# Patient Record
Sex: Male | Born: 1935 | Race: White | Hispanic: No | Marital: Married | Smoking: Never smoker
Health system: Southern US, Community
[De-identification: ages and names within clinical notes are randomized; demographics above are authoritative.]

## PROBLEM LIST (undated history)

## (undated) DIAGNOSIS — N390 Urinary tract infection, site not specified: Secondary | ICD-10-CM

## (undated) DIAGNOSIS — I2510 Atherosclerotic heart disease of native coronary artery without angina pectoris: Secondary | ICD-10-CM

## (undated) DIAGNOSIS — I6523 Occlusion and stenosis of bilateral carotid arteries: Secondary | ICD-10-CM

## (undated) DIAGNOSIS — C8590 Non-Hodgkin lymphoma, unspecified, unspecified site: Secondary | ICD-10-CM

## (undated) DIAGNOSIS — S72002A Fracture of unspecified part of neck of left femur, initial encounter for closed fracture: Secondary | ICD-10-CM

## (undated) DIAGNOSIS — C801 Malignant (primary) neoplasm, unspecified: Secondary | ICD-10-CM

## (undated) DIAGNOSIS — C8313 Mantle cell lymphoma, intra-abdominal lymph nodes: Secondary | ICD-10-CM

## (undated) DIAGNOSIS — N200 Calculus of kidney: Secondary | ICD-10-CM

## (undated) DIAGNOSIS — B9620 Unspecified Escherichia coli [E. coli] as the cause of diseases classified elsewhere: Secondary | ICD-10-CM

## (undated) DIAGNOSIS — R059 Cough, unspecified: Secondary | ICD-10-CM

## (undated) DIAGNOSIS — E669 Obesity, unspecified: Secondary | ICD-10-CM

## (undated) DIAGNOSIS — D62 Acute posthemorrhagic anemia: Secondary | ICD-10-CM

## (undated) DIAGNOSIS — K4020 Bilateral inguinal hernia, without obstruction or gangrene, not specified as recurrent: Secondary | ICD-10-CM

## (undated) DIAGNOSIS — G4730 Sleep apnea, unspecified: Secondary | ICD-10-CM

## (undated) DIAGNOSIS — I071 Rheumatic tricuspid insufficiency: Secondary | ICD-10-CM

## (undated) DIAGNOSIS — E785 Hyperlipidemia, unspecified: Secondary | ICD-10-CM

## (undated) DIAGNOSIS — N19 Unspecified kidney failure: Secondary | ICD-10-CM

## (undated) DIAGNOSIS — C37 Malignant neoplasm of thymus: Secondary | ICD-10-CM

## (undated) DIAGNOSIS — M1990 Unspecified osteoarthritis, unspecified site: Secondary | ICD-10-CM

## (undated) DIAGNOSIS — R55 Syncope and collapse: Secondary | ICD-10-CM

## (undated) DIAGNOSIS — M129 Arthropathy, unspecified: Secondary | ICD-10-CM

## (undated) DIAGNOSIS — Z87898 Personal history of other specified conditions: Secondary | ICD-10-CM

## (undated) DIAGNOSIS — R609 Edema, unspecified: Secondary | ICD-10-CM

## (undated) DIAGNOSIS — I10 Essential (primary) hypertension: Secondary | ICD-10-CM

## (undated) DIAGNOSIS — R05 Cough: Secondary | ICD-10-CM

## (undated) DIAGNOSIS — Z9861 Coronary angioplasty status: Secondary | ICD-10-CM

## (undated) DIAGNOSIS — I5033 Acute on chronic diastolic (congestive) heart failure: Secondary | ICD-10-CM

## (undated) DIAGNOSIS — R578 Other shock: Secondary | ICD-10-CM

## (undated) DIAGNOSIS — I739 Peripheral vascular disease, unspecified: Secondary | ICD-10-CM

## (undated) HISTORY — PX: CARDIAC CATHETERIZATION: SHX172

## (undated) HISTORY — DX: Hyperlipidemia, unspecified: E78.5

## (undated) HISTORY — DX: Rheumatic tricuspid insufficiency: I07.1

## (undated) HISTORY — DX: Sleep apnea, unspecified: G47.30

## (undated) HISTORY — DX: Mantle cell lymphoma, intra-abdominal lymph nodes: C83.13

## (undated) HISTORY — DX: Unspecified kidney failure: N19

## (undated) HISTORY — DX: Calculus of kidney: N20.0

## (undated) HISTORY — DX: Peripheral vascular disease, unspecified: I73.9

## (undated) HISTORY — PX: CORONARY ARTERY BYPASS GRAFT: SHX141

## (undated) HISTORY — DX: Cough, unspecified: R05.9

## (undated) HISTORY — DX: Bilateral inguinal hernia, without obstruction or gangrene, not specified as recurrent: K40.20

## (undated) HISTORY — DX: Arthropathy, unspecified: M12.9

## (undated) HISTORY — DX: Coronary angioplasty status: Z98.61

## (undated) HISTORY — DX: Occlusion and stenosis of bilateral carotid arteries: I65.23

## (undated) HISTORY — DX: Unspecified osteoarthritis, unspecified site: M19.90

## (undated) HISTORY — DX: Malignant neoplasm of thymus: C37

## (undated) HISTORY — DX: Obesity, unspecified: E66.9

## (undated) HISTORY — DX: Essential (primary) hypertension: I10

## (undated) HISTORY — DX: Cough: R05

## (undated) HISTORY — DX: Personal history of other specified conditions: Z87.898

## (undated) HISTORY — DX: Edema, unspecified: R60.9

## (undated) HISTORY — DX: Atherosclerotic heart disease of native coronary artery without angina pectoris: I25.10

---

## 1998-08-04 ENCOUNTER — Ambulatory Visit (HOSPITAL_COMMUNITY): Admission: RE | Admit: 1998-08-04 | Discharge: 1998-08-04 | Payer: Self-pay | Admitting: Gastroenterology

## 2000-06-08 ENCOUNTER — Emergency Department (HOSPITAL_COMMUNITY): Admission: EM | Admit: 2000-06-08 | Discharge: 2000-06-08 | Payer: Self-pay | Admitting: Emergency Medicine

## 2000-06-08 ENCOUNTER — Encounter: Payer: Self-pay | Admitting: Emergency Medicine

## 2001-10-17 ENCOUNTER — Emergency Department (HOSPITAL_COMMUNITY): Admission: EM | Admit: 2001-10-17 | Discharge: 2001-10-18 | Payer: Self-pay | Admitting: Emergency Medicine

## 2002-04-07 ENCOUNTER — Inpatient Hospital Stay (HOSPITAL_COMMUNITY): Admission: EM | Admit: 2002-04-07 | Discharge: 2002-04-19 | Payer: Self-pay | Admitting: Emergency Medicine

## 2002-04-07 ENCOUNTER — Encounter (INDEPENDENT_AMBULATORY_CARE_PROVIDER_SITE_OTHER): Payer: Self-pay

## 2002-04-07 ENCOUNTER — Encounter: Payer: Self-pay | Admitting: Emergency Medicine

## 2002-04-11 ENCOUNTER — Encounter: Payer: Self-pay | Admitting: Cardiothoracic Surgery

## 2002-04-13 ENCOUNTER — Encounter: Payer: Self-pay | Admitting: Cardiothoracic Surgery

## 2002-04-14 ENCOUNTER — Encounter: Payer: Self-pay | Admitting: Cardiothoracic Surgery

## 2002-04-15 ENCOUNTER — Encounter: Payer: Self-pay | Admitting: Cardiothoracic Surgery

## 2002-04-16 ENCOUNTER — Encounter: Payer: Self-pay | Admitting: Cardiothoracic Surgery

## 2002-04-20 ENCOUNTER — Ambulatory Visit: Admission: RE | Admit: 2002-04-20 | Discharge: 2002-07-19 | Payer: Self-pay | Admitting: Radiation Oncology

## 2002-05-07 ENCOUNTER — Encounter: Admission: RE | Admit: 2002-05-07 | Discharge: 2002-05-07 | Payer: Self-pay | Admitting: Cardiothoracic Surgery

## 2002-05-07 ENCOUNTER — Encounter: Payer: Self-pay | Admitting: Cardiothoracic Surgery

## 2002-07-20 ENCOUNTER — Ambulatory Visit: Admission: RE | Admit: 2002-07-20 | Discharge: 2002-07-27 | Payer: Self-pay | Admitting: Radiation Oncology

## 2002-09-01 ENCOUNTER — Ambulatory Visit: Admission: RE | Admit: 2002-09-01 | Discharge: 2002-09-01 | Payer: Self-pay | Admitting: Radiation Oncology

## 2002-11-19 ENCOUNTER — Ambulatory Visit: Admission: RE | Admit: 2002-11-19 | Discharge: 2002-11-19 | Payer: Self-pay | Admitting: Radiation Oncology

## 2002-11-29 ENCOUNTER — Encounter: Payer: Self-pay | Admitting: Radiation Oncology

## 2002-11-29 ENCOUNTER — Ambulatory Visit (HOSPITAL_COMMUNITY): Admission: RE | Admit: 2002-11-29 | Discharge: 2002-11-29 | Payer: Self-pay | Admitting: Radiation Oncology

## 2003-04-08 ENCOUNTER — Encounter: Payer: Self-pay | Admitting: Cardiothoracic Surgery

## 2003-04-08 ENCOUNTER — Encounter: Admission: RE | Admit: 2003-04-08 | Discharge: 2003-04-08 | Payer: Self-pay | Admitting: Cardiothoracic Surgery

## 2003-09-02 ENCOUNTER — Inpatient Hospital Stay (HOSPITAL_COMMUNITY): Admission: EM | Admit: 2003-09-02 | Discharge: 2003-09-07 | Payer: Self-pay | Admitting: Emergency Medicine

## 2003-09-07 ENCOUNTER — Encounter: Payer: Self-pay | Admitting: Cardiothoracic Surgery

## 2004-08-22 ENCOUNTER — Ambulatory Visit: Payer: Self-pay | Admitting: Cardiology

## 2004-09-13 ENCOUNTER — Ambulatory Visit: Payer: Self-pay | Admitting: Family Medicine

## 2004-09-27 ENCOUNTER — Ambulatory Visit: Payer: Self-pay | Admitting: Family Medicine

## 2005-02-04 ENCOUNTER — Ambulatory Visit: Payer: Self-pay | Admitting: Family Medicine

## 2005-02-27 ENCOUNTER — Ambulatory Visit: Payer: Self-pay | Admitting: Cardiology

## 2005-03-06 ENCOUNTER — Ambulatory Visit: Payer: Self-pay | Admitting: Cardiology

## 2005-05-03 ENCOUNTER — Ambulatory Visit: Payer: Self-pay | Admitting: Cardiology

## 2005-05-08 ENCOUNTER — Ambulatory Visit: Payer: Self-pay | Admitting: *Deleted

## 2005-06-28 ENCOUNTER — Ambulatory Visit: Payer: Self-pay | Admitting: Cardiology

## 2005-08-27 ENCOUNTER — Ambulatory Visit: Payer: Self-pay | Admitting: Cardiology

## 2005-09-27 ENCOUNTER — Ambulatory Visit: Payer: Self-pay

## 2005-10-03 ENCOUNTER — Ambulatory Visit: Payer: Self-pay | Admitting: *Deleted

## 2005-11-22 ENCOUNTER — Ambulatory Visit: Payer: Self-pay

## 2005-11-28 ENCOUNTER — Ambulatory Visit: Payer: Self-pay | Admitting: Cardiology

## 2006-04-24 ENCOUNTER — Ambulatory Visit: Payer: Self-pay

## 2006-05-01 ENCOUNTER — Ambulatory Visit: Payer: Self-pay | Admitting: Cardiology

## 2006-08-14 ENCOUNTER — Ambulatory Visit: Payer: Self-pay | Admitting: Cardiology

## 2006-10-23 ENCOUNTER — Ambulatory Visit: Payer: Self-pay

## 2006-10-23 LAB — CONVERTED CEMR LAB
ALT: 106 units/L — ABNORMAL HIGH (ref 0–40)
AST: 119 units/L — ABNORMAL HIGH (ref 0–37)
Albumin: 3.6 g/dL (ref 3.5–5.2)
Alkaline Phosphatase: 125 units/L — ABNORMAL HIGH (ref 39–117)
Bilirubin, Direct: 0.1 mg/dL (ref 0.0–0.3)
Cholesterol: 135 mg/dL (ref 0–200)
HDL: 30.9 mg/dL — ABNORMAL LOW (ref >39.0)
LDL Cholesterol: 87 mg/dL (ref 0–99)
LDL Cholesterol: 87 mg/dL (ref 0–99)
Total Bilirubin: 0.8 mg/dL (ref 0.3–1.2)
Total Bilirubin: 0.8 mg/dL (ref 0.3–1.2)
Total CHOL/HDL Ratio: 4.4
Total Protein: 7 g/dL (ref 6.0–8.3)
Triglycerides: 87 mg/dL (ref 0–149)
Triglycerides: 87 mg/dL (ref 0–149)

## 2006-10-30 ENCOUNTER — Ambulatory Visit: Payer: Self-pay | Admitting: Cardiology

## 2006-10-30 LAB — CONVERTED CEMR LAB
AST: 160 units/L — ABNORMAL HIGH (ref 0–37)
Total Bilirubin: 1.1 mg/dL (ref 0.3–1.2)
Total Protein: 6.9 g/dL (ref 6.0–8.3)

## 2006-11-05 ENCOUNTER — Ambulatory Visit: Payer: Self-pay | Admitting: Cardiology

## 2006-11-05 LAB — CONVERTED CEMR LAB
ALT: 384 units/L — ABNORMAL HIGH (ref 0–40)
AST: 301 units/L — ABNORMAL HIGH (ref 0–37)
Bilirubin, Direct: 0.2 mg/dL (ref 0.0–0.3)
Total Bilirubin: 0.8 mg/dL (ref 0.3–1.2)
Total Protein: 6.6 g/dL (ref 6.0–8.3)

## 2006-11-06 ENCOUNTER — Ambulatory Visit: Payer: Self-pay | Admitting: Gastroenterology

## 2006-11-06 LAB — CONVERTED CEMR LAB
ANA Titer 1: 1:80 {titer} — ABNORMAL HIGH
Albumin: 3.7 g/dL (ref 3.5–5.2)
Creatinine, Ser: 1.3 mg/dL (ref 0.4–1.5)
HCV Ab: NEGATIVE
Hep B S Ab: POSITIVE — AB
Hepatitis B Surface Ag: NEGATIVE

## 2006-11-10 ENCOUNTER — Ambulatory Visit: Payer: Self-pay | Admitting: Internal Medicine

## 2006-11-18 ENCOUNTER — Ambulatory Visit (HOSPITAL_COMMUNITY): Admission: RE | Admit: 2006-11-18 | Discharge: 2006-11-18 | Payer: Self-pay | Admitting: Gastroenterology

## 2006-11-18 ENCOUNTER — Encounter (INDEPENDENT_AMBULATORY_CARE_PROVIDER_SITE_OTHER): Payer: Self-pay | Admitting: *Deleted

## 2006-11-20 ENCOUNTER — Ambulatory Visit: Payer: Self-pay | Admitting: Gastroenterology

## 2006-11-21 ENCOUNTER — Ambulatory Visit (HOSPITAL_COMMUNITY): Admission: RE | Admit: 2006-11-21 | Discharge: 2006-11-21 | Payer: Self-pay | Admitting: Gastroenterology

## 2006-11-24 ENCOUNTER — Encounter: Payer: Self-pay | Admitting: Gastroenterology

## 2006-11-24 ENCOUNTER — Ambulatory Visit (HOSPITAL_COMMUNITY): Admission: RE | Admit: 2006-11-24 | Discharge: 2006-11-24 | Payer: Self-pay | Admitting: Gastroenterology

## 2006-11-24 ENCOUNTER — Encounter (INDEPENDENT_AMBULATORY_CARE_PROVIDER_SITE_OTHER): Payer: Self-pay | Admitting: Specialist

## 2006-11-27 ENCOUNTER — Ambulatory Visit: Payer: Self-pay | Admitting: Oncology

## 2006-12-01 LAB — COMPREHENSIVE METABOLIC PANEL
ALT: 25 U/L (ref 0–53)
Alkaline Phosphatase: 140 U/L — ABNORMAL HIGH (ref 39–117)
CO2: 28 mEq/L (ref 19–32)
Creatinine, Ser: 1.23 mg/dL (ref 0.40–1.50)
Sodium: 142 mEq/L (ref 135–145)
Total Bilirubin: 0.7 mg/dL (ref 0.3–1.2)
Total Protein: 6.8 g/dL (ref 6.0–8.3)

## 2006-12-01 LAB — CBC WITH DIFFERENTIAL/PLATELET
BASO%: 0.7 % (ref 0.0–2.0)
EOS%: 3.4 % (ref 0.0–7.0)
Eosinophils Absolute: 0.3 10*3/uL (ref 0.0–0.5)
LYMPH%: 22.1 % (ref 14.0–48.0)
MCH: 31.6 pg (ref 28.0–33.4)
MCHC: 35 g/dL (ref 32.0–35.9)
MCV: 90.2 fL (ref 81.6–98.0)
MONO%: 7.3 % (ref 0.0–13.0)
Platelets: 191 10*3/uL (ref 145–400)
RBC: 4.21 10*6/uL (ref 4.20–5.71)
RDW: 13.4 % (ref 11.2–14.6)

## 2006-12-01 LAB — LACTATE DEHYDROGENASE: LDH: 152 U/L (ref 94–250)

## 2006-12-01 LAB — CHCC SMEAR

## 2006-12-02 ENCOUNTER — Ambulatory Visit: Payer: Self-pay | Admitting: Gastroenterology

## 2006-12-02 LAB — CONVERTED CEMR LAB
ALT: 27 units/L (ref 0–40)
Albumin: 3 g/dL — ABNORMAL LOW (ref 3.5–5.2)
Alkaline Phosphatase: 122 units/L — ABNORMAL HIGH (ref 39–117)
Total Bilirubin: 0.7 mg/dL (ref 0.3–1.2)
Total Protein: 6.3 g/dL (ref 6.0–8.3)

## 2006-12-03 ENCOUNTER — Other Ambulatory Visit: Admission: RE | Admit: 2006-12-03 | Discharge: 2006-12-03 | Payer: Self-pay | Admitting: Oncology

## 2006-12-03 ENCOUNTER — Encounter (HOSPITAL_COMMUNITY): Payer: Self-pay | Admitting: Oncology

## 2006-12-05 ENCOUNTER — Ambulatory Visit (HOSPITAL_COMMUNITY): Admission: RE | Admit: 2006-12-05 | Discharge: 2006-12-05 | Payer: Self-pay | Admitting: Oncology

## 2006-12-09 ENCOUNTER — Ambulatory Visit: Payer: Self-pay | Admitting: Cardiovascular Disease

## 2006-12-09 ENCOUNTER — Encounter: Payer: Self-pay | Admitting: Cardiovascular Disease

## 2006-12-09 ENCOUNTER — Ambulatory Visit: Admission: RE | Admit: 2006-12-09 | Discharge: 2006-12-09 | Payer: Self-pay | Admitting: Oncology

## 2006-12-10 ENCOUNTER — Ambulatory Visit (HOSPITAL_COMMUNITY): Admission: RE | Admit: 2006-12-10 | Discharge: 2006-12-10 | Payer: Self-pay | Admitting: Oncology

## 2006-12-10 ENCOUNTER — Encounter (HOSPITAL_COMMUNITY): Payer: Self-pay | Admitting: Oncology

## 2006-12-10 ENCOUNTER — Encounter (INDEPENDENT_AMBULATORY_CARE_PROVIDER_SITE_OTHER): Payer: Self-pay | Admitting: Specialist

## 2006-12-15 LAB — CBC WITH DIFFERENTIAL/PLATELET
BASO%: 0.5 % (ref 0.0–2.0)
EOS%: 3.6 % (ref 0.0–7.0)
HCT: 37.8 % — ABNORMAL LOW (ref 38.7–49.9)
MCH: 31.6 pg (ref 28.0–33.4)
MCHC: 35.1 g/dL (ref 32.0–35.9)
MONO#: 0.6 10*3/uL (ref 0.1–0.9)
RBC: 4.21 10*6/uL (ref 4.20–5.71)
RDW: 13.9 % (ref 11.2–14.6)
WBC: 7.3 10*3/uL (ref 4.0–10.0)
lymph#: 1.7 10*3/uL (ref 0.9–3.3)

## 2006-12-15 LAB — COMPREHENSIVE METABOLIC PANEL
ALT: 15 U/L (ref 0–53)
AST: 18 U/L (ref 0–37)
CO2: 27 mEq/L (ref 19–32)
Calcium: 9.2 mg/dL (ref 8.4–10.5)
Chloride: 106 mEq/L (ref 96–112)
Potassium: 5 mEq/L (ref 3.5–5.3)
Sodium: 140 mEq/L (ref 135–145)
Total Protein: 7.1 g/dL (ref 6.0–8.3)

## 2006-12-15 LAB — LACTATE DEHYDROGENASE: LDH: 171 U/L (ref 94–250)

## 2006-12-22 LAB — CBC WITH DIFFERENTIAL/PLATELET
BASO%: 0.6 % (ref 0.0–2.0)
Basophils Absolute: 0.2 10*3/uL — ABNORMAL HIGH (ref 0.0–0.1)
EOS%: 0 % (ref 0.0–7.0)
HGB: 13.1 g/dL (ref 13.0–17.1)
MCH: 31.3 pg (ref 28.0–33.4)
MCHC: 34.7 g/dL (ref 32.0–35.9)
MCV: 90.2 fL (ref 81.6–98.0)
MONO%: 2.5 % (ref 0.0–13.0)
NEUT%: 94.9 % — ABNORMAL HIGH (ref 40.0–75.0)
RDW: 13.8 % (ref 11.2–14.6)
lymph#: 0.6 10*3/uL — ABNORMAL LOW (ref 0.9–3.3)

## 2006-12-29 ENCOUNTER — Ambulatory Visit: Payer: Self-pay | Admitting: Gastroenterology

## 2006-12-29 LAB — CBC WITH DIFFERENTIAL/PLATELET
BASO%: 0 % (ref 0.0–2.0)
EOS%: 1.2 % (ref 0.0–7.0)
HGB: 12.2 g/dL — ABNORMAL LOW (ref 13.0–17.1)
MCH: 31.5 pg (ref 28.0–33.4)
MCV: 91.2 fL (ref 81.6–98.0)
MONO%: 1.1 % (ref 0.0–13.0)
RBC: 3.88 10*6/uL — ABNORMAL LOW (ref 4.20–5.71)
RDW: 14.2 % (ref 11.2–14.6)
lymph#: 1.3 10*3/uL (ref 0.9–3.3)

## 2006-12-29 LAB — COMPREHENSIVE METABOLIC PANEL
ALT: 18 U/L (ref 0–53)
AST: 22 U/L (ref 0–37)
Albumin: 3.6 g/dL (ref 3.5–5.2)
Alkaline Phosphatase: 99 U/L (ref 39–117)
Calcium: 8.5 mg/dL (ref 8.4–10.5)
Chloride: 104 mEq/L (ref 96–112)
Potassium: 4.6 mEq/L (ref 3.5–5.3)
Sodium: 140 mEq/L (ref 135–145)
Total Protein: 6.2 g/dL (ref 6.0–8.3)

## 2006-12-29 LAB — LACTATE DEHYDROGENASE: LDH: 286 U/L — ABNORMAL HIGH (ref 94–250)

## 2006-12-29 LAB — URIC ACID: Uric Acid, Serum: 7.7 mg/dL — ABNORMAL HIGH (ref 2.4–7.0)

## 2007-01-01 ENCOUNTER — Ambulatory Visit: Payer: Self-pay | Admitting: Gastroenterology

## 2007-01-07 ENCOUNTER — Ambulatory Visit: Payer: Self-pay | Admitting: Oncology

## 2007-01-09 LAB — CBC WITH DIFFERENTIAL/PLATELET
BASO%: 0.5 % (ref 0.0–2.0)
EOS%: 2.1 % (ref 0.0–7.0)
MCH: 31.3 pg (ref 28.0–33.4)
MCHC: 34.7 g/dL (ref 32.0–35.9)
RDW: 14.1 % (ref 11.2–14.6)
lymph#: 1.2 10*3/uL (ref 0.9–3.3)

## 2007-01-09 LAB — COMPREHENSIVE METABOLIC PANEL
ALT: 15 U/L (ref 0–53)
AST: 19 U/L (ref 0–37)
Albumin: 3.9 g/dL (ref 3.5–5.2)
Alkaline Phosphatase: 81 U/L (ref 39–117)
Calcium: 8.9 mg/dL (ref 8.4–10.5)
Chloride: 106 mEq/L (ref 96–112)
Creatinine, Ser: 1.11 mg/dL (ref 0.40–1.50)
Potassium: 4.2 mEq/L (ref 3.5–5.3)

## 2007-01-16 LAB — CBC WITH DIFFERENTIAL/PLATELET
Basophils Absolute: 0 10*3/uL (ref 0.0–0.1)
Eosinophils Absolute: 0.2 10*3/uL (ref 0.0–0.5)
HGB: 11 g/dL — ABNORMAL LOW (ref 13.0–17.1)
MONO#: 0.4 10*3/uL (ref 0.1–0.9)
NEUT#: 0.4 10*3/uL — CL (ref 1.5–6.5)
RBC: 3.46 10*6/uL — ABNORMAL LOW (ref 4.20–5.71)
RDW: 14.3 % (ref 11.2–14.6)
WBC: 1.8 10*3/uL — ABNORMAL LOW (ref 4.0–10.0)

## 2007-01-17 ENCOUNTER — Ambulatory Visit: Payer: Self-pay | Admitting: Vascular Surgery

## 2007-01-17 ENCOUNTER — Encounter (INDEPENDENT_AMBULATORY_CARE_PROVIDER_SITE_OTHER): Payer: Self-pay | Admitting: Emergency Medicine

## 2007-01-17 ENCOUNTER — Emergency Department (HOSPITAL_COMMUNITY): Admission: EM | Admit: 2007-01-17 | Discharge: 2007-01-17 | Payer: Self-pay | Admitting: Emergency Medicine

## 2007-01-23 LAB — CBC WITH DIFFERENTIAL/PLATELET
Basophils Absolute: 0 10*3/uL (ref 0.0–0.1)
Eosinophils Absolute: 0.2 10*3/uL (ref 0.0–0.5)
HCT: 33.4 % — ABNORMAL LOW (ref 38.7–49.9)
HGB: 11.5 g/dL — ABNORMAL LOW (ref 13.0–17.1)
LYMPH%: 8.5 % — ABNORMAL LOW (ref 14.0–48.0)
MONO#: 0.4 10*3/uL (ref 0.1–0.9)
NEUT#: 12.3 10*3/uL — ABNORMAL HIGH (ref 1.5–6.5)
NEUT%: 87.1 % — ABNORMAL HIGH (ref 40.0–75.0)
Platelets: 206 10*3/uL (ref 145–400)
WBC: 14.2 10*3/uL — ABNORMAL HIGH (ref 4.0–10.0)

## 2007-01-30 LAB — CBC WITH DIFFERENTIAL/PLATELET
BASO%: 1.7 % (ref 0.0–2.0)
HCT: 34.6 % — ABNORMAL LOW (ref 38.7–49.9)
LYMPH%: 16.5 % (ref 14.0–48.0)
MCH: 31.8 pg (ref 28.0–33.4)
MCHC: 35 g/dL (ref 32.0–35.9)
MCV: 90.6 fL (ref 81.6–98.0)
MONO#: 0.7 10*3/uL (ref 0.1–0.9)
NEUT%: 70.4 % (ref 40.0–75.0)
Platelets: 284 10*3/uL (ref 145–400)
WBC: 7.4 10*3/uL (ref 4.0–10.0)

## 2007-01-30 LAB — COMPREHENSIVE METABOLIC PANEL
ALT: 10 U/L (ref 0–53)
CO2: 23 mEq/L (ref 19–32)
Creatinine, Ser: 1.08 mg/dL (ref 0.40–1.50)
Total Bilirubin: 0.4 mg/dL (ref 0.3–1.2)

## 2007-01-30 LAB — URIC ACID: Uric Acid, Serum: 4.9 mg/dL (ref 2.4–7.0)

## 2007-01-30 LAB — LACTATE DEHYDROGENASE: LDH: 145 U/L (ref 94–250)

## 2007-02-06 LAB — CBC WITH DIFFERENTIAL/PLATELET
Basophils Absolute: 0 10*3/uL (ref 0.0–0.1)
EOS%: 5.8 % (ref 0.0–7.0)
Eosinophils Absolute: 0.2 10*3/uL (ref 0.0–0.5)
HCT: 30.5 % — ABNORMAL LOW (ref 38.7–49.9)
HGB: 10.7 g/dL — ABNORMAL LOW (ref 13.0–17.1)
MCH: 32.3 pg (ref 28.0–33.4)
NEUT#: 1.7 10*3/uL (ref 1.5–6.5)
NEUT%: 53.7 % (ref 40.0–75.0)
RDW: 15.6 % — ABNORMAL HIGH (ref 11.2–14.6)
lymph#: 0.8 10*3/uL — ABNORMAL LOW (ref 0.9–3.3)

## 2007-02-13 LAB — CBC WITH DIFFERENTIAL/PLATELET
Basophils Absolute: 0 10*3/uL (ref 0.0–0.1)
EOS%: 3 % (ref 0.0–7.0)
Eosinophils Absolute: 0.3 10*3/uL (ref 0.0–0.5)
HCT: 32.5 % — ABNORMAL LOW (ref 38.7–49.9)
HGB: 11.2 g/dL — ABNORMAL LOW (ref 13.0–17.1)
LYMPH%: 12.3 % — ABNORMAL LOW (ref 14.0–48.0)
MCH: 32.1 pg (ref 28.0–33.4)
MCV: 92.6 fL (ref 81.6–98.0)
MONO%: 6.5 % (ref 0.0–13.0)
NEUT#: 7.3 10*3/uL — ABNORMAL HIGH (ref 1.5–6.5)
NEUT%: 78 % — ABNORMAL HIGH (ref 40.0–75.0)
Platelets: 268 10*3/uL (ref 145–400)

## 2007-02-17 ENCOUNTER — Ambulatory Visit: Payer: Self-pay | Admitting: Oncology

## 2007-02-20 LAB — COMPREHENSIVE METABOLIC PANEL
AST: 14 U/L (ref 0–37)
Alkaline Phosphatase: 80 U/L (ref 39–117)
BUN: 24 mg/dL — ABNORMAL HIGH (ref 6–23)
Glucose, Bld: 118 mg/dL — ABNORMAL HIGH (ref 70–99)
Sodium: 141 mEq/L (ref 135–145)
Total Bilirubin: 0.4 mg/dL (ref 0.3–1.2)
Total Protein: 6 g/dL (ref 6.0–8.3)

## 2007-02-20 LAB — CBC WITH DIFFERENTIAL/PLATELET
EOS%: 4.6 % (ref 0.0–7.0)
Eosinophils Absolute: 0.3 10*3/uL (ref 0.0–0.5)
LYMPH%: 21.4 % (ref 14.0–48.0)
MCH: 32.1 pg (ref 28.0–33.4)
MCV: 90.8 fL (ref 81.6–98.0)
MONO%: 9 % (ref 0.0–13.0)
NEUT#: 4.2 10*3/uL (ref 1.5–6.5)
Platelets: 246 10*3/uL (ref 145–400)
RBC: 3.74 10*6/uL — ABNORMAL LOW (ref 4.20–5.71)
RDW: 14.3 % (ref 11.2–14.6)

## 2007-02-26 LAB — CBC WITH DIFFERENTIAL/PLATELET
Eosinophils Absolute: 0.1 10*3/uL (ref 0.0–0.5)
LYMPH%: 14.7 % (ref 14.0–48.0)
MONO#: 0.4 10*3/uL (ref 0.1–0.9)
NEUT#: 4.4 10*3/uL (ref 1.5–6.5)
Platelets: 150 10*3/uL (ref 145–400)
RBC: 3.05 10*6/uL — ABNORMAL LOW (ref 4.20–5.71)
WBC: 5.8 10*3/uL (ref 4.0–10.0)

## 2007-03-06 LAB — CBC WITH DIFFERENTIAL/PLATELET
Basophils Absolute: 0.1 10*3/uL (ref 0.0–0.1)
Eosinophils Absolute: 0.3 10*3/uL (ref 0.0–0.5)
HCT: 34.4 % — ABNORMAL LOW (ref 38.7–49.9)
LYMPH%: 12.1 % — ABNORMAL LOW (ref 14.0–48.0)
MONO#: 0.5 10*3/uL (ref 0.1–0.9)
NEUT#: 10 10*3/uL — ABNORMAL HIGH (ref 1.5–6.5)
NEUT%: 81.1 % — ABNORMAL HIGH (ref 40.0–75.0)
Platelets: 267 10*3/uL (ref 145–400)
WBC: 12.4 10*3/uL — ABNORMAL HIGH (ref 4.0–10.0)

## 2007-03-09 ENCOUNTER — Ambulatory Visit (HOSPITAL_COMMUNITY): Admission: RE | Admit: 2007-03-09 | Discharge: 2007-03-09 | Payer: Self-pay | Admitting: Oncology

## 2007-03-11 LAB — CBC WITH DIFFERENTIAL/PLATELET
BASO%: 1.1 % (ref 0.0–2.0)
EOS%: 3.3 % (ref 0.0–7.0)
HCT: 34 % — ABNORMAL LOW (ref 38.7–49.9)
LYMPH%: 17.4 % (ref 14.0–48.0)
MCH: 32 pg (ref 28.0–33.4)
MCHC: 34.3 g/dL (ref 32.0–35.9)
NEUT%: 71.3 % (ref 40.0–75.0)
Platelets: 288 10*3/uL (ref 145–400)

## 2007-03-11 LAB — LACTATE DEHYDROGENASE: LDH: 159 U/L (ref 94–250)

## 2007-03-11 LAB — COMPREHENSIVE METABOLIC PANEL
ALT: 15 U/L (ref 0–53)
AST: 15 U/L (ref 0–37)
BUN: 22 mg/dL (ref 6–23)
CO2: 23 mEq/L (ref 19–32)
Creatinine, Ser: 1.12 mg/dL (ref 0.40–1.50)
Total Bilirubin: 0.7 mg/dL (ref 0.3–1.2)

## 2007-03-11 LAB — URIC ACID: Uric Acid, Serum: 6.2 mg/dL (ref 2.4–7.0)

## 2007-03-20 LAB — CBC WITH DIFFERENTIAL/PLATELET
EOS%: 12.7 % — ABNORMAL HIGH (ref 0.0–7.0)
MCH: 32.8 pg (ref 28.0–33.4)
MCV: 94.4 fL (ref 81.6–98.0)
MONO%: 19.9 % — ABNORMAL HIGH (ref 0.0–13.0)
NEUT#: 0.4 10*3/uL — CL (ref 1.5–6.5)
RBC: 3.22 10*6/uL — ABNORMAL LOW (ref 4.20–5.71)
RDW: 15.9 % — ABNORMAL HIGH (ref 11.2–14.6)

## 2007-03-27 LAB — CBC WITH DIFFERENTIAL/PLATELET
Eosinophils Absolute: 0.2 10*3/uL (ref 0.0–0.5)
MONO#: 0.5 10*3/uL (ref 0.1–0.9)
MONO%: 5.6 % (ref 0.0–13.0)
NEUT#: 6.8 10*3/uL — ABNORMAL HIGH (ref 1.5–6.5)
RBC: 3.45 10*6/uL — ABNORMAL LOW (ref 4.20–5.71)
RDW: 16 % — ABNORMAL HIGH (ref 11.2–14.6)
WBC: 8.6 10*3/uL (ref 4.0–10.0)
lymph#: 1.1 10*3/uL (ref 0.9–3.3)

## 2007-03-30 ENCOUNTER — Ambulatory Visit: Payer: Self-pay | Admitting: Oncology

## 2007-04-01 LAB — CBC WITH DIFFERENTIAL/PLATELET
Eosinophils Absolute: 0.1 10*3/uL (ref 0.0–0.5)
HCT: 32.2 % — ABNORMAL LOW (ref 38.7–49.9)
LYMPH%: 15.8 % (ref 14.0–48.0)
MONO#: 0.4 10*3/uL (ref 0.1–0.9)
NEUT#: 5 10*3/uL (ref 1.5–6.5)
Platelets: 308 10*3/uL (ref 145–400)
RBC: 3.43 10*6/uL — ABNORMAL LOW (ref 4.20–5.71)
WBC: 6.7 10*3/uL (ref 4.0–10.0)

## 2007-04-01 LAB — COMPREHENSIVE METABOLIC PANEL
Albumin: 3.5 g/dL (ref 3.5–5.2)
CO2: 25 mEq/L (ref 19–32)
Glucose, Bld: 163 mg/dL — ABNORMAL HIGH (ref 70–99)
Sodium: 141 mEq/L (ref 135–145)
Total Bilirubin: 0.7 mg/dL (ref 0.3–1.2)
Total Protein: 5.9 g/dL — ABNORMAL LOW (ref 6.0–8.3)

## 2007-04-01 LAB — LACTATE DEHYDROGENASE: LDH: 161 U/L (ref 94–250)

## 2007-04-10 LAB — CBC WITH DIFFERENTIAL/PLATELET
Basophils Absolute: 0 10*3/uL (ref 0.0–0.1)
Eosinophils Absolute: 0.1 10*3/uL (ref 0.0–0.5)
HGB: 10.8 g/dL — ABNORMAL LOW (ref 13.0–17.1)
MONO#: 0.8 10*3/uL (ref 0.1–0.9)
MONO%: 16.2 % — ABNORMAL HIGH (ref 0.0–13.0)
NEUT#: 3.1 10*3/uL (ref 1.5–6.5)
RBC: 3.29 10*6/uL — ABNORMAL LOW (ref 4.20–5.71)
RDW: 15.6 % — ABNORMAL HIGH (ref 11.2–14.6)
WBC: 5 10*3/uL (ref 4.0–10.0)
lymph#: 1 10*3/uL (ref 0.9–3.3)

## 2007-04-17 LAB — CBC WITH DIFFERENTIAL/PLATELET
Basophils Absolute: 0 10*3/uL (ref 0.0–0.1)
Eosinophils Absolute: 0.2 10*3/uL (ref 0.0–0.5)
HCT: 36.3 % — ABNORMAL LOW (ref 38.7–49.9)
HGB: 12.6 g/dL — ABNORMAL LOW (ref 13.0–17.1)
LYMPH%: 9.9 % — ABNORMAL LOW (ref 14.0–48.0)
MONO#: 0.7 10*3/uL (ref 0.1–0.9)
NEUT#: 10 10*3/uL — ABNORMAL HIGH (ref 1.5–6.5)
Platelets: 255 10*3/uL (ref 145–400)
RBC: 3.86 10*6/uL — ABNORMAL LOW (ref 4.20–5.71)
WBC: 12.2 10*3/uL — ABNORMAL HIGH (ref 4.0–10.0)

## 2007-04-24 LAB — COMPREHENSIVE METABOLIC PANEL
Albumin: 4 g/dL (ref 3.5–5.2)
BUN: 16 mg/dL (ref 6–23)
CO2: 26 mEq/L (ref 19–32)
Glucose, Bld: 97 mg/dL (ref 70–99)
Sodium: 140 mEq/L (ref 135–145)
Total Bilirubin: 0.4 mg/dL (ref 0.3–1.2)
Total Protein: 6.4 g/dL (ref 6.0–8.3)

## 2007-04-24 LAB — CBC WITH DIFFERENTIAL/PLATELET
BASO%: 1.7 % (ref 0.0–2.0)
HCT: 36.5 % — ABNORMAL LOW (ref 38.7–49.9)
MCHC: 34.1 g/dL (ref 32.0–35.9)
MONO#: 0.7 10*3/uL (ref 0.1–0.9)
NEUT#: 4.3 10*3/uL (ref 1.5–6.5)
RBC: 3.92 10*6/uL — ABNORMAL LOW (ref 4.20–5.71)
WBC: 6.9 10*3/uL (ref 4.0–10.0)
lymph#: 1.6 10*3/uL (ref 0.9–3.3)

## 2007-04-24 LAB — LACTATE DEHYDROGENASE: LDH: 178 U/L (ref 94–250)

## 2007-05-01 LAB — CBC WITH DIFFERENTIAL/PLATELET
BASO%: 0.2 % (ref 0.0–2.0)
HCT: 30.8 % — ABNORMAL LOW (ref 38.7–49.9)
LYMPH%: 18.1 % (ref 14.0–48.0)
MCHC: 35.1 g/dL (ref 32.0–35.9)
MCV: 94.1 fL (ref 81.6–98.0)
MONO#: 0.9 10*3/uL (ref 0.1–0.9)
MONO%: 19 % — ABNORMAL HIGH (ref 0.0–13.0)
NEUT%: 54.3 % (ref 40.0–75.0)
Platelets: 137 10*3/uL — ABNORMAL LOW (ref 145–400)
RBC: 3.28 10*6/uL — ABNORMAL LOW (ref 4.20–5.71)
WBC: 4.9 10*3/uL (ref 4.0–10.0)

## 2007-05-08 ENCOUNTER — Ambulatory Visit: Payer: Self-pay | Admitting: Gastroenterology

## 2007-05-08 LAB — CBC WITH DIFFERENTIAL/PLATELET
BASO%: 0.1 % (ref 0.0–2.0)
EOS%: 1.5 % (ref 0.0–7.0)
HCT: 34.7 % — ABNORMAL LOW (ref 38.7–49.9)
LYMPH%: 12.2 % — ABNORMAL LOW (ref 14.0–48.0)
MCH: 31.9 pg (ref 28.0–33.4)
MCHC: 34.1 g/dL (ref 32.0–35.9)
MONO#: 0.4 10*3/uL (ref 0.1–0.9)
NEUT%: 80.6 % — ABNORMAL HIGH (ref 40.0–75.0)
Platelets: 254 10*3/uL (ref 145–400)
RBC: 3.71 10*6/uL — ABNORMAL LOW (ref 4.20–5.71)
WBC: 7.4 10*3/uL (ref 4.0–10.0)

## 2007-05-15 ENCOUNTER — Ambulatory Visit: Payer: Self-pay | Admitting: Oncology

## 2007-05-15 LAB — CBC WITH DIFFERENTIAL/PLATELET
BASO%: 1.6 % (ref 0.0–2.0)
EOS%: 2.3 % (ref 0.0–7.0)
MCH: 31.9 pg (ref 28.0–33.4)
MCHC: 34.6 g/dL (ref 32.0–35.9)
MONO#: 0.5 10*3/uL (ref 0.1–0.9)
RBC: 4.05 10*6/uL — ABNORMAL LOW (ref 4.20–5.71)
RDW: 13.3 % (ref 11.2–14.6)
WBC: 5.5 10*3/uL (ref 4.0–10.0)
lymph#: 1.4 10*3/uL (ref 0.9–3.3)

## 2007-05-15 LAB — COMPREHENSIVE METABOLIC PANEL
ALT: 14 U/L (ref 0–53)
AST: 18 U/L (ref 0–37)
Albumin: 3.7 g/dL (ref 3.5–5.2)
CO2: 24 mEq/L (ref 19–32)
Calcium: 8.8 mg/dL (ref 8.4–10.5)
Chloride: 109 mEq/L (ref 96–112)
Creatinine, Ser: 0.99 mg/dL (ref 0.40–1.50)
Potassium: 4.3 mEq/L (ref 3.5–5.3)
Sodium: 143 mEq/L (ref 135–145)
Total Protein: 6.4 g/dL (ref 6.0–8.3)

## 2007-05-22 LAB — CBC WITH DIFFERENTIAL/PLATELET
BASO%: 0.2 % (ref 0.0–2.0)
EOS%: 11.6 % — ABNORMAL HIGH (ref 0.0–7.0)
MCH: 32.2 pg (ref 28.0–33.4)
MCHC: 34.8 g/dL (ref 32.0–35.9)
MCV: 92.5 fL (ref 81.6–98.0)
MONO%: 30.8 % — ABNORMAL HIGH (ref 0.0–13.0)
RBC: 3.53 10*6/uL — ABNORMAL LOW (ref 4.20–5.71)
RDW: 15.3 % — ABNORMAL HIGH (ref 11.2–14.6)
lymph#: 0.7 10*3/uL — ABNORMAL LOW (ref 0.9–3.3)

## 2007-05-29 LAB — CBC WITH DIFFERENTIAL/PLATELET
Eosinophils Absolute: 0.1 10*3/uL (ref 0.0–0.5)
HGB: 12.1 g/dL — ABNORMAL LOW (ref 13.0–17.1)
MCV: 92.7 fL (ref 81.6–98.0)
MONO#: 0.6 10*3/uL (ref 0.1–0.9)
MONO%: 7.2 % (ref 0.0–13.0)
NEUT#: 6 10*3/uL (ref 1.5–6.5)
RBC: 3.79 10*6/uL — ABNORMAL LOW (ref 4.20–5.71)
RDW: 15.4 % — ABNORMAL HIGH (ref 11.2–14.6)
WBC: 7.8 10*3/uL (ref 4.0–10.0)
lymph#: 1.1 10*3/uL (ref 0.9–3.3)

## 2007-06-05 LAB — CBC WITH DIFFERENTIAL/PLATELET
BASO%: 0.5 % (ref 0.0–2.0)
MCHC: 35 g/dL (ref 32.0–35.9)
MONO#: 0.5 10*3/uL (ref 0.1–0.9)
RBC: 3.85 10*6/uL — ABNORMAL LOW (ref 4.20–5.71)
RDW: 15 % — ABNORMAL HIGH (ref 11.2–14.6)
WBC: 4.8 10*3/uL (ref 4.0–10.0)
lymph#: 1.1 10*3/uL (ref 0.9–3.3)

## 2007-06-08 ENCOUNTER — Ambulatory Visit (HOSPITAL_COMMUNITY): Admission: RE | Admit: 2007-06-08 | Discharge: 2007-06-08 | Payer: Self-pay | Admitting: Oncology

## 2007-06-11 LAB — COMPREHENSIVE METABOLIC PANEL
Albumin: 3.9 g/dL (ref 3.5–5.2)
CO2: 26 mEq/L (ref 19–32)
Glucose, Bld: 85 mg/dL (ref 70–99)
Potassium: 3.8 mEq/L (ref 3.5–5.3)
Sodium: 143 mEq/L (ref 135–145)
Total Protein: 6.2 g/dL (ref 6.0–8.3)

## 2007-06-11 LAB — LACTATE DEHYDROGENASE: LDH: 200 U/L (ref 94–250)

## 2007-06-11 LAB — CBC WITH DIFFERENTIAL/PLATELET
Basophils Absolute: 0.1 10*3/uL (ref 0.0–0.1)
Eosinophils Absolute: 0.1 10*3/uL (ref 0.0–0.5)
HGB: 12.9 g/dL — ABNORMAL LOW (ref 13.0–17.1)
LYMPH%: 32.6 % (ref 14.0–48.0)
MONO#: 0.6 10*3/uL (ref 0.1–0.9)
NEUT#: 2.4 10*3/uL (ref 1.5–6.5)
Platelets: 229 10*3/uL (ref 145–400)
RBC: 4.07 10*6/uL — ABNORMAL LOW (ref 4.20–5.71)
WBC: 4.7 10*3/uL (ref 4.0–10.0)

## 2007-06-16 ENCOUNTER — Ambulatory Visit (HOSPITAL_COMMUNITY): Admission: RE | Admit: 2007-06-16 | Discharge: 2007-06-16 | Payer: Self-pay | Admitting: Gastroenterology

## 2007-06-16 ENCOUNTER — Encounter: Payer: Self-pay | Admitting: Gastroenterology

## 2007-06-24 ENCOUNTER — Encounter (HOSPITAL_COMMUNITY): Payer: Self-pay | Admitting: Oncology

## 2007-06-24 ENCOUNTER — Other Ambulatory Visit: Admission: RE | Admit: 2007-06-24 | Discharge: 2007-06-24 | Payer: Self-pay | Admitting: Oncology

## 2007-06-29 ENCOUNTER — Ambulatory Visit: Payer: Self-pay | Admitting: Gastroenterology

## 2007-07-09 ENCOUNTER — Ambulatory Visit: Payer: Self-pay | Admitting: Oncology

## 2007-07-13 LAB — COMPREHENSIVE METABOLIC PANEL
ALT: 10 U/L (ref 0–53)
AST: 14 U/L (ref 0–37)
Albumin: 3.8 g/dL (ref 3.5–5.2)
Alkaline Phosphatase: 81 U/L (ref 39–117)
Chloride: 106 mEq/L (ref 96–112)
Potassium: 4.7 mEq/L (ref 3.5–5.3)
Sodium: 145 mEq/L (ref 135–145)
Total Protein: 6.4 g/dL (ref 6.0–8.3)

## 2007-07-13 LAB — CBC WITH DIFFERENTIAL/PLATELET
BASO%: 0.4 % (ref 0.0–2.0)
EOS%: 4.1 % (ref 0.0–7.0)
MCH: 30.8 pg (ref 28.0–33.4)
MCV: 90.9 fL (ref 81.6–98.0)
MONO%: 5.6 % (ref 0.0–13.0)
RBC: 4.33 10*6/uL (ref 4.20–5.71)
RDW: 13.3 % (ref 11.2–14.6)
lymph#: 1.3 10*3/uL (ref 0.9–3.3)

## 2007-08-06 ENCOUNTER — Ambulatory Visit: Payer: Self-pay | Admitting: Cardiology

## 2007-08-06 LAB — CONVERTED CEMR LAB
BUN: 14 mg/dL (ref 6–23)
CO2: 30 meq/L (ref 19–32)
Chloride: 107 meq/L (ref 96–112)
Creatinine, Ser: 1 mg/dL (ref 0.4–1.5)
GFR calc non Af Amer: 78 mL/min
Glucose, Bld: 86 mg/dL (ref 70–99)
Potassium: 4.1 meq/L (ref 3.5–5.1)
Sodium: 143 meq/L (ref 135–145)

## 2007-08-13 LAB — CBC WITH DIFFERENTIAL/PLATELET
EOS%: 5.3 % (ref 0.0–7.0)
Eosinophils Absolute: 0.3 10*3/uL (ref 0.0–0.5)
LYMPH%: 32.7 % (ref 14.0–48.0)
MCH: 30.2 pg (ref 28.0–33.4)
MCV: 89.5 fL (ref 81.6–98.0)
MONO%: 11.9 % (ref 0.0–13.0)
NEUT#: 2.9 10*3/uL (ref 1.5–6.5)
Platelets: 243 10*3/uL (ref 145–400)
RBC: 4.66 10*6/uL (ref 4.20–5.71)

## 2007-08-13 LAB — COMPREHENSIVE METABOLIC PANEL
Alkaline Phosphatase: 80 U/L (ref 39–117)
BUN: 20 mg/dL (ref 6–23)
Glucose, Bld: 166 mg/dL — ABNORMAL HIGH (ref 70–99)
Sodium: 140 mEq/L (ref 135–145)
Total Bilirubin: 0.4 mg/dL (ref 0.3–1.2)
Total Protein: 5.9 g/dL — ABNORMAL LOW (ref 6.0–8.3)

## 2007-08-13 LAB — LACTATE DEHYDROGENASE: LDH: 155 U/L (ref 94–250)

## 2007-08-17 ENCOUNTER — Ambulatory Visit: Payer: Self-pay | Admitting: Cardiology

## 2007-08-17 LAB — CONVERTED CEMR LAB
BUN: 17 mg/dL (ref 6–23)
CO2: 30 meq/L (ref 19–32)
Calcium: 8.9 mg/dL (ref 8.4–10.5)
Sodium: 139 meq/L (ref 135–145)

## 2007-09-08 ENCOUNTER — Ambulatory Visit: Payer: Self-pay | Admitting: Oncology

## 2007-09-10 LAB — COMPREHENSIVE METABOLIC PANEL
ALT: 11 U/L (ref 0–53)
CO2: 25 mEq/L (ref 19–32)
Calcium: 8.8 mg/dL (ref 8.4–10.5)
Chloride: 105 mEq/L (ref 96–112)
Sodium: 140 mEq/L (ref 135–145)
Total Bilirubin: 0.1 mg/dL — ABNORMAL LOW (ref 0.3–1.2)
Total Protein: 5.8 g/dL — ABNORMAL LOW (ref 6.0–8.3)

## 2007-09-10 LAB — CBC WITH DIFFERENTIAL/PLATELET
BASO%: 2.4 % — ABNORMAL HIGH (ref 0.0–2.0)
EOS%: 6.5 % (ref 0.0–7.0)
HCT: 42.6 % (ref 38.7–49.9)
MCH: 30.2 pg (ref 28.0–33.4)
MCHC: 34.4 g/dL (ref 32.0–35.9)
MCV: 87.8 fL (ref 81.6–98.0)
MONO%: 17.2 % — ABNORMAL HIGH (ref 0.0–13.0)
NEUT%: 35.5 % — ABNORMAL LOW (ref 40.0–75.0)
lymph#: 1.5 10*3/uL (ref 0.9–3.3)

## 2007-09-10 LAB — LACTATE DEHYDROGENASE: LDH: 199 U/L (ref 94–250)

## 2007-09-16 ENCOUNTER — Ambulatory Visit: Payer: Self-pay | Admitting: Cardiology

## 2007-09-18 ENCOUNTER — Ambulatory Visit: Payer: Self-pay

## 2007-10-08 LAB — CBC WITH DIFFERENTIAL/PLATELET
Eosinophils Absolute: 0.4 10*3/uL (ref 0.0–0.5)
HCT: 39.4 % (ref 38.7–49.9)
LYMPH%: 33.3 % (ref 14.0–48.0)
MONO#: 0.6 10*3/uL (ref 0.1–0.9)
NEUT#: 2.2 10*3/uL (ref 1.5–6.5)
NEUT%: 45.4 % (ref 40.0–75.0)
Platelets: 239 10*3/uL (ref 145–400)
WBC: 4.9 10*3/uL (ref 4.0–10.0)

## 2007-10-08 LAB — COMPREHENSIVE METABOLIC PANEL
Alkaline Phosphatase: 67 U/L (ref 39–117)
BUN: 16 mg/dL (ref 6–23)
Glucose, Bld: 112 mg/dL — ABNORMAL HIGH (ref 70–99)
Sodium: 143 mEq/L (ref 135–145)
Total Bilirubin: 0.5 mg/dL (ref 0.3–1.2)

## 2007-11-03 ENCOUNTER — Ambulatory Visit: Payer: Self-pay | Admitting: Oncology

## 2007-11-05 LAB — CBC WITH DIFFERENTIAL/PLATELET
Basophils Absolute: 0.1 10*3/uL (ref 0.0–0.1)
HCT: 42.7 % (ref 38.7–49.9)
HGB: 14.5 g/dL (ref 13.0–17.1)
LYMPH%: 26 % (ref 14.0–48.0)
MCH: 30.1 pg (ref 28.0–33.4)
MCHC: 33.9 g/dL (ref 32.0–35.9)
MONO#: 0.8 10*3/uL (ref 0.1–0.9)
MONO%: 9.5 % (ref 0.0–13.0)
NEUT%: 59.1 % (ref 40.0–75.0)
Platelets: 228 10*3/uL (ref 145–400)

## 2007-11-05 LAB — COMPREHENSIVE METABOLIC PANEL
AST: 14 U/L (ref 0–37)
Albumin: 4 g/dL (ref 3.5–5.2)
Alkaline Phosphatase: 66 U/L (ref 39–117)
Chloride: 107 mEq/L (ref 96–112)
Glucose, Bld: 105 mg/dL — ABNORMAL HIGH (ref 70–99)
Potassium: 4.1 mEq/L (ref 3.5–5.3)
Sodium: 143 mEq/L (ref 135–145)
Total Protein: 6.3 g/dL (ref 6.0–8.3)

## 2007-11-12 DIAGNOSIS — I10 Essential (primary) hypertension: Secondary | ICD-10-CM

## 2007-11-12 DIAGNOSIS — C8318 Mantle cell lymphoma, lymph nodes of multiple sites: Secondary | ICD-10-CM

## 2007-12-03 LAB — LACTATE DEHYDROGENASE: LDH: 203 U/L (ref 94–250)

## 2007-12-03 LAB — CBC WITH DIFFERENTIAL/PLATELET
Basophils Absolute: 0.1 10*3/uL (ref 0.0–0.1)
Eosinophils Absolute: 0.4 10*3/uL (ref 0.0–0.5)
HCT: 44.7 % (ref 38.7–49.9)
HGB: 15 g/dL (ref 13.0–17.1)
LYMPH%: 27.3 % (ref 14.0–48.0)
MCV: 92.4 fL (ref 81.6–98.0)
MONO%: 8.8 % (ref 0.0–13.0)
NEUT#: 4.5 10*3/uL (ref 1.5–6.5)
NEUT%: 57.2 % (ref 40.0–75.0)
Platelets: 201 10*3/uL (ref 145–400)

## 2007-12-03 LAB — COMPREHENSIVE METABOLIC PANEL
Alkaline Phosphatase: 69 U/L (ref 39–117)
BUN: 18 mg/dL (ref 6–23)
Glucose, Bld: 91 mg/dL (ref 70–99)
Total Bilirubin: 0.5 mg/dL (ref 0.3–1.2)

## 2007-12-28 ENCOUNTER — Ambulatory Visit (HOSPITAL_COMMUNITY): Admission: RE | Admit: 2007-12-28 | Discharge: 2007-12-28 | Payer: Self-pay | Admitting: Oncology

## 2007-12-29 ENCOUNTER — Ambulatory Visit: Payer: Self-pay | Admitting: Oncology

## 2007-12-31 ENCOUNTER — Encounter: Payer: Self-pay | Admitting: Gastroenterology

## 2007-12-31 LAB — CBC WITH DIFFERENTIAL/PLATELET
Basophils Absolute: 0.1 10*3/uL (ref 0.0–0.1)
EOS%: 7.8 % — ABNORMAL HIGH (ref 0.0–7.0)
HGB: 14.1 g/dL (ref 13.0–17.1)
MCH: 30.6 pg (ref 28.0–33.4)
NEUT#: 1 10*3/uL — ABNORMAL LOW (ref 1.5–6.5)
RBC: 4.6 10*6/uL (ref 4.20–5.71)
RDW: 13.5 % (ref 11.2–14.6)
lymph#: 1.6 10*3/uL (ref 0.9–3.3)

## 2007-12-31 LAB — COMPREHENSIVE METABOLIC PANEL
AST: 17 U/L (ref 0–37)
Albumin: 3.8 g/dL (ref 3.5–5.2)
Alkaline Phosphatase: 53 U/L (ref 39–117)
BUN: 21 mg/dL (ref 6–23)
Creatinine, Ser: 1.05 mg/dL (ref 0.40–1.50)
Potassium: 4.5 mEq/L (ref 3.5–5.3)

## 2008-02-16 ENCOUNTER — Ambulatory Visit: Payer: Self-pay | Admitting: Oncology

## 2008-02-18 ENCOUNTER — Encounter: Payer: Self-pay | Admitting: Gastroenterology

## 2008-02-18 LAB — COMPREHENSIVE METABOLIC PANEL
ALT: 13 U/L (ref 0–53)
Albumin: 4.1 g/dL (ref 3.5–5.2)
CO2: 25 mEq/L (ref 19–32)
Calcium: 9 mg/dL (ref 8.4–10.5)
Chloride: 106 mEq/L (ref 96–112)
Potassium: 4.1 mEq/L (ref 3.5–5.3)
Sodium: 143 mEq/L (ref 135–145)
Total Protein: 6.4 g/dL (ref 6.0–8.3)

## 2008-02-18 LAB — CBC WITH DIFFERENTIAL/PLATELET
BASO%: 1 % (ref 0.0–2.0)
EOS%: 5.8 % (ref 0.0–7.0)
MCH: 31.3 pg (ref 28.0–33.4)
MCHC: 33.5 g/dL (ref 32.0–35.9)
NEUT%: 58.4 % (ref 40.0–75.0)
RDW: 12.9 % (ref 11.2–14.6)
lymph#: 2.1 10*3/uL (ref 0.9–3.3)

## 2008-02-18 LAB — LACTATE DEHYDROGENASE: LDH: 191 U/L (ref 94–250)

## 2008-03-02 ENCOUNTER — Ambulatory Visit: Payer: Self-pay | Admitting: Cardiology

## 2008-03-08 ENCOUNTER — Ambulatory Visit: Payer: Self-pay

## 2008-04-06 ENCOUNTER — Ambulatory Visit: Payer: Self-pay | Admitting: Oncology

## 2008-04-07 ENCOUNTER — Encounter: Payer: Self-pay | Admitting: Gastroenterology

## 2008-05-20 ENCOUNTER — Encounter: Payer: Self-pay | Admitting: Gastroenterology

## 2008-05-20 LAB — COMPREHENSIVE METABOLIC PANEL
ALT: 13 U/L (ref 0–53)
CO2: 23 mEq/L (ref 19–32)
Creatinine, Ser: 1.09 mg/dL (ref 0.40–1.50)
Glucose, Bld: 117 mg/dL — ABNORMAL HIGH (ref 70–99)
Total Bilirubin: 0.5 mg/dL (ref 0.3–1.2)

## 2008-05-20 LAB — LACTATE DEHYDROGENASE: LDH: 207 U/L (ref 94–250)

## 2008-05-20 LAB — CBC WITH DIFFERENTIAL/PLATELET
Eosinophils Absolute: 0.4 10*3/uL (ref 0.0–0.5)
LYMPH%: 30.3 % (ref 14.0–48.0)
MONO#: 0.7 10*3/uL (ref 0.1–0.9)
NEUT#: 4.2 10*3/uL (ref 1.5–6.5)
Platelets: 188 10*3/uL (ref 145–400)
RBC: 4.55 10*6/uL (ref 4.20–5.71)
RDW: 12.5 % (ref 11.2–14.6)
WBC: 7.6 10*3/uL (ref 4.0–10.0)
lymph#: 2.3 10*3/uL (ref 0.9–3.3)

## 2008-06-27 ENCOUNTER — Ambulatory Visit (HOSPITAL_COMMUNITY): Admission: RE | Admit: 2008-06-27 | Discharge: 2008-06-27 | Payer: Self-pay | Admitting: Oncology

## 2008-06-28 ENCOUNTER — Ambulatory Visit: Payer: Self-pay | Admitting: Oncology

## 2008-06-30 ENCOUNTER — Encounter: Payer: Self-pay | Admitting: Gastroenterology

## 2008-06-30 LAB — CBC WITH DIFFERENTIAL/PLATELET
Basophils Absolute: 0.1 10*3/uL (ref 0.0–0.1)
Eosinophils Absolute: 0.4 10*3/uL (ref 0.0–0.5)
HCT: 44.2 % (ref 38.7–49.9)
HGB: 14.9 g/dL (ref 13.0–17.1)
LYMPH%: 31.3 % (ref 14.0–48.0)
MCV: 91.7 fL (ref 81.6–98.0)
MONO%: 10 % (ref 0.0–13.0)
NEUT#: 3.9 10*3/uL (ref 1.5–6.5)
Platelets: 197 10*3/uL (ref 145–400)
RDW: 12.3 % (ref 11.2–14.6)

## 2008-06-30 LAB — COMPREHENSIVE METABOLIC PANEL
Albumin: 3.8 g/dL (ref 3.5–5.2)
Alkaline Phosphatase: 67 U/L (ref 39–117)
BUN: 19 mg/dL (ref 6–23)
Glucose, Bld: 147 mg/dL — ABNORMAL HIGH (ref 70–99)
Potassium: 4.3 mEq/L (ref 3.5–5.3)

## 2008-08-17 ENCOUNTER — Ambulatory Visit: Payer: Self-pay | Admitting: Oncology

## 2008-08-23 ENCOUNTER — Encounter: Payer: Self-pay | Admitting: Gastroenterology

## 2008-08-23 LAB — LACTATE DEHYDROGENASE: LDH: 143 U/L (ref 94–250)

## 2008-08-23 LAB — CBC WITH DIFFERENTIAL/PLATELET
EOS%: 6.4 % (ref 0.0–7.0)
Eosinophils Absolute: 0.5 10*3/uL (ref 0.0–0.5)
HGB: 14.6 g/dL (ref 13.0–17.1)
MCV: 90.9 fL (ref 81.6–98.0)
MONO%: 9.7 % (ref 0.0–13.0)
NEUT#: 4.5 10*3/uL (ref 1.5–6.5)
RBC: 4.62 10*6/uL (ref 4.20–5.71)
RDW: 12.3 % (ref 11.2–14.6)
lymph#: 1.9 10*3/uL (ref 0.9–3.3)

## 2008-08-23 LAB — COMPREHENSIVE METABOLIC PANEL
Alkaline Phosphatase: 62 U/L (ref 39–117)
BUN: 16 mg/dL (ref 6–23)
Creatinine, Ser: 1.09 mg/dL (ref 0.40–1.50)
Glucose, Bld: 115 mg/dL — ABNORMAL HIGH (ref 70–99)
Total Bilirubin: 0.5 mg/dL (ref 0.3–1.2)

## 2008-09-14 ENCOUNTER — Encounter: Payer: Self-pay | Admitting: Gastroenterology

## 2008-09-21 ENCOUNTER — Ambulatory Visit: Payer: Self-pay

## 2008-10-14 ENCOUNTER — Ambulatory Visit: Payer: Self-pay | Admitting: Oncology

## 2008-10-18 ENCOUNTER — Encounter: Payer: Self-pay | Admitting: Gastroenterology

## 2008-10-18 LAB — LACTATE DEHYDROGENASE: LDH: 128 U/L (ref 94–250)

## 2008-10-18 LAB — CBC WITH DIFFERENTIAL/PLATELET
Basophils Absolute: 0 10*3/uL (ref 0.0–0.1)
EOS%: 5 % (ref 0.0–7.0)
Eosinophils Absolute: 0.4 10*3/uL (ref 0.0–0.5)
HCT: 44.8 % (ref 38.4–49.9)
HGB: 15.1 g/dL (ref 13.0–17.1)
MCH: 31.6 pg (ref 27.2–33.4)
MONO#: 0.7 10*3/uL (ref 0.1–0.9)
NEUT#: 4.6 10*3/uL (ref 1.5–6.5)
NEUT%: 60.2 % (ref 39.0–75.0)
RDW: 13.3 % (ref 11.0–14.6)
WBC: 7.6 10*3/uL (ref 4.0–10.3)
lymph#: 1.9 10*3/uL (ref 0.9–3.3)

## 2008-10-18 LAB — COMPREHENSIVE METABOLIC PANEL
AST: 12 U/L (ref 0–37)
Albumin: 3.8 g/dL (ref 3.5–5.2)
BUN: 19 mg/dL (ref 6–23)
CO2: 27 mEq/L (ref 19–32)
Calcium: 8.6 mg/dL (ref 8.4–10.5)
Chloride: 107 mEq/L (ref 96–112)
Creatinine, Ser: 1.08 mg/dL (ref 0.40–1.50)
Glucose, Bld: 104 mg/dL — ABNORMAL HIGH (ref 70–99)
Potassium: 4.3 mEq/L (ref 3.5–5.3)

## 2008-12-09 ENCOUNTER — Ambulatory Visit: Payer: Self-pay | Admitting: Oncology

## 2008-12-13 ENCOUNTER — Encounter: Payer: Self-pay | Admitting: Gastroenterology

## 2008-12-13 LAB — CBC WITH DIFFERENTIAL/PLATELET
BASO%: 0.5 % (ref 0.0–2.0)
EOS%: 4.8 % (ref 0.0–7.0)
HCT: 42.2 % (ref 38.4–49.9)
MCH: 31.9 pg (ref 27.2–33.4)
MCHC: 34.4 g/dL (ref 32.0–36.0)
MONO#: 0.8 10*3/uL (ref 0.1–0.9)
NEUT%: 67.7 % (ref 39.0–75.0)
RDW: 13.2 % (ref 11.0–14.6)
WBC: 11.2 10*3/uL — ABNORMAL HIGH (ref 4.0–10.3)
lymph#: 2.2 10*3/uL (ref 0.9–3.3)

## 2008-12-13 LAB — LACTATE DEHYDROGENASE: LDH: 172 U/L (ref 94–250)

## 2008-12-13 LAB — COMPREHENSIVE METABOLIC PANEL
ALT: 16 U/L (ref 0–53)
Albumin: 3.8 g/dL (ref 3.5–5.2)
CO2: 25 mEq/L (ref 19–32)
Calcium: 8.9 mg/dL (ref 8.4–10.5)
Chloride: 104 mEq/L (ref 96–112)
Glucose, Bld: 115 mg/dL — ABNORMAL HIGH (ref 70–99)
Potassium: 4.4 mEq/L (ref 3.5–5.3)
Sodium: 140 mEq/L (ref 135–145)
Total Bilirubin: 0.4 mg/dL (ref 0.3–1.2)
Total Protein: 6.2 g/dL (ref 6.0–8.3)

## 2009-03-06 ENCOUNTER — Ambulatory Visit: Payer: Self-pay | Admitting: Oncology

## 2009-03-08 ENCOUNTER — Ambulatory Visit (HOSPITAL_COMMUNITY): Admission: RE | Admit: 2009-03-08 | Discharge: 2009-03-08 | Payer: Self-pay | Admitting: Oncology

## 2009-03-08 LAB — CBC WITH DIFFERENTIAL/PLATELET
Eosinophils Absolute: 0.4 10*3/uL (ref 0.0–0.5)
HCT: 41.2 % (ref 38.4–49.9)
LYMPH%: 25.5 % (ref 14.0–49.0)
MCHC: 33.7 g/dL (ref 32.0–36.0)
MCV: 95 fL (ref 79.3–98.0)
MONO#: 0.7 10*3/uL (ref 0.1–0.9)
MONO%: 10 % (ref 0.0–14.0)
NEUT#: 3.8 10*3/uL (ref 1.5–6.5)
NEUT%: 58.2 % (ref 39.0–75.0)
Platelets: 184 10*3/uL (ref 140–400)
RBC: 4.33 10*6/uL (ref 4.20–5.82)
WBC: 6.6 10*3/uL (ref 4.0–10.3)

## 2009-03-08 LAB — COMPREHENSIVE METABOLIC PANEL
Alkaline Phosphatase: 58 U/L (ref 39–117)
CO2: 28 mEq/L (ref 19–32)
Creatinine, Ser: 1.14 mg/dL (ref 0.40–1.50)
Glucose, Bld: 99 mg/dL (ref 70–99)
Total Bilirubin: 0.9 mg/dL (ref 0.3–1.2)

## 2009-03-08 LAB — LACTATE DEHYDROGENASE: LDH: 141 U/L (ref 94–250)

## 2009-03-14 ENCOUNTER — Encounter: Payer: Self-pay | Admitting: Gastroenterology

## 2009-04-20 ENCOUNTER — Ambulatory Visit (HOSPITAL_COMMUNITY): Admission: RE | Admit: 2009-04-20 | Discharge: 2009-04-20 | Payer: Self-pay | Admitting: Ophthalmology

## 2009-05-05 ENCOUNTER — Encounter (INDEPENDENT_AMBULATORY_CARE_PROVIDER_SITE_OTHER): Payer: Self-pay | Admitting: *Deleted

## 2009-06-09 ENCOUNTER — Ambulatory Visit: Payer: Self-pay | Admitting: Oncology

## 2009-06-13 ENCOUNTER — Encounter: Payer: Self-pay | Admitting: Gastroenterology

## 2009-06-13 LAB — CBC WITH DIFFERENTIAL/PLATELET
BASO%: 0.6 % (ref 0.0–2.0)
EOS%: 8 % — ABNORMAL HIGH (ref 0.0–7.0)
HCT: 43.3 % (ref 38.4–49.9)
HGB: 14.6 g/dL (ref 13.0–17.1)
MCHC: 33.7 g/dL (ref 32.0–36.0)
MONO#: 0.6 10*3/uL (ref 0.1–0.9)
NEUT%: 53.6 % (ref 39.0–75.0)
RDW: 12.9 % (ref 11.0–14.6)
WBC: 7.9 10*3/uL (ref 4.0–10.3)
lymph#: 2.4 10*3/uL (ref 0.9–3.3)

## 2009-06-13 LAB — COMPREHENSIVE METABOLIC PANEL
ALT: 13 U/L (ref 0–53)
AST: 16 U/L (ref 0–37)
Albumin: 3.8 g/dL (ref 3.5–5.2)
Alkaline Phosphatase: 66 U/L (ref 39–117)
BUN: 27 mg/dL — ABNORMAL HIGH (ref 6–23)
CO2: 24 mEq/L (ref 19–32)
Calcium: 8.4 mg/dL (ref 8.4–10.5)
Chloride: 106 mEq/L (ref 96–112)
Creatinine, Ser: 1.18 mg/dL (ref 0.40–1.50)
Glucose, Bld: 135 mg/dL — ABNORMAL HIGH (ref 70–99)
Potassium: 4.2 mEq/L (ref 3.5–5.3)
Sodium: 140 mEq/L (ref 135–145)
Total Bilirubin: 0.4 mg/dL (ref 0.3–1.2)
Total Protein: 6.3 g/dL (ref 6.0–8.3)

## 2009-06-13 LAB — LACTATE DEHYDROGENASE: LDH: 180 U/L (ref 94–250)

## 2009-07-05 ENCOUNTER — Ambulatory Visit: Payer: Self-pay | Admitting: Cardiology

## 2009-08-22 ENCOUNTER — Encounter: Payer: Self-pay | Admitting: Cardiology

## 2009-08-26 ENCOUNTER — Inpatient Hospital Stay (HOSPITAL_COMMUNITY): Admission: EM | Admit: 2009-08-26 | Discharge: 2009-08-30 | Payer: Self-pay | Admitting: Emergency Medicine

## 2009-08-26 ENCOUNTER — Ambulatory Visit: Payer: Self-pay | Admitting: Internal Medicine

## 2009-10-06 ENCOUNTER — Encounter: Payer: Self-pay | Admitting: Cardiology

## 2009-11-01 ENCOUNTER — Ambulatory Visit: Payer: Self-pay | Admitting: Oncology

## 2009-11-03 ENCOUNTER — Encounter: Payer: Self-pay | Admitting: Gastroenterology

## 2009-11-03 LAB — COMPREHENSIVE METABOLIC PANEL
ALT: 17 U/L (ref 0–53)
AST: 23 U/L (ref 0–37)
Albumin: 4.2 g/dL (ref 3.5–5.2)
Alkaline Phosphatase: 68 U/L (ref 39–117)
Calcium: 9.3 mg/dL (ref 8.4–10.5)
Chloride: 106 mEq/L (ref 96–112)
Potassium: 4.1 mEq/L (ref 3.5–5.3)
Sodium: 140 mEq/L (ref 135–145)

## 2009-11-03 LAB — CBC WITH DIFFERENTIAL/PLATELET
BASO%: 0.6 % (ref 0.0–2.0)
Basophils Absolute: 0.1 10*3/uL (ref 0.0–0.1)
EOS%: 8.8 % — ABNORMAL HIGH (ref 0.0–7.0)
HGB: 14.4 g/dL (ref 13.0–17.1)
MCH: 31.7 pg (ref 27.2–33.4)
MCHC: 33.3 g/dL (ref 32.0–36.0)
RBC: 4.54 10*6/uL (ref 4.20–5.82)
RDW: 12.9 % (ref 11.0–14.6)
lymph#: 2.4 10*3/uL (ref 0.9–3.3)

## 2009-11-14 ENCOUNTER — Ambulatory Visit: Payer: Self-pay | Admitting: Cardiology

## 2010-01-18 ENCOUNTER — Ambulatory Visit: Payer: Self-pay | Admitting: Oncology

## 2010-01-19 LAB — COMPREHENSIVE METABOLIC PANEL
AST: 22 U/L (ref 0–37)
Albumin: 3.9 g/dL (ref 3.5–5.2)
BUN: 22 mg/dL (ref 6–23)
Calcium: 8.9 mg/dL (ref 8.4–10.5)
Chloride: 106 mEq/L (ref 96–112)
Potassium: 4.3 mEq/L (ref 3.5–5.3)
Total Protein: 6.8 g/dL (ref 6.0–8.3)

## 2010-01-19 LAB — CBC WITH DIFFERENTIAL/PLATELET
BASO%: 0.3 % (ref 0.0–2.0)
Basophils Absolute: 0 10*3/uL (ref 0.0–0.1)
HCT: 42.5 % (ref 38.4–49.9)
HGB: 14.4 g/dL (ref 13.0–17.1)
MCHC: 33.9 g/dL (ref 32.0–36.0)
MONO#: 0.7 10*3/uL (ref 0.1–0.9)
NEUT%: 58.3 % (ref 39.0–75.0)
WBC: 8.8 10*3/uL (ref 4.0–10.3)
lymph#: 2 10*3/uL (ref 0.9–3.3)

## 2010-01-24 ENCOUNTER — Ambulatory Visit (HOSPITAL_COMMUNITY): Admission: RE | Admit: 2010-01-24 | Discharge: 2010-01-24 | Payer: Self-pay | Admitting: Oncology

## 2010-02-01 ENCOUNTER — Encounter: Payer: Self-pay | Admitting: Gastroenterology

## 2010-02-19 ENCOUNTER — Ambulatory Visit: Payer: Self-pay | Admitting: Oncology

## 2010-02-19 ENCOUNTER — Encounter: Payer: Self-pay | Admitting: Gastroenterology

## 2010-02-19 LAB — COMPREHENSIVE METABOLIC PANEL
ALT: 13 U/L (ref 0–53)
BUN: 25 mg/dL — ABNORMAL HIGH (ref 6–23)
CO2: 22 mEq/L (ref 19–32)
Calcium: 9.2 mg/dL (ref 8.4–10.5)
Chloride: 104 mEq/L (ref 96–112)
Creatinine, Ser: 1.07 mg/dL (ref 0.40–1.50)

## 2010-02-19 LAB — CBC WITH DIFFERENTIAL/PLATELET
Eosinophils Absolute: 0.7 10*3/uL — ABNORMAL HIGH (ref 0.0–0.5)
HCT: 42.9 % (ref 38.4–49.9)
HGB: 14.5 g/dL (ref 13.0–17.1)
LYMPH%: 27.6 % (ref 14.0–49.0)
MONO#: 0.7 10*3/uL (ref 0.1–0.9)
NEUT#: 4.7 10*3/uL (ref 1.5–6.5)
NEUT%: 54.8 % (ref 39.0–75.0)
Platelets: 186 10*3/uL (ref 140–400)
WBC: 8.6 10*3/uL (ref 4.0–10.3)
nRBC: 0 % (ref 0–0)

## 2010-02-19 LAB — LACTATE DEHYDROGENASE: LDH: 173 U/L (ref 94–250)

## 2010-02-27 LAB — CBC WITH DIFFERENTIAL/PLATELET
EOS%: 3.2 % (ref 0.0–7.0)
Eosinophils Absolute: 0.6 10*3/uL — ABNORMAL HIGH (ref 0.0–0.5)
MCH: 31.5 pg (ref 27.2–33.4)
MCV: 94.7 fL (ref 79.3–98.0)
MONO%: 8.3 % (ref 0.0–14.0)
NEUT#: 15.5 10*3/uL — ABNORMAL HIGH (ref 1.5–6.5)
RBC: 4.3 10*6/uL (ref 4.20–5.82)
RDW: 14.6 % (ref 11.0–14.6)

## 2010-03-06 LAB — CBC WITH DIFFERENTIAL/PLATELET
Eosinophils Absolute: 0.4 10*3/uL (ref 0.0–0.5)
MONO#: 0.7 10*3/uL (ref 0.1–0.9)
NEUT#: 6.4 10*3/uL (ref 1.5–6.5)
RBC: 4.15 10*6/uL — ABNORMAL LOW (ref 4.20–5.82)
RDW: 14.6 % (ref 11.0–14.6)
WBC: 8.4 10*3/uL (ref 4.0–10.3)
lymph#: 0.9 10*3/uL (ref 0.9–3.3)

## 2010-03-13 LAB — CBC WITH DIFFERENTIAL/PLATELET
BASO%: 1.1 % (ref 0.0–2.0)
EOS%: 8.9 % — ABNORMAL HIGH (ref 0.0–7.0)
HCT: 37.5 % — ABNORMAL LOW (ref 38.4–49.9)
MCH: 32.2 pg (ref 27.2–33.4)
MCHC: 33.8 g/dL (ref 32.0–36.0)
NEUT%: 56 % (ref 39.0–75.0)
RBC: 3.94 10*6/uL — ABNORMAL LOW (ref 4.20–5.82)
RDW: 14.5 % (ref 11.0–14.6)
WBC: 6.2 10*3/uL (ref 4.0–10.3)
lymph#: 1.3 10*3/uL (ref 0.9–3.3)

## 2010-03-19 ENCOUNTER — Encounter: Payer: Self-pay | Admitting: Gastroenterology

## 2010-03-19 LAB — CBC WITH DIFFERENTIAL/PLATELET
Eosinophils Absolute: 0.6 10*3/uL — ABNORMAL HIGH (ref 0.0–0.5)
MONO#: 0.7 10*3/uL (ref 0.1–0.9)
NEUT#: 3.7 10*3/uL (ref 1.5–6.5)
RBC: 4.39 10*6/uL (ref 4.20–5.82)
RDW: 14.1 % (ref 11.0–14.6)
WBC: 6.7 10*3/uL (ref 4.0–10.3)

## 2010-03-20 LAB — COMPREHENSIVE METABOLIC PANEL
ALT: 15 U/L (ref 0–53)
AST: 17 U/L (ref 0–37)
Albumin: 3.6 g/dL (ref 3.5–5.2)
Alkaline Phosphatase: 61 U/L (ref 39–117)
BUN: 17 mg/dL (ref 6–23)
Creatinine, Ser: 1.01 mg/dL (ref 0.40–1.50)
Potassium: 3.9 mEq/L (ref 3.5–5.3)

## 2010-03-20 LAB — LACTATE DEHYDROGENASE: LDH: 123 U/L (ref 94–250)

## 2010-03-20 LAB — HEPATITIS B CORE ANTIBODY, IGM: Hep B C IgM: NEGATIVE

## 2010-03-20 LAB — HEPATITIS B CORE ANTIBODY, TOTAL: Hep B Core Total Ab: NEGATIVE

## 2010-03-21 ENCOUNTER — Ambulatory Visit: Payer: Self-pay | Admitting: Cardiovascular Disease

## 2010-03-21 ENCOUNTER — Ambulatory Visit: Payer: Self-pay | Admitting: Oncology

## 2010-03-21 ENCOUNTER — Inpatient Hospital Stay (HOSPITAL_COMMUNITY): Admission: AD | Admit: 2010-03-21 | Discharge: 2010-03-23 | Payer: Self-pay | Admitting: Internal Medicine

## 2010-03-21 ENCOUNTER — Emergency Department (HOSPITAL_COMMUNITY): Admission: EM | Admit: 2010-03-21 | Discharge: 2010-03-21 | Payer: Self-pay | Admitting: Emergency Medicine

## 2010-03-23 ENCOUNTER — Telehealth (INDEPENDENT_AMBULATORY_CARE_PROVIDER_SITE_OTHER): Payer: Self-pay | Admitting: Physician Assistant

## 2010-03-26 LAB — CBC WITH DIFFERENTIAL/PLATELET
BASO%: 0 % (ref 0.0–2.0)
HCT: 37.3 % — ABNORMAL LOW (ref 38.4–49.9)
LYMPH%: 1.2 % — ABNORMAL LOW (ref 14.0–49.0)
MCHC: 34.3 g/dL (ref 32.0–36.0)
MCV: 96.7 fL (ref 79.3–98.0)
MONO#: 0.9 10*3/uL (ref 0.1–0.9)
MONO%: 1.5 % (ref 0.0–14.0)
NEUT%: 95.4 % — ABNORMAL HIGH (ref 39.0–75.0)
Platelets: 225 10*3/uL (ref 140–400)
RBC: 3.86 10*6/uL — ABNORMAL LOW (ref 4.20–5.82)
WBC: 60.3 10*3/uL (ref 4.0–10.3)

## 2010-04-02 LAB — CBC WITH DIFFERENTIAL/PLATELET
BASO%: 0 % (ref 0.0–2.0)
EOS%: 5.4 % (ref 0.0–7.0)
HCT: 38.8 % (ref 38.4–49.9)
LYMPH%: 13.1 % — ABNORMAL LOW (ref 14.0–49.0)
MCH: 32.1 pg (ref 27.2–33.4)
MCHC: 33.2 g/dL (ref 32.0–36.0)
NEUT%: 77.1 % — ABNORMAL HIGH (ref 39.0–75.0)
Platelets: 176 10*3/uL (ref 140–400)
RBC: 4.01 10*6/uL — ABNORMAL LOW (ref 4.20–5.82)
lymph#: 1.9 10*3/uL (ref 0.9–3.3)

## 2010-04-09 LAB — CBC WITH DIFFERENTIAL/PLATELET
BASO%: 0.4 % (ref 0.0–2.0)
Basophils Absolute: 0 10*3/uL (ref 0.0–0.1)
EOS%: 7 % (ref 0.0–7.0)
Eosinophils Absolute: 0.6 10*3/uL — ABNORMAL HIGH (ref 0.0–0.5)
HCT: 38.8 % (ref 38.4–49.9)
HGB: 13.3 g/dL (ref 13.0–17.1)
LYMPH%: 41.6 % (ref 14.0–49.0)
MCH: 32.7 pg (ref 27.2–33.4)
MCHC: 34.3 g/dL (ref 32.0–36.0)
MCV: 95.5 fL (ref 79.3–98.0)
MONO#: 0.6 10*3/uL (ref 0.1–0.9)
MONO%: 6.8 % (ref 0.0–14.0)
NEUT#: 4.1 10*3/uL (ref 1.5–6.5)
NEUT%: 44.2 % (ref 39.0–75.0)
Platelets: 220 10*3/uL (ref 140–400)
RBC: 4.06 10*6/uL — ABNORMAL LOW (ref 4.20–5.82)
RDW: 14.8 % — ABNORMAL HIGH (ref 11.0–14.6)
WBC: 9.2 10*3/uL (ref 4.0–10.3)
lymph#: 3.8 10*3/uL — ABNORMAL HIGH (ref 0.9–3.3)

## 2010-04-13 ENCOUNTER — Encounter: Payer: Self-pay | Admitting: Gastroenterology

## 2010-04-13 LAB — COMPREHENSIVE METABOLIC PANEL
Albumin: 3.5 g/dL (ref 3.5–5.2)
Alkaline Phosphatase: 81 U/L (ref 39–117)
Glucose, Bld: 96 mg/dL (ref 70–99)
Potassium: 4.3 mEq/L (ref 3.5–5.3)
Sodium: 140 mEq/L (ref 135–145)
Total Protein: 6.2 g/dL (ref 6.0–8.3)

## 2010-04-13 LAB — CBC WITH DIFFERENTIAL/PLATELET
Basophils Absolute: 0.1 10*3/uL (ref 0.0–0.1)
EOS%: 7 % (ref 0.0–7.0)
Eosinophils Absolute: 0.7 10*3/uL — ABNORMAL HIGH (ref 0.0–0.5)
HGB: 12.6 g/dL — ABNORMAL LOW (ref 13.0–17.1)
MCH: 32.9 pg (ref 27.2–33.4)
MONO#: 0.9 10*3/uL (ref 0.1–0.9)
NEUT#: 4 10*3/uL (ref 1.5–6.5)
RDW: 14.2 % (ref 11.0–14.6)
WBC: 10 10*3/uL (ref 4.0–10.3)
lymph#: 4.2 10*3/uL — ABNORMAL HIGH (ref 0.9–3.3)

## 2010-05-01 ENCOUNTER — Ambulatory Visit: Payer: Self-pay | Admitting: Oncology

## 2010-05-02 ENCOUNTER — Encounter: Payer: Self-pay | Admitting: Gastroenterology

## 2010-05-02 LAB — COMPREHENSIVE METABOLIC PANEL
Albumin: 3.9 g/dL (ref 3.5–5.2)
Alkaline Phosphatase: 65 U/L (ref 39–117)
BUN: 19 mg/dL (ref 6–23)
CO2: 31 mEq/L (ref 19–32)
Glucose, Bld: 86 mg/dL (ref 70–99)
Potassium: 3.6 mEq/L (ref 3.5–5.3)
Total Protein: 6 g/dL (ref 6.0–8.3)

## 2010-05-02 LAB — CBC WITH DIFFERENTIAL/PLATELET
Eosinophils Absolute: 1 10*3/uL — ABNORMAL HIGH (ref 0.0–0.5)
HGB: 13.9 g/dL (ref 13.0–17.1)
MCV: 95.4 fL (ref 79.3–98.0)
MONO#: 0.8 10*3/uL (ref 0.1–0.9)
MONO%: 9.5 % (ref 0.0–14.0)
NEUT#: 3.3 10*3/uL (ref 1.5–6.5)
RBC: 4.35 10*6/uL (ref 4.20–5.82)
RDW: 13.2 % (ref 11.0–14.6)
WBC: 7.9 10*3/uL (ref 4.0–10.3)
lymph#: 2.7 10*3/uL (ref 0.9–3.3)

## 2010-05-02 LAB — LACTATE DEHYDROGENASE: LDH: 159 U/L (ref 94–250)

## 2010-05-04 ENCOUNTER — Encounter: Payer: Self-pay | Admitting: Cardiology

## 2010-05-10 ENCOUNTER — Ambulatory Visit: Payer: Self-pay | Admitting: Cardiology

## 2010-05-15 LAB — CBC WITH DIFFERENTIAL/PLATELET
Basophils Absolute: 0 10*3/uL (ref 0.0–0.1)
Eosinophils Absolute: 0.5 10*3/uL (ref 0.0–0.5)
HGB: 13.3 g/dL (ref 13.0–17.1)
MCV: 94.8 fL (ref 79.3–98.0)
MONO#: 0.7 10*3/uL (ref 0.1–0.9)
MONO%: 7.6 % (ref 0.0–14.0)
NEUT#: 7 10*3/uL — ABNORMAL HIGH (ref 1.5–6.5)
RBC: 4.15 10*6/uL — ABNORMAL LOW (ref 4.20–5.82)
RDW: 13.8 % (ref 11.0–14.6)
WBC: 8.7 10*3/uL (ref 4.0–10.3)
lymph#: 0.5 10*3/uL — ABNORMAL LOW (ref 0.9–3.3)

## 2010-05-22 ENCOUNTER — Ambulatory Visit: Payer: Self-pay | Admitting: Oncology

## 2010-05-22 LAB — CBC WITH DIFFERENTIAL/PLATELET
Basophils Absolute: 0.1 10*3/uL (ref 0.0–0.1)
Eosinophils Absolute: 0.4 10*3/uL (ref 0.0–0.5)
HCT: 39 % (ref 38.4–49.9)
HGB: 12.8 g/dL — ABNORMAL LOW (ref 13.0–17.1)
LYMPH%: 34 % (ref 14.0–49.0)
MCV: 94.7 fL (ref 79.3–98.0)
MONO#: 0.6 10*3/uL (ref 0.1–0.9)
MONO%: 15.9 % — ABNORMAL HIGH (ref 0.0–14.0)
NEUT#: 1.6 10*3/uL (ref 1.5–6.5)
NEUT%: 39.7 % (ref 39.0–75.0)
Platelets: 254 10*3/uL (ref 140–400)
RBC: 4.12 10*6/uL — ABNORMAL LOW (ref 4.20–5.82)
WBC: 4 10*3/uL (ref 4.0–10.3)

## 2010-05-31 ENCOUNTER — Encounter: Payer: Self-pay | Admitting: Gastroenterology

## 2010-05-31 LAB — CBC WITH DIFFERENTIAL/PLATELET
BASO%: 2.2 % — ABNORMAL HIGH (ref 0.0–2.0)
HCT: 43.3 % (ref 38.4–49.9)
HGB: 14.7 g/dL (ref 13.0–17.1)
LYMPH%: 26.7 % (ref 14.0–49.0)
MCH: 31.8 pg (ref 27.2–33.4)
MCV: 93.7 fL (ref 79.3–98.0)
MONO#: 0.8 10*3/uL (ref 0.1–0.9)
NEUT#: 2.2 10*3/uL (ref 1.5–6.5)
NEUT%: 44 % (ref 39.0–75.0)
Platelets: 218 10*3/uL (ref 140–400)
RBC: 4.62 10*6/uL (ref 4.20–5.82)
RDW: 13 % (ref 11.0–14.6)
lymph#: 1.3 10*3/uL (ref 0.9–3.3)

## 2010-05-31 LAB — COMPREHENSIVE METABOLIC PANEL
Albumin: 3.9 g/dL (ref 3.5–5.2)
Alkaline Phosphatase: 72 U/L (ref 39–117)
BUN: 16 mg/dL (ref 6–23)
CO2: 26 mEq/L (ref 19–32)
Calcium: 8.9 mg/dL (ref 8.4–10.5)
Chloride: 104 mEq/L (ref 96–112)
Glucose, Bld: 93 mg/dL (ref 70–99)
Potassium: 4.1 mEq/L (ref 3.5–5.3)
Sodium: 140 mEq/L (ref 135–145)
Total Protein: 6 g/dL (ref 6.0–8.3)

## 2010-05-31 LAB — LACTATE DEHYDROGENASE: LDH: 170 U/L (ref 94–250)

## 2010-06-08 LAB — CBC WITH DIFFERENTIAL/PLATELET
BASO%: 0.3 % (ref 0.0–2.0)
Eosinophils Absolute: 0.5 10*3/uL (ref 0.0–0.5)
MCHC: 34.2 g/dL (ref 32.0–36.0)
MONO#: 0.9 10*3/uL (ref 0.1–0.9)
NEUT#: 24.2 10*3/uL — ABNORMAL HIGH (ref 1.5–6.5)
RBC: 4.27 10*6/uL (ref 4.20–5.82)
RDW: 13.5 % (ref 11.0–14.6)
WBC: 26.1 10*3/uL — ABNORMAL HIGH (ref 4.0–10.3)
lymph#: 0.3 10*3/uL — ABNORMAL LOW (ref 0.9–3.3)

## 2010-06-15 LAB — CBC WITH DIFFERENTIAL/PLATELET
Basophils Absolute: 0 10*3/uL (ref 0.0–0.1)
Eosinophils Absolute: 0.6 10*3/uL — ABNORMAL HIGH (ref 0.0–0.5)
HCT: 37.5 % — ABNORMAL LOW (ref 38.4–49.9)
HGB: 12.9 g/dL — ABNORMAL LOW (ref 13.0–17.1)
MCH: 32.5 pg (ref 27.2–33.4)
MCV: 94.8 fL (ref 79.3–98.0)
NEUT#: 6.8 10*3/uL — ABNORMAL HIGH (ref 1.5–6.5)
NEUT%: 77.4 % — ABNORMAL HIGH (ref 39.0–75.0)
RDW: 13.5 % (ref 11.0–14.6)
lymph#: 0.6 10*3/uL — ABNORMAL LOW (ref 0.9–3.3)

## 2010-06-26 ENCOUNTER — Ambulatory Visit: Payer: Self-pay | Admitting: Oncology

## 2010-06-28 ENCOUNTER — Encounter: Payer: Self-pay | Admitting: Gastroenterology

## 2010-06-28 LAB — CBC WITH DIFFERENTIAL/PLATELET
BASO%: 0.7 % (ref 0.0–2.0)
Eosinophils Absolute: 0.5 10*3/uL (ref 0.0–0.5)
LYMPH%: 29.9 % (ref 14.0–49.0)
MCHC: 33.2 g/dL (ref 32.0–36.0)
MCV: 94.2 fL (ref 79.3–98.0)
MONO%: 12.6 % (ref 0.0–14.0)
NEUT#: 3.5 10*3/uL (ref 1.5–6.5)
Platelets: 179 10*3/uL (ref 140–400)
RBC: 4.29 10*6/uL (ref 4.20–5.82)
RDW: 13.9 % (ref 11.0–14.6)
WBC: 6.9 10*3/uL (ref 4.0–10.3)

## 2010-06-28 LAB — COMPREHENSIVE METABOLIC PANEL
ALT: 15 U/L (ref 0–53)
AST: 21 U/L (ref 0–37)
Albumin: 3.7 g/dL (ref 3.5–5.2)
Alkaline Phosphatase: 66 U/L (ref 39–117)
Glucose, Bld: 135 mg/dL — ABNORMAL HIGH (ref 70–99)
Potassium: 4.3 mEq/L (ref 3.5–5.3)
Sodium: 139 mEq/L (ref 135–145)
Total Bilirubin: 0.7 mg/dL (ref 0.3–1.2)
Total Protein: 5.5 g/dL — ABNORMAL LOW (ref 6.0–8.3)

## 2010-07-12 LAB — CBC WITH DIFFERENTIAL/PLATELET
BASO%: 0.5 % (ref 0.0–2.0)
EOS%: 4.2 % (ref 0.0–7.0)
HCT: 38.2 % — ABNORMAL LOW (ref 38.4–49.9)
LYMPH%: 21.1 % (ref 14.0–49.0)
MCH: 32.1 pg (ref 27.2–33.4)
MCHC: 34 g/dL (ref 32.0–36.0)
NEUT%: 67 % (ref 39.0–75.0)
RBC: 4.05 10*6/uL — ABNORMAL LOW (ref 4.20–5.82)
WBC: 9.1 10*3/uL (ref 4.0–10.3)
lymph#: 1.9 10*3/uL (ref 0.9–3.3)

## 2010-07-26 ENCOUNTER — Ambulatory Visit: Payer: Self-pay | Admitting: Oncology

## 2010-07-26 ENCOUNTER — Encounter: Payer: Self-pay | Admitting: Gastroenterology

## 2010-08-09 LAB — CBC WITH DIFFERENTIAL/PLATELET
Basophils Absolute: 0 10*3/uL (ref 0.0–0.1)
EOS%: 5.8 % (ref 0.0–7.0)
LYMPH%: 19.5 % (ref 14.0–49.0)
MCH: 32.4 pg (ref 27.2–33.4)
MCV: 94.2 fL (ref 79.3–98.0)
MONO%: 14.4 % — ABNORMAL HIGH (ref 0.0–14.0)
Platelets: 171 10*3/uL (ref 140–400)
RBC: 3.72 10*6/uL — ABNORMAL LOW (ref 4.20–5.82)
RDW: 15.7 % — ABNORMAL HIGH (ref 11.0–14.6)

## 2010-08-22 ENCOUNTER — Ambulatory Visit (HOSPITAL_COMMUNITY)
Admission: RE | Admit: 2010-08-22 | Discharge: 2010-08-22 | Payer: Self-pay | Source: Home / Self Care | Attending: Oncology | Admitting: Oncology

## 2010-08-29 ENCOUNTER — Ambulatory Visit: Payer: Self-pay | Admitting: Oncology

## 2010-08-30 ENCOUNTER — Encounter: Payer: Self-pay | Admitting: Gastroenterology

## 2010-08-30 LAB — CBC WITH DIFFERENTIAL/PLATELET
BASO%: 0.6 % (ref 0.0–2.0)
Basophils Absolute: 0 10*3/uL (ref 0.0–0.1)
EOS%: 6.1 % (ref 0.0–7.0)
Eosinophils Absolute: 0.3 10*3/uL (ref 0.0–0.5)
HCT: 38.3 % — ABNORMAL LOW (ref 38.4–49.9)
HGB: 12.9 g/dL — ABNORMAL LOW (ref 13.0–17.1)
LYMPH%: 16.7 % (ref 14.0–49.0)
MCH: 31.4 pg (ref 27.2–33.4)
MCHC: 33.7 g/dL (ref 32.0–36.0)
MCV: 93.2 fL (ref 79.3–98.0)
MONO#: 0.8 10*3/uL (ref 0.1–0.9)
MONO%: 14.4 % — ABNORMAL HIGH (ref 0.0–14.0)
NEUT#: 3.3 10*3/uL (ref 1.5–6.5)
NEUT%: 62.2 % (ref 39.0–75.0)
Platelets: 169 10*3/uL (ref 140–400)
RBC: 4.11 10*6/uL — ABNORMAL LOW (ref 4.20–5.82)
RDW: 14.1 % (ref 11.0–14.6)
WBC: 5.3 10*3/uL (ref 4.0–10.3)
lymph#: 0.9 10*3/uL (ref 0.9–3.3)
nRBC: 0 % (ref 0–0)

## 2010-08-30 LAB — COMPREHENSIVE METABOLIC PANEL
ALT: 15 U/L (ref 0–53)
AST: 18 U/L (ref 0–37)
Albumin: 4.1 g/dL (ref 3.5–5.2)
Alkaline Phosphatase: 68 U/L (ref 39–117)
BUN: 13 mg/dL (ref 6–23)
CO2: 29 mEq/L (ref 19–32)
Calcium: 9.2 mg/dL (ref 8.4–10.5)
Chloride: 105 mEq/L (ref 96–112)
Creatinine, Ser: 1.09 mg/dL (ref 0.40–1.50)
Glucose, Bld: 70 mg/dL (ref 70–99)
Potassium: 4 mEq/L (ref 3.5–5.3)
Sodium: 143 mEq/L (ref 135–145)
Total Bilirubin: 0.6 mg/dL (ref 0.3–1.2)
Total Protein: 6.4 g/dL (ref 6.0–8.3)

## 2010-08-30 LAB — SEDIMENTATION RATE: Sed Rate: 6 mm/hr (ref 0–16)

## 2010-08-30 LAB — LACTATE DEHYDROGENASE: LDH: 154 U/L (ref 94–250)

## 2010-09-24 ENCOUNTER — Encounter: Payer: Self-pay | Admitting: Gastroenterology

## 2010-09-24 LAB — COMPREHENSIVE METABOLIC PANEL
Albumin: 3.9 g/dL (ref 3.5–5.2)
BUN: 18 mg/dL (ref 6–23)
CO2: 30 mEq/L (ref 19–32)
Calcium: 8.8 mg/dL (ref 8.4–10.5)
Chloride: 106 mEq/L (ref 96–112)
Glucose, Bld: 98 mg/dL (ref 70–99)
Potassium: 3.7 mEq/L (ref 3.5–5.3)
Sodium: 143 mEq/L (ref 135–145)
Total Protein: 5.9 g/dL — ABNORMAL LOW (ref 6.0–8.3)

## 2010-09-24 LAB — CBC WITH DIFFERENTIAL/PLATELET
BASO%: 0.6 % (ref 0.0–2.0)
Eosinophils Absolute: 0.5 10*3/uL (ref 0.0–0.5)
HCT: 40.7 % (ref 38.4–49.9)
LYMPH%: 26.7 % (ref 14.0–49.0)
MCHC: 33.7 g/dL (ref 32.0–36.0)
MONO#: 0.7 10*3/uL (ref 0.1–0.9)
NEUT#: 2.2 10*3/uL (ref 1.5–6.5)
NEUT%: 47.9 % (ref 39.0–75.0)
Platelets: 181 10*3/uL (ref 140–400)
WBC: 4.7 10*3/uL (ref 4.0–10.3)
lymph#: 1.3 10*3/uL (ref 0.9–3.3)
nRBC: 0 % (ref 0–0)

## 2010-09-25 NOTE — Miscellaneous (Signed)
  Clinical Lists Changes  Observations: Added new observation of CARDCATHFIND: 1. Severe 3-vessel coronary artery disease as outlined.   2. Patent left mainstem.   3. Patent left internal mammary artery to left anterior descending.   4. Known occlusion of the saphenous vein graft to diagonal and       saphenous vein graft sequence to obtuse marginal branches.   5. Severe diffuse diagonal and obtuse marginal branch stenosis.   6. Preserved left ventricular function.      RECOMMENDATIONS:  Mr. Ancrum films were reviewed carefully.  They were   compared to his previous studies.  He has diffuse calcific stenosis of   his branch vessels involving the diagonals and the obtuse marginals.  He   has really been angina free over the last several months until just the   last few days and is again chest pain-free.  I think the risk benefit   ratio of PCI is unfavorable based on complex anatomy and his disease is   stable from previous studies.  We would recommend ongoing medical   therapy at this time.        (03/22/2010 14:05)      Cardiac Cath  Procedure date:  03/22/2010  Findings:      1. Severe 3-vessel coronary artery disease as outlined.   2. Patent left mainstem.   3. Patent left internal mammary artery to left anterior descending.   4. Known occlusion of the saphenous vein graft to diagonal and       saphenous vein graft sequence to obtuse marginal branches.   5. Severe diffuse diagonal and obtuse marginal branch stenosis.   6. Preserved left ventricular function.      RECOMMENDATIONS:  Mr. Polanco films were reviewed carefully.  They were   compared to his previous studies.  He has diffuse calcific stenosis of   his branch vessels involving the diagonals and the obtuse marginals.  He   has really been angina free over the last several months until just the   last few days and is again chest pain-free.  I think the risk benefit   ratio of PCI is unfavorable based on complex  anatomy and his disease is   stable from previous studies.  We would recommend ongoing medical   therapy at this time.

## 2010-09-25 NOTE — Letter (Signed)
Summary: [facility]  [facility]   Imported By: Sherian Rein 07/06/2010 10:37:00  _____________________________________________________________________  External Attachment:    Type:   Image     Comment:   External Document

## 2010-09-25 NOTE — Letter (Signed)
Summary: [facility]  [facility]   Imported By: Sherian Rein 07/02/2010 08:41:48  _____________________________________________________________________  External Attachment:    Type:   Image     Comment:   External Document

## 2010-09-25 NOTE — Letter (Signed)
[facility]   Imported By: Sherian Rein 03/06/2010 10:58:50  _____________________________________________________________________  External Attachment:    Type:   Image     Comment:   External Document

## 2010-09-25 NOTE — Assessment & Plan Note (Signed)
Summary: rov/per pt call/jss  Medications Added LISINOPRIL 40 MG TABS (LISINOPRIL) Take one tablet by mouth daily MULTIVITAMINS   TABS (MULTIPLE VITAMIN) 1 by mouth daily ISOSORBIDE MONONITRATE CR 30 MG XR24H-TAB (ISOSORBIDE MONONITRATE) 1 by mouth daily NITROSTAT 0.4 MG SUBL (NITROGLYCERIN) as needed  for chest pain      Allergies Added:   Visit Type:  Follow-up Primary Provider:  Ardeen Garland, MD  CC:  CAD.  History of Present Illness: The patient returns for followup after a hospitalization earlier this year for a non-Q-wave myocardial infarction. Anatomy as described below. An attempt was made at percutaneous revascularization but this could not be done successfully and so he has been managed medically. Since that time he has had no further chest discomfort, neck or arm discomfort. He has had no palpitations, presyncope or syncope. He denies PND or orthopnea.  Current Medications (verified): 1)  Lisinopril 20 Mg Tabs (Lisinopril) .... Take One Tablet Once Daily 2)  Plavix 75 Mg Tabs (Clopidogrel Bisulfate) .... Take One Tablet By Mouth Daily 3)  Citalopram Hydrobromide 20 Mg Tabs (Citalopram Hydrobromide) .Marland Kitchen.. 1 By Mouth Daily 4)  Aspirin 325 Mg  Tabs (Aspirin) .Marland Kitchen.. 1 By Mouth Daily 5)  Atenolol 50 Mg Tabs (Atenolol) .Marland Kitchen.. 1 By Mouth Daily 6)  Pravastatin Sodium 80 Mg Tabs (Pravastatin Sodium) .... One By Mouth At Bedtime 7)  Multivitamins   Tabs (Multiple Vitamin) .Marland Kitchen.. 1 By Mouth Daily 8)  Isosorbide Mononitrate Cr 30 Mg Xr24h-Tab (Isosorbide Mononitrate) .Marland Kitchen.. 1 By Mouth Daily 9)  Nitrostat 0.4 Mg Subl (Nitroglycerin) .... As Needed  For Chest Pain  Allergies (verified): 1)  ! Lipitor (Atorvastatin)  Past History:  Past Medical History: INGUINAL HERNIAS, BILATERAL (ICD-550.92) MANTLE CELL LYMPHOMA INTRA-ABDOMINAL LYMPH NODES (ICD-200.43) CANCER, THYMUS (ICD-164.0) PERIPHERAL VASCULAR DISEASE (ICD-443.9) BENIGN PROSTATIC HYPERTROPHY, HX OF (ICD-V13.8) CAD (ICD-414.0(status  post CABG in 2003   with a LIMA to the LAD, SVG to diagonal, SVG sequential to OM-1 and PDA.   Catheterization in 2005 demonstrated that the sequential vein graft had   occluded to the PDA but was open to the OM.  The vein graft to diagonal   was occluded.  The LIMA was patent.  He had 75-80% left main stenosis   and had this stented for protection of the circumflex.  This was a   Cypher stent placed by Dr. Samule Ohm.) Cath 08/28/09-  1. Severe three-vessel coronary artery disease. 2. Left main stent is widely patent. 3. The left internal mammary artery is widely patent. 4. The saphenous vein graft to the diagonal is chronically occluded. 5. Interval occlusion of the proximal portion of the saphenous vein     sequential graft to the obtuse marginal with chronic total     occlusion of distal portion with the saphenous vein graft leading     to the left-sided posterior descending artery. 6. Progression of the proximal left anterior descending artery     disease, possibly jeopardizing flow into the diagonal system. 7. Normal left ventricular function.  SLEEP APNEA (ICD-780.57) Carotid Stenosis NEPHROLITHIASIS, RECURRENT (ICD-592.0) ARTHRITIS (ICD-716.90) HYPERLIPIDEMIA (ICD-272.4) HYPERTENSION (ICD-401.9)  Past Surgical History: Reviewed history from 11/12/2007 and no changes required. CABG (2003) Malignant Thymoma Resecion (2003) PTCA (2005) Left Colectomy;benign sessile polyp (1995)  Vital Signs:  Patient profile:   77 year old male Height:      69 inches Weight:      208 pounds BMI:     30.83 Pulse rate:   64 / minute Resp:  16 per minute BP sitting:   162 / 86  (right arm)  Vitals Entered By: Marrion Coy, CNA (November 14, 2009 2:44 PM)  Physical Exam  General:  Well developed, well nourished, in no acute distress. Head:  normocephalic and atraumatic Eyes:  PERRLA/EOM intact; conjunctiva and lids normal. Mouth:  Teeth, gums and palate normal. Oral mucosa normal. Neck:   Neck supple, no JVD. No masses, thyromegaly or abnormal cervical nodes. Chest Wall:  well-healed sternotomy scar Lungs:  Clear bilaterally to auscultation and percussion. Abdomen:  Bowel sounds positive; abdomen soft and non-tender without masses, organomegaly, or hernias noted. No hepatosplenomegaly, obese Msk:  Back normal, normal gait. Muscle strength and tone normal. Extremities:  bilateral ankle edema moderate Neurologic:  Alert and oriented x 3. Skin:  Intact without lesions or rashes. Cervical Nodes:  no significant adenopathy Axillary Nodes:  no significant adenopathy Inguinal Nodes:  no significant adenopathy Psych:  Normal affect.   Detailed Cardiovascular Exam  Neck    Carotids: Carotids full and equal bilaterally without bruits.      Neck Veins: Normal, no JVD.    Heart    Inspection: no deformities or lifts noted.      Palpation: normal PMI with no thrills palpable.      Auscultation: regular rate and rhythm, S1, S2 without murmurs, rubs, gallops, or clicks.    Vascular    Abdominal Aorta: no palpable masses, pulsations, or audible bruits.      Femoral Pulses: normal femoral pulses bilaterally.      Pedal Pulses: normal pedal pulses bilaterally.      Radial Pulses: normal radial pulses bilaterally.      Peripheral Circulation: no clubbing, cyanosis, or edema noted with normal capillary refill.     EKG  Procedure date:  11/14/2009  Findings:      sinus rhythm, rate 64, old inferior infarct, no acute ST-T wave changes.  Impression & Recommendations:  Problem # 1:  CAD (ICD-414.00) He has anatomy as described and will continue to be managed medically for this. No further testing is suggested.  Problem # 2:  HYPERTENSION (ICD-401.9) His blood pressure is not well controlled and today I increased his lisinopril to 40 mg daily. He will get a basic metabolic profile in 2 weeks.  Problem # 3:  HYPERLIPIDEMIA (ICD-272.4) I review his lipids. He will remain on the  meds as listed.  Patient Instructions: 1)  Your physician recommends that you schedule a follow-up appointment in: 4 months in [place] 2)  Your physician recommends that you return for lab work in: [facility] PCP office  in 2 weeks Prescriptions: LISINOPRIL 40 MG TABS (LISINOPRIL) Take one tablet by mouth daily  #30 x 6   Entered by:   Ollen Gross, RN, BSN   Authorized by:   [name], MD, [facility]   Signed by:   Ollen Gross, RN, BSN on 11/14/2009   Method used:   Electronically to        [facility]  [address] (retail)       [address]       [place], [place]  [address]       Ph: [phone]       Fax: [phone]   RxID:   [id number]

## 2010-09-25 NOTE — Letter (Signed)
[facility]   Imported By: Lennie Odor 03/05/2010 14:30:04  _____________________________________________________________________  External Attachment:    Type:   Image     Comment:   External Document

## 2010-09-25 NOTE — Letter (Signed)
Summary: [facility]  [facility]   Imported By: Lester Pasatiempo 05/23/2010 11:06:28  _____________________________________________________________________  External Attachment:    Type:   Image     Comment:   External Document

## 2010-09-25 NOTE — Assessment & Plan Note (Signed)
Summary: eph/jml  Medications Added AMLODIPINE BESYLATE 2.5 MG TABS (AMLODIPINE BESYLATE) 1 by mouth daily ISOSORBIDE MONONITRATE CR 60 MG XR24H-TAB (ISOSORBIDE MONONITRATE) 1 by mouth daily      Allergies Added:   Visit Type:  Follow-up Primary Provider:  Ardeen Garland, MD  CC:  CAD.  History of Present Illness: The patient presents for followup after recent non-Q wave myocardial infarction. He presented with chest discomfort and did have an enzyme elevation. However, cardiac catheterization demonstrated no change in his coronary anatomy from a previous catheterization. He was therefore continued on medical management. (Anatomy as described below.) Since then he has had no new symptoms. He denies any new chest pressure, neck or arm discomfort. His had no new palpitations, presyncope or syncope. He does do some yard work and did some trimming recently without bringing on symptoms. He's had no weight gain or edema. He does continue therapy for lymphoma. He and his wife are both concerned that this could have been responsible for his heart attack and I assured them that it was not.  Current Medications (verified): 1)  Amlodipine Besylate 2.5 Mg Tabs (Amlodipine Besylate) .Marland Kitchen.. 1 By Mouth Daily 2)  Plavix 75 Mg Tabs (Clopidogrel Bisulfate) .... Take One Tablet By Mouth Daily 3)  Citalopram Hydrobromide 20 Mg Tabs (Citalopram Hydrobromide) .Marland Kitchen.. 1 By Mouth Daily 4)  Aspirin 325 Mg  Tabs (Aspirin) .Marland Kitchen.. 1 By Mouth Daily 5)  Atenolol 50 Mg Tabs (Atenolol) .Marland Kitchen.. 1 By Mouth Daily 6)  Pravastatin Sodium 80 Mg Tabs (Pravastatin Sodium) .... One By Mouth At Bedtime 7)  Multivitamins   Tabs (Multiple Vitamin) .Marland Kitchen.. 1 By Mouth Daily 8)  Isosorbide Mononitrate Cr 60 Mg Xr24h-Tab (Isosorbide Mononitrate) .Marland Kitchen.. 1 By Mouth Daily 9)  Nitrostat 0.4 Mg Subl (Nitroglycerin) .... As Needed  For Chest Pain  Allergies (verified): 1)  ! Lipitor (Atorvastatin)  Past History:  Past Medical History: INGUINAL HERNIAS,  BILATERAL (ICD-550.92) MANTLE CELL LYMPHOMA INTRA-ABDOMINAL LYMPH NODES (ICD-200.43) CANCER, THYMUS (ICD-164.0) PERIPHERAL VASCULAR DISEASE (ICD-443.9) BENIGN PROSTATIC HYPERTROPHY, HX OF (ICD-V13.8) CAD (ICD-414.0(status post CABG in 2003)    (Cath 7/11 Patent LIMA to the LAD. Previously known occlusion of a saphenous vein graft to diagonal and    sequential to obtuse marginals. Severe diffuse native obtuse marginal and diagonal branch vessel disease.    Preserved ejection fraction.) SLEEP APNEA (ICD-780.57) Carotid Stenosis NEPHROLITHIASIS, RECU ARTHRITIS (ICD-716.90) HYPERLIPIDEMIA (ICD-272.4) HYPERTENSION (ICD-401.9)  Review of Systems       As stated in the HPI and negative for all other systems.   Vital Signs:  Patient profile:   77 year old male Height:      69 inches Weight:      206 pounds BMI:     30.53 Pulse rate:   63 / minute Resp:     18 per minute BP sitting:   141 / 77  (right arm)  Vitals Entered By: Marrion Coy, CNA (May 10, 2010 3:05 PM)  Physical Exam  General:  Well developed, well nourished, in no acute distress. Head:  normocephalic and atraumatic Neck:  Neck supple, no JVD. No masses, thyromegaly or abnormal cervical nodes. Chest Wall:  well-healed sternotomy scar Lungs:  Clear bilaterally to auscultation and percussion. Abdomen:  Bowel sounds positive; abdomen soft and non-tender without masses, organomegaly, or hernias noted. No hepatosplenomegaly, obese Msk:  Back normal, normal gait. Muscle strength and tone normal. Extremities:  bilateral lower extremity edema moderate Neurologic:  Alert and oriented x 3. Skin:  Intact without  lesions or rashes. Cervical Nodes:  no significant adenopathy Axillary Nodes:  no significant adenopathy Inguinal Nodes:  no significant adenopathy Psych:  Normal affect.   Detailed Cardiovascular Exam  Neck    Carotids: Carotids full and equal bilaterally without bruits.      Neck Veins: Normal, no JVD.      Heart    Inspection: no deformities or lifts noted.      Palpation: normal PMI with no thrills palpable.      Auscultation: regular rate and rhythm, S1, S2 without murmurs, rubs, gallops, or clicks.    Vascular    Abdominal Aorta: no palpable masses, pulsations, or audible bruits.      Femoral Pulses: normal femoral pulses bilaterally.      Pedal Pulses: normal pedal pulses bilaterally.      Radial Pulses: normal radial pulses bilaterally.      Peripheral Circulation: no clubbing, cyanosis,with normal capillary refill.     EKG  Procedure date:  05/10/2010  Findings:      Sinus rhythm, rate 63, axis within normal limits, QTC prolonged, nonspecific T-wave flattening  Impression & Recommendations:  Problem # 1:  CAD (ICD-414.00) He has severe CAD as described. However, he's had no chest pain since his catheterization. His Imdur was increased at that appointment and I clarified this. At this point no change in therapy or further evaluation as indicated. Orders: EKG w/ Interpretation (93000)  Problem # 2:  HYPERTENSION (ICD-401.9) His blood pressure is controlled. He will continue the meds as listed.  Problem # 3:  HYPERLIPIDEMIA (ICD-272.4) His HDL was 38 and LDL 100 at the time of the hospitalization. I will continue the therapies as listed.  Patient Instructions: 1)  Your physician recommends that you schedule a follow-up appointment in: 6 months with Dr Antoine Poche 2)  Your physician recommends that you continue on your current medications as directed. Please refer to the Current Medication list given to you today.

## 2010-09-25 NOTE — Letter (Signed)
Summary: [facility]  [facility]   Imported By: Lennie Odor 05/11/2010 15:54:20  _____________________________________________________________________  External Attachment:    Type:   Image     Comment:   External Document

## 2010-09-25 NOTE — Miscellaneous (Signed)
  Clinical Lists Changes  Observations: Added new observation of CARDCATHFIND: 1. Severe three-vessel coronary artery disease. 2. Left main stent is widely patent. 3. The left internal mammary artery is widely patent. 4. The saphenous vein graft to the diagonal is chronically occluded. 5. Interval occlusion of the proximal portion of the saphenous vein     sequential graft to the obtuse marginal with chronic total     occlusion of distal portion with the saphenous vein graft leading     to the left-sided posterior descending artery. 6. Progression of the proximal left anterior descending artery     disease, possibly jeopardizing flow into the diagonal system. 7. Normal left ventricular function.   PLAN/DISCUSSION:  I have reviewed the case with Dr. Clifton Ramsie Ostrander.  The culprit lesion appears to be the interval loss of the proximal portion of the saphenous vein graft to the OM sequential graft, but Martin Morris also appears to have progression of his proximal LAD disease, which may effect flow into the diagonals.  At this point, we will plan percutaneous intervention on native OM graft and possibly the proximal LAD.   (08/28/2009 15:28)      Cardiac Cath  Procedure date:  08/28/2009  Findings:      HEMODYNAMIC FINDINGS:  Central aortic pressure 156/80.   PROCEDURE IN DETAIL:  Please see above, we were unable to successfully wire the second obtuse marginal.  One hour was spent on this procedure by the physician.   RECOMMENDATIONS:  I recommend continued medical management at this time. If the patient has angina, then we should consider bringing him back at a later time to once again try to open up the obtuse marginal branch. He could really have angina from any of his vessels.  If you please see the diagnostic report, the right coronary is a nondominant vessel but is severely diseased.  The proximal LAD is severely diseased going into several diagonal branches.    Cardiac  Cath  Procedure date:  08/28/2009  Findings:      1. Severe three-vessel coronary artery disease. 2. Left main stent is widely patent. 3. The left internal mammary artery is widely patent. 4. The saphenous vein graft to the diagonal is chronically occluded. 5. Interval occlusion of the proximal portion of the saphenous vein     sequential graft to the obtuse marginal with chronic total     occlusion of distal portion with the saphenous vein graft leading     to the left-sided posterior descending artery. 6. Progression of the proximal left anterior descending artery     disease, possibly jeopardizing flow into the diagonal system. 7. Normal left ventricular function.   PLAN/DISCUSSION:  I have reviewed the case with Dr. Clifton Noami Bove.  The culprit lesion appears to be the interval loss of the proximal portion of the saphenous vein graft to the OM sequential graft, but Martin Morris also appears to have progression of his proximal LAD disease, which may effect flow into the diagonals.  At this point, we will plan percutaneous intervention on native OM graft and possibly the proximal LAD.

## 2010-09-25 NOTE — Progress Notes (Signed)
   Phone Note Call from Patient   Call For: Dr [name] Details for Reason: Accidental Extra Rx Summary of Call: Pt wife called because pt d/c home today, had am meds already but took another set about 30" ago. What to do?->Advised her to check his blood pressure every hour or 2 for the next little while and call 911 if SBP <85 or HR <45. SBP currently 110 and HR nl. If he becomes dizzy and more weak will also have to come in to be watched. Rx included Atenolol 50, Norvasc 2.5, Imdur 60 and Plavix 75. She will make sure he is OK or bring him in. Initial call taken by: Park Breed PA-C,  March 23, 2010 7:14 PM

## 2010-09-25 NOTE — Letter (Signed)
[facility]   Imported By: Sherian Rein 04/09/2010 12:29:16  _____________________________________________________________________  External Attachment:    Type:   Image     Comment:   External Document

## 2010-09-27 NOTE — Letter (Signed)
Summary: [facility]  [facility]   Imported By: Sherian Rein 08/03/2010 07:30:03  _____________________________________________________________________  External Attachment:    Type:   Image     Comment:   External Document

## 2010-09-27 NOTE — Letter (Signed)
Summary: [facility]  [facility]   Imported By: Sherian Rein 09/21/2010 08:54:05  _____________________________________________________________________  External Attachment:    Type:   Image     Comment:   External Document

## 2010-09-28 NOTE — Letter (Signed)
[facility]   Imported By: Sherian Rein 11/17/2009 08:08:45  _____________________________________________________________________  External Attachment:    Type:   Image     Comment:   External Document

## 2010-10-17 ENCOUNTER — Encounter: Payer: Self-pay | Admitting: Cardiology

## 2010-10-17 ENCOUNTER — Ambulatory Visit (INDEPENDENT_AMBULATORY_CARE_PROVIDER_SITE_OTHER): Payer: MEDICARE | Admitting: Cardiology

## 2010-10-17 DIAGNOSIS — I2510 Atherosclerotic heart disease of native coronary artery without angina pectoris: Secondary | ICD-10-CM

## 2010-10-17 DIAGNOSIS — R609 Edema, unspecified: Secondary | ICD-10-CM

## 2010-10-17 NOTE — Letter (Signed)
Summary: [facility]  [facility]   Imported By: Sherian Rein 10/11/2010 11:49:53  _____________________________________________________________________  External Attachment:    Type:   Image     Comment:   External Document

## 2010-10-23 NOTE — Assessment & Plan Note (Signed)
Summary: pt having alot of edema in both legs. hurting. per kelly mizz...  Medications Added FUROSEMIDE 20 MG TABS (FUROSEMIDE) once a daily as directed POTASSIUM CHLORIDE CRYS CR 10 MEQ CR-TABS (POTASSIUM CHLORIDE CRYS CR) once a day as directed      Allergies Added:     Visit Type:  Follow-up Primary Provider:  Ardeen Garland, MD  CC:  Edema.  History of Present Illness: The patient presents for evaluation of lower extremity swelling. This has been noted by a home health nurse. He says that this has been chronic but is worse recently. It has always, since bypass surgery, been higher in the right than left leg. He denies any new PND or orthopnea. He denies any new chest pressure, neck or arm discomfort. He says his weights have been stable.He doesn't specifically restrict salt or fluids. He keeps his feet down much of the day. He doesn't exercise routinely.  Current Medications (verified): 1)  Amlodipine Besylate 2.5 Mg Tabs (Amlodipine Besylate) .Marland Kitchen.. 1 By Mouth Daily 2)  Plavix 75 Mg Tabs (Clopidogrel Bisulfate) .... Take One Tablet By Mouth Daily 3)  Citalopram Hydrobromide 20 Mg Tabs (Citalopram Hydrobromide) .Marland Kitchen.. 1 By Mouth Daily 4)  Aspirin 325 Mg  Tabs (Aspirin) .Marland Kitchen.. 1 By Mouth Daily 5)  Atenolol 50 Mg Tabs (Atenolol) .Marland Kitchen.. 1 By Mouth Daily 6)  Pravastatin Sodium 80 Mg Tabs (Pravastatin Sodium) .... One By Mouth At Bedtime 7)  Multivitamins   Tabs (Multiple Vitamin) .Marland Kitchen.. 1 By Mouth Daily 8)  Isosorbide Mononitrate Cr 60 Mg Xr24h-Tab (Isosorbide Mononitrate) .Marland Kitchen.. 1 By Mouth Daily 9)  Nitrostat 0.4 Mg Subl (Nitroglycerin) .... As Needed  For Chest Pain  Allergies (verified): 1)  ! Lipitor (Atorvastatin)  Past History:  Past Medical History: Reviewed history from 05/10/2010 and no changes required. INGUINAL HERNIAS, BILATERAL (ICD-550.92) MANTLE CELL LYMPHOMA INTRA-ABDOMINAL LYMPH NODES (ICD-200.43) CANCER, THYMUS (ICD-164.0) PERIPHERAL VASCULAR DISEASE (ICD-443.9) BENIGN  PROSTATIC HYPERTROPHY, HX OF (ICD-V13.8) CAD (ICD-414.0(status post CABG in 2003)    (Cath 7/11 Patent LIMA to the LAD. Previously known occlusion of a saphenous vein graft to diagonal and    sequential to obtuse marginals. Severe diffuse native obtuse marginal and diagonal branch vessel disease.    Preserved ejection fraction.) SLEEP APNEA (ICD-780.57) Carotid Stenosis NEPHROLITHIASIS, RECU ARTHRITIS (ICD-716.90) HYPERLIPIDEMIA (ICD-272.4) HYPERTENSION (ICD-401.9)  Past Surgical History: Reviewed history from 11/12/2007 and no changes required. CABG (2003) Malignant Thymoma Resecion (2003) PTCA (2005) Left Colectomy;benign sessile polyp (1995)  Review of Systems       As stated in the HPI and negative for all other systems.   Vital Signs:  Patient profile:   77 year old male Height:      69 inches Weight:      205 pounds BMI:     30.38 Pulse rate:   62 / minute Resp:     16 per minute BP sitting:   138 / 84  (right arm)  Vitals Entered By: Marrion Coy, CNA (October 17, 2010 8:32 AM)  Physical Exam  General:  Well developed, well nourished, in no acute distress. Head:  normocephalic and atraumatic Eyes:  PERRLA/EOM intact; conjunctiva and lids normal. Mouth:  Teeth, gums and palate normal. Oral mucosa normal. Neck:  Neck supple, no JVD. No masses, thyromegaly or abnormal cervical nodes. Chest Wall:  well-healed sternotomy scar Lungs:  Clear bilaterally to auscultation and percussion. Abdomen:  Bowel sounds positive; abdomen soft and non-tender without masses, organomegaly, or hernias noted. No hepatosplenomegaly, obese Msk:  Back normal, normal gait. Muscle strength and tone normal. Extremities:  bilateral lower extremity edema moderate Neurologic:  Alert and oriented x 3. Skin:  Intact without lesions or rashes. Axillary Nodes:  no significant adenopathy Inguinal Nodes:  no significant adenopathy Psych:  Normal affect.   Detailed Cardiovascular Exam  Neck     Carotids: Carotids full and equal bilaterally without bruits.      Neck Veins: Normal, no JVD.    Heart    Inspection: no deformities or lifts noted.      Palpation: normal PMI with no thrills palpable.      Auscultation: regular rate and rhythm, S1, S2 without murmurs, rubs, gallops, or clicks.    Vascular    Abdominal Aorta: no palpable masses, pulsations, or audible bruits.      Femoral Pulses: normal femoral pulses bilaterally.      Pedal Pulses: normal pedal pulses bilaterally.      Radial Pulses: normal radial pulses bilaterally.      Peripheral Circulation: no clubbing, cyanosis,with normal capillary refill.     EKG  Procedure date:  10/17/2010  Findings:      sinus rhythm, rate 64, old inferior infarct, QTC prolonged, no acute ST-T wave changes.  Impression & Recommendations:  Problem # 1:  EDEMA (ICD-782.3) I will give 10 Lasix 20 mg q. day x3 days with potassium 10 mEq daily. I will prescribe compression knee-high stockings salt and fluid restriction. We also discussed keeping his feet elevated. He will then let me know if this improves his situation.  Problem # 2:  OBESITY, UNSPECIFIED (ICD-278.00) We had a long discussion about weight loss and I gave him specific instructions  Problem # 3:  CAD (ICD-414.00) He is having no new symptoms. No change in cardiovascular testing is suggested. Orders: EKG w/ Interpretation (93000)  Problem # 4:  HYPERTENSION (ICD-401.9) His blood pressure is controlled and he will continue the meds as listed.  Patient Instructions: 1)  Your physician recommends that you schedule a follow-up appointment in:  3 months in [place] office with Dr Antoine Poche 2)  Your physician has recommended you make the following change in your medication: Start Furosemide 20 mg once a day for 3 days only, take potassium chloride 10 mEq once a day for 3 days only 3)  Your physician has requested that you limit the intake of sodium (salt) in your diet to two  grams daily. Please see [facility] handout. 4)  Your physician recommends that you weigh, daily, at the same time every day, and in the same amount of clothing.  Please record your daily weights on the handout provided and bring it to your next appointment. 5)  Keep feet elevated above the level of your heart. 6)  Wear compression stockings daily. Prescriptions: POTASSIUM CHLORIDE CRYS CR 10 MEQ CR-TABS (POTASSIUM CHLORIDE CRYS CR) once a day as directed  #30 x 0   Entered by:   Charolotte Capuchin, RN   Authorized by:   [name], MD, [facility]   Signed by:   Charolotte Capuchin, RN on 10/17/2010   Method used:   Electronically to        [facility]  [address] (retail)       [address]       [place], [place]  [address]       Ph: [phone]       Fax: [phone]   RxID:   [id number] FUROSEMIDE 20  MG TABS (FUROSEMIDE) once a daily as directed  #30 x 0   Entered by:   Charolotte Capuchin, RN   Authorized by:   [name], MD, [facility]   Signed by:   Charolotte Capuchin, RN on 10/17/2010   Method used:   Electronically to        [facility]  [address] (retail)       [address]       [place], [place]  [address]       Ph: [phone]       Fax: [phone]   RxID:   [id number]

## 2010-11-05 ENCOUNTER — Encounter (HOSPITAL_BASED_OUTPATIENT_CLINIC_OR_DEPARTMENT_OTHER): Payer: MEDICARE | Admitting: Oncology

## 2010-11-05 ENCOUNTER — Other Ambulatory Visit (HOSPITAL_COMMUNITY): Payer: Self-pay | Admitting: Oncology

## 2010-11-05 DIAGNOSIS — C8319 Mantle cell lymphoma, extranodal and solid organ sites: Secondary | ICD-10-CM

## 2010-11-05 DIAGNOSIS — C8583 Other specified types of non-Hodgkin lymphoma, intra-abdominal lymph nodes: Secondary | ICD-10-CM

## 2010-11-05 LAB — CBC WITH DIFFERENTIAL/PLATELET
BASO%: 0.3 % (ref 0.0–2.0)
EOS%: 6 % (ref 0.0–7.0)
MCH: 32.1 pg (ref 27.2–33.4)
MCHC: 33.7 g/dL (ref 32.0–36.0)
MONO#: 0.4 10*3/uL (ref 0.1–0.9)
RDW: 13.5 % (ref 11.0–14.6)
WBC: 4.5 10*3/uL (ref 4.0–10.3)
lymph#: 0.9 10*3/uL (ref 0.9–3.3)

## 2010-11-05 LAB — GLUCOSE, CAPILLARY: Glucose-Capillary: 100 mg/dL — ABNORMAL HIGH (ref 70–99)

## 2010-11-05 LAB — COMPREHENSIVE METABOLIC PANEL
ALT: 14 U/L (ref 0–53)
AST: 22 U/L (ref 0–37)
Albumin: 4.2 g/dL (ref 3.5–5.2)
Alkaline Phosphatase: 64 U/L (ref 39–117)
Potassium: 4.1 mEq/L (ref 3.5–5.3)
Sodium: 141 mEq/L (ref 135–145)
Total Protein: 6.4 g/dL (ref 6.0–8.3)

## 2010-11-08 ENCOUNTER — Other Ambulatory Visit (HOSPITAL_COMMUNITY): Payer: Self-pay | Admitting: Oncology

## 2010-11-08 DIAGNOSIS — C8590 Non-Hodgkin lymphoma, unspecified, unspecified site: Secondary | ICD-10-CM

## 2010-11-10 LAB — COMPREHENSIVE METABOLIC PANEL
ALT: 18 U/L (ref 0–53)
ALT: 19 U/L (ref 0–53)
AST: 27 U/L (ref 0–37)
AST: 54 U/L — ABNORMAL HIGH (ref 0–37)
Albumin: 3.4 g/dL — ABNORMAL LOW (ref 3.5–5.2)
Albumin: 3 g/dL — ABNORMAL LOW (ref 3.5–5.2)
Alkaline Phosphatase: 60 U/L (ref 39–117)
Alkaline Phosphatase: 58 U/L (ref 39–117)
CO2: 29 mEq/L (ref 19–32)
Calcium: 8.3 mg/dL — ABNORMAL LOW (ref 8.4–10.5)
Calcium: 8.3 mg/dL — ABNORMAL LOW (ref 8.4–10.5)
Chloride: 105 mEq/L (ref 96–112)
Creatinine, Ser: 1.16 mg/dL (ref 0.4–1.5)
GFR calc Af Amer: >60 mL/min (ref >60)
GFR calc Af Amer: >60 mL/min (ref >60)
GFR calc non Af Amer: >60 mL/min (ref >60)
GFR calc non Af Amer: >60 mL/min (ref >60)
Glucose, Bld: 118 mg/dL — ABNORMAL HIGH (ref 70–99)
Potassium: 3.8 mEq/L (ref 3.5–5.1)
Potassium: 3.6 mEq/L (ref 3.5–5.1)
Sodium: 140 mEq/L (ref 135–145)
Sodium: 140 mEq/L (ref 135–145)
Total Bilirubin: 0.7 mg/dL (ref 0.3–1.2)
Total Protein: 5.5 g/dL — ABNORMAL LOW (ref 6.0–8.3)
Total Protein: 5.6 g/dL — ABNORMAL LOW (ref 6.0–8.3)

## 2010-11-10 LAB — BASIC METABOLIC PANEL
BUN: 18 mg/dL (ref 6–23)
BUN: 14 mg/dL (ref 6–23)
BUN: 18 mg/dL (ref 6–23)
CO2: 27 mEq/L (ref 19–32)
Calcium: 8.6 mg/dL (ref 8.4–10.5)
Calcium: 8.7 mg/dL (ref 8.4–10.5)
Chloride: 101 mEq/L (ref 96–112)
Chloride: 108 mEq/L (ref 96–112)
Creatinine, Ser: 1.07 mg/dL (ref 0.4–1.5)
Creatinine, Ser: 1.18 mg/dL (ref 0.4–1.5)
GFR calc Af Amer: >60 mL/min (ref >60)
GFR calc Af Amer: >60 mL/min (ref >60)
GFR calc Af Amer: >60 mL/min (ref >60)
GFR calc non Af Amer: >60 mL/min (ref >60)
GFR calc non Af Amer: >60 mL/min (ref >60)
Glucose, Bld: 118 mg/dL — ABNORMAL HIGH (ref 70–99)
Potassium: 4.2 mEq/L (ref 3.5–5.1)
Potassium: 4.1 mEq/L (ref 3.5–5.1)
Sodium: 139 mEq/L (ref 135–145)

## 2010-11-10 LAB — CARDIAC PANEL(CRET KIN+CKTOT+MB+TROPI)
CK, MB: 5.5 ng/mL — ABNORMAL HIGH (ref 0.3–4.0)
Relative Index: INVALID (ref 0.0–2.5)
Relative Index: 11 — ABNORMAL HIGH (ref 0.0–2.5)
Total CK: 43 U/L (ref 7–232)
Total CK: 55 U/L (ref 7–232)
Troponin I: 0.8 ng/mL (ref 0.00–0.06)
Troponin I: 12.11 ng/mL (ref 0.00–0.06)
Troponin I: 6.48 ng/mL (ref 0.00–0.06)

## 2010-11-10 LAB — CBC
HCT: 38.5 % — ABNORMAL LOW (ref 39.0–52.0)
HCT: 40 % (ref 39.0–52.0)
Hemoglobin: 12.9 g/dL — ABNORMAL LOW (ref 13.0–17.0)
MCH: 33 pg (ref 26.0–34.0)
MCH: 33 pg (ref 26.0–34.0)
MCHC: 34 g/dL (ref 30.0–36.0)
MCHC: 34.4 g/dL (ref 30.0–36.0)
MCHC: 33.8 g/dL (ref 30.0–36.0)
MCHC: 34.5 g/dL (ref 30.0–36.0)
MCHC: 34.9 g/dL (ref 30.0–36.0)
MCV: 97.2 fL (ref 78.0–100.0)
Platelets: 206 10*3/uL (ref 150–400)
Platelets: 194 10*3/uL (ref 150–400)
RBC: 3.96 MIL/uL — ABNORMAL LOW (ref 4.22–5.81)
RBC: 3.89 MIL/uL — ABNORMAL LOW (ref 4.22–5.81)
RBC: 4.08 MIL/uL — ABNORMAL LOW (ref 4.22–5.81)
RBC: 4.15 MIL/uL — ABNORMAL LOW (ref 4.22–5.81)
RDW: 14 % (ref 11.5–15.5)
RDW: 12.9 % (ref 11.5–15.5)
RDW: 12.9 % (ref 11.5–15.5)
WBC: 16.3 10*3/uL — ABNORMAL HIGH (ref 4.0–10.5)
WBC: 10.4 10*3/uL (ref 4.0–10.5)

## 2010-11-10 LAB — HEPARIN LEVEL (UNFRACTIONATED)
Heparin Unfractionated: 0.43 IU/mL (ref 0.30–0.70)
Heparin Unfractionated: 0.48 IU/mL (ref 0.30–0.70)
Heparin Unfractionated: 0.56 IU/mL (ref 0.30–0.70)
Heparin Unfractionated: 0.67 IU/mL (ref 0.30–0.70)

## 2010-11-10 LAB — DIFFERENTIAL
Basophils Absolute: 0 10*3/uL (ref 0.0–0.1)
Basophils Relative: 0 % (ref 0–1)
Basophils Relative: 0 % (ref 0–1)
Eosinophils Absolute: 0.1 10*3/uL (ref 0.0–0.7)
Eosinophils Absolute: 0.6 10*3/uL (ref 0.0–0.7)
Eosinophils Relative: 0 % (ref 0–5)
Lymphocytes Relative: 2 % — ABNORMAL LOW (ref 12–46)
Lymphs Abs: 0.5 10*3/uL — ABNORMAL LOW (ref 0.7–4.0)
Monocytes Absolute: 0.9 10*3/uL (ref 0.1–1.0)
Monocytes Relative: 5 % (ref 3–12)
Monocytes Relative: 7 % (ref 3–12)
Neutro Abs: 7 10*3/uL (ref 1.7–7.7)
Neutrophils Relative %: 91 % — ABNORMAL HIGH (ref 43–77)
Neutrophils Relative %: 72 % (ref 43–77)

## 2010-11-10 LAB — CK TOTAL AND CKMB (NOT AT ARMC)
Relative Index: INVALID (ref 0.0–2.5)
Relative Index: 12.6 — ABNORMAL HIGH (ref 0.0–2.5)
Total CK: 81 U/L (ref 7–232)
Total CK: 381 U/L — ABNORMAL HIGH (ref 7–232)

## 2010-11-10 LAB — PROTIME-INR
INR: 1.03 (ref 0.00–1.49)
Prothrombin Time: 14.2 seconds (ref 11.6–15.2)
Prothrombin Time: 13.4 seconds (ref 11.6–15.2)

## 2010-11-10 LAB — POCT CARDIAC MARKERS
CKMB, poc: 2.1 ng/mL (ref 1.0–8.0)
Myoglobin, poc: 234 ng/mL (ref 12–200)
Myoglobin, poc: 280 ng/mL (ref 12–200)

## 2010-11-10 LAB — TSH
TSH: 6.91 u[IU]/mL — ABNORMAL HIGH (ref 0.350–4.500)
TSH: 2.694 u[IU]/mL (ref 0.350–4.500)

## 2010-11-10 LAB — HEMOGLOBIN A1C
Hgb A1c MFr Bld: 6.2 % — ABNORMAL HIGH (ref 4.6–6.1)
Mean Plasma Glucose: 137 mg/dL — ABNORMAL HIGH (ref <117)
Mean Plasma Glucose: 131 mg/dL

## 2010-11-10 LAB — TROPONIN I: Troponin I: 6.51 ng/mL (ref 0.00–0.06)

## 2010-11-10 LAB — LIPID PANEL
HDL: 39 mg/dL — ABNORMAL LOW (ref >39)
HDL: 29 mg/dL — ABNORMAL LOW (ref >39)
Total CHOL/HDL Ratio: 4.8 RATIO
VLDL: 17 mg/dL (ref 0–40)

## 2010-11-10 LAB — BRAIN NATRIURETIC PEPTIDE: Pro B Natriuretic peptide (BNP): 232 pg/mL — ABNORMAL HIGH (ref 0.0–100.0)

## 2010-11-10 LAB — APTT: aPTT: 24 seconds (ref 24–37)

## 2010-12-01 LAB — BASIC METABOLIC PANEL
GFR calc non Af Amer: 57 mL/min — ABNORMAL LOW (ref >60)
Potassium: 4.8 mEq/L (ref 3.5–5.1)
Sodium: 140 mEq/L (ref 135–145)

## 2010-12-01 LAB — HEMOGLOBIN AND HEMATOCRIT, BLOOD
HCT: 40.5 % (ref 39.0–52.0)
Hemoglobin: 14 g/dL (ref 13.0–17.0)

## 2011-01-01 ENCOUNTER — Encounter (HOSPITAL_COMMUNITY): Payer: Self-pay

## 2011-01-01 ENCOUNTER — Ambulatory Visit (HOSPITAL_COMMUNITY)
Admission: RE | Admit: 2011-01-01 | Discharge: 2011-01-01 | Disposition: A | Discharge status: Home / Self Care | Payer: MEDICARE | Source: Ambulatory Visit | Attending: Oncology | Admitting: Oncology

## 2011-01-01 ENCOUNTER — Other Ambulatory Visit (HOSPITAL_COMMUNITY): Payer: Self-pay | Admitting: Oncology

## 2011-01-01 DIAGNOSIS — C8589 Other specified types of non-Hodgkin lymphoma, extranodal and solid organ sites: Secondary | ICD-10-CM | POA: Insufficient documentation

## 2011-01-01 DIAGNOSIS — C8590 Non-Hodgkin lymphoma, unspecified, unspecified site: Secondary | ICD-10-CM

## 2011-01-01 HISTORY — DX: Malignant (primary) neoplasm, unspecified: C80.1

## 2011-01-01 HISTORY — DX: Essential (primary) hypertension: I10

## 2011-01-01 HISTORY — DX: Non-Hodgkin lymphoma, unspecified, unspecified site: C85.90

## 2011-01-01 LAB — CBC WITH DIFFERENTIAL/PLATELET
Eosinophils Absolute: 0.4 10*3/uL (ref 0.0–0.5)
MONO#: 0.6 10*3/uL (ref 0.1–0.9)
MONO%: 13 % (ref 0.0–14.0)
NEUT#: 2.3 10*3/uL (ref 1.5–6.5)
RBC: 4.39 10*6/uL (ref 4.20–5.82)
RDW: 13.4 % (ref 11.0–14.6)
WBC: 4.4 10*3/uL (ref 4.0–10.3)

## 2011-01-01 LAB — CMP (CANCER CENTER ONLY)
Albumin: 3.6 g/dL (ref 3.3–5.5)
Alkaline Phosphatase: 64 U/L (ref 26–84)
CO2: 28 mEq/L (ref 18–33)
Glucose, Bld: 88 mg/dL (ref 73–118)
Potassium: 4.6 mEq/L (ref 3.3–4.7)
Sodium: 145 mEq/L (ref 128–145)
Total Protein: 6.8 g/dL (ref 6.4–8.1)

## 2011-01-01 MED ORDER — IOHEXOL 300 MG/ML  SOLN
100.0000 mL | Freq: Once | INTRAMUSCULAR | Status: AC | PRN
  Administered 2011-01-01: 100 mL via INTRAVENOUS

## 2011-01-04 ENCOUNTER — Encounter (HOSPITAL_BASED_OUTPATIENT_CLINIC_OR_DEPARTMENT_OTHER): Payer: MEDICARE | Admitting: Oncology

## 2011-01-04 DIAGNOSIS — C8319 Mantle cell lymphoma, extranodal and solid organ sites: Secondary | ICD-10-CM

## 2011-01-07 ENCOUNTER — Other Ambulatory Visit: Payer: Self-pay | Admitting: Cardiovascular Disease

## 2011-01-08 ENCOUNTER — Encounter: Payer: Self-pay | Admitting: Cardiology

## 2011-01-08 NOTE — Letter (Signed)
September 16, 2007    Samul Dada, M.D.  [address]  [place], [place] [address]   RE:  [name], [name]  MRN:  [id number]  /  DOB:  13-Oct-1932   Dear Dr. Arline Asp:   This letter concerns Mr. Martin Morris [facility] medical record  number [id number], date of birth 03/11/1933).  You manage this  patient for lymphoma.  I have been seeing him for coronary disease.  He  used to be on Crestor.  He seemed to have tolerated this in the past but  there may have been some muscle aches.  He says that you took him off of  this.  He seems to think it was because of some interaction with the  medications for his lymphoma or previous therapy.  I have asked him to  discuss this with you when he sees you again.  If you do not know of any  contraindication, would you please restart the Crestor at 5 mg or give  my office a call.  Once this is done, he should have a lipid profile and  liver enzymes checked in about 8 weeks.   If you have any questions or would like to discuss this patient, please  do not hesitate to give me a call.  My beeper number is [phone].  My  cell phone number is [phone].  Thanks for your help with this.    Sincerely,      [name], MD, [facility]  Electronically Signed    JH/MedQ  DD: 09/16/2007  DT: 09/16/2007  Job #: [id number]

## 2011-01-08 NOTE — Assessment & Plan Note (Signed)
[facility]                         GASTROENTEROLOGY OFFICE NOTE   DAYSEN, GUNDRUM                    MRN:          [id number]  DATE:01/01/2007                            DOB:          10/12/1932    PROBLEM:  Lymphoma.  Martin Morris has returned for a scheduled gastrointestinal followup.  He  has a mantle cell lymphoma for which he is under Dr. Gerarda Fraction.  Murinson's care.  He has biliary tract obstruction for which he has an  indwelling plastic stent.  Mr. Koepp actually has no gastrointestinal  complaints including abdominal pain, jaundice, pleuritis or fevers.  Liver tests on Dec 29, 2006 were entirely normal.   EXAMINATION:  Pulse 84, blood pressure 132/80.   IMPRESSION:  _______Common bile duct___ obstruction secondary to  lymphoma.   RECOMMENDATIONS:  Follow LFTs on a monthly basis.  Since he is seeing  Dr. Arline Asp for his chemotherapy, I have requested that Dr. Arline Asp  draw his LFTs every month.  Should his LFTs become abnormal, which may  be a indication of early stent exclusion, I will change the stent.  In  the absence of this I will see Mr. Heaphy in approximately 3 months.     Barbette Hair. Arlyce Dice, MD,FACG  Electronically Signed    RDK/MedQ  DD: 01/01/2007  DT: 01/01/2007  Job #: [id number]   cc:   Samul Dada, M.D.

## 2011-01-08 NOTE — Assessment & Plan Note (Signed)
[facility]                            CARDIOLOGY OFFICE NOTE   BONIFACE, GOFFE                    MRN:          [id number]  DATE:03/02/2008                            DOB:          [birth year]    PRIMARY CARE PHYSICIAN:  Samuel Jester.   REASON FOR PRESENTATION:  The patient with coronary disease.   HISTORY OF PRESENT ILLNESS:  The patient is now 77 years old.  He  presents for routine followup.  Since I last saw him he has been  describing some chest discomfort.  This has actually been going on for a  while but he states it is fairly reproducible with exertion.  He tries  to walk around the block.  He does not do this very often because he is  fatigued.  He thinks related to his chemotherapy.  However, he routinely  gets some substernal chest pressure.  It goes away when he stops what he  is doing.  He does not have any radiation to his neck or to his arms.  There is no associated nausea, vomiting, or diaphoresis.  He does not  have any severe shortness of breath, described no PND or orthopnea.  His  fatigued related, he thinks, to his chemotherapy for lymphoma.  He has  no resting chest discomfort.   PAST MEDICAL HISTORY:  Coronary artery disease (status post CABG in 2003  with a LIMA to the LAD, SVG to diagonal, SVG sequential to OM-1 and PDA.  Catheterization in 2005 demonstrated that the sequential vein graft had  occluded to the PDA but was open to the OM.  The vein graft to diagonal  was occluded.  The LIMA was patent.  He had 75-80% left main stenosis  and had this stented for protection of the circumflex.  This was a  Cypher stent placed by Dr. Samule Ohm.),  bilateral carotid plaque (last  Doppler January 2009, 40-59% right, 0-39% left), hypertension, benign  prostatic hypertrophy, dyslipidemia, malignant thymoma resected and  treated with radiation, mantle cell lymphoma with ongoing chemotherapy.   ALLERGIES:  None.    MEDICATIONS:  1. Aspirin 325 mg daily.  2. Plavix 75 mg daily.  3. Lisinopril 20 mg daily.  4. Hydrochlorothiazide 25 mg daily.  5. Atenolol 50 mg b.i.d.  6. Allopurinol 300 mg daily.   REVIEW OF SYSTEMS:  As stated in the HPI, and otherwise, negative for  other systems.   PHYSICAL EXAMINATION:  GENERAL:  The patient is in no distress.  VITAL SIGNS:  Blood pressure 168/98, heart rate 63 and regular, weight  208 pounds.  HEENT:  Eyes are unremarkable, pupils equal, round, and reactive, fundi  not visualized, oral mucosa unremarkable.  NECK:  No jugular distention at 45 degrees, carotid upstroke brisk and  symmetrical.  No bruits, no thyromegaly.  LYMPHATICS:  No cervical, axillary, or inguinal adenopathy.  LUNGS:  Clear to auscultation bilaterally.  BACK:  No costovertebral angle tenderness.  CHEST:  Well-healed sternotomy scar.  HEART:  PMI not displaced or sustained, S1 and S2 within normal limits,  no S3, no S4, no clicks, rubs,  no murmurs.  ABDOMEN:  Obese, positive bowel sounds normal in frequency and pitch, no  bruits, no rebound, no guarding, no midline pulsatile mass, no  organomegaly.  SKIN:  No rashes, no nodules.  EXTREMITIES:  2+ upper pulses, 2+ femorals, popliteals, dorsalis pedis,  and post tibialis, moderate bilateral lower extremity edema to above the  ankles.  NEUROLOGIC:  Oriented to person, place, an time, cranial nerves II  through XII are grossly intact, motor grossly intact.   EKG sinus rhythm, rate 63, axis within normal limits, intervals within  normal limits, probable old inferior infarct, no acute ST-T wave  changes.   ASSESSMENT AND PLAN:  1. Coronary disease.  The patient has coronary disease as described.      He has a left main stent.  He has some chest discomfort with      exertion.  He has not been screened with a stress perfusion study      since his stent in 2005.  Therefore, he needs this.  He probably      would not be able to ambulate,  so will have an adenosine      Cardiolite.  Further evaluation based on these results.  He will      continue with aggressive risk reduction.  2. Carotid stenosis.  We will follow it up again in January.  3. Dyslipidemia, per Dr. Charm Barges with goal LDL less than 100, HDL      greater than 40.  4. Hypertension.  Blood pressure is elevated today.  He has had his      lisinopril dose increased.  He may need to go to 40 mg.  He is to      keep a blood pressure diary and have titration of his lisinopril to      40 mg if his blood pressure is not at target.  He needs therapeutic      lifestyle changes to include weight loss.  5. Followup.  I would like to see him back in 6 months or sooner if      needed.     [name], MD, [facility]  Electronically Signed    JH/MedQ  DD: 03/02/2008  DT: 03/03/2008  Job #: [id number]   cc:   Samuel Jester

## 2011-01-08 NOTE — Assessment & Plan Note (Signed)
[name]                            CARDIOLOGY OFFICE NOTE   Martin Morris, Martin Morris                    MRN:          [id number]  DATE:08/06/2007                            DOB:          Jul 09, 1933    PRIMARY CARE PHYSICIAN:  Martin Morris, M.D.   REASON FOR PRESENTATION:  Evaluate patient with coronary disease.   HISTORY OF PRESENT ILLNESS:  The patient is 77 years old.  She was  previously seen by Martin Morris.  He has coronary disease status post CABG  as described below.  He is currently battling mantle cell lymphoma.  He  is being treated with Rituxan.  He has not had any recent cardiovascular  symptoms, though he has been relatively sedentary.  He does not describe  any chest discomfort, neck or arm discomfort.  He has no palpitations,  presyncope, or syncope.  He has no PND or orthopnea.   I do note that prior to his bypass in 2003, he had carotid Dopplers  demonstrating moderate bifurcation plaque bilaterally.  I do not see  any followup of this.   PAST MEDICAL HISTORY:  1. Coronary artery disease (status post CABG in 2003 with a LIMA to      the LAD, SVG to diagonal, SVG sequential to OM and PDA).  2. Bilateral carotid plaque.  3. Hypertension.  4. Benign prostatic hypertrophy.  5. Hyperlipidemia.  6. Malignant thymoma, resected and treated with radiation.  7. Mantle cell lymphoma.   ALLERGIES:  NONE.   MEDICATIONS:  1. Aspirin 325 mg daily.  2. Plavix 75 mg daily.  3. Atenolol 25 mg daily.  4. Lisinopril 20 mg daily.  5. Isosorbide 30 mg daily.   REVIEW OF SYSTEMS:  As stated in the HPI and otherwise negative for  other symptoms.   PHYSICAL EXAMINATION:  GENERAL:  The patient is in no acute distress.  VITAL SIGNS:  Blood pressure 184/99, heart rate 80 and regular.  Weight  203 pounds, body mass index of 30.  HEENT:  Eyelids unremarkable.  Pupils are equal, round, and reactive to  light.  Fundi not visualized.  Oral mucosa  unremarkable.  NECK:  No jugular venous distension at 45 degrees.  Carotid upstrokes  brisk and symmetric.  No bruits or thyromegaly.  LYMPHATICS:  No lymphadenopathy.  LUNGS:  Clear to auscultation bilaterally.  BACK:  No costovertebral angle tenderness.  CHEST:  Unremarkable.  HEART:  PMI not displaced nor sustained.  S1 and S2 within normal  limits.  No S3, no S4.  No clicks, rubs, or murmurs.  ABDOMEN:  Mildly obese.  Positive bowel sounds, normal in frequency and  pitch.  No bruits.  No rebound, guarding.  No midline pulsatile masses.  No organomegaly, no splenomegaly.  SKIN:  No rashes, no nodules.  EXTREMITIES:  2+ pulses.  No edema.  No cyanosis or clubbing.  NEUROLOGIC:  Oriented to person, place, and time.  Cranial nerves II-XII  grossly intact.  Motor grossly intact.   EKG:  Sinus rhythm, rate 80, old inferior infarct, axis within normal  limits, no acute ST-T wave  changes.   ASSESSMENT AND PLAN:  1. Coronary disease.  The patient has coronary disease with a coronary      artery bypass graft 5 years ago.  He did have a stress perfusion      study in 2004 with no evidence of ischemia.  No further      cardiovascular testing is suggested.  2. Peripheral vascular disease.  He has not had carotid Dopplers since      his bypass surgery, and his obstruction was described as moderate.      I will go ahead and order these.  3. Risk reduction.  He was taken off of statin by his oncologist.  I      am not clear why, but he thinks it was an interference with his      current therapy.  I will defer to Martin Morris and suggest that he      be back on Crestor if there is not a contraindication.  4. Hypertension.  Blood pressure is not controlled.  I am going to      take the liberty of increasing his atenolol to 25 mg b.i.d. and      adding hydrochlorothiazide 25 mg daily.  He is going to get a BMET      today.  He will get a BMET in 2 weeks and likely will need some      potassium.   He is to increase his potassium-containing foods.  He      will remain on the current dose of lisinopril.  5. Lymphoma, managed by Martin Morris.     Martin Rotunda, MD, [facility]  Electronically Signed    Martin Morris  DD: 08/06/2007  DT: 08/07/2007  Job #: [id number]   cc:   Martin Morris

## 2011-01-08 NOTE — Assessment & Plan Note (Signed)
[facility]                            CARDIOLOGY OFFICE NOTE   Martin Morris, Martin Morris                    MRN:          [id number]  DATE:09/16/2007                            DOB:          10/24/1932    PRIMARY CARE PHYSICIAN:  Samuel Jester, D.O.   REASON FOR PRESENTATION:  Evaluate patient with coronary disease.   HISTORY OF PRESENT ILLNESS:  The patient is 77 years old.  He presents  for followup of his coronary disease.  In particular, after his last  appointment in December, he called complaining of palpitations and  increased blood pressure.  In early January we had him increase his  atenolol to 50 mg b.i.d.  He says he has felt better since that time.  He says his wife keeps his blood pressure and it is actually well  controlled.  He is denying any palpitations, presyncope or syncope.  He  has been having no chest discomfort, neck or arm discomfort.  He has  been fatigued since getting therapy for lymphoma.  This has kept him  from being particularly active.  However, he has had no specific  limitations.   PAST MEDICAL HISTORY:  Coronary artery disease (status post CABG in 2003  with a LIMA to the LAD, SVG to diagonal, SVG  sequential to OM1 and  PDA), bilateral carotid plaque (last Doppler 2003), hypertension, benign  prostatic hypertrophy, dyslipidemia, malignant thymoma resected and  treated with radiation, mantel cell lymphoma.   ALLERGIES:  None.   MEDICAL DECISION MAKING:  1. Aspirin 325 mg daily.  2. Plavix 75 mg daily.  3. Lisinopril 20 mg daily.  4. Isosorbide 30 mg daily.  5. Hydrochlorothiazide 25 mg daily.  6. Atenolol 50 mg b.i.d.   REVIEW OF SYSTEMS:  As stated in the HPI and otherwise negative for  other systems.   PHYSICAL EXAMINATION:  The patient is in no distress.  Blood pressure:  160/96.  Heart rate:  68, regular.  Weight:  205 pounds.  Body mass  index:  30.  HEENT: Eyelids unremarkable.  Pupils equal,  rebound and reactive to  light.  Fundi not visualized.  Oral mucosa unremarkable.  NECK:  No jugular venous distention at 45 degrees.  Carotid upstroke  brisk and symmetric.  No bruits.  No thyromegaly.  LYMPHATICS:  No adenopathy.  LUNGS:  Clear to auscultation bilaterally.  BACK:  No costovertebral  angle tenderness.  CHEST:  Well healed sternotomy scar.  HEART:  PMI not displaced or sustained.  S1, S2 within normal limits.  No S3, S4.  No clicks, rubs or murmurs.  ABDOMEN:  Obese.  Positive bowel sounds normal in frequency and pitch.  No bruits, rebound, guarding.  No midline pulsatile mass.  No  hepatomegaly, no splenomegaly.  AKIN:  No rashes.  No nodules.  EXTREMITIES:  2+ pulses.  Right greater than left lower extremity edema.  No cyanosis or clubbing.  Well healed saphenous vein graft harvest  sites.  NEURO:  Oriented to person, place and time.  Cranial nerves II-XII  grossly intact. Motor grossly intact.   ASSESSMENT/PLAN:  1. Hypertension.  Blood pressure is elevated here.  However, he keeps      a blood pressure diary at home and says that it well within the      140/90 guideline and closer to the 120/70 ideal.  He is not feeling      the palpitations with the increased dose of beta blocker.  At this      point he will continue with this regimen and his wife sill continue      to check his blood pressure.  He is advised to undergo therapeutic      lifestyle changes (TLC) to include weight loss.  2. Peripheral vascular disease.  He was to have carotid Dopplers.  I      do not see that these were done and we will schedule these.  3. Risk reduction.  I will send a letter today to Dr. Arline Asp to ask      if he can restart Crestor.  This was stopped and I am not clear the      reasons.  4. Lymphoma.  The patient will continue with Dr. Arline Asp.  5. Followup.  We will see the patient again in six months or sooner if      needed.     [name], MD, [facility]   Electronically Signed    JH/MedQ  DD: 09/16/2007  DT: 09/16/2007  Job #: [id number]   cc:   Samuel Jester

## 2011-01-08 NOTE — Assessment & Plan Note (Signed)
[facility]                         GASTROENTEROLOGY OFFICE NOTE   Martin Morris, Martin Morris                    MRN:          [id number]  DATE:05/08/2007                            DOB:          July 19, 1933    PROBLEM:  Bile duct obstruction.   Martin Morris has returned for a scheduled followup.  He is under Dr.  Mamie Levers care for a non-Hodgkin's lymphoma.  He has received several  courses of chemotherapy.  He has a distal bile duct stricture secondary  to retroperitoneal lymphadenopathy.  He has no GI complaints included  abdominal pain or pruritus.  He is without fever.  Blood pressure has  been checked by Dr. Arline Asp, and presumably his LFTs have remained  normal.   EXAM:  Pulse 64, blood pressure 132/84, weight 208.   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   RECOMMENDATIONS:  ERCP with stent removal.  With a plastic stent in  place, I expect the obstruction to begin.  At the same time, his last  PET scan from July 2008, demonstrated decrease in the size and the  metabolic activity of his retroperitoneal nodes.  Hopefully, he no  longer is strictured in the bile duct and will not require a stent  placement.  If he still has a stricture, then I will place a new biliary  stent.     Barbette Hair. Arlyce Dice, MD,FACG  Electronically Signed    RDK/MedQ  DD: 05/08/2007  DT: 05/09/2007  Job #: [id number]   cc:   Samul Dada, M.D.

## 2011-01-09 ENCOUNTER — Ambulatory Visit (INDEPENDENT_AMBULATORY_CARE_PROVIDER_SITE_OTHER): Payer: MEDICARE | Admitting: Cardiology

## 2011-01-09 ENCOUNTER — Encounter: Payer: Self-pay | Admitting: Cardiology

## 2011-01-09 DIAGNOSIS — E669 Obesity, unspecified: Secondary | ICD-10-CM

## 2011-01-09 DIAGNOSIS — E785 Hyperlipidemia, unspecified: Secondary | ICD-10-CM

## 2011-01-09 DIAGNOSIS — I2510 Atherosclerotic heart disease of native coronary artery without angina pectoris: Secondary | ICD-10-CM

## 2011-01-09 DIAGNOSIS — I739 Peripheral vascular disease, unspecified: Secondary | ICD-10-CM

## 2011-01-09 DIAGNOSIS — I10 Essential (primary) hypertension: Secondary | ICD-10-CM

## 2011-01-09 DIAGNOSIS — R609 Edema, unspecified: Secondary | ICD-10-CM

## 2011-01-09 MED ORDER — FUROSEMIDE 20 MG PO TABS: ORAL_TABLET | ORAL | Status: DC

## 2011-01-09 NOTE — Assessment & Plan Note (Signed)
He does have increased edema.  I will prescribe Lasix 20 mg daily x three days.  He will take an extra 20 meq of KCL for two days.  I will prescribe knee high compression stockings.

## 2011-01-09 NOTE — Patient Instructions (Addendum)
Please take Furosemide 20 mg a day for three days along with potassium chloride 20 mEq daily Wear knee high compression hose daily Continue all other medications Follow up with Dr. Antoine Poche in 6 months

## 2011-01-09 NOTE — Assessment & Plan Note (Signed)
The patient has had no new chest discomfort. We will continue with risk reduction.

## 2011-01-09 NOTE — Assessment & Plan Note (Signed)
His blood pressure is controlled. He will continue meds as listed. 

## 2011-01-09 NOTE — Progress Notes (Signed)
HPI Patient presents for followup of his known coronary disease. Since I last saw him he has had no new cardiovascular complaints. I saw him after his most recent hospitalization last fall. Currently he denies any chest discomfort, neck or arm discomfort. He has no significant shortness of breath, PND or orthopnea. He is elevations, presyncope or syncope. He does have some chronic lower extremity swelling which bothers him. He has been trying to watch his diet and has actually lost 11 pounds  Allergies  Allergen Reactions  . Atorvastatin     Current Outpatient Prescriptions  Medication Sig Dispense Refill  . amLODipine (NORVASC) 2.5 MG tablet Take 2.5 mg by mouth daily.        Marland Kitchen aspirin 325 MG tablet Take 325 mg by mouth daily.        Marland Kitchen atenolol (TENORMIN) 50 MG tablet Take 50 mg by mouth daily.        . citalopram (CELEXA) 20 MG tablet Take 20 mg by mouth daily.        . clopidogrel (PLAVIX) 75 MG tablet Take 75 mg by mouth daily.        . isosorbide mononitrate (IMDUR) 60 MG 24 hr tablet TAKE ONE TABLET BY MOUTH EVERY DAY  30 tablet  6  . Multiple Vitamin (MULTIVITAMIN) tablet Take 1 tablet by mouth daily.        . nitroGLYCERIN (NITROSTAT) 0.4 MG SL tablet Place 0.4 mg under the tongue every 5 (five) minutes as needed.        . potassium chloride SA (K-DUR,KLOR-CON) 20 MEQ tablet Take 20 mEq by mouth 2 (two) times daily.        . pravastatin (PRAVACHOL) 80 MG tablet Take 80 mg by mouth daily.        Marland Kitchen DISCONTD: furosemide (LASIX) 20 MG tablet Take 20 mg by mouth 2 (two) times daily.          Past Medical History  Diagnosis Date  . thymus ca dx'd 2003    xrt comp 2003  . NHL (non-Hodgkin's lymphoma) dx'd 2008    chemo comp 10/2010; rituxin comp 10/2010  . Hypertension   . CAD 11/12/2007  . HYPERLIPIDEMIA 11/12/2007  . HYPERTENSION 11/12/2007  . PERIPHERAL VASCULAR DISEASE 11/12/2007  . ARTHRITIS 11/12/2007  . BENIGN PROSTATIC HYPERTROPHY, HX OF 11/12/2007  . 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  Edema 10/17/2010  . INGUINAL HERNIAS, BILATERAL 11/10/2006  . MANTLE CELL LYMPHOMA INTRA-ABDOMINAL LYMPH NODES 11/12/2007  . Obesity, unspecified 07/05/2009  . SLEEP APNEA 11/12/2007  . History of PTCA 2005    Past Surgical History  Procedure Date  . Coronary artery bypass graft     ROS:  As stated in the HPI and negative for all other systems.  PHYSICAL EXAM BP 120/72  Pulse 62  Resp 16  Ht 5\' 9"  (1.753 m)  Wt 201 lb (91.173 kg)  BMI 29.68 kg/m2 GENERAL:  Well appearing HEENT:  Pupils equal round and reactive, fundi not visualized, oral mucosa unremarkable NECK:  No jugular venous distention, waveform within normal limits, carotid upstroke brisk and symmetric, no bruits, no thyromegaly LYMPHATICS:  No cervical, inguinal adenopathy LUNGS:  Clear to auscultation bilaterally BACK:  No CVA tenderness CHEST:  Well healed sternotomy scar. HEART:  PMI not displaced or sustained,S1 and S2 within normal limits, no S3, no S4, no clicks, no rubs, no murmurs ABD:  Flat, positive bowel sounds normal in frequency in pitch, no bruits, no rebound, no guarding, no midline  pulsatile mass, no hepatomegaly, no splenomegaly EXT:  2 plus pulses throughout, moderate right greater than left lower ext edema, no cyanosis no clubbing SKIN:  No rashes no nodules NEURO:  Cranial nerves II through XII grossly intact, motor grossly intact throughout PSYCH:  Cognitively intact, oriented to person place and time   ASSESSMENT AND PLAN

## 2011-01-09 NOTE — Assessment & Plan Note (Signed)
As I dictate this note I am getting him to fax me his carotid results from a couple of years ago. We will follow this up as needed after I see these results.

## 2011-01-09 NOTE — Assessment & Plan Note (Signed)
I applied his weight loss and encouraged more of the same.

## 2011-01-11 NOTE — H&P (Signed)
Martin Morris, Martin Morris                       ACCOUNT NO.:  [id number]   MEDICAL RECORD NO.:  [id number]                   PATIENT TYPE:  INP   LOCATION:  3710                                 FACILITY:  [facility]   PHYSICIAN:  Salvadore Farber, M.D.             DATE OF BIRTH:  03-17-1933   DATE OF ADMISSION:  09/02/2003  DATE OF DISCHARGE:                                HISTORY & PHYSICAL   CHIEF COMPLAINT:  Chest pain.   HISTORY OF PRESENT ILLNESS:  Martin Morris is a 77 year old gentleman status  post coronary artery bypass grafting by Dr. Donata Clay in August 2003.  At  the time of coronary artery bypass grafting, a malignant thymoma was  identified and resected.  He subsequently had adjuvant radiation therapy.  Dr. Donata Clay is currently evaluating a questionable lung mass noted on MRI  done in [place].   Martin Morris has suffered exertional dyspnea ever since his bypass surgery.  He can walk approximately 1/4 of a mile without stopping.  There has been no  orthopnea, PND, or edema.  He has had chest discomfort with vigorous  exertion, but none at rest previously.  He has complained of some central  chest discomfort which appeared to be CABG related neuropathy previously.  He now presents with 48 hours of left-sided chest discomfort which has been  continuous, but ranging from 3 to 8/10.  There was no radiation, no  associated symptoms except some mild nausea.  Pain has not been worse with  exertion.   PAST MEDICAL HISTORY:  1. Coronary artery disease, status post CABG as above.  2. Dyslipidemia.  3. BPH.  4. 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   ALLERGIES:  Intolerant to ZOCOR and LIPITOR causing weakness.   CURRENT MEDICATIONS:  1. Aspirin 325 mg daily.  2. Doxazosin 4 mg daily.  3. Altace 5 mg daily.  4. Aleve.  5. Multivitamin.   SOCIAL HISTORY:  The patient lives in [place] with his wife.  He is a  retired Veterinary surgeon.  He is accompanied today  by his wife and daughter.  No  tobacco or alcohol use.   FAMILY HISTORY:  Father died at 94 of myocardial infarction.  Mother died at  75 of coronary disease.   REVIEW OF SYSTEMS:  His right leg has been tingling over the past week.  Also has had arthralgias over the week, occasional bright red blood per  rectum, none recently.  Otherwise, negative in detail except as above.   PHYSICAL EXAMINATION:  GENERAL:  This is a generally well-appearing man in  no distress.  VITAL SIGNS:  Heart rate 63, blood pressure 143/92, temperature 97.8, oxygen  saturation 94% on room air.  NECK:  He has no jugular venous distention.  There is no thyromegaly.  LUNGS:  Clear to auscultation.  He has a non-displaced point of maximal  cardiac impulse.  HEART:  Regular  rate and rhythm without murmurs, rubs, or gallops.  ABDOMEN:  Soft, nontender, nondistended.  There is no hepatosplenomegaly.  Bowel sounds are normal.  EXTREMITIES:  Warm without cyanosis, clubbing, edema, or ulceration.  Carotid pulses are 2+ bilaterally without bruits.  Femoral pulses are 2+  bilaterally without bruits.  Left DP and PT 2+.  Right PT 2+, right DP  faint.   LABORATORY DATA:  Electrocardiogram demonstrates normal sinus rhythm at 69  beats per minute with PVCs.  There are nonspecific ST-T abnormalities which  are unchanged compared with prior.   Laboratory studies remarkable for a hematocrit of 43, platelets 218, INR 1,  point of care enzymes negative x2.   IMPRESSION/RECOMMENDATION:  1. Chest discomfort:  The patient has had atypical chest discomfort ever     since coronary artery bypass grafting.  However, this episode is somewhat     different.  Arguing strongly against myocardial ischemia as the etiology     of this chest discomfort is it is 48 hours duration with negative cardiac     enzymes and an unchanged electrocardiogram.  However, the patient has had     exertional dyspnea which I was concerned may be due to  recurrent     ischemia.  A recent Cardiolite demonstrated a mild to moderate defect in     much of the inferior wall which was primarily fixed, but with sub-peri-     infarct ischemia.  There was also a second small to moderate sized     reversible defect in the anterior lateral wall.  While I think his chest     pain is unlikely to be due to ischemia, it is certainly possible that his     dyspnea it.  We will therefore go ahead and proceed with catheterization     which I had previously recommended.  In the emergency room he has been     started on heparin and nitroglycerin.  We will continue these until     results of catheterization are available.  2. Dyslipidemia.  Intolerant to statins.  We will add Zetia.  3. History of thymoma.  The patient is very concerned that this is the     source of his current problems.  We will discuss further evaluation with     Dr. Donata Clay.                                                Salvadore Farber, M.D.    WED/MEDQ  D:  09/02/2003  T:  09/02/2003  Job:  [id number]   cc:   Colon Flattery, MD  [address]  [place]  [place] [address]  Fax: [phone]

## 2011-01-11 NOTE — Cardiovascular Report (Signed)
NAMEALEXZANDER, Martin Morris                       ACCOUNT NO.:  [id number]   MEDICAL RECORD NO.:  [id number]                   PATIENT TYPE:  INP   LOCATION:  3710                                 FACILITY:  [facility]   PHYSICIAN:  Salvadore Farber, M.D.             DATE OF BIRTH:  21-Sep-1932   DATE OF PROCEDURE:  09/06/2003  DATE OF DISCHARGE:                              CARDIAC CATHETERIZATION   PROCEDURE:  Drug-eluting stent placement to protected left main coronary.   INDICATIONS:  Mr. Martin Morris is a 77 year old gentleman status post coronary  artery bypass grafting in August 2003.  He has been troubled by dyspnea with  minimal exertion despite preserved systolic function.  It is felt that this  is an anginal equivalent.  He was admitted on January 7 with atypical chest  discomfort.  He underwent cardiac catheterization that day by Dr. Riley Kill  demonstrating substantial progression of ostial left main disease from 40%  to now 75-80% stenosis.  The sequential vein graft to obtuse marginal and  PDA is occluded in its segment extending to the PDA, but patent to the OM.  The vein graft to the diagonal is occluded.  The LIMA to LAD is patent.  Thus, the left main stenosis limits flow to the diagonals and PDA.  There is  also a severe stenosis in the PDA at the site of the vein graft touchdown.  This is a 2.0 mm vessel and the tenting due to the vein graft produces a  greater than 90 degree bend.  He is brought to the laboratory for planned  stenting of the left main and attempt at angioplasty of the small PDA.   PROCEDURAL TECHNIQUE:  Informed consent was obtained.  Under 1% lidocaine  local anesthesia a 6-French sheath was placed in the right femoral artery  using the modified Seldinger technique.  Anticoagulation was initiated with  bivalirudin to achieve and maintain an ACT of greater than 225 seconds.  The  patient had been preloaded with 300 mg of Plavix 24 hours prior to the  procedure.   A 6-French [id number] guide with side holes was advanced over a wire  and engaged in the ostium of the left main.  A BMW wire was advanced into  the distal circumflex.  The left main lesion was then pre dilated using a  3.5 x 15 mm Quantum which was positioned so as to protrude into the aorta.  The lesion was then stented using a 3.5 x 13 mm Cypher deployed at 16  atmospheres.  The stent was carefully positioned so as not to jail the LAD  ostium and to protrude approximately 2 mm into the aorta.  The stent was  then post dilated using a 4.0 x 15 mm Quantum positioned to abut the distal  edge of the stent and protrude proximally beyond the end of the stent into  the aorta.  It was post dilated with this  balloon at 18 atmospheres.   Attention was then turned to the lesion of the PDA.  A 2.0 x 15 mm Maverick  balloon was advanced over the wire.  Attempts were made with the BMW wire to  cross the lesion.  However, the wire would not make the severe turn beyond  the lesion.  Wire was then removed via the balloon and a PT2 wire advanced.  This wire, too, would not make the bend.  Therefore, attempts to perform  angioplasty on this lesion were abandoned rather than risk perforation of  the vessel.  Mr. Martin Morris was transferred to the post angioplasty unit in  stable condition having tolerated the procedure well.   COMPLICATIONS:  None.   IMPRESSION/RECOMMENDATIONS:  Successful drug-eluting stent placement in the  left main reducing stenosis from 75% to 0%.  TIMI 3 flow is continued.  Will  plan on Plavix for one year.  Aspirin will be continued indefinitely.  Sheath will be removed at 11 a.m.  Bivalirudin was discontinued at the end  of the case.                                               Salvadore Farber, M.D.    WED/MEDQ  D:  09/06/2003  T:  09/06/2003  Job:  [id number]   cc:   Colon Flattery, MD  [address]  [place]  [place] [address]  Fax: [phone]

## 2011-01-11 NOTE — Op Note (Signed)
NAME:  Martin Morris, Martin Morris                       ACCOUNT NO.:  [id number]   MEDICAL RECORD NO.:  [id number]                   PATIENT TYPE:  INP   LOCATION:  2305                                 FACILITY:  [facility]   PHYSICIAN:  Kerin Perna III, M.D.           DATE OF BIRTH:  Dec 02, 1932   DATE OF PROCEDURE:  04/13/2002  DATE OF DISCHARGE:                                 OPERATIVE REPORT   PREOPERATIVE DIAGNOSES:  Class 4 angina with severe three-vessel coronary  artery disease.   POSTOPERATIVE DIAGNOSES:  Class 4 angina with severe three-vessel coronary  artery disease.   OPERATION PERFORMED:  1. Resection of malignant thymoma.  2. Coronary artery bypass grafting times four (left internal mammary artery     to left anterior descending, saphenous vein graft to diagonal, sequential     saphenous vein graft to obtuse marginal and posterior descending).   SURGEON:  Kerin Perna, M.D.   ASSISTANT:  Debby Freiberg. Thomasena Edis, P.A.   ANESTHESIA:  General by Dr. Kipp Brood.   INDICATIONS FOR PROCEDURE:  The patient is a 77 year old male with recent  onset of unstable angina.  Cardiac catheterization by Dr. Samule Ohm  demonstrated severe three-vessel coronary artery disease.  He was referred  for surgical coronary revascularization.  Prior to surgery, I reviewed the  results of the patient's cardiac catheterization with the patient and his  family and discussed the indications and expected benefits of coronary  artery bypass surgery.  I reviewed with the patient and the family, the  major aspects of the proposed operation including the choice of conduits for  grafting, location of the surgical incisions, the use of general anesthesia  and cardiopulmonary bypass, and the expected postoperative hospital  recovery.  I discussed with the patient the alternatives to surgical therapy  for treatment of his coronary artery disease.  I discussed with the patient  the risks to him of coronary artery  bypass surgery including the risks of  MI, CVA, bleeding, infection, blood transfusion requirement, and death.  He  understood these implications and agreed to proceed with the operation as  planned under what I felt was an informed consent.   OPERATIVE FINDINGS:  After performing the sternotomy, the patient was found  to have baseball-sized hard mass in the anterior superior mediastinum in the  region of the thymus gland.  It was felt to be a malignant thymoma with  extension into the pleura, the pericardium, the innominate vein and without  a capsulated margin.  The mass was dissected from the mediastinum using  sharp dissection and electrocautery.  The point of invasion of the  innominate vein was resected and a hole in the vein was closed with  pledgeted 4-0 Prolene.  The mass extended to the pleural margin, to the  pericardium, the innominate vein and close to the superior vena cava.  The  margins of resection appeared to be complete.  The  patient was then  positioned for the remainder of the heart surgery.  The specimen frozen  section returned thymic tumor or carcinoma.  Final pathology is pending.   The patient then had harvested left internal mammary artery from its origin  at the subclavian vessels.  The saphenous vein was harvested from the right  thigh using the endoscopic vein technique.  Heparin was administered and the  sternal retractor was placed.  The pericardium was opened and after the ACT  was documented as being therapeutic, the patient was cannulated and placed  on bypass through pursestring sutures placed in the ascending aorta and  right atrium.  The coronaries were then identified and the saphenous vein  and mammary artery were prepared for the distal anastomosis.  A cardioplegia  cannula was placed.  The patient was cooled to 30 degrees.  As the aortic  crossclamp was applied, 650 cc of cold blood cardioplegia was delivered to  the aortic root with immediate  cardioplegic arrest and septal temperature  dropping less than 14 degrees.  Topical iced saline slush was used to  augment myocardial preservation and a pericardial insulator pad was used to  protect the left phrenic nerve.   The distal coronary anastomoses were then performed.  The first distal  anastomosis was to the diagonal.  This was a 1.5 mm vessel with proximal 80%  stenosis.  A reversed saphenous vein was sewn end-to-side with running 7-0  Prolene with good flow through the graft.  The second and third distal  anastomoses consisted of a sequential vein graft to the obtuse marginal  continuing to the posterior descending.  The obtuse marginal was 1.7 mm  vessel with a proximal 90% stenosis.  A side-to-side anastomosis with the  vein was performed using running 7-0 Prolene.  The third distal anastomosis  was a continuation of this sequential vein to the posterior descending.  This was a 1.5 mm vessel with proximal 90% stenosis.  The end-to-side  anastomosis was sewn with running 7-0 Prolene.  There was good flow through  the graft.  Cardioplegia was redosed.  The fourth distal anastomosis was to  the distal LAD.  Proximal to the point of the anastomosis, the LAD was  deeply intramyocardial.  The mammary artery pedicle was brought through an  opening created in the left lateral pericardium and was brought down on the  LAD and sewn end-to-side with a running 8-0 Prolene.  There was excellent  flow through the anastomosis with immediate rise in septal temperature after  release of the pedicle clamp on the mammary artery.  The mammary pedicle was  secured to the epicardium and the aortic crossclamp was removed.   The heart was cardioverted back to a regular rhythm.  A partial occluding  clamp was placed on the ascending aorta and two proximal vein anastomoses  were placed using a 4.4 mm punch and running 6-0 Prolene.  The partial clamp was removed and the vein grafts were perfused.   Each had excellent flow and  hemostasis was documented to the proximal and distal sites.  The patient was  rewarmed and reperfused.  Temporary pacing wires were applied.  Then the  ventilator was resumed and the patient was ventilated and separated from  bypass without inotropes without difficulty.  Protamine was administered and  there was no adverse reaction to protamine.  The cannulas were removed.  The  mediastinum was irrigated with warm antibiotic irrigation.  The leg incision  was irrigated and  closed in a standard fashion.  After adequate hemostasis  was obtained, the pericardium was loosely reapproximated.  Two mediastinal  and a left pleural chest tube were placed and brought out through separate  incisions.  The sternum was  reapproximated with interrupted steel wire.  The pectoralis fascia was  closed with interrupted #1 Vicryl.  The subcutaneous and skin were closed  with a running Vicryl.  Sterile dressings were applied.  Total bypass time  was 110 minutes with aortic crossclamp time of 50 minutes.                                                    Mikey Bussing, M.D.    PV/MEDQ  D:  04/13/2002  T:  04/15/2002  Job:  [id number]   cc:   [facility] office   Salvadore Farber, MD [facility]  [address] [place], [place] [address]  Fax: 1

## 2011-01-11 NOTE — Discharge Summary (Signed)
NAMEADOLPHUS, HANF                       ACCOUNT NO.:  [id number]   MEDICAL RECORD NO.:  [id number]                   PATIENT TYPE:  INP   LOCATION:  6525                                 FACILITY:  [facility]   PHYSICIAN:  Salvadore Farber, M.D.             DATE OF BIRTH:  08-30-1932   DATE OF ADMISSION:  09/02/2003  DATE OF DISCHARGE:  09/07/2003                                 DISCHARGE SUMMARY   PROCEDURE:  1. Cardiac catheterization.  2. Coronary arteriogram.  3. Left ventriculogram.  4. Percutaneous transluminal coronary angioplasty and stent to one vessel.   HOSPITAL COURSE:  Mr. Bledsoe is a 77 year old male with known coronary  artery disease. He was having episodic chest soreness up to an 8/10 without  radiation. He had some nausea with this but no other associated symptoms. He  had an exercise treadmill test that was positive for ischemia and had been  scheduled for catheterization, but the catheterization had not been  performed yet. He was admitted to rule out MI and for further evaluation.   Mr. Windmiller had a cardiac catheterization on September 02, 2003 that showed 70  to 80% stenosis at the left main as well as an occlusion of the SVG to  diagonal, and the SVG to OM1 and OM2 was occluded in the portion that went  from the OM1 from the OM2. LIMA to LAD was patent with a 30 to 50% stenosis  at the distal anastomosis. The films were evaluated by Dr. Riley Kill who felt  that there was a question of redo bypass surgery versus percutaneous  intervention, and CVTS was called.   CVTS evaluated the patient but because of his history of malignant thymoma  felt that redo bypass was not the best option, and percutaneous intervention  was recommended. Additionally, they felt that a CT scan of the chest and PET  scan would be indicated to further evaluate the results of the surgery. The  PET scan results are pending. CT of the chest showed no acute disease.  Postoperative changes  are noted in the anterior mediastinum but unchanged  since prior study.   In adjusting his medications, Mr. Deike had his Altace discontinued and  had Zetia added to his medication regimen for hyperlipidemia control.  Additionally, he was started on atenolol and tolerated these well. He is to  be on Plavix for a year.   Mr. Stogsdill is considered stable for discharge on September 07, 2003 with  outpatient followup arranged.   DISCHARGE CONDITION:  Improved.   DISCHARGE DIAGNOSES:  1. Unstable anginal pain, status post percutaneous transluminal coronary     angioplasty and Cypher stent to the protective left main this admission.  2. Status post aortocoronary bypass surgery in 2003 with LIMA to LAD, SVG to     diagonal, and SVG to OM1 and PDA (SVG to diagonal as well saphenous vein     graft from the  obtuse marginal to the PDA totalled this admission).  3. Preserved left ventricular function by catheterization this admission.  4. Intolerance to Zocor and Lipitor.  5. Dyslipidemia.  6. Benign prostatic hypertrophy.  7. Malignant thymoma, status post resection and radiation therapy.  8. Family history of coronary artery disease.   DISCHARGE INSTRUCTIONS:  Activity level was to include no driving for two  days and no strenuous activity for a week.  He is to call the office with  problems with the catheterization site. He is to follow up with Dr. Dewaine Conger  as needed. He has a followup appointment with Dr. Samule Ohm on February 14 at  12:30.   DISCHARGE MEDICATIONS:  1. Aspirin 325 mg q.d.  2. Plavix 75 mg q.d.  3. Atenolol 25 mg q.d.  4. Imdur 30 mg q.d.  5. Zetia 10 mg q.d.  6. Cardura 4 mg q.d.  7. He is not to take Altace for now.  8. Multivitamin as at home.      Theodore Demark, P.A. [facility]                  Salvadore Farber, M.D.    RB/MEDQ  D:  09/07/2003  T:  09/07/2003  Job:  [id number]   cc:   Colon Flattery, MD  [address]  [place]  [place] [address]  Fax: [phone]

## 2011-01-11 NOTE — Assessment & Plan Note (Signed)
[facility]                          CHRONIC HEART FAILURE NOTE   Martin Morris, Martin Morris                    MRN:          [id number]  DATE:10/30/2006                            DOB:          Sep 19, 1932    Martin Morris is a pleasant 77 year old gentleman seen in the office today  for further evaluation and medication titration associated with his  hyperlipidemia in the setting of documented coronary disease.  Mr.  Morris states that he has been compliant with his Crestor 5 mg therapy  every other day.  He has been tolerating it well.  He has had some leg  aches and pains in the legs and feet with some cramping, primarily in  the left leg, and continues to have limited exercise due to foot and leg  tingling.  His diet has not been significantly changed.   PAST MEDICAL HISTORY:  Pertinent for documented coronary disease, prior  inferior wall myocardial infarction, EF 47%, hypercholesterolemia,  history of thymoma monitored by Dr. Donata Clay and hypertension.   CURRENT MEDICATIONS:  1. Enteric-coated aspirin 325 mg daily.  2. Imdur 30 mg daily.  3. Plavix 75 mg daily.  4. Atenolol 25 mg daily.  5. Lisinopril 5 mg daily.  6. Crestor 5 mg every other day.  7. Flomax 0.4 mg daily.  8. Avodart 0.5 mg daily.  9. It is important to note that, after the patient had left, I      received a call back from his wife that he is taking topical      testosterone in the form of AndroGel at this time as well.  The      patient was asked if he was taking herbals or other over-the-      counter medications, which he denied.   DRUG ALLERGIES:  None are known.   REVIEW OF SYSTEMS:  As stated in the HPI.  Otherwise, negative.   PHYSICAL EXAM:  Weight is 211 pounds, down 7 pounds.  Blood pressure  154/86, on recheck 164/86, recheck 160/100.  Heart rate 68-78.   Labs on October 23, 2006 reveal total cholesterol 135, triglyceride 87,  HDL 30.9, LDL 87.  LFTs reveal  AST of 119, ALT 106, alkaline phosphatase  of 125.  The patient did start Avodart during this timeframe.   ASSESSMENT:  The patient has hyper-transaminase and is seen in the lipid  clinic today.  We will discontinue his Crestor for now, recheck the  patient's labs to verify that these are normal or are actually elevated.  On recheck the AST and ALT are indeed higher, and so this therapy will  remain discontinued.  I spoke with Mrs. Culbreath by telephone at 10:00 on  October 31, 2006 to  continue this hold.  The patient will return in 1 week to have LFTs  redrawn.  In the meantime I will verify if any other drug therapies may  need to be evaluated for discontinuation possibility.      Shelby Dubin, PharmD, BCPS, CPP  Electronically Signed      [name], MD, [facility]  Electronically Signed  MP/MedQ  DD: 10/31/2006  DT: 10/31/2006  Job #: [id number]   cc:   Salvadore Farber, MD  Colon Flattery, D.O.

## 2011-03-05 ENCOUNTER — Other Ambulatory Visit (HOSPITAL_COMMUNITY): Payer: Self-pay | Admitting: Oncology

## 2011-03-05 ENCOUNTER — Encounter (HOSPITAL_BASED_OUTPATIENT_CLINIC_OR_DEPARTMENT_OTHER): Payer: Medicare Other | Admitting: Oncology

## 2011-03-05 DIAGNOSIS — C8319 Mantle cell lymphoma, extranodal and solid organ sites: Secondary | ICD-10-CM

## 2011-03-05 LAB — CBC WITH DIFFERENTIAL/PLATELET
Eosinophils Absolute: 0.4 10*3/uL (ref 0.0–0.5)
MONO#: 0.1 10*3/uL (ref 0.1–0.9)
NEUT#: 3.9 10*3/uL (ref 1.5–6.5)
Platelets: 189 10*3/uL (ref 140–400)
RBC: 4.26 10*6/uL (ref 4.20–5.82)
RDW: 13.9 % (ref 11.0–14.6)
WBC: 5.4 10*3/uL (ref 4.0–10.3)
lymph#: 1 10*3/uL (ref 0.9–3.3)

## 2011-03-05 LAB — COMPREHENSIVE METABOLIC PANEL
ALT: 13 U/L (ref 0–53)
AST: 23 U/L (ref 0–37)
Alkaline Phosphatase: 78 U/L (ref 39–117)
Sodium: 140 mEq/L (ref 135–145)
Total Bilirubin: 0.7 mg/dL (ref 0.3–1.2)
Total Protein: 6.5 g/dL (ref 6.0–8.3)

## 2011-05-03 ENCOUNTER — Other Ambulatory Visit: Payer: Self-pay | Admitting: Cardiology

## 2011-05-06 ENCOUNTER — Encounter (HOSPITAL_BASED_OUTPATIENT_CLINIC_OR_DEPARTMENT_OTHER): Payer: Medicare Other | Admitting: Oncology

## 2011-05-06 ENCOUNTER — Other Ambulatory Visit (HOSPITAL_COMMUNITY): Payer: Self-pay | Admitting: Oncology

## 2011-05-06 DIAGNOSIS — C8319 Mantle cell lymphoma, extranodal and solid organ sites: Secondary | ICD-10-CM

## 2011-05-06 DIAGNOSIS — C8590 Non-Hodgkin lymphoma, unspecified, unspecified site: Secondary | ICD-10-CM

## 2011-05-06 LAB — CBC WITH DIFFERENTIAL/PLATELET
BASO%: 0.4 % (ref 0.0–2.0)
Basophils Absolute: 0 10*3/uL (ref 0.0–0.1)
EOS%: 6.6 % (ref 0.0–7.0)
HGB: 13.2 g/dL (ref 13.0–17.1)
MCH: 32.5 pg (ref 27.2–33.4)
MCHC: 34.2 g/dL (ref 32.0–36.0)
MONO%: 10 % (ref 0.0–14.0)
RBC: 4.07 10*6/uL — ABNORMAL LOW (ref 4.20–5.82)
RDW: 13.5 % (ref 11.0–14.6)
lymph#: 0.9 10*3/uL (ref 0.9–3.3)

## 2011-05-06 LAB — COMPREHENSIVE METABOLIC PANEL
AST: 15 U/L (ref 0–37)
Albumin: 3.9 g/dL (ref 3.5–5.2)
Alkaline Phosphatase: 69 U/L (ref 39–117)
BUN: 17 mg/dL (ref 6–23)
Glucose, Bld: 146 mg/dL — ABNORMAL HIGH (ref 70–99)
Potassium: 3.8 mEq/L (ref 3.5–5.3)
Sodium: 142 mEq/L (ref 135–145)
Total Bilirubin: 0.7 mg/dL (ref 0.3–1.2)

## 2011-05-14 ENCOUNTER — Telehealth: Payer: Self-pay | Admitting: Cardiology

## 2011-05-14 NOTE — Telephone Encounter (Signed)
Pt having swelling and redness in legs for 2 weeks

## 2011-05-14 NOTE — Telephone Encounter (Signed)
Spoke with patient who states he has had swelling in his right foot and ankle with soreness and redness for about 2 weeks.  He saw Dr Lysbeth Galas who gave him an anti-biotic and antiinflammatory medication.  Pt states it is a little better.  Dr Lysbeth Galas also sent him to see if he had a blood clot in that leg (which he reports he did not).  Pt has not been wearing compression stockings as ordered at last office visit because he says they are too hard to get used to.  He will call Dr Joyce Copa office back to discuss further treatment and follow up

## 2011-05-28 LAB — GLUCOSE, CAPILLARY: Glucose-Capillary: 107 — ABNORMAL HIGH

## 2011-06-05 ENCOUNTER — Encounter: Payer: Self-pay | Admitting: Medical Oncology

## 2011-06-05 LAB — DIFFERENTIAL
Eosinophils Absolute: 0.2
Eosinophils Relative: 5
Lymphs Abs: 1.2
Monocytes Relative: 14 — ABNORMAL HIGH

## 2011-06-05 LAB — CBC
HCT: 38.6 — ABNORMAL LOW
Hemoglobin: 13
MCV: 92.8
RBC: 4.15 — ABNORMAL LOW
WBC: 3.7 — ABNORMAL LOW

## 2011-06-28 ENCOUNTER — Other Ambulatory Visit (HOSPITAL_COMMUNITY): Payer: Self-pay | Admitting: Oncology

## 2011-06-28 ENCOUNTER — Encounter (HOSPITAL_BASED_OUTPATIENT_CLINIC_OR_DEPARTMENT_OTHER): Payer: Medicare Other | Admitting: Oncology

## 2011-06-28 DIAGNOSIS — C8319 Mantle cell lymphoma, extranodal and solid organ sites: Secondary | ICD-10-CM

## 2011-06-28 LAB — CBC WITH DIFFERENTIAL/PLATELET
BASO%: 0.4 % (ref 0.0–2.0)
Basophils Absolute: 0 10*3/uL (ref 0.0–0.1)
HCT: 40.7 % (ref 38.4–49.9)
HGB: 13.7 g/dL (ref 13.0–17.1)
LYMPH%: 25.4 % (ref 14.0–49.0)
MCH: 32.5 pg (ref 27.2–33.4)
MCHC: 33.7 g/dL (ref 32.0–36.0)
MONO#: 0.6 10*3/uL (ref 0.1–0.9)
NEUT%: 61.2 % (ref 39.0–75.0)
Platelets: 186 10*3/uL (ref 140–400)

## 2011-06-28 LAB — COMPREHENSIVE METABOLIC PANEL
AST: 18 U/L (ref 0–37)
BUN: 14 mg/dL (ref 6–23)
Calcium: 9.4 mg/dL (ref 8.4–10.5)
Chloride: 104 mEq/L (ref 96–112)
Creatinine, Ser: 1.1 mg/dL (ref 0.50–1.35)
Glucose, Bld: 103 mg/dL — ABNORMAL HIGH (ref 70–99)

## 2011-07-01 ENCOUNTER — Ambulatory Visit (HOSPITAL_COMMUNITY)
Admission: RE | Admit: 2011-07-01 | Discharge: 2011-07-01 | Disposition: A | Discharge status: Home / Self Care | Payer: Medicare Other | Source: Ambulatory Visit | Attending: Oncology | Admitting: Oncology

## 2011-07-01 DIAGNOSIS — C8589 Other specified types of non-Hodgkin lymphoma, extranodal and solid organ sites: Secondary | ICD-10-CM | POA: Insufficient documentation

## 2011-07-01 DIAGNOSIS — I517 Cardiomegaly: Secondary | ICD-10-CM | POA: Insufficient documentation

## 2011-07-01 DIAGNOSIS — K4020 Bilateral inguinal hernia, without obstruction or gangrene, not specified as recurrent: Secondary | ICD-10-CM | POA: Insufficient documentation

## 2011-07-01 DIAGNOSIS — Z951 Presence of aortocoronary bypass graft: Secondary | ICD-10-CM | POA: Insufficient documentation

## 2011-07-01 DIAGNOSIS — C8590 Non-Hodgkin lymphoma, unspecified, unspecified site: Secondary | ICD-10-CM

## 2011-07-01 DIAGNOSIS — R599 Enlarged lymph nodes, unspecified: Secondary | ICD-10-CM | POA: Insufficient documentation

## 2011-07-01 MED ORDER — IOHEXOL 300 MG/ML  SOLN
100.0000 mL | Freq: Once | INTRAMUSCULAR | Status: AC | PRN
  Administered 2011-07-01: 100 mL via INTRAVENOUS

## 2011-07-05 ENCOUNTER — Ambulatory Visit (HOSPITAL_BASED_OUTPATIENT_CLINIC_OR_DEPARTMENT_OTHER): Payer: Medicare Other | Admitting: Oncology

## 2011-07-05 ENCOUNTER — Telehealth: Payer: Self-pay | Admitting: Oncology

## 2011-07-05 VITALS — BP 123/69 | HR 72 | Temp 97.6°F | Ht 62.5 in | Wt 201.9 lb

## 2011-07-05 DIAGNOSIS — C8313 Mantle cell lymphoma, intra-abdominal lymph nodes: Secondary | ICD-10-CM

## 2011-07-05 NOTE — Progress Notes (Signed)
CC:   Martin Morris. Martin Dice, MD,FACG Martin Morris, M.D.  HISTORY:  I saw Martin Morris today for followup of his mantle cell lymphoma with diagnosis going back to April 2008 when the patient presented with a large abdominal mass.  Martin Morris had stage IV disease with a positive bone marrow at the time of diagnosis.  He was treated with 8 cycles of Rituxan, Cytoxan, vincristine and Decadron in combination with Neulasta from 12/19/2006 through 05/15/2007.  The patient then received maintenance Rituxan from July 16, 2007 through February 01, 2010.  While on Rituxan, he had recurrence of his disease by CT scan.  CT scan was carried out on 01/24/2010.  Martin Morris then received treatment with bendamustine and Rituxan also in combination with Neulasta for 6 cycles from 02/19/2010 through 07/26/2010.  He has done fairly well with these treatments and with his disease.  The patient was felt to be in complete remission through his last CT scan of the chest, abdomen and pelvis carried out on 01/01/2011.  He had a PET scan on 08/22/2010 that showed no evidence for hypermetabolic lymphoma.  Martin Morris is here with his wife, Martin Morris.  He had a CT scan of abdomen and pelvis carried out on 07/01/2011.  I am seeing the report for the 1st time.  Unfortunately, there is evidence for recurrence, specifically interval development of peritoneal disease with soft tissue nodularity along the omentum.  For example, within the mid abdomen there is an omental nodule measuring 1.5 x 1.9 cm on image 52.  Slightly more cranial, there was an omental nodule measuring 1.1 x 1.7 cm.  In addition, the patient has a new right external iliac lymph node, pathologically enlarged, measuring 1.2 x 1.8 cm on image 66.  Chest x- ray showed stable cardiomegaly.  Martin Morris does have a history of coronary artery disease and at 1 time a left ventricular ejection fraction of 47%.  Clinically Martin Morris denies any change in his  condition.  He was last seen by [place] on 05/06/2011.  He denies any pain, sense of ill health, fever, chills, night sweats, any loss of energy.  PHYSICAL EXAMINATION:  There is little change.  Weight is 202 pounds. Height is recorded at 62-1/2 inches although in the past the patient measured 66-1/2 inches.  Body surface area is 2.0 sq. m.  Blood pressure 123/69.  Other vital signs are normal.  There is no scleral icterus. Mouth and pharynx benign.  No peripheral adenopathy palpable.  Heart and lungs are normal.  Rhythm is regular.  I did not hear a systolic ejection murmur today.  The patient does not have a central catheter or port.  No axillary or inguinal adenopathy.  Abdomen:  Obese, nontender, with no organomegaly or masses palpable.  Extremities continue to be edematous as previously described, 2+ to 3+.  No axillary or inguinal adenopathy.  LABORATORY DATA:  From November 2nd, white count was 6.6, ANC 4.0, hemoglobin 13.7, hematocrit 40.7 and platelets were 186,000. Chemistries were normal except for a glucose of 103.  BUN 14, creatinine 1.1, albumin 4.1.  LDH was 152.  IMPRESSION AND PLAN:  Unfortunately, it looks like Martin Morris' disease has recurred.  I am going to have to review the CT scan.  Today's session was rather lengthy.  I reviewed the scans with the patient and his wife.  I gave them copies of the report.  I plan to review the CT scans with the radiologist.  We will be  needing to set up some chemotherapy for Martin Morris.  It has been a year since he received chemotherapy.  We may want to try bendamustine and Rituxan again.  He tolerated this quite well in the past, it.  We may need to keep him on some sort of maintenance program with treatment.  I may want to consult with Dr. Elta Guadeloupe from [facility].  Again, today's visit was rather lengthy, lasting approximately 40 minutes, most of which was spent in discussion.  We will plan to see Martin Morris again in 4 weeks,  somewhere around December 6, at which time we will check CBC, chemistries, including an LDH and a uric acid.    ______________________________ Samul Dada, M.D. DSM/MEDQ  D:  07/05/2011  T:  07/05/2011  Job:  [id number]    I did review the scans on 07/11/11.  The patient has small volume disease.  We may be able to delay therapy but will have to rescan the patient soon.  DSM

## 2011-07-05 NOTE — Progress Notes (Signed)
This office note has been dictated.   #[id number]

## 2011-07-05 NOTE — Telephone Encounter (Signed)
gv pt appt for 12/7

## 2011-07-30 ENCOUNTER — Other Ambulatory Visit: Payer: Self-pay | Admitting: Cardiovascular Disease

## 2011-07-30 ENCOUNTER — Other Ambulatory Visit: Payer: Self-pay | Admitting: Cardiology

## 2011-08-02 ENCOUNTER — Other Ambulatory Visit (HOSPITAL_COMMUNITY): Payer: Self-pay | Admitting: Oncology

## 2011-08-02 ENCOUNTER — Other Ambulatory Visit (HOSPITAL_BASED_OUTPATIENT_CLINIC_OR_DEPARTMENT_OTHER): Payer: Medicare Other | Admitting: Lab

## 2011-08-02 ENCOUNTER — Ambulatory Visit (HOSPITAL_BASED_OUTPATIENT_CLINIC_OR_DEPARTMENT_OTHER): Payer: Medicare Other | Admitting: Oncology

## 2011-08-02 VITALS — BP 135/73 | HR 64 | Temp 97.5°F | Ht 62.5 in | Wt 200.4 lb

## 2011-08-02 DIAGNOSIS — I2510 Atherosclerotic heart disease of native coronary artery without angina pectoris: Secondary | ICD-10-CM

## 2011-08-02 DIAGNOSIS — C8313 Mantle cell lymphoma, intra-abdominal lymph nodes: Secondary | ICD-10-CM

## 2011-08-02 DIAGNOSIS — I10 Essential (primary) hypertension: Secondary | ICD-10-CM

## 2011-08-02 DIAGNOSIS — I739 Peripheral vascular disease, unspecified: Secondary | ICD-10-CM

## 2011-08-02 DIAGNOSIS — C8583 Other specified types of non-Hodgkin lymphoma, intra-abdominal lymph nodes: Secondary | ICD-10-CM

## 2011-08-02 DIAGNOSIS — C8319 Mantle cell lymphoma, extranodal and solid organ sites: Secondary | ICD-10-CM

## 2011-08-02 LAB — CBC WITH DIFFERENTIAL/PLATELET
Basophils Absolute: 0 10*3/uL (ref 0.0–0.1)
Eosinophils Absolute: 0.3 10*3/uL (ref 0.0–0.5)
HGB: 13.7 g/dL (ref 13.0–17.1)
MCV: 97.5 fL (ref 79.3–98.0)
MONO#: 0.5 10*3/uL (ref 0.1–0.9)
NEUT#: 4.5 10*3/uL (ref 1.5–6.5)
RBC: 4.2 10*6/uL (ref 4.20–5.82)
RDW: 13.8 % (ref 11.0–14.6)
WBC: 7 10*3/uL (ref 4.0–10.3)
lymph#: 1.7 10*3/uL (ref 0.9–3.3)

## 2011-08-02 LAB — COMPREHENSIVE METABOLIC PANEL
Albumin: 4.1 g/dL (ref 3.5–5.2)
BUN: 25 mg/dL — ABNORMAL HIGH (ref 6–23)
CO2: 26 mEq/L (ref 19–32)
Calcium: 9.6 mg/dL (ref 8.4–10.5)
Chloride: 105 mEq/L (ref 96–112)
Glucose, Bld: 100 mg/dL — ABNORMAL HIGH (ref 70–99)
Potassium: 4 mEq/L (ref 3.5–5.3)
Sodium: 140 mEq/L (ref 135–145)
Total Protein: 7 g/dL (ref 6.0–8.3)

## 2011-08-02 LAB — URIC ACID: Uric Acid, Serum: 7.3 mg/dL (ref 4.0–7.8)

## 2011-08-02 NOTE — Progress Notes (Signed)
This office note has been dictated.  #[id number]

## 2011-08-02 NOTE — Progress Notes (Signed)
CC:   Martin Morris, M.D.  HISTORY:  Martin Morris was seen today for followup of his mantle cell lymphoma with diagnosis going back to April 2008 when the patient presented with a large abdominal mass and stage IV disease on the basis of a positive bone marrow.  It will be recalled that the patient had some evidence of biliary obstruction, had ERCP with a non metal stent placement on 11/18/2006.  The stent was removed on 06/16/2007.  Martin Morris is here today with his wife Martin Morris.  He was last seen by [place] on 07/05/2011.  There has been no change in his condition.  Unfortunately the CT scan of abdomen and pelvis carried out on 07/01/2011 shows early recurrence of his disease with a new enlarged lymph node along the external iliac lymph node chain and some soft tissue nodularity along the omentum.  The patient denies any changes in his condition.  He continues to have chronic low back pain.  No fever, chills, night sweats, any difficulty eating or abdominal discomfort.  PROBLEM LIST:  Reads as follows. 1. Mantle cell lymphoma diagnosed in March 2008 with positive bone     marrow initially on 12/03/2006 and then negative bone marrow after     treatment in October 2009.  As stated, the patient presented with     obstructive liver abnormalities requiring ERCP and non metal stent     placement by Dr. Melvia Heaps.  The stent was subsequently removed     in October 2008.  The patient was treated with 8 cycles of Rituxan,     Cytoxan, vincristine and Decadron in combination with Neulasta from     12/19/2006 through 05/15/2007.  The patient then received     maintenance Rituxan from July 16, 2007 through February 01, 2010.     While on Rituxan, he had a recurrence of his disease by CT scan     carried out on 01/24/2010.  Martin Morris then received treatment     with bendamustine and Rituxan in combination with Neulasta for 6     cycles from 02/19/2010 through 07/26/2010.  He  has been off     treatment since that time and has been doing well without any     symptoms until the CT scan of the abdomen and pelvis on 07/01/2011     showed signs of recurrence.  We have prior CT scans of chest,     abdomen, and pelvis from 01/01/2011.  Also a PET scan from     08/22/2010 that showed no evidence of disease. 2. Coronary artery disease. 3. Peripheral vascular disease. 4. Dyslipidemia. 5. Hypertension. 6. History of depression. 7. History of kidney stones. 8. History of sleep apnea. 9. History of colonic polyps. 10.History of cancer of the thymus gland status thymectomy on     04/13/2002, status post radiation treatments.  MEDICINES:  Reviewed and recorded.  These include Norvasc, aspirin, atenolol, Celexa, Lasix, Imdur, Nitrostat as needed, Plavix, K-Dur, Pravachol.  PHYSICAL EXAMINATION:  There is little change.  Weight is 200 pounds 6.4 ounces, height 5 feet 2-1/2 inches, body surface area 2.0 m squared. Blood pressure 35/73  in the right arm.  Other vital signs are normal. There is no scleral icterus.  Mouth and pharynx are benign.  There is no peripheral adenopathy palpable in the neck, supraclavicular, axillary or inguinal areas.  Heart/lungs:  Normal.  Rhythm was regular.  I did not hear a systolic ejection murmur today.  The patient does not have a central catheter or port.  Abdomen:  Obese, nontender with no organomegaly or masses palpable.  Extremities:  Continue to show edema 2+ in the lower legs and ankles.  LABORATORY DATA:  Today white count 7.0, ANC 4.5, hemoglobin 13.7, hematocrit 40.9 and platelets 168,000.  Chemistries today are pending including uric acid.  Chemistries from 06/28/2011 notable for BUN of 14, creatinine 1.10, otherwise normal.  Albumin was 4.1, LDH 152.  As stated, the patient's last CT scan of abdomen and pelvis was on 07/01/2011.  He also had a chest x-ray on 07/01/2011 that showed stable cardiomegaly with no active lung  disease, status post CABG.  The CT scan of abdomen and pelvis with IV and oral contrast on 07/01/2011 showed interval development of peritoneal disease and new right external iliac lymph node that is pathologically enlarged all very worrisome for recurrence of this patient's lymphoma.  There is some stable appearance of mild soft tissue stranding surrounding the celiac trunk and SMA.  IMPRESSION AND PLAN:  Unfortunately it appears that the patient's disease has recurred.  I have reviewed the CT scans with the radiologist.  My plan was to try to get the patient through the holiday season.  He is currently asymptomatic.  My plan is to repeat the chest x- ray, CT scan of abdomen and pelvis with oral and IV contrast somewhere around January 7th and plan to see Martin Morris again around January 11th at which time we will check CBC and chemistries along with LDH and uric acid.  If possible it will be nice to try to delay reinitiating treatment.  At the point where treatment be necessary again we can either consider retreating the patient with bendamustine and Rituxan or perhaps consider other options.  As stated, the patient has received Cytoxan, vincristine and Rituxan in the past.  I believe we did not treat him with Adriamycin because of concerns about cardiac function. This may need to be readdressed.  At some point, we may want to also check a PET scan.  We may also want to try to consider referring Martin Morris to one of our in neighboring institutions for a 2nd opinion regarding chemotherapy at this time.    ______________________________ Martin Morris, M.D. DSM/MEDQ  D:  08/02/2011  T:  08/02/2011  Job:  [id number]

## 2011-08-03 ENCOUNTER — Other Ambulatory Visit: Payer: Self-pay | Admitting: Cardiovascular Disease

## 2011-08-16 ENCOUNTER — Ambulatory Visit: Payer: Medicare Other | Admitting: Cardiology

## 2011-08-29 ENCOUNTER — Encounter: Payer: Self-pay | Admitting: *Deleted

## 2011-09-02 ENCOUNTER — Other Ambulatory Visit (HOSPITAL_COMMUNITY): Payer: Self-pay | Admitting: Oncology

## 2011-09-02 ENCOUNTER — Ambulatory Visit (HOSPITAL_COMMUNITY)
Admission: RE | Admit: 2011-09-02 | Discharge: 2011-09-02 | Disposition: A | Discharge status: Home / Self Care | Payer: Medicare Other | Source: Ambulatory Visit | Attending: Oncology | Admitting: Oncology

## 2011-09-02 ENCOUNTER — Other Ambulatory Visit (HOSPITAL_BASED_OUTPATIENT_CLINIC_OR_DEPARTMENT_OTHER): Payer: Medicare Other | Admitting: Lab

## 2011-09-02 DIAGNOSIS — Z951 Presence of aortocoronary bypass graft: Secondary | ICD-10-CM | POA: Insufficient documentation

## 2011-09-02 DIAGNOSIS — R599 Enlarged lymph nodes, unspecified: Secondary | ICD-10-CM | POA: Insufficient documentation

## 2011-09-02 DIAGNOSIS — C8313 Mantle cell lymphoma, intra-abdominal lymph nodes: Secondary | ICD-10-CM

## 2011-09-02 DIAGNOSIS — N281 Cyst of kidney, acquired: Secondary | ICD-10-CM | POA: Insufficient documentation

## 2011-09-02 DIAGNOSIS — C8589 Other specified types of non-Hodgkin lymphoma, extranodal and solid organ sites: Secondary | ICD-10-CM | POA: Insufficient documentation

## 2011-09-02 LAB — CBC WITH DIFFERENTIAL/PLATELET
BASO%: 0.1 % (ref 0.0–2.0)
Eosinophils Absolute: 0.4 10*3/uL (ref 0.0–0.5)
LYMPH%: 21.2 % (ref 14.0–49.0)
MCHC: 33.8 g/dL (ref 32.0–36.0)
MONO#: 0.7 10*3/uL (ref 0.1–0.9)
MONO%: 9 % (ref 0.0–14.0)
NEUT#: 4.6 10*3/uL (ref 1.5–6.5)
Platelets: 186 10*3/uL (ref 140–400)
RBC: 4.13 10*6/uL — ABNORMAL LOW (ref 4.20–5.82)
RDW: 13.5 % (ref 11.0–14.6)
WBC: 7.3 10*3/uL (ref 4.0–10.3)

## 2011-09-02 LAB — CMP (CANCER CENTER ONLY)
ALT(SGPT): 17 U/L (ref 10–47)
AST: 21 U/L (ref 11–38)
Albumin: 3.4 g/dL (ref 3.3–5.5)
CO2: 29 mEq/L (ref 18–33)
Calcium: 8.5 mg/dL (ref 8.0–10.3)
Chloride: 98 mEq/L (ref 98–108)
Creat: 0.8 mg/dl (ref 0.6–1.2)
Potassium: 4.1 mEq/L (ref 3.3–4.7)

## 2011-09-02 LAB — URIC ACID: Uric Acid, Serum: 7.1 mg/dL (ref 4.0–7.8)

## 2011-09-02 LAB — LACTATE DEHYDROGENASE: LDH: 152 U/L (ref 94–250)

## 2011-09-02 MED ORDER — IOHEXOL 300 MG/ML  SOLN
100.0000 mL | Freq: Once | INTRAMUSCULAR | Status: AC | PRN
  Administered 2011-09-02: 100 mL via INTRAVENOUS

## 2011-09-06 ENCOUNTER — Ambulatory Visit (HOSPITAL_BASED_OUTPATIENT_CLINIC_OR_DEPARTMENT_OTHER): Payer: Medicare Other | Admitting: Oncology

## 2011-09-06 ENCOUNTER — Encounter: Payer: Self-pay | Admitting: Oncology

## 2011-09-06 VITALS — BP 151/83 | HR 57 | Temp 98.5°F | Ht 62.5 in | Wt 202.2 lb

## 2011-09-06 DIAGNOSIS — R5383 Other fatigue: Secondary | ICD-10-CM

## 2011-09-06 DIAGNOSIS — C8313 Mantle cell lymphoma, intra-abdominal lymph nodes: Secondary | ICD-10-CM

## 2011-09-06 DIAGNOSIS — Z23 Encounter for immunization: Secondary | ICD-10-CM

## 2011-09-06 MED ORDER — PNEUMOCOCCAL VAC POLYVALENT 25 MCG/0.5ML IJ INJ
0.5000 mL | INJECTION | Freq: Once | INTRAMUSCULAR | Status: AC
  Administered 2011-09-06: 0.5 mL via INTRAMUSCULAR
  Filled 2011-09-06: qty 0.5

## 2011-09-06 NOTE — Progress Notes (Signed)
This office note has been dictated.  #[id number]

## 2011-09-07 NOTE — Progress Notes (Signed)
CC:   Martin Morris, M.D.  HISTORY:  I saw Martin Morris today for followup of his mantle cell lymphoma with diagnosis going back to April 2008.  The patient has been off of all chemotherapy since December 2011.  Unfortunately, the patient has recurred.  This was picked up on a CT scan of the abdomen pelvis on 07/01/2011.  The patient has remained asymptomatic, and we elected to simply observe him and to repeat the CT scans of the abdomen and pelvis, as well as a chest x-ray carried out on 09/02/2011.  Those most recent imaging studies were compared to the study of 07/01/2011.  I have reviewed the CT scans with a radiologist.  There is a left posterior chest wall lesion measuring 2.3 x 5.9 cm which has progressed.  There is a lymph node located in the region of the right anterior juxta-diaphragm which has progressed now measuring 4.9 x 2.1 cm on image 11.  Liver- spleen, duodenum, pancreas, gallbladder, and adrenal glands are unremarkable.  There is gastrohepatic lymphadenopathy that has progressed with a 1.9 x 1.2-cm lymph node that previously measured 1.0 x 0.6 cm.  The celiac axis lymphadenopathy measuring 2.2 x 1.5 cm as compared with 1.2 x 0.7 cm previously.  Omental disease has worsened now measuring 4.1 x 2.4 cm whereas previously it measured 1.1 x 1.7 cm. There is another nodule which now measures 2.2 x 3.0 cm and previously measured 1.1 x 1.5 cm.  There is right pelvic sidewall lymphadenopathy now measuring 1.7 x 2.4 cm, and a lymph node in the right inguinal region measuring 2.2 x 1.7 cm.  In short, there has been interval progression of the patient's disease compared with the CT scan of 07/01/2011.  The chest x-ray from 09/01/2010 shows stability without any significant findings.  The patient is here today with his wife, Martin Morris.  The patient complains of increasing fatigue and no energy.  Apparently, he lost his balance a couple weeks ago and fell down the cellar stairs,  and was very fortunate not to hurt himself seriously.  PROBLEM LIST:  1. Mantle cell lymphoma diagnosed in March 2008 with positive bone  marrow initially on 12/03/2006 and then negative bone marrow after  treatment in October 2009. As stated, the patient presented with  obstructive liver abnormalities requiring ERCP and non metal stent  placement by Dr. Melvia Heaps. The stent was subsequently removed  in October 2008. The patient was treated with 8 cycles of Rituxan,  Cytoxan, vincristine and Decadron in combination with Neulasta from  12/19/2006 through 05/15/2007. The patient then received  maintenance Rituxan from July 16, 2007 through February 01, 2010.  While on Rituxan, he had a recurrence of his disease by CT scan  carried out on 01/24/2010. Martin Morris then received treatment  with bendamustine and Rituxan in combination with Neulasta for 6  cycles from 02/19/2010 through 07/26/2010. We have a prior PET scan from  08/22/2010 and CT scans of chest, abdomen, and pelvis from 01/01/2011 that showed no evidence of disease. He has been off treatment since December 2011 and had been doing well without any symptoms until the CT scan of the abdomen and pelvis on 07/01/2011 showed signs of recurrence. Most recent CT scans of abdomen and pelvis with IVC on 09/02/11 show further progression.   2. Coronary artery disease, S/P 2 MIs, most recently 03/21/10.  Had heart cath on 03/22/10. LVEF is 40-50%. 3. Peripheral vascular disease.  4. Dyslipidemia.  5. Hypertension.  6.  History of depression.  7. History of kidney stones.  8. History of sleep apnea.  9. History of colonic polyps.  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 Kathlee Nations Trigt, status post radiation treatments.   MEDICATIONS: 1. Norvasc 2.5 mg daily. 2. Aspirin 325 mg daily. 3. Atenolol 50 mg daily. 4. Celexa 20 mg daily. 5. Lasix 20 mg as needed. 6. Imdur 60 mg daily. 7. Plavix 75 mg  daily. 8. K-Dur 20 mEq twice a day. 9. Pravachol 80 mg at bedtime. 10.Nitrostat 0.4 mg sublingually.  The patient has not needed to use     this.  PHYSICAL EXAM:  There is little change.  Vital signs: Weight today is 202 pounds, height 5 feet 2-1/2 inches, body surface area 2.01 m sq. Blood pressure 151/83.  Other vital signs are normal. O2 saturation was 94%. There is no scleral icterus. Mouth and pharynx are benign. There is no  peripheral adenopathy palpable in the neck, supraclavicular, axillary or  inguinal areas. Heart/lungs: Normal. Rhythm was regular. I did not  hear a systolic ejection murmur today. The patient does not have a  central catheter or port. Abdomen: Obese, nontender with no  organomegaly or masses palpable. Extremities: Continue to show edema  2+ in the lower legs and ankles.  The edema in the right leg is slightly greater than that on the left leg.     LABORATORY DATA:  From 09/02/2011: White count 7.3, ANC 4.6, hemoglobin 13.5, hematocrit 40.0, and platelets 186,000.  Chemistries were normal. BUN 17, creatinine 0.8, and albumin 3.4.  LDH 152.  Uric acid was 7.1.  IMPRESSION AND PLAN:  Clinically, Mr.  Morris shows little change.  He complains of having less energy.  So far, his lymphoma is progressing over the past 2 months.  He is going to need treatment soon, but we do have some time, hopefully, to arrive at the best treatment program for him.  Because of the patient's age and cardiac issues, also somewhat tenuous clinical status, I would like to obtain a 2nd opinion from Dr. Elta Guadeloupe at [facility].  It should be noted that the patient did tolerate the treatment with bendamustine, Rituxan, and Neulasta on day 1, 2, and 3 program quite well.  The situation has been discussed with the patient and his wife.  They are in agreement for a 2nd opinion.  We will try to arrange this in the next couple of weeks.  I have asked Martin Morris to return in 3 weeks at which  time we will check CBC, chemistries.  The patient did obtain a flu shot in early December.  We are going to give him Pneumovax today, since he does not think he has ever had a pneumonia shot.  The patient and his wife understand that if there is any clinical change that they are to call us without delay.    ______________________________ Samul Dada, M.D. DSM/MEDQ  D:  09/06/2011  T:  09/07/2011  Job:  [id number]

## 2011-09-09 ENCOUNTER — Telehealth: Payer: Self-pay | Admitting: Oncology

## 2011-09-09 ENCOUNTER — Encounter: Payer: Self-pay | Admitting: Cardiology

## 2011-09-09 NOTE — Telephone Encounter (Signed)
S/w wife today re appt for 1/28. Per wife pt as to have referral to [facility] b4 seeing DM again. S/w tiffany re referral to [facility] and that appt needed b4 1/28 f/u. Wife aware and the HIM will contact her re [facility] appt.

## 2011-09-10 ENCOUNTER — Telehealth: Payer: Self-pay | Admitting: Oncology

## 2011-09-10 NOTE — Telephone Encounter (Signed)
per dr dm move pt appt from 1/29 to 2/6 to follow [facility] appt,[place]  aom

## 2011-09-11 ENCOUNTER — Telehealth: Payer: Self-pay | Admitting: Oncology

## 2011-09-11 ENCOUNTER — Encounter: Payer: Self-pay | Admitting: Cardiology

## 2011-09-11 ENCOUNTER — Ambulatory Visit (INDEPENDENT_AMBULATORY_CARE_PROVIDER_SITE_OTHER): Payer: Medicare Other | Admitting: Cardiology

## 2011-09-11 DIAGNOSIS — I2510 Atherosclerotic heart disease of native coronary artery without angina pectoris: Secondary | ICD-10-CM

## 2011-09-11 DIAGNOSIS — E785 Hyperlipidemia, unspecified: Secondary | ICD-10-CM

## 2011-09-11 DIAGNOSIS — I10 Essential (primary) hypertension: Secondary | ICD-10-CM

## 2011-09-11 NOTE — Telephone Encounter (Signed)
Pt appt. With  Dr. Elta Guadeloupe @ [facility] is 09/24/11 @ 3:00. Faxed medical records. Slides and scans will be fedex'ed. Pt is aware.

## 2011-09-11 NOTE — Patient Instructions (Signed)
Follow up in 1 year with Dr Hochrein.  You will receive a letter in the mail 2 months before you are due.  Please call us when you receive this letter to schedule your follow up appointment.  The current medical regimen is effective;  continue present plan and medications.  

## 2011-09-11 NOTE — Assessment & Plan Note (Signed)
The blood pressure is at target. No change in medications is indicated. We will continue with therapeutic lifestyle changes (TLC).  

## 2011-09-11 NOTE — Assessment & Plan Note (Signed)
He does have diffuse native vessel disease as described. I reviewed his catheterization from 2011.  At that time he needed medical management. He has no new symptoms. No change in therapy is indicated. He will continue with aggressive risk reduction.

## 2011-09-11 NOTE — Assessment & Plan Note (Signed)
This is followed by [name], MD, MD. I would suggest a goal LDL less than 100 and HDL greater than 40.

## 2011-09-11 NOTE — Progress Notes (Signed)
The patient presents for followup of his known coronary disease. Since I last saw him he has had no new cardiovascular complaints. Unfortunately he apparently has recurrent lymphoma. He is going to be seen at  [facility] for management of this. He's not having any new cardiovascular complaints. He denies any chest pressure, neck or arm discomfort. He's not reporting any palpitations, presyncope or syncope. He's had no weight gain or edema. He says he is tired. He's been very inactive. He also describes difficulty ambulating in that he's having a hard time picking his feet up and shuffles when he walks. He's not describing any focal motor deficits. He's not had any speech or visual deficits. He has had a fall because of this abnormal gait.  Allergies  Allergen Reactions  . Atorvastatin     Current Outpatient Prescriptions  Medication Sig Dispense Refill  . amLODipine (NORVASC) 2.5 MG tablet Take 2.5 mg by mouth daily.        Marland Kitchen aspirin 325 MG tablet Take 325 mg by mouth daily.        Marland Kitchen atenolol (TENORMIN) 50 MG tablet TAKE ONE TABLET BY MOUTH EVERY DAY  30 tablet  6  . citalopram (CELEXA) 20 MG tablet Take 20 mg by mouth daily.        . furosemide (LASIX) 20 MG tablet Take one daily for 3 days and then only as needed  30 tablet  3  . isosorbide mononitrate (IMDUR) 60 MG 24 hr tablet TAKE ONE TABLET BY MOUTH EVERY DAY  30 tablet  6  . nitroGLYCERIN (NITROSTAT) 0.4 MG SL tablet Place 0.4 mg under the tongue every 5 (five) minutes as needed.        Marland Kitchen PLAVIX 75 MG tablet TAKE ONE TABLET BY MOUTH EVERY DAY  30 each  6  . potassium chloride SA (K-DUR,KLOR-CON) 20 MEQ tablet Take 20 mEq by mouth 2 (two) times daily.        . pravastatin (PRAVACHOL) 40 MG tablet TAKE TWO TABLETS (80 MG) BY MOUTH AT BEDTIME  60 tablet  6    Past Medical History  Diagnosis Date  . thymus ca dx'd 2003    Radiation comp 2003  . NHL (non-Hodgkin's lymphoma) dx'd 2008    Chemo comp 10/2010; rituxin comp 10/2010  .  Hypertension   . CAD 11/12/2007     (Cath 7/11 Patent LIMA to the LAD. Previously known occlusion of a saphenous vein graft to diagonal and    sequential to obtuse marginals. Severe diffuse native obtuse marginal and diagonal branch vessel disease.    Preserved ejection fraction.)  . HYPERLIPIDEMIA 11/12/2007  . HYPERTENSION 11/12/2007  . PERIPHERAL VASCULAR DISEASE 11/12/2007  . ARTHRITIS 11/12/2007  . BENIGN PROSTATIC HYPERTROPHY, HX OF 11/12/2007  . 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 Edema 10/17/2010  . INGUINAL HERNIAS, BILATERAL 11/10/2006  . MANTLE CELL LYMPHOMA INTRA-ABDOMINAL LYMPH NODES 11/12/2007  . Obesity, unspecified 07/05/2009  . SLEEP APNEA 11/12/2007  . History of PTCA 2005  . Osteoarthritis   . Carotid stenosis, bilateral   . Nephrolithiasis     Past Surgical History  Procedure Date  . Coronary artery bypass graft   . Cardiac catheterization 08/2009, 02/2010    ROS:  As stated in the HPI and negative for all other systems.  PHYSICAL EXAM BP 116/64  Pulse 65  Ht 5\' 8"  (1.727 m)  Wt 203 lb 12.8 oz (92.443 kg)  BMI 30.99 kg/m2 GENERAL:  Well appearing HEENT:  Pupils equal  round and reactive, fundi not visualized, oral mucosa unremarkable NECK:  No jugular venous distention, waveform within normal limits, carotid upstroke brisk and symmetric, no bruits, no thyromegaly LYMPHATICS:  No cervical, inguinal adenopathy LUNGS:  Clear to auscultation bilaterally BACK:  No CVA tenderness CHEST:  Well healed sternotomy scar. HEART:  PMI not displaced or sustained,S1 and S2 within normal limits, no S3, no S4, no clicks, no rubs, no murmurs ABD:  Flat, positive bowel sounds normal in frequency in pitch, no bruits, no rebound, no guarding, no midline pulsatile mass, no hepatomegaly, no splenomegaly EXT:  2 plus pulses throughout, moderate right greater than left lower ext edema, no cyanosis no clubbing SKIN:  No rashes no nodules NEURO:  Cranial nerves II through XII grossly intact, motor  grossly intact throughout PSYCH:  Cognitively intact, oriented to person place and time   ASSESSMENT AND PLAN

## 2011-09-12 ENCOUNTER — Ambulatory Visit: Payer: Medicare Other | Admitting: Oncology

## 2011-09-16 ENCOUNTER — Other Ambulatory Visit: Payer: Medicare Other

## 2011-09-19 ENCOUNTER — Telehealth: Payer: Self-pay

## 2011-09-19 NOTE — Telephone Encounter (Signed)
S/w pt: he received paperwork from [facility] and states it talks about research treatment. Pt states he does not want to be a [place] pig and does not want to go to [facility]. He is happy with the care from Dr Arline Asp so far. He stated a neighbor went to [facility] and never came back. "I am not going to [facility], period."  I explained that this would be a 2nd opinion and he would not get treatment on the first visit, that the [facility] MD would confer with Dr Arline Asp about treatment options. Pt still refused to go to [facility]. This message forwarded to Dr Arline Asp.

## 2011-09-23 ENCOUNTER — Ambulatory Visit: Payer: Medicare Other | Admitting: Oncology

## 2011-09-23 ENCOUNTER — Other Ambulatory Visit: Payer: Medicare Other | Admitting: Lab

## 2011-10-02 ENCOUNTER — Ambulatory Visit (HOSPITAL_BASED_OUTPATIENT_CLINIC_OR_DEPARTMENT_OTHER): Payer: Medicare Other | Admitting: Oncology

## 2011-10-02 ENCOUNTER — Telehealth: Payer: Self-pay | Admitting: Oncology

## 2011-10-02 ENCOUNTER — Other Ambulatory Visit (HOSPITAL_BASED_OUTPATIENT_CLINIC_OR_DEPARTMENT_OTHER): Payer: Medicare Other | Admitting: Lab

## 2011-10-02 VITALS — BP 147/77 | HR 54 | Temp 98.5°F | Ht 68.0 in | Wt 201.0 lb

## 2011-10-02 DIAGNOSIS — C8313 Mantle cell lymphoma, intra-abdominal lymph nodes: Secondary | ICD-10-CM

## 2011-10-02 LAB — COMPREHENSIVE METABOLIC PANEL
ALT: 11 U/L (ref 0–53)
AST: 19 U/L (ref 0–37)
Albumin: 3.9 g/dL (ref 3.5–5.2)
CO2: 26 mEq/L (ref 19–32)
Calcium: 9.1 mg/dL (ref 8.4–10.5)
Chloride: 103 mEq/L (ref 96–112)
Creatinine, Ser: 1.14 mg/dL (ref 0.50–1.35)
Potassium: 4 mEq/L (ref 3.5–5.3)
Sodium: 138 mEq/L (ref 135–145)
Total Protein: 7 g/dL (ref 6.0–8.3)

## 2011-10-02 LAB — CBC WITH DIFFERENTIAL/PLATELET
BASO%: 0.3 % (ref 0.0–2.0)
EOS%: 5.4 % (ref 0.0–7.0)
HCT: 39.4 % (ref 38.4–49.9)
MCH: 32.1 pg (ref 27.2–33.4)
MCHC: 33.3 g/dL (ref 32.0–36.0)
MONO#: 0.6 10*3/uL (ref 0.1–0.9)
RBC: 4.09 10*6/uL — ABNORMAL LOW (ref 4.20–5.82)
RDW: 13.2 % (ref 11.0–14.6)
WBC: 8.6 10*3/uL (ref 4.0–10.3)
lymph#: 1.8 10*3/uL (ref 0.9–3.3)

## 2011-10-02 LAB — LACTATE DEHYDROGENASE: LDH: 160 U/L (ref 94–250)

## 2011-10-02 NOTE — Progress Notes (Signed)
CC:   Martin Morris, M.D.  PROBLEM LIST:   1. Mantle cell lymphoma diagnosed in March 2008 with positive bone  marrow initially on 12/03/2006 and then negative bone marrow after  treatment in October 2009. As stated, the patient presented with  obstructive liver abnormalities requiring ERCP and non metal stent  placement by Dr. Melvia Heaps. The stent was subsequently removed  in October 2008. The patient was treated with 8 cycles of Rituxan,  Cytoxan, vincristine and Decadron in combination with Neulasta from  12/19/2006 through 05/15/2007. The patient then received  maintenance Rituxan from July 16, 2007 through February 01, 2010.  While on Rituxan, he had a recurrence of his disease by CT scan  carried out on 01/24/2010. Martin Morris then received treatment  with bendamustine and Rituxan in combination with Neulasta for 6  cycles from 02/19/2010 through 07/26/2010. We have a prior PET scan from  08/22/2010 and CT scans of chest, abdomen, and pelvis from 01/01/2011 that showed no evidence of disease. He has been off treatment since December 2011 and had been doing well without any symptoms until the CT scan of the abdomen and pelvis on 07/01/2011 showed signs of recurrence. Most recent CT scans of abdomen and pelvis with IVC on 09/02/11 show further progression.  2. Coronary artery disease, S/P 2 MIs, most recently 03/21/10. Had heart cath on 03/22/10. LVEF is 40-50%.  3. Peripheral vascular disease.  4. Dyslipidemia.  5. Hypertension.  6. History of depression.  7. History of kidney stones.  8. History of sleep apnea.  9. History of colonic polyps.  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 Kathlee Nations Trigt, status post radiation treatments.    MEDICATIONS:  1. Norvasc 2.5 mg daily.  2. Aspirin 325 mg daily.  3. Atenolol 50 mg daily.  4. Celexa 20 mg daily.  5. Lasix 20 mg as needed.  6. Imdur 60 mg daily.  7. Plavix 75 mg daily.  8. K-Dur  20 mEq twice a day.  9. Pravachol 80 mg at bedtime.  10.Nitrostat 0.4 mg sublingually. The patient has not needed to use  this.  Pneumovax was administered 09/06/2011. Flu shot was administered early December 2012.  HISTORY:  Martin Morris who is now 78 years old is here today with his wife [name].  He was last seen by [place] on 09/06/2011.  We are following him for relapsed mantle cell lymphoma.  We had set Martin Morris up with an appointment at [facility] for 2nd opinion.  Martin Morris canceled that appointment.  He tells [place] that he has not noticed any changes in his condition.  No pain, fever, chills, night sweats, any sense of ill health.  Any loss of appetite, GI, or respiratory problems.  He continues to be somewhat unsteady on his feet and has fallen once again without any serious injury.  When we had seen the patient on 09/06/2011 he had fallen at that time.  Reasons for these falls are not clear.  PHYSICAL EXAM:  There is no obvious change.  Weight is 201 pounds. Height 5 feet 8 inches.  Body surface area 2.09 sq m.  Blood pressure 147/77.  Other vital signs are normal.  There is no scleral icterus. Mouth and pharynx are benign.  No peripheral adenopathy palpable in the neck, supraclavicular, axillary, or inguinal areas.  Heart and lungs: Normal.  I did not appreciate a systolic ejection murmur today.  The patient does not have  a central catheter or port.  Abdomen:  Obese, nontender with no organomegaly present.  I did however notice some palpable firmness superficial in the midline just above the umbilicus. This area measures about 4 x 4 cm, could be the omental recurrence that I am feeling.  Legs remain quite edematous, as noted previously, more so on the right leg than the left, with at least 2+ edema.  Neurologic: Grossly normal.  LABORATORY DATA:  Today, white count 8.6, ANC 5.7, hemoglobin 13.1, hematocrit 39.4, platelets 199,000.  Chemistries today are pending. Chemistries from  09/02/2011 were entirely normal.  Uric acid was 7.1. The patient is not on allopurinol.  Albumin was 3.4, LDH 152.  IMAGING STUDIES: 1. CT scan of chest, abdomen and pelvis with IV contrast on 01/01/2011     were negative for evidence of recurrent lymphoma. 2. PET scan from 08/22/2010 showed no evidence for recurrent lymphoma. 3. CT scan of chest, abdomen and pelvis with IV contrast on 01/01/2011     showed no evidence for lymphoma. 4. CT scan of abdomen and pelvis on 07/01/2011 showed interval     development of peritoneal disease worrisome for recurrence of     lymphoma.  There was new right external iliac lymph node     pathologically enlarged and worrisome for recurrent lymphoma.     There was some stable appearance of mild soft tissue stranding     surrounding the celiac trunk and superior mesenteric artery. 5. Chest x-ray, 2 view, from 09/02/2011 showed no acute findings.  CT     scan of abdomen and pelvis with IV contrast on 09/02/2011 showed     interval progression of lymphadenopathy in the abdomen and pelvis.     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  The 1.1 x 1.7 cm omental nodule     which was measured on the prior study of 07/01/2011 now measures     4.1 x 2.4 cm.  A 2nd discrete soft tissue nodule in the omentum     which previously measured 1.1 x 1.5 cm now measures 2.2 x 3.0 cm.  IMPRESSION AND PLAN:  Martin Morris condition clinically seems to be stable.  Unfortunately his imaging studies show unequivocal progression of disease and unequivocal evidence for recurrence.  I had hoped to obtain an official 2nd opinion.  The patient, for reasons that are still somewhat unclear, was reluctant to go for 2nd opinion.  I will try to make some informal inquiries.  There are clearly several possibilities for treatment which include treating the patient once again with bendamustine and Rituxan.  He last received this back in December 2011. Other options could include the use of bortezomib  with Rituxan and Decadron.  Also fludarabine based program either with cyclophosphamide and mitoxantrone in combination with Rituxan.  Clearly there are other possibilities which could be utilized.  We will give this consideration. In the meantime, I have scheduled Martin Morris for return appointment on or about March 5th at which time we will check CBC, chemistries, and uric acid.  Hopefully we can make plans to get him started within the next couple of weeks.  Given his age and  coronary artery disease, I do not believe he is a good candidate for super aggressive chemotherapy.    ______________________________ Samul Dada, M.D. DSM/MEDQ  D:  10/02/2011  T:  10/02/2011  Job:  [id number]

## 2011-10-02 NOTE — Telephone Encounter (Signed)
Gv pt appt for march2013 °

## 2011-10-02 NOTE — Progress Notes (Signed)
This office note has been dictated.  #[id number]

## 2011-10-06 ENCOUNTER — Other Ambulatory Visit: Payer: Self-pay | Admitting: Oncology

## 2011-10-06 ENCOUNTER — Encounter: Payer: Self-pay | Admitting: Oncology

## 2011-10-06 DIAGNOSIS — C8313 Mantle cell lymphoma, intra-abdominal lymph nodes: Secondary | ICD-10-CM

## 2011-10-06 NOTE — Progress Notes (Signed)
Patient scheduled for labs and chemo for 2/19:  weekly Velcade sub cutaneous, Cytoxan, Decadron and every 3 week Rituxan.  Would like appointment with me or Michail Sermon for 2/19.  For CBC and chemo 2/26 and about 3/5 with appointment with me.  I believe patient has an appointment with me scheduled for 3/6.  Patient will need prescriptions for Zovirax 400 mg bid and Bactrim DS 1 on MWF, ie 3x/week.

## 2011-10-07 ENCOUNTER — Other Ambulatory Visit: Payer: Self-pay | Admitting: Medical Oncology

## 2011-10-07 ENCOUNTER — Telehealth: Payer: Self-pay | Admitting: Medical Oncology

## 2011-10-07 ENCOUNTER — Telehealth: Payer: Self-pay | Admitting: Oncology

## 2011-10-07 DIAGNOSIS — C8590 Non-Hodgkin lymphoma, unspecified, unspecified site: Secondary | ICD-10-CM

## 2011-10-07 MED ORDER — ACYCLOVIR 400 MG PO TABS: 400.0000 mg | ORAL_TABLET | Freq: Two times a day (BID) | ORAL | Status: DC

## 2011-10-07 MED ORDER — SULFAMETHOXAZOLE-TMP DS 800-160 MG PO TABS: 1.0000 | ORAL_TABLET | ORAL | Status: DC

## 2011-10-07 NOTE — Telephone Encounter (Signed)
I called pt and spoke with Wife to let them know that Dr. Arline Asp is planning on starting his chemo 10/15/11. I also let her know he is going to start on 2 new drugs, zovirax and Bactrim. I will mail her drug information on the velcade and cytoxan.

## 2011-10-07 NOTE — Telephone Encounter (Signed)
Called pt with appts for 2/19 2/26 and 3/6 which was already sch   aom

## 2011-10-08 ENCOUNTER — Telehealth: Payer: Self-pay | Admitting: Medical Oncology

## 2011-10-08 NOTE — Telephone Encounter (Signed)
Pam-wife called and asked when should pt start his Bactrim DS and Zovirax. Per Dr. Arline Asp he can start these Friday since he does not start his chemo until next week. She voiced understanding.

## 2011-10-15 ENCOUNTER — Other Ambulatory Visit (HOSPITAL_BASED_OUTPATIENT_CLINIC_OR_DEPARTMENT_OTHER): Payer: Medicare Other | Admitting: Lab

## 2011-10-15 ENCOUNTER — Encounter: Payer: Self-pay | Admitting: Oncology

## 2011-10-15 ENCOUNTER — Ambulatory Visit (HOSPITAL_BASED_OUTPATIENT_CLINIC_OR_DEPARTMENT_OTHER): Payer: Medicare Other

## 2011-10-15 VITALS — BP 130/74 | HR 55 | Temp 98.5°F

## 2011-10-15 DIAGNOSIS — C8313 Mantle cell lymphoma, intra-abdominal lymph nodes: Secondary | ICD-10-CM

## 2011-10-15 DIAGNOSIS — Z5111 Encounter for antineoplastic chemotherapy: Secondary | ICD-10-CM

## 2011-10-15 LAB — CBC WITH DIFFERENTIAL/PLATELET
Eosinophils Absolute: 0.4 10*3/uL (ref 0.0–0.5)
HCT: 38.8 % (ref 38.4–49.9)
HGB: 13.1 g/dL (ref 13.0–17.1)
LYMPH%: 22.7 % (ref 14.0–49.0)
MONO#: 0.7 10*3/uL (ref 0.1–0.9)
NEUT#: 5 10*3/uL (ref 1.5–6.5)
NEUT%: 62.4 % (ref 39.0–75.0)
Platelets: 166 10*3/uL (ref 140–400)
WBC: 8 10*3/uL (ref 4.0–10.3)

## 2011-10-15 LAB — COMPREHENSIVE METABOLIC PANEL
CO2: 25 mEq/L (ref 19–32)
Calcium: 9.4 mg/dL (ref 8.4–10.5)
Chloride: 105 mEq/L (ref 96–112)
Creatinine, Ser: 1.25 mg/dL (ref 0.50–1.35)
Glucose, Bld: 74 mg/dL (ref 70–99)
Total Bilirubin: 0.7 mg/dL (ref 0.3–1.2)
Total Protein: 6.6 g/dL (ref 6.0–8.3)

## 2011-10-15 LAB — URIC ACID: Uric Acid, Serum: 5.8 mg/dL (ref 4.0–7.8)

## 2011-10-15 LAB — LACTATE DEHYDROGENASE: LDH: 156 U/L (ref 94–250)

## 2011-10-15 MED ORDER — ACETAMINOPHEN 325 MG PO TABS
650.0000 mg | ORAL_TABLET | Freq: Once | ORAL | Status: AC
  Administered 2011-10-15: 650 mg via ORAL

## 2011-10-15 MED ORDER — BORTEZOMIB CHEMO SQ INJECTION 3.5 MG (2.5MG/ML)
1.3000 mg/m2 | Freq: Once | INTRAMUSCULAR | Status: AC
  Administered 2011-10-15: 2.75 mg via SUBCUTANEOUS
  Filled 2011-10-15: qty 2.75

## 2011-10-15 MED ORDER — DIPHENHYDRAMINE HCL 25 MG PO CAPS
25.0000 mg | ORAL_CAPSULE | Freq: Once | ORAL | Status: AC
  Administered 2011-10-15: 25 mg via ORAL

## 2011-10-15 MED ORDER — SODIUM CHLORIDE 0.9 % IV SOLN
375.0000 mg/m2 | Freq: Once | INTRAVENOUS | Status: AC
  Administered 2011-10-15: 800 mg via INTRAVENOUS
  Filled 2011-10-15: qty 80

## 2011-10-15 MED ORDER — SODIUM CHLORIDE 0.9 % IV SOLN
150.0000 mg/m2 | Freq: Once | INTRAVENOUS | Status: AC
  Administered 2011-10-15: 320 mg via INTRAVENOUS
  Filled 2011-10-15: qty 16

## 2011-10-15 MED ORDER — SODIUM CHLORIDE 0.9 % IV SOLN
Freq: Once | INTRAVENOUS | Status: AC
  Administered 2011-10-15: 11:00:00 via INTRAVENOUS

## 2011-10-15 MED ORDER — BORTEZOMIB CHEMO IV INJECTION 3.5 MG: 1.3000 mg/m2 | Freq: Once | INTRAMUSCULAR | Status: DC

## 2011-10-15 MED ORDER — DEXAMETHASONE SODIUM PHOSPHATE 4 MG/ML IJ SOLN
20.0000 mg | Freq: Once | INTRAMUSCULAR | Status: AC
  Administered 2011-10-15: 20 mg via INTRAVENOUS

## 2011-10-15 MED ORDER — ONDANSETRON 8 MG/50ML IVPB (CHCC)
8.0000 mg | Freq: Once | INTRAVENOUS | Status: AC
  Administered 2011-10-15: 8 mg via INTRAVENOUS

## 2011-10-15 NOTE — Progress Notes (Signed)
Patient was seen briefly while he was in chemo treatment area getting his first cycle of velcade (sub cut), cytoxan and Decadron.  He denied any changes in his condition and was without problems or complaints.  He is on a weekly treatment program at present.

## 2011-10-15 NOTE — Progress Notes (Signed)
Increase Rituximab dose, rate of 146/ volume 73

## 2011-10-15 NOTE — Patient Instructions (Signed)
[facility] Discharge Instructions for Patients Receiving Chemotherapy  Today you received the following chemotherapy agents Velcade, Rituximab, and Cytoxan.  To help prevent nausea and vomiting after your treatment, we encourage you to take your nausea medication as prescribed by your physician.  If you develop nausea and vomiting that is not controlled by your nausea medication, call the clinic. If it is after clinic hours your family physician or the after hours number for the clinic or go to the Emergency Department.   BELOW ARE SYMPTOMS THAT SHOULD BE REPORTED IMMEDIATELY:  *FEVER GREATER THAN 100.5 F  *CHILLS WITH OR WITHOUT FEVER  NAUSEA AND VOMITING THAT IS NOT CONTROLLED WITH YOUR NAUSEA MEDICATION  *UNUSUAL SHORTNESS OF BREATH  *UNUSUAL BRUISING OR BLEEDING  TENDERNESS IN MOUTH AND THROAT WITH OR WITHOUT PRESENCE OF ULCERS  *URINARY PROBLEMS  *BOWEL PROBLEMS  UNUSUAL RASH Items with * indicate a potential emergency and should be followed up as soon as possible.  One of the nurses will contact you 24 hours after your treatment. Please let the nurse know about any problems that you may have experienced. Feel free to call the clinic you have any questions or concerns.  The clinic phone number is [phone].   I have been informed and understand all the instructions given to me. I know to contact the clinic, my physician, or go to the Emergency Department if any problems should occur. I do not have any questions at this time, but understand that I may call the clinic during office hours   should I have any questions or need assistance in obtaining follow up care.    __________________________________________  _____________  __________ Signature of Patient or Authorized Representative            Date                   Time    __________________________________________ Nurse's Signature

## 2011-10-15 NOTE — Progress Notes (Addendum)
VSS Rituxan increased to 200mg /hr rate = 83 ml/hr ,vol =42 ml/hr

## 2011-10-15 NOTE — Progress Notes (Signed)
VSS rate Rituxan increased to 150 mg/hr at rate 63 ml/hr vol 31 ml/hr x 30 minutes

## 2011-10-15 NOTE — Progress Notes (Signed)
Tolerating treatment well. VSS , rate increased to 125 with volume of 63

## 2011-10-15 NOTE — Progress Notes (Signed)
New rate 167 for volume 83. VSS

## 2011-10-15 NOTE — Progress Notes (Signed)
VSS. Rate increase 42 for volume of 21.

## 2011-10-15 NOTE — Progress Notes (Signed)
Rituximab increase for rate of 104 and volume 52. VSS.

## 2011-10-17 ENCOUNTER — Telehealth: Payer: Self-pay | Admitting: *Deleted

## 2011-10-17 NOTE — Telephone Encounter (Signed)
Mr. Welles is doing well.  Denies any side effects or changes.  Asked tha he call if any symptoms occur.

## 2011-10-17 NOTE — Telephone Encounter (Signed)
Message copied by Augusto Garbe on Thu Oct 17, 2011 12:11 PM ------      Message from: Sabino Snipes      Created: Tue Oct 15, 2011  4:33 PM      Regarding: chemo f/u      Contact: [phone]       1st x rituxan, velcade, ctx

## 2011-10-19 ENCOUNTER — Other Ambulatory Visit: Payer: Self-pay | Admitting: Hematology & Oncology

## 2011-10-19 ENCOUNTER — Telehealth: Payer: Self-pay | Admitting: Hematology & Oncology

## 2011-10-19 DIAGNOSIS — L0390 Cellulitis, unspecified: Secondary | ICD-10-CM

## 2011-10-19 MED ORDER — CEPHALEXIN 500 MG PO CAPS: 500.0000 mg | ORAL_CAPSULE | Freq: Four times a day (QID) | ORAL | Status: AC

## 2011-10-19 NOTE — Telephone Encounter (Signed)
Pt developed an area of redness where he got Velcade injection last week.  I told his wife to put some ice on this area and I called in 'script for Keflex (500mg  qid x 5d).  He has an appt for next Tues.  I told her to call back if there are any further problems.  Martin E.

## 2011-10-22 ENCOUNTER — Other Ambulatory Visit: Payer: Medicare Other | Admitting: Lab

## 2011-10-22 ENCOUNTER — Encounter: Payer: Self-pay | Admitting: Oncology

## 2011-10-22 ENCOUNTER — Ambulatory Visit (HOSPITAL_BASED_OUTPATIENT_CLINIC_OR_DEPARTMENT_OTHER): Payer: Medicare Other

## 2011-10-22 VITALS — BP 133/81 | HR 81 | Temp 97.3°F

## 2011-10-22 DIAGNOSIS — C8313 Mantle cell lymphoma, intra-abdominal lymph nodes: Secondary | ICD-10-CM

## 2011-10-22 DIAGNOSIS — R1110 Vomiting, unspecified: Secondary | ICD-10-CM

## 2011-10-22 LAB — CBC WITH DIFFERENTIAL/PLATELET
Basophils Absolute: 0 10*3/uL (ref 0.0–0.1)
EOS%: 1.1 % (ref 0.0–7.0)
Eosinophils Absolute: 0.1 10*3/uL (ref 0.0–0.5)
HCT: 40.3 % (ref 38.4–49.9)
HGB: 13.8 g/dL (ref 13.0–17.1)
MCH: 31.9 pg (ref 27.2–33.4)
MCV: 93.3 fL (ref 79.3–98.0)
NEUT%: 83 % — ABNORMAL HIGH (ref 39.0–75.0)
lymph#: 1.2 10*3/uL (ref 0.9–3.3)

## 2011-10-22 LAB — BASIC METABOLIC PANEL
BUN: 23 mg/dL (ref 6–23)
CO2: 30 mEq/L (ref 19–32)
Chloride: 101 mEq/L (ref 96–112)
Glucose, Bld: 123 mg/dL — ABNORMAL HIGH (ref 70–99)
Potassium: 4.3 mEq/L (ref 3.5–5.3)

## 2011-10-22 LAB — MAGNESIUM: Magnesium: 1.9 mg/dL (ref 1.5–2.5)

## 2011-10-22 MED ORDER — SODIUM CHLORIDE 0.9 % IV SOLN
INTRAVENOUS | Status: DC
  Administered 2011-10-22: 10:00:00 via INTRAVENOUS

## 2011-10-22 NOTE — Progress Notes (Signed)
Orders verified with Dr Arline Asp, not Dr Truett Perna.

## 2011-10-22 NOTE — Progress Notes (Signed)
Ms. Martin Morris states that Martin Morris has been vomitng all evening and this morning. Orthastatic vital signs taken. Pt states feels very weak. Last intake without sickness was breakfast yesterday 10-21-11., 0930 Spoke with Dr Johnette Abraham, pt to have stat BMET and Mag drawn, IVF at 220. Until labs return. Orders readback and confirmed. 1150 Pt to hold this weeks treatment and continue with next week per Dr Truett Perna.

## 2011-10-22 NOTE — Progress Notes (Signed)
Chemotherapy scheduled for today was held because patient had been having nausea, vomiting and diarrhea.  He was given at least 500 ml of IVF and felt better.  We think he most likely has a viral illness.  He will return next week for his weekly treatment and an appointment with me.  His first treatment was on 2/19.

## 2011-10-24 ENCOUNTER — Other Ambulatory Visit: Payer: Self-pay | Admitting: Certified Registered Nurse Anesthetist

## 2011-10-30 ENCOUNTER — Ambulatory Visit (HOSPITAL_BASED_OUTPATIENT_CLINIC_OR_DEPARTMENT_OTHER): Payer: Medicare Other

## 2011-10-30 ENCOUNTER — Other Ambulatory Visit (HOSPITAL_BASED_OUTPATIENT_CLINIC_OR_DEPARTMENT_OTHER): Payer: Medicare Other | Admitting: Lab

## 2011-10-30 ENCOUNTER — Encounter: Payer: Self-pay | Admitting: Oncology

## 2011-10-30 ENCOUNTER — Ambulatory Visit: Payer: Medicare Other | Admitting: Oncology

## 2011-10-30 ENCOUNTER — Other Ambulatory Visit: Payer: Medicare Other | Admitting: Lab

## 2011-10-30 VITALS — BP 165/84 | HR 68 | Temp 97.7°F | Ht 68.0 in | Wt 197.0 lb

## 2011-10-30 DIAGNOSIS — Z5111 Encounter for antineoplastic chemotherapy: Secondary | ICD-10-CM

## 2011-10-30 DIAGNOSIS — Z5112 Encounter for antineoplastic immunotherapy: Secondary | ICD-10-CM

## 2011-10-30 DIAGNOSIS — C8313 Mantle cell lymphoma, intra-abdominal lymph nodes: Secondary | ICD-10-CM

## 2011-10-30 LAB — CBC WITH DIFFERENTIAL/PLATELET
BASO%: 0.5 % (ref 0.0–2.0)
Basophils Absolute: 0 10*3/uL (ref 0.0–0.1)
EOS%: 3.4 % (ref 0.0–7.0)
HCT: 37.8 % — ABNORMAL LOW (ref 38.4–49.9)
LYMPH%: 17 % (ref 14.0–49.0)
MCH: 31.8 pg (ref 27.2–33.4)
MCHC: 34.1 g/dL (ref 32.0–36.0)
MCV: 93.1 fL (ref 79.3–98.0)
MONO%: 8.9 % (ref 0.0–14.0)
NEUT%: 70.2 % (ref 39.0–75.0)
Platelets: 234 10*3/uL (ref 140–400)

## 2011-10-30 LAB — COMPREHENSIVE METABOLIC PANEL
AST: 21 U/L (ref 0–37)
Alkaline Phosphatase: 74 U/L (ref 39–117)
BUN: 16 mg/dL (ref 6–23)
Creatinine, Ser: 1.1 mg/dL (ref 0.50–1.35)
Glucose, Bld: 99 mg/dL (ref 70–99)

## 2011-10-30 LAB — URIC ACID: Uric Acid, Serum: 7.5 mg/dL (ref 4.0–7.8)

## 2011-10-30 MED ORDER — SODIUM CHLORIDE 0.9 % IV SOLN
Freq: Once | INTRAVENOUS | Status: AC
  Administered 2011-10-30: 11:00:00 via INTRAVENOUS

## 2011-10-30 MED ORDER — BORTEZOMIB CHEMO SQ INJECTION 3.5 MG (2.5MG/ML)
1.3000 mg/m2 | Freq: Once | INTRAMUSCULAR | Status: AC
  Administered 2011-10-30: 2.75 mg via SUBCUTANEOUS
  Filled 2011-10-30: qty 2.75

## 2011-10-30 MED ORDER — SODIUM CHLORIDE 0.9 % IV SOLN
150.0000 mg/m2 | Freq: Once | INTRAVENOUS | Status: AC
  Administered 2011-10-30: 320 mg via INTRAVENOUS
  Filled 2011-10-30: qty 16

## 2011-10-30 MED ORDER — ONDANSETRON 8 MG/50ML IVPB (CHCC)
8.0000 mg | Freq: Once | INTRAVENOUS | Status: AC
  Administered 2011-10-30: 8 mg via INTRAVENOUS

## 2011-10-30 MED ORDER — DEXAMETHASONE SODIUM PHOSPHATE 4 MG/ML IJ SOLN
20.0000 mg | Freq: Once | INTRAMUSCULAR | Status: AC
  Administered 2011-10-30: 20 mg via INTRAVENOUS

## 2011-10-30 NOTE — Progress Notes (Signed)
CC:   Martin Morris, M.D.  PROBLEM LIST:  1. Mantle cell lymphoma diagnosed in March 2008 with positive bone  marrow initially on 12/03/2006 and then negative bone marrow after  treatment in October 2009. As stated, the patient presented with  obstructive liver abnormalities requiring ERCP and non metal stent  placement by Dr. Melvia Heaps. The stent was subsequently removed  in October 2008. The patient was treated with 8 cycles of Rituxan,  Cytoxan, vincristine and Decadron in combination with Neulasta from  12/19/2006 through 05/15/2007. The patient then received  maintenance Rituxan from July 16, 2007 through February 01, 2010.  While on Rituxan, he had a recurrence of his disease by CT scan  carried out on 01/24/2010. Martin Morris then received treatment  with bendamustine and Rituxan in combination with Neulasta for 6  cycles from 02/19/2010 through 07/26/2010. We have a prior PET scan from  08/22/2010 and CT scans of chest, abdomen, and pelvis from 01/01/2011 that showed no evidence of disease. He has been off treatment since December 2011 and had been doing well without any symptoms until the CT scan of the abdomen and pelvis on 07/01/2011 showed signs of recurrence. Most recent CT scans of abdomen and pelvis with IVC on 09/02/11 show further progression. Rituxan, subcutaneous Velcade, intravenous Cytoxan and Decadron  were initiated on 10/15/2011.  2. Coronary artery disease, S/P 2 MIs, most recently 03/21/10. Had heart cath on 03/22/10. LVEF is 40-50%.  3. Peripheral vascular disease.  4. Dyslipidemia.  5. Hypertension.  6. History of depression.  7. History of kidney stones.  8. History of sleep apnea.  9. History of colonic polyps.  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 Kathlee Nations Trigt, status post radiation treatments.    MEDICATIONS:  1. Norvasc 2.5 mg daily.  2. Aspirin 325 mg daily.  3. Atenolol 50 mg daily.  4. Celexa 20 mg  daily.  5. Lasix 20 mg as needed.  6. Imdur 60 mg daily.  7. Plavix 75 mg daily.  8. K-Dur 20 mEq twice a day.  9. Pravachol 80 mg at bedtime.  10.Nitrostat 0.4 mg sublingually. The patient has not needed to use  this.   Pneumovax was administered 09/06/2011.  Flu shot was administered early December 2012.   HISTORY:  I saw Martin Morris today for followup and treatment of his relapsed mantle cell lymphoma.  Martin Morris is accompanied by his wife Martin Morris.  He was last seen by [place] on 10/02/2011, although I had seen him in the chemotherapy treatment area on 02/19 and again on 02/26. Chemotherapy with Rituxan 800 mg being given every 3 weeks in combination with weekly subcutaneous Velcade 2.7 mg, Cytoxan 320 mg and Decadron 20 mg was initiated on 02/19.  The patient tolerated his treatment well.  He came in for his next scheduled treatment on 02/26. Scheduled chemotherapy was held on that day because the patient was having nausea, vomiting and diarrhea.  He was given 500 mL of intravenous fluids and felt better.  His symptoms lasted about 2 days. His wife contracted a similar illness, thus supporting our impression that these symptoms were unrelated to his lymphoma and chemotherapy and related to a viral illness possibly, Norovirus.  Martin Morris is without any complaints today.  As stated, he had no symptoms or side effects related to his chemotherapy.  PHYSICAL EXAM:  Vital signs: There is little change.  Weight is 197 pounds, which is basically  stable.  Height 5 feet 8 inches, body surface area 2.07 m square.  Blood pressure today slightly elevated 165/84. Other vital signs are normal.  General:  Essentially stable from that done on 10/02/2011.  The only significant finding is some firmness in the midline just above the umbilicus.  This area measures today 5 x 5 cm which may be slightly bigger than I measured it on 10/02/2011.  There is some purpura over the abdominal wall.  The  patient continues to have 1 to 2+ edema of both ankles.  I did not appreciate any organomegaly or lymphadenopathy.  LABORATORY DATA:  Today white count 7.6, ANC 5.3, hemoglobin 12.9, hematocrit 37.8, and platelets 234,000.  Chemistries:  Including an LDH and uric acid are pending.  Chemistries from 10/22/2011 were basically stable.  BUN was 23 creatinine, 1.27, potassium 4.3, and magnesium 1.9. On 10/15/2011 chemistries were normal including LDH of 156, uric acid of 5.8.  IMAGING STUDIES:  1. CT scan of chest, abdomen and pelvis with IV contrast on 01/01/2011  were negative for evidence of recurrent lymphoma.  2. PET scan from 08/22/2010 showed no evidence for recurrent lymphoma.  3. CT scan of chest, abdomen and pelvis with IV contrast on 01/01/2011  showed no evidence for lymphoma.  4. CT scan of abdomen and pelvis on 07/01/2011 showed interval  development of peritoneal disease worrisome for recurrence of  lymphoma. There was new right external iliac lymph node  pathologically enlarged and worrisome for recurrent lymphoma.  There was some stable appearance of mild soft tissue stranding  surrounding the celiac trunk and superior mesenteric artery.  5. Chest x-ray, 2 view, from 09/02/2011 showed no acute findings. CT  scan of abdomen and pelvis with IV contrast on 09/02/2011 showed  interval progression of lymphadenopathy in the abdomen and pelvis.  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 The 1.1 x 1.7 cm omental nodule  which was measured on the prior study of 07/01/2011 now measures  4.1 x 2.4 cm. A 2nd discrete soft tissue nodule in the omentum  which previously measured 1.1 x 1.5 cm now measures 2.2 x 3.0 cm.   IMPRESSION AND PLAN:  There have been no significant changes in Martin Morris condition.  He is scheduled for 2nd cycle of chemotherapy.  He will not receive Rituxan today.  He is receiving Velcade 2.75 mg subcutaneously and IV Cytoxan 320 mg and Decadron 20 mg IV.  We  will continue to follow the omental mass.  We may want to consider increasing the weekly Cytoxan dose.  Martin Morris will be due for CBC and chemotherapy on March 13th.  He will be due for Rituxan on that day.  He will be due for CBC and chemotherapy without Rituxan on March 20th and we will plan to see Mr. Bilotti again 3 weeks from today which will be around March 27th at which time we will check CBC and chemistries.  He will also be due for chemotherapy on 03/27.  His next dose of Rituxan will be somewhere around April 3rd.    ______________________________ Samul Dada, M.D. DSM/MEDQ  D:  10/30/2011  T:  10/30/2011  Job:  [id number]

## 2011-10-30 NOTE — Progress Notes (Signed)
This office note has been dictated.  #[id number]

## 2011-10-31 ENCOUNTER — Ambulatory Visit: Payer: Medicare Other

## 2011-11-01 ENCOUNTER — Other Ambulatory Visit: Payer: Self-pay | Admitting: Oncology

## 2011-11-04 ENCOUNTER — Other Ambulatory Visit: Payer: Self-pay | Admitting: Cardiovascular Disease

## 2011-11-07 ENCOUNTER — Other Ambulatory Visit (HOSPITAL_BASED_OUTPATIENT_CLINIC_OR_DEPARTMENT_OTHER): Payer: Medicare Other | Admitting: Lab

## 2011-11-07 ENCOUNTER — Ambulatory Visit (HOSPITAL_BASED_OUTPATIENT_CLINIC_OR_DEPARTMENT_OTHER): Payer: Medicare Other

## 2011-11-07 VITALS — BP 118/67 | HR 62 | Temp 97.7°F

## 2011-11-07 DIAGNOSIS — Z5112 Encounter for antineoplastic immunotherapy: Secondary | ICD-10-CM

## 2011-11-07 DIAGNOSIS — C8313 Mantle cell lymphoma, intra-abdominal lymph nodes: Secondary | ICD-10-CM

## 2011-11-07 LAB — CBC WITH DIFFERENTIAL/PLATELET
EOS%: 4.5 % (ref 0.0–7.0)
Eosinophils Absolute: 0.4 10*3/uL (ref 0.0–0.5)
LYMPH%: 21.9 % (ref 14.0–49.0)
MCH: 31.6 pg (ref 27.2–33.4)
MCV: 94 fL (ref 79.3–98.0)
MONO%: 10.7 % (ref 0.0–14.0)
NEUT#: 5 10*3/uL (ref 1.5–6.5)
Platelets: 203 10*3/uL (ref 140–400)
RBC: 3.86 10*6/uL — ABNORMAL LOW (ref 4.20–5.82)
nRBC: 0 % (ref 0–0)

## 2011-11-07 MED ORDER — DEXAMETHASONE SODIUM PHOSPHATE 4 MG/ML IJ SOLN
20.0000 mg | Freq: Once | INTRAMUSCULAR | Status: AC
  Administered 2011-11-07: 20 mg via INTRAVENOUS

## 2011-11-07 MED ORDER — SODIUM CHLORIDE 0.9 % IV SOLN
Freq: Once | INTRAVENOUS | Status: AC
  Administered 2011-11-07: 10:00:00 via INTRAVENOUS

## 2011-11-07 MED ORDER — SODIUM CHLORIDE 0.9 % IV SOLN
150.0000 mg/m2 | Freq: Once | INTRAVENOUS | Status: AC
  Administered 2011-11-07: 320 mg via INTRAVENOUS
  Filled 2011-11-07: qty 16

## 2011-11-07 MED ORDER — BORTEZOMIB CHEMO SQ INJECTION 3.5 MG (2.5MG/ML)
1.3000 mg/m2 | Freq: Once | INTRAMUSCULAR | Status: AC
  Administered 2011-11-07: 2.75 mg via SUBCUTANEOUS
  Filled 2011-11-07: qty 2.75

## 2011-11-07 MED ORDER — SODIUM CHLORIDE 0.9 % IV SOLN
375.0000 mg/m2 | Freq: Once | INTRAVENOUS | Status: AC
  Administered 2011-11-07: 800 mg via INTRAVENOUS
  Filled 2011-11-07: qty 80

## 2011-11-07 MED ORDER — DIPHENHYDRAMINE HCL 25 MG PO CAPS
25.0000 mg | ORAL_CAPSULE | Freq: Once | ORAL | Status: AC
  Administered 2011-11-07: 25 mg via ORAL

## 2011-11-07 MED ORDER — ACETAMINOPHEN 325 MG PO TABS
650.0000 mg | ORAL_TABLET | Freq: Once | ORAL | Status: AC
  Administered 2011-11-07: 650 mg via ORAL

## 2011-11-07 MED ORDER — ONDANSETRON 8 MG/50ML IVPB (CHCC)
8.0000 mg | Freq: Once | INTRAVENOUS | Status: AC
  Administered 2011-11-07: 8 mg via INTRAVENOUS

## 2011-11-07 NOTE — Progress Notes (Signed)
Vss. Pt has no s/s of reaction.  Rituxan rate increased to 200 ml/hr x 1 hour (rapid infusion).

## 2011-11-13 ENCOUNTER — Ambulatory Visit (HOSPITAL_BASED_OUTPATIENT_CLINIC_OR_DEPARTMENT_OTHER): Payer: Medicare Other

## 2011-11-13 ENCOUNTER — Other Ambulatory Visit: Payer: Self-pay

## 2011-11-13 ENCOUNTER — Telehealth: Payer: Self-pay

## 2011-11-13 ENCOUNTER — Telehealth: Payer: Self-pay | Admitting: Cardiology

## 2011-11-13 ENCOUNTER — Other Ambulatory Visit: Payer: Self-pay | Admitting: Cardiology

## 2011-11-13 ENCOUNTER — Other Ambulatory Visit (HOSPITAL_BASED_OUTPATIENT_CLINIC_OR_DEPARTMENT_OTHER): Payer: Medicare Other | Admitting: Lab

## 2011-11-13 VITALS — BP 162/82 | HR 61 | Temp 96.9°F

## 2011-11-13 DIAGNOSIS — Z5112 Encounter for antineoplastic immunotherapy: Secondary | ICD-10-CM

## 2011-11-13 DIAGNOSIS — C8583 Other specified types of non-Hodgkin lymphoma, intra-abdominal lymph nodes: Secondary | ICD-10-CM

## 2011-11-13 DIAGNOSIS — C8313 Mantle cell lymphoma, intra-abdominal lymph nodes: Secondary | ICD-10-CM

## 2011-11-13 LAB — CBC WITH DIFFERENTIAL/PLATELET
Basophils Absolute: 0 10*3/uL (ref 0.0–0.1)
EOS%: 4.3 % (ref 0.0–7.0)
HCT: 35.6 % — ABNORMAL LOW (ref 38.4–49.9)
HGB: 12 g/dL — ABNORMAL LOW (ref 13.0–17.1)
MCH: 31.8 pg (ref 27.2–33.4)
MCV: 94.4 fL (ref 79.3–98.0)
MONO%: 9.7 % (ref 0.0–14.0)
NEUT%: 61.9 % (ref 39.0–75.0)

## 2011-11-13 MED ORDER — SODIUM CHLORIDE 0.9 % IV SOLN
150.0000 mg/m2 | Freq: Once | INTRAVENOUS | Status: AC
  Administered 2011-11-13: 320 mg via INTRAVENOUS
  Filled 2011-11-13: qty 16

## 2011-11-13 MED ORDER — DEXAMETHASONE SODIUM PHOSPHATE 4 MG/ML IJ SOLN
20.0000 mg | Freq: Once | INTRAMUSCULAR | Status: AC
  Administered 2011-11-13: 20 mg via INTRAVENOUS

## 2011-11-13 MED ORDER — BORTEZOMIB CHEMO SQ INJECTION 3.5 MG (2.5MG/ML)
1.3000 mg/m2 | Freq: Once | INTRAMUSCULAR | Status: AC
  Administered 2011-11-13: 2.75 mg via SUBCUTANEOUS
  Filled 2011-11-13: qty 2.75

## 2011-11-13 MED ORDER — ONDANSETRON 8 MG/50ML IVPB (CHCC)
8.0000 mg | Freq: Once | INTRAVENOUS | Status: AC
  Administered 2011-11-13: 8 mg via INTRAVENOUS

## 2011-11-13 MED ORDER — SODIUM CHLORIDE 0.9 % IV SOLN
Freq: Once | INTRAVENOUS | Status: AC
  Administered 2011-11-13: 10:00:00 via INTRAVENOUS

## 2011-11-13 NOTE — Telephone Encounter (Signed)
Will forward to Dr Antoine Poche for his approval.

## 2011-11-13 NOTE — Telephone Encounter (Signed)
New problem:  Per Inetta Fermo calling, Patient need to stop plavix on tomorrow for 5 days.   For [facility] cath placement.

## 2011-11-13 NOTE — Telephone Encounter (Signed)
IR called stating they were contacting PCP about pt's plavix. He will need to stop plavix for 5 days to get PAC insertion. If any problems arise IR will call us back. They are aiming to place port about 3/27

## 2011-11-14 NOTE — Telephone Encounter (Signed)
Tina aware.

## 2011-11-14 NOTE — Telephone Encounter (Signed)
OK to hold Plavix 5 days prior to procedure. 

## 2011-11-18 ENCOUNTER — Other Ambulatory Visit: Payer: Self-pay | Admitting: Physician Assistant

## 2011-11-19 ENCOUNTER — Other Ambulatory Visit: Payer: Self-pay | Admitting: Radiology

## 2011-11-20 ENCOUNTER — Ambulatory Visit (HOSPITAL_COMMUNITY)
Admission: RE | Admit: 2011-11-20 | Discharge: 2011-11-20 | Disposition: A | Discharge status: Home / Self Care | Payer: Medicare Other | Source: Ambulatory Visit | Attending: Oncology | Admitting: Oncology

## 2011-11-20 ENCOUNTER — Encounter (HOSPITAL_COMMUNITY): Payer: Self-pay

## 2011-11-20 ENCOUNTER — Ambulatory Visit: Payer: Medicare Other

## 2011-11-20 ENCOUNTER — Other Ambulatory Visit: Payer: Self-pay | Admitting: Oncology

## 2011-11-20 ENCOUNTER — Other Ambulatory Visit: Payer: Medicare Other

## 2011-11-20 DIAGNOSIS — C8583 Other specified types of non-Hodgkin lymphoma, intra-abdominal lymph nodes: Secondary | ICD-10-CM

## 2011-11-20 LAB — CBC
HCT: 39.4 % (ref 39.0–52.0)
MCHC: 32.7 g/dL (ref 30.0–36.0)
Platelets: 159 10*3/uL (ref 150–400)
RDW: 14.2 % (ref 11.5–15.5)
WBC: 5.2 10*3/uL (ref 4.0–10.5)

## 2011-11-20 LAB — APTT: aPTT: 21 seconds — ABNORMAL LOW (ref 24–37)

## 2011-11-20 MED ORDER — FENTANYL CITRATE 0.05 MG/ML IJ SOLN
INTRAMUSCULAR | Status: AC | PRN
  Administered 2011-11-20: 50 ug via INTRAVENOUS

## 2011-11-20 MED ORDER — SODIUM CHLORIDE 0.9 % IV SOLN
INTRAVENOUS | Status: DC
  Administered 2011-11-20: 13:00:00 via INTRAVENOUS

## 2011-11-20 MED ORDER — MIDAZOLAM HCL 5 MG/5ML IJ SOLN
INTRAMUSCULAR | Status: AC | PRN
  Administered 2011-11-20: 1 mg via INTRAVENOUS

## 2011-11-20 MED ORDER — FENTANYL CITRATE 0.05 MG/ML IJ SOLN
INTRAMUSCULAR | Status: AC
  Filled 2011-11-20: qty 2

## 2011-11-20 MED ORDER — LIDOCAINE HCL 1 % IJ SOLN
INTRAMUSCULAR | Status: AC
  Filled 2011-11-20: qty 20

## 2011-11-20 MED ORDER — HEPARIN SOD (PORK) LOCK FLUSH 100 UNIT/ML IV SOLN
INTRAVENOUS | Status: AC | PRN
  Administered 2011-11-20: 500 [IU]

## 2011-11-20 MED ORDER — FENTANYL CITRATE 0.05 MG/ML IJ SOLN
INTRAMUSCULAR | Status: AC
  Filled 2011-11-20: qty 4

## 2011-11-20 MED ORDER — CEFAZOLIN SODIUM-DEXTROSE 2-3 GM-% IV SOLR
INTRAVENOUS | Status: AC
  Filled 2011-11-20: qty 50

## 2011-11-20 MED ORDER — MIDAZOLAM HCL 2 MG/2ML IJ SOLN
INTRAMUSCULAR | Status: AC
  Filled 2011-11-20: qty 6

## 2011-11-20 MED ORDER — CEFAZOLIN SODIUM 1-5 GM-% IV SOLN
1.0000 g | Freq: Once | INTRAVENOUS | Status: DC
Start: 2011-11-20 — End: 2011-11-20

## 2011-11-20 MED ORDER — CEFAZOLIN SODIUM-DEXTROSE 2-3 GM-% IV SOLR
2.0000 g | Freq: Once | INTRAVENOUS | Status: AC
  Administered 2011-11-20: 2 g via INTRAVENOUS

## 2011-11-20 NOTE — Procedures (Signed)
Placement of right IJ port.  Tip in SVC.  Ready to use.

## 2011-11-20 NOTE — Discharge Instructions (Signed)
Implanted Port Instructions  An implanted port is a central line that has a round shape and is placed under the skin. It is used for long-term IV (intravenous) access for:   Medicine.   Fluids.   Liquid nutrition, such as TPN (total parenteral nutrition).   Blood samples.  Ports can be placed:   In the chest area just below the collarbone (this is the most common place.)   In the arms.   In the belly (abdomen) area.   In the legs.  PARTS OF THE PORT  A port has 2 main parts:   The reservoir. The reservoir is round, disc-shaped, and will be a small, raised area under your skin.   The reservoir is the part where a needle is inserted (accessed) to either give medicines or to draw blood.   The catheter. The catheter is a long, slender tube that extends from the reservoir. The catheter is placed into a large vein.   Medicine that is inserted into the reservoir goes into the catheter and then into the vein.  INSERTION OF THE PORT   The port is surgically placed in either an operating room or in a procedural area (interventional radiology).   Medicine may be given to help you relax during the procedure.   The skin where the port will be inserted is numbed (local anesthetic).   1 or 2 small cuts (incisions) will be made in the skin to insert the port.   The port can be used after it has been inserted.  INCISION SITE CARE   The incision site may have small adhesive strips on it. This helps keep the incision site closed. Sometimes, no adhesive strips are placed. Instead of adhesive strips, a special kind of surgical glue is used to keep the incision closed.   If adhesive strips were placed on the incision sites, do not take them off. They will fall off on their own.   The incision site may be sore for 1 to 2 days. Pain medicine can help.   Do not get the incision site wet. Bathe or shower as directed by your caregiver.   The incision site should heal in 5 to 7 days. A small scar may form after the  incision has healed.  ACCESSING THE PORT  Special steps must be taken to access the port:   Before the port is accessed, a numbing cream can be placed on the skin. This helps numb the skin over the port site.   A sterile technique is used to access the port.   The port is accessed with a needle. Only "non-coring" port needles should be used to access the port. Once the port is accessed, a blood return should be checked. This helps ensure the port is in the vein and is not clogged (clotted).   If your caregiver believes your port should remain accessed, a clear (transparent) bandage will be placed over the needle site. The bandage and needle will need to be changed every week or as directed by your caregiver.   Keep the bandage covering the needle clean and dry. Do not get it wet. Follow your caregiver's instructions on how to take a shower or bath when the port is accessed.   If your port does not need to stay accessed, no bandage is needed over the port.  FLUSHING THE PORT  Flushing the port keeps it from getting clogged. How often the port is flushed depends on:   If a   constant infusion is running. If a constant infusion is running, the port may not need to be flushed.   If intermittent medicines are given.   If the port is not being used.  For intermittent medicines:   The port will need to be flushed:   After medicines have been given.   After blood has been drawn.   As part of routine maintenance.   A port is normally flushed with:   Normal saline.   Heparin.   Follow your caregiver's advice on how often, how much, and the type of flush to use on your port.  IMPORTANT PORT INFORMATION   Tell your caregiver if you are allergic to heparin.   After your port is placed, you will get a manufacturer's information card. The card has information about your port. Keep this card with you at all times.   There are many types of ports available. Know what kind of port you have.   In case of an  emergency, it may be helpful to wear a medical alert bracelet. This can help alert health care workers that you have a port.   The port can stay in for as long as your caregiver believes it is necessary.   When it is time for the port to come out, surgery will be done to remove it. The surgery will be similar to how the port was put in.   If you are in the hospital or clinic:   Your port will be taken care of and flushed by a nurse.   If you are at home:   A home health care nurse may give medicines and take care of the port.   You or a family member can get special training and directions for giving medicine and taking care of the port at home.  SEEK IMMEDIATE MEDICAL CARE IF:    Your port does not flush or you are unable to get a blood return.   New drainage or pus is coming from the incision.   A bad smell is coming from the incision site.   You develop swelling or increased redness at the incision site.   You develop increased swelling or pain at the port site.   You develop swelling or pain in the surrounding skin near the port.   You have an oral temperature above 102 F (38.9 C), not controlled by medicine.  MAKE SURE YOU:    Understand these instructions.   Will watch your condition.   Will get help right away if you are not doing well or get worse.  Document Released: 08/12/2005 Document Revised: 08/01/2011 Document Reviewed: 11/03/2008  [employer] Patient Information 2012 [employer].          Moderate Sedation, Adult  Moderate sedation is given to help you relax or even sleep through a procedure. You may remain sleepy, be clumsy, or have poor balance for several hours following this procedure. Arrange for a responsible adult, family member, or friend to take you home. A responsible adult should stay with you for at least 24 hours or until the medicines have worn off.   Do not participate in any activities where you could become injured for the next 24 hours, or until you feel normal  again. Do not:   Drive.   Swim.   Ride a bicycle.   Operate heavy machinery.   Cook.   Use power tools.   Climb ladders.   Work at heights.   Do not   make important decisions or sign legal documents until you are improved.   Vomiting may occur if you eat too soon. When you can drink without vomiting, try water, juice, or soup. Try solid foods if you feel little or no nausea.   Only take over-the-counter or prescription medications for pain, discomfort, or fever as directed by your caregiver.If pain medications have been prescribed for you, ask your caregiver how soon it is safe to take them.   Make sure you and your family fully understands everything about the medication given to you. Make sure you understand what side effects may occur.   You should not drink alcohol, take sleeping pills, or medications that cause drowsiness for at least 24 hours.   If you smoke, do not smoke alone.   If you are feeling better, you may resume normal activities 24 hours after receiving sedation.   Keep all appointments as scheduled. Follow all instructions.   Ask questions if you do not understand.  SEEK MEDICAL CARE IF:    Your skin is pale or bluish in color.   You continue to feel sick to your stomach (nauseous) or throw up (vomit).   Your pain is getting worse and not helped by medication.   You have bleeding or swelling.   You are still sleepy or feeling clumsy after 24 hours.  SEEK IMMEDIATE MEDICAL CARE IF:    You develop a rash.   You have difficulty breathing.   You develop any type of allergic problem.   You have a fever.  Document Released: 05/07/2001 Document Revised: 08/01/2011 Document Reviewed: 09/28/2007  [employer] Patient Information 2012 [employer].

## 2011-11-20 NOTE — H&P (Signed)
Martin Morris is an 78 y.o. male.   Chief Complaint: Hx thymus cancer Non-Hodgkins lymphoma HPI: scheduled for Port a cath placement in IR  Past Medical History  Diagnosis Date  . thymus ca dx'd 2003    Radiation comp 2003  . NHL (non-Hodgkin's lymphoma) dx'd 2008    Chemo comp 10/2010; rituxin comp 10/2010  . Hypertension   . CAD 11/12/2007     (Cath 7/11 Patent LIMA to the LAD. Previously known occlusion of a saphenous vein graft to diagonal and    sequential to obtuse marginals. Severe diffuse native obtuse marginal and diagonal branch vessel disease.    Preserved ejection fraction.)  . HYPERLIPIDEMIA 11/12/2007  . HYPERTENSION 11/12/2007  . PERIPHERAL VASCULAR DISEASE 11/12/2007  . ARTHRITIS 11/12/2007  . BENIGN PROSTATIC HYPERTROPHY, HX OF 11/12/2007  . 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 Edema 10/17/2010  . INGUINAL HERNIAS, BILATERAL 11/10/2006  . MANTLE CELL LYMPHOMA INTRA-ABDOMINAL LYMPH NODES 11/12/2007  . Obesity, unspecified 07/05/2009  . SLEEP APNEA 11/12/2007  . History of PTCA 2005  . Osteoarthritis   . Carotid stenosis, bilateral   . Nephrolithiasis     Past Surgical History  Procedure Date  . Coronary artery bypass graft   . Cardiac catheterization 08/2009, 02/2010    Family History  Problem Relation Age of Onset  . Coronary artery disease Mother     died at 41  . Heart attack Sister     died at age 77  . Heart attack Brother    Social History:  reports that he has never smoked. He does not have any smokeless tobacco history on file. He reports that he does not drink alcohol or use illicit drugs.  Allergies:  Allergies  Allergen Reactions  . Atorvastatin     Medications Prior to Admission  Medication Sig Dispense Refill  . acyclovir (ZOVIRAX) 400 MG tablet Take 1 tablet (400 mg total) by mouth 2 (two) times daily.  60 tablet  6  . amLODipine (NORVASC) 2.5 MG tablet Take 2.5 mg by mouth daily.        Marland Kitchen aspirin 325 MG tablet Take 325 mg by mouth daily.        Marland Kitchen  atenolol (TENORMIN) 50 MG tablet TAKE ONE TABLET BY MOUTH EVERY DAY  30 tablet  6  . citalopram (CELEXA) 20 MG tablet Take 20 mg by mouth daily.        . furosemide (LASIX) 20 MG tablet Take one daily for 3 days and then only as needed  30 tablet  3  . isosorbide mononitrate (IMDUR) 60 MG 24 hr tablet TAKE ONE TABLET BY MOUTH EVERY DAY  30 tablet  6  . nitroGLYCERIN (NITROSTAT) 0.4 MG SL tablet Place 0.4 mg under the tongue every 5 (five) minutes as needed.        Marland Kitchen PLAVIX 75 MG tablet TAKE ONE TABLET BY MOUTH EVERY DAY  30 each  6  . potassium chloride SA (K-DUR,KLOR-CON) 20 MEQ tablet Take 20 mEq by mouth 2 (two) times daily.        . pravastatin (PRAVACHOL) 40 MG tablet TAKE TWO TABLETS (80 MG) BY MOUTH AT BEDTIME  60 tablet  6  . sulfamethoxazole-trimethoprim (BACTRIM DS) 800-160 MG per tablet Take 1 tablet by mouth as directed. Take 1 tab po Monday,Wednesday and Friday  24 tablet  2   Medications Prior to Admission  Medication Dose Route Frequency Provider Last Rate Last Dose  . 0.9 %  sodium chloride  infusion   Intravenous Continuous Abundio Miu, MD      . ceFAZolin (ANCEF) 2-3 GM-% IVPB SOLR           . fentaNYL (SUBLIMAZE) 0.05 MG/ML injection           . fentaNYL (SUBLIMAZE) 0.05 MG/ML injection           . lidocaine (XYLOCAINE) 1 % (with pres) injection           . midazolam (VERSED) 2 MG/2ML injection           . DISCONTD: ceFAZolin (ANCEF) IVPB 1 g/50 mL premix  1 g Intravenous Once Abundio Miu, MD        Results for orders placed during the hospital encounter of 11/20/11 (from the past 48 hour(s))  CBC     Status: Abnormal   Collection Time   11/20/11 12:13 PM      Component Value Range Comment   WBC 5.2  4.0 - 10.5 (K/uL)    RBC 4.06 (*) 4.22 - 5.81 (MIL/uL)    Hemoglobin 12.9 (*) 13.0 - 17.0 (g/dL)    HCT 57.8  46.9 - 62.9 (%)    MCV 97.0  78.0 - 100.0 (fL)    MCH 31.8  26.0 - 34.0 (pg)    MCHC 32.7  30.0 - 36.0 (g/dL)    RDW 52.8  41.3 - 24.4 (%)    Platelets  159  150 - 400 (K/uL)    No results found.  Review of Systems  Constitutional: Negative for fever.  Respiratory: Negative for cough.   Cardiovascular: Negative for chest pain.  Gastrointestinal: Negative for nausea and vomiting.  Neurological: Negative for headaches.    Blood pressure 142/80, pulse 60, temperature 98.6 F (37 C), temperature source Oral, resp. rate 14, SpO2 94.00%. Physical Exam  Constitutional: He is oriented to person, place, and time. He appears well-developed and well-nourished.  HENT:  Head: Normocephalic.  Eyes: EOM are normal.  Neck: Normal range of motion.  Cardiovascular: Normal rate, regular rhythm and normal heart sounds.   No murmur heard. Respiratory: Effort normal and breath sounds normal. He has no wheezes.  GI: Soft. Bowel sounds are normal. There is no tenderness.  Musculoskeletal: Normal range of motion.       Slightly unsteady  Neurological: He is alert and oriented to person, place, and time.     Assessment/Plan NHL; Hx thymus ca Scheduled now for [facility] placement Need IV access Pt aware of procedure benefits and risks and agreeable to proceed. Consent sign  [name] A 11/20/2011, 12:34 PM

## 2011-11-21 ENCOUNTER — Ambulatory Visit (HOSPITAL_BASED_OUTPATIENT_CLINIC_OR_DEPARTMENT_OTHER): Payer: Medicare Other

## 2011-11-21 ENCOUNTER — Other Ambulatory Visit: Payer: Self-pay

## 2011-11-21 ENCOUNTER — Ambulatory Visit (HOSPITAL_BASED_OUTPATIENT_CLINIC_OR_DEPARTMENT_OTHER): Payer: Medicare Other | Admitting: Oncology

## 2011-11-21 ENCOUNTER — Telehealth: Payer: Self-pay | Admitting: Oncology

## 2011-11-21 ENCOUNTER — Other Ambulatory Visit (HOSPITAL_BASED_OUTPATIENT_CLINIC_OR_DEPARTMENT_OTHER): Payer: Medicare Other | Admitting: Lab

## 2011-11-21 ENCOUNTER — Encounter: Payer: Self-pay | Admitting: Oncology

## 2011-11-21 VITALS — BP 155/82 | HR 63 | Temp 97.3°F | Ht 68.0 in | Wt 200.9 lb

## 2011-11-21 DIAGNOSIS — C37 Malignant neoplasm of thymus: Secondary | ICD-10-CM

## 2011-11-21 DIAGNOSIS — Z5111 Encounter for antineoplastic chemotherapy: Secondary | ICD-10-CM

## 2011-11-21 DIAGNOSIS — C8589 Other specified types of non-Hodgkin lymphoma, extranodal and solid organ sites: Secondary | ICD-10-CM

## 2011-11-21 DIAGNOSIS — Z5112 Encounter for antineoplastic immunotherapy: Secondary | ICD-10-CM

## 2011-11-21 DIAGNOSIS — C8590 Non-Hodgkin lymphoma, unspecified, unspecified site: Secondary | ICD-10-CM

## 2011-11-21 DIAGNOSIS — C8313 Mantle cell lymphoma, intra-abdominal lymph nodes: Secondary | ICD-10-CM

## 2011-11-21 LAB — CBC WITH DIFFERENTIAL/PLATELET
Basophils Absolute: 0 10*3/uL (ref 0.0–0.1)
Eosinophils Absolute: 0.3 10*3/uL (ref 0.0–0.5)
HCT: 35.7 % — ABNORMAL LOW (ref 38.4–49.9)
HGB: 11.9 g/dL — ABNORMAL LOW (ref 13.0–17.1)
LYMPH%: 24.1 % (ref 14.0–49.0)
MCV: 95.2 fL (ref 79.3–98.0)
MONO%: 12.7 % (ref 0.0–14.0)
NEUT#: 4.2 10*3/uL (ref 1.5–6.5)
NEUT%: 59.1 % (ref 39.0–75.0)
Platelets: 204 10*3/uL (ref 140–400)

## 2011-11-21 LAB — COMPREHENSIVE METABOLIC PANEL
CO2: 27 mEq/L (ref 19–32)
Creatinine, Ser: 1.01 mg/dL (ref 0.50–1.35)
Glucose, Bld: 87 mg/dL (ref 70–99)
Sodium: 139 mEq/L (ref 135–145)
Total Bilirubin: 0.6 mg/dL (ref 0.3–1.2)
Total Protein: 6.1 g/dL (ref 6.0–8.3)

## 2011-11-21 LAB — LACTATE DEHYDROGENASE: LDH: 162 U/L (ref 94–250)

## 2011-11-21 MED ORDER — BORTEZOMIB CHEMO SQ INJECTION 3.5 MG (2.5MG/ML)
1.3000 mg/m2 | Freq: Once | INTRAMUSCULAR | Status: AC
  Administered 2011-11-21: 2.75 mg via SUBCUTANEOUS
  Filled 2011-11-21: qty 2.75

## 2011-11-21 MED ORDER — SODIUM CHLORIDE 0.9 % IJ SOLN
10.0000 mL | INTRAMUSCULAR | Status: DC | PRN
  Administered 2011-11-21: 10 mL
  Filled 2011-11-21: qty 10

## 2011-11-21 MED ORDER — ONDANSETRON 8 MG/50ML IVPB (CHCC)
8.0000 mg | Freq: Once | INTRAVENOUS | Status: AC
  Administered 2011-11-21: 8 mg via INTRAVENOUS

## 2011-11-21 MED ORDER — HEPARIN SOD (PORK) LOCK FLUSH 100 UNIT/ML IV SOLN
500.0000 [IU] | Freq: Once | INTRAVENOUS | Status: AC | PRN
  Administered 2011-11-21: 500 [IU]
  Filled 2011-11-21: qty 5

## 2011-11-21 MED ORDER — SODIUM CHLORIDE 0.9 % IV SOLN
190.0000 mg/m2 | Freq: Once | INTRAVENOUS | Status: AC
  Administered 2011-11-21: 400 mg via INTRAVENOUS
  Filled 2011-11-21: qty 20

## 2011-11-21 MED ORDER — LIDOCAINE-PRILOCAINE 2.5-2.5 % EX CREA: TOPICAL_CREAM | CUTANEOUS | Status: DC

## 2011-11-21 MED ORDER — SODIUM CHLORIDE 0.9 % IV SOLN
Freq: Once | INTRAVENOUS | Status: AC
  Administered 2011-11-21: 12:00:00 via INTRAVENOUS

## 2011-11-21 MED ORDER — DEXAMETHASONE SODIUM PHOSPHATE 4 MG/ML IJ SOLN
20.0000 mg | Freq: Once | INTRAMUSCULAR | Status: AC
  Administered 2011-11-21: 20 mg via INTRAVENOUS

## 2011-11-21 NOTE — Telephone Encounter (Signed)
gv pt appt schedule for April/may. Per DM he will see pt 4/24.

## 2011-11-21 NOTE — Patient Instructions (Signed)
Patient discharge home with no complaints.

## 2011-11-21 NOTE — Progress Notes (Signed)
This office note has been dictated.  #[id number]

## 2011-11-21 NOTE — Progress Notes (Signed)
CC:   [name], M.D.  PROBLEM LIST:  1. Mantle cell lymphoma diagnosed in March 2008 with positive bone  marrow initially on 12/03/2006 and then negative bone marrow after  treatment in October 2009. As stated, the patient presented with  obstructive liver abnormalities requiring ERCP and non metal stent  placement by Dr. Melvia Heaps. The stent was subsequently removed  in October 2008. The patient was treated with 8 cycles of Rituxan,  Cytoxan, vincristine and Decadron in combination with Neulasta from  12/19/2006 through 05/15/2007. The patient then received  maintenance Rituxan from July 16, 2007 through February 01, 2010.  While on Rituxan, he had a recurrence of his disease by CT scan  carried out on 01/24/2010. Martin Morris then received treatment  with bendamustine and Rituxan in combination with Neulasta for 6  cycles from 02/19/2010 through 07/26/2010. We have a prior PET scan from  08/22/2010 and CT scans of chest, abdomen, and pelvis from 01/01/2011 that showed no evidence of disease. He has been off treatment since December 2011 and had been doing well without any symptoms until the CT scan of the abdomen and pelvis on 07/01/2011 showed signs of recurrence. Most recent CT scans of abdomen and pelvis with IVC on 09/02/11 show further progression.  Rituxan, subcutaneous Velcade, intravenous Cytoxan and Decadron  were initiated on 10/15/2011.  2. Coronary artery disease, S/P 2 MIs, most recently 03/21/10. Had heart cath on 03/22/10. LVEF is 40-50%.  3. Peripheral vascular disease.  4. Dyslipidemia.  5. Hypertension.  6. History of depression.  7. History of kidney stones.  8. History of sleep apnea.  9. History of colonic polyps.  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 Kathlee Nations Trigt, status post radiation treatments.  11.Right Port-A-Cath placed on 11/20/2011.   MEDICATIONS:  1. Norvasc 2.5 mg daily.  2. Aspirin 81 mg  daily. 3. Atenolol 50 mg daily.  4. Celexa 20 mg daily.  5. Lasix 20 mg as needed.  6. Imdur 60 mg daily.  7. Plavix 75 mg daily.  8. K-Dur 20 mEq twice a day.  9. Pravachol 80 mg at bedtime.  10.Nitrostat 0.4 mg sublingually. The patient has not needed to use  this.  11.Acyclovir 400 mg b.i.d. 12.Bactrim DS 800/160 mg 1 tablet on Monday, Wednesdays, and Fridays.  Pneumovax was administered 09/06/2011.  Flu shot was administered early December 2012.    HISTORY:  Martin Morris was seen today for followup and treatment of his relapsed mantle cell lymphoma.  Martin Morris is accompanied by his wife, [name], and was last seen by [place] on 10/30/2011, at which time he received his 2nd course of chemotherapy.  At that time, he received Velcade 2.75 mg subcu, Cytoxan 320 mg IV, and Decadron 20 mg IV.  Since then, Martin Morris has received chemotherapy on 03/14 with the same chemotherapy, in addition to the Rituxan 800 mg IV.  He received chemotherapy on March 20th with Velcade, Cytoxan, and Decadron.  He has tolerated those treatments well and is without any side effects whatsoever, including neuropathy.  He feels well with no symptoms related to his lymphoma.  Because of difficulties with venous access, the patient had a Port-A- Cath placed on the right side yesterday.  He is here today for additional chemotherapy, which he has been receiving on a weekly basis. The Rituxan is being given every 3 weeks and will be due again on April 4th.  PHYSICAL EXAMINATION:  General:  There is little change in his appearance.  Vital Signs:  Weight is currently 200.9 pounds, height 5 feet 8 inches, body surface area 2.09 sq m.  Blood pressure 155/82.  The patient does have a history of hypertension.  Other vital signs are normal.  HEENT:  There is no scleral icterus.  Mouth and pharynx are benign.  Lymph:  No peripheral adenopathy palpable.  Heart and Lungs: Normal.  I did not appreciate a systolic  ejection murmur.  Abdomen: Obese, nontender with no organomegaly palpable.  The previous tumor area that I had felt measuring about 5 x 5 cm located just above the umbilicus in the midline is now virtually gone.  There is a little sensation of some resistance and firmness, but I cannot appreciate the actual mass.  This is improved in my opinion, as well as the patient's opinion.  Extremities:  2+ to 3+ edema of both ankles and lower legs. Neurologic Exam:  Grossly normal.  LABORATORY DATA:  Today white count 7.1, ANC 4.2, hemoglobin 11.9, hematocrit 35.7, platelets 204,000.  Chemistries from today are pending. Chemistries from 10/30/2011 were normal, including an albumin of 3.7, uric acid 7.5, and LDH 163.  IMAGING STUDIES:     IMAGING STUDIES:  1. CT scan of chest, abdomen and pelvis with IV contrast on 01/01/2011  were negative for evidence of recurrent lymphoma.  2. PET scan from 08/22/2010 showed no evidence for recurrent lymphoma.  3. CT scan of chest, abdomen and pelvis with IV contrast on 01/01/2011  showed no evidence for lymphoma.  4. CT scan of abdomen and pelvis on 07/01/2011 showed interval  development of peritoneal disease worrisome for recurrence of  lymphoma. There was new right external iliac lymph node  pathologically enlarged and worrisome for recurrent lymphoma.  There was some stable appearance of mild soft tissue stranding  surrounding the celiac trunk and superior mesenteric artery.  5. Chest x-ray, 2 view, from 09/02/2011 showed no acute findings.  6. CT scan of abdomen and pelvis with IV contrast on 09/02/2011 showed  interval progression of lymphadenopathy in the abdomen and pelvis.  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 The 1.1 x 1.7 cm omental nodule  which was measured on the prior study of 07/01/2011 now measures  4.1 x 2.4 cm. A 2nd discrete soft tissue nodule in the omentum  which previously measured 1.1 x 1.5 cm now measures 2.2 x 3.0 cm.    IMPRESSION  AND PLAN:  Martin Morris is here for another course of chemotherapy, this being cycle #5.  These treatments it will be recalled were started on 02/19.  The patient has tolerated treatments well.  He now has a Port-A-Cath.  He is due for the following chemotherapy today: Velcade 2.75 mg subcu, Cytoxan with the dose increased to 400 mg IV from the previous dose of 320 mg IV, and Decadron 20 mg IV.  Martin Morris will return on a weekly basis and continue with his weekly chemotherapy program.  Rituxan 800 mg will be given on April 4th and 3 weeks later due for April 25th.  At some point, we may want to increase the interval between the Velcade and the Cytoxan to every 2 weeks and perhaps increase the Rituxan to every 4 weeks.  So far Martin Morris has not had any side effects or toxicities from his chemotherapy.  It appears on physical exam that the palpable mass located just above the umbilicus has decreased in size.  At some point, we  will need to repeat the imaging studies that were last carried out on 09/02/2011.  Martin Morris will return for an appointment, lab data, and chemotherapy on or about April 24th or 25th.  At that time, he will be due for Rituxan in combination with subcutaneous Velcade, IV Decadron, and IV Cytoxan.    ______________________________ Samul Dada, M.D. DSM/MEDQ  D:  11/21/2011  T:  11/21/2011  Job:  [id number]

## 2011-11-25 ENCOUNTER — Other Ambulatory Visit: Payer: Self-pay | Admitting: Oncology

## 2011-11-25 ENCOUNTER — Other Ambulatory Visit: Payer: Self-pay | Admitting: Certified Registered Nurse Anesthetist

## 2011-11-27 ENCOUNTER — Other Ambulatory Visit (HOSPITAL_BASED_OUTPATIENT_CLINIC_OR_DEPARTMENT_OTHER): Payer: Medicare Other | Admitting: Lab

## 2011-11-27 ENCOUNTER — Ambulatory Visit (HOSPITAL_BASED_OUTPATIENT_CLINIC_OR_DEPARTMENT_OTHER): Payer: Medicare Other

## 2011-11-27 VITALS — BP 154/79 | HR 61 | Temp 98.5°F

## 2011-11-27 DIAGNOSIS — Z5111 Encounter for antineoplastic chemotherapy: Secondary | ICD-10-CM

## 2011-11-27 DIAGNOSIS — C8313 Mantle cell lymphoma, intra-abdominal lymph nodes: Secondary | ICD-10-CM

## 2011-11-27 DIAGNOSIS — C8590 Non-Hodgkin lymphoma, unspecified, unspecified site: Secondary | ICD-10-CM

## 2011-11-27 LAB — CBC WITH DIFFERENTIAL/PLATELET
BASO%: 0.5 % (ref 0.0–2.0)
HCT: 36.6 % — ABNORMAL LOW (ref 38.4–49.9)
MCHC: 32.5 g/dL (ref 32.0–36.0)
MONO#: 0.8 10*3/uL (ref 0.1–0.9)
NEUT%: 56.8 % (ref 39.0–75.0)
RBC: 3.8 10*6/uL — ABNORMAL LOW (ref 4.20–5.82)
WBC: 6.2 10*3/uL (ref 4.0–10.3)
lymph#: 1.5 10*3/uL (ref 0.9–3.3)

## 2011-11-27 MED ORDER — DEXAMETHASONE SODIUM PHOSPHATE 4 MG/ML IJ SOLN
20.0000 mg | Freq: Once | INTRAMUSCULAR | Status: AC
  Administered 2011-11-27: 20 mg via INTRAVENOUS

## 2011-11-27 MED ORDER — SODIUM CHLORIDE 0.9 % IV SOLN
375.0000 mg/m2 | Freq: Once | INTRAVENOUS | Status: AC
  Administered 2011-11-27: 800 mg via INTRAVENOUS
  Filled 2011-11-27: qty 80

## 2011-11-27 MED ORDER — ACETAMINOPHEN 325 MG PO TABS
650.0000 mg | ORAL_TABLET | Freq: Once | ORAL | Status: AC
  Administered 2011-11-27: 650 mg via ORAL

## 2011-11-27 MED ORDER — SODIUM CHLORIDE 0.9 % IJ SOLN
10.0000 mL | INTRAMUSCULAR | Status: DC | PRN
  Administered 2011-11-27: 10 mL
  Filled 2011-11-27: qty 10

## 2011-11-27 MED ORDER — HEPARIN SOD (PORK) LOCK FLUSH 100 UNIT/ML IV SOLN
500.0000 [IU] | Freq: Once | INTRAVENOUS | Status: AC | PRN
  Administered 2011-11-27: 500 [IU]
  Filled 2011-11-27: qty 5

## 2011-11-27 MED ORDER — DIPHENHYDRAMINE HCL 25 MG PO CAPS
25.0000 mg | ORAL_CAPSULE | Freq: Once | ORAL | Status: AC
  Administered 2011-11-27: 25 mg via ORAL

## 2011-11-27 MED ORDER — SODIUM CHLORIDE 0.9 % IV SOLN
Freq: Once | INTRAVENOUS | Status: AC
  Administered 2011-11-27: 09:00:00 via INTRAVENOUS

## 2011-11-27 MED ORDER — ONDANSETRON 8 MG/50ML IVPB (CHCC)
8.0000 mg | Freq: Once | INTRAVENOUS | Status: AC
  Administered 2011-11-27: 8 mg via INTRAVENOUS

## 2011-11-27 MED ORDER — BORTEZOMIB CHEMO SQ INJECTION 3.5 MG (2.5MG/ML)
1.3000 mg/m2 | Freq: Once | INTRAMUSCULAR | Status: AC
  Administered 2011-11-27: 2.75 mg via SUBCUTANEOUS
  Filled 2011-11-27: qty 2.75

## 2011-11-27 MED ORDER — SODIUM CHLORIDE 0.9 % IV SOLN
190.0000 mg/m2 | Freq: Once | INTRAVENOUS | Status: AC
  Administered 2011-11-27: 400 mg via INTRAVENOUS
  Filled 2011-11-27: qty 20

## 2011-11-27 NOTE — Patient Instructions (Signed)
[facility] Discharge Instructions for Patients Receiving Chemotherapy  Today you received the following chemotherapy agents Rituxan/Velcade/Cytoxan  To help prevent nausea and vomiting after your treatment, we encourage you to take your nausea medication as prescribed  Begin taking it at as needed and take it as often as prescribed for the next   If you develop nausea and vomiting that is not controlled by your nausea medication, call the clinic. If it is after clinic hours your family physician or the after hours number for the clinic or go to the Emergency Department.   BELOW ARE SYMPTOMS THAT SHOULD BE REPORTED IMMEDIATELY:  *FEVER GREATER THAN 101.0 F  *CHILLS WITH OR WITHOUT FEVER  NAUSEA AND VOMITING THAT IS NOT CONTROLLED WITH YOUR NAUSEA MEDICATION  *UNUSUAL SHORTNESS OF BREATH  *UNUSUAL BRUISING OR BLEEDING  TENDERNESS IN MOUTH AND THROAT WITH OR WITHOUT PRESENCE OF ULCERS  *URINARY PROBLEMS  *BOWEL PROBLEMS  UNUSUAL RASH Items with * indicate a potential emergency and should be followed up as soon as possible.  One of the nurses will contact you 24 hours after your treatment. Please let the nurse know about any problems that you may have experienced. Feel free to call the clinic you have any questions or concerns. The clinic phone number is [phone].   I have been informed and understand all the instructions given to me. I know to contact the clinic, my physician, or go to the Emergency Department if any problems should occur. I do not have any questions at this time, but understand that I may call the clinic during office hours or the Patient Navigator at [phone] should I have any questions or need assistance in obtaining follow up care.    __________________________________________  _____________  __________ Signature of Patient or Authorized Representative            Date                    Time    __________________________________________ Nurse's Signature

## 2011-12-02 ENCOUNTER — Other Ambulatory Visit: Payer: Self-pay | Admitting: Certified Registered Nurse Anesthetist

## 2011-12-04 ENCOUNTER — Other Ambulatory Visit (HOSPITAL_BASED_OUTPATIENT_CLINIC_OR_DEPARTMENT_OTHER): Payer: Medicare Other | Admitting: Lab

## 2011-12-04 ENCOUNTER — Ambulatory Visit (HOSPITAL_BASED_OUTPATIENT_CLINIC_OR_DEPARTMENT_OTHER): Payer: Medicare Other

## 2011-12-04 DIAGNOSIS — C8319 Mantle cell lymphoma, extranodal and solid organ sites: Secondary | ICD-10-CM

## 2011-12-04 DIAGNOSIS — C8313 Mantle cell lymphoma, intra-abdominal lymph nodes: Secondary | ICD-10-CM

## 2011-12-04 DIAGNOSIS — Z5112 Encounter for antineoplastic immunotherapy: Secondary | ICD-10-CM

## 2011-12-04 DIAGNOSIS — C8590 Non-Hodgkin lymphoma, unspecified, unspecified site: Secondary | ICD-10-CM

## 2011-12-04 DIAGNOSIS — C8589 Other specified types of non-Hodgkin lymphoma, extranodal and solid organ sites: Secondary | ICD-10-CM

## 2011-12-04 LAB — CBC WITH DIFFERENTIAL/PLATELET
BASO%: 0.3 % (ref 0.0–2.0)
Eosinophils Absolute: 0.3 10*3/uL (ref 0.0–0.5)
HCT: 36.7 % — ABNORMAL LOW (ref 38.4–49.9)
HGB: 12.2 g/dL — ABNORMAL LOW (ref 13.0–17.1)
MCHC: 33.2 g/dL (ref 32.0–36.0)
MONO#: 0.8 10*3/uL (ref 0.1–0.9)
NEUT#: 3.7 10*3/uL (ref 1.5–6.5)
NEUT%: 59 % (ref 39.0–75.0)
WBC: 6.3 10*3/uL (ref 4.0–10.3)
lymph#: 1.5 10*3/uL (ref 0.9–3.3)

## 2011-12-04 MED ORDER — SODIUM CHLORIDE 0.9 % IJ SOLN
10.0000 mL | INTRAMUSCULAR | Status: DC | PRN
  Administered 2011-12-04: 10 mL
  Filled 2011-12-04: qty 10

## 2011-12-04 MED ORDER — DEXAMETHASONE SODIUM PHOSPHATE 4 MG/ML IJ SOLN
20.0000 mg | Freq: Once | INTRAMUSCULAR | Status: AC
  Administered 2011-12-04: 20 mg via INTRAVENOUS

## 2011-12-04 MED ORDER — BORTEZOMIB CHEMO SQ INJECTION 3.5 MG (2.5MG/ML)
1.3000 mg/m2 | Freq: Once | INTRAMUSCULAR | Status: AC
  Administered 2011-12-04: 2.75 mg via SUBCUTANEOUS
  Filled 2011-12-04: qty 2.75

## 2011-12-04 MED ORDER — ONDANSETRON 8 MG/50ML IVPB (CHCC)
8.0000 mg | Freq: Once | INTRAVENOUS | Status: AC
  Administered 2011-12-04: 8 mg via INTRAVENOUS

## 2011-12-04 MED ORDER — SODIUM CHLORIDE 0.9 % IV SOLN
190.0000 mg/m2 | Freq: Once | INTRAVENOUS | Status: AC
  Administered 2011-12-04: 400 mg via INTRAVENOUS
  Filled 2011-12-04: qty 20

## 2011-12-04 MED ORDER — HEPARIN SOD (PORK) LOCK FLUSH 100 UNIT/ML IV SOLN
500.0000 [IU] | Freq: Once | INTRAVENOUS | Status: AC | PRN
  Administered 2011-12-04: 500 [IU]
  Filled 2011-12-04: qty 5

## 2011-12-04 MED ORDER — SODIUM CHLORIDE 0.9 % IV SOLN
Freq: Once | INTRAVENOUS | Status: AC
  Administered 2011-12-04: 11:00:00 via INTRAVENOUS

## 2011-12-11 ENCOUNTER — Ambulatory Visit (HOSPITAL_BASED_OUTPATIENT_CLINIC_OR_DEPARTMENT_OTHER): Payer: Medicare Other

## 2011-12-11 ENCOUNTER — Other Ambulatory Visit (HOSPITAL_BASED_OUTPATIENT_CLINIC_OR_DEPARTMENT_OTHER): Payer: Medicare Other

## 2011-12-11 VITALS — BP 147/78 | HR 70 | Temp 97.6°F

## 2011-12-11 DIAGNOSIS — C8589 Other specified types of non-Hodgkin lymphoma, extranodal and solid organ sites: Secondary | ICD-10-CM

## 2011-12-11 DIAGNOSIS — Z5111 Encounter for antineoplastic chemotherapy: Secondary | ICD-10-CM

## 2011-12-11 DIAGNOSIS — C8313 Mantle cell lymphoma, intra-abdominal lymph nodes: Secondary | ICD-10-CM

## 2011-12-11 DIAGNOSIS — C8590 Non-Hodgkin lymphoma, unspecified, unspecified site: Secondary | ICD-10-CM

## 2011-12-11 LAB — CBC WITH DIFFERENTIAL/PLATELET
Eosinophils Absolute: 0.2 10*3/uL (ref 0.0–0.5)
MONO#: 0.7 10*3/uL (ref 0.1–0.9)
NEUT#: 3.3 10*3/uL (ref 1.5–6.5)
RBC: 3.7 10*6/uL — ABNORMAL LOW (ref 4.20–5.82)
RDW: 15 % — ABNORMAL HIGH (ref 11.0–14.6)
WBC: 6 10*3/uL (ref 4.0–10.3)
nRBC: 0 % (ref 0–0)

## 2011-12-11 MED ORDER — ONDANSETRON 8 MG/50ML IVPB (CHCC)
8.0000 mg | Freq: Once | INTRAVENOUS | Status: AC
  Administered 2011-12-11: 8 mg via INTRAVENOUS

## 2011-12-11 MED ORDER — SODIUM CHLORIDE 0.9 % IV SOLN
Freq: Once | INTRAVENOUS | Status: AC
  Administered 2011-12-11: 10:00:00 via INTRAVENOUS

## 2011-12-11 MED ORDER — DEXAMETHASONE SODIUM PHOSPHATE 4 MG/ML IJ SOLN
20.0000 mg | Freq: Once | INTRAMUSCULAR | Status: AC
Start: 2011-12-11 — End: 2011-12-11
  Administered 2011-12-11: 20 mg via INTRAVENOUS

## 2011-12-11 MED ORDER — HEPARIN SOD (PORK) LOCK FLUSH 100 UNIT/ML IV SOLN
500.0000 [IU] | Freq: Once | INTRAVENOUS | Status: AC | PRN
  Administered 2011-12-11: 500 [IU]
  Filled 2011-12-11: qty 5

## 2011-12-11 MED ORDER — SODIUM CHLORIDE 0.9 % IV SOLN
190.0000 mg/m2 | Freq: Once | INTRAVENOUS | Status: AC
  Administered 2011-12-11: 400 mg via INTRAVENOUS
  Filled 2011-12-11: qty 20

## 2011-12-11 MED ORDER — SODIUM CHLORIDE 0.9 % IJ SOLN
10.0000 mL | INTRAMUSCULAR | Status: DC | PRN
  Administered 2011-12-11: 10 mL
  Filled 2011-12-11: qty 10

## 2011-12-11 MED ORDER — BORTEZOMIB CHEMO SQ INJECTION 3.5 MG (2.5MG/ML)
1.3000 mg/m2 | Freq: Once | INTRAMUSCULAR | Status: AC
  Administered 2011-12-11: 2.75 mg via SUBCUTANEOUS
  Filled 2011-12-11: qty 2.75

## 2011-12-18 ENCOUNTER — Ambulatory Visit (HOSPITAL_BASED_OUTPATIENT_CLINIC_OR_DEPARTMENT_OTHER): Payer: Medicare Other

## 2011-12-18 ENCOUNTER — Other Ambulatory Visit (HOSPITAL_BASED_OUTPATIENT_CLINIC_OR_DEPARTMENT_OTHER): Payer: Medicare Other | Admitting: Lab

## 2011-12-18 ENCOUNTER — Ambulatory Visit: Payer: Medicare Other | Admitting: Oncology

## 2011-12-18 ENCOUNTER — Telehealth: Payer: Self-pay | Admitting: Oncology

## 2011-12-18 ENCOUNTER — Encounter: Payer: Self-pay | Admitting: Oncology

## 2011-12-18 ENCOUNTER — Other Ambulatory Visit: Payer: Medicare Other

## 2011-12-18 VITALS — BP 158/83 | HR 63 | Temp 97.0°F

## 2011-12-18 VITALS — BP 169/87 | HR 66 | Temp 96.8°F | Ht 68.0 in | Wt 206.4 lb

## 2011-12-18 DIAGNOSIS — C8313 Mantle cell lymphoma, intra-abdominal lymph nodes: Secondary | ICD-10-CM

## 2011-12-18 DIAGNOSIS — Z5111 Encounter for antineoplastic chemotherapy: Secondary | ICD-10-CM

## 2011-12-18 DIAGNOSIS — Z5112 Encounter for antineoplastic immunotherapy: Secondary | ICD-10-CM

## 2011-12-18 DIAGNOSIS — C8590 Non-Hodgkin lymphoma, unspecified, unspecified site: Secondary | ICD-10-CM

## 2011-12-18 LAB — CBC WITH DIFFERENTIAL/PLATELET
BASO%: 0.3 % (ref 0.0–2.0)
Basophils Absolute: 0 10*3/uL (ref 0.0–0.1)
EOS%: 3.5 % (ref 0.0–7.0)
HGB: 11.9 g/dL — ABNORMAL LOW (ref 13.0–17.1)
MCH: 32 pg (ref 27.2–33.4)
MCHC: 33.3 g/dL (ref 32.0–36.0)
MCV: 96 fL (ref 79.3–98.0)
MONO%: 14.1 % — ABNORMAL HIGH (ref 0.0–14.0)
NEUT%: 46.1 % (ref 39.0–75.0)
RDW: 15.1 % — ABNORMAL HIGH (ref 11.0–14.6)
lymph#: 2.6 10*3/uL (ref 0.9–3.3)

## 2011-12-18 LAB — COMPREHENSIVE METABOLIC PANEL
AST: 20 U/L (ref 0–37)
Alkaline Phosphatase: 64 U/L (ref 39–117)
BUN: 17 mg/dL (ref 6–23)
Glucose, Bld: 93 mg/dL (ref 70–99)
Sodium: 142 mEq/L (ref 135–145)
Total Bilirubin: 0.5 mg/dL (ref 0.3–1.2)

## 2011-12-18 MED ORDER — SODIUM CHLORIDE 0.9 % IV SOLN
Freq: Once | INTRAVENOUS | Status: AC
  Administered 2011-12-18: 11:00:00 via INTRAVENOUS

## 2011-12-18 MED ORDER — ACETAMINOPHEN 325 MG PO TABS
650.0000 mg | ORAL_TABLET | Freq: Once | ORAL | Status: AC
  Administered 2011-12-18: 650 mg via ORAL

## 2011-12-18 MED ORDER — SODIUM CHLORIDE 0.9 % IV SOLN
375.0000 mg/m2 | Freq: Once | INTRAVENOUS | Status: AC
  Administered 2011-12-18: 800 mg via INTRAVENOUS
  Filled 2011-12-18: qty 80

## 2011-12-18 MED ORDER — HEPARIN SOD (PORK) LOCK FLUSH 100 UNIT/ML IV SOLN
500.0000 [IU] | Freq: Once | INTRAVENOUS | Status: AC | PRN
  Administered 2011-12-18: 500 [IU]
  Filled 2011-12-18: qty 5

## 2011-12-18 MED ORDER — ONDANSETRON 8 MG/50ML IVPB (CHCC)
8.0000 mg | Freq: Once | INTRAVENOUS | Status: AC
  Administered 2011-12-18: 8 mg via INTRAVENOUS

## 2011-12-18 MED ORDER — DEXAMETHASONE SODIUM PHOSPHATE 4 MG/ML IJ SOLN
20.0000 mg | Freq: Once | INTRAMUSCULAR | Status: AC
  Administered 2011-12-18: 20 mg via INTRAVENOUS

## 2011-12-18 MED ORDER — SODIUM CHLORIDE 0.9 % IV SOLN
190.0000 mg/m2 | Freq: Once | INTRAVENOUS | Status: AC
  Administered 2011-12-18: 400 mg via INTRAVENOUS
  Filled 2011-12-18: qty 20

## 2011-12-18 MED ORDER — SODIUM CHLORIDE 0.9 % IJ SOLN
10.0000 mL | INTRAMUSCULAR | Status: DC | PRN
  Administered 2011-12-18: 10 mL
  Filled 2011-12-18: qty 10

## 2011-12-18 MED ORDER — BORTEZOMIB CHEMO SQ INJECTION 3.5 MG (2.5MG/ML)
1.3000 mg/m2 | Freq: Once | INTRAMUSCULAR | Status: AC
  Administered 2011-12-18: 2.75 mg via SUBCUTANEOUS
  Filled 2011-12-18: qty 2.75

## 2011-12-18 MED ORDER — LIDOCAINE-PRILOCAINE 2.5-2.5 % EX CREA
TOPICAL_CREAM | Freq: Once | CUTANEOUS | Status: AC
  Administered 2011-12-18: 10:00:00 via TOPICAL

## 2011-12-18 MED ORDER — DIPHENHYDRAMINE HCL 25 MG PO CAPS
25.0000 mg | ORAL_CAPSULE | Freq: Once | ORAL | Status: AC
  Administered 2011-12-18: 25 mg via ORAL

## 2011-12-18 NOTE — Patient Instructions (Signed)
Pt refused AVS. Aware to call if problems. Will stop by scheduling for next appts.

## 2011-12-18 NOTE — Progress Notes (Signed)
This office note has been dictated.  #[id number]

## 2011-12-18 NOTE — Telephone Encounter (Signed)
appts made and printed for pt  °

## 2011-12-18 NOTE — Progress Notes (Signed)
CC:   [name], M.D.  PROBLEM LIST:  1. Mantle cell lymphoma diagnosed in March 2008 with positive bone  marrow initially on 12/03/2006 and then negative bone marrow after  treatment in October 2009. As stated, the patient presented with  obstructive liver abnormalities requiring ERCP and non metal stent  placement by Dr. Melvia Heaps. The stent was subsequently removed  in October 2008. The patient was treated with 8 cycles of Rituxan,  Cytoxan, vincristine and Decadron in combination with Neulasta from  12/19/2006 through 05/15/2007. The patient then received  maintenance Rituxan from July 16, 2007 through February 01, 2010.  While on Rituxan, he had a recurrence of his disease by CT scan  carried out on 01/24/2010. Mr. Lurz then received treatment  with bendamustine and Rituxan in combination with Neulasta for 6  cycles from 02/19/2010 through 07/26/2010. We have a prior PET scan from  08/22/2010 and CT scans of chest, abdomen, and pelvis from 01/01/2011 that showed no evidence of disease. He has been off treatment since December 2011 and had been doing well without any symptoms until the CT scan of the abdomen and pelvis on 07/01/2011 showed signs of recurrence. Most recent CT scans of abdomen and pelvis with IVC on 09/02/11 show further progression.  Rituxan, subcutaneous Velcade, intravenous Cytoxan and Decadron  were initiated on 10/15/2011.  2. Coronary artery disease, S/P 2 MIs, most recently 03/21/10. Had heart cath on 03/22/10. LVEF is 40-50%.  3. Peripheral vascular disease.  4. Dyslipidemia.  5. Hypertension.  6. History of depression.  7. History of kidney stones.  8. History of sleep apnea.  9. History of colonic polyps.  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 Kathlee Nations Trigt, status post radiation treatments.  11.Right Port-A-Cath placed on 11/20/2011.    MEDICATIONS:  1. Norvasc 2.5 mg daily.  2. Aspirin 81 mg daily.   3. Atenolol 50 mg daily.  4. Celexa 20 mg daily.  5. Lasix 20 mg as needed.  6. Imdur 60 mg daily.  7. Plavix 75 mg daily.  8. K-Dur 20 mEq twice a day.  9. Pravachol 80 mg at bedtime.  10.Nitrostat 0.4 mg sublingually. The patient has not needed to use  this.  11.Acyclovir 400 mg b.i.d.  12.Bactrim DS 800/160 mg 1 tablet on Monday, Wednesdays, and Fridays.   Pneumovax was administered 09/06/2011.  Flu shot was administered early December 2012.     HISTORY:  I saw Martin Morris today for followup and treatment of his relapsed mantle cell lymphoma, with original diagnosis going back to March 2008.  Mr. Reifsteck is accompanied by his wife, [name].  He was last seen by [place] on 11/21/2011.  He received his chemotherapy on 03/28.  He has continued to receive weekly chemotherapy with Velcade 2.75 mg subcutaneously, Cytoxan 400 mg IV, and Decadron 20 mg IV.  Every 3 weeks he receives Rituxan 800 mg IV.  Rituxan was given on April 3rd and prior to that March 14th and prior to that on February 19th.  The patient has tolerated his treatments well without any problems.  He denies any side effects from his treatment, specifically any neuropathy or symptoms related to his lymphoma.  He feels generally well.  He is here today for another course of his weekly chemotherapy.  PHYSICAL EXAMINATION:  There is little change.  Vital Signs:  He is overweight.  Weight is 206.4 pounds, up from 200.9 pounds on 03/28. Height  5 feet 8 inches, body surface area 2.12 sq m.  Blood pressure today 169/87.  Other vital signs are normal.  HEENT:  There is no scleral icterus.  Mouth and pharynx are benign.  Lymph:  There is no peripheral adenopathy palpable.  Heart/Lungs:  Normal.  I did not appreciate a systolic ejection murmur.  The patient has a right-sided Port-A-Cath that was placed on 11/20/2011.  No axillary or inguinal adenopathy.  Abdomen:  Notable for the supraumbilical mass that seems a little bit more  prominent to me today than it did on 03/28 and perhaps less prominent than it had prior to treatment.  Rough measurement is about 3 x 3 cm.  Originally this measured 5 x 5 cm.  However, on 03/28 I could barely perceive any abnormality.  Thus, I am concerned that the mass may be regrowing.  Extremities:  Remain edematous, at least 2+ bilaterally.  No clubbing.  Neurologic Exam:  Normal.  LABORATORY DATA:  Today white count 7.1, ANC 3.3, hemoglobin 11.9, hematocrit 35.7, platelets 163,000.  Chemistries today are pending. Chemistries from on 11/21/2011 were normal.  BUN was 19, creatinine 1.10.  Albumin 3.6, LDH 162.  IMAGING STUDIES:  1. CT scan of chest, abdomen and pelvis with IV contrast on 01/01/2011  were negative for evidence of recurrent lymphoma.  2. PET scan from 08/22/2010 showed no evidence for recurrent lymphoma.  3. CT scan of chest, abdomen and pelvis with IV contrast on 01/01/2011  showed no evidence for lymphoma.  4. CT scan of abdomen and pelvis on 07/01/2011 showed interval  development of peritoneal disease worrisome for recurrence of  lymphoma. There was new right external iliac lymph node  pathologically enlarged and worrisome for recurrent lymphoma.  There was some stable appearance of mild soft tissue stranding  surrounding the celiac trunk and superior mesenteric artery.  5. Chest x-ray, 2 view, from 09/02/2011 showed no acute findings.  6. CT scan of abdomen and pelvis with IV contrast on 09/02/2011 showed  interval progression of lymphadenopathy in the abdomen and pelvis.  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 The 1.1 x 1.7 cm omental nodule  which was measured on the prior study of 07/01/2011 now measures  4.1 x 2.4 cm. A 2nd discrete soft tissue nodule in the omentum  which previously measured 1.1 x 1.5 cm now measures 2.2 x 3.0 cm.   IMPRESSION AND PLAN:  Mr. Niblack clinically seems to be doing well and has tolerated his treatments extremely well.  These  treatments were started on 02/19.  His most recent CT scans of the abdomen and pelvis carried out with IV contrast were on 09/02/2011.  We will continue with weekly CBC and chemotherapy without making any changes in the dose or regimen.  He is due for Rituxan 800 mg IV today and again on May 15th.  Next treatments will be May 1st, May 8th, and May 15th.  We will plan to see Mr. Reimers again on May 22nd, at which time he will have CBC, chemistries, and LDH and he is scheduled for additional chemotherapy, which we are continuing to give weekly.  We will go ahead and obtain a CT scan of the abdomen and pelvis with IV contrast on or about May 15th. If there is evidence of tumor regrowth, then we will need to consider alternative treatments.    ______________________________ Samul Dada, M.D. DSM/MEDQ  D:  12/18/2011  T:  12/18/2011  Job:  [id number]

## 2011-12-25 ENCOUNTER — Other Ambulatory Visit (HOSPITAL_BASED_OUTPATIENT_CLINIC_OR_DEPARTMENT_OTHER): Payer: Medicare Other | Admitting: Lab

## 2011-12-25 ENCOUNTER — Ambulatory Visit (HOSPITAL_BASED_OUTPATIENT_CLINIC_OR_DEPARTMENT_OTHER): Payer: Medicare Other

## 2011-12-25 VITALS — BP 163/84 | HR 63 | Temp 98.3°F

## 2011-12-25 DIAGNOSIS — C8313 Mantle cell lymphoma, intra-abdominal lymph nodes: Secondary | ICD-10-CM

## 2011-12-25 DIAGNOSIS — Z5111 Encounter for antineoplastic chemotherapy: Secondary | ICD-10-CM

## 2011-12-25 DIAGNOSIS — C8589 Other specified types of non-Hodgkin lymphoma, extranodal and solid organ sites: Secondary | ICD-10-CM

## 2011-12-25 DIAGNOSIS — C8590 Non-Hodgkin lymphoma, unspecified, unspecified site: Secondary | ICD-10-CM

## 2011-12-25 LAB — CBC WITH DIFFERENTIAL/PLATELET
BASO%: 0.4 % (ref 0.0–2.0)
Eosinophils Absolute: 0.2 10*3/uL (ref 0.0–0.5)
LYMPH%: 33.8 % (ref 14.0–49.0)
MONO#: 0.8 10*3/uL (ref 0.1–0.9)
NEUT#: 3.5 10*3/uL (ref 1.5–6.5)
Platelets: 166 10*3/uL (ref 140–400)
RBC: 3.86 10*6/uL — ABNORMAL LOW (ref 4.20–5.82)
RDW: 15.6 % — ABNORMAL HIGH (ref 11.0–14.6)
WBC: 6.8 10*3/uL (ref 4.0–10.3)
lymph#: 2.3 10*3/uL (ref 0.9–3.3)

## 2011-12-25 MED ORDER — BORTEZOMIB CHEMO SQ INJECTION 3.5 MG (2.5MG/ML)
1.3000 mg/m2 | Freq: Once | INTRAMUSCULAR | Status: AC
  Administered 2011-12-25: 2.75 mg via SUBCUTANEOUS
  Filled 2011-12-25: qty 2.75

## 2011-12-25 MED ORDER — SODIUM CHLORIDE 0.9 % IJ SOLN
10.0000 mL | INTRAMUSCULAR | Status: DC | PRN
  Administered 2011-12-25: 10 mL
  Filled 2011-12-25: qty 10

## 2011-12-25 MED ORDER — SODIUM CHLORIDE 0.9 % IV SOLN
190.0000 mg/m2 | Freq: Once | INTRAVENOUS | Status: AC
  Administered 2011-12-25: 400 mg via INTRAVENOUS
  Filled 2011-12-25: qty 20

## 2011-12-25 MED ORDER — ONDANSETRON 8 MG/50ML IVPB (CHCC)
8.0000 mg | Freq: Once | INTRAVENOUS | Status: AC
  Administered 2011-12-25: 8 mg via INTRAVENOUS

## 2011-12-25 MED ORDER — DEXAMETHASONE SODIUM PHOSPHATE 4 MG/ML IJ SOLN
20.0000 mg | Freq: Once | INTRAMUSCULAR | Status: AC
  Administered 2011-12-25: 20 mg via INTRAVENOUS

## 2011-12-25 MED ORDER — SODIUM CHLORIDE 0.9 % IV SOLN
Freq: Once | INTRAVENOUS | Status: AC
  Administered 2011-12-25: 10:00:00 via INTRAVENOUS

## 2011-12-25 MED ORDER — HEPARIN SOD (PORK) LOCK FLUSH 100 UNIT/ML IV SOLN
500.0000 [IU] | Freq: Once | INTRAVENOUS | Status: AC | PRN
  Administered 2011-12-25: 500 [IU]
  Filled 2011-12-25: qty 5

## 2011-12-31 ENCOUNTER — Telehealth: Payer: Self-pay | Admitting: Oncology

## 2011-12-31 NOTE — Telephone Encounter (Signed)
l/m that appt had changed to 8:30

## 2012-01-01 ENCOUNTER — Ambulatory Visit (HOSPITAL_BASED_OUTPATIENT_CLINIC_OR_DEPARTMENT_OTHER): Payer: Medicare Other

## 2012-01-01 ENCOUNTER — Other Ambulatory Visit (HOSPITAL_BASED_OUTPATIENT_CLINIC_OR_DEPARTMENT_OTHER): Payer: Medicare Other | Admitting: Lab

## 2012-01-01 DIAGNOSIS — C8313 Mantle cell lymphoma, intra-abdominal lymph nodes: Secondary | ICD-10-CM

## 2012-01-01 DIAGNOSIS — Z5111 Encounter for antineoplastic chemotherapy: Secondary | ICD-10-CM

## 2012-01-01 DIAGNOSIS — C8590 Non-Hodgkin lymphoma, unspecified, unspecified site: Secondary | ICD-10-CM

## 2012-01-01 LAB — CBC WITH DIFFERENTIAL/PLATELET
Eosinophils Absolute: 0.2 10*3/uL (ref 0.0–0.5)
HCT: 36.8 % — ABNORMAL LOW (ref 38.4–49.9)
LYMPH%: 32.3 % (ref 14.0–49.0)
MCV: 97.9 fL (ref 79.3–98.0)
MONO#: 1 10*3/uL — ABNORMAL HIGH (ref 0.1–0.9)
MONO%: 16.2 % — ABNORMAL HIGH (ref 0.0–14.0)
NEUT#: 3 10*3/uL (ref 1.5–6.5)
NEUT%: 47.6 % (ref 39.0–75.0)
Platelets: 150 10*3/uL (ref 140–400)
RBC: 3.76 10*6/uL — ABNORMAL LOW (ref 4.20–5.82)
WBC: 6.4 10*3/uL (ref 4.0–10.3)

## 2012-01-01 MED ORDER — SODIUM CHLORIDE 0.9 % IV SOLN
190.0000 mg/m2 | Freq: Once | INTRAVENOUS | Status: AC
  Administered 2012-01-01: 400 mg via INTRAVENOUS
  Filled 2012-01-01: qty 20

## 2012-01-01 MED ORDER — SODIUM CHLORIDE 0.9 % IJ SOLN
10.0000 mL | INTRAMUSCULAR | Status: DC | PRN
  Filled 2012-01-01: qty 10

## 2012-01-01 MED ORDER — SODIUM CHLORIDE 0.9 % IV SOLN
Freq: Once | INTRAVENOUS | Status: AC
  Administered 2012-01-01: 10:00:00 via INTRAVENOUS

## 2012-01-01 MED ORDER — DEXAMETHASONE SODIUM PHOSPHATE 4 MG/ML IJ SOLN
20.0000 mg | Freq: Once | INTRAMUSCULAR | Status: AC
  Administered 2012-01-01: 20 mg via INTRAVENOUS

## 2012-01-01 MED ORDER — BORTEZOMIB CHEMO SQ INJECTION 3.5 MG (2.5MG/ML)
1.3000 mg/m2 | Freq: Once | INTRAMUSCULAR | Status: AC
  Administered 2012-01-01: 2.75 mg via SUBCUTANEOUS
  Filled 2012-01-01: qty 2.75

## 2012-01-01 MED ORDER — ONDANSETRON 8 MG/50ML IVPB (CHCC)
8.0000 mg | Freq: Once | INTRAVENOUS | Status: AC
  Administered 2012-01-01: 8 mg via INTRAVENOUS

## 2012-01-01 MED ORDER — HEPARIN SOD (PORK) LOCK FLUSH 100 UNIT/ML IV SOLN
500.0000 [IU] | Freq: Once | INTRAVENOUS | Status: DC | PRN
  Filled 2012-01-01: qty 5

## 2012-01-01 NOTE — Patient Instructions (Signed)
[facility] Discharge Instructions for Patients Receiving Chemotherapy  Today you received the following chemotherapy agents vekcad/ cytoxan  To help prevent nausea and vomiting after your treatment, we encourage you to take your nausea medication and take it as often as prescribed for the next {CHL ONC MEDICATION s.   If you develop nausea and vomiting that is not controlled by your nausea medication, call the clinic. If it is after clinic hours your family physician or the after hours number for the clinic or go to the Emergency Department.   BELOW ARE SYMPTOMS THAT SHOULD BE REPORTED IMMEDIATELY:  *FEVER GREATER THAN 100.5 F  *CHILLS WITH OR WITHOUT FEVER  NAUSEA AND VOMITING THAT IS NOT CONTROLLED WITH YOUR NAUSEA MEDICATION  *UNUSUAL SHORTNESS OF BREATH  *UNUSUAL BRUISING OR BLEEDING  TENDERNESS IN MOUTH AND THROAT WITH OR WITHOUT PRESENCE OF ULCERS  *URINARY PROBLEMS  *BOWEL PROBLEMS  UNUSUAL RASH Items with * indicate a potential emergency and should be followed up as soon as possible.  One of the nurses will contact you 24 hours after your treatment. Please let the nurse know about any problems that you may have experienced. Feel free to call the clinic you have any questions or concerns. The clinic phone number is [phone].   I have been informed and understand all the instructions given to me. I know to contact the clinic, my physician, or go to the Emergency Department if any problems should occur. I do not have any questions at this time, but understand that I may call the clinic during office hours   should I have any questions or need assistance in obtaining follow up care.    __________________________________________  _____________  __________ Signature of Patient or Authorized Representative            Date                   Time    __________________________________________ Nurse's Signature

## 2012-01-08 ENCOUNTER — Ambulatory Visit (HOSPITAL_COMMUNITY)
Admission: RE | Admit: 2012-01-08 | Discharge: 2012-01-08 | Disposition: A | Discharge status: Home / Self Care | Payer: Medicare Other | Source: Ambulatory Visit | Attending: Oncology | Admitting: Oncology

## 2012-01-08 ENCOUNTER — Other Ambulatory Visit (HOSPITAL_BASED_OUTPATIENT_CLINIC_OR_DEPARTMENT_OTHER): Payer: Medicare Other | Admitting: Lab

## 2012-01-08 ENCOUNTER — Ambulatory Visit (HOSPITAL_BASED_OUTPATIENT_CLINIC_OR_DEPARTMENT_OTHER): Payer: Medicare Other

## 2012-01-08 VITALS — BP 161/81 | HR 67 | Temp 97.8°F

## 2012-01-08 DIAGNOSIS — C8313 Mantle cell lymphoma, intra-abdominal lymph nodes: Secondary | ICD-10-CM

## 2012-01-08 DIAGNOSIS — K4090 Unilateral inguinal hernia, without obstruction or gangrene, not specified as recurrent: Secondary | ICD-10-CM | POA: Insufficient documentation

## 2012-01-08 DIAGNOSIS — C8589 Other specified types of non-Hodgkin lymphoma, extranodal and solid organ sites: Secondary | ICD-10-CM

## 2012-01-08 DIAGNOSIS — C8590 Non-Hodgkin lymphoma, unspecified, unspecified site: Secondary | ICD-10-CM

## 2012-01-08 DIAGNOSIS — Z5112 Encounter for antineoplastic immunotherapy: Secondary | ICD-10-CM

## 2012-01-08 LAB — CBC WITH DIFFERENTIAL/PLATELET
BASO%: 0.4 % (ref 0.0–2.0)
EOS%: 5 % (ref 0.0–7.0)
LYMPH%: 34 % (ref 14.0–49.0)
MCH: 32.3 pg (ref 27.2–33.4)
MCHC: 33.6 g/dL (ref 32.0–36.0)
MONO#: 1 10*3/uL — ABNORMAL HIGH (ref 0.1–0.9)
MONO%: 18.9 % — ABNORMAL HIGH (ref 0.0–14.0)
Platelets: 159 10*3/uL (ref 140–400)
RBC: 4.02 10*6/uL — ABNORMAL LOW (ref 4.20–5.82)
WBC: 5.4 10*3/uL (ref 4.0–10.3)
nRBC: 0 % (ref 0–0)

## 2012-01-08 MED ORDER — ACETAMINOPHEN 325 MG PO TABS
650.0000 mg | ORAL_TABLET | Freq: Once | ORAL | Status: AC
  Administered 2012-01-08: 650 mg via ORAL

## 2012-01-08 MED ORDER — DEXAMETHASONE SODIUM PHOSPHATE 4 MG/ML IJ SOLN: 20.0000 mg | Freq: Once | INTRAMUSCULAR | Status: DC

## 2012-01-08 MED ORDER — SODIUM CHLORIDE 0.9 % IJ SOLN
10.0000 mL | INTRAMUSCULAR | Status: DC | PRN
  Administered 2012-01-08: 10 mL
  Filled 2012-01-08: qty 10

## 2012-01-08 MED ORDER — IOHEXOL 300 MG/ML  SOLN
100.0000 mL | Freq: Once | INTRAMUSCULAR | Status: AC | PRN
  Administered 2012-01-08: 100 mL via INTRAVENOUS

## 2012-01-08 MED ORDER — ONDANSETRON 8 MG/50ML IVPB (CHCC): 8.0000 mg | Freq: Once | INTRAVENOUS | Status: DC

## 2012-01-08 MED ORDER — HEPARIN SOD (PORK) LOCK FLUSH 100 UNIT/ML IV SOLN
500.0000 [IU] | Freq: Once | INTRAVENOUS | Status: AC | PRN
  Administered 2012-01-08: 500 [IU]
  Filled 2012-01-08: qty 5

## 2012-01-08 MED ORDER — SODIUM CHLORIDE 0.9 % IV SOLN
190.0000 mg/m2 | Freq: Once | INTRAVENOUS | Status: AC
  Administered 2012-01-08: 400 mg via INTRAVENOUS
  Filled 2012-01-08: qty 20

## 2012-01-08 MED ORDER — DIPHENHYDRAMINE HCL 25 MG PO CAPS
25.0000 mg | ORAL_CAPSULE | Freq: Once | ORAL | Status: AC
  Administered 2012-01-08: 25 mg via ORAL

## 2012-01-08 MED ORDER — SODIUM CHLORIDE 0.9 % IV SOLN
Freq: Once | INTRAVENOUS | Status: AC
  Administered 2012-01-08: 10:00:00 via INTRAVENOUS

## 2012-01-08 MED ORDER — SODIUM CHLORIDE 0.9 % IV SOLN
375.0000 mg/m2 | Freq: Once | INTRAVENOUS | Status: AC
  Administered 2012-01-08: 800 mg via INTRAVENOUS
  Filled 2012-01-08: qty 80

## 2012-01-08 MED ORDER — BORTEZOMIB CHEMO SQ INJECTION 3.5 MG (2.5MG/ML)
1.3000 mg/m2 | Freq: Once | INTRAMUSCULAR | Status: AC
  Administered 2012-01-08: 2.75 mg via SUBCUTANEOUS
  Filled 2012-01-08: qty 2.75

## 2012-01-08 NOTE — Patient Instructions (Signed)
[facility] Discharge Instructions for Patients Receiving Chemotherapy  Today you received the following chemotherapy agents rituxan, cytoxan and velcade  To help prevent nausea and vomiting after your treatment, we encourage you to take your nausea medication. Begin taking it tonight and take it as often as prescribed for the next 24-72  hours.   If you develop nausea and vomiting that is not controlled by your nausea medication, call the clinic. If it is after clinic hours your family physician or the after hours number for the clinic or go to the Emergency Department.   BELOW ARE SYMPTOMS THAT SHOULD BE REPORTED IMMEDIATELY:  *FEVER GREATER THAN 100.5 F  *CHILLS WITH OR WITHOUT FEVER  NAUSEA AND VOMITING THAT IS NOT CONTROLLED WITH YOUR NAUSEA MEDICATION  *UNUSUAL SHORTNESS OF BREATH  *UNUSUAL BRUISING OR BLEEDING  TENDERNESS IN MOUTH AND THROAT WITH OR WITHOUT PRESENCE OF ULCERS  *URINARY PROBLEMS  *BOWEL PROBLEMS  UNUSUAL RASH Items with * indicate a potential emergency and should be followed up as soon as possible.  . Feel free to call the clinic you have any questions or concerns. The clinic phone number is [phone].   I have been informed and understand all the instructions given to me. I know to contact the clinic, my physician, or go to the Emergency Department if any problems should occur. I do not have any questions at this time, but understand that I may call the clinic during office hours   should I have any questions or need assistance in obtaining follow up care.    __________________________________________  _____________  __________ Signature of Patient or Authorized Representative            Date                   Time    __________________________________________ Nurse's Signature

## 2012-01-08 NOTE — Progress Notes (Signed)
Quick Note:  Please notify patient and call/fax these results to patient's doctors. ______ 

## 2012-01-13 ENCOUNTER — Other Ambulatory Visit: Payer: Self-pay | Admitting: Oncology

## 2012-01-13 DIAGNOSIS — C8589 Other specified types of non-Hodgkin lymphoma, extranodal and solid organ sites: Secondary | ICD-10-CM

## 2012-01-13 DIAGNOSIS — C8313 Mantle cell lymphoma, intra-abdominal lymph nodes: Secondary | ICD-10-CM

## 2012-01-15 ENCOUNTER — Telehealth: Payer: Self-pay | Admitting: Oncology

## 2012-01-15 ENCOUNTER — Other Ambulatory Visit (HOSPITAL_BASED_OUTPATIENT_CLINIC_OR_DEPARTMENT_OTHER): Payer: Medicare Other | Admitting: Lab

## 2012-01-15 ENCOUNTER — Encounter: Payer: Self-pay | Admitting: Oncology

## 2012-01-15 ENCOUNTER — Ambulatory Visit (HOSPITAL_BASED_OUTPATIENT_CLINIC_OR_DEPARTMENT_OTHER): Payer: Medicare Other | Admitting: Oncology

## 2012-01-15 ENCOUNTER — Ambulatory Visit: Payer: Medicare Other

## 2012-01-15 VITALS — BP 160/83 | HR 61 | Temp 97.8°F | Ht 68.0 in | Wt 204.4 lb

## 2012-01-15 DIAGNOSIS — C8313 Mantle cell lymphoma, intra-abdominal lymph nodes: Secondary | ICD-10-CM

## 2012-01-15 DIAGNOSIS — C8589 Other specified types of non-Hodgkin lymphoma, extranodal and solid organ sites: Secondary | ICD-10-CM

## 2012-01-15 DIAGNOSIS — C8590 Non-Hodgkin lymphoma, unspecified, unspecified site: Secondary | ICD-10-CM

## 2012-01-15 DIAGNOSIS — D702 Other drug-induced agranulocytosis: Secondary | ICD-10-CM

## 2012-01-15 LAB — CBC WITH DIFFERENTIAL/PLATELET
BASO%: 0.5 % (ref 0.0–2.0)
EOS%: 4.8 % (ref 0.0–7.0)
HCT: 35.8 % — ABNORMAL LOW (ref 38.4–49.9)
LYMPH%: 41.5 % (ref 14.0–49.0)
MCH: 33 pg (ref 27.2–33.4)
MCHC: 34.4 g/dL (ref 32.0–36.0)
MONO#: 1 10*3/uL — ABNORMAL HIGH (ref 0.1–0.9)
MONO%: 23.9 % — ABNORMAL HIGH (ref 0.0–14.0)
NEUT%: 29.3 % — ABNORMAL LOW (ref 39.0–75.0)
Platelets: 151 10*3/uL (ref 140–400)
RBC: 3.73 10*6/uL — ABNORMAL LOW (ref 4.20–5.82)
WBC: 4.4 10*3/uL (ref 4.0–10.3)
nRBC: 0 % (ref 0–0)

## 2012-01-15 NOTE — Telephone Encounter (Signed)
Gave pt appt for may, June and July 2013 lab chemo and MD

## 2012-01-15 NOTE — Progress Notes (Signed)
This office note has been dictated.  #[id number]

## 2012-01-15 NOTE — Progress Notes (Signed)
CC:   Martin Morris, M.D.  PROBLEM LIST:  1. Mantle cell lymphoma diagnosed in March 2008 with positive bone  marrow initially on 12/03/2006 and then negative bone marrow after  treatment in October 2009. As stated, the patient presented with  obstructive liver abnormalities requiring ERCP and non metal stent  placement by Dr. Melvia Heaps. The stent was subsequently removed  in October 2008. The patient was treated with 8 cycles of Rituxan,  Cytoxan, vincristine and Decadron in combination with Neulasta from  12/19/2006 through 05/15/2007. The patient then received  maintenance Rituxan from July 16, 2007 through February 01, 2010.  While on Rituxan, he had a recurrence of his disease by CT scan  carried out on 01/24/2010. Martin Morris then received treatment  with bendamustine and Rituxan in combination with Neulasta for 6  cycles from 02/19/2010 through 07/26/2010. We have a prior PET scan from  08/22/2010 and CT scans of chest, abdomen, and pelvis from 01/01/2011 that showed no evidence of disease. He has been off treatment since December 2011 and had been doing well without any symptoms until the CT scan of the abdomen and pelvis on 07/01/2011 showed signs of recurrence. CT scans of abdomen and pelvis with IVC on 09/02/11 show further progression.  Rituxan, subcutaneous Velcade, intravenous Cytoxan and Decadron  were initiated on 10/15/2011.  CT scans of the abdomen and pelvis carried out on 01/08/2012 showed a near complete response to therapy.  2. Coronary artery disease, S/P 2 MIs, most recently 03/21/10. Had heart cath on 03/22/10. LVEF is 40-50%.  3. Peripheral vascular disease.  4. Dyslipidemia.  5. Hypertension.  6. History of depression.  7. History of kidney stones.  8. History of sleep apnea.  9. History of colonic polyps.  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 Kathlee Nations Trigt, status post radiation treatments.  11.Right  Port-A-Cath placed on 11/20/2011.   MEDICATIONS:  1. Norvasc 2.5 mg daily.  2. Aspirin 81 mg daily.  3. Atenolol 50 mg daily.  4. Celexa 20 mg daily.  5. Lasix 20 mg as needed.  6. Imdur 60 mg daily.  7. Plavix 75 mg daily.  8. K-Dur 20 mEq twice a day.  9. Pravachol 80 mg at bedtime.  10.Nitrostat 0.4 mg sublingually. The patient has not needed to use  this.  11.Acyclovir 400 mg b.i.d.  12.Bactrim DS 800/160 mg 1 tablet on Monday, Wednesdays, and Fridays.   Pneumovax was administered 09/06/2011.  Flu shot was administered early December 2012.    HISTORY:  Martin Morris was seen today for followup of his relapsed mantle cell lymphoma with original diagnosis going back to March 2008. Martin Morris was accompanied by his wife Martin Morris.  He was last seen by [place] on 12/18/2011.  He has continued with weekly chemotherapy consisting of subcutaneous Velcade, IV Cytoxan and Decadron.  Every 3 weeks he receives Rituxan 800 mg IV.  Rituxan was most recently administered on 05/15 and before that on April 24th.  Martin Morris tolerates his treatment well without any side effects whatsoever.  He denies any nausea, vomiting, or neuropathy.  He has not had any hypersensitivity reaction to the Rituxan.  His only new issue today is some discomfort in his left CVA area ongoing for the past week. This is not associated with any trauma.  The patient denies any prior problems.  This is described as an aching mild discomfort.  Of note is the fact that he  does have a nonobstructing left renal calculus seen on CT scan.  PHYSICAL EXAM:  There is little change.  Weight is 204.4 pounds, height 5 feet 8 inches, body surface area 2.11 sq/m.  Blood pressure 160/83. Other vital signs are normal.  There is no scleral icterus.  Mouth and pharynx:  Benign.  There is no peripheral adenopathy palpable.  Lungs: Clear.  Cardiac:  Regular rhythm.  I did not appreciate a systolic ejection murmur.  There is a right-sided  Port-A-Cath that had been placed on 11/20/2011.  No axillary or inguinal adenopathy.  Abdomen: Somewhat obese, nontender with no organomegaly or masses palpable.  I could not appreciate the supraumbilical mass that I felt before. Extremities:  Remain with considerable swelling as previously described 2-3+ bilaterally.  No clubbing.  Neurologic:  Grossly normal.  LABORATORY DATA:  Today, white count 4.4, ANC 1.3, hemoglobin 12.3, hematocrit 35.8, platelets 151,000.  On 01/08/2012 white count was 5.4 and ANC 2.3.  Chemistries from 12/18/2011 were entirely normal. Chemistries and LDH that were scheduled to be drawn today will be drawn next week on 05/29 because the patient will not be receiving chemotherapy today in view of his low ANC.  IMAGING STUDIES:  1. CT scan of chest, abdomen and pelvis with IV contrast on 01/01/2011  were negative for evidence of recurrent lymphoma.  2. PET scan from 08/22/2010 showed no evidence for recurrent lymphoma.  3. CT scan of chest, abdomen and pelvis with IV contrast on 01/01/2011  showed no evidence for lymphoma.  4. CT scan of abdomen and pelvis on 07/01/2011 showed interval  development of peritoneal disease worrisome for recurrence of  lymphoma. There was new right external iliac lymph node  pathologically enlarged and worrisome for recurrent lymphoma.  There was some stable appearance of mild soft tissue stranding  surrounding the celiac trunk and superior mesenteric artery.  5. Chest x-ray, 2 view, from 09/02/2011 showed no acute findings.  6. CT scan of abdomen and pelvis with IV contrast on 09/02/2011 showed  interval progression of lymphadenopathy in the abdomen and pelvis.  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 The 1.1 x 1.7 cm omental nodule  which was measured on the prior study of 07/01/2011 now measures  4.1 x 2.4 cm. A 2nd discrete soft tissue nodule in the omentum  which previously measured 1.1 x 1.5 cm now measures 2.2 x 3.0 cm.  7.  CT scan  of abdomen and pelvis with IV contrast on 01/08/2012 showed no residual measurable mass along the anterior aspect of the right diaphragm at the site of the previously demonstrated 4.9 x 2.1 cm nodule.  There is no residual measurable gastrohepatic or celiac lymphadenopathy.  There is some soft tissue stranding around the proximal abdominal aorta, celiac trunk and superior mesenteric artery as noted on images 27-34.  Pelvic lymphadenopathy is nearly completely resolved.  A right external iliac node measuring 6 mm on image 66 previously measured 9 mm.  Another node which previously measured 17 mm short axis now measures 8 mm on image 70.  There is no residual inguinal adenopathy.  The spleen remains normal in size, demonstrates no focal abnormalities.  There are no suspect osseous findings.  There is no residual mass at the left posterior costophrenic angle.  The final impression was near complete response to therapy in the lymphadenopathy and omental disease with some minimal residual measurable disease as previously noted.  There was an enlarged left inguinal hernia containing a knuckle of sigmoid colon with no  evidence for incarceration or bowel obstruction.  There was a nonobstructing left renal calculus.  IMPRESSION AND PLAN:  Martin Morris clinically continues to do well.  His only new issue is some mild left CVA discomfort of uncertain etiology. His CT scan from 01/08/2012 was reassuring.  The pain could be coming from the left renal calculus.  Because of the ANC of 1.3 today, we will skip chemotherapy.  The patient will return weekly beginning next week May 29 for CBC and ongoing chemotherapy as we have been doing, specifically subcutaneous Velcade 2.75 mg, IV Cytoxan 400 mg and IV Decadron 20 mg.  Martin Morris will be due for Rituxan 800 mg on June 5th.  He will have CBCs every week.  He will return on June 12th for chemotherapy.  We will plan to see him again on June 19th at which  time we will check CBC, chemistries, and LDH.  He will be due for more Rituxan on June 26th along with his chemotherapy.  We will not draw chemistries and LDH today.  Instead those will be drawn next week on May 29th.  It was explained to the patient and his wife Pam that we will continue with the current chemotherapy as long as it seems to be holding his disease in check.  If his disease becomes refractory to the current treatment program, then we will have to consider other options for treatment.    ______________________________ Samul Dada, M.D. DSM/MEDQ  D:  01/15/2012  T:  01/15/2012  Job:  [id number]

## 2012-01-22 ENCOUNTER — Other Ambulatory Visit (HOSPITAL_BASED_OUTPATIENT_CLINIC_OR_DEPARTMENT_OTHER): Payer: Medicare Other | Admitting: Lab

## 2012-01-22 ENCOUNTER — Encounter: Payer: Self-pay | Admitting: Oncology

## 2012-01-22 ENCOUNTER — Ambulatory Visit: Payer: Medicare Other

## 2012-01-22 DIAGNOSIS — C8313 Mantle cell lymphoma, intra-abdominal lymph nodes: Secondary | ICD-10-CM

## 2012-01-22 LAB — CBC WITH DIFFERENTIAL/PLATELET
BASO%: 0.7 % (ref 0.0–2.0)
Basophils Absolute: 0 10*3/uL (ref 0.0–0.1)
EOS%: 4.4 % (ref 0.0–7.0)
HCT: 35.6 % — ABNORMAL LOW (ref 38.4–49.9)
HGB: 11.8 g/dL — ABNORMAL LOW (ref 13.0–17.1)
LYMPH%: 46.5 % (ref 14.0–49.0)
MCH: 32.2 pg (ref 27.2–33.4)
MCHC: 33.1 g/dL (ref 32.0–36.0)
MONO#: 1.3 10*3/uL — ABNORMAL HIGH (ref 0.1–0.9)
NEUT%: 18.5 % — ABNORMAL LOW (ref 39.0–75.0)
Platelets: 250 10*3/uL (ref 140–400)

## 2012-01-22 NOTE — Progress Notes (Signed)
The patient has been receiving weekly chemotherapy with Velcade Cytoxan and Decadron. Today his ANC was 0.8. Chemotherapy was held a week ago and will be held today. The patient will return in one week with another CBC and chemotherapy if blood counts are acceptable.

## 2012-01-29 ENCOUNTER — Ambulatory Visit (HOSPITAL_BASED_OUTPATIENT_CLINIC_OR_DEPARTMENT_OTHER): Payer: Medicare Other

## 2012-01-29 ENCOUNTER — Other Ambulatory Visit: Payer: Self-pay | Admitting: Oncology

## 2012-01-29 ENCOUNTER — Other Ambulatory Visit (HOSPITAL_BASED_OUTPATIENT_CLINIC_OR_DEPARTMENT_OTHER): Payer: Medicare Other | Admitting: Lab

## 2012-01-29 VITALS — BP 176/87 | HR 60 | Temp 97.8°F

## 2012-01-29 DIAGNOSIS — C8313 Mantle cell lymphoma, intra-abdominal lymph nodes: Secondary | ICD-10-CM

## 2012-01-29 DIAGNOSIS — Z5112 Encounter for antineoplastic immunotherapy: Secondary | ICD-10-CM

## 2012-01-29 DIAGNOSIS — Z5111 Encounter for antineoplastic chemotherapy: Secondary | ICD-10-CM

## 2012-01-29 LAB — CBC WITH DIFFERENTIAL/PLATELET
Basophils Absolute: 0 10*3/uL (ref 0.0–0.1)
EOS%: 3.8 % (ref 0.0–7.0)
Eosinophils Absolute: 0.3 10*3/uL (ref 0.0–0.5)
HGB: 13.4 g/dL (ref 13.0–17.1)
LYMPH%: 33.1 % (ref 14.0–49.0)
MCH: 32.6 pg (ref 27.2–33.4)
MCV: 96.1 fL (ref 79.3–98.0)
MONO%: 14.1 % — ABNORMAL HIGH (ref 0.0–14.0)
Platelets: 181 10*3/uL (ref 140–400)
RDW: 14.3 % (ref 11.0–14.6)

## 2012-01-29 MED ORDER — SODIUM CHLORIDE 0.9 % IJ SOLN
10.0000 mL | INTRAMUSCULAR | Status: DC | PRN
  Administered 2012-01-29: 10 mL
  Filled 2012-01-29: qty 10

## 2012-01-29 MED ORDER — DEXAMETHASONE SODIUM PHOSPHATE 4 MG/ML IJ SOLN
20.0000 mg | Freq: Once | INTRAMUSCULAR | Status: AC
  Administered 2012-01-29: 20 mg via INTRAVENOUS

## 2012-01-29 MED ORDER — SODIUM CHLORIDE 0.9 % IV SOLN
Freq: Once | INTRAVENOUS | Status: AC
  Administered 2012-01-29: 10:00:00 via INTRAVENOUS

## 2012-01-29 MED ORDER — ACETAMINOPHEN 325 MG PO TABS
650.0000 mg | ORAL_TABLET | Freq: Once | ORAL | Status: AC
  Administered 2012-01-29: 650 mg via ORAL

## 2012-01-29 MED ORDER — ONDANSETRON 8 MG/50ML IVPB (CHCC)
8.0000 mg | Freq: Once | INTRAVENOUS | Status: AC
  Administered 2012-01-29: 8 mg via INTRAVENOUS

## 2012-01-29 MED ORDER — DIPHENHYDRAMINE HCL 25 MG PO CAPS
25.0000 mg | ORAL_CAPSULE | Freq: Once | ORAL | Status: AC
  Administered 2012-01-29: 25 mg via ORAL

## 2012-01-29 MED ORDER — BORTEZOMIB CHEMO SQ INJECTION 3.5 MG (2.5MG/ML)
1.3000 mg/m2 | Freq: Once | INTRAMUSCULAR | Status: AC
  Administered 2012-01-29: 2.75 mg via SUBCUTANEOUS
  Filled 2012-01-29: qty 2.75

## 2012-01-29 MED ORDER — SODIUM CHLORIDE 0.9 % IV SOLN
375.0000 mg/m2 | Freq: Once | INTRAVENOUS | Status: AC
  Administered 2012-01-29: 800 mg via INTRAVENOUS
  Filled 2012-01-29: qty 80

## 2012-01-29 MED ORDER — HEPARIN SOD (PORK) LOCK FLUSH 100 UNIT/ML IV SOLN
500.0000 [IU] | Freq: Once | INTRAVENOUS | Status: AC | PRN
  Administered 2012-01-29: 500 [IU]
  Filled 2012-01-29: qty 5

## 2012-01-29 MED ORDER — SODIUM CHLORIDE 0.9 % IV SOLN
190.0000 mg/m2 | Freq: Once | INTRAVENOUS | Status: AC
  Administered 2012-01-29: 400 mg via INTRAVENOUS
  Filled 2012-01-29: qty 20

## 2012-01-29 NOTE — Patient Instructions (Signed)
 [facility] Discharge Instructions for Patients Receiving Chemotherapy  Today you received the following chemotherapy agents RITUXAN, Velcade SQ, & CYTOXAN To help prevent nausea and vomiting after your treatment, we encourage you to take your nausea medication as directed by your provider.  If you develop nausea and vomiting that is not controlled by your nausea medication, call the clinic. If it is after clinic hours your family physician or the after hours number for the clinic or go to the Emergency Department.   BELOW ARE SYMPTOMS THAT SHOULD BE REPORTED IMMEDIATELY:  *FEVER GREATER THAN 100.5 F  *CHILLS WITH OR WITHOUT FEVER  NAUSEA AND VOMITING THAT IS NOT CONTROLLED WITH YOUR NAUSEA MEDICATION  *UNUSUAL SHORTNESS OF BREATH  *UNUSUAL BRUISING OR BLEEDING  TENDERNESS IN MOUTH AND THROAT WITH OR WITHOUT PRESENCE OF ULCERS  *URINARY PROBLEMS  *BOWEL PROBLEMS  UNUSUAL RASH Items with * indicate a potential emergency and should be followed up as soon as possible.  One of the nurses will contact you 24 hours after your treatment. Please let the nurse know about any problems that you may have experienced. Feel free to call the clinic you have any questions or concerns. The clinic phone number is [phone].   I have been informed and understand all the instructions given to me. I know to contact the clinic, my physician, or go to the Emergency Department if any problems should occur. I do not have any questions at this time, but understand that I may call the clinic during office hours   should I have any questions or need assistance in obtaining follow up care.    __________________________________________  _____________  __________ Signature of Patient or Authorized Representative            Date                   Time    __________________________________________ Nurse's Signature

## 2012-02-04 ENCOUNTER — Other Ambulatory Visit: Payer: Self-pay | Admitting: Certified Registered Nurse Anesthetist

## 2012-02-05 ENCOUNTER — Ambulatory Visit (HOSPITAL_BASED_OUTPATIENT_CLINIC_OR_DEPARTMENT_OTHER): Payer: Medicare Other

## 2012-02-05 ENCOUNTER — Other Ambulatory Visit (HOSPITAL_BASED_OUTPATIENT_CLINIC_OR_DEPARTMENT_OTHER): Payer: Medicare Other

## 2012-02-05 VITALS — BP 139/75 | HR 63 | Temp 98.0°F

## 2012-02-05 DIAGNOSIS — Z5112 Encounter for antineoplastic immunotherapy: Secondary | ICD-10-CM

## 2012-02-05 DIAGNOSIS — C8313 Mantle cell lymphoma, intra-abdominal lymph nodes: Secondary | ICD-10-CM

## 2012-02-05 LAB — CBC WITH DIFFERENTIAL/PLATELET
BASO%: 0.3 % (ref 0.0–2.0)
Basophils Absolute: 0 10*3/uL (ref 0.0–0.1)
Eosinophils Absolute: 0.3 10*3/uL (ref 0.0–0.5)
HCT: 38.3 % — ABNORMAL LOW (ref 38.4–49.9)
HGB: 12.9 g/dL — ABNORMAL LOW (ref 13.0–17.1)
LYMPH%: 22.1 % (ref 14.0–49.0)
MONO#: 0.8 10*3/uL (ref 0.1–0.9)
NEUT%: 63.9 % (ref 39.0–75.0)
Platelets: 124 10*3/uL — ABNORMAL LOW (ref 140–400)
WBC: 7.9 10*3/uL (ref 4.0–10.3)
lymph#: 1.8 10*3/uL (ref 0.9–3.3)

## 2012-02-05 MED ORDER — SODIUM CHLORIDE 0.9 % IV SOLN
Freq: Once | INTRAVENOUS | Status: AC
  Administered 2012-02-05: 11:00:00 via INTRAVENOUS

## 2012-02-05 MED ORDER — BORTEZOMIB CHEMO SQ INJECTION 3.5 MG (2.5MG/ML)
1.3000 mg/m2 | Freq: Once | INTRAMUSCULAR | Status: AC
Start: 2012-02-05 — End: 2012-02-05
  Administered 2012-02-05: 2.75 mg via SUBCUTANEOUS
  Filled 2012-02-05: qty 2.75

## 2012-02-05 MED ORDER — HEPARIN SOD (PORK) LOCK FLUSH 100 UNIT/ML IV SOLN
500.0000 [IU] | Freq: Once | INTRAVENOUS | Status: AC | PRN
  Administered 2012-02-05: 500 [IU]
  Filled 2012-02-05: qty 5

## 2012-02-05 MED ORDER — ONDANSETRON 8 MG/50ML IVPB (CHCC)
8.0000 mg | Freq: Once | INTRAVENOUS | Status: AC
  Administered 2012-02-05: 8 mg via INTRAVENOUS

## 2012-02-05 MED ORDER — DEXAMETHASONE SODIUM PHOSPHATE 4 MG/ML IJ SOLN
20.0000 mg | Freq: Once | INTRAMUSCULAR | Status: AC
  Administered 2012-02-05: 20 mg via INTRAVENOUS

## 2012-02-05 MED ORDER — SODIUM CHLORIDE 0.9 % IJ SOLN
10.0000 mL | INTRAMUSCULAR | Status: DC | PRN
  Administered 2012-02-05: 10 mL
  Filled 2012-02-05: qty 10

## 2012-02-05 MED ORDER — SODIUM CHLORIDE 0.9 % IV SOLN
190.0000 mg/m2 | Freq: Once | INTRAVENOUS | Status: AC
  Administered 2012-02-05: 400 mg via INTRAVENOUS
  Filled 2012-02-05: qty 20

## 2012-02-05 NOTE — Patient Instructions (Signed)
[facility] Discharge Instructions for Patients Receiving Chemotherapy  Today you received the following chemotherapy agents Cytoxan and Velcade.  To help prevent nausea and vomiting after your treatment, we encourage you to take your nausea medication.   If you develop nausea and vomiting that is not controlled by your nausea medication, call the clinic. If it is after clinic hours your family physician or the after hours number for the clinic or go to the Emergency Department.   BELOW ARE SYMPTOMS THAT SHOULD BE REPORTED IMMEDIATELY:  *FEVER GREATER THAN 100.5 F  *CHILLS WITH OR WITHOUT FEVER  NAUSEA AND VOMITING THAT IS NOT CONTROLLED WITH YOUR NAUSEA MEDICATION  *UNUSUAL SHORTNESS OF BREATH  *UNUSUAL BRUISING OR BLEEDING  TENDERNESS IN MOUTH AND THROAT WITH OR WITHOUT PRESENCE OF ULCERS  *URINARY PROBLEMS  *BOWEL PROBLEMS  UNUSUAL RASH Items with * indicate a potential emergency and should be followed up as soon as possible.  One of the nurses will contact you 24 hours after your treatment. Please let the nurse know about any problems that you may have experienced. Feel free to call the clinic you have any questions or concerns. The clinic phone number is [phone].   I have been informed and understand all the instructions given to me. I know to contact the clinic, my physician, or go to the Emergency Department if any problems should occur. I do not have any questions at this time, but understand that I may call the clinic during office hours   should I have any questions or need assistance in obtaining follow up care.    __________________________________________  _____________  __________ Signature of Patient or Authorized Representative            Date                   Time    __________________________________________ Nurse's Signature    

## 2012-02-12 ENCOUNTER — Ambulatory Visit (HOSPITAL_BASED_OUTPATIENT_CLINIC_OR_DEPARTMENT_OTHER): Payer: Medicare Other | Admitting: Oncology

## 2012-02-12 ENCOUNTER — Other Ambulatory Visit (HOSPITAL_BASED_OUTPATIENT_CLINIC_OR_DEPARTMENT_OTHER): Payer: Medicare Other | Admitting: Lab

## 2012-02-12 ENCOUNTER — Telehealth: Payer: Self-pay | Admitting: Oncology

## 2012-02-12 ENCOUNTER — Ambulatory Visit: Payer: Medicare Other | Admitting: Lab

## 2012-02-12 ENCOUNTER — Encounter: Payer: Self-pay | Admitting: Oncology

## 2012-02-12 ENCOUNTER — Ambulatory Visit (HOSPITAL_BASED_OUTPATIENT_CLINIC_OR_DEPARTMENT_OTHER): Payer: Medicare Other

## 2012-02-12 VITALS — BP 137/71 | HR 65 | Temp 97.7°F | Ht 68.0 in | Wt 207.2 lb

## 2012-02-12 DIAGNOSIS — T148XXA Other injury of unspecified body region, initial encounter: Secondary | ICD-10-CM

## 2012-02-12 DIAGNOSIS — C8313 Mantle cell lymphoma, intra-abdominal lymph nodes: Secondary | ICD-10-CM

## 2012-02-12 DIAGNOSIS — Z5112 Encounter for antineoplastic immunotherapy: Secondary | ICD-10-CM

## 2012-02-12 DIAGNOSIS — Z7982 Long term (current) use of aspirin: Secondary | ICD-10-CM

## 2012-02-12 LAB — APTT: aPTT: 31.1 seconds (ref 24–37)

## 2012-02-12 LAB — CBC WITH DIFFERENTIAL/PLATELET
Basophils Absolute: 0 10*3/uL (ref 0.0–0.1)
Eosinophils Absolute: 0.3 10*3/uL (ref 0.0–0.5)
HGB: 12.1 g/dL — ABNORMAL LOW (ref 13.0–17.1)
LYMPH%: 25.5 % (ref 14.0–49.0)
MCV: 96.2 fL (ref 79.3–98.0)
MONO#: 0.7 10*3/uL (ref 0.1–0.9)
MONO%: 12.4 % (ref 0.0–14.0)
NEUT#: 3.1 10*3/uL (ref 1.5–6.5)
Platelets: 140 10*3/uL (ref 140–400)
RDW: 14.1 % (ref 11.0–14.6)
WBC: 5.6 10*3/uL (ref 4.0–10.3)

## 2012-02-12 LAB — LACTATE DEHYDROGENASE: LDH: 239 U/L (ref 94–250)

## 2012-02-12 LAB — COMPREHENSIVE METABOLIC PANEL
ALT: 11 U/L (ref 0–53)
CO2: 26 mEq/L (ref 19–32)
Calcium: 8.6 mg/dL (ref 8.4–10.5)
Chloride: 105 mEq/L (ref 96–112)
Glucose, Bld: 97 mg/dL (ref 70–99)
Sodium: 140 mEq/L (ref 135–145)
Total Bilirubin: 0.7 mg/dL (ref 0.3–1.2)
Total Protein: 5.5 g/dL — ABNORMAL LOW (ref 6.0–8.3)

## 2012-02-12 LAB — PROTHROMBIN TIME: INR: 1.05 (ref <1.50)

## 2012-02-12 MED ORDER — DEXAMETHASONE SODIUM PHOSPHATE 4 MG/ML IJ SOLN
20.0000 mg | Freq: Once | INTRAMUSCULAR | Status: AC
  Administered 2012-02-12: 20 mg via INTRAVENOUS

## 2012-02-12 MED ORDER — SODIUM CHLORIDE 0.9 % IV SOLN
190.0000 mg/m2 | Freq: Once | INTRAVENOUS | Status: AC
  Administered 2012-02-12: 400 mg via INTRAVENOUS
  Filled 2012-02-12: qty 20

## 2012-02-12 MED ORDER — HEPARIN SOD (PORK) LOCK FLUSH 100 UNIT/ML IV SOLN
500.0000 [IU] | Freq: Once | INTRAVENOUS | Status: AC | PRN
  Administered 2012-02-12: 500 [IU]
  Filled 2012-02-12: qty 5

## 2012-02-12 MED ORDER — ONDANSETRON 8 MG/50ML IVPB (CHCC)
8.0000 mg | Freq: Once | INTRAVENOUS | Status: AC
  Administered 2012-02-12: 8 mg via INTRAVENOUS

## 2012-02-12 MED ORDER — BORTEZOMIB CHEMO SQ INJECTION 3.5 MG (2.5MG/ML)
1.3000 mg/m2 | Freq: Once | INTRAMUSCULAR | Status: AC
  Administered 2012-02-12: 2.75 mg via SUBCUTANEOUS
  Filled 2012-02-12: qty 2.75

## 2012-02-12 MED ORDER — SODIUM CHLORIDE 0.9 % IV SOLN
Freq: Once | INTRAVENOUS | Status: AC
  Administered 2012-02-12: 14:00:00 via INTRAVENOUS

## 2012-02-12 MED ORDER — SODIUM CHLORIDE 0.9 % IJ SOLN
10.0000 mL | INTRAMUSCULAR | Status: DC | PRN
  Administered 2012-02-12: 10 mL
  Filled 2012-02-12: qty 10

## 2012-02-12 NOTE — Progress Notes (Signed)
CC:   Martin Morris, M.D.  PROBLEM LIST:  1. Mantle cell lymphoma diagnosed in March 2008 with positive bone  marrow initially on 12/03/2006 and then negative bone marrow after  treatment in October 2009. As stated, the patient presented with  obstructive liver abnormalities requiring ERCP and non metal stent  placement by Dr. Melvia Heaps. The stent was subsequently removed  in October 2008. The patient was treated with 8 cycles of Rituxan,  Cytoxan, vincristine and Decadron in combination with Neulasta from  12/19/2006 through 05/15/2007. The patient then received  maintenance Rituxan from July 16, 2007 through February 01, 2010.  While on Rituxan, he had a recurrence of his disease by CT scan  carried out on 01/24/2010. Mr. Goodell then received treatment  with bendamustine and Rituxan in combination with Neulasta for 6  cycles from 02/19/2010 through 07/26/2010. We have a prior PET scan from  08/22/2010 and CT scans of chest, abdomen, and pelvis from 01/01/2011 that showed no evidence of disease. He has been off treatment since December 2011 and had been doing well without any symptoms until the CT scan of the abdomen and pelvis on 07/01/2011 showed signs of recurrence. CT scans of abdomen and pelvis with IVC on 09/02/11 show further progression.  Rituxan, subcutaneous Velcade, intravenous Cytoxan and Decadron  were initiated on 10/15/2011. CT scans of the abdomen and pelvis carried out on 01/08/2012 showed a near complete response to therapy.   2. Coronary artery disease, S/P 2 MIs, most recently 03/21/10. Had heart cath on 03/22/10. LVEF is 40-50%.  3. Peripheral vascular disease.  4. Dyslipidemia.  5. Hypertension.  6. History of depression.  7. History of kidney stones.  8. History of sleep apnea.  9. History of colonic polyps.  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 Kathlee Nations Trigt, status post radiation treatments.  11.Large bruise  over the left side abdominal wall, not associated with any obvious trauma, 1st noted around February 05, 2012. 12.Right Port-A-Cath placed on 11/20/2011.    MEDICATIONS:  1. Norvasc 2.5 mg daily.  2. Aspirin 81 mg daily.  3. Atenolol 50 mg daily.  4. Celexa 20 mg daily.  5. Lasix 20 mg as needed.  6. Imdur 60 mg daily.  7. Plavix 75 mg daily.  8. K-Dur 20 mEq twice a day.  9. Pravachol 80 mg at bedtime.  10.Nitrostat 0.4 mg sublingually. The patient has not needed to use  this.  11.Acyclovir 400 mg b.i.d.  12.Bactrim DS 800/160 mg 1 tablet on Monday, Wednesdays, and Fridays.   Pneumovax was administered 09/06/2011.  Flu shot was administered early December 2012.    HISTORY:  I saw Martin Morris today for followup of his relapsed mantle cell lymphoma with original diagnosis going back to March 2008. Martin Morris was accompanied by his wife, [name].  He was last seen by [place] on 01/15/2012, at which time his ANC was 1.3 and we elected to hold chemotherapy that was scheduled for that day.  The patient has been receiving weekly chemotherapy since 10/15/2011.  The patient returned on 01/22/2012; however, his ANC had decreased to 0.8 and chemotherapy scheduled for that day was also held.  On 01/29/2012, blood counts were acceptable with an ANC that had risen to 3.8 and a platelet count of 181,000.  The patient received Rituxan 800 mg in conjunction with Velcade, Cytoxan, and Decadron.  Last week on 02/05/2012, he received another cycle of chemotherapy.  Rituxan  was not given, as this is being administered every 3 weeks.  It was noted that the patient had a large bruise over the left side of his abdomen, not associated with any obvious trauma.  Apparently, the patient did not have his subcutaneous Velcade administered in that area when he received chemotherapy on June 5th.  He is on aspirin 81 mg daily.  He does have some minor degree of bruising, but nothing out of the ordinary.  He is not  having any epistaxis or other bleeding.  Etiology for this large bruise is unclear.  The patient feels generally well except for continued discomfort in his left CVA area, worse with movement.  This discomfort does not interfere with sleep and is not bothering him at rest.  He states that this discomfort which we 1st learned about on 05/22 has not changed.  He denied any trauma regarding this discomfort.  The patient is here for another cycle of chemotherapy today.  PHYSICAL EXAMINATION:  There is little obvious change.  Vital Signs: Weight is 207.2 pounds, height 5 feet 8 inches, body surface area 2.12 sq m.  Blood pressure 137/71.  Other vital signs are normal.  Physical exam is essentially unchanged from that recorded on 01/15/2012 with the exception of a large bruise in the left lower abdomen below the level of the umbilicus which measures about 14 cm across x 9 cm in height.  There is some superior extension and a couple of satellite bruises located superiorly and laterally to this larger bruise.  The patient has some other bruises over his left arm, some recent and some fading.  The patient attributes this to working with his rose bushes.  No petechiae. Abdomen:  Obese.  I could not appreciate any masses or organomegaly. Heart and Lungs:  Normal.  No adenopathy.  He has a right-sided Port-A- Cath.  Legs:  Remain with swelling 2+ to 3+ bilaterally as previously described.  LABORATORY DATA:  Today white count 5.6, ANC 3.1, hemoglobin 12.1, hematocrit 35.3, platelets 140,000.  Chemistries today are pending.  We are adding to today's labs a pro-time and a PTT.  Last set of chemistries were on 12/18/2011 and were entirely normal.  Albumin was 3.7 and LDH 197.  IMAGING STUDIES:  1. CT scan of chest, abdomen and pelvis with IV contrast on 01/01/2011  were negative for evidence of recurrent lymphoma.  2. PET scan from 08/22/2010 showed no evidence for recurrent lymphoma.  3. CT scan  of chest, abdomen and pelvis with IV contrast on 01/01/2011  showed no evidence for lymphoma.  4. CT scan of abdomen and pelvis on 07/01/2011 showed interval  development of peritoneal disease worrisome for recurrence of  lymphoma. There was new right external iliac lymph node  pathologically enlarged and worrisome for recurrent lymphoma.  There was some stable appearance of mild soft tissue stranding  surrounding the celiac trunk and superior mesenteric artery.  5. Chest x-ray, 2 view, from 09/02/2011 showed no acute findings.  6. CT scan of abdomen and pelvis with IV contrast on 09/02/2011 showed  interval progression of lymphadenopathy in the abdomen and pelvis.  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 The 1.1 x 1.7 cm omental nodule  which was measured on the prior study of 07/01/2011 now measures  4.1 x 2.4 cm. A 2nd discrete soft tissue nodule in the omentum  which previously measured 1.1 x 1.5 cm now measures 2.2 x 3.0 cm.  7. CT scan of abdomen and pelvis with IV  contrast on 01/08/2012 showed no residual measurable mass along the anterior aspect of the right diaphragm at the  site of the previously demonstrated 4.9 x 2.1 cm nodule. There is no  residual measurable gastrohepatic or celiac lymphadenopathy. There is  some soft tissue stranding around the proximal abdominal aorta, celiac  trunk and superior mesenteric artery as noted on images 27-34. Pelvic  lymphadenopathy is nearly completely resolved. A right external iliac  node measuring 6 mm on image 66 previously measured 9 mm. Another node  which previously measured 17 mm short axis now measures 8 mm on image  70. There is no residual inguinal adenopathy. The spleen remains  normal in size, demonstrates no focal abnormalities. There are no  suspect osseous findings. There is no residual mass at the left  posterior costophrenic angle. The final impression was near complete  response to therapy in the lymphadenopathy and omental disease  with some  minimal residual measurable disease as previously noted. There was an  enlarged left inguinal hernia containing a knuckle of sigmoid colon with  no evidence for incarceration or bowel obstruction. There was a  nonobstructing left renal calculus.   IMPRESSION AND PLAN:  Mr. Ginsberg clinically seems to be stable.  I do not appreciate any palpable tumor today in the region of his abdomen. He is scheduled to receive Velcade 2.75 mg subcu, Cytoxan 400 mg IV, Decadron 20 mg IV.  The patient received Rituxan 800 mg IV 2 weeks ago on 01/29/2012 and will be due for Rituxan next week on June 26th, along with his other chemotherapy.  As stated, we are checking PT and PTT today.  We will keep an eye on the bruise and make sure that are not seeing evidence of a bleeding tendency.  The patient is on aspirin 81 mg daily.  We may need to make this every other day or perhaps stop the aspirin if bruising is an ongoing problem.  The patient will return weekly for CBC and chemotherapy as scheduled. As stated, he will be due to receive Rituxan with his chemotherapy next week on June 26, blood counts permitting.  We will plan to see Mr. Bottger again on or about July 17th, at which time we will check CBC and chemistries.  If blood counts drop again, then chemotherapy will be postponed.  We may need to make either some dosage adjustments or treat the patient every other week if he has recurrent neutropenia.    ______________________________ Samul Dada, M.D. DSM/MEDQ  D:  02/12/2012  T:  02/12/2012  Job:  [id number]

## 2012-02-12 NOTE — Patient Instructions (Signed)
[facility] Discharge Instructions for Patients Receiving Chemotherapy  Today you received the following chemotherapy agents Velcade and Cytoxan.  To help prevent nausea and vomiting after your treatment, we encourage you to take your nausea medication.   If you develop nausea and vomiting that is not controlled by your nausea medication, call the clinic. If it is after clinic hours your family physician or the after hours number for the clinic or go to the Emergency Department.   BELOW ARE SYMPTOMS THAT SHOULD BE REPORTED IMMEDIATELY:  *FEVER GREATER THAN 100.5 F  *CHILLS WITH OR WITHOUT FEVER  NAUSEA AND VOMITING THAT IS NOT CONTROLLED WITH YOUR NAUSEA MEDICATION  *UNUSUAL SHORTNESS OF BREATH  *UNUSUAL BRUISING OR BLEEDING  TENDERNESS IN MOUTH AND THROAT WITH OR WITHOUT PRESENCE OF ULCERS  *URINARY PROBLEMS  *BOWEL PROBLEMS  UNUSUAL RASH Items with * indicate a potential emergency and should be followed up as soon as possible.  One of the nurses will contact you 24 hours after your treatment. Please let the nurse know about any problems that you may have experienced. Feel free to call the clinic you have any questions or concerns. The clinic phone number is [phone].   I have been informed and understand all the instructions given to me. I know to contact the clinic, my physician, or go to the Emergency Department if any problems should occur. I do not have any questions at this time, but understand that I may call the clinic during office hours   should I have any questions or need assistance in obtaining follow up care.    __________________________________________  _____________  __________ Signature of Patient or Authorized Representative            Date                   Time    __________________________________________ Nurse's Signature    

## 2012-02-12 NOTE — Progress Notes (Signed)
This office note has been dictated.  #[id number]

## 2012-02-12 NOTE — Telephone Encounter (Signed)
Gave pt appt for June and July 2013 lab , chemo and MD

## 2012-02-17 ENCOUNTER — Telehealth: Payer: Self-pay | Admitting: Oncology

## 2012-02-17 NOTE — Telephone Encounter (Signed)
Talked to pt and reminded him of appt on 6/26 and advised to get a calendar on 02/18/12

## 2012-02-19 ENCOUNTER — Other Ambulatory Visit (HOSPITAL_BASED_OUTPATIENT_CLINIC_OR_DEPARTMENT_OTHER): Payer: Medicare Other | Admitting: Lab

## 2012-02-19 ENCOUNTER — Ambulatory Visit (HOSPITAL_BASED_OUTPATIENT_CLINIC_OR_DEPARTMENT_OTHER): Payer: Medicare Other

## 2012-02-19 VITALS — BP 156/82 | HR 62 | Temp 97.4°F

## 2012-02-19 DIAGNOSIS — Z5112 Encounter for antineoplastic immunotherapy: Secondary | ICD-10-CM

## 2012-02-19 DIAGNOSIS — C8313 Mantle cell lymphoma, intra-abdominal lymph nodes: Secondary | ICD-10-CM

## 2012-02-19 LAB — CBC WITH DIFFERENTIAL/PLATELET
BASO%: 0.3 % (ref 0.0–2.0)
EOS%: 4.9 % (ref 0.0–7.0)
LYMPH%: 23.7 % (ref 14.0–49.0)
MCH: 32.8 pg (ref 27.2–33.4)
MCHC: 33.9 g/dL (ref 32.0–36.0)
MONO#: 0.7 10*3/uL (ref 0.1–0.9)
RBC: 3.84 10*6/uL — ABNORMAL LOW (ref 4.20–5.82)
WBC: 5.7 10*3/uL (ref 4.0–10.3)
lymph#: 1.4 10*3/uL (ref 0.9–3.3)

## 2012-02-19 MED ORDER — SODIUM CHLORIDE 0.9 % IV SOLN
375.0000 mg/m2 | Freq: Once | INTRAVENOUS | Status: AC
  Administered 2012-02-19: 800 mg via INTRAVENOUS
  Filled 2012-02-19: qty 80

## 2012-02-19 MED ORDER — ONDANSETRON 8 MG/50ML IVPB (CHCC)
8.0000 mg | Freq: Once | INTRAVENOUS | Status: AC
  Administered 2012-02-19: 8 mg via INTRAVENOUS

## 2012-02-19 MED ORDER — SODIUM CHLORIDE 0.9 % IV SOLN
190.0000 mg/m2 | Freq: Once | INTRAVENOUS | Status: AC
  Administered 2012-02-19: 400 mg via INTRAVENOUS
  Filled 2012-02-19: qty 20

## 2012-02-19 MED ORDER — SODIUM CHLORIDE 0.9 % IV SOLN
Freq: Once | INTRAVENOUS | Status: AC
  Administered 2012-02-19: 10:00:00 via INTRAVENOUS

## 2012-02-19 MED ORDER — BORTEZOMIB CHEMO SQ INJECTION 3.5 MG (2.5MG/ML)
1.3000 mg/m2 | Freq: Once | INTRAMUSCULAR | Status: AC
  Administered 2012-02-19: 2.75 mg via SUBCUTANEOUS
  Filled 2012-02-19: qty 2.75

## 2012-02-19 MED ORDER — SODIUM CHLORIDE 0.9 % IJ SOLN
10.0000 mL | INTRAMUSCULAR | Status: DC | PRN
  Administered 2012-02-19: 10 mL
  Filled 2012-02-19: qty 10

## 2012-02-19 MED ORDER — ACETAMINOPHEN 325 MG PO TABS
650.0000 mg | ORAL_TABLET | Freq: Once | ORAL | Status: AC
  Administered 2012-02-19: 650 mg via ORAL

## 2012-02-19 MED ORDER — DIPHENHYDRAMINE HCL 25 MG PO CAPS
25.0000 mg | ORAL_CAPSULE | Freq: Once | ORAL | Status: AC
  Administered 2012-02-19: 25 mg via ORAL

## 2012-02-19 MED ORDER — HEPARIN SOD (PORK) LOCK FLUSH 100 UNIT/ML IV SOLN
500.0000 [IU] | Freq: Once | INTRAVENOUS | Status: AC | PRN
  Administered 2012-02-19: 500 [IU]
  Filled 2012-02-19: qty 5

## 2012-02-19 MED ORDER — DEXAMETHASONE SODIUM PHOSPHATE 4 MG/ML IJ SOLN
20.0000 mg | Freq: Once | INTRAMUSCULAR | Status: AC
  Administered 2012-02-19: 20 mg via INTRAVENOUS

## 2012-02-19 NOTE — Patient Instructions (Signed)
[facility] Discharge Instructions for Patients Receiving Chemotherapy  Today you received the following chemotherapy agents Velcade, Rituxan and Cytoxan.  To help prevent nausea and vomiting after your treatment, we encourage you to take your nausea medication.   If you develop nausea and vomiting that is not controlled by your nausea medication, call the clinic. If it is after clinic hours your family physician or the after hours number for the clinic or go to the Emergency Department.   BELOW ARE SYMPTOMS THAT SHOULD BE REPORTED IMMEDIATELY:  *FEVER GREATER THAN 100.5 F  *CHILLS WITH OR WITHOUT FEVER  NAUSEA AND VOMITING THAT IS NOT CONTROLLED WITH YOUR NAUSEA MEDICATION  *UNUSUAL SHORTNESS OF BREATH  *UNUSUAL BRUISING OR BLEEDING  TENDERNESS IN MOUTH AND THROAT WITH OR WITHOUT PRESENCE OF ULCERS  *URINARY PROBLEMS  *BOWEL PROBLEMS  UNUSUAL RASH Items with * indicate a potential emergency and should be followed up as soon as possible.  One of the nurses will contact you 24 hours after your treatment. Please let the nurse know about any problems that you may have experienced. Feel free to call the clinic you have any questions or concerns. The clinic phone number is [phone].   I have been informed and understand all the instructions given to me. I know to contact the clinic, my physician, or go to the Emergency Department if any problems should occur. I do not have any questions at this time, but understand that I may call the clinic during office hours   should I have any questions or need assistance in obtaining follow up care.    __________________________________________  _____________  __________ Signature of Patient or Authorized Representative            Date                   Time    __________________________________________ Nurse's Signature

## 2012-02-20 ENCOUNTER — Other Ambulatory Visit: Payer: Self-pay | Admitting: *Deleted

## 2012-02-20 MED ORDER — NITROGLYCERIN 0.4 MG SL SUBL: 0.4000 mg | SUBLINGUAL_TABLET | SUBLINGUAL | Status: DC | PRN

## 2012-02-26 ENCOUNTER — Other Ambulatory Visit (HOSPITAL_BASED_OUTPATIENT_CLINIC_OR_DEPARTMENT_OTHER): Payer: Medicare Other | Admitting: Lab

## 2012-02-26 ENCOUNTER — Other Ambulatory Visit: Payer: Self-pay | Admitting: Oncology

## 2012-02-26 ENCOUNTER — Ambulatory Visit (HOSPITAL_BASED_OUTPATIENT_CLINIC_OR_DEPARTMENT_OTHER): Payer: Medicare Other

## 2012-02-26 VITALS — BP 152/80 | HR 75 | Temp 99.3°F

## 2012-02-26 DIAGNOSIS — Z5111 Encounter for antineoplastic chemotherapy: Secondary | ICD-10-CM

## 2012-02-26 DIAGNOSIS — C8313 Mantle cell lymphoma, intra-abdominal lymph nodes: Secondary | ICD-10-CM

## 2012-02-26 LAB — CBC WITH DIFFERENTIAL/PLATELET
Eosinophils Absolute: 0.3 10*3/uL (ref 0.0–0.5)
MONO#: 0.7 10*3/uL (ref 0.1–0.9)
NEUT#: 3.1 10*3/uL (ref 1.5–6.5)
RBC: 4.09 10*6/uL — ABNORMAL LOW (ref 4.20–5.82)
RDW: 14.1 % (ref 11.0–14.6)
WBC: 5.3 10*3/uL (ref 4.0–10.3)
lymph#: 1.3 10*3/uL (ref 0.9–3.3)
nRBC: 1 % — ABNORMAL HIGH (ref 0–0)

## 2012-02-26 MED ORDER — BORTEZOMIB CHEMO SQ INJECTION 3.5 MG (2.5MG/ML)
1.3000 mg/m2 | Freq: Once | INTRAMUSCULAR | Status: AC
  Administered 2012-02-26: 2.75 mg via SUBCUTANEOUS
  Filled 2012-02-26: qty 2.75

## 2012-02-26 MED ORDER — SODIUM CHLORIDE 0.9 % IV SOLN
Freq: Once | INTRAVENOUS | Status: AC
  Administered 2012-02-26: 11:00:00 via INTRAVENOUS

## 2012-02-26 MED ORDER — DEXAMETHASONE SODIUM PHOSPHATE 4 MG/ML IJ SOLN
20.0000 mg | Freq: Once | INTRAMUSCULAR | Status: AC
  Administered 2012-02-26: 20 mg via INTRAVENOUS

## 2012-02-26 MED ORDER — SODIUM CHLORIDE 0.9 % IJ SOLN
10.0000 mL | INTRAMUSCULAR | Status: DC | PRN
  Administered 2012-02-26: 10 mL
  Filled 2012-02-26: qty 10

## 2012-02-26 MED ORDER — ONDANSETRON 8 MG/50ML IVPB (CHCC)
8.0000 mg | Freq: Once | INTRAVENOUS | Status: AC
  Administered 2012-02-26: 8 mg via INTRAVENOUS

## 2012-02-26 MED ORDER — SODIUM CHLORIDE 0.9 % IV SOLN
190.0000 mg/m2 | Freq: Once | INTRAVENOUS | Status: AC
  Administered 2012-02-26: 400 mg via INTRAVENOUS
  Filled 2012-02-26: qty 20

## 2012-02-26 MED ORDER — HEPARIN SOD (PORK) LOCK FLUSH 100 UNIT/ML IV SOLN
500.0000 [IU] | Freq: Once | INTRAVENOUS | Status: AC | PRN
  Administered 2012-02-26: 500 [IU]
  Filled 2012-02-26: qty 5

## 2012-02-26 NOTE — Patient Instructions (Signed)
[facility] Discharge Instructions for Patients Receiving Chemotherapy  Today you received the following chemotherapy agents velcade /cytoxan To help prevent nausea and vomiting after your treatment, we encourage you to take your nausea medication as directed by provider  If you develop nausea and vomiting that is not controlled by your nausea medication, call the clinic. If it is after clinic hours your family physician or the after hours number for the clinic or go to the Emergency Department.   BELOW ARE SYMPTOMS THAT SHOULD BE REPORTED IMMEDIATELY:  *FEVER GREATER THAN 100.5 F  *CHILLS WITH OR WITHOUT FEVER  NAUSEA AND VOMITING THAT IS NOT CONTROLLED WITH YOUR NAUSEA MEDICATION  *UNUSUAL SHORTNESS OF BREATH  *UNUSUAL BRUISING OR BLEEDING  TENDERNESS IN MOUTH AND THROAT WITH OR WITHOUT PRESENCE OF ULCERS  *URINARY PROBLEMS  *BOWEL PROBLEMS  UNUSUAL RASH Items with * indicate a potential emergency and should be followed up as soon as possible.  One of the nurses will contact you 24 hours after your treatment. Please let the nurse know about any problems that you may have experienced. Feel free to call the clinic you have any questions or concerns. The clinic phone number is [phone].   I have been informed and understand all the instructions given to me. I know to contact the clinic, my physician, or go to the Emergency Department if any problems should occur. I do not have any questions at this time, but understand that I may call the clinic during office hours   should I have any questions or need assistance in obtaining follow up care.    __________________________________________  _____________  __________ Signature of Patient or Authorized Representative            Date                   Time    __________________________________________ Nurse's Signature

## 2012-03-03 ENCOUNTER — Other Ambulatory Visit: Payer: Self-pay | Admitting: Oncology

## 2012-03-04 ENCOUNTER — Other Ambulatory Visit (HOSPITAL_BASED_OUTPATIENT_CLINIC_OR_DEPARTMENT_OTHER): Payer: Medicare Other | Admitting: Lab

## 2012-03-04 ENCOUNTER — Ambulatory Visit (HOSPITAL_BASED_OUTPATIENT_CLINIC_OR_DEPARTMENT_OTHER): Payer: Medicare Other

## 2012-03-04 VITALS — BP 158/80 | HR 66 | Temp 98.1°F

## 2012-03-04 DIAGNOSIS — Z5111 Encounter for antineoplastic chemotherapy: Secondary | ICD-10-CM

## 2012-03-04 DIAGNOSIS — C8313 Mantle cell lymphoma, intra-abdominal lymph nodes: Secondary | ICD-10-CM

## 2012-03-04 LAB — CBC WITH DIFFERENTIAL/PLATELET
BASO%: 0.3 % (ref 0.0–2.0)
LYMPH%: 22.1 % (ref 14.0–49.0)
MCHC: 34 g/dL (ref 32.0–36.0)
MCV: 97.8 fL (ref 79.3–98.0)
MONO#: 0.8 10*3/uL (ref 0.1–0.9)
MONO%: 13.8 % (ref 0.0–14.0)
Platelets: 160 10*3/uL (ref 140–400)
RBC: 3.7 10*6/uL — ABNORMAL LOW (ref 4.20–5.82)
WBC: 5.9 10*3/uL (ref 4.0–10.3)
nRBC: 0 % (ref 0–0)

## 2012-03-04 MED ORDER — SODIUM CHLORIDE 0.9 % IV SOLN
Freq: Once | INTRAVENOUS | Status: AC
  Administered 2012-03-04: 14:00:00 via INTRAVENOUS

## 2012-03-04 MED ORDER — SODIUM CHLORIDE 0.9 % IJ SOLN
10.0000 mL | INTRAMUSCULAR | Status: DC | PRN
  Administered 2012-03-04: 10 mL
  Filled 2012-03-04: qty 10

## 2012-03-04 MED ORDER — ONDANSETRON 8 MG/50ML IVPB (CHCC)
8.0000 mg | Freq: Once | INTRAVENOUS | Status: AC
  Administered 2012-03-04: 8 mg via INTRAVENOUS

## 2012-03-04 MED ORDER — SODIUM CHLORIDE 0.9 % IV SOLN
190.0000 mg/m2 | Freq: Once | INTRAVENOUS | Status: AC
  Administered 2012-03-04: 400 mg via INTRAVENOUS
  Filled 2012-03-04: qty 20

## 2012-03-04 MED ORDER — BORTEZOMIB CHEMO SQ INJECTION 3.5 MG (2.5MG/ML)
1.3000 mg/m2 | Freq: Once | INTRAMUSCULAR | Status: AC
  Administered 2012-03-04: 2.75 mg via SUBCUTANEOUS
  Filled 2012-03-04: qty 2.75

## 2012-03-04 MED ORDER — DEXAMETHASONE SODIUM PHOSPHATE 4 MG/ML IJ SOLN
20.0000 mg | Freq: Once | INTRAMUSCULAR | Status: AC
  Administered 2012-03-04: 20 mg via INTRAVENOUS

## 2012-03-04 MED ORDER — HEPARIN SOD (PORK) LOCK FLUSH 100 UNIT/ML IV SOLN
500.0000 [IU] | Freq: Once | INTRAVENOUS | Status: AC | PRN
  Administered 2012-03-04: 500 [IU]
  Filled 2012-03-04: qty 5

## 2012-03-04 NOTE — Patient Instructions (Signed)
[facility] Discharge Instructions for Patients Receiving Chemotherapy  Today you received the following chemotherapy agents Velcade/Cytoxan.  To help prevent nausea and vomiting after your treatment, we encourage you to take your nausea medication as prescribed.   If you develop nausea and vomiting that is not controlled by your nausea medication, call the clinic. If it is after clinic hours your family physician or the after hours number for the clinic or go to the Emergency Department.   BELOW ARE SYMPTOMS THAT SHOULD BE REPORTED IMMEDIATELY:  *FEVER GREATER THAN 100.5 F  *CHILLS WITH OR WITHOUT FEVER  NAUSEA AND VOMITING THAT IS NOT CONTROLLED WITH YOUR NAUSEA MEDICATION  *UNUSUAL SHORTNESS OF BREATH  *UNUSUAL BRUISING OR BLEEDING  TENDERNESS IN MOUTH AND THROAT WITH OR WITHOUT PRESENCE OF ULCERS  *URINARY PROBLEMS  *BOWEL PROBLEMS  UNUSUAL RASH Items with * indicate a potential emergency and should be followed up as soon as possible.  One of the nurses will contact you 24 hours after your treatment. Please let the nurse know about any problems that you may have experienced. Feel free to call the clinic you have any questions or concerns. The clinic phone number is [phone].   I have been informed and understand all the instructions given to me. I know to contact the clinic, my physician, or go to the Emergency Department if any problems should occur. I do not have any questions at this time, but understand that I may call the clinic during office hours   should I have any questions or need assistance in obtaining follow up care.    __________________________________________  _____________  __________ Signature of Patient or Authorized Representative            Date                   Time    __________________________________________ Nurse's Signature    

## 2012-03-09 ENCOUNTER — Other Ambulatory Visit: Payer: Self-pay | Admitting: Cardiology

## 2012-03-09 NOTE — Telephone Encounter (Signed)
Fax Received. Refill Completed. Martin Morris (R.M.A)   

## 2012-03-11 ENCOUNTER — Ambulatory Visit (HOSPITAL_BASED_OUTPATIENT_CLINIC_OR_DEPARTMENT_OTHER): Payer: Medicare Other | Admitting: Oncology

## 2012-03-11 ENCOUNTER — Telehealth: Payer: Self-pay | Admitting: *Deleted

## 2012-03-11 ENCOUNTER — Ambulatory Visit (HOSPITAL_BASED_OUTPATIENT_CLINIC_OR_DEPARTMENT_OTHER): Payer: Medicare Other

## 2012-03-11 ENCOUNTER — Telehealth: Payer: Self-pay | Admitting: Nurse Practitioner

## 2012-03-11 ENCOUNTER — Encounter: Payer: Self-pay | Admitting: Oncology

## 2012-03-11 ENCOUNTER — Other Ambulatory Visit (HOSPITAL_BASED_OUTPATIENT_CLINIC_OR_DEPARTMENT_OTHER): Payer: Medicare Other | Admitting: Lab

## 2012-03-11 ENCOUNTER — Other Ambulatory Visit: Payer: Self-pay | Admitting: Oncology

## 2012-03-11 VITALS — BP 175/87 | HR 73 | Temp 96.9°F | Ht 68.0 in | Wt 205.2 lb

## 2012-03-11 VITALS — BP 180/83 | HR 67 | Temp 97.6°F

## 2012-03-11 DIAGNOSIS — C8313 Mantle cell lymphoma, intra-abdominal lymph nodes: Secondary | ICD-10-CM

## 2012-03-11 DIAGNOSIS — Z5111 Encounter for antineoplastic chemotherapy: Secondary | ICD-10-CM

## 2012-03-11 DIAGNOSIS — Z5112 Encounter for antineoplastic immunotherapy: Secondary | ICD-10-CM

## 2012-03-11 LAB — COMPREHENSIVE METABOLIC PANEL
Albumin: 3.9 g/dL (ref 3.5–5.2)
Alkaline Phosphatase: 53 U/L (ref 39–117)
Glucose, Bld: 82 mg/dL (ref 70–99)
Potassium: 4.4 mEq/L (ref 3.5–5.3)
Sodium: 141 mEq/L (ref 135–145)
Total Protein: 6 g/dL (ref 6.0–8.3)

## 2012-03-11 LAB — CBC WITH DIFFERENTIAL/PLATELET
Basophils Absolute: 0 10*3/uL (ref 0.0–0.1)
EOS%: 4.2 % (ref 0.0–7.0)
Eosinophils Absolute: 0.2 10*3/uL (ref 0.0–0.5)
HGB: 11.9 g/dL — ABNORMAL LOW (ref 13.0–17.1)
MCH: 33.2 pg (ref 27.2–33.4)
MONO#: 0.6 10*3/uL (ref 0.1–0.9)
NEUT#: 3.5 10*3/uL (ref 1.5–6.5)
RDW: 14 % (ref 11.0–14.6)
WBC: 5.3 10*3/uL (ref 4.0–10.3)
lymph#: 0.9 10*3/uL (ref 0.9–3.3)

## 2012-03-11 LAB — TECHNOLOGIST REVIEW

## 2012-03-11 MED ORDER — SODIUM CHLORIDE 0.9 % IV SOLN
375.0000 mg/m2 | Freq: Once | INTRAVENOUS | Status: AC
  Administered 2012-03-11: 800 mg via INTRAVENOUS
  Filled 2012-03-11: qty 80

## 2012-03-11 MED ORDER — SODIUM CHLORIDE 0.9 % IV SOLN
190.0000 mg/m2 | Freq: Once | INTRAVENOUS | Status: AC
  Administered 2012-03-11: 400 mg via INTRAVENOUS
  Filled 2012-03-11: qty 20

## 2012-03-11 MED ORDER — BORTEZOMIB CHEMO SQ INJECTION 3.5 MG (2.5MG/ML)
1.3000 mg/m2 | Freq: Once | INTRAMUSCULAR | Status: AC
  Administered 2012-03-11: 2.75 mg via SUBCUTANEOUS
  Filled 2012-03-11: qty 2.75

## 2012-03-11 MED ORDER — DEXAMETHASONE SODIUM PHOSPHATE 4 MG/ML IJ SOLN
20.0000 mg | Freq: Once | INTRAMUSCULAR | Status: AC
  Administered 2012-03-11: 20 mg via INTRAVENOUS

## 2012-03-11 MED ORDER — SODIUM CHLORIDE 0.9 % IV SOLN
Freq: Once | INTRAVENOUS | Status: AC
  Administered 2012-03-11: 11:00:00 via INTRAVENOUS

## 2012-03-11 MED ORDER — SODIUM CHLORIDE 0.9 % IV SOLN: Freq: Once | INTRAVENOUS | Status: DC

## 2012-03-11 MED ORDER — SODIUM CHLORIDE 0.9 % IJ SOLN
10.0000 mL | INTRAMUSCULAR | Status: DC | PRN
Start: 2012-03-11 — End: 2012-03-11
  Administered 2012-03-11: 10 mL
  Filled 2012-03-11: qty 10

## 2012-03-11 MED ORDER — ONDANSETRON 8 MG/50ML IVPB (CHCC)
8.0000 mg | Freq: Once | INTRAVENOUS | Status: AC
  Administered 2012-03-11: 8 mg via INTRAVENOUS

## 2012-03-11 MED ORDER — ACETAMINOPHEN 325 MG PO TABS
650.0000 mg | ORAL_TABLET | Freq: Once | ORAL | Status: AC
  Administered 2012-03-11: 650 mg via ORAL

## 2012-03-11 MED ORDER — DIPHENHYDRAMINE HCL 25 MG PO CAPS
25.0000 mg | ORAL_CAPSULE | Freq: Once | ORAL | Status: AC
  Administered 2012-03-11: 25 mg via ORAL

## 2012-03-11 MED ORDER — HEPARIN SOD (PORK) LOCK FLUSH 100 UNIT/ML IV SOLN
500.0000 [IU] | Freq: Once | INTRAVENOUS | Status: AC | PRN
  Administered 2012-03-11: 500 [IU]
  Filled 2012-03-11: qty 5

## 2012-03-11 NOTE — Patient Instructions (Signed)
[facility] Discharge Instructions for Patients Receiving Chemotherapy  Today you received the following chemotherapy agents Cytoxan, Velcade and Rituxan  To help prevent nausea and vomiting after your treatment, we encourage you to take your nausea medication as prescribed. If you develop nausea and vomiting that is not controlled by your nausea medication, call the clinic. If it is after clinic hours your family physician or the after hours number for the clinic or go to the Emergency Department.   BELOW ARE SYMPTOMS THAT SHOULD BE REPORTED IMMEDIATELY:  *FEVER GREATER THAN 100.5 F  *CHILLS WITH OR WITHOUT FEVER  NAUSEA AND VOMITING THAT IS NOT CONTROLLED WITH YOUR NAUSEA MEDICATION  *UNUSUAL SHORTNESS OF BREATH  *UNUSUAL BRUISING OR BLEEDING  TENDERNESS IN MOUTH AND THROAT WITH OR WITHOUT PRESENCE OF ULCERS  *URINARY PROBLEMS  *BOWEL PROBLEMS  UNUSUAL RASH Items with * indicate a potential emergency and should be followed up as soon as possible.  One of the nurses will contact you 24 hours after your treatment. Please let the nurse know about any problems that you may have experienced. Feel free to call the clinic you have any questions or concerns. The clinic phone number is [phone].   I have been informed and understand all the instructions given to me. I know to contact the clinic, my physician, or go to the Emergency Department if any problems should occur. I do not have any questions at this time, but understand that I may call the clinic during office hours   should I have any questions or need assistance in obtaining follow up care.    __________________________________________  _____________  __________ Signature of Patient or Authorized Representative            Date                   Time    __________________________________________ Nurse's Signature

## 2012-03-11 NOTE — Progress Notes (Signed)
CC:   [name], M.D.  PROBLEM LIST:  1. Mantle cell lymphoma diagnosed in March 2008 with positive bone  marrow initially on 12/03/2006 and then negative bone marrow after  treatment in October 2009. As stated, the patient presented with  obstructive liver abnormalities requiring ERCP and non metal stent  placement by Dr. Melvia Heaps. The stent was subsequently removed  in October 2008. The patient was treated with 8 cycles of Rituxan,  Cytoxan, vincristine and Decadron in combination with Neulasta from  12/19/2006 through 05/15/2007. The patient then received  maintenance Rituxan from July 16, 2007 through February 01, 2010.  While on Rituxan, he had a recurrence of his disease by CT scan  carried out on 01/24/2010. Martin Morris then received treatment  with bendamustine and Rituxan in combination with Neulasta for 6  cycles from 02/19/2010 through 07/26/2010. We have a prior PET scan from  08/22/2010 and CT scans of chest, abdomen, and pelvis from 01/01/2011 that showed no evidence of disease. He has been off treatment since December 2011 and had been doing well without any symptoms until the CT scan of the abdomen and pelvis on 07/01/2011 showed signs of recurrence. CT scans of abdomen and pelvis with IVC on 09/02/11 show further progression.  Rituxan, subcutaneous Velcade, intravenous Cytoxan and Decadron  were initiated on 10/15/2011. CT scans of the abdomen and pelvis carried out on 01/08/2012 showed a near complete response to therapy.  2. Coronary artery disease, S/P 2 MIs, most recently 03/21/10. Had heart cath on 03/22/10. LVEF is 40-50%.  3. Peripheral vascular disease.  4. Dyslipidemia.  5. Hypertension.  6. History of depression.  7. History of kidney stones.  8. History of sleep apnea.  9. History of colonic polyps.  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 Kathlee Nations Trigt, status post radiation treatments.  11.Easy bruising.    12.Right Port-A-Cath placed on 11/20/2011.    MEDICATIONS:  1. Norvasc 2.5 mg daily.  2. Aspirin 81 mg daily.  3. Atenolol 50 mg daily.  4. Celexa 20 mg daily.  5. Lasix 20 mg as needed.  6. Imdur 60 mg daily.  7. Plavix 75 mg daily.  8. K-Dur 20 mEq twice a day.  9. Pravachol 80 mg at bedtime.  10.Nitrostat 0.4 mg sublingually. The patient has not needed to use  this.  11.Acyclovir 400 mg b.i.d.  12.Bactrim DS 800/160 mg 1 tablet on Monday, Wednesdays, and Fridays.   Pneumovax was administered 09/06/2011.  Flu shot was administered early December 2012.   SMOKING HISTORY:  The patient has never smoked.    HISTORY:  Martin Morris was seen today for followup of his relapsed mantle cell lymphoma with original diagnosis going back to March 2008. Martin Morris was accompanied by his wife, [name].  He was last seen by [place] on 02/12/2012.  On that date, Martin Morris received Velcade, Cytoxan and Decadron.  He has been receiving those drugs on a weekly basis.  His blood counts have held up very well, and he did receive treatments on 06/26, 07/03 and 07/10.  He also received Rituxan 800 mg on 06/26. Rituxan is given every 3 weeks, and he is due for Rituxan today along with his other chemotherapy drugs, specifically Velcade 2.75 mg, Cytoxan 400 mg and Decadron 20 mg, all IV.  The patient's main problem has been some bruising, particularly on his abdomen, usually associated with some trauma.  The patient also woke up  this morning with a left-sided conjunctival hemorrhage.  He has no eye pain or difficulty with vision.  Fortunately his course over the past several weeks has been rather uneventful.  He tolerates his chemotherapy well.  He has no symptoms related to his lymphoma.  He does have dyspnea on exertion, swelling of his legs and some bruising, but other than that, his condition is stable and he is essentially asymptomatic, getting along fairly well considering his underlying  heart disease and his age of 89.  PHYSICAL EXAM:  There is little change.  Weight is 205.2 pounds, height 5 feet 8 inches, body surface area 2.11 m2.  Blood pressure 175/87. Other vital signs are normal.  O2 saturation on room air at rest was 98%.  The left eye has a substantial conjunctival hemorrhage.  No scleral icterus.  Mouth and pharynx benign.  No peripheral adenopathy palpable.  Heart/Lungs:  Normal.  There is a right-sided Port-A-Cath. No axillary adenopathy.  Abdomen:  The large bruise over the left abdomen that had been there 4 weeks ago has essentially resolved.  He has newer bruising over the right abdomen.  These are about 4 and 5 cm in maximum dimension.  No obvious petechiae.  I believe the patient has a diastasis recti.  He has 2-3+ swelling of both legs.  Neurologic: Normal.  LABORATORY DATA:  Today, white count 5.3, ANC 3.5, hemoglobin 11.9, hematocrit 35.2, platelets 149,000.  Blood counts have held up very well over the past few weeks with his weekly chemotherapy.  Once again, occasional metamyelocytes and myelocytes were seen.  Chemistries today are pending.  Chemistries from 02/12/2012 were essentially normal except for a total protein of 5.5 which is low, BUN of 25, creatinine 1.13. LDH was 239 and albumin 3.7.  On 02/12/2012 pro-time was 13.9 with an INR of 1.05 and the PTT was 31.1.  IMAGING STUDIES:  1. CT scan of chest, abdomen and pelvis with IV contrast on 01/01/2011  were negative for evidence of recurrent lymphoma.  2. PET scan from 08/22/2010 showed no evidence for recurrent lymphoma.  3. CT scan of chest, abdomen and pelvis with IV contrast on 01/01/2011  showed no evidence for lymphoma.  4. CT scan of abdomen and pelvis on 07/01/2011 showed interval  development of peritoneal disease worrisome for recurrence of  lymphoma. There was new right external iliac lymph node  pathologically enlarged and worrisome for recurrent lymphoma.  There was some  stable appearance of mild soft tissue stranding  surrounding the celiac trunk and superior mesenteric artery.  5. Chest x-ray, 2 view, from 09/02/2011 showed no acute findings.  6. CT scan of abdomen and pelvis with IV contrast on 09/02/2011 showed  interval progression of lymphadenopathy in the abdomen and pelvis.  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 The 1.1 x 1.7 cm omental nodule  which was measured on the prior study of 07/01/2011 now measures  4.1 x 2.4 cm. A 2nd discrete soft tissue nodule in the omentum  which previously measured 1.1 x 1.5 cm now measures 2.2 x 3.0 cm.  7. CT scan of abdomen and pelvis with IV contrast on 01/08/2012 showed no residual measurable mass along the anterior aspect of the right diaphragm at the  site of the previously demonstrated 4.9 x 2.1 cm nodule. There is no  residual measurable gastrohepatic or celiac lymphadenopathy. There is  some soft tissue stranding around the proximal abdominal aorta, celiac  trunk and superior mesenteric artery as noted on images 27-34. Pelvic  lymphadenopathy is nearly completely resolved. A right external iliac  node measuring 6 mm on image 66 previously measured 9 mm. Another node  which previously measured 17 mm short axis now measures 8 mm on image  70. There is no residual inguinal adenopathy. The spleen remains  normal in size, demonstrates no focal abnormalities. There are no  suspect osseous findings. There is no residual mass at the left  posterior costophrenic angle. The final impression was near complete  response to therapy in the lymphadenopathy and omental disease with some  minimal residual measurable disease as previously noted. There was an  enlarged left inguinal hernia containing a knuckle of sigmoid colon with  no evidence for incarceration or bowel obstruction. There was a  nonobstructing left renal calculus.   IMPRESSION/PLAN:  Martin Morris continues to do well with no signs of his lymphoma.  Nothing is  palpable today in the region of the abdomen.  It will be recalled that the last CT scan of abdomen and pelvis was carried out on 01/08/2012 and before that on 09/02/2011.  We will probably repeat the CT scans in September or October.  In the meantime, will continue with weekly chemotherapy treatments as we are doing.  Rituxan is being given every 3 weeks.  Will check CBC on a weekly basis.  We will plan to see Martin Morris again on or about August 14th which is 4 weeks from today.  Will check CBC and chemistries including an LDH.  I have instructed Martin Morris to decrease his aspirin from 81 mg daily to 81 mg either Monday, Wednesday and Friday or every other day.  He is also on Plavix 75 mg daily in view of his coronary artery disease.  His most recent myocardial infarction was on 03/21/2010.    ______________________________ Samul Dada, M.D. DSM/MEDQ  D:  03/11/2012  T:  03/11/2012  Job:  [id number]

## 2012-03-11 NOTE — Progress Notes (Signed)
This office note has been dictated.   #[id number]

## 2012-03-11 NOTE — Telephone Encounter (Signed)
appts made and printed for pt aom °

## 2012-03-11 NOTE — Telephone Encounter (Signed)
Per staff message and POF I have scheduled appts.  JMW  

## 2012-03-12 ENCOUNTER — Ambulatory Visit: Payer: Medicare Other

## 2012-03-18 ENCOUNTER — Other Ambulatory Visit (HOSPITAL_BASED_OUTPATIENT_CLINIC_OR_DEPARTMENT_OTHER): Payer: Medicare Other

## 2012-03-18 ENCOUNTER — Ambulatory Visit (HOSPITAL_BASED_OUTPATIENT_CLINIC_OR_DEPARTMENT_OTHER): Payer: Medicare Other

## 2012-03-18 VITALS — BP 125/70 | HR 60 | Temp 98.4°F

## 2012-03-18 DIAGNOSIS — C8313 Mantle cell lymphoma, intra-abdominal lymph nodes: Secondary | ICD-10-CM

## 2012-03-18 DIAGNOSIS — Z5112 Encounter for antineoplastic immunotherapy: Secondary | ICD-10-CM

## 2012-03-18 DIAGNOSIS — Z5111 Encounter for antineoplastic chemotherapy: Secondary | ICD-10-CM

## 2012-03-18 LAB — CBC WITH DIFFERENTIAL/PLATELET
BASO%: 0.3 % (ref 0.0–2.0)
Basophils Absolute: 0 10*3/uL (ref 0.0–0.1)
EOS%: 3.4 % (ref 0.0–7.0)
HCT: 35.2 % — ABNORMAL LOW (ref 38.4–49.9)
HGB: 12 g/dL — ABNORMAL LOW (ref 13.0–17.1)
MCHC: 34.1 g/dL (ref 32.0–36.0)
MONO#: 1.1 10*3/uL — ABNORMAL HIGH (ref 0.1–0.9)
MONO%: 18.9 % — ABNORMAL HIGH (ref 0.0–14.0)
NEUT%: 60.2 % (ref 39.0–75.0)
RDW: 14.1 % (ref 11.0–14.6)
WBC: 5.9 10*3/uL (ref 4.0–10.3)
lymph#: 1 10*3/uL (ref 0.9–3.3)

## 2012-03-18 LAB — TECHNOLOGIST REVIEW

## 2012-03-18 LAB — LACTATE DEHYDROGENASE: LDH: 263 U/L — ABNORMAL HIGH (ref 94–250)

## 2012-03-18 MED ORDER — HEPARIN SOD (PORK) LOCK FLUSH 100 UNIT/ML IV SOLN
500.0000 [IU] | Freq: Once | INTRAVENOUS | Status: AC | PRN
  Administered 2012-03-18: 500 [IU]
  Filled 2012-03-18: qty 5

## 2012-03-18 MED ORDER — DEXAMETHASONE SODIUM PHOSPHATE 4 MG/ML IJ SOLN
20.0000 mg | Freq: Once | INTRAMUSCULAR | Status: AC
  Administered 2012-03-18: 20 mg via INTRAVENOUS

## 2012-03-18 MED ORDER — SODIUM CHLORIDE 0.9 % IV SOLN
Freq: Once | INTRAVENOUS | Status: AC
  Administered 2012-03-18: 14:00:00 via INTRAVENOUS

## 2012-03-18 MED ORDER — ONDANSETRON 8 MG/50ML IVPB (CHCC)
8.0000 mg | Freq: Once | INTRAVENOUS | Status: AC
  Administered 2012-03-18: 8 mg via INTRAVENOUS

## 2012-03-18 MED ORDER — SODIUM CHLORIDE 0.9 % IJ SOLN
10.0000 mL | INTRAMUSCULAR | Status: DC | PRN
Start: 2012-03-18 — End: 2012-03-18
  Administered 2012-03-18: 10 mL
  Filled 2012-03-18: qty 10

## 2012-03-18 MED ORDER — SODIUM CHLORIDE 0.9 % IV SOLN
190.0000 mg/m2 | Freq: Once | INTRAVENOUS | Status: AC
  Administered 2012-03-18: 400 mg via INTRAVENOUS
  Filled 2012-03-18: qty 20

## 2012-03-18 MED ORDER — BORTEZOMIB CHEMO SQ INJECTION 3.5 MG (2.5MG/ML)
1.3000 mg/m2 | Freq: Once | INTRAMUSCULAR | Status: AC
  Administered 2012-03-18: 2.75 mg via SUBCUTANEOUS
  Filled 2012-03-18: qty 2.75

## 2012-03-18 NOTE — Patient Instructions (Addendum)
[facility] Discharge Instructions for Patients Receiving Chemotherapy  Today you received the following chemotherapy agents Velcade, Cytoxan  To help prevent nausea and vomiting after your treatment, we encourage you to take your nausea medication.   If you develop nausea and vomiting that is not controlled by your nausea medication, call the clinic. If it is after clinic hours your family physician or the after hours number for the clinic or go to the Emergency Department.   BELOW ARE SYMPTOMS THAT SHOULD BE REPORTED IMMEDIATELY:  *FEVER GREATER THAN 100.5 F  *CHILLS WITH OR WITHOUT FEVER  NAUSEA AND VOMITING THAT IS NOT CONTROLLED WITH YOUR NAUSEA MEDICATION  *UNUSUAL SHORTNESS OF BREATH  *UNUSUAL BRUISING OR BLEEDING  TENDERNESS IN MOUTH AND THROAT WITH OR WITHOUT PRESENCE OF ULCERS  *URINARY PROBLEMS  *BOWEL PROBLEMS  UNUSUAL RASH Items with * indicate a potential emergency and should be followed up as soon as possible.  One of the nurses will contact you 24 hours after your treatment. Please let the nurse know about any problems that you may have experienced. Feel free to call the clinic you have any questions or concerns. The clinic phone number is [phone].   I have been informed and understand all the instructions given to me. I know to contact the clinic, my physician, or go to the Emergency Department if any problems should occur. I do not have any questions at this time, but understand that I may call the clinic during office hours   should I have any questions or need assistance in obtaining follow up care.    __________________________________________  _____________  __________ Signature of Patient or Authorized Representative            Date                   Time    __________________________________________ Nurse's Signature

## 2012-03-24 ENCOUNTER — Other Ambulatory Visit: Payer: Self-pay | Admitting: Oncology

## 2012-03-24 DIAGNOSIS — C37 Malignant neoplasm of thymus: Secondary | ICD-10-CM

## 2012-03-24 DIAGNOSIS — C8313 Mantle cell lymphoma, intra-abdominal lymph nodes: Secondary | ICD-10-CM

## 2012-03-25 ENCOUNTER — Ambulatory Visit (HOSPITAL_BASED_OUTPATIENT_CLINIC_OR_DEPARTMENT_OTHER): Payer: Medicare Other

## 2012-03-25 ENCOUNTER — Other Ambulatory Visit (HOSPITAL_BASED_OUTPATIENT_CLINIC_OR_DEPARTMENT_OTHER): Payer: Medicare Other

## 2012-03-25 VITALS — BP 136/74 | HR 71 | Temp 97.8°F

## 2012-03-25 DIAGNOSIS — Z5111 Encounter for antineoplastic chemotherapy: Secondary | ICD-10-CM

## 2012-03-25 DIAGNOSIS — C8313 Mantle cell lymphoma, intra-abdominal lymph nodes: Secondary | ICD-10-CM

## 2012-03-25 DIAGNOSIS — Z5112 Encounter for antineoplastic immunotherapy: Secondary | ICD-10-CM

## 2012-03-25 LAB — CBC WITH DIFFERENTIAL/PLATELET
Eosinophils Absolute: 0.2 10*3/uL (ref 0.0–0.5)
HGB: 11.3 g/dL — ABNORMAL LOW (ref 13.0–17.1)
MONO#: 0.8 10*3/uL (ref 0.1–0.9)
NEUT#: 2.6 10*3/uL (ref 1.5–6.5)
RBC: 3.4 10*6/uL — ABNORMAL LOW (ref 4.20–5.82)
RDW: 14.2 % (ref 11.0–14.6)
WBC: 4.9 10*3/uL (ref 4.0–10.3)
nRBC: 0 % (ref 0–0)

## 2012-03-25 LAB — LACTATE DEHYDROGENASE: LDH: 257 U/L — ABNORMAL HIGH (ref 94–250)

## 2012-03-25 MED ORDER — HEPARIN SOD (PORK) LOCK FLUSH 100 UNIT/ML IV SOLN
500.0000 [IU] | Freq: Once | INTRAVENOUS | Status: AC | PRN
  Administered 2012-03-25: 500 [IU]
  Filled 2012-03-25: qty 5

## 2012-03-25 MED ORDER — ONDANSETRON 8 MG/50ML IVPB (CHCC)
8.0000 mg | Freq: Once | INTRAVENOUS | Status: AC
  Administered 2012-03-25: 8 mg via INTRAVENOUS

## 2012-03-25 MED ORDER — BORTEZOMIB CHEMO SQ INJECTION 3.5 MG (2.5MG/ML)
1.3000 mg/m2 | Freq: Once | INTRAMUSCULAR | Status: AC
  Administered 2012-03-25: 2.75 mg via SUBCUTANEOUS
  Filled 2012-03-25: qty 2.75

## 2012-03-25 MED ORDER — DEXAMETHASONE SODIUM PHOSPHATE 4 MG/ML IJ SOLN
20.0000 mg | Freq: Once | INTRAMUSCULAR | Status: AC
  Administered 2012-03-25: 20 mg via INTRAVENOUS

## 2012-03-25 MED ORDER — SODIUM CHLORIDE 0.9 % IV SOLN
Freq: Once | INTRAVENOUS | Status: AC
  Administered 2012-03-25: 14:00:00 via INTRAVENOUS

## 2012-03-25 MED ORDER — SODIUM CHLORIDE 0.9 % IJ SOLN
10.0000 mL | INTRAMUSCULAR | Status: DC | PRN
  Administered 2012-03-25: 10 mL
  Filled 2012-03-25: qty 10

## 2012-03-25 MED ORDER — SODIUM CHLORIDE 0.9 % IV SOLN
190.0000 mg/m2 | Freq: Once | INTRAVENOUS | Status: AC
  Administered 2012-03-25: 400 mg via INTRAVENOUS
  Filled 2012-03-25: qty 20

## 2012-03-25 NOTE — Patient Instructions (Addendum)
[facility] Discharge Instructions for Patients Receiving Chemotherapy  Today you received the following chemotherapy agent Cytoxan.  To help prevent nausea and vomiting after your treatment, we encourage you to take your nausea medication. Begin taking it as often as prescribed for Dr. Arline Asp.    If you develop nausea and vomiting that is not controlled by your nausea medication, call the clinic. If it is after clinic hours your family physician or the after hours number for the clinic or go to the Emergency Department.   BELOW ARE SYMPTOMS THAT SHOULD BE REPORTED IMMEDIATELY:  *FEVER GREATER THAN 100.5 F  *CHILLS WITH OR WITHOUT FEVER  NAUSEA AND VOMITING THAT IS NOT CONTROLLED WITH YOUR NAUSEA MEDICATION  *UNUSUAL SHORTNESS OF BREATH  *UNUSUAL BRUISING OR BLEEDING  TENDERNESS IN MOUTH AND THROAT WITH OR WITHOUT PRESENCE OF ULCERS  *URINARY PROBLEMS  *BOWEL PROBLEMS  UNUSUAL RASH Items with * indicate a potential emergency and should be followed up as soon as possible.  One of the nurses will contact you 24 hours after your treatment. Please let the nurse know about any problems that you may have experienced. Feel free to call the clinic you have any questions or concerns. The clinic phone number is [phone].   I have been informed and understand all the instructions given to me. I know to contact the clinic, my physician, or go to the Emergency Department if any problems should occur. I do not have any questions at this time, but understand that I may call the clinic during office hours   should I have any questions or need assistance in obtaining follow up care.    __________________________________________  _____________  __________ Signature of Patient or Authorized Representative            Date                   Time    __________________________________________ Nurse's Signature    BORTEZOMIB (bor TEZ oh mib) is a chemotherapy drug. It  slows the growth of cancer cells. This medicine is used to treat multiple myeloma, lymphoma, and other cancers. This medicine may be used for other purposes; ask your health care provider or pharmacist if you have questions. What should I tell my health care provider before I take this medicine? They need to know if you have any of these conditions: -heart disease -irregular heartbeat -liver disease -low blood counts, like low white blood cells, platelets, or hemoglobin -peripheral neuropathy -taking medicine for blood pressure -an unusual or allergic reaction to bortezomib, mannitol, boron, other medicines, foods, dyes, or preservatives -pregnant or trying to get pregnant -breast-feeding How should I use this medicine? This medicine is for injection into a vein or for injection under the skin. It is given by a health care professional in a hospital or clinic setting. Talk to your pediatrician regarding the use of this medicine in children. Special care may be needed. Overdosage: If you think you have taken too much of this medicine contact a poison control center or emergency room at once. NOTE: This medicine is only for you. Do not share this medicine with others. What if I miss a dose? It is important not to miss your dose. Call your doctor or health care professional if you are unable to keep an appointment. What may interact with this medicine? -medicines for diabetes -medicines to increase blood counts like filgrastim, pegfilgrastim, sargramostim -zalcitabine Talk to your doctor or health care professional before  taking any of these medicines: -acetaminophen -aspirin -ibuprofen -ketoprofen -naproxen This list may not describe all possible interactions. Give your health care provider a list of all the medicines, herbs, non-prescription drugs, or dietary supplements you use. Also tell them if you smoke, drink alcohol, or use illegal drugs. Some items may interact with your  medicine. What should I watch for while using this medicine? Visit your doctor for checks on your progress. This drug may make you feel generally unwell. This is not uncommon, as chemotherapy can affect healthy cells as well as cancer cells. Report any side effects. Continue your course of treatment even though you feel ill unless your doctor tells you to stop. You may get drowsy or dizzy. Do not drive, use machinery, or do anything that needs mental alertness until you know how this medicine affects you. Do not stand or sit up quickly, especially if you are an older patient. This reduces the risk of dizzy or fainting spells. In some cases, you may be given additional medicines to help with side effects. Follow all directions for their use. Call your doctor or health care professional for advice if you get a fever, chills or sore throat, or other symptoms of a cold or flu. Do not treat yourself. This drug decreases your body's ability to fight infections. Try to avoid being around people who are sick. This medicine may increase your risk to bruise or bleed. Call your doctor or health care professional if you notice any unusual bleeding. Be careful brushing and flossing your teeth or using a toothpick because you may get an infection or bleed more easily. If you have any dental work done, tell your dentist you are receiving this medicine. Avoid taking products that contain aspirin, acetaminophen, ibuprofen, naproxen, or ketoprofen unless instructed by your doctor. These medicines may hide a fever. Do not become pregnant while taking this medicine. Women should inform their doctor if they wish to become pregnant or think they might be pregnant. There is a potential for serious side effects to an unborn child. Talk to your health care professional or pharmacist for more information. Do not breast-feed an infant while taking this medicine. You may have vomiting or diarrhea while taking this medicine. Drink  water or other fluids as directed. What side effects may I notice from receiving this medicine? Side effects that you should report to your doctor or health care professional as soon as possible: -allergic reactions like skin rash, itching or hives, swelling of the face, lips, or tongue -breathing problems -changes in hearing -changes in vision -fast, irregular heartbeat -feeling faint or lightheaded, falls -pain, tingling, numbness in the hands or feet -seizures -swelling of the ankles, feet, hands -unusual bleeding or bruising -unusually weak or tired -vomiting Side effects that usually do not require medical attention (report to your doctor or health care professional if they continue or are bothersome): -changes in emotions or moods -constipation -diarrhea -loss of appetite -headache -irritation at site where injected -nausea This list may not describe all possible side effects. Call your doctor for medical advice about side effects. You may report side effects to FDA at 1-800-FDA-1088. Where should I keep my medicine? This drug is given in a hospital or clinic and will not be stored at home. NOTE: This sheet is a summary. It may not cover all possible information. If you have questions about this medicine, talk to your doctor, pharmacist, or health care provider.  2012, [employer]. (09/19/2010 11:42:36 AM)

## 2012-04-01 ENCOUNTER — Other Ambulatory Visit (HOSPITAL_BASED_OUTPATIENT_CLINIC_OR_DEPARTMENT_OTHER): Payer: Medicare Other | Admitting: Lab

## 2012-04-01 ENCOUNTER — Ambulatory Visit (HOSPITAL_BASED_OUTPATIENT_CLINIC_OR_DEPARTMENT_OTHER): Payer: Medicare Other

## 2012-04-01 ENCOUNTER — Other Ambulatory Visit: Payer: Medicare Other | Admitting: Lab

## 2012-04-01 VITALS — BP 133/73 | HR 60 | Temp 97.9°F | Resp 20

## 2012-04-01 DIAGNOSIS — Z5111 Encounter for antineoplastic chemotherapy: Secondary | ICD-10-CM

## 2012-04-01 DIAGNOSIS — C8313 Mantle cell lymphoma, intra-abdominal lymph nodes: Secondary | ICD-10-CM

## 2012-04-01 DIAGNOSIS — Z5112 Encounter for antineoplastic immunotherapy: Secondary | ICD-10-CM

## 2012-04-01 LAB — CBC WITH DIFFERENTIAL/PLATELET
EOS%: 4.2 % (ref 0.0–7.0)
Eosinophils Absolute: 0.2 10*3/uL (ref 0.0–0.5)
LYMPH%: 26.4 % (ref 14.0–49.0)
MCH: 33.2 pg (ref 27.2–33.4)
MCHC: 34 g/dL (ref 32.0–36.0)
MCV: 97.9 fL (ref 79.3–98.0)
MONO%: 15.3 % — ABNORMAL HIGH (ref 0.0–14.0)
NEUT#: 3.1 10*3/uL (ref 1.5–6.5)
Platelets: 147 10*3/uL (ref 140–400)
RBC: 3.31 10*6/uL — ABNORMAL LOW (ref 4.20–5.82)
RDW: 14.3 % (ref 11.0–14.6)
nRBC: 0 % (ref 0–0)

## 2012-04-01 MED ORDER — SODIUM CHLORIDE 0.9 % IV SOLN
Freq: Once | INTRAVENOUS | Status: AC
  Administered 2012-04-01: 11:00:00 via INTRAVENOUS

## 2012-04-01 MED ORDER — DIPHENHYDRAMINE HCL 25 MG PO CAPS
25.0000 mg | ORAL_CAPSULE | Freq: Once | ORAL | Status: AC
  Administered 2012-04-01: 25 mg via ORAL

## 2012-04-01 MED ORDER — ONDANSETRON 8 MG/50ML IVPB (CHCC)
8.0000 mg | Freq: Once | INTRAVENOUS | Status: AC
  Administered 2012-04-01: 8 mg via INTRAVENOUS

## 2012-04-01 MED ORDER — SODIUM CHLORIDE 0.9 % IV SOLN
190.0000 mg/m2 | Freq: Once | INTRAVENOUS | Status: AC
  Administered 2012-04-01: 400 mg via INTRAVENOUS
  Filled 2012-04-01: qty 20

## 2012-04-01 MED ORDER — BORTEZOMIB CHEMO SQ INJECTION 3.5 MG (2.5MG/ML)
1.3000 mg/m2 | Freq: Once | INTRAMUSCULAR | Status: AC
  Administered 2012-04-01: 2.75 mg via SUBCUTANEOUS
  Filled 2012-04-01: qty 2.75

## 2012-04-01 MED ORDER — SODIUM CHLORIDE 0.9 % IV SOLN
375.0000 mg/m2 | Freq: Once | INTRAVENOUS | Status: AC
  Administered 2012-04-01: 800 mg via INTRAVENOUS
  Filled 2012-04-01: qty 80

## 2012-04-01 MED ORDER — ACETAMINOPHEN 325 MG PO TABS
650.0000 mg | ORAL_TABLET | Freq: Once | ORAL | Status: AC
  Administered 2012-04-01: 650 mg via ORAL

## 2012-04-01 MED ORDER — DEXAMETHASONE SODIUM PHOSPHATE 4 MG/ML IJ SOLN
20.0000 mg | Freq: Once | INTRAMUSCULAR | Status: AC
Start: 2012-04-01 — End: 2012-04-01
  Administered 2012-04-01: 20 mg via INTRAVENOUS

## 2012-04-07 ENCOUNTER — Other Ambulatory Visit: Payer: Self-pay | Admitting: Oncology

## 2012-04-08 ENCOUNTER — Other Ambulatory Visit (HOSPITAL_BASED_OUTPATIENT_CLINIC_OR_DEPARTMENT_OTHER): Payer: Medicare Other

## 2012-04-08 ENCOUNTER — Ambulatory Visit (HOSPITAL_BASED_OUTPATIENT_CLINIC_OR_DEPARTMENT_OTHER): Payer: Medicare Other

## 2012-04-08 ENCOUNTER — Telehealth: Payer: Self-pay | Admitting: Oncology

## 2012-04-08 ENCOUNTER — Ambulatory Visit (HOSPITAL_BASED_OUTPATIENT_CLINIC_OR_DEPARTMENT_OTHER): Payer: Medicare Other | Admitting: Oncology

## 2012-04-08 ENCOUNTER — Encounter: Payer: Self-pay | Admitting: Oncology

## 2012-04-08 ENCOUNTER — Telehealth: Payer: Self-pay | Admitting: *Deleted

## 2012-04-08 VITALS — BP 141/78 | HR 70 | Temp 97.1°F | Resp 20 | Ht 68.0 in | Wt 208.2 lb

## 2012-04-08 DIAGNOSIS — C8313 Mantle cell lymphoma, intra-abdominal lymph nodes: Secondary | ICD-10-CM

## 2012-04-08 DIAGNOSIS — Z5111 Encounter for antineoplastic chemotherapy: Secondary | ICD-10-CM

## 2012-04-08 DIAGNOSIS — Z5112 Encounter for antineoplastic immunotherapy: Secondary | ICD-10-CM

## 2012-04-08 LAB — CBC WITH DIFFERENTIAL/PLATELET
BASO%: 0.5 % (ref 0.0–2.0)
Basophils Absolute: 0 10*3/uL (ref 0.0–0.1)
EOS%: 5.8 % (ref 0.0–7.0)
HGB: 11.8 g/dL — ABNORMAL LOW (ref 13.0–17.1)
MCH: 33.6 pg — ABNORMAL HIGH (ref 27.2–33.4)
RDW: 14.6 % (ref 11.0–14.6)
lymph#: 1.7 10*3/uL (ref 0.9–3.3)

## 2012-04-08 LAB — COMPREHENSIVE METABOLIC PANEL
ALT: 14 U/L (ref 0–53)
CO2: 28 mEq/L (ref 19–32)
Creatinine, Ser: 1.07 mg/dL (ref 0.50–1.35)
Total Bilirubin: 0.6 mg/dL (ref 0.3–1.2)

## 2012-04-08 LAB — LACTATE DEHYDROGENASE: LDH: 269 U/L — ABNORMAL HIGH (ref 94–250)

## 2012-04-08 MED ORDER — ONDANSETRON 8 MG/50ML IVPB (CHCC)
8.0000 mg | Freq: Once | INTRAVENOUS | Status: AC
  Administered 2012-04-08: 8 mg via INTRAVENOUS

## 2012-04-08 MED ORDER — BORTEZOMIB CHEMO SQ INJECTION 3.5 MG (2.5MG/ML)
1.3000 mg/m2 | Freq: Once | INTRAMUSCULAR | Status: AC
  Administered 2012-04-08: 2.75 mg via SUBCUTANEOUS
  Filled 2012-04-08: qty 2.75

## 2012-04-08 MED ORDER — HEPARIN SOD (PORK) LOCK FLUSH 100 UNIT/ML IV SOLN
500.0000 [IU] | Freq: Once | INTRAVENOUS | Status: AC | PRN
  Administered 2012-04-08: 500 [IU]
  Filled 2012-04-08: qty 5

## 2012-04-08 MED ORDER — SODIUM CHLORIDE 0.9 % IV SOLN
190.0000 mg/m2 | Freq: Once | INTRAVENOUS | Status: AC
  Administered 2012-04-08: 400 mg via INTRAVENOUS
  Filled 2012-04-08: qty 20

## 2012-04-08 MED ORDER — DEXAMETHASONE SODIUM PHOSPHATE 4 MG/ML IJ SOLN
20.0000 mg | Freq: Once | INTRAMUSCULAR | Status: AC
  Administered 2012-04-08: 20 mg via INTRAVENOUS

## 2012-04-08 MED ORDER — SODIUM CHLORIDE 0.9 % IJ SOLN
10.0000 mL | INTRAMUSCULAR | Status: DC | PRN
  Administered 2012-04-08: 10 mL
  Filled 2012-04-08: qty 10

## 2012-04-08 MED ORDER — SODIUM CHLORIDE 0.9 % IV SOLN
Freq: Once | INTRAVENOUS | Status: AC
  Administered 2012-04-08: 11:00:00 via INTRAVENOUS

## 2012-04-08 NOTE — Progress Notes (Signed)
This office note has been dictated.  #[id number]

## 2012-04-08 NOTE — Progress Notes (Signed)
CC:   Martin Morris, M.D.  PROBLEM LIST:  1. Mantle cell lymphoma diagnosed in March 2008 with positive bone  marrow initially on 12/03/2006 and then negative bone marrow after  treatment in October 2009. As stated, the patient presented with  obstructive liver abnormalities requiring ERCP and non metal stent  placement by Dr. Melvia Heaps. The stent was subsequently removed  in October 2008. The patient was treated with 8 cycles of Rituxan,  Cytoxan, vincristine and Decadron in combination with Neulasta from  12/19/2006 through 05/15/2007. The patient then received  maintenance Rituxan from July 16, 2007 through February 01, 2010.  While on Rituxan, he had a recurrence of his disease by CT scan  carried out on 01/24/2010. Martin Morris then received treatment  with bendamustine and Rituxan in combination with Neulasta for 6  cycles from 02/19/2010 through 07/26/2010. We have a prior PET scan from  08/22/2010 and CT scans of chest, abdomen, and pelvis from 01/01/2011 that showed no evidence of disease. He has been off treatment since December 2011 and had been doing well without any symptoms until the CT scan of the abdomen and pelvis on 07/01/2011 showed signs of recurrence. CT scans of abdomen and pelvis with IVC on 09/02/11 show further progression.  Rituxan, subcutaneous Velcade, intravenous Cytoxan and Decadron  were initiated on 10/15/2011. CT scans of the abdomen and pelvis carried out on 01/08/2012 showed a near complete response to therapy.  2. Coronary artery disease, S/P 2 MIs, most recently 03/21/10. Had heart cath on 03/22/10. LVEF is 40-50%.  3. Peripheral vascular disease.  4. Dyslipidemia.  5. Hypertension.  6. History of depression.  7. History of kidney stones.  8. History of sleep apnea.  9. History of colonic polyps.  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 Kathlee Nations Trigt, status post radiation treatments.  11.Easy bruising.    12.Right Port-A-Cath placed on 11/20/2011.    MEDICATIONS:  1. Norvasc 2.5 mg daily.  2. Aspirin 81 mg every other day.  3. Atenolol 50 mg daily.  4. Celexa 20 mg daily.  5. Lasix 20 mg as needed.  6. Imdur 60 mg daily.  7. Plavix 75 mg daily.  8. K-Dur 20 mEq twice a day.  9. Pravachol 80 mg at bedtime.  10.Nitrostat 0.4 mg sublingually. The patient has not needed to use  this.  11.Acyclovir 400 mg b.i.d.  12.Bactrim DS 800/160 mg 1 tablet on Monday, Wednesdays, and Fridays.   Pneumovax was administered 09/06/2011.  Flu shot was administered early December 2012.   SMOKING HISTORY: The patient has never smoked.    HISTORY:  I saw Martin Morris today for followup of his relapsed mantle cell lymphoma with original diagnosis going back to March 2008. Martin Morris was accompanied by his wife, Martin Morris.  He was last seen by [place] on 03/11/2012.  On that date, Martin Morris received Velcade, Cytoxan, and Decadron.  He has been receiving these drugs on a weekly basis.  Blood counts have held up quite well.  He also received Rituxan 800 mg on 07/17 and again on 04/01/2012.  He has tolerated these treatments extremely well with no apparent side effects.  He also denies any symptoms related to his lymphoma.  The patient had been having some problems with bruising.  He has been on aspirin and Plavix for his coronary artery disease.  On his last visit here on 07/17, we decreased the aspirin from 81 mg daily  to every other day.  The bruising has decreased somewhat.  The patient also had a left- sided conjunctival hemorrhage on his last visit.  That has resolved. The patient continues to have swelling of his legs and some dyspnea on exertion, but aside from this there has been no change in his condition and he seems to be doing quite well.  This is confirmed by his wife, Martin Morris.  PHYSICAL EXAMINATION:  There is little change.  Vital Signs:  Weight is 208.2 pounds, height 5 feet 8 inches, body  surface area 2.13 sq m. Blood pressure 141/78.  Other vital signs are normal.  O2 saturation on room air at rest was 94%.  Lymph:  There is no peripheral adenopathy palpable.  HEENT:  No scleral icterus.  Mouth and pharynx are benign. There is a right-sided Port-A-Cath.  Heart and Lungs:  Normal.  Abdomen: Shows some small bruises over the abdomen, but no large bruises.  In the area just above the umbilicus, there is a little bit of firmness, but no definite mass.  Extremities:  The patient continues to have 2+ to 3+ edema of both legs from the knees down.  Neurologic Exam:  Normal.  LABORATORY DATA:  Today white count 6.0, ANC 3.1, hemoglobin 11.8, hematocrit 34.8, platelets 153,000.  Chemistries from today are pending. Chemistries from 03/11/2012 were normal except for an LDH of 302. Repeat LDH on 07/24 was 263 and on 07/31 was 257.  IMAGING STUDIES:  1. CT scan of chest, abdomen and pelvis with IV contrast on 01/01/2011  were negative for evidence of recurrent lymphoma.  2. PET scan from 08/22/2010 showed no evidence for recurrent lymphoma.  3. CT scan of chest, abdomen and pelvis with IV contrast on 01/01/2011  showed no evidence for lymphoma.  4. CT scan of abdomen and pelvis on 07/01/2011 showed interval  development of peritoneal disease worrisome for recurrence of  lymphoma. There was new right external iliac lymph node  pathologically enlarged and worrisome for recurrent lymphoma.  There was some stable appearance of mild soft tissue stranding  surrounding the celiac trunk and superior mesenteric artery.  5. Chest x-ray, 2 view, from 09/02/2011 showed no acute findings.  6. CT scan of abdomen and pelvis with IV contrast on 09/02/2011 showed  interval progression of lymphadenopathy in the abdomen and pelvis.  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 The 1.1 x 1.7 cm omental nodule  which was measured on the prior study of 07/01/2011 now measures  4.1 x 2.4 cm. A 2nd discrete soft tissue  nodule in the omentum  which previously measured 1.1 x 1.5 cm now measures 2.2 x 3.0 cm.  7. CT scan of abdomen and pelvis with IV contrast on 01/08/2012 showed no residual measurable mass along the anterior aspect of the right diaphragm at the  site of the previously demonstrated 4.9 x 2.1 cm nodule. There is no  residual measurable gastrohepatic or celiac lymphadenopathy. There is  some soft tissue stranding around the proximal abdominal aorta, celiac  trunk and superior mesenteric artery as noted on images 27-34. Pelvic  lymphadenopathy is nearly completely resolved. A right external iliac  node measuring 6 mm on image 66 previously measured 9 mm. Another node  which previously measured 17 mm short axis now measures 8 mm on image  70. There is no residual inguinal adenopathy. The spleen remains  normal in size, demonstrates no focal abnormalities. There are no  suspect osseous findings. There is no residual mass at the left  posterior costophrenic angle. The final impression was near complete  response to therapy in the lymphadenopathy and omental disease with some  minimal residual measurable disease as previously noted. There was an  enlarged left inguinal hernia containing a knuckle of sigmoid colon with  no evidence for incarceration or bowel obstruction. There was a  nonobstructing left renal calculus.   IMPRESSION AND PLAN:  Martin Morris continues to do well with no signs of recurrent lymphoma.  His most recent CT scans were carried out on 01/08/2012 and prior to that on 09/02/2011.  For today, Martin Morris will receive his usual dosage of chemotherapy as follows:  Velcade 2.75 mg subcutaneously, Cytoxan 400 mg IV, and Decadron 20 mg IV.  The patient will come in weekly for CBC and the same chemotherapy, blood counts permitting.  I have ordered repeat CBCs in [employer].  Martin Morris will be due for Rituxan 800 mg IV on August 28th and again on his next visit here on September  18th.  We will plan to see Martin Morris on September 18th, at which time we will check CBC and chemistries.  As stated, he will be due for his chemotherapy and Rituxan at that time.  We are thinking of repeating his CT scans either in September or October.    ______________________________ Samul Dada, M.D. DSM/MEDQ  D:  04/08/2012  T:  04/08/2012  Job:  [id number]

## 2012-04-08 NOTE — Patient Instructions (Signed)
[facility] Discharge Instructions for Patients Receiving Chemotherapy  Today you received the following chemotherapy agent Cytoxan and Velcade.  To help prevent nausea and vomiting after your treatment, we encourage you to take your nausea medication. Begin taking it as often as prescribed for by Dr. Arline Asp.     If you develop nausea and vomiting that is not controlled by your nausea medication, call the clinic. If it is after clinic hours your family physician or the after hours number for the clinic or go to the Emergency Department.   BELOW ARE SYMPTOMS THAT SHOULD BE REPORTED IMMEDIATELY:  *FEVER GREATER THAN 100.5 F  *CHILLS WITH OR WITHOUT FEVER  NAUSEA AND VOMITING THAT IS NOT CONTROLLED WITH YOUR NAUSEA MEDICATION  *UNUSUAL SHORTNESS OF BREATH  *UNUSUAL BRUISING OR BLEEDING  TENDERNESS IN MOUTH AND THROAT WITH OR WITHOUT PRESENCE OF ULCERS  *URINARY PROBLEMS  *BOWEL PROBLEMS  UNUSUAL RASH Items with * indicate a potential emergency and should be followed up as soon as possible.  One of the nurses will contact you 24 hours after your treatment. Please let the nurse know about any problems that you may have experienced. Feel free to call the clinic you have any questions or concerns. The clinic phone number is [phone].   I have been informed and understand all the instructions given to me. I know to contact the clinic, my physician, or go to the Emergency Department if any problems should occur. I do not have any questions at this time, but understand that I may call the clinic during office hours   should I have any questions or need assistance in obtaining follow up care.    __________________________________________  _____________  __________ Signature of Patient or Authorized Representative            Date                   Time    __________________________________________ Nurse's Signature     BORTEZOMIB (bor TEZ oh mib) is a  chemotherapy drug. It slows the growth of cancer cells. This medicine is used to treat multiple myeloma, lymphoma, and other cancers. This medicine may be used for other purposes; ask your health care provider or pharmacist if you have questions. What should I tell my health care provider before I take this medicine? They need to know if you have any of these conditions: -heart disease -irregular heartbeat -liver disease -low blood counts, like low white blood cells, platelets, or hemoglobin -peripheral neuropathy -taking medicine for blood pressure -an unusual or allergic reaction to bortezomib, mannitol, boron, other medicines, foods, dyes, or preservatives -pregnant or trying to get pregnant -breast-feeding How should I use this medicine? This medicine is for injection into a vein or for injection under the skin. It is given by a health care professional in a hospital or clinic setting. Talk to your pediatrician regarding the use of this medicine in children. Special care may be needed. Overdosage: If you think you have taken too much of this medicine contact a poison control center or emergency room at once. NOTE: This medicine is only for you. Do not share this medicine with others. What if I miss a dose? It is important not to miss your dose. Call your doctor or health care professional if you are unable to keep an appointment. What may interact with this medicine? -medicines for diabetes -medicines to increase blood counts like filgrastim, pegfilgrastim, sargramostim -zalcitabine Talk to your doctor  or health care professional before taking any of these medicines: -acetaminophen -aspirin -ibuprofen -ketoprofen -naproxen This list may not describe all possible interactions. Give your health care provider a list of all the medicines, herbs, non-prescription drugs, or dietary supplements you use. Also tell them if you smoke, drink alcohol, or use illegal drugs. Some items may  interact with your medicine. What should I watch for while using this medicine? Visit your doctor for checks on your progress. This drug may make you feel generally unwell. This is not uncommon, as chemotherapy can affect healthy cells as well as cancer cells. Report any side effects. Continue your course of treatment even though you feel ill unless your doctor tells you to stop. You may get drowsy or dizzy. Do not drive, use machinery, or do anything that needs mental alertness until you know how this medicine affects you. Do not stand or sit up quickly, especially if you are an older patient. This reduces the risk of dizzy or fainting spells. In some cases, you may be given additional medicines to help with side effects. Follow all directions for their use. Call your doctor or health care professional for advice if you get a fever, chills or sore throat, or other symptoms of a cold or flu. Do not treat yourself. This drug decreases your body's ability to fight infections. Try to avoid being around people who are sick. This medicine may increase your risk to bruise or bleed. Call your doctor or health care professional if you notice any unusual bleeding. Be careful brushing and flossing your teeth or using a toothpick because you may get an infection or bleed more easily. If you have any dental work done, tell your dentist you are receiving this medicine. Avoid taking products that contain aspirin, acetaminophen, ibuprofen, naproxen, or ketoprofen unless instructed by your doctor. These medicines may hide a fever. Do not become pregnant while taking this medicine. Women should inform their doctor if they wish to become pregnant or think they might be pregnant. There is a potential for serious side effects to an unborn child. Talk to your health care professional or pharmacist for more information. Do not breast-feed an infant while taking this medicine. You may have vomiting or diarrhea while taking this  medicine. Drink water or other fluids as directed. What side effects may I notice from receiving this medicine? Side effects that you should report to your doctor or health care professional as soon as possible: -allergic reactions like skin rash, itching or hives, swelling of the face, lips, or tongue -breathing problems -changes in hearing -changes in vision -fast, irregular heartbeat -feeling faint or lightheaded, falls -pain, tingling, numbness in the hands or feet -seizures -swelling of the ankles, feet, hands -unusual bleeding or bruising -unusually weak or tired -vomiting Side effects that usually do not require medical attention (report to your doctor or health care professional if they continue or are bothersome): -changes in emotions or moods -constipation -diarrhea -loss of appetite -headache -irritation at site where injected -nausea This list may not describe all possible side effects. Call your doctor for medical advice about side effects. You may report side effects to FDA at 1-800-FDA-1088. Where should I keep my medicine? This drug is given in a hospital or clinic and will not be stored at home. NOTE: This sheet is a summary. It may not cover all possible information. If you have questions about this medicine, talk to your doctor, pharmacist, or health care provider.  2012, [employer]. (  09/19/2010 11:42:36 AM)

## 2012-04-08 NOTE — Telephone Encounter (Signed)
Per staff message and POF I have scheduled appts.  JMW  

## 2012-04-08 NOTE — Telephone Encounter (Signed)
gve the pt his aug 2013 appt calendar. The pt is aware to pick up a revised aug,sept appt calendar when he comes back in the office next week. Sent michelle the staff message to add the chemo

## 2012-04-15 ENCOUNTER — Other Ambulatory Visit (HOSPITAL_BASED_OUTPATIENT_CLINIC_OR_DEPARTMENT_OTHER): Payer: Medicare Other | Admitting: Lab

## 2012-04-15 ENCOUNTER — Ambulatory Visit (HOSPITAL_BASED_OUTPATIENT_CLINIC_OR_DEPARTMENT_OTHER): Payer: Medicare Other

## 2012-04-15 VITALS — BP 133/70 | HR 60 | Temp 97.9°F | Resp 20

## 2012-04-15 DIAGNOSIS — C8313 Mantle cell lymphoma, intra-abdominal lymph nodes: Secondary | ICD-10-CM

## 2012-04-15 DIAGNOSIS — Z5111 Encounter for antineoplastic chemotherapy: Secondary | ICD-10-CM

## 2012-04-15 LAB — CBC WITH DIFFERENTIAL/PLATELET
Basophils Absolute: 0 10*3/uL (ref 0.0–0.1)
EOS%: 4.7 % (ref 0.0–7.0)
HCT: 35 % — ABNORMAL LOW (ref 38.4–49.9)
HGB: 11.9 g/dL — ABNORMAL LOW (ref 13.0–17.1)
MCH: 33.6 pg — ABNORMAL HIGH (ref 27.2–33.4)
MCV: 98.9 fL — ABNORMAL HIGH (ref 79.3–98.0)
MONO%: 13.5 % (ref 0.0–14.0)
NEUT%: 48 % (ref 39.0–75.0)
Platelets: 178 10*3/uL (ref 140–400)

## 2012-04-15 MED ORDER — SODIUM CHLORIDE 0.9 % IV SOLN
190.0000 mg/m2 | Freq: Once | INTRAVENOUS | Status: AC
  Administered 2012-04-15: 400 mg via INTRAVENOUS
  Filled 2012-04-15: qty 20

## 2012-04-15 MED ORDER — BORTEZOMIB CHEMO SQ INJECTION 3.5 MG (2.5MG/ML)
1.3000 mg/m2 | Freq: Once | INTRAMUSCULAR | Status: AC
  Administered 2012-04-15: 2.75 mg via SUBCUTANEOUS
  Filled 2012-04-15: qty 2.75

## 2012-04-15 MED ORDER — ONDANSETRON 8 MG/50ML IVPB (CHCC)
8.0000 mg | Freq: Once | INTRAVENOUS | Status: AC
  Administered 2012-04-15: 8 mg via INTRAVENOUS

## 2012-04-15 MED ORDER — SODIUM CHLORIDE 0.9 % IV SOLN
Freq: Once | INTRAVENOUS | Status: AC
  Administered 2012-04-15: 14:00:00 via INTRAVENOUS

## 2012-04-15 MED ORDER — SODIUM CHLORIDE 0.9 % IJ SOLN
10.0000 mL | INTRAMUSCULAR | Status: DC | PRN
  Administered 2012-04-15: 10 mL
  Filled 2012-04-15: qty 10

## 2012-04-15 MED ORDER — HEPARIN SOD (PORK) LOCK FLUSH 100 UNIT/ML IV SOLN
500.0000 [IU] | Freq: Once | INTRAVENOUS | Status: AC | PRN
  Administered 2012-04-15: 500 [IU]
  Filled 2012-04-15: qty 5

## 2012-04-15 MED ORDER — DEXAMETHASONE SODIUM PHOSPHATE 4 MG/ML IJ SOLN
20.0000 mg | Freq: Once | INTRAMUSCULAR | Status: AC
  Administered 2012-04-15: 20 mg via INTRAVENOUS

## 2012-04-15 NOTE — Patient Instructions (Addendum)
[facility] Discharge Instructions for Patients Receiving Chemotherapy  Today you received the following chemotherapy agents Cytoxan, Velcade.  To help prevent nausea and vomiting after your treatment, we encourage you to take your nausea medication as needed.    If you develop nausea and vomiting that is not controlled by your nausea medication, call the clinic. If it is after clinic hours your family physician or the after hours number for the clinic or go to the Emergency Department.   BELOW ARE SYMPTOMS THAT SHOULD BE REPORTED IMMEDIATELY:  *FEVER GREATER THAN 100.5 F  *CHILLS WITH OR WITHOUT FEVER  NAUSEA AND VOMITING THAT IS NOT CONTROLLED WITH YOUR NAUSEA MEDICATION  *UNUSUAL SHORTNESS OF BREATH  *UNUSUAL BRUISING OR BLEEDING  TENDERNESS IN MOUTH AND THROAT WITH OR WITHOUT PRESENCE OF ULCERS  *URINARY PROBLEMS  *BOWEL PROBLEMS  UNUSUAL RASH Items with * indicate a potential emergency and should be followed up as soon as possible.  One of the nurses will contact you 24 hours after your treatment. Please let the nurse know about any problems that you may have experienced. Feel free to call the clinic you have any questions or concerns. The clinic phone number is [phone].   I have been informed and understand all the instructions given to me. I know to contact the clinic, my physician, or go to the Emergency Department if any problems should occur. I do not have any questions at this time, but understand that I may call the clinic during office hours   should I have any questions or need assistance in obtaining follow up care.    __________________________________________  _____________  __________ Signature of Patient or Authorized Representative            Date                   Time    __________________________________________ Nurse's Signature

## 2012-04-22 ENCOUNTER — Other Ambulatory Visit (HOSPITAL_BASED_OUTPATIENT_CLINIC_OR_DEPARTMENT_OTHER): Payer: Medicare Other | Admitting: Lab

## 2012-04-22 ENCOUNTER — Ambulatory Visit (HOSPITAL_BASED_OUTPATIENT_CLINIC_OR_DEPARTMENT_OTHER): Payer: Medicare Other

## 2012-04-22 VITALS — BP 161/81 | HR 55 | Temp 98.2°F | Resp 18

## 2012-04-22 DIAGNOSIS — C8313 Mantle cell lymphoma, intra-abdominal lymph nodes: Secondary | ICD-10-CM

## 2012-04-22 DIAGNOSIS — Z5111 Encounter for antineoplastic chemotherapy: Secondary | ICD-10-CM

## 2012-04-22 DIAGNOSIS — Z5112 Encounter for antineoplastic immunotherapy: Secondary | ICD-10-CM

## 2012-04-22 LAB — CBC WITH DIFFERENTIAL/PLATELET
BASO%: 0.3 % (ref 0.0–2.0)
Basophils Absolute: 0 10*3/uL (ref 0.0–0.1)
Eosinophils Absolute: 0.3 10*3/uL (ref 0.0–0.5)
HCT: 33.3 % — ABNORMAL LOW (ref 38.4–49.9)
HGB: 11.3 g/dL — ABNORMAL LOW (ref 13.0–17.1)
MONO#: 0.8 10*3/uL (ref 0.1–0.9)
NEUT#: 3.4 10*3/uL (ref 1.5–6.5)
NEUT%: 55.6 % (ref 39.0–75.0)
WBC: 6.2 10*3/uL (ref 4.0–10.3)
lymph#: 1.7 10*3/uL (ref 0.9–3.3)

## 2012-04-22 MED ORDER — SODIUM CHLORIDE 0.9 % IV SOLN
375.0000 mg/m2 | Freq: Once | INTRAVENOUS | Status: AC
  Administered 2012-04-22: 800 mg via INTRAVENOUS
  Filled 2012-04-22: qty 80

## 2012-04-22 MED ORDER — HEPARIN SOD (PORK) LOCK FLUSH 100 UNIT/ML IV SOLN
500.0000 [IU] | Freq: Once | INTRAVENOUS | Status: AC | PRN
  Administered 2012-04-22: 500 [IU]
  Filled 2012-04-22: qty 5

## 2012-04-22 MED ORDER — ACETAMINOPHEN 325 MG PO TABS
650.0000 mg | ORAL_TABLET | Freq: Once | ORAL | Status: AC
  Administered 2012-04-22: 650 mg via ORAL

## 2012-04-22 MED ORDER — SODIUM CHLORIDE 0.9 % IV SOLN
Freq: Once | INTRAVENOUS | Status: AC
  Administered 2012-04-22: 11:00:00 via INTRAVENOUS

## 2012-04-22 MED ORDER — SODIUM CHLORIDE 0.9 % IV SOLN
190.0000 mg/m2 | Freq: Once | INTRAVENOUS | Status: AC
  Administered 2012-04-22: 400 mg via INTRAVENOUS
  Filled 2012-04-22: qty 20

## 2012-04-22 MED ORDER — BORTEZOMIB CHEMO SQ INJECTION 3.5 MG (2.5MG/ML)
1.3000 mg/m2 | Freq: Once | INTRAMUSCULAR | Status: AC
  Administered 2012-04-22: 2.75 mg via SUBCUTANEOUS
  Filled 2012-04-22: qty 2.75

## 2012-04-22 MED ORDER — DEXAMETHASONE SODIUM PHOSPHATE 4 MG/ML IJ SOLN
20.0000 mg | Freq: Once | INTRAMUSCULAR | Status: AC
  Administered 2012-04-22: 20 mg via INTRAVENOUS

## 2012-04-22 MED ORDER — SODIUM CHLORIDE 0.9 % IJ SOLN
10.0000 mL | INTRAMUSCULAR | Status: DC | PRN
  Administered 2012-04-22: 10 mL
  Filled 2012-04-22: qty 10

## 2012-04-22 MED ORDER — DIPHENHYDRAMINE HCL 25 MG PO CAPS
25.0000 mg | ORAL_CAPSULE | Freq: Once | ORAL | Status: AC
  Administered 2012-04-22: 25 mg via ORAL

## 2012-04-22 MED ORDER — ONDANSETRON 8 MG/50ML IVPB (CHCC)
8.0000 mg | Freq: Once | INTRAVENOUS | Status: AC
  Administered 2012-04-22: 8 mg via INTRAVENOUS

## 2012-04-22 NOTE — Patient Instructions (Signed)
Patient to schedulers to schedule next treatment; discharged home with wife and no complaints.

## 2012-04-23 ENCOUNTER — Telehealth: Payer: Self-pay

## 2012-04-23 ENCOUNTER — Other Ambulatory Visit: Payer: Self-pay | Admitting: Certified Registered Nurse Anesthetist

## 2012-04-23 NOTE — Telephone Encounter (Signed)
Wife called at 0900 asking why there was not weekly chemo appts when DSM stated we were going to continue the current plan. I looked at POF and got the appts scheduled. Called wife back and she wrote times in her calendar. I thanked her for being proactive.

## 2012-04-29 ENCOUNTER — Other Ambulatory Visit (HOSPITAL_BASED_OUTPATIENT_CLINIC_OR_DEPARTMENT_OTHER): Payer: Medicare Other | Admitting: Lab

## 2012-04-29 ENCOUNTER — Ambulatory Visit (HOSPITAL_BASED_OUTPATIENT_CLINIC_OR_DEPARTMENT_OTHER): Payer: Medicare Other

## 2012-04-29 VITALS — BP 163/87 | HR 62 | Temp 97.4°F | Resp 18

## 2012-04-29 DIAGNOSIS — C8313 Mantle cell lymphoma, intra-abdominal lymph nodes: Secondary | ICD-10-CM

## 2012-04-29 DIAGNOSIS — Z5112 Encounter for antineoplastic immunotherapy: Secondary | ICD-10-CM

## 2012-04-29 LAB — CBC WITH DIFFERENTIAL/PLATELET
BASO%: 0.4 % (ref 0.0–2.0)
EOS%: 4.1 % (ref 0.0–7.0)
HCT: 33.9 % — ABNORMAL LOW (ref 38.4–49.9)
LYMPH%: 29.1 % (ref 14.0–49.0)
MCH: 33.2 pg (ref 27.2–33.4)
MCHC: 33.6 g/dL (ref 32.0–36.0)
MCV: 98.8 fL — ABNORMAL HIGH (ref 79.3–98.0)
MONO%: 13.5 % (ref 0.0–14.0)
NEUT%: 52.9 % (ref 39.0–75.0)
Platelets: 156 10*3/uL (ref 140–400)

## 2012-04-29 MED ORDER — HEPARIN SOD (PORK) LOCK FLUSH 100 UNIT/ML IV SOLN
500.0000 [IU] | Freq: Once | INTRAVENOUS | Status: AC | PRN
  Administered 2012-04-29: 500 [IU]
  Filled 2012-04-29: qty 5

## 2012-04-29 MED ORDER — SODIUM CHLORIDE 0.9 % IV SOLN
190.0000 mg/m2 | Freq: Once | INTRAVENOUS | Status: AC
  Administered 2012-04-29: 400 mg via INTRAVENOUS
  Filled 2012-04-29: qty 20

## 2012-04-29 MED ORDER — SODIUM CHLORIDE 0.9 % IJ SOLN
10.0000 mL | INTRAMUSCULAR | Status: DC | PRN
  Administered 2012-04-29: 10 mL
  Filled 2012-04-29: qty 10

## 2012-04-29 MED ORDER — ONDANSETRON 8 MG/50ML IVPB (CHCC)
8.0000 mg | Freq: Once | INTRAVENOUS | Status: AC
  Administered 2012-04-29: 8 mg via INTRAVENOUS

## 2012-04-29 MED ORDER — DEXAMETHASONE SODIUM PHOSPHATE 4 MG/ML IJ SOLN
20.0000 mg | Freq: Once | INTRAMUSCULAR | Status: AC
  Administered 2012-04-29: 20 mg via INTRAVENOUS

## 2012-04-29 MED ORDER — SODIUM CHLORIDE 0.9 % IV SOLN
Freq: Once | INTRAVENOUS | Status: AC
  Administered 2012-04-29: 10:00:00 via INTRAVENOUS

## 2012-04-29 MED ORDER — BORTEZOMIB CHEMO SQ INJECTION 3.5 MG (2.5MG/ML)
1.3000 mg/m2 | Freq: Once | INTRAMUSCULAR | Status: AC
  Administered 2012-04-29: 2.75 mg via SUBCUTANEOUS
  Filled 2012-04-29: qty 2.75

## 2012-04-29 NOTE — Patient Instructions (Signed)
[facility] Discharge Instructions for Patients Receiving Chemotherapy  Today you received the following chemotherapy agents :  Velcade and Cytoxan.   To help prevent nausea and vomiting after your treatment, we encourage you to take your nausea medication as directed.    If you develop nausea and vomiting that is not controlled by your nausea medication, call the clinic. If it is after clinic hours your family physician or the after hours number for the clinic or go to the Emergency Department.   BELOW ARE SYMPTOMS THAT SHOULD BE REPORTED IMMEDIATELY:  *FEVER GREATER THAN 100.5 F  *CHILLS WITH OR WITHOUT FEVER  NAUSEA AND VOMITING THAT IS NOT CONTROLLED WITH YOUR NAUSEA MEDICATION  *UNUSUAL SHORTNESS OF BREATH  *UNUSUAL BRUISING OR BLEEDING  TENDERNESS IN MOUTH AND THROAT WITH OR WITHOUT PRESENCE OF ULCERS  *URINARY PROBLEMS  *BOWEL PROBLEMS  UNUSUAL RASH Items with * indicate a potential emergency and should be followed up as soon as possible.   Feel free to call the clinic you have any questions or concerns. The clinic phone number is [phone].   I have been informed and understand all the instructions given to me. I know to contact the clinic, my physician, or go to the Emergency Department if any problems should occur. I do not have any questions at this time, but understand that I may call the clinic during office hours   should I have any questions or need assistance in obtaining follow up care.    __________________________________________  _____________  __________ Signature of Patient or Authorized Representative            Date                   Time    __________________________________________ Nurse's Signature

## 2012-05-05 ENCOUNTER — Other Ambulatory Visit: Payer: Self-pay | Admitting: Cardiovascular Disease

## 2012-05-06 ENCOUNTER — Other Ambulatory Visit (HOSPITAL_BASED_OUTPATIENT_CLINIC_OR_DEPARTMENT_OTHER): Payer: Medicare Other | Admitting: Lab

## 2012-05-06 ENCOUNTER — Ambulatory Visit (HOSPITAL_BASED_OUTPATIENT_CLINIC_OR_DEPARTMENT_OTHER): Payer: Medicare Other

## 2012-05-06 VITALS — BP 147/93 | HR 64 | Temp 97.7°F | Resp 18

## 2012-05-06 DIAGNOSIS — Z5111 Encounter for antineoplastic chemotherapy: Secondary | ICD-10-CM

## 2012-05-06 DIAGNOSIS — C8313 Mantle cell lymphoma, intra-abdominal lymph nodes: Secondary | ICD-10-CM

## 2012-05-06 LAB — CBC WITH DIFFERENTIAL/PLATELET
Basophils Absolute: 0 10*3/uL (ref 0.0–0.1)
EOS%: 5.5 % (ref 0.0–7.0)
HGB: 10.9 g/dL — ABNORMAL LOW (ref 13.0–17.1)
MCH: 33.1 pg (ref 27.2–33.4)
MCV: 101.2 fL — ABNORMAL HIGH (ref 79.3–98.0)
MONO%: 15.7 % — ABNORMAL HIGH (ref 0.0–14.0)
RBC: 3.29 10*6/uL — ABNORMAL LOW (ref 4.20–5.82)
RDW: 14.9 % — ABNORMAL HIGH (ref 11.0–14.6)

## 2012-05-06 MED ORDER — BORTEZOMIB CHEMO SQ INJECTION 3.5 MG (2.5MG/ML)
1.3000 mg/m2 | Freq: Once | INTRAMUSCULAR | Status: AC
Start: 2012-05-06 — End: 2012-05-06
  Administered 2012-05-06: 2.75 mg via SUBCUTANEOUS
  Filled 2012-05-06: qty 2.75

## 2012-05-06 MED ORDER — DEXAMETHASONE SODIUM PHOSPHATE 4 MG/ML IJ SOLN
20.0000 mg | Freq: Once | INTRAMUSCULAR | Status: AC
  Administered 2012-05-06: 20 mg via INTRAVENOUS

## 2012-05-06 MED ORDER — SODIUM CHLORIDE 0.9 % IV SOLN
Freq: Once | INTRAVENOUS | Status: AC
  Administered 2012-05-06: 10:00:00 via INTRAVENOUS

## 2012-05-06 MED ORDER — ONDANSETRON 8 MG/50ML IVPB (CHCC)
8.0000 mg | Freq: Once | INTRAVENOUS | Status: AC
  Administered 2012-05-06: 8 mg via INTRAVENOUS

## 2012-05-06 MED ORDER — SODIUM CHLORIDE 0.9 % IV SOLN
190.0000 mg/m2 | Freq: Once | INTRAVENOUS | Status: AC
  Administered 2012-05-06: 400 mg via INTRAVENOUS
  Filled 2012-05-06: qty 20

## 2012-05-06 NOTE — Patient Instructions (Addendum)
[facility] Discharge Instructions for Patients Receiving Chemotherapy  Today you received the following chemotherapy agents Cytoxan/Velcade To help prevent nausea and vomiting after your treatment, we encourage you to take your nausea medication as prescribed.   If you develop nausea and vomiting that is not controlled by your nausea medication, call the clinic. If it is after clinic hours your family physician or the after hours number for the clinic or go to the Emergency Department.   BELOW ARE SYMPTOMS THAT SHOULD BE REPORTED IMMEDIATELY:  *FEVER GREATER THAN 100.5 F  *CHILLS WITH OR WITHOUT FEVER  NAUSEA AND VOMITING THAT IS NOT CONTROLLED WITH YOUR NAUSEA MEDICATION  *UNUSUAL SHORTNESS OF BREATH  *UNUSUAL BRUISING OR BLEEDING  TENDERNESS IN MOUTH AND THROAT WITH OR WITHOUT PRESENCE OF ULCERS  *URINARY PROBLEMS  *BOWEL PROBLEMS  UNUSUAL RASH Items with * indicate a potential emergency and should be followed up as soon as possible.  One of the nurses will contact you 24 hours after your treatment. Please let the nurse know about any problems that you may have experienced. Feel free to call the clinic you have any questions or concerns. The clinic phone number is [phone].   I have been informed and understand all the instructions given to me. I know to contact the clinic, my physician, or go to the Emergency Department if any problems should occur. I do not have any questions at this time, but understand that I may call the clinic during office hours   should I have any questions or need assistance in obtaining follow up care.    __________________________________________  _____________  __________ Signature of Patient or Authorized Representative            Date                   Time    __________________________________________ Nurse's Signature    

## 2012-05-13 ENCOUNTER — Telehealth: Payer: Self-pay | Admitting: Oncology

## 2012-05-13 ENCOUNTER — Ambulatory Visit (HOSPITAL_BASED_OUTPATIENT_CLINIC_OR_DEPARTMENT_OTHER): Payer: Medicare Other

## 2012-05-13 ENCOUNTER — Encounter: Payer: Self-pay | Admitting: Oncology

## 2012-05-13 ENCOUNTER — Ambulatory Visit (HOSPITAL_BASED_OUTPATIENT_CLINIC_OR_DEPARTMENT_OTHER): Payer: Medicare Other | Admitting: Oncology

## 2012-05-13 ENCOUNTER — Other Ambulatory Visit (HOSPITAL_BASED_OUTPATIENT_CLINIC_OR_DEPARTMENT_OTHER): Payer: Medicare Other | Admitting: Lab

## 2012-05-13 ENCOUNTER — Other Ambulatory Visit: Payer: Self-pay

## 2012-05-13 VITALS — BP 153/82 | HR 63 | Temp 97.5°F | Resp 20 | Ht 68.0 in | Wt 209.7 lb

## 2012-05-13 VITALS — BP 166/86 | HR 61 | Temp 98.3°F | Resp 20

## 2012-05-13 DIAGNOSIS — Z5111 Encounter for antineoplastic chemotherapy: Secondary | ICD-10-CM

## 2012-05-13 DIAGNOSIS — C8589 Other specified types of non-Hodgkin lymphoma, extranodal and solid organ sites: Secondary | ICD-10-CM

## 2012-05-13 DIAGNOSIS — C8313 Mantle cell lymphoma, intra-abdominal lymph nodes: Secondary | ICD-10-CM

## 2012-05-13 DIAGNOSIS — Z5112 Encounter for antineoplastic immunotherapy: Secondary | ICD-10-CM

## 2012-05-13 DIAGNOSIS — C37 Malignant neoplasm of thymus: Secondary | ICD-10-CM

## 2012-05-13 DIAGNOSIS — Z23 Encounter for immunization: Secondary | ICD-10-CM

## 2012-05-13 LAB — COMPREHENSIVE METABOLIC PANEL (CC13)
ALT: 13 U/L (ref 0–55)
AST: 19 U/L (ref 5–34)
Albumin: 3.4 g/dL — ABNORMAL LOW (ref 3.5–5.0)
CO2: 25 mEq/L (ref 22–29)
Calcium: 8.7 mg/dL (ref 8.4–10.4)
Chloride: 107 mEq/L (ref 98–107)
Creatinine: 1 mg/dL (ref 0.7–1.3)
Potassium: 4.1 mEq/L (ref 3.5–5.1)
Total Protein: 5.6 g/dL — ABNORMAL LOW (ref 6.4–8.3)

## 2012-05-13 LAB — CBC WITH DIFFERENTIAL/PLATELET
Eosinophils Absolute: 0.3 10*3/uL (ref 0.0–0.5)
HCT: 34.9 % — ABNORMAL LOW (ref 38.4–49.9)
LYMPH%: 22.1 % (ref 14.0–49.0)
MONO#: 0.8 10*3/uL (ref 0.1–0.9)
NEUT#: 3 10*3/uL (ref 1.5–6.5)
Platelets: 145 10*3/uL (ref 140–400)
RBC: 3.45 10*6/uL — ABNORMAL LOW (ref 4.20–5.82)
WBC: 5.3 10*3/uL (ref 4.0–10.3)
lymph#: 1.2 10*3/uL (ref 0.9–3.3)
nRBC: 0 % (ref 0–0)

## 2012-05-13 LAB — LACTATE DEHYDROGENASE (CC13): LDH: 315 U/L — ABNORMAL HIGH (ref 125–220)

## 2012-05-13 MED ORDER — SODIUM CHLORIDE 0.9 % IV SOLN
190.0000 mg/m2 | Freq: Once | INTRAVENOUS | Status: AC
  Administered 2012-05-13: 400 mg via INTRAVENOUS
  Filled 2012-05-13: qty 20

## 2012-05-13 MED ORDER — HEPARIN SOD (PORK) LOCK FLUSH 100 UNIT/ML IV SOLN
500.0000 [IU] | Freq: Once | INTRAVENOUS | Status: AC | PRN
  Administered 2012-05-13: 500 [IU]
  Filled 2012-05-13: qty 5

## 2012-05-13 MED ORDER — DIPHENHYDRAMINE HCL 25 MG PO CAPS
25.0000 mg | ORAL_CAPSULE | Freq: Once | ORAL | Status: AC
  Administered 2012-05-13: 25 mg via ORAL

## 2012-05-13 MED ORDER — ONDANSETRON 8 MG/50ML IVPB (CHCC)
8.0000 mg | Freq: Once | INTRAVENOUS | Status: AC
  Administered 2012-05-13: 8 mg via INTRAVENOUS

## 2012-05-13 MED ORDER — SODIUM CHLORIDE 0.9 % IV SOLN
375.0000 mg/m2 | Freq: Once | INTRAVENOUS | Status: AC
  Administered 2012-05-13: 800 mg via INTRAVENOUS
  Filled 2012-05-13: qty 80

## 2012-05-13 MED ORDER — INFLUENZA VIRUS VACC SPLIT PF IM SUSP
0.5000 mL | Freq: Once | INTRAMUSCULAR | Status: AC
  Administered 2012-05-13: 0.5 mL via INTRAMUSCULAR
  Filled 2012-05-13: qty 0.5

## 2012-05-13 MED ORDER — ACETAMINOPHEN 325 MG PO TABS
650.0000 mg | ORAL_TABLET | Freq: Once | ORAL | Status: AC
  Administered 2012-05-13: 650 mg via ORAL

## 2012-05-13 MED ORDER — BORTEZOMIB CHEMO SQ INJECTION 3.5 MG (2.5MG/ML)
1.3000 mg/m2 | Freq: Once | INTRAMUSCULAR | Status: AC
  Administered 2012-05-13: 2.75 mg via SUBCUTANEOUS
  Filled 2012-05-13: qty 2.75

## 2012-05-13 MED ORDER — SODIUM CHLORIDE 0.9 % IV SOLN
Freq: Once | INTRAVENOUS | Status: AC
  Administered 2012-05-13: 13:00:00 via INTRAVENOUS

## 2012-05-13 MED ORDER — DEXAMETHASONE SODIUM PHOSPHATE 4 MG/ML IJ SOLN
20.0000 mg | Freq: Once | INTRAMUSCULAR | Status: AC
  Administered 2012-05-13: 20 mg via INTRAVENOUS

## 2012-05-13 MED ORDER — SODIUM CHLORIDE 0.9 % IJ SOLN
10.0000 mL | INTRAMUSCULAR | Status: DC | PRN
  Administered 2012-05-13: 10 mL
  Filled 2012-05-13: qty 10

## 2012-05-13 NOTE — Patient Instructions (Addendum)
[facility] Discharge Instructions for Patients Receiving Chemotherapy  Today you received the following chemotherapy agents Rituxan, Cytoxan and Velcade.  To help prevent nausea and vomiting after your treatment, we encourage you to take your nausea medication as prescribed.   If you develop nausea and vomiting that is not controlled by your nausea medication, call the clinic. If it is after clinic hours your family physician or the after hours number for the clinic or go to the Emergency Department.   BELOW ARE SYMPTOMS THAT SHOULD BE REPORTED IMMEDIATELY:  *FEVER GREATER THAN 100.5 F  *CHILLS WITH OR WITHOUT FEVER  NAUSEA AND VOMITING THAT IS NOT CONTROLLED WITH YOUR NAUSEA MEDICATION  *UNUSUAL SHORTNESS OF BREATH  *UNUSUAL BRUISING OR BLEEDING  TENDERNESS IN MOUTH AND THROAT WITH OR WITHOUT PRESENCE OF ULCERS  *URINARY PROBLEMS  *BOWEL PROBLEMS  UNUSUAL RASH Items with * indicate a potential emergency and should be followed up as soon as possible.  One of the nurses will contact you 24 hours after your treatment. Please let the nurse know about any problems that you may have experienced. Feel free to call the clinic you have any questions or concerns. The clinic phone number is [phone].   I have been informed and understand all the instructions given to me. I know to contact the clinic, my physician, or go to the Emergency Department if any problems should occur. I do not have any questions at this time, but understand that I may call the clinic during office hours   should I have any questions or need assistance in obtaining follow up care.    __________________________________________  _____________  __________ Signature of Patient or Authorized Representative            Date                   Time    __________________________________________ Nurse's Signature

## 2012-05-13 NOTE — Progress Notes (Signed)
This office note has been dictated.  #[id number]

## 2012-05-13 NOTE — Progress Notes (Signed)
CC:   Martin Morris, M.D.  PROBLEM LIST:  1. Mantle cell lymphoma diagnosed in March 2008 with positive bone  marrow initially on 12/03/2006 and then negative bone marrow after  treatment in October 2009. As stated, the patient presented with  obstructive liver abnormalities requiring ERCP and non metal stent  placement by Dr. Melvia Morris. The stent was subsequently removed  in October 2008. The patient was treated with 8 cycles of Rituxan,  Cytoxan, vincristine and Decadron in combination with Neulasta from  12/19/2006 through 05/15/2007. The patient then received  maintenance Rituxan from July 16, 2007 through February 01, 2010.  While on Rituxan, he had a recurrence of his disease by CT scan  carried out on 01/24/2010. Martin Morris then received treatment  with bendamustine and Rituxan in combination with Neulasta for 6  cycles from 02/19/2010 through 07/26/2010. We have a prior PET scan from  08/22/2010 and CT scans of chest, abdomen, and pelvis from 01/01/2011 that showed no evidence of disease. He has been off treatment since December 2011 and had been doing well without any symptoms until the CT scan of the abdomen and pelvis on 07/01/2011 showed signs of recurrence. CT scans of abdomen and pelvis with IVC on 09/02/11 show further progression.  Rituxan, subcutaneous Velcade, intravenous Cytoxan and Decadron  were initiated on 10/15/2011. CT scans of the abdomen and pelvis carried out on 01/08/2012 showed a near complete response to therapy.  2. Coronary artery disease, S/P 2 MIs, most recently 03/21/10. Had heart cath on 03/22/10. LVEF is 40-50%.  3. Peripheral vascular disease.  4. Dyslipidemia.  5. Hypertension.  6. History of depression.  7. History of kidney stones.  8. History of sleep apnea.  9. History of colonic polyps.  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 Kathlee Nations Morris, status post radiation treatments.  11.Easy bruising.    12.Right Port-A-Cath placed on 11/20/2011.    MEDICATIONS:  1. Norvasc 2.5 mg daily.  2. Aspirin 81 mg every other day.  3. Atenolol 50 mg daily.  4. Celexa 20 mg daily.  5. Lasix 20 mg as needed. The patient has not taken Lasix now for several months. 6. Imdur 60 mg daily.  7. Plavix 75 mg daily.  8. K-Dur 20 mEq twice a day.  9. Pravachol 80 mg at bedtime.  10.Nitrostat 0.4 mg sublingually. The patient has not needed to use  this.  11.Acyclovir 400 mg b.i.d.  12.Bactrim DS 800/160 mg 1 tablet on Monday, Wednesdays, and Fridays.   Pneumovax was administered 09/06/2011.  Flu shot was administered early December 2012. The patient will receive a flu shot on 05/13/2012.   SMOKING HISTORY: The patient has never smoked.     HISTORY:  Martin Morris was seen today for followup of his relapsed mantle cell lymphoma with original diagnosis going back to March 2008. Martin Morris was accompanied by his wife, Martin Morris.  He was last seen by [place] on 04/08/2012.  He has continued to receive weekly chemotherapy, which has done an excellent job of controlling his disease.  He receives Rituxan every 3 weeks.  This was last given on August 27. He is due for Rituxan today.  The patient has tolerated his treatments extremely well with no problems.  Blood counts have held up well.  No fevers, chills or sweats. Breathing status is stable.  He does have some dyspnea on exertion, some level of fatigue but nothing has changed.  He  continues to have marked swelling of his legs and there was 1 area on his leg that broke down, caused an ulcer with some weeping and some crusting.  I have just learned that the patient has Lasix but does not take it and has not taken it for the last several months.  I urged him to start taking this 20-40 mg every other day and to take this as early in the day as possible so that he is not up all night voiding.  As it is, even when he is not taking the Lasix, he is up at night  frequently urinating.  PHYSICAL EXAMINATION:  There is little change.  Weight is 209.7 pounds, height 5 feet 8 inches, body surface area 2.14 sq m.  Blood pressure 153/82.  Other vital signs are normal.  There is no scleral icterus. Mouth and pharynx are benign.  There is no peripheral adenopathy palpable.  Heart and lungs are normal.  There is a right-sided Port-A- Cath.  Abdomen is basically benign.  There is an area of minimal firmness just above umbilicus associated with a scar.  Extremities:  He has 3+ edema of the legs with pitting.  There is 1 small area about 1-2 cm on his right leg where there is some crusting.  The crust is over an ulcer.  Neurologic:  Exam is normal.  LABORATORY DATA:  Today, white count 5.3, ANC 3.0, hemoglobin 11.6, hematocrit 34.9, platelets 145,000.  Chemistries today are pending. Chemistries from 04/08/2012 were notable for an LDH of 269; on 03/25/2012, LDH was 257;  on 07/27 was 263, on 07/17 was 302.  On 08/14, BUN was 19, creatinine 1.07, albumin 3.7.  IMAGING STUDIES:  1. CT scan of chest, abdomen and pelvis with IV contrast on 01/01/2011  were negative for evidence of recurrent lymphoma.  2. PET scan from 08/22/2010 showed no evidence for recurrent lymphoma.  3. CT scan of chest, abdomen and pelvis with IV contrast on 01/01/2011  showed no evidence for lymphoma.  4. CT scan of abdomen and pelvis on 07/01/2011 showed interval  development of peritoneal disease worrisome for recurrence of  lymphoma. There was new right external iliac lymph node  pathologically enlarged and worrisome for recurrent lymphoma.  There was some stable appearance of mild soft tissue stranding  surrounding the celiac trunk and superior mesenteric artery.  5. Chest x-ray, 2 view, from 09/02/2011 showed no acute findings.  6. CT scan of abdomen and pelvis with IV contrast on 09/02/2011 showed  interval progression of lymphadenopathy in the abdomen and pelvis.  Omental  disease has worsened. The 1.1 x 1.7 cm omental nodule  which was measured on the prior study of 07/01/2011 now measures  4.1 x 2.4 cm. A 2nd discrete soft tissue nodule in the omentum  which previously measured 1.1 x 1.5 cm now measures 2.2 x 3.0 cm.  7. CT scan of abdomen and pelvis with IV contrast on 01/08/2012 showed no residual measurable mass along the anterior aspect of the right diaphragm at the  site of the previously demonstrated 4.9 x 2.1 cm nodule. There is no  residual measurable gastrohepatic or celiac lymphadenopathy. There is  some soft tissue stranding around the proximal abdominal aorta, celiac  trunk and superior mesenteric artery as noted on images 27-34. Pelvic  lymphadenopathy is nearly completely resolved. A right external iliac  node measuring 6 mm on image 66 previously measured 9 mm. Another node  which previously measured 17 mm short axis  now measures 8 mm on image  70. There is no residual inguinal adenopathy. The spleen remains  normal in size, demonstrates no focal abnormalities. There are no  suspect osseous findings. There is no residual mass at the left  posterior costophrenic angle. The final impression was near complete  response to therapy in the lymphadenopathy and omental disease with some  minimal residual measurable disease as previously noted. There was an  enlarged left inguinal hernia containing a knuckle of sigmoid colon with  no evidence for incarceration or bowel obstruction. There was a  nonobstructing left renal calculus.   IMPRESSION AND PLAN:  Martin Morris continues to do extremely well.  We have reviewed Lasix and I have encouraged him to take this, at least start off every other day.  We may need to increase the dose.  I have encouraged him to take this as early in the day as possible.  This may reduce his nocturia.  The patient will receive a flu shot today.  He is scheduled for chemotherapy as before:  Velcade 2.75 mg subcu, Cytoxan  400 mg IV, Decadron 20 mg IV and Rituxan 800 mg IV.  I should mention the patient is not having any neuropathy from the Velcade.  He will continue to come in on a weekly basis for CBC and chemotherapy.  The Rituxan is being given every 3 weeks and will be repeated on October 9th and again when we see the patient on October 30th.  I have also ordered a CT scan of abdomen and pelvis with IV contrast for October 25th, just before his next appointment.  That scan will be compared with the prior scan of 01/08/2012 that had shown a response to treatment when compared with the prior CT scan of abdomen and pelvis carried out on September 02, 2011.    ______________________________ Samul Dada, M.D. DSM/MEDQ  D:  05/13/2012  T:  05/13/2012  Job:  [id number]

## 2012-05-13 NOTE — Telephone Encounter (Signed)
gv wife appt schedule for September thru November including ct for 10/25.

## 2012-05-15 ENCOUNTER — Other Ambulatory Visit: Payer: Self-pay | Admitting: Oncology

## 2012-05-15 DIAGNOSIS — C37 Malignant neoplasm of thymus: Secondary | ICD-10-CM

## 2012-05-15 DIAGNOSIS — C8313 Mantle cell lymphoma, intra-abdominal lymph nodes: Secondary | ICD-10-CM

## 2012-05-20 ENCOUNTER — Other Ambulatory Visit: Payer: Self-pay | Admitting: Oncology

## 2012-05-20 ENCOUNTER — Ambulatory Visit (HOSPITAL_BASED_OUTPATIENT_CLINIC_OR_DEPARTMENT_OTHER): Payer: Medicare Other

## 2012-05-20 ENCOUNTER — Other Ambulatory Visit (HOSPITAL_BASED_OUTPATIENT_CLINIC_OR_DEPARTMENT_OTHER): Payer: Medicare Other | Admitting: Lab

## 2012-05-20 VITALS — BP 149/81 | HR 65 | Temp 98.0°F | Resp 18

## 2012-05-20 DIAGNOSIS — C8313 Mantle cell lymphoma, intra-abdominal lymph nodes: Secondary | ICD-10-CM

## 2012-05-20 DIAGNOSIS — Z5111 Encounter for antineoplastic chemotherapy: Secondary | ICD-10-CM

## 2012-05-20 DIAGNOSIS — Z5112 Encounter for antineoplastic immunotherapy: Secondary | ICD-10-CM

## 2012-05-20 LAB — CBC WITH DIFFERENTIAL/PLATELET
BASO%: 0.2 % (ref 0.0–2.0)
Basophils Absolute: 0 10*3/uL (ref 0.0–0.1)
Eosinophils Absolute: 0.3 10*3/uL (ref 0.0–0.5)
HCT: 34.4 % — ABNORMAL LOW (ref 38.4–49.9)
HGB: 11.7 g/dL — ABNORMAL LOW (ref 13.0–17.1)
LYMPH%: 22.6 % (ref 14.0–49.0)
MCHC: 34 g/dL (ref 32.0–36.0)
MONO#: 1 10*3/uL — ABNORMAL HIGH (ref 0.1–0.9)
NEUT%: 56.2 % (ref 39.0–75.0)
Platelets: 154 10*3/uL (ref 140–400)
WBC: 6.5 10*3/uL (ref 4.0–10.3)

## 2012-05-20 MED ORDER — ONDANSETRON 8 MG/50ML IVPB (CHCC)
8.0000 mg | Freq: Once | INTRAVENOUS | Status: AC
  Administered 2012-05-20: 8 mg via INTRAVENOUS

## 2012-05-20 MED ORDER — HEPARIN SOD (PORK) LOCK FLUSH 100 UNIT/ML IV SOLN
500.0000 [IU] | Freq: Once | INTRAVENOUS | Status: AC | PRN
  Administered 2012-05-20: 500 [IU]
  Filled 2012-05-20: qty 5

## 2012-05-20 MED ORDER — SODIUM CHLORIDE 0.9 % IV SOLN
190.0000 mg/m2 | Freq: Once | INTRAVENOUS | Status: AC
  Administered 2012-05-20: 400 mg via INTRAVENOUS
  Filled 2012-05-20: qty 20

## 2012-05-20 MED ORDER — SODIUM CHLORIDE 0.9 % IJ SOLN
10.0000 mL | INTRAMUSCULAR | Status: DC | PRN
  Administered 2012-05-20: 10 mL
  Filled 2012-05-20: qty 10

## 2012-05-20 MED ORDER — DEXAMETHASONE SODIUM PHOSPHATE 4 MG/ML IJ SOLN
20.0000 mg | Freq: Once | INTRAMUSCULAR | Status: AC
  Administered 2012-05-20: 20 mg via INTRAVENOUS

## 2012-05-20 MED ORDER — BORTEZOMIB CHEMO SQ INJECTION 3.5 MG (2.5MG/ML)
1.3000 mg/m2 | Freq: Once | INTRAMUSCULAR | Status: AC
  Administered 2012-05-20: 2.75 mg via SUBCUTANEOUS
  Filled 2012-05-20: qty 1.1

## 2012-05-20 MED ORDER — SODIUM CHLORIDE 0.9 % IV SOLN
Freq: Once | INTRAVENOUS | Status: AC
  Administered 2012-05-20: 15:00:00 via INTRAVENOUS

## 2012-05-20 NOTE — Patient Instructions (Signed)
[facility] Discharge Instructions for Patients Receiving Chemotherapy  Today you received the following chemotherapy agents Velcade and Cytoxan  To help prevent nausea and vomiting after your treatment, we encourage you to take your nausea medication as prescribed  If you develop nausea and vomiting that is not controlled by your nausea medication, call the clinic. If it is after clinic hours your family physician or the after hours number for the clinic or go to the Emergency Department.   BELOW ARE SYMPTOMS THAT SHOULD BE REPORTED IMMEDIATELY:  *FEVER GREATER THAN 100.5 F  *CHILLS WITH OR WITHOUT FEVER  NAUSEA AND VOMITING THAT IS NOT CONTROLLED WITH YOUR NAUSEA MEDICATION  *UNUSUAL SHORTNESS OF BREATH  *UNUSUAL BRUISING OR BLEEDING  TENDERNESS IN MOUTH AND THROAT WITH OR WITHOUT PRESENCE OF ULCERS  *URINARY PROBLEMS  *BOWEL PROBLEMS  UNUSUAL RASH Items with * indicate a potential emergency and should be followed up as soon as possible.  One of the nurses will contact you 24 hours after your treatment. Please let the nurse know about any problems that you may have experienced. Feel free to call the clinic you have any questions or concerns. The clinic phone number is [phone].   I have been informed and understand all the instructions given to me. I know to contact the clinic, my physician, or go to the Emergency Department if any problems should occur. I do not have any questions at this time, but understand that I may call the clinic during office hours   should I have any questions or need assistance in obtaining follow up care.    __________________________________________  _____________  __________ Signature of Patient or Authorized Representative            Date                   Time    __________________________________________ Nurse's Signature    

## 2012-05-27 ENCOUNTER — Ambulatory Visit (HOSPITAL_BASED_OUTPATIENT_CLINIC_OR_DEPARTMENT_OTHER): Payer: Medicare Other

## 2012-05-27 ENCOUNTER — Other Ambulatory Visit (HOSPITAL_BASED_OUTPATIENT_CLINIC_OR_DEPARTMENT_OTHER): Payer: Medicare Other | Admitting: Lab

## 2012-05-27 VITALS — BP 148/78 | HR 68 | Temp 97.5°F | Resp 18

## 2012-05-27 DIAGNOSIS — C8313 Mantle cell lymphoma, intra-abdominal lymph nodes: Secondary | ICD-10-CM

## 2012-05-27 DIAGNOSIS — Z5112 Encounter for antineoplastic immunotherapy: Secondary | ICD-10-CM

## 2012-05-27 LAB — CBC WITH DIFFERENTIAL/PLATELET
BASO%: 0.4 % (ref 0.0–2.0)
EOS%: 4.5 % (ref 0.0–7.0)
LYMPH%: 21.1 % (ref 14.0–49.0)
MCHC: 33.3 g/dL (ref 32.0–36.0)
MONO#: 0.8 10*3/uL (ref 0.1–0.9)
RBC: 3.59 10*6/uL — ABNORMAL LOW (ref 4.20–5.82)
WBC: 5.1 10*3/uL (ref 4.0–10.3)
lymph#: 1.1 10*3/uL (ref 0.9–3.3)
nRBC: 0 % (ref 0–0)

## 2012-05-27 MED ORDER — DEXAMETHASONE SODIUM PHOSPHATE 4 MG/ML IJ SOLN
20.0000 mg | Freq: Once | INTRAMUSCULAR | Status: AC
  Administered 2012-05-27: 20 mg via INTRAVENOUS

## 2012-05-27 MED ORDER — SODIUM CHLORIDE 0.9 % IV SOLN
190.0000 mg/m2 | Freq: Once | INTRAVENOUS | Status: AC
  Administered 2012-05-27: 400 mg via INTRAVENOUS
  Filled 2012-05-27: qty 20

## 2012-05-27 MED ORDER — BORTEZOMIB CHEMO SQ INJECTION 3.5 MG (2.5MG/ML)
1.3000 mg/m2 | Freq: Once | INTRAMUSCULAR | Status: AC
  Administered 2012-05-27: 2.75 mg via SUBCUTANEOUS
  Filled 2012-05-27: qty 2.75

## 2012-05-27 MED ORDER — ONDANSETRON 8 MG/50ML IVPB (CHCC)
8.0000 mg | Freq: Once | INTRAVENOUS | Status: AC
  Administered 2012-05-27: 8 mg via INTRAVENOUS

## 2012-05-27 NOTE — Patient Instructions (Addendum)
[facility] Discharge Instructions for Patients Receiving Chemotherapy  Today you received the following chemotherapy agents: Velcade, Cytoxan.  To help prevent nausea and vomiting after your treatment, we encourage you to take your nausea medication.  If you develop nausea and vomiting that is not controlled by your nausea medication, call the clinic.   BELOW ARE SYMPTOMS THAT SHOULD BE REPORTED IMMEDIATELY:  *FEVER GREATER THAN 100.5 F  *CHILLS WITH OR WITHOUT FEVER  NAUSEA AND VOMITING THAT IS NOT CONTROLLED WITH YOUR NAUSEA MEDICATION  *UNUSUAL SHORTNESS OF BREATH  *UNUSUAL BRUISING OR BLEEDING  TENDERNESS IN MOUTH AND THROAT WITH OR WITHOUT PRESENCE OF ULCERS  *URINARY PROBLEMS  *BOWEL PROBLEMS  UNUSUAL RASH Items with * indicate a potential emergency and should be followed up as soon as possible.  Feel free to call the clinic you have any questions or concerns. The clinic phone number is [phone].    

## 2012-06-03 ENCOUNTER — Ambulatory Visit (HOSPITAL_BASED_OUTPATIENT_CLINIC_OR_DEPARTMENT_OTHER): Payer: Medicare Other

## 2012-06-03 ENCOUNTER — Other Ambulatory Visit (HOSPITAL_BASED_OUTPATIENT_CLINIC_OR_DEPARTMENT_OTHER): Payer: Medicare Other | Admitting: Lab

## 2012-06-03 VITALS — BP 150/78 | HR 67 | Temp 97.8°F | Resp 20

## 2012-06-03 DIAGNOSIS — Z5112 Encounter for antineoplastic immunotherapy: Secondary | ICD-10-CM

## 2012-06-03 DIAGNOSIS — C8313 Mantle cell lymphoma, intra-abdominal lymph nodes: Secondary | ICD-10-CM

## 2012-06-03 DIAGNOSIS — Z5111 Encounter for antineoplastic chemotherapy: Secondary | ICD-10-CM

## 2012-06-03 LAB — CBC WITH DIFFERENTIAL/PLATELET
Basophils Absolute: 0 10*3/uL (ref 0.0–0.1)
EOS%: 6.1 % (ref 0.0–7.0)
HGB: 12.4 g/dL — ABNORMAL LOW (ref 13.0–17.1)
MCH: 35.4 pg — ABNORMAL HIGH (ref 27.2–33.4)
NEUT#: 3.2 10*3/uL (ref 1.5–6.5)
RBC: 3.49 10*6/uL — ABNORMAL LOW (ref 4.20–5.82)
RDW: 15.5 % — ABNORMAL HIGH (ref 11.0–14.6)
lymph#: 0.8 10*3/uL — ABNORMAL LOW (ref 0.9–3.3)

## 2012-06-03 MED ORDER — BORTEZOMIB CHEMO SQ INJECTION 3.5 MG (2.5MG/ML)
1.3000 mg/m2 | Freq: Once | INTRAMUSCULAR | Status: AC
  Administered 2012-06-03: 2.75 mg via SUBCUTANEOUS
  Filled 2012-06-03: qty 2.75

## 2012-06-03 MED ORDER — ONDANSETRON 8 MG/50ML IVPB (CHCC)
8.0000 mg | Freq: Once | INTRAVENOUS | Status: AC
  Administered 2012-06-03: 8 mg via INTRAVENOUS

## 2012-06-03 MED ORDER — DEXAMETHASONE SODIUM PHOSPHATE 4 MG/ML IJ SOLN
20.0000 mg | Freq: Once | INTRAMUSCULAR | Status: AC
  Administered 2012-06-03: 20 mg via INTRAVENOUS

## 2012-06-03 MED ORDER — DIPHENHYDRAMINE HCL 25 MG PO CAPS
25.0000 mg | ORAL_CAPSULE | Freq: Once | ORAL | Status: AC
  Administered 2012-06-03: 25 mg via ORAL

## 2012-06-03 MED ORDER — ACETAMINOPHEN 325 MG PO TABS
650.0000 mg | ORAL_TABLET | Freq: Once | ORAL | Status: AC
  Administered 2012-06-03: 650 mg via ORAL

## 2012-06-03 MED ORDER — HEPARIN SOD (PORK) LOCK FLUSH 100 UNIT/ML IV SOLN
500.0000 [IU] | Freq: Once | INTRAVENOUS | Status: AC | PRN
  Administered 2012-06-03: 500 [IU]
  Filled 2012-06-03: qty 5

## 2012-06-03 MED ORDER — SODIUM CHLORIDE 0.9 % IV SOLN
Freq: Once | INTRAVENOUS | Status: AC
  Administered 2012-06-03: 11:00:00 via INTRAVENOUS

## 2012-06-03 MED ORDER — SODIUM CHLORIDE 0.9 % IJ SOLN
10.0000 mL | INTRAMUSCULAR | Status: DC | PRN
  Administered 2012-06-03: 10 mL
  Filled 2012-06-03: qty 10

## 2012-06-03 MED ORDER — CYCLOPHOSPHAMIDE CHEMO INJECTION 1 GM
190.0000 mg/m2 | Freq: Once | INTRAMUSCULAR | Status: AC
  Administered 2012-06-03: 400 mg via INTRAVENOUS
  Filled 2012-06-03: qty 20

## 2012-06-03 MED ORDER — SODIUM CHLORIDE 0.9 % IV SOLN
375.0000 mg/m2 | Freq: Once | INTRAVENOUS | Status: AC
  Administered 2012-06-03: 800 mg via INTRAVENOUS
  Filled 2012-06-03: qty 80

## 2012-06-08 ENCOUNTER — Other Ambulatory Visit: Payer: Self-pay | Admitting: Cardiology

## 2012-06-09 NOTE — Telephone Encounter (Signed)
..   Requested Prescriptions   Pending Prescriptions Disp Refills  . pravastatin (PRAVACHOL) 40 MG tablet [Pharmacy Med Name: PRAVASTATIN 40MG     TAB] 60 tablet 5    Sig: TAKE TWO TABLETS BY MOUTH AT BEDTIME

## 2012-06-10 ENCOUNTER — Ambulatory Visit (HOSPITAL_BASED_OUTPATIENT_CLINIC_OR_DEPARTMENT_OTHER): Payer: Medicare Other

## 2012-06-10 ENCOUNTER — Other Ambulatory Visit (HOSPITAL_BASED_OUTPATIENT_CLINIC_OR_DEPARTMENT_OTHER): Payer: Medicare Other | Admitting: Lab

## 2012-06-10 VITALS — BP 138/71 | HR 57 | Temp 98.1°F | Resp 20

## 2012-06-10 DIAGNOSIS — Z5111 Encounter for antineoplastic chemotherapy: Secondary | ICD-10-CM

## 2012-06-10 DIAGNOSIS — Z5112 Encounter for antineoplastic immunotherapy: Secondary | ICD-10-CM

## 2012-06-10 DIAGNOSIS — C8313 Mantle cell lymphoma, intra-abdominal lymph nodes: Secondary | ICD-10-CM

## 2012-06-10 LAB — CBC WITH DIFFERENTIAL/PLATELET
BASO%: 0.5 % (ref 0.0–2.0)
EOS%: 6 % (ref 0.0–7.0)
HCT: 35.8 % — ABNORMAL LOW (ref 38.4–49.9)
MCH: 34.1 pg — ABNORMAL HIGH (ref 27.2–33.4)
MCHC: 33 g/dL (ref 32.0–36.0)
NEUT%: 52.5 % (ref 39.0–75.0)
RBC: 3.46 10*6/uL — ABNORMAL LOW (ref 4.20–5.82)
lymph#: 1.5 10*3/uL (ref 0.9–3.3)

## 2012-06-10 LAB — TECHNOLOGIST REVIEW

## 2012-06-10 MED ORDER — BORTEZOMIB CHEMO SQ INJECTION 3.5 MG (2.5MG/ML)
1.3000 mg/m2 | Freq: Once | INTRAMUSCULAR | Status: AC
  Administered 2012-06-10: 2.75 mg via SUBCUTANEOUS
  Filled 2012-06-10: qty 2.75

## 2012-06-10 MED ORDER — DEXAMETHASONE SODIUM PHOSPHATE 4 MG/ML IJ SOLN
20.0000 mg | Freq: Once | INTRAMUSCULAR | Status: AC
  Administered 2012-06-10: 20 mg via INTRAVENOUS

## 2012-06-10 MED ORDER — SODIUM CHLORIDE 0.9 % IV SOLN
Freq: Once | INTRAVENOUS | Status: AC
  Administered 2012-06-10: 15:00:00 via INTRAVENOUS

## 2012-06-10 MED ORDER — SODIUM CHLORIDE 0.9 % IJ SOLN
10.0000 mL | INTRAMUSCULAR | Status: DC | PRN
  Administered 2012-06-10: 10 mL
  Filled 2012-06-10: qty 10

## 2012-06-10 MED ORDER — HEPARIN SOD (PORK) LOCK FLUSH 100 UNIT/ML IV SOLN
500.0000 [IU] | Freq: Once | INTRAVENOUS | Status: AC | PRN
  Administered 2012-06-10: 500 [IU]
  Filled 2012-06-10: qty 5

## 2012-06-10 MED ORDER — SODIUM CHLORIDE 0.9 % IV SOLN
190.0000 mg/m2 | Freq: Once | INTRAVENOUS | Status: AC
  Administered 2012-06-10: 400 mg via INTRAVENOUS
  Filled 2012-06-10: qty 20

## 2012-06-10 MED ORDER — ONDANSETRON 8 MG/50ML IVPB (CHCC)
8.0000 mg | Freq: Once | INTRAVENOUS | Status: AC
  Administered 2012-06-10: 8 mg via INTRAVENOUS

## 2012-06-10 NOTE — Patient Instructions (Addendum)
[facility] Discharge Instructions for Patients Receiving Chemotherapy  Today you received the following chemotherapy agents velcade/cytoxan  To help prevent nausea and vomiting after your treatment, we encourage you to take your nausea medication and take it as often as [id number]} hours.   If you develop nausea and vomiting that is not controlled by your nausea medication, call the clinic. If it is after clinic hours your family physician or the after hours number for the clinic or go to the Emergency Department.   BELOW ARE SYMPTOMS THAT SHOULD BE REPORTED IMMEDIATELY:  *FEVER GREATER THAN 100.5 F  *CHILLS WITH OR WITHOUT FEVER  NAUSEA AND VOMITING THAT IS NOT CONTROLLED WITH YOUR NAUSEA MEDICATION  *UNUSUAL SHORTNESS OF BREATH  *UNUSUAL BRUISING OR BLEEDING  TENDERNESS IN MOUTH AND THROAT WITH OR WITHOUT PRESENCE OF ULCERS  *URINARY PROBLEMS  *BOWEL PROBLEMS  UNUSUAL RASH Items with * indicate a potential emergency and should be followed up as soon as possible.  One of the nurses will contact you 24 hours after your treatment. Please let the nurse know about any problems that you may have experienced. Feel free to call the clinic you have any questions or concerns. The clinic phone number is [phone].   I have been informed and understand all the instructions given to me. I know to contact the clinic, my physician, or go to the Emergency Department if any problems should occur. I do not have any questions at this time, but understand that I may call the clinic during office hours   should I have any questions or need assistance in obtaining follow up care.    __________________________________________  _____________  __________ Signature of Patient or Authorized Representative            Date                   Time    __________________________________________ Nurse's Signature

## 2012-06-17 ENCOUNTER — Ambulatory Visit (HOSPITAL_BASED_OUTPATIENT_CLINIC_OR_DEPARTMENT_OTHER): Payer: Medicare Other

## 2012-06-17 ENCOUNTER — Other Ambulatory Visit (HOSPITAL_BASED_OUTPATIENT_CLINIC_OR_DEPARTMENT_OTHER): Payer: Medicare Other | Admitting: Lab

## 2012-06-17 VITALS — BP 134/63 | HR 65 | Temp 97.5°F

## 2012-06-17 DIAGNOSIS — Z5111 Encounter for antineoplastic chemotherapy: Secondary | ICD-10-CM

## 2012-06-17 DIAGNOSIS — Z5112 Encounter for antineoplastic immunotherapy: Secondary | ICD-10-CM

## 2012-06-17 DIAGNOSIS — C8313 Mantle cell lymphoma, intra-abdominal lymph nodes: Secondary | ICD-10-CM

## 2012-06-17 LAB — CBC WITH DIFFERENTIAL/PLATELET
BASO%: 0.4 % (ref 0.0–2.0)
Eosinophils Absolute: 0.4 10*3/uL (ref 0.0–0.5)
HCT: 33.8 % — ABNORMAL LOW (ref 38.4–49.9)
HGB: 11.2 g/dL — ABNORMAL LOW (ref 13.0–17.1)
MCHC: 33.1 g/dL (ref 32.0–36.0)
MONO#: 0.8 10*3/uL (ref 0.1–0.9)
NEUT#: 3.1 10*3/uL (ref 1.5–6.5)
NEUT%: 56.6 % (ref 39.0–75.0)
WBC: 5.5 10*3/uL (ref 4.0–10.3)
lymph#: 1.2 10*3/uL (ref 0.9–3.3)

## 2012-06-17 LAB — TECHNOLOGIST REVIEW

## 2012-06-17 MED ORDER — SODIUM CHLORIDE 0.9 % IV SOLN
190.0000 mg/m2 | Freq: Once | INTRAVENOUS | Status: AC
  Administered 2012-06-17: 400 mg via INTRAVENOUS
  Filled 2012-06-17: qty 20

## 2012-06-17 MED ORDER — SODIUM CHLORIDE 0.9 % IV SOLN
Freq: Once | INTRAVENOUS | Status: AC
  Administered 2012-06-17: 16:00:00 via INTRAVENOUS

## 2012-06-17 MED ORDER — HEPARIN SOD (PORK) LOCK FLUSH 100 UNIT/ML IV SOLN
500.0000 [IU] | Freq: Once | INTRAVENOUS | Status: AC | PRN
  Administered 2012-06-17: 500 [IU]
  Filled 2012-06-17: qty 5

## 2012-06-17 MED ORDER — DEXAMETHASONE SODIUM PHOSPHATE 4 MG/ML IJ SOLN
20.0000 mg | Freq: Once | INTRAMUSCULAR | Status: AC
  Administered 2012-06-17: 20 mg via INTRAVENOUS

## 2012-06-17 MED ORDER — SODIUM CHLORIDE 0.9 % IJ SOLN
10.0000 mL | INTRAMUSCULAR | Status: DC | PRN
  Administered 2012-06-17: 10 mL
  Filled 2012-06-17: qty 10

## 2012-06-17 MED ORDER — ONDANSETRON 8 MG/50ML IVPB (CHCC)
8.0000 mg | Freq: Once | INTRAVENOUS | Status: AC
  Administered 2012-06-17: 8 mg via INTRAVENOUS

## 2012-06-17 MED ORDER — BORTEZOMIB CHEMO SQ INJECTION 3.5 MG (2.5MG/ML)
1.3000 mg/m2 | Freq: Once | INTRAMUSCULAR | Status: AC
  Administered 2012-06-17: 2.75 mg via SUBCUTANEOUS
  Filled 2012-06-17: qty 2.75

## 2012-06-17 NOTE — Patient Instructions (Addendum)
[facility] Discharge Instructions for Patients Receiving Chemotherapy  Today you received the following chemotherapy agents cytoxan and velcade  To help prevent nausea and vomiting after your treatment, we encourage you to take your nausea medication. Begin taking it at  tonight and take it as often as prescribed.   If you develop nausea and vomiting that is not controlled by your nausea medication, call the clinic. If it is after clinic hours your family physician or the after hours number for the clinic or go to the Emergency Department.   BELOW ARE SYMPTOMS THAT SHOULD BE REPORTED IMMEDIATELY:  *FEVER GREATER THAN 100.5 F  *CHILLS WITH OR WITHOUT FEVER  NAUSEA AND VOMITING THAT IS NOT CONTROLLED WITH YOUR NAUSEA MEDICATION  *UNUSUAL SHORTNESS OF BREATH  *UNUSUAL BRUISING OR BLEEDING  TENDERNESS IN MOUTH AND THROAT WITH OR WITHOUT PRESENCE OF ULCERS  *URINARY PROBLEMS  *BOWEL PROBLEMS  UNUSUAL RASH Items with * indicate a potential emergency and should be followed up as soon as possible.  Feel free to call the clinic you have any questions or concerns. The clinic phone number is [phone].   I have been informed and understand all the instructions given to me. I know to contact the clinic, my physician, or go to the Emergency Department if any problems should occur. I do not have any questions at this time, but understand that I may call the clinic during office hours   should I have any questions or need assistance in obtaining follow up care.    __________________________________________  _____________  __________ Signature of Patient or Authorized Representative            Date                   Time    __________________________________________ Nurse's Signature

## 2012-06-19 ENCOUNTER — Ambulatory Visit (HOSPITAL_COMMUNITY)
Admission: RE | Admit: 2012-06-19 | Discharge: 2012-06-19 | Disposition: A | Discharge status: Home / Self Care | Payer: PRIVATE HEALTH INSURANCE | Source: Ambulatory Visit | Attending: Oncology | Admitting: Oncology

## 2012-06-19 DIAGNOSIS — C8589 Other specified types of non-Hodgkin lymphoma, extranodal and solid organ sites: Secondary | ICD-10-CM | POA: Insufficient documentation

## 2012-06-19 DIAGNOSIS — C8313 Mantle cell lymphoma, intra-abdominal lymph nodes: Secondary | ICD-10-CM

## 2012-06-19 DIAGNOSIS — Z09 Encounter for follow-up examination after completed treatment for conditions other than malignant neoplasm: Secondary | ICD-10-CM | POA: Insufficient documentation

## 2012-06-19 MED ORDER — IOHEXOL 300 MG/ML  SOLN
100.0000 mL | Freq: Once | INTRAMUSCULAR | Status: AC | PRN
  Administered 2012-06-19: 100 mL via INTRAVENOUS

## 2012-06-24 ENCOUNTER — Ambulatory Visit (HOSPITAL_BASED_OUTPATIENT_CLINIC_OR_DEPARTMENT_OTHER): Payer: Medicare Other

## 2012-06-24 ENCOUNTER — Telehealth: Payer: Self-pay | Admitting: Oncology

## 2012-06-24 ENCOUNTER — Telehealth: Payer: Self-pay | Admitting: *Deleted

## 2012-06-24 ENCOUNTER — Encounter: Payer: Self-pay | Admitting: Oncology

## 2012-06-24 ENCOUNTER — Other Ambulatory Visit (HOSPITAL_BASED_OUTPATIENT_CLINIC_OR_DEPARTMENT_OTHER): Payer: Medicare Other | Admitting: Lab

## 2012-06-24 ENCOUNTER — Ambulatory Visit (HOSPITAL_BASED_OUTPATIENT_CLINIC_OR_DEPARTMENT_OTHER): Payer: Medicare Other | Admitting: Oncology

## 2012-06-24 ENCOUNTER — Other Ambulatory Visit: Payer: Self-pay | Admitting: Medical Oncology

## 2012-06-24 VITALS — BP 149/75 | HR 56 | Temp 97.9°F | Resp 18

## 2012-06-24 VITALS — BP 145/75 | HR 71 | Temp 97.9°F | Resp 20 | Ht 68.0 in | Wt 201.3 lb

## 2012-06-24 DIAGNOSIS — C8313 Mantle cell lymphoma, intra-abdominal lymph nodes: Secondary | ICD-10-CM

## 2012-06-24 DIAGNOSIS — Z5112 Encounter for antineoplastic immunotherapy: Secondary | ICD-10-CM

## 2012-06-24 DIAGNOSIS — Z5111 Encounter for antineoplastic chemotherapy: Secondary | ICD-10-CM

## 2012-06-24 LAB — CBC WITH DIFFERENTIAL/PLATELET
BASO%: 0.4 % (ref 0.0–2.0)
EOS%: 4.9 % (ref 0.0–7.0)
Eosinophils Absolute: 0.2 10*3/uL (ref 0.0–0.5)
MCHC: 33.7 g/dL (ref 32.0–36.0)
MCV: 101.7 fL — ABNORMAL HIGH (ref 79.3–98.0)
MONO%: 15.5 % — ABNORMAL HIGH (ref 0.0–14.0)
NEUT#: 2.6 10*3/uL (ref 1.5–6.5)
RBC: 3.53 10*6/uL — ABNORMAL LOW (ref 4.20–5.82)
RDW: 13.8 % (ref 11.0–14.6)
nRBC: 0 % (ref 0–0)

## 2012-06-24 LAB — COMPREHENSIVE METABOLIC PANEL (CC13)
ALT: 12 U/L (ref 0–55)
AST: 19 U/L (ref 5–34)
Alkaline Phosphatase: 59 U/L (ref 40–150)
CO2: 29 mEq/L (ref 22–29)
Sodium: 140 mEq/L (ref 136–145)
Total Bilirubin: 1.02 mg/dL (ref 0.20–1.20)
Total Protein: 6 g/dL — ABNORMAL LOW (ref 6.4–8.3)

## 2012-06-24 MED ORDER — ACETAMINOPHEN 325 MG PO TABS
650.0000 mg | ORAL_TABLET | Freq: Once | ORAL | Status: AC
  Administered 2012-06-24: 650 mg via ORAL

## 2012-06-24 MED ORDER — DIPHENHYDRAMINE HCL 25 MG PO CAPS
25.0000 mg | ORAL_CAPSULE | Freq: Once | ORAL | Status: AC
  Administered 2012-06-24: 25 mg via ORAL

## 2012-06-24 MED ORDER — SODIUM CHLORIDE 0.9 % IV SOLN
375.0000 mg/m2 | Freq: Once | INTRAVENOUS | Status: AC
  Administered 2012-06-24: 800 mg via INTRAVENOUS
  Filled 2012-06-24: qty 80

## 2012-06-24 MED ORDER — HEPARIN SOD (PORK) LOCK FLUSH 100 UNIT/ML IV SOLN
500.0000 [IU] | Freq: Once | INTRAVENOUS | Status: AC | PRN
  Administered 2012-06-24: 500 [IU]
  Filled 2012-06-24: qty 5

## 2012-06-24 MED ORDER — SODIUM CHLORIDE 0.9 % IV SOLN
Freq: Once | INTRAVENOUS | Status: AC
  Administered 2012-06-24: 11:00:00 via INTRAVENOUS

## 2012-06-24 MED ORDER — BORTEZOMIB CHEMO SQ INJECTION 3.5 MG (2.5MG/ML)
1.3000 mg/m2 | Freq: Once | INTRAMUSCULAR | Status: AC
Start: 2012-06-24 — End: 2012-06-24
  Administered 2012-06-24: 2.75 mg via SUBCUTANEOUS
  Filled 2012-06-24: qty 2.75

## 2012-06-24 MED ORDER — DEXAMETHASONE SODIUM PHOSPHATE 4 MG/ML IJ SOLN: 20.0000 mg | Freq: Once | INTRAMUSCULAR | Status: DC

## 2012-06-24 MED ORDER — SODIUM CHLORIDE 0.9 % IJ SOLN
10.0000 mL | INTRAMUSCULAR | Status: DC | PRN
  Administered 2012-06-24: 10 mL
  Filled 2012-06-24: qty 10

## 2012-06-24 MED ORDER — SODIUM CHLORIDE 0.9 % IV SOLN: Freq: Once | INTRAVENOUS | Status: AC

## 2012-06-24 MED ORDER — SODIUM CHLORIDE 0.9 % IV SOLN
190.0000 mg/m2 | Freq: Once | INTRAVENOUS | Status: AC
  Administered 2012-06-24: 400 mg via INTRAVENOUS
  Filled 2012-06-24: qty 20

## 2012-06-24 MED ORDER — ONDANSETRON 8 MG/50ML IVPB (CHCC)
8.0000 mg | Freq: Once | INTRAVENOUS | Status: AC
  Administered 2012-06-24: 8 mg via INTRAVENOUS

## 2012-06-24 NOTE — Telephone Encounter (Signed)
Per staff message and POF I have scheduled appts.  JMW  

## 2012-06-24 NOTE — Telephone Encounter (Signed)
Talked to patient they have calendar appt until December 2014

## 2012-06-24 NOTE — Patient Instructions (Addendum)
[facility] Discharge Instructions for Patients Receiving Chemotherapy  Today you received the following chemotherapy agents Rituxan/Cytoxan/Velcade To help prevent nausea and vomiting after your treatment, we encourage you to take your nausea medication as prescribed.  If you develop nausea and vomiting that is not controlled by your nausea medication, call the clinic. If it is after clinic hours your family physician or the after hours number for the clinic or go to the Emergency Department.   BELOW ARE SYMPTOMS THAT SHOULD BE REPORTED IMMEDIATELY:  *FEVER GREATER THAN 100.5 F  *CHILLS WITH OR WITHOUT FEVER  NAUSEA AND VOMITING THAT IS NOT CONTROLLED WITH YOUR NAUSEA MEDICATION  *UNUSUAL SHORTNESS OF BREATH  *UNUSUAL BRUISING OR BLEEDING  TENDERNESS IN MOUTH AND THROAT WITH OR WITHOUT PRESENCE OF ULCERS  *URINARY PROBLEMS  *BOWEL PROBLEMS  UNUSUAL RASH Items with * indicate a potential emergency and should be followed up as soon as possible.  One of the nurses will contact you 24 hours after your treatment. Please let the nurse know about any problems that you may have experienced. Feel free to call the clinic you have any questions or concerns. The clinic phone number is [phone].   I have been informed and understand all the instructions given to me. I know to contact the clinic, my physician, or go to the Emergency Department if any problems should occur. I do not have any questions at this time, but understand that I may call the clinic during office hours   should I have any questions or need assistance in obtaining follow up care.    __________________________________________  _____________  __________ Signature of Patient or Authorized Representative            Date                   Time    __________________________________________ Nurse's Signature    

## 2012-06-24 NOTE — Patient Instructions (Signed)
Next treatments need to be scheduled for November 8, 18,  25 and December 2, 9 and 16.  You will always need blood work before each treatment.  Next appointment should be 08/10/12.

## 2012-06-24 NOTE — Progress Notes (Signed)
This office note has been dictated.  #[id number]

## 2012-06-24 NOTE — Telephone Encounter (Signed)
Scheduled patient for December for lab and MD, emailed Marcelino Duster regarding chemo that is being moved due to holiday, pt will get calendar from [place] @ chemo room

## 2012-06-25 ENCOUNTER — Encounter: Payer: Self-pay | Admitting: *Deleted

## 2012-06-25 NOTE — Progress Notes (Signed)
CSW received referral from Felicita Gage to complete advance directives. CSW contacted patient and plan to meet with patient and spouse in infusion room on 07/13/12 to complete advance directives.  Kathrin Penner, MSW, LCSW Clinical Social Worker [facility] [phone]

## 2012-06-25 NOTE — Progress Notes (Signed)
CC:   [name], M.D.   PROBLEM LIST:  1. Mantle cell lymphoma diagnosed in March 2008 with positive bone  marrow initially on 12/03/2006 and then negative bone marrow after  treatment in October 2009. As stated, the patient presented with  obstructive liver abnormalities requiring ERCP and non metal stent  placement by Dr. Melvia Heaps. The stent was subsequently removed  in October 2008. The patient was treated with 8 cycles of Rituxan,  Cytoxan, vincristine and Decadron in combination with Neulasta from  12/19/2006 through 05/15/2007. The patient then received  maintenance Rituxan from July 16, 2007 through February 01, 2010.  While on Rituxan, he had a recurrence of his disease by CT scan  carried out on 01/24/2010. Mr. Oswald then received treatment  with bendamustine and Rituxan in combination with Neulasta for 6  cycles from 02/19/2010 through 07/26/2010. We have a prior PET scan from  08/22/2010 and CT scans of chest, abdomen, and pelvis from 01/01/2011 that showed no evidence of disease. He has been off treatment since December 2011 and had been doing well without any symptoms until the CT scan of the abdomen and pelvis on 07/01/2011 showed signs of recurrence. CT scans of abdomen and pelvis with IVC on 09/02/11 show further progression.  Rituxan, subcutaneous Velcade, intravenous Cytoxan and Decadron  were initiated on 10/15/2011. CT scans of the abdomen and pelvis carried out on 01/08/2012 showed a near complete response to therapy. CT scan of the abdomen and pelvis with IV contrast carried out on 06/19/2012 showed stable plaque-like soft tissue density in the upper abdominal retroperitoneum compared with the CT scan of 01/08/2012.    2. Coronary artery disease, S/P 2 MIs, most recently 03/21/10. Had heart cath on 03/22/10. LVEF is 40-50%.  3. Peripheral vascular disease.  4. Dyslipidemia.  5. Hypertension.  6. History of depression.  7. History of kidney stones.  8.  History of sleep apnea.  9. History of colonic polyps.  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 Kathlee Nations Trigt, status post radiation treatments.  11.Easy bruising.  12 Diverticulosis seen on CT scans done 06/19/2012. 13.Right Port-A-Cath placed on 11/20/2011.    MEDICATIONS:  1. Norvasc 2.5 mg daily.  2. Aspirin 81 mg every other day.  3. Atenolol 50 mg daily.  4. Celexa 20 mg daily.  5. Lasix 40 mg every other day.  6. Imdur 60 mg daily.  7. Plavix 75 mg daily.  8. K-Dur 20 mEq twice a day.  9. Pravachol 80 mg at bedtime.  10.Nitrostat 0.4 mg sublingually. The patient has not needed to use  this.  11.Acyclovir 400 mg b.i.d.  12.Bactrim DS 800/160 mg 1 tablet on Monday, Wednesdays, and Fridays.   Pneumovax was administered 09/06/2011.  Flu shot was administered in early December 2012 and on 05/13/2012.   SMOKING HISTORY: The patient has never smoked cigarettes.    HISTORY: I saw Martin Morris today for followup of his relapsed mantle cell lymphoma, with original diagnosis going back to March 2008.  Mr. Elia is accompanied by his wife, Martin Morris.  He was last seen by [place] on 05/13/2012.  He has continued to receive weekly chemotherapy utilizing Velcade, Cytoxan, and Decadron.  Every 3 weeks, he receives Rituxan 800 mg IV. Rituxan was most recently administered 3 weeks ago on October 9th.  The patient is due for Rituxan today.  He is without any complaints.  Specifically, he denies any neuropathy, any side effects  from his chemotherapy.  His condition is stable, without any changes.  We had made some adjustments with regard to his Lasix such that the patient is taking 40 mg every other day.  He feels that his edema has improved slightly.  He last took Lasix yesterday and will be due to take it tomorrow.  The patient is without any pain.  PHYSICAL EXAMINATION:  There is little change.  Weight is 101.3 pounds, height 5 feet 8  inches, body surface area 2.09 sq m.  Blood pressure 145/75.  Other vital signs are normal.  There is no scleral icterus. Mouth and pharynx are benign.  There is no peripheral adenopathy palpable.  Heart and lungs are normal.  There is a right-sided Port-A- Cath.  Abdomen is unchanged.  There may be an area of minimal induration just above the umbilicus associated with a scar.  Extremities continue to show 2 to 3+ edema of the lower legs.  Neurologic exam is normal.  LABORATORY DATA:  Today, white count 4.5, ANC 2.6, hemoglobin 12.1, hematocrit 35.9, platelets 141,000.  Differential is normal. Chemistries today are pending.  Chemistries from 05/13/2012 notable for an LDH of 315, as compared with 269 on 04/08/2012.  Albumin was 3.4.  On peripheral smear, there was a rare metamyelocyte and variant lymphocytes.  IMAGING STUDIES:  1. CT scan of chest, abdomen and pelvis with IV contrast on 01/01/2011  were negative for evidence of recurrent lymphoma.  2. PET scan from 08/22/2010 showed no evidence for recurrent lymphoma.  3. CT scan of chest, abdomen and pelvis with IV contrast on 01/01/2011  showed no evidence for lymphoma.  4. CT scan of abdomen and pelvis on 07/01/2011 showed interval  development of peritoneal disease worrisome for recurrence of  lymphoma. There was new right external iliac lymph node  pathologically enlarged and worrisome for recurrent lymphoma.  There was some stable appearance of mild soft tissue stranding  surrounding the celiac trunk and superior mesenteric artery.  5. Chest x-ray, 2 view, from 09/02/2011 showed no acute findings.   6. CT scan of abdomen and pelvis with IV contrast on 09/02/2011 showed  interval progression of lymphadenopathy in the abdomen and pelvis.  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 The 1.1 x 1.7 cm omental nodule  which was measured on the prior study of 07/01/2011 now measures  4.1 x 2.4 cm. A 2nd discrete soft tissue nodule in the omentum    which previously measured 1.1 x 1.5 cm now measures 2.2 x 3.0 cm.   7. CT scan of abdomen and pelvis with IV contrast on 01/08/2012 showed no residual measurable mass along the anterior aspect of the right diaphragm at the  site of the previously demonstrated 4.9 x 2.1 cm nodule. There is no  residual measurable gastrohepatic or celiac lymphadenopathy. There is  some soft tissue stranding around the proximal abdominal aorta, celiac  trunk and superior mesenteric artery as noted on images 27-34. Pelvic  lymphadenopathy is nearly completely resolved. A right external iliac  node measuring 6 mm on image 66 previously measured 9 mm. Another node  which previously measured 17 mm short axis now measures 8 mm on image  70. There is no residual inguinal adenopathy. The spleen remains  normal in size, demonstrates no focal abnormalities. There are no  suspect osseous findings. There is no residual mass at the left  posterior costophrenic angle. The final impression was near complete  response to therapy in the lymphadenopathy and omental disease with some  minimal residual measurable disease as previously noted. There was an  enlarged left inguinal hernia containing a knuckle of sigmoid colon with  no evidence for incarceration or bowel obstruction. There was a  nonobstructing left renal calculus.  8. CT scan of abdomen and pelvis with IV contrast was compared with the scan of 01/08/2012 and showed stable plaque-like soft tissue density in the upper abdominal retroperitoneum, which had the appearance of treated lymphoma.  No new or progressive disease within the abdomen or pelvis was noted.  There is a stable incidental finding of a small left inguinal hernia and diverticulosis.  IMPRESSION AND PLAN:  Mr. Sauls continues to do well with the current treatment program.  He is not having any side effects.  The CT scan fortunately shows no significant changes when compared with the prior CT scan  of 01/08/2012.  My inclination is to continue with the current program.  Today, Mr. Brinegar will receive Velcade 2.75 mg, Cytoxan 400 mg, and Decadron 20 mg IV, as well as Rituxan 800 mg IV.  The Rituxan will be will be continued every 3 weeks.  Chemotherapy with the other 3 agents will be continued weekly.  We are going to try to get Mr. Bolar onto a Monday schedule to avoid the Christmas and New Year's holiday which fall on Wednesday. Therefore, we are scheduling Mr. Haub's next treatments for Friday, November 8th; Monday, November 18th; and then every Monday thereafter, specifically November 25th, December 2nondistended, and December 9th. Mr. Kretschmer will be due for Rituxan on November 18th and December 9th. We will plan to see him again on December 16th at which time we will check CBC, chemistries, including an LDH.  In looking through the patient's chart, his last chest x-ray was on September 02, 2011.  We should therefore plan to repeat the chest x-ray probably in December.    ______________________________ Samul Dada, M.D. DSM/MEDQ  D:  06/24/2012  T:  06/25/2012  Job:  [id number]

## 2012-07-01 ENCOUNTER — Other Ambulatory Visit: Payer: Medicare Other | Admitting: Lab

## 2012-07-01 ENCOUNTER — Ambulatory Visit: Payer: Medicare Other

## 2012-07-02 ENCOUNTER — Telehealth: Payer: Self-pay | Admitting: Oncology

## 2012-07-02 NOTE — Telephone Encounter (Signed)
lmonvm for pt confirming appt for 11/8 and asking that pt get new schedule when he comes in.

## 2012-07-03 ENCOUNTER — Ambulatory Visit (HOSPITAL_BASED_OUTPATIENT_CLINIC_OR_DEPARTMENT_OTHER): Payer: Medicare Other

## 2012-07-03 ENCOUNTER — Other Ambulatory Visit (HOSPITAL_BASED_OUTPATIENT_CLINIC_OR_DEPARTMENT_OTHER): Payer: Medicare Other | Admitting: Lab

## 2012-07-03 VITALS — BP 151/80 | HR 62 | Temp 97.1°F

## 2012-07-03 DIAGNOSIS — C8313 Mantle cell lymphoma, intra-abdominal lymph nodes: Secondary | ICD-10-CM

## 2012-07-03 DIAGNOSIS — Z5112 Encounter for antineoplastic immunotherapy: Secondary | ICD-10-CM

## 2012-07-03 LAB — CBC WITH DIFFERENTIAL/PLATELET
Basophils Absolute: 0 10*3/uL (ref 0.0–0.1)
EOS%: 7.9 % — ABNORMAL HIGH (ref 0.0–7.0)
Eosinophils Absolute: 0.3 10*3/uL (ref 0.0–0.5)
HCT: 32.9 % — ABNORMAL LOW (ref 38.4–49.9)
HGB: 11 g/dL — ABNORMAL LOW (ref 13.0–17.1)
MCH: 34.4 pg — ABNORMAL HIGH (ref 27.2–33.4)
MCV: 102.8 fL — ABNORMAL HIGH (ref 79.3–98.0)
MONO%: 17.1 % — ABNORMAL HIGH (ref 0.0–14.0)
NEUT#: 2.1 10*3/uL (ref 1.5–6.5)
NEUT%: 51.5 % (ref 39.0–75.0)
Platelets: 204 10*3/uL (ref 140–400)

## 2012-07-03 MED ORDER — ONDANSETRON 8 MG/50ML IVPB (CHCC)
8.0000 mg | Freq: Once | INTRAVENOUS | Status: AC
  Administered 2012-07-03: 8 mg via INTRAVENOUS

## 2012-07-03 MED ORDER — SODIUM CHLORIDE 0.9 % IV SOLN
Freq: Once | INTRAVENOUS | Status: AC
  Administered 2012-07-03: 10:00:00 via INTRAVENOUS

## 2012-07-03 MED ORDER — SODIUM CHLORIDE 0.9 % IJ SOLN
10.0000 mL | INTRAMUSCULAR | Status: DC | PRN
  Administered 2012-07-03: 10 mL
  Filled 2012-07-03: qty 10

## 2012-07-03 MED ORDER — SODIUM CHLORIDE 0.9 % IV SOLN
190.0000 mg/m2 | Freq: Once | INTRAVENOUS | Status: AC
  Administered 2012-07-03: 400 mg via INTRAVENOUS
  Filled 2012-07-03: qty 20

## 2012-07-03 MED ORDER — DEXAMETHASONE SODIUM PHOSPHATE 4 MG/ML IJ SOLN
20.0000 mg | Freq: Once | INTRAMUSCULAR | Status: AC
  Administered 2012-07-03: 20 mg via INTRAVENOUS

## 2012-07-03 MED ORDER — HEPARIN SOD (PORK) LOCK FLUSH 100 UNIT/ML IV SOLN
500.0000 [IU] | Freq: Once | INTRAVENOUS | Status: AC | PRN
  Administered 2012-07-03: 500 [IU]
  Filled 2012-07-03: qty 5

## 2012-07-03 MED ORDER — BORTEZOMIB CHEMO SQ INJECTION 3.5 MG (2.5MG/ML)
1.3000 mg/m2 | Freq: Once | INTRAMUSCULAR | Status: AC
  Administered 2012-07-03: 2.75 mg via SUBCUTANEOUS
  Filled 2012-07-03: qty 2.75

## 2012-07-03 NOTE — Patient Instructions (Signed)
[facility] Discharge Instructions for Patients Receiving Chemotherapy  Today you received the following chemotherapy agents velcade and cytoxan  To help prevent nausea and vomiting after your treatment, we encourage you to take your nausea medication zofran. Begin taking it at  tonight and take it as often as prescribed..   If you develop nausea and vomiting that is not controlled by your nausea medication, call the clinic. If it is after clinic hours your family physician or the after hours number for the clinic or go to the Emergency Department.   BELOW ARE SYMPTOMS THAT SHOULD BE REPORTED IMMEDIATELY:  *FEVER GREATER THAN 100.5 F  *CHILLS WITH OR WITHOUT FEVER  NAUSEA AND VOMITING THAT IS NOT CONTROLLED WITH YOUR NAUSEA MEDICATION  *UNUSUAL SHORTNESS OF BREATH  *UNUSUAL BRUISING OR BLEEDING  TENDERNESS IN MOUTH AND THROAT WITH OR WITHOUT PRESENCE OF ULCERS  *URINARY PROBLEMS  *BOWEL PROBLEMS  UNUSUAL RASH Items with * indicate a potential emergency and should be followed up as soon as possible.  . Feel free to call the clinic you have any questions or concerns. The clinic phone number is [phone].   I have been informed and understand all the instructions given to me. I know to contact the clinic, my physician, or go to the Emergency Department if any problems should occur. I do not have any questions at this time, but understand that I may call the clinic during office hours   should I have any questions or need assistance in obtaining follow up care.    __________________________________________  _____________  __________ Signature of Patient or Authorized Representative            Date                   Time    __________________________________________ Nurse's Signature

## 2012-07-08 ENCOUNTER — Other Ambulatory Visit: Payer: Medicare Other | Admitting: Lab

## 2012-07-08 ENCOUNTER — Ambulatory Visit: Payer: Medicare Other

## 2012-07-13 ENCOUNTER — Ambulatory Visit (HOSPITAL_BASED_OUTPATIENT_CLINIC_OR_DEPARTMENT_OTHER): Payer: Medicare Other

## 2012-07-13 ENCOUNTER — Other Ambulatory Visit (HOSPITAL_BASED_OUTPATIENT_CLINIC_OR_DEPARTMENT_OTHER): Payer: Medicare Other | Admitting: Lab

## 2012-07-13 VITALS — BP 158/75 | HR 63 | Temp 97.4°F

## 2012-07-13 DIAGNOSIS — Z5112 Encounter for antineoplastic immunotherapy: Secondary | ICD-10-CM

## 2012-07-13 DIAGNOSIS — C8313 Mantle cell lymphoma, intra-abdominal lymph nodes: Secondary | ICD-10-CM

## 2012-07-13 DIAGNOSIS — Z5111 Encounter for antineoplastic chemotherapy: Secondary | ICD-10-CM

## 2012-07-13 LAB — CBC WITH DIFFERENTIAL/PLATELET
BASO%: 0.4 % (ref 0.0–2.0)
Basophils Absolute: 0 10*3/uL (ref 0.0–0.1)
HCT: 33.1 % — ABNORMAL LOW (ref 38.4–49.9)
HGB: 10.8 g/dL — ABNORMAL LOW (ref 13.0–17.1)
LYMPH%: 18.6 % (ref 14.0–49.0)
MCH: 33.8 pg — ABNORMAL HIGH (ref 27.2–33.4)
MCHC: 32.6 g/dL (ref 32.0–36.0)
MONO#: 0.8 10*3/uL (ref 0.1–0.9)
NEUT%: 60.9 % (ref 39.0–75.0)
Platelets: 163 10*3/uL (ref 140–400)
WBC: 5.3 10*3/uL (ref 4.0–10.3)

## 2012-07-13 MED ORDER — SODIUM CHLORIDE 0.9 % IV SOLN
190.0000 mg/m2 | Freq: Once | INTRAVENOUS | Status: AC
  Administered 2012-07-13: 400 mg via INTRAVENOUS
  Filled 2012-07-13: qty 20

## 2012-07-13 MED ORDER — DIPHENHYDRAMINE HCL 25 MG PO CAPS
25.0000 mg | ORAL_CAPSULE | Freq: Once | ORAL | Status: AC
  Administered 2012-07-13: 25 mg via ORAL

## 2012-07-13 MED ORDER — DEXAMETHASONE SODIUM PHOSPHATE 4 MG/ML IJ SOLN
20.0000 mg | Freq: Once | INTRAMUSCULAR | Status: AC
  Administered 2012-07-13: 20 mg via INTRAVENOUS

## 2012-07-13 MED ORDER — ONDANSETRON 8 MG/50ML IVPB (CHCC)
8.0000 mg | Freq: Once | INTRAVENOUS | Status: AC
  Administered 2012-07-13: 8 mg via INTRAVENOUS

## 2012-07-13 MED ORDER — SODIUM CHLORIDE 0.9 % IV SOLN
Freq: Once | INTRAVENOUS | Status: AC
  Administered 2012-07-13: 09:00:00 via INTRAVENOUS

## 2012-07-13 MED ORDER — HEPARIN SOD (PORK) LOCK FLUSH 100 UNIT/ML IV SOLN
500.0000 [IU] | Freq: Once | INTRAVENOUS | Status: AC | PRN
  Administered 2012-07-13: 500 [IU]
  Filled 2012-07-13: qty 5

## 2012-07-13 MED ORDER — BORTEZOMIB CHEMO SQ INJECTION 3.5 MG (2.5MG/ML)
1.3000 mg/m2 | Freq: Once | INTRAMUSCULAR | Status: AC
  Administered 2012-07-13: 2.75 mg via SUBCUTANEOUS
  Filled 2012-07-13: qty 2.75

## 2012-07-13 MED ORDER — ACETAMINOPHEN 325 MG PO TABS
650.0000 mg | ORAL_TABLET | Freq: Once | ORAL | Status: AC
  Administered 2012-07-13: 650 mg via ORAL

## 2012-07-13 MED ORDER — SODIUM CHLORIDE 0.9 % IJ SOLN
10.0000 mL | INTRAMUSCULAR | Status: DC | PRN
  Administered 2012-07-13: 10 mL
  Filled 2012-07-13: qty 10

## 2012-07-13 MED ORDER — SODIUM CHLORIDE 0.9 % IV SOLN
375.0000 mg/m2 | Freq: Once | INTRAVENOUS | Status: AC
  Administered 2012-07-13: 800 mg via INTRAVENOUS
  Filled 2012-07-13: qty 80

## 2012-07-13 MED ORDER — SODIUM CHLORIDE 0.9 % IV SOLN: Freq: Once | INTRAVENOUS | Status: DC

## 2012-07-13 NOTE — Patient Instructions (Signed)
[facility] Discharge Instructions for Patients Receiving Chemotherapy  Today you received the following chemotherapy agents: rituxan, cytoxan, velcade  To help prevent nausea and vomiting after your treatment, we encourage you to take your nausea medication. Take it as often as prescribed.    If you develop nausea and vomiting that is not controlled by your nausea medication, call the clinic. If it is after clinic hours your family physician or the after hours number for the clinic or go to the Emergency Department.   BELOW ARE SYMPTOMS THAT SHOULD BE REPORTED IMMEDIATELY:  *FEVER GREATER THAN 100.5 F  *CHILLS WITH OR WITHOUT FEVER  NAUSEA AND VOMITING THAT IS NOT CONTROLLED WITH YOUR NAUSEA MEDICATION  *UNUSUAL SHORTNESS OF BREATH  *UNUSUAL BRUISING OR BLEEDING  TENDERNESS IN MOUTH AND THROAT WITH OR WITHOUT PRESENCE OF ULCERS  *URINARY PROBLEMS  *BOWEL PROBLEMS  UNUSUAL RASH Items with * indicate a potential emergency and should be followed up as soon as possible.  Feel free to call the clinic you have any questions or concerns. The clinic phone number is [phone].   I have been informed and understand all the instructions given to me. I know to contact the clinic, my physician, or go to the Emergency Department if any problems should occur. I do not have any questions at this time, but understand that I may call the clinic during office hours   should I have any questions or need assistance in obtaining follow up care.    __________________________________________  _____________  __________ Signature of Patient or Authorized Representative            Date                   Time    __________________________________________ Nurse's Signature

## 2012-07-15 ENCOUNTER — Ambulatory Visit: Payer: Medicare Other

## 2012-07-15 ENCOUNTER — Other Ambulatory Visit: Payer: Medicare Other | Admitting: Lab

## 2012-07-20 ENCOUNTER — Ambulatory Visit (HOSPITAL_BASED_OUTPATIENT_CLINIC_OR_DEPARTMENT_OTHER): Payer: Medicare Other

## 2012-07-20 ENCOUNTER — Other Ambulatory Visit (HOSPITAL_BASED_OUTPATIENT_CLINIC_OR_DEPARTMENT_OTHER): Payer: Medicare Other | Admitting: Lab

## 2012-07-20 VITALS — BP 168/80 | HR 64 | Temp 97.2°F

## 2012-07-20 DIAGNOSIS — C8313 Mantle cell lymphoma, intra-abdominal lymph nodes: Secondary | ICD-10-CM

## 2012-07-20 DIAGNOSIS — Z5112 Encounter for antineoplastic immunotherapy: Secondary | ICD-10-CM

## 2012-07-20 LAB — CBC WITH DIFFERENTIAL/PLATELET
BASO%: 0.2 % (ref 0.0–2.0)
Eosinophils Absolute: 0.3 10*3/uL (ref 0.0–0.5)
MCHC: 33.4 g/dL (ref 32.0–36.0)
MONO#: 0.9 10*3/uL (ref 0.1–0.9)
NEUT#: 3 10*3/uL (ref 1.5–6.5)
Platelets: 147 10*3/uL (ref 140–400)
RBC: 3.53 10*6/uL — ABNORMAL LOW (ref 4.20–5.82)
WBC: 5.2 10*3/uL (ref 4.0–10.3)
lymph#: 1.1 10*3/uL (ref 0.9–3.3)
nRBC: 0 % (ref 0–0)

## 2012-07-20 MED ORDER — BORTEZOMIB CHEMO SQ INJECTION 3.5 MG (2.5MG/ML)
1.3000 mg/m2 | Freq: Once | INTRAMUSCULAR | Status: AC
  Administered 2012-07-20: 2.75 mg via SUBCUTANEOUS
  Filled 2012-07-20: qty 2.75

## 2012-07-20 MED ORDER — HEPARIN SOD (PORK) LOCK FLUSH 100 UNIT/ML IV SOLN
500.0000 [IU] | Freq: Once | INTRAVENOUS | Status: AC | PRN
  Administered 2012-07-20: 500 [IU]
  Filled 2012-07-20: qty 5

## 2012-07-20 MED ORDER — SODIUM CHLORIDE 0.9 % IV SOLN
190.0000 mg/m2 | Freq: Once | INTRAVENOUS | Status: AC
  Administered 2012-07-20: 400 mg via INTRAVENOUS
  Filled 2012-07-20: qty 20

## 2012-07-20 MED ORDER — SODIUM CHLORIDE 0.9 % IJ SOLN
10.0000 mL | INTRAMUSCULAR | Status: DC | PRN
  Administered 2012-07-20: 10 mL
  Filled 2012-07-20: qty 10

## 2012-07-20 MED ORDER — SODIUM CHLORIDE 0.9 % IV SOLN
Freq: Once | INTRAVENOUS | Status: AC
  Administered 2012-07-20: 09:00:00 via INTRAVENOUS

## 2012-07-20 MED ORDER — ONDANSETRON 8 MG/50ML IVPB (CHCC)
8.0000 mg | Freq: Once | INTRAVENOUS | Status: AC
  Administered 2012-07-20: 8 mg via INTRAVENOUS

## 2012-07-20 MED ORDER — DEXAMETHASONE SODIUM PHOSPHATE 4 MG/ML IJ SOLN
20.0000 mg | Freq: Once | INTRAMUSCULAR | Status: AC
  Administered 2012-07-20: 20 mg via INTRAVENOUS

## 2012-07-20 NOTE — Patient Instructions (Addendum)
[facility] Discharge Instructions for Patients Receiving Chemotherapy  Today you received the following chemotherapy agents Velcade and Cytoxan.  To help prevent nausea and vomiting after your treatment, we encourage you to take your nausea medication as ordered per MD.    If you develop nausea and vomiting that is not controlled by your nausea medication, call the clinic. If it is after clinic hours your family physician or the after hours number for the clinic or go to the Emergency Department.   BELOW ARE SYMPTOMS THAT SHOULD BE REPORTED IMMEDIATELY:  *FEVER GREATER THAN 100.5 F  *CHILLS WITH OR WITHOUT FEVER  NAUSEA AND VOMITING THAT IS NOT CONTROLLED WITH YOUR NAUSEA MEDICATION  *UNUSUAL SHORTNESS OF BREATH  *UNUSUAL BRUISING OR BLEEDING  TENDERNESS IN MOUTH AND THROAT WITH OR WITHOUT PRESENCE OF ULCERS  *URINARY PROBLEMS  *BOWEL PROBLEMS  UNUSUAL RASH Items with * indicate a potential emergency and should be followed up as soon as possible.   Please let the nurse know about any problems that you may have experienced. Feel free to call the clinic you have any questions or concerns. The clinic phone number is [phone].   I have been informed and understand all the instructions given to me. I know to contact the clinic, my physician, or go to the Emergency Department if any problems should occur. I do not have any questions at this time, but understand that I may call the clinic during office hours   should I have any questions or need assistance in obtaining follow up care.

## 2012-07-22 ENCOUNTER — Other Ambulatory Visit: Payer: Medicare Other | Admitting: Lab

## 2012-07-27 ENCOUNTER — Other Ambulatory Visit (HOSPITAL_BASED_OUTPATIENT_CLINIC_OR_DEPARTMENT_OTHER): Payer: Medicare Other

## 2012-07-27 ENCOUNTER — Ambulatory Visit (HOSPITAL_BASED_OUTPATIENT_CLINIC_OR_DEPARTMENT_OTHER): Payer: Medicare Other

## 2012-07-27 VITALS — BP 127/73 | HR 56 | Temp 97.3°F

## 2012-07-27 DIAGNOSIS — C8313 Mantle cell lymphoma, intra-abdominal lymph nodes: Secondary | ICD-10-CM

## 2012-07-27 DIAGNOSIS — Z5112 Encounter for antineoplastic immunotherapy: Secondary | ICD-10-CM

## 2012-07-27 LAB — CBC WITH DIFFERENTIAL/PLATELET
Basophils Absolute: 0 10*3/uL (ref 0.0–0.1)
Eosinophils Absolute: 0.2 10*3/uL (ref 0.0–0.5)
HGB: 11.7 g/dL — ABNORMAL LOW (ref 13.0–17.1)
MCV: 102.3 fL — ABNORMAL HIGH (ref 79.3–98.0)
MONO%: 21.2 % — ABNORMAL HIGH (ref 0.0–14.0)
NEUT#: 1.8 10*3/uL (ref 1.5–6.5)
RBC: 3.42 10*6/uL — ABNORMAL LOW (ref 4.20–5.82)
RDW: 13.5 % (ref 11.0–14.6)
WBC: 3.4 10*3/uL — ABNORMAL LOW (ref 4.0–10.3)
lymph#: 0.6 10*3/uL — ABNORMAL LOW (ref 0.9–3.3)
nRBC: 1 % — ABNORMAL HIGH (ref 0–0)

## 2012-07-27 LAB — TECHNOLOGIST REVIEW

## 2012-07-27 MED ORDER — SODIUM CHLORIDE 0.9 % IV SOLN
Freq: Once | INTRAVENOUS | Status: AC
  Administered 2012-07-27: 10:00:00 via INTRAVENOUS

## 2012-07-27 MED ORDER — BORTEZOMIB CHEMO SQ INJECTION 3.5 MG (2.5MG/ML)
1.3000 mg/m2 | Freq: Once | INTRAMUSCULAR | Status: AC
  Administered 2012-07-27: 2.75 mg via SUBCUTANEOUS
  Filled 2012-07-27: qty 2.75

## 2012-07-27 MED ORDER — ONDANSETRON 8 MG/50ML IVPB (CHCC)
8.0000 mg | Freq: Once | INTRAVENOUS | Status: AC
  Administered 2012-07-27: 8 mg via INTRAVENOUS

## 2012-07-27 MED ORDER — SODIUM CHLORIDE 0.9 % IJ SOLN
10.0000 mL | INTRAMUSCULAR | Status: DC | PRN
  Administered 2012-07-27: 10 mL
  Filled 2012-07-27: qty 10

## 2012-07-27 MED ORDER — HEPARIN SOD (PORK) LOCK FLUSH 100 UNIT/ML IV SOLN
500.0000 [IU] | Freq: Once | INTRAVENOUS | Status: AC | PRN
  Administered 2012-07-27: 500 [IU]
  Filled 2012-07-27: qty 5

## 2012-07-27 MED ORDER — SODIUM CHLORIDE 0.9 % IV SOLN
190.0000 mg/m2 | Freq: Once | INTRAVENOUS | Status: AC
  Administered 2012-07-27: 400 mg via INTRAVENOUS
  Filled 2012-07-27: qty 20

## 2012-07-27 MED ORDER — DEXAMETHASONE SODIUM PHOSPHATE 4 MG/ML IJ SOLN
20.0000 mg | Freq: Once | INTRAMUSCULAR | Status: AC
  Administered 2012-07-27: 20 mg via INTRAVENOUS

## 2012-07-27 NOTE — Patient Instructions (Addendum)
[facility] Discharge Instructions for Patients Receiving Chemotherapy  Today you received the following chemotherapy agents Velcade and Cytoxan  To help prevent nausea and vomiting after your treatment, we encourage you to take your nausea medication as prescribed.   If you develop nausea and vomiting that is not controlled by your nausea medication, call the clinic. If it is after clinic hours your family physician or the after hours number for the clinic or go to the Emergency Department.   BELOW ARE SYMPTOMS THAT SHOULD BE REPORTED IMMEDIATELY:  *FEVER GREATER THAN 100.5 F  *CHILLS WITH OR WITHOUT FEVER  NAUSEA AND VOMITING THAT IS NOT CONTROLLED WITH YOUR NAUSEA MEDICATION  *UNUSUAL SHORTNESS OF BREATH  *UNUSUAL BRUISING OR BLEEDING  TENDERNESS IN MOUTH AND THROAT WITH OR WITHOUT PRESENCE OF ULCERS  *URINARY PROBLEMS  *BOWEL PROBLEMS  UNUSUAL RASH Items with * indicate a potential emergency and should be followed up as soon as possible.   Feel free to call the clinic you have any questions or concerns. The clinic phone number is [phone].   I have been informed and understand all the instructions given to me. I know to contact the clinic, my physician, or go to the Emergency Department if any problems should occur. I do not have any questions at this time, but understand that I may call the clinic during office hours   should I have any questions or need assistance in obtaining follow up care.    __________________________________________  _____________  __________ Signature of Patient or Authorized Representative            Date                   Time    __________________________________________ Nurse's Signature

## 2012-07-31 ENCOUNTER — Other Ambulatory Visit: Payer: Self-pay | Admitting: Medical Oncology

## 2012-08-03 ENCOUNTER — Other Ambulatory Visit (HOSPITAL_BASED_OUTPATIENT_CLINIC_OR_DEPARTMENT_OTHER): Payer: Medicare Other | Admitting: Lab

## 2012-08-03 ENCOUNTER — Ambulatory Visit (HOSPITAL_BASED_OUTPATIENT_CLINIC_OR_DEPARTMENT_OTHER): Payer: Medicare Other

## 2012-08-03 VITALS — BP 128/69 | HR 59 | Temp 96.8°F | Resp 18

## 2012-08-03 DIAGNOSIS — Z5112 Encounter for antineoplastic immunotherapy: Secondary | ICD-10-CM

## 2012-08-03 DIAGNOSIS — Z5111 Encounter for antineoplastic chemotherapy: Secondary | ICD-10-CM

## 2012-08-03 DIAGNOSIS — C8313 Mantle cell lymphoma, intra-abdominal lymph nodes: Secondary | ICD-10-CM

## 2012-08-03 LAB — CBC WITH DIFFERENTIAL/PLATELET
BASO%: 0.2 % (ref 0.0–2.0)
EOS%: 9 % — ABNORMAL HIGH (ref 0.0–7.0)
HCT: 33.9 % — ABNORMAL LOW (ref 38.4–49.9)
HGB: 11 g/dL — ABNORMAL LOW (ref 13.0–17.1)
MCH: 33.8 pg — ABNORMAL HIGH (ref 27.2–33.4)
MCHC: 32.4 g/dL (ref 32.0–36.0)
MONO#: 0.9 10*3/uL (ref 0.1–0.9)
NEUT%: 45.5 % (ref 39.0–75.0)
RDW: 13.6 % (ref 11.0–14.6)
WBC: 4 10*3/uL (ref 4.0–10.3)
lymph#: 0.9 10*3/uL (ref 0.9–3.3)

## 2012-08-03 MED ORDER — BORTEZOMIB CHEMO SQ INJECTION 3.5 MG (2.5MG/ML)
1.3000 mg/m2 | Freq: Once | INTRAMUSCULAR | Status: AC
  Administered 2012-08-03: 2.75 mg via SUBCUTANEOUS
  Filled 2012-08-03: qty 2.75

## 2012-08-03 MED ORDER — SODIUM CHLORIDE 0.9 % IV SOLN
Freq: Once | INTRAVENOUS | Status: AC
  Administered 2012-08-03: 11:00:00 via INTRAVENOUS

## 2012-08-03 MED ORDER — DEXAMETHASONE SODIUM PHOSPHATE 4 MG/ML IJ SOLN
20.0000 mg | Freq: Once | INTRAMUSCULAR | Status: AC
Start: 2012-08-03 — End: 2012-08-03
  Administered 2012-08-03: 20 mg via INTRAVENOUS

## 2012-08-03 MED ORDER — SODIUM CHLORIDE 0.9 % IV SOLN
375.0000 mg/m2 | Freq: Once | INTRAVENOUS | Status: AC
  Administered 2012-08-03: 800 mg via INTRAVENOUS
  Filled 2012-08-03: qty 80

## 2012-08-03 MED ORDER — ONDANSETRON 8 MG/50ML IVPB (CHCC)
8.0000 mg | Freq: Once | INTRAVENOUS | Status: AC
  Administered 2012-08-03: 8 mg via INTRAVENOUS

## 2012-08-03 MED ORDER — ACETAMINOPHEN 325 MG PO TABS
650.0000 mg | ORAL_TABLET | Freq: Once | ORAL | Status: AC
  Administered 2012-08-03: 650 mg via ORAL

## 2012-08-03 MED ORDER — DIPHENHYDRAMINE HCL 25 MG PO CAPS
25.0000 mg | ORAL_CAPSULE | Freq: Once | ORAL | Status: AC
  Administered 2012-08-03: 25 mg via ORAL

## 2012-08-03 MED ORDER — SODIUM CHLORIDE 0.9 % IJ SOLN
10.0000 mL | INTRAMUSCULAR | Status: DC | PRN
  Administered 2012-08-03: 10 mL
  Filled 2012-08-03: qty 10

## 2012-08-03 MED ORDER — HEPARIN SOD (PORK) LOCK FLUSH 100 UNIT/ML IV SOLN
500.0000 [IU] | Freq: Once | INTRAVENOUS | Status: AC | PRN
  Administered 2012-08-03: 500 [IU]
  Filled 2012-08-03: qty 5

## 2012-08-03 MED ORDER — SODIUM CHLORIDE 0.9 % IV SOLN
190.0000 mg/m2 | Freq: Once | INTRAVENOUS | Status: AC
  Administered 2012-08-03: 400 mg via INTRAVENOUS
  Filled 2012-08-03: qty 20

## 2012-08-04 ENCOUNTER — Other Ambulatory Visit: Payer: Self-pay | Admitting: Certified Registered Nurse Anesthetist

## 2012-08-04 ENCOUNTER — Other Ambulatory Visit: Payer: Self-pay | Admitting: Cardiovascular Disease

## 2012-08-10 ENCOUNTER — Telehealth: Payer: Self-pay | Admitting: *Deleted

## 2012-08-10 ENCOUNTER — Ambulatory Visit (HOSPITAL_BASED_OUTPATIENT_CLINIC_OR_DEPARTMENT_OTHER): Payer: Medicare Other | Admitting: Family

## 2012-08-10 ENCOUNTER — Ambulatory Visit (HOSPITAL_BASED_OUTPATIENT_CLINIC_OR_DEPARTMENT_OTHER): Payer: Medicare Other

## 2012-08-10 ENCOUNTER — Telehealth: Payer: Self-pay | Admitting: Oncology

## 2012-08-10 ENCOUNTER — Encounter: Payer: Self-pay | Admitting: Family

## 2012-08-10 ENCOUNTER — Other Ambulatory Visit (HOSPITAL_BASED_OUTPATIENT_CLINIC_OR_DEPARTMENT_OTHER): Payer: Medicare Other | Admitting: Lab

## 2012-08-10 ENCOUNTER — Ambulatory Visit: Payer: Medicare Other | Admitting: Oncology

## 2012-08-10 VITALS — BP 172/76 | HR 61 | Temp 97.3°F | Resp 20 | Ht 68.0 in | Wt 206.8 lb

## 2012-08-10 DIAGNOSIS — Z5112 Encounter for antineoplastic immunotherapy: Secondary | ICD-10-CM

## 2012-08-10 DIAGNOSIS — I739 Peripheral vascular disease, unspecified: Secondary | ICD-10-CM

## 2012-08-10 DIAGNOSIS — G609 Hereditary and idiopathic neuropathy, unspecified: Secondary | ICD-10-CM

## 2012-08-10 DIAGNOSIS — C8313 Mantle cell lymphoma, intra-abdominal lymph nodes: Secondary | ICD-10-CM

## 2012-08-10 DIAGNOSIS — IMO0002 Reserved for concepts with insufficient information to code with codable children: Secondary | ICD-10-CM

## 2012-08-10 DIAGNOSIS — S80819A Abrasion, unspecified lower leg, initial encounter: Secondary | ICD-10-CM

## 2012-08-10 LAB — CBC WITH DIFFERENTIAL/PLATELET
Basophils Absolute: 0 10*3/uL (ref 0.0–0.1)
Eosinophils Absolute: 0.3 10*3/uL (ref 0.0–0.5)
HGB: 11.6 g/dL — ABNORMAL LOW (ref 13.0–17.1)
LYMPH%: 22.4 % (ref 14.0–49.0)
MCV: 102.6 fL — ABNORMAL HIGH (ref 79.3–98.0)
MONO#: 1 10*3/uL — ABNORMAL HIGH (ref 0.1–0.9)
MONO%: 23.3 % — ABNORMAL HIGH (ref 0.0–14.0)
NEUT#: 2 10*3/uL (ref 1.5–6.5)
Platelets: 146 10*3/uL (ref 140–400)
RBC: 3.42 10*6/uL — ABNORMAL LOW (ref 4.20–5.82)
RDW: 13.7 % (ref 11.0–14.6)
WBC: 4.3 10*3/uL (ref 4.0–10.3)
nRBC: 0 % (ref 0–0)

## 2012-08-10 LAB — TECHNOLOGIST REVIEW

## 2012-08-10 MED ORDER — SODIUM CHLORIDE 0.9 % IV SOLN
Freq: Once | INTRAVENOUS | Status: AC
  Administered 2012-08-10: 12:00:00 via INTRAVENOUS

## 2012-08-10 MED ORDER — CEPHALEXIN 500 MG PO CAPS: 500.0000 mg | ORAL_CAPSULE | Freq: Two times a day (BID) | ORAL | Status: DC

## 2012-08-10 MED ORDER — SODIUM CHLORIDE 0.9 % IJ SOLN
10.0000 mL | INTRAMUSCULAR | Status: DC | PRN
  Administered 2012-08-10: 10 mL
  Filled 2012-08-10: qty 10

## 2012-08-10 MED ORDER — ACETAMINOPHEN 325 MG PO TABS: 650.0000 mg | ORAL_TABLET | Freq: Once | ORAL | Status: DC

## 2012-08-10 MED ORDER — SODIUM CHLORIDE 0.9 % IV SOLN: 375.0000 mg/m2 | Freq: Once | INTRAVENOUS | Status: DC

## 2012-08-10 MED ORDER — DEXAMETHASONE SODIUM PHOSPHATE 4 MG/ML IJ SOLN
20.0000 mg | Freq: Once | INTRAMUSCULAR | Status: AC
  Administered 2012-08-10: 20 mg via INTRAVENOUS

## 2012-08-10 MED ORDER — HEPARIN SOD (PORK) LOCK FLUSH 100 UNIT/ML IV SOLN
500.0000 [IU] | Freq: Once | INTRAVENOUS | Status: AC | PRN
  Administered 2012-08-10: 500 [IU]
  Filled 2012-08-10: qty 5

## 2012-08-10 MED ORDER — ONDANSETRON 8 MG/50ML IVPB (CHCC)
8.0000 mg | Freq: Once | INTRAVENOUS | Status: AC
  Administered 2012-08-10: 8 mg via INTRAVENOUS

## 2012-08-10 MED ORDER — SODIUM CHLORIDE 0.9 % IV SOLN
190.0000 mg/m2 | Freq: Once | INTRAVENOUS | Status: AC
  Administered 2012-08-10: 400 mg via INTRAVENOUS
  Filled 2012-08-10: qty 20

## 2012-08-10 MED ORDER — DIPHENHYDRAMINE HCL 25 MG PO CAPS: 25.0000 mg | ORAL_CAPSULE | Freq: Once | ORAL | Status: DC

## 2012-08-10 MED ORDER — BORTEZOMIB CHEMO SQ INJECTION 3.5 MG (2.5MG/ML)
1.3000 mg/m2 | Freq: Once | INTRAMUSCULAR | Status: AC
  Administered 2012-08-10: 2.75 mg via SUBCUTANEOUS
  Filled 2012-08-10: qty 2.75

## 2012-08-10 MED ORDER — SODIUM CHLORIDE 0.9 % IV SOLN: Freq: Once | INTRAVENOUS | Status: DC

## 2012-08-10 NOTE — Patient Instructions (Addendum)
[facility] Discharge Instructions for Patients Receiving Chemotherapy  Today you received the following chemotherapy agents velcade/cytoxan  To help prevent nausea and vomiting after your treatment, we encourage you to take your nausea medication  and take it as often as prescribed   If you develop nausea and vomiting that is not controlled by your nausea medication, call the clinic. If it is after clinic hours your family physician or the after hours number for the clinic or go to the Emergency Department.   BELOW ARE SYMPTOMS THAT SHOULD BE REPORTED IMMEDIATELY:  *FEVER GREATER THAN 100.5 F  *CHILLS WITH OR WITHOUT FEVER  NAUSEA AND VOMITING THAT IS NOT CONTROLLED WITH YOUR NAUSEA MEDICATION  *UNUSUAL SHORTNESS OF BREATH  *UNUSUAL BRUISING OR BLEEDING  TENDERNESS IN MOUTH AND THROAT WITH OR WITHOUT PRESENCE OF ULCERS  *URINARY PROBLEMS  *BOWEL PROBLEMS  UNUSUAL RASH Items with * indicate a potential emergency and should be followed up as soon as possible.  One of the nurses will contact you 24 hours after your treatment. Please let the nurse know about any problems that you may have experienced. Feel free to call the clinic you have any questions or concerns. The clinic phone number is [phone].   I have been informed and understand all the instructions given to me. I know to contact the clinic, my physician, or go to the Emergency Department if any problems should occur. I do not have any questions at this time, but understand that I may call the clinic during office hours   should I have any questions or need assistance in obtaining follow up care.    __________________________________________  _____________  __________ Signature of Patient or Authorized Representative            Date                   Time    __________________________________________ Nurse's Signature

## 2012-08-10 NOTE — Progress Notes (Signed)
Patient ID: Martin Morris, male   DOB: 04-28-1933, 78 y.o.   MRN: [id number] CSN: [id number]  CC: Martin Morris, M.D.  Problem List: Martin Morris is a 78 y.o. Caucasian male with a problem list consisting of:  1. Mantle cell lymphoma diagnosed in March 2008 with positive bone marrow initially on 12/03/2006 and then negative bone marrow after treatment in October 2009. As stated, the patient presented with obstructive liver abnormalities requiring ERCP and non metal stent placement by Dr. Melvia Heaps. The stent was subsequently removed in October 2008. The patient was treated with 8 cycles of Rituxan, Cytoxan, vincristine and Decadron in combination with Neulasta from 12/19/2006 through 05/15/2007. The patient then received maintenance Rituxan from July 16, 2007 through February 01, 2010. While on Rituxan, he had a recurrence of his disease by CT scan carried out on 01/24/2010. [name] then received treatment with bendamustine and Rituxan in combination with Neulasta for 6 cycles from 02/19/2010 through 07/26/2010. We have a prior PET scan from 08/22/2010 and CT scans of chest, abdomen, and pelvis from 01/01/2011 that showed no evidence of disease. He has been off treatment since December 2011 and had been doing well without any symptoms until the CT scan of the abdomen and pelvis on 07/01/2011 showed signs of recurrence. CT scans of abdomen and pelvis with IVC on 09/02/11 show further progression. Rituxan, subcutaneous Velcade, intravenous Cytoxan and Decadron were initiated on 10/15/2011. CT scans of the abdomen and pelvis carried out on 01/08/2012 showed a near complete response to therapy. CT scan of the abdomen and pelvis with IV contrast carried out on 06/19/2012 showed stable plaque-like soft tissue density in the upper abdominal retroperitoneum compared with the CT scan of 01/08/2012.  2. Coronary artery disease, S/P 2 MIs, most recently 03/21/10. Had heart cath on 03/22/10. LVEF is 40-50%.   3. Peripheral vascular disease 4. Dyslipidemia 5. Hypertension 6. History of depression  7. History of kidney stones  8. History of sleep apnea 9. History of colonic polyps 10.History of cancer of the thymus gland status thymectomy on 04/13/2002 by Dr. Kathlee Nations Trigt, status post radiation treatments.  11.Easy bruising.  12 Diverticulosis seen on CT scans done 06/19/2012.  13.Right Port-A-Cath placed on 11/20/2011.   Pneumovax was administered 09/06/2011.  Influenza vaccination was administered on 05/13/2012.  Dr. Arline Asp and I saw Mr. Martin Morris today for follow up of his relapsed mantle cell lymphoma, with original diagnosis going back to March 2008.  [name] is accompanied by his wife, Martin Morris for today's office visit. He was last seen by [place] on 06/24/2012. He continues to receive weekly chemotherapy utilizing Velcade, Cytoxan, and Decadron.  He receives Rituxan 800 mg IV every three weeks.  Rituxan was most recently administered on 08/03/2012.   [name] is due for Rituxan again on 08/24/2012   Mr. Martin Morris has several medical issues today.  He complains of ongoing fatigue.  He then proceeds to take off his right shoe to display a right lower extremity that is edematous, erythematous, has an open wound on the shin area and has areas of pronounced bluish-purple discoloration on his metatarsals.  He states that he has had ambulation difficulty for the past two months because of neuropathy and pain in his toes (bilaterally). Mr. Martin Morris states that scraped his right leg on a wrought iron table about a week ago while putting up Christmas decorations and he has had a weepy, erythemic, partially scabbed shin area abrasion ever since.  The  patient does not believe he has ever received the tetanus vaccination.     With regard to the mantle cell lymphoma, the patient denies any side effects from his chemotherapy.  Specifically he denies N/V/D, hand/foot disease, fever, chills, rash, unusual  bleeding, difficulty/problems with urination or defecation, constipation or night sweats.  His wife reveals that he has been inconsistent with taking the Lasix.  The patient and his wife deny any other symptomatology.  Past Medical History: Past Medical History  Diagnosis Date  . thymus ca dx'd 2003    Radiation comp 2003  . NHL (non-Hodgkin's lymphoma) dx'd 2008    Chemo comp 10/2010; rituxin comp 10/2010  . Hypertension   . CAD 11/12/2007     (Cath 7/11 Patent LIMA to the LAD. Previously known occlusion of a saphenous vein graft to diagonal and    sequential to obtuse marginals. Severe diffuse native obtuse marginal and diagonal branch vessel disease.    Preserved ejection fraction.)  . HYPERLIPIDEMIA 11/12/2007  . HYPERTENSION 11/12/2007  . PERIPHERAL VASCULAR DISEASE 11/12/2007  . ARTHRITIS 11/12/2007  . BENIGN PROSTATIC HYPERTROPHY, HX OF 11/12/2007  . 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 Edema 10/17/2010  . INGUINAL HERNIAS, BILATERAL 11/10/2006  . MANTLE CELL LYMPHOMA INTRA-ABDOMINAL LYMPH NODES 11/12/2007  . Obesity, unspecified 07/05/2009  . SLEEP APNEA 11/12/2007  . History of PTCA 2005  . Osteoarthritis   . Carotid stenosis, bilateral   . Nephrolithiasis     Surgical History: Past Surgical History  Procedure Date  . Coronary artery bypass graft   . Cardiac catheterization 08/2009, 02/2010    Current Medications: Current Outpatient Prescriptions  Medication Sig Dispense Refill  . acyclovir (ZOVIRAX) 400 MG tablet Take 1 tablet (400 mg total) by mouth 2 (two) times daily.  60 tablet  6  . amLODipine (NORVASC) 2.5 MG tablet Take 2.5 mg by mouth daily.        Marland Kitchen aspirin 81 MG tablet Take 81 mg by mouth every other day.      Marland Kitchen atenolol (TENORMIN) 50 MG tablet TAKE ONE TABLET BY MOUTH EVERY DAY  30 tablet  6  . citalopram (CELEXA) 20 MG tablet TAKE ONE TABLET BY MOUTH EVERY DAY  30 tablet  3  . clopidogrel (PLAVIX) 75 MG tablet TAKE ONE TABLET BY MOUTH EVERY DAY  30 tablet  5  . furosemide  (LASIX) 20 MG tablet Take 20 mg by mouth daily as needed. For fluid.      . isosorbide mononitrate (IMDUR) 60 MG 24 hr tablet TAKE ONE TABLET BY MOUTH EVERY DAY  30 tablet  5  . lidocaine-prilocaine (EMLA) cream Apply to port 1 hour before treatment. Cover site with plastic wrap.  30 g  11  . naproxen sodium (ANAPROX) 220 MG tablet Take 440 mg by mouth 2 (two) times daily as needed. For pain.      . nitroGLYCERIN (NITROSTAT) 0.4 MG SL tablet Place 1 tablet (0.4 mg total) under the tongue every 5 (five) minutes as needed. For chest pain.  25 tablet  prn  . pravastatin (PRAVACHOL) 40 MG tablet TAKE TWO TABLETS BY MOUTH AT BEDTIME  60 tablet  5  . PRESCRIPTION MEDICATION Inject into the vein once a week. Velcade and Cytoxan.      . sulfamethoxazole-trimethoprim (BACTRIM DS) 800-160 MG per tablet TAKE ONE TABLET BY MOUTH EVERY DAY AS  DIRECTED.  TAKE  1  TABLET  ON  MONDAY,  WEDNESDAY  AND  FRIDAY  24 tablet  1  . cephALEXin (KEFLEX) 500 MG capsule Take 1 capsule (500 mg total) by mouth 2 (two) times daily.  14 capsule  0   No current facility-administered medications for this visit.   Facility-Administered Medications Ordered in Other Visits  Medication Dose Route Frequency Provider Last Rate Last Dose  . 0.9 %  sodium chloride infusion   Intravenous Once Samul Dada, MD      . sodium chloride 0.9 % injection 10 mL  10 mL Intracatheter PRN Samul Dada, MD   10 mL at 08/03/12 1419  . sodium chloride 0.9 % injection 10 mL  10 mL Intracatheter PRN Samul Dada, MD   10 mL at 08/10/12 1320    Allergies: Allergies  Allergen Reactions  . Atorvastatin     Family History: Family History  Problem Relation Age of Onset  . Coronary artery disease Mother     died at 6  . Heart attack Sister     died at age 91  . Heart attack Brother     Social History: History  Substance Use Topics  . Smoking status: Never Smoker   . Smokeless tobacco: Never Used  . Alcohol Use: No     Review of Systems: 10 Point review of systems was completed and is negative except as noted above.   Physical Exam:   Blood pressure 172/76, pulse 61, temperature 97.3 F (36.3 C), temperature source Oral, resp. rate 20, height 5\' 8"  (1.727 m), weight 206 lb 12.8 oz (93.804 kg).  General appearance: Alert, cooperative, well nourished, no apparent distress Head: Normocephalic, without obvious abnormality, atraumatic Eyes: Conjunctivae/corneas clear, PERRLA, EOMI Nose: Nares, septum and mucosa are normal, no drainage or sinus tenderness Neck: No adenopathy, supple, symmetrical, trachea midline, thyroid not enlarged, no tenderness Resp: Clear to auscultation bilaterally, diminished bibasilar breath sounds Cardio: Regular rate and rhythm, S1, S2 normal, no murmur, click, rub or gallop, right chest Port-A-Cath without signs of infection GI: Soft, non-tender, distended, well healed surgical scar, small area of induration and firmness by umbilicus, hypoactive bowel sounds, no organomegaly Skin: There is 1 small area about 1-2 cm on his right leg where there is some crusting,  RLE erythema with partially weepy/partially crusted wound on shin area. Extremities: Extremities atraumatic, RLE area of hyperpigmentation/bluish-purple on metatarsals, 3+ pitting edema RLE, +2 edema LLE Lymph nodes: Cervical, supraclavicular, and axillary nodes normal Neurologic: Grossly normal   Laboratory Data: Results for orders placed in visit on 08/10/12 (from the past 48 hour(s))  CBC WITH DIFFERENTIAL     Status: Abnormal   Collection Time   08/10/12  9:52 AM      Component Value Range Comment   WBC 4.3  4.0 - 10.3 10e3/uL    NEUT# 2.0  1.5 - 6.5 10e3/uL    HGB 11.6 (*) 13.0 - 17.1 g/dL    HCT 95.2 (*) 84.1 - 49.9 %    Platelets 146  140 - 400 10e3/uL    MCV 102.6 (*) 79.3 - 98.0 fL    MCH 33.9 (*) 27.2 - 33.4 pg    MCHC 33.0  32.0 - 36.0 g/dL    RBC 3.24 (*) 4.01 - 5.82 10e6/uL    RDW 13.7  11.0 -  14.6 %    lymph# 1.0  0.9 - 3.3 10e3/uL    MONO# 1.0 (*) 0.1 - 0.9 10e3/uL    Eosinophils Absolute 0.3  0.0 - 0.5 10e3/uL    Basophils Absolute 0.0  0.0 -  0.1 10e3/uL    NEUT% 47.7  39.0 - 75.0 %    LYMPH% 22.4  14.0 - 49.0 %    MONO% 23.3 (*) 0.0 - 14.0 %    EOS% 5.9  0.0 - 7.0 %    BASO% 0.7  0.0 - 2.0 %    nRBC 0  0 - 0 %   TECHNOLOGIST REVIEW     Status: Normal   Collection Time   08/10/12  9:52 AM      Component Value Range Comment   Technologist Review rare meta        Imaging Studies: 1. CT scan of chest, abdomen and pelvis with IV contrast on 01/01/2011 were negative for evidence of recurrent lymphoma.  2. PET scan from 08/22/2010 showed no evidence for recurrent lymphoma.  3. CT scan of chest, abdomen and pelvis with IV contrast on 01/01/2011 showed no evidence for lymphoma.  4. CT scan of abdomen and pelvis on 07/01/2011 showed interval development of peritoneal disease worrisome for recurrence of  lymphoma. There was new right external iliac lymph node pathologically enlarged and worrisome for recurrent lymphoma. There was some stable appearance of mild soft tissue stranding surrounding the celiac trunk and superior mesenteric artery.  5. Chest x-ray, 2 view, from 09/02/2011 showed no acute findings.  6. CT scan of abdomen and pelvis with IV contrast on 09/02/2011 showed interval progression of lymphadenopathy in the abdomen and pelvis.  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 The 1.1 x 1.7 cm omental nodule which was measured on the prior study of 07/01/2011 now measures 4.1 x 2.4 cm. A 2nd discrete soft tissue nodule in the omentum which previously measured 1.1 x 1.5 cm now measures 2.2 x 3.0 cm.  7. CT scan of abdomen and pelvis with IV contrast on 01/08/2012 showed no residual measurable mass along the anterior aspect of the right diaphragm at the site of the previously demonstrated 4.9 x 2.1 cm nodule. There is no residual measurable gastrohepatic or celiac lymphadenopathy. There is  some soft tissue stranding around the proximal abdominal aorta, celiac trunk and superior mesenteric artery as noted on images 27-34. Pelvic lymphadenopathy is nearly completely resolved. A right external iliac node measuring 6 mm on image 66 previously measured 9 mm. Another node which previously measured 17 mm short axis now measures 8 mm on image 70. There is no residual inguinal adenopathy. The spleen remains normal in size, demonstrates no focal abnormalities. There are no suspect osseous findings. There is no residual mass at the left posterior costophrenic angle. The final impression was near complete response to therapy in the lymphadenopathy and omental disease with some minimal residual measurable disease as previously noted. There was an enlarged left inguinal hernia containing a knuckle of sigmoid colon with no evidence for incarceration or bowel obstruction. There was a nonobstructing left renal calculus.  8. CT scan of abdomen and pelvis with IV contrast on 06/19/2012 was compared with the scan of 01/08/2012 and showed stable plaque-like soft tissue density in the upper abdominal retroperitoneum, which had the appearance of treated lymphoma. No new or progressive disease within the abdomen or pelvis was noted. There is a stable incidental finding of a small left inguinal hernia and diverticulosis.   Impression/Plan: 1. Mantle cell lymphoma -  Mr. Kohlenberg continues to do well with the current treatment program with minimal side effects.  He is complaining of neuropathy which may be multifactorial including peripheral vascular disease and lower extremity edema.  His last CT scan  on 06/19/2012 shows no significant changes when compared with the prior CT scan of 01/08/2012.  He will continue with the current chemotherapy program. Today, Mr. Delancey will receive Velcade 2.75 mg, Cytoxan 400 mg, and Decadron 20 mg IV.  He is scheduled to receive Rituxan 800 mg IV on 08/24/2012. The Rituxan will be will  be continued every 3 weeks. Chemotherapy with the other 3 agents will be continued weekly.   2. Peripheral vascular disease  - Mr. Lewers has received a referral to discuss the RLE hyperpigmentation with a vascular surgeon.  A referral was made to Dr. Josephina Gip of [facility] and I confirmed receipt of the referral with Juliette Alcide at [facility].  The patient was instructed to elevate his lower extremities as much as possible.   3. Right lower extremity skin infection - Mr. Nou was asked to keep the wound open to air, clean and dry.  He was given a prescription for Keflex 500 mg PO BID x 7 days.  Mr. Sassone was also asked to show the wound to his PCP if it does gets worse or does not resolve after the antibiotic course.     Mr. Roskelley will continue with weekly CBCs   We plan to see Mr. Marcello again on 09/16/2012 at which time we will check CBC, chemistries and a LDH.   Mr. Launer is also scheduled to receive a chest x-ray on 09/16/2012 prior to his office visit.   Mr. And Mrs. Sahni are encourage to contact us in the interim with any questions or concerns.     Larina Bras, NP-C 08/10/2012, 1:32 PM

## 2012-08-10 NOTE — Telephone Encounter (Signed)
appts made and printed for pt  °

## 2012-08-10 NOTE — Telephone Encounter (Signed)
Per staff phone call and POF I have scheduled appts. JMW  

## 2012-08-10 NOTE — Patient Instructions (Addendum)
Please contact us at [phone] if you have any questions or concerns.  Finish your entire course of antibiotic therapy Take all medications as prescribed Warm compresses to right leg and elevated both legs as much as possible.

## 2012-08-11 ENCOUNTER — Encounter (INDEPENDENT_AMBULATORY_CARE_PROVIDER_SITE_OTHER): Payer: Medicare Other | Admitting: *Deleted

## 2012-08-11 ENCOUNTER — Encounter: Payer: Self-pay | Admitting: Vascular Surgery

## 2012-08-11 ENCOUNTER — Other Ambulatory Visit: Payer: Self-pay | Admitting: *Deleted

## 2012-08-11 ENCOUNTER — Ambulatory Visit (INDEPENDENT_AMBULATORY_CARE_PROVIDER_SITE_OTHER): Payer: Medicare Other | Admitting: Vascular Surgery

## 2012-08-11 ENCOUNTER — Telehealth: Payer: Self-pay | Admitting: *Deleted

## 2012-08-11 VITALS — BP 126/69 | HR 69 | Resp 16 | Ht 68.0 in | Wt 206.0 lb

## 2012-08-11 DIAGNOSIS — L97909 Non-pressure chronic ulcer of unspecified part of unspecified lower leg with unspecified severity: Secondary | ICD-10-CM

## 2012-08-11 DIAGNOSIS — I739 Peripheral vascular disease, unspecified: Secondary | ICD-10-CM

## 2012-08-11 NOTE — Progress Notes (Signed)
Subjective:     Patient ID: Martin Morris, male   DOB: 1932-12-31, 78 y.o.   MRN: [id number]  HPI this 78 year old male is referred by Dr. Johney Frame in the cancer center for possible vascular occlusive disease. Patient traumatized his pretibial region on the right a few weeks ago while putting up Christmas decorations and has a pretibial abrasion which has been slowly healing. A week later he noticed some bluish discoloration on the dorsum of the foot and over toes 23 and 4. This is painless. He has a history of gangrene infection cellulitis or nonhealing ulcers. He does not have claudication in his lower extremities. He has no complaints in the left leg. He does take chemotherapy for lymphoma and has for 4-5 years. Also has history of a malignant tumor in his thymus gland which was removed by Dr. Donata Clay  Past Medical History  Diagnosis Date  . thymus ca dx'd 2003    Radiation comp 2003  . NHL (non-Hodgkin's lymphoma) dx'd 2008    Chemo comp 10/2010; rituxin comp 10/2010  . Hypertension   . CAD 11/12/2007     (Cath 7/11 Patent LIMA to the LAD. Previously known occlusion of a saphenous vein graft to diagonal and    sequential to obtuse marginals. Severe diffuse native obtuse marginal and diagonal branch vessel disease.    Preserved ejection fraction.)  . HYPERLIPIDEMIA 11/12/2007  . HYPERTENSION 11/12/2007  . PERIPHERAL VASCULAR DISEASE 11/12/2007  . ARTHRITIS 11/12/2007  . BENIGN PROSTATIC HYPERTROPHY, HX OF 11/12/2007  . 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 Edema 10/17/2010  . INGUINAL HERNIAS, BILATERAL 11/10/2006  . MANTLE CELL LYMPHOMA INTRA-ABDOMINAL LYMPH NODES 11/12/2007  . Obesity, unspecified 07/05/2009  . SLEEP APNEA 11/12/2007  . History of PTCA 2005  . Osteoarthritis   . Carotid stenosis, bilateral   . Nephrolithiasis     History  Substance Use Topics  . Smoking status: Never Smoker   . Smokeless tobacco: Never Used  . Alcohol Use: No    Family History  Problem Relation Age of  Onset  . Coronary artery disease Mother     died at 90  . Heart attack Sister     died at age 59  . Heart attack Brother     Allergies  Allergen Reactions  . Atorvastatin     Current outpatient prescriptions:acyclovir (ZOVIRAX) 400 MG tablet, Take 1 tablet (400 mg total) by mouth 2 (two) times daily., Disp: 60 tablet, Rfl: 6;  amLODipine (NORVASC) 2.5 MG tablet, Take 2.5 mg by mouth daily.  , Disp: , Rfl: ;  aspirin 81 MG tablet, Take 81 mg by mouth every other day., Disp: , Rfl: ;  atenolol (TENORMIN) 50 MG tablet, TAKE ONE TABLET BY MOUTH EVERY DAY, Disp: 30 tablet, Rfl: 6 cephALEXin (KEFLEX) 500 MG capsule, Take 1 capsule (500 mg total) by mouth 2 (two) times daily., Disp: 14 capsule, Rfl: 0;  citalopram (CELEXA) 20 MG tablet, TAKE ONE TABLET BY MOUTH EVERY DAY, Disp: 30 tablet, Rfl: 3;  clopidogrel (PLAVIX) 75 MG tablet, TAKE ONE TABLET BY MOUTH EVERY DAY, Disp: 30 tablet, Rfl: 5;  furosemide (LASIX) 20 MG tablet, Take 20 mg by mouth daily as needed. For fluid., Disp: , Rfl:  isosorbide mononitrate (IMDUR) 60 MG 24 hr tablet, TAKE ONE TABLET BY MOUTH EVERY DAY, Disp: 30 tablet, Rfl: 5;  lidocaine-prilocaine (EMLA) cream, Apply to port 1 hour before treatment. Cover site with plastic wrap., Disp: 30 g, Rfl: 11;  naproxen sodium (ANAPROX)  220 MG tablet, Take 440 mg by mouth 2 (two) times daily as needed. For pain., Disp: , Rfl:  nitroGLYCERIN (NITROSTAT) 0.4 MG SL tablet, Place 1 tablet (0.4 mg total) under the tongue every 5 (five) minutes as needed. For chest pain., Disp: 25 tablet, Rfl: prn;  pravastatin (PRAVACHOL) 40 MG tablet, TAKE TWO TABLETS BY MOUTH AT BEDTIME, Disp: 60 tablet, Rfl: 5;  PRESCRIPTION MEDICATION, Inject into the vein once a week. Velcade and Cytoxan., Disp: , Rfl:  sulfamethoxazole-trimethoprim (BACTRIM DS) 800-160 MG per tablet, TAKE ONE TABLET BY MOUTH EVERY DAY AS  DIRECTED.  TAKE  1  TABLET  ON  MONDAY,  WEDNESDAY  AND  FRIDAY, Disp: 24 tablet, Rfl: 1 No current  facility-administered medications for this visit. Facility-Administered Medications Ordered in Other Visits: sodium chloride 0.9 % injection 10 mL, 10 mL, Intracatheter, PRN, Samul Dada, MD, 10 mL at 08/03/12 1419  BP 126/69  Pulse 69  Resp 16  Ht 5\' 8"  (1.727 m)  Wt 206 lb (93.441 kg)  BMI 31.32 kg/m2  Body mass index is 31.32 kg/(m^2).          Review of Systems     Objective:   Physical ExamBP 126/69  Pulse 69  Resp 16  Ht 5\' 8"  (1.727 m)  Wt 206 lb (93.441 kg)  BMI 31.32 kg/m2 General well-developed well-nourished male no apparent distress Lungs no rhonchi or wheezing Right lower extremity with 3+ popliteal and posterior tibial pulse palpable right foot well-perfused. There is a 1.5 x 2 cm abrasion in the right pretibial area. There is no bone exposed. There is bluish discoloration on the dorsum of the right foot at near the base of the second third and fourth toes with some discoloration on the toes. There is no ulceration. There is no cellulitis or gangrene. There is no drainage.  Today I ordered lower extremity arterial Dopplers which are reviewed and interpreted. They're totally normal with normal triphasic waveforms and ABI is exceeding 1.0 bilaterally     Assessment:     Abrasion right pretibial area and bluish discoloration dorsum right foot-etiology unknown Normal vascular exam    Plan:     Patient does not need any further vascular workup-normal arterial exam

## 2012-08-11 NOTE — Telephone Encounter (Signed)
Per staff message I have moved appt from 1/20 to 1/22.  JMW

## 2012-08-13 ENCOUNTER — Telehealth: Payer: Self-pay | Admitting: Oncology

## 2012-08-13 NOTE — Telephone Encounter (Signed)
called and s/w pts wife and she is aware of the 09/16/12 appt and she will get a new sch on 12/23       anne

## 2012-08-17 ENCOUNTER — Other Ambulatory Visit (HOSPITAL_BASED_OUTPATIENT_CLINIC_OR_DEPARTMENT_OTHER): Payer: Medicare Other | Admitting: Lab

## 2012-08-17 ENCOUNTER — Telehealth: Payer: Self-pay | Admitting: Oncology

## 2012-08-17 ENCOUNTER — Ambulatory Visit (HOSPITAL_BASED_OUTPATIENT_CLINIC_OR_DEPARTMENT_OTHER): Payer: Medicare Other

## 2012-08-17 VITALS — BP 147/75 | HR 61 | Temp 97.9°F | Resp 18

## 2012-08-17 DIAGNOSIS — I739 Peripheral vascular disease, unspecified: Secondary | ICD-10-CM

## 2012-08-17 DIAGNOSIS — Z5112 Encounter for antineoplastic immunotherapy: Secondary | ICD-10-CM

## 2012-08-17 DIAGNOSIS — L089 Local infection of the skin and subcutaneous tissue, unspecified: Secondary | ICD-10-CM

## 2012-08-17 DIAGNOSIS — C8313 Mantle cell lymphoma, intra-abdominal lymph nodes: Secondary | ICD-10-CM

## 2012-08-17 LAB — CBC WITH DIFFERENTIAL/PLATELET
BASO%: 0.5 % (ref 0.0–2.0)
Basophils Absolute: 0 10*3/uL (ref 0.0–0.1)
EOS%: 6.7 % (ref 0.0–7.0)
HCT: 33.8 % — ABNORMAL LOW (ref 38.4–49.9)
HGB: 11.2 g/dL — ABNORMAL LOW (ref 13.0–17.1)
MCHC: 33.1 g/dL (ref 32.0–36.0)
MONO#: 0.6 10*3/uL (ref 0.1–0.9)
NEUT%: 48 % (ref 39.0–75.0)
RDW: 13.4 % (ref 11.0–14.6)
WBC: 3.7 10*3/uL — ABNORMAL LOW (ref 4.0–10.3)
lymph#: 1 10*3/uL (ref 0.9–3.3)

## 2012-08-17 MED ORDER — ONDANSETRON 8 MG/50ML IVPB (CHCC)
8.0000 mg | Freq: Once | INTRAVENOUS | Status: AC
  Administered 2012-08-17: 8 mg via INTRAVENOUS

## 2012-08-17 MED ORDER — SODIUM CHLORIDE 0.9 % IV SOLN
190.0000 mg/m2 | Freq: Once | INTRAVENOUS | Status: AC
  Administered 2012-08-17: 400 mg via INTRAVENOUS
  Filled 2012-08-17: qty 20

## 2012-08-17 MED ORDER — HEPARIN SOD (PORK) LOCK FLUSH 100 UNIT/ML IV SOLN
500.0000 [IU] | Freq: Once | INTRAVENOUS | Status: AC | PRN
  Administered 2012-08-17: 500 [IU]
  Filled 2012-08-17: qty 5

## 2012-08-17 MED ORDER — DEXAMETHASONE SODIUM PHOSPHATE 10 MG/ML IJ SOLN: 20.0000 mg | Freq: Once | INTRAMUSCULAR | Status: DC

## 2012-08-17 MED ORDER — BORTEZOMIB CHEMO SQ INJECTION 3.5 MG (2.5MG/ML)
1.3000 mg/m2 | Freq: Once | INTRAMUSCULAR | Status: AC
  Administered 2012-08-17: 2.75 mg via SUBCUTANEOUS
  Filled 2012-08-17: qty 2.75

## 2012-08-17 MED ORDER — SODIUM CHLORIDE 0.9 % IJ SOLN
10.0000 mL | INTRAMUSCULAR | Status: DC | PRN
  Administered 2012-08-17: 10 mL
  Filled 2012-08-17: qty 10

## 2012-08-17 MED ORDER — SODIUM CHLORIDE 0.9 % IV SOLN
Freq: Once | INTRAVENOUS | Status: AC
  Administered 2012-08-17: 20 mL via INTRAVENOUS

## 2012-08-17 NOTE — Telephone Encounter (Signed)
Pt came in and needed a print out of schedule...printed Dec and Jan 2014 schedule and gv to pt.

## 2012-08-24 ENCOUNTER — Ambulatory Visit (HOSPITAL_BASED_OUTPATIENT_CLINIC_OR_DEPARTMENT_OTHER): Payer: Medicare Other

## 2012-08-24 ENCOUNTER — Other Ambulatory Visit (HOSPITAL_BASED_OUTPATIENT_CLINIC_OR_DEPARTMENT_OTHER): Payer: Medicare Other | Admitting: Lab

## 2012-08-24 VITALS — BP 165/85 | HR 59 | Temp 97.4°F | Resp 18

## 2012-08-24 DIAGNOSIS — I739 Peripheral vascular disease, unspecified: Secondary | ICD-10-CM

## 2012-08-24 DIAGNOSIS — L089 Local infection of the skin and subcutaneous tissue, unspecified: Secondary | ICD-10-CM

## 2012-08-24 DIAGNOSIS — C8313 Mantle cell lymphoma, intra-abdominal lymph nodes: Secondary | ICD-10-CM

## 2012-08-24 DIAGNOSIS — Z5112 Encounter for antineoplastic immunotherapy: Secondary | ICD-10-CM

## 2012-08-24 DIAGNOSIS — Z5111 Encounter for antineoplastic chemotherapy: Secondary | ICD-10-CM

## 2012-08-24 LAB — CBC WITH DIFFERENTIAL/PLATELET
Basophils Absolute: 0 10*3/uL (ref 0.0–0.1)
Eosinophils Absolute: 0.3 10*3/uL (ref 0.0–0.5)
HCT: 35.2 % — ABNORMAL LOW (ref 38.4–49.9)
HGB: 11.6 g/dL — ABNORMAL LOW (ref 13.0–17.1)
MCH: 34.2 pg — ABNORMAL HIGH (ref 27.2–33.4)
NEUT#: 2 10*3/uL (ref 1.5–6.5)
NEUT%: 47.8 % (ref 39.0–75.0)
RDW: 13.5 % (ref 11.0–14.6)
lymph#: 1.1 10*3/uL (ref 0.9–3.3)

## 2012-08-24 MED ORDER — SODIUM CHLORIDE 0.9 % IV SOLN
190.0000 mg/m2 | Freq: Once | INTRAVENOUS | Status: AC
  Administered 2012-08-24: 400 mg via INTRAVENOUS
  Filled 2012-08-24: qty 20

## 2012-08-24 MED ORDER — ACETAMINOPHEN 325 MG PO TABS
650.0000 mg | ORAL_TABLET | Freq: Once | ORAL | Status: AC
  Administered 2012-08-24: 650 mg via ORAL

## 2012-08-24 MED ORDER — ONDANSETRON 8 MG/50ML IVPB (CHCC)
8.0000 mg | Freq: Once | INTRAVENOUS | Status: AC
  Administered 2012-08-24: 8 mg via INTRAVENOUS

## 2012-08-24 MED ORDER — SODIUM CHLORIDE 0.9 % IJ SOLN
10.0000 mL | INTRAMUSCULAR | Status: DC | PRN
  Administered 2012-08-24: 10 mL
  Filled 2012-08-24: qty 10

## 2012-08-24 MED ORDER — HEPARIN SOD (PORK) LOCK FLUSH 100 UNIT/ML IV SOLN
500.0000 [IU] | Freq: Once | INTRAVENOUS | Status: AC | PRN
  Administered 2012-08-24: 500 [IU]
  Filled 2012-08-24: qty 5

## 2012-08-24 MED ORDER — SODIUM CHLORIDE 0.9 % IV SOLN
Freq: Once | INTRAVENOUS | Status: AC
  Administered 2012-08-24: 14:00:00 via INTRAVENOUS

## 2012-08-24 MED ORDER — BORTEZOMIB CHEMO SQ INJECTION 3.5 MG (2.5MG/ML)
1.3000 mg/m2 | Freq: Once | INTRAMUSCULAR | Status: AC
  Administered 2012-08-24: 2.75 mg via SUBCUTANEOUS
  Filled 2012-08-24: qty 2.75

## 2012-08-24 MED ORDER — DIPHENHYDRAMINE HCL 25 MG PO CAPS
25.0000 mg | ORAL_CAPSULE | Freq: Once | ORAL | Status: AC
  Administered 2012-08-24: 25 mg via ORAL

## 2012-08-24 MED ORDER — DEXAMETHASONE SODIUM PHOSPHATE 10 MG/ML IJ SOLN
20.0000 mg | Freq: Once | INTRAMUSCULAR | Status: AC
  Administered 2012-08-24: 20 mg via INTRAVENOUS

## 2012-08-24 MED ORDER — SODIUM CHLORIDE 0.9 % IV SOLN
375.0000 mg/m2 | Freq: Once | INTRAVENOUS | Status: AC
  Administered 2012-08-24: 800 mg via INTRAVENOUS
  Filled 2012-08-24: qty 80

## 2012-08-24 NOTE — Patient Instructions (Signed)
[facility] Discharge Instructions for Patients Receiving Chemotherapy  Today you received the following chemotherapy agents Rituxan/Cytoxan/Velcade To help prevent nausea and vomiting after your treatment, we encourage you to take your nausea medication as prescribed.  If you develop nausea and vomiting that is not controlled by your nausea medication, call the clinic. If it is after clinic hours your family physician or the after hours number for the clinic or go to the Emergency Department.   BELOW ARE SYMPTOMS THAT SHOULD BE REPORTED IMMEDIATELY:  *FEVER GREATER THAN 100.5 F  *CHILLS WITH OR WITHOUT FEVER  NAUSEA AND VOMITING THAT IS NOT CONTROLLED WITH YOUR NAUSEA MEDICATION  *UNUSUAL SHORTNESS OF BREATH  *UNUSUAL BRUISING OR BLEEDING  TENDERNESS IN MOUTH AND THROAT WITH OR WITHOUT PRESENCE OF ULCERS  *URINARY PROBLEMS  *BOWEL PROBLEMS  UNUSUAL RASH Items with * indicate a potential emergency and should be followed up as soon as possible.  One of the nurses will contact you 24 hours after your treatment. Please let the nurse know about any problems that you may have experienced. Feel free to call the clinic you have any questions or concerns. The clinic phone number is [phone].   I have been informed and understand all the instructions given to me. I know to contact the clinic, my physician, or go to the Emergency Department if any problems should occur. I do not have any questions at this time, but understand that I may call the clinic during office hours   should I have any questions or need assistance in obtaining follow up care.    __________________________________________  _____________  __________ Signature of Patient or Authorized Representative            Date                   Time    __________________________________________ Nurse's Signature

## 2012-08-31 ENCOUNTER — Ambulatory Visit (HOSPITAL_BASED_OUTPATIENT_CLINIC_OR_DEPARTMENT_OTHER): Payer: Medicare Other

## 2012-08-31 ENCOUNTER — Other Ambulatory Visit (HOSPITAL_BASED_OUTPATIENT_CLINIC_OR_DEPARTMENT_OTHER): Payer: Medicare Other | Admitting: Lab

## 2012-08-31 VITALS — BP 154/77 | HR 55 | Temp 96.7°F

## 2012-08-31 DIAGNOSIS — Z5112 Encounter for antineoplastic immunotherapy: Secondary | ICD-10-CM

## 2012-08-31 DIAGNOSIS — C8313 Mantle cell lymphoma, intra-abdominal lymph nodes: Secondary | ICD-10-CM

## 2012-08-31 DIAGNOSIS — L089 Local infection of the skin and subcutaneous tissue, unspecified: Secondary | ICD-10-CM

## 2012-08-31 DIAGNOSIS — I739 Peripheral vascular disease, unspecified: Secondary | ICD-10-CM

## 2012-08-31 LAB — CBC WITH DIFFERENTIAL/PLATELET
BASO%: 0.5 % (ref 0.0–2.0)
Eosinophils Absolute: 0.2 10*3/uL (ref 0.0–0.5)
HCT: 34.6 % — ABNORMAL LOW (ref 38.4–49.9)
LYMPH%: 28.7 % (ref 14.0–49.0)
MCHC: 33.5 g/dL (ref 32.0–36.0)
MONO#: 0.8 10*3/uL (ref 0.1–0.9)
NEUT#: 1.8 10*3/uL (ref 1.5–6.5)
NEUT%: 44.1 % (ref 39.0–75.0)
Platelets: 143 10*3/uL (ref 140–400)
WBC: 4 10*3/uL (ref 4.0–10.3)
lymph#: 1.1 10*3/uL (ref 0.9–3.3)
nRBC: 0 % (ref 0–0)

## 2012-08-31 MED ORDER — DEXAMETHASONE SODIUM PHOSPHATE 10 MG/ML IJ SOLN
20.0000 mg | Freq: Once | INTRAMUSCULAR | Status: AC
  Administered 2012-08-31: 20 mg via INTRAVENOUS

## 2012-08-31 MED ORDER — SODIUM CHLORIDE 0.9 % IV SOLN
Freq: Once | INTRAVENOUS | Status: AC
  Administered 2012-08-31: 10:00:00 via INTRAVENOUS

## 2012-08-31 MED ORDER — BORTEZOMIB CHEMO SQ INJECTION 3.5 MG (2.5MG/ML)
1.3000 mg/m2 | Freq: Once | INTRAMUSCULAR | Status: AC
  Administered 2012-08-31: 2.75 mg via SUBCUTANEOUS
  Filled 2012-08-31: qty 2.75

## 2012-08-31 MED ORDER — SODIUM CHLORIDE 0.9 % IV SOLN
190.0000 mg/m2 | Freq: Once | INTRAVENOUS | Status: AC
  Administered 2012-08-31: 400 mg via INTRAVENOUS
  Filled 2012-08-31: qty 20

## 2012-08-31 MED ORDER — SODIUM CHLORIDE 0.9 % IJ SOLN
10.0000 mL | INTRAMUSCULAR | Status: DC | PRN
Start: 2012-08-31 — End: 2012-08-31
  Administered 2012-08-31: 10 mL
  Filled 2012-08-31: qty 10

## 2012-08-31 MED ORDER — ONDANSETRON 8 MG/50ML IVPB (CHCC)
8.0000 mg | Freq: Once | INTRAVENOUS | Status: AC
  Administered 2012-08-31: 8 mg via INTRAVENOUS

## 2012-08-31 MED ORDER — HEPARIN SOD (PORK) LOCK FLUSH 100 UNIT/ML IV SOLN
500.0000 [IU] | Freq: Once | INTRAVENOUS | Status: AC | PRN
  Administered 2012-08-31: 500 [IU]
  Filled 2012-08-31: qty 5

## 2012-08-31 NOTE — Patient Instructions (Signed)
[facility] Discharge Instructions for Patients Receiving Chemotherapy  Today you received the following chemotherapy agents: cytoxan  To help prevent nausea and vomiting after your treatment, we encourage you to take your nausea medication.  Take it as often as prescribed.     If you develop nausea and vomiting that is not controlled by your nausea medication, call the clinic. If it is after clinic hours your family physician or the after hours number for the clinic or go to the Emergency Department.   BELOW ARE SYMPTOMS THAT SHOULD BE REPORTED IMMEDIATELY:  *FEVER GREATER THAN 100.5 F  *CHILLS WITH OR WITHOUT FEVER  NAUSEA AND VOMITING THAT IS NOT CONTROLLED WITH YOUR NAUSEA MEDICATION  *UNUSUAL SHORTNESS OF BREATH  *UNUSUAL BRUISING OR BLEEDING  TENDERNESS IN MOUTH AND THROAT WITH OR WITHOUT PRESENCE OF ULCERS  *URINARY PROBLEMS  *BOWEL PROBLEMS  UNUSUAL RASH Items with * indicate a potential emergency and should be followed up as soon as possible.  Feel free to call the clinic you have any questions or concerns. The clinic phone number is [phone].   I have been informed and understand all the instructions given to me. I know to contact the clinic, my physician, or go to the Emergency Department if any problems should occur. I do not have any questions at this time, but understand that I may call the clinic during office hours   should I have any questions or need assistance in obtaining follow up care.    __________________________________________  _____________  __________ Signature of Patient or Authorized Representative            Date                   Time    __________________________________________ Nurse's Signature

## 2012-09-07 ENCOUNTER — Ambulatory Visit (HOSPITAL_COMMUNITY)
Admission: RE | Admit: 2012-09-07 | Discharge: 2012-09-07 | Disposition: A | Discharge status: Home / Self Care | Payer: Medicare Other | Source: Ambulatory Visit | Attending: Family | Admitting: Family

## 2012-09-07 ENCOUNTER — Other Ambulatory Visit (HOSPITAL_BASED_OUTPATIENT_CLINIC_OR_DEPARTMENT_OTHER): Payer: Medicare Other | Admitting: Lab

## 2012-09-07 ENCOUNTER — Ambulatory Visit (HOSPITAL_BASED_OUTPATIENT_CLINIC_OR_DEPARTMENT_OTHER): Payer: Medicare Other

## 2012-09-07 DIAGNOSIS — C8319 Mantle cell lymphoma, extranodal and solid organ sites: Secondary | ICD-10-CM | POA: Insufficient documentation

## 2012-09-07 DIAGNOSIS — J9819 Other pulmonary collapse: Secondary | ICD-10-CM | POA: Insufficient documentation

## 2012-09-07 DIAGNOSIS — C8313 Mantle cell lymphoma, intra-abdominal lymph nodes: Secondary | ICD-10-CM

## 2012-09-07 DIAGNOSIS — I739 Peripheral vascular disease, unspecified: Secondary | ICD-10-CM

## 2012-09-07 DIAGNOSIS — Z5111 Encounter for antineoplastic chemotherapy: Secondary | ICD-10-CM

## 2012-09-07 DIAGNOSIS — Z5112 Encounter for antineoplastic immunotherapy: Secondary | ICD-10-CM

## 2012-09-07 DIAGNOSIS — S80819A Abrasion, unspecified lower leg, initial encounter: Secondary | ICD-10-CM

## 2012-09-07 DIAGNOSIS — L089 Local infection of the skin and subcutaneous tissue, unspecified: Secondary | ICD-10-CM

## 2012-09-07 LAB — TECHNOLOGIST REVIEW: Technologist Review: 2

## 2012-09-07 LAB — CBC WITH DIFFERENTIAL/PLATELET
Basophils Absolute: 0 10*3/uL (ref 0.0–0.1)
EOS%: 6.4 % (ref 0.0–7.0)
HCT: 35.6 % — ABNORMAL LOW (ref 38.4–49.9)
HGB: 11.9 g/dL — ABNORMAL LOW (ref 13.0–17.1)
LYMPH%: 28.1 % (ref 14.0–49.0)
MCH: 34.7 pg — ABNORMAL HIGH (ref 27.2–33.4)
MCV: 103.8 fL — ABNORMAL HIGH (ref 79.3–98.0)
NEUT%: 47.1 % (ref 39.0–75.0)
Platelets: 135 10*3/uL — ABNORMAL LOW (ref 140–400)
lymph#: 1.1 10*3/uL (ref 0.9–3.3)

## 2012-09-07 MED ORDER — SODIUM CHLORIDE 0.9 % IV SOLN
190.0000 mg/m2 | Freq: Once | INTRAVENOUS | Status: AC
  Administered 2012-09-07: 400 mg via INTRAVENOUS
  Filled 2012-09-07: qty 20

## 2012-09-07 MED ORDER — DEXAMETHASONE SODIUM PHOSPHATE 4 MG/ML IJ SOLN
20.0000 mg | Freq: Once | INTRAMUSCULAR | Status: AC
  Administered 2012-09-07: 20 mg via INTRAVENOUS
  Filled 2012-09-07: qty 5

## 2012-09-07 MED ORDER — SODIUM CHLORIDE 0.9 % IJ SOLN
10.0000 mL | INTRAMUSCULAR | Status: DC | PRN
  Administered 2012-09-07: 10 mL
  Filled 2012-09-07: qty 10

## 2012-09-07 MED ORDER — SODIUM CHLORIDE 0.9 % IV SOLN
Freq: Once | INTRAVENOUS | Status: AC
  Administered 2012-09-07: 13:00:00 via INTRAVENOUS

## 2012-09-07 MED ORDER — BORTEZOMIB CHEMO SQ INJECTION 3.5 MG (2.5MG/ML)
1.3000 mg/m2 | Freq: Once | INTRAMUSCULAR | Status: AC
  Administered 2012-09-07: 2.75 mg via SUBCUTANEOUS
  Filled 2012-09-07: qty 2.75

## 2012-09-07 MED ORDER — ONDANSETRON 8 MG/50ML IVPB (CHCC)
8.0000 mg | Freq: Once | INTRAVENOUS | Status: AC
  Administered 2012-09-07: 8 mg via INTRAVENOUS

## 2012-09-07 MED ORDER — HEPARIN SOD (PORK) LOCK FLUSH 100 UNIT/ML IV SOLN
500.0000 [IU] | Freq: Once | INTRAVENOUS | Status: AC | PRN
  Administered 2012-09-07: 500 [IU]
  Filled 2012-09-07: qty 5

## 2012-09-07 NOTE — Progress Notes (Signed)
Pt discharged to home with no complaints.  

## 2012-09-12 ENCOUNTER — Other Ambulatory Visit: Payer: Self-pay | Admitting: Oncology

## 2012-09-14 ENCOUNTER — Ambulatory Visit: Payer: Medicare Other

## 2012-09-16 ENCOUNTER — Ambulatory Visit: Payer: Medicare Other | Admitting: Oncology

## 2012-09-16 ENCOUNTER — Other Ambulatory Visit: Payer: Medicare Other | Admitting: Lab

## 2012-09-16 ENCOUNTER — Ambulatory Visit: Payer: Medicare Other

## 2012-09-16 ENCOUNTER — Telehealth: Payer: Self-pay
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
## 2012-09-16 NOTE — Telephone Encounter (Signed)
S/w wife rescheduled to 1/24 at 0830 lab, 0900 DSM.

## 2012-09-18 ENCOUNTER — Encounter: Payer: Self-pay | Admitting: Oncology

## 2012-09-18 ENCOUNTER — Ambulatory Visit (HOSPITAL_BASED_OUTPATIENT_CLINIC_OR_DEPARTMENT_OTHER): Payer: Medicare Other

## 2012-09-18 ENCOUNTER — Telehealth: Payer: Self-pay | Admitting: *Deleted

## 2012-09-18 ENCOUNTER — Ambulatory Visit (HOSPITAL_BASED_OUTPATIENT_CLINIC_OR_DEPARTMENT_OTHER): Payer: Medicare Other | Admitting: Oncology

## 2012-09-18 ENCOUNTER — Other Ambulatory Visit (HOSPITAL_BASED_OUTPATIENT_CLINIC_OR_DEPARTMENT_OTHER): Payer: Medicare Other | Admitting: Lab

## 2012-09-18 VITALS — BP 173/86 | HR 60 | Temp 97.6°F | Resp 20 | Ht 68.0 in | Wt 208.1 lb

## 2012-09-18 VITALS — BP 132/67 | HR 59 | Temp 98.4°F | Resp 20

## 2012-09-18 DIAGNOSIS — I10 Essential (primary) hypertension: Secondary | ICD-10-CM

## 2012-09-18 DIAGNOSIS — C8313 Mantle cell lymphoma, intra-abdominal lymph nodes: Secondary | ICD-10-CM

## 2012-09-18 DIAGNOSIS — Z5112 Encounter for antineoplastic immunotherapy: Secondary | ICD-10-CM

## 2012-09-18 DIAGNOSIS — Z5111 Encounter for antineoplastic chemotherapy: Secondary | ICD-10-CM

## 2012-09-18 DIAGNOSIS — L089 Local infection of the skin and subcutaneous tissue, unspecified: Secondary | ICD-10-CM

## 2012-09-18 DIAGNOSIS — I739 Peripheral vascular disease, unspecified: Secondary | ICD-10-CM

## 2012-09-18 LAB — LACTATE DEHYDROGENASE (CC13): LDH: 303 U/L — ABNORMAL HIGH (ref 125–245)

## 2012-09-18 LAB — COMPREHENSIVE METABOLIC PANEL (CC13)
Albumin: 3.3 g/dL — ABNORMAL LOW (ref 3.5–5.0)
Alkaline Phosphatase: 67 U/L (ref 40–150)
BUN: 20.4 mg/dL (ref 7.0–26.0)
CO2: 26 mEq/L (ref 22–29)
Glucose: 153 mg/dl — ABNORMAL HIGH (ref 70–99)
Total Bilirubin: 0.71 mg/dL (ref 0.20–1.20)

## 2012-09-18 LAB — CBC WITH DIFFERENTIAL/PLATELET
Eosinophils Absolute: 0.3 10*3/uL (ref 0.0–0.5)
HCT: 36.1 % — ABNORMAL LOW (ref 38.4–49.9)
LYMPH%: 22.2 % (ref 14.0–49.0)
MONO#: 1 10*3/uL — ABNORMAL HIGH (ref 0.1–0.9)
NEUT#: 2.5 10*3/uL (ref 1.5–6.5)
Platelets: 217 10*3/uL (ref 140–400)
RBC: 3.47 10*6/uL — ABNORMAL LOW (ref 4.20–5.82)
WBC: 4.9 10*3/uL (ref 4.0–10.3)
lymph#: 1.1 10*3/uL (ref 0.9–3.3)
nRBC: 0 % (ref 0–0)

## 2012-09-18 MED ORDER — SODIUM CHLORIDE 0.9 % IV SOLN
Freq: Once | INTRAVENOUS | Status: AC
  Administered 2012-09-18: 12:00:00 via INTRAVENOUS

## 2012-09-18 MED ORDER — CYCLOPHOSPHAMIDE CHEMO INJECTION 1 GM
190.0000 mg/m2 | Freq: Once | INTRAMUSCULAR | Status: AC
  Administered 2012-09-18: 400 mg via INTRAVENOUS
  Filled 2012-09-18: qty 20

## 2012-09-18 MED ORDER — DEXAMETHASONE SODIUM PHOSPHATE 4 MG/ML IJ SOLN
20.0000 mg | Freq: Once | INTRAMUSCULAR | Status: AC
  Administered 2012-09-18: 20 mg via INTRAVENOUS

## 2012-09-18 MED ORDER — ONDANSETRON 8 MG/50ML IVPB (CHCC)
8.0000 mg | Freq: Once | INTRAVENOUS | Status: AC
  Administered 2012-09-18: 8 mg via INTRAVENOUS

## 2012-09-18 MED ORDER — DIPHENHYDRAMINE HCL 25 MG PO CAPS
25.0000 mg | ORAL_CAPSULE | Freq: Once | ORAL | Status: AC
  Administered 2012-09-18: 25 mg via ORAL

## 2012-09-18 MED ORDER — RITUXIMAB CHEMO INJECTION 10 MG/ML
375.0000 mg/m2 | Freq: Once | INTRAVENOUS | Status: AC
  Administered 2012-09-18: 800 mg via INTRAVENOUS
  Filled 2012-09-18: qty 80

## 2012-09-18 MED ORDER — ACETAMINOPHEN 325 MG PO TABS
650.0000 mg | ORAL_TABLET | Freq: Once | ORAL | Status: AC
  Administered 2012-09-18: 650 mg via ORAL

## 2012-09-18 MED ORDER — SODIUM CHLORIDE 0.9 % IJ SOLN
10.0000 mL | INTRAMUSCULAR | Status: DC | PRN
  Administered 2012-09-18: 10 mL
  Filled 2012-09-18: qty 10

## 2012-09-18 MED ORDER — BORTEZOMIB CHEMO SQ INJECTION 3.5 MG (2.5MG/ML)
1.3000 mg/m2 | Freq: Once | INTRAMUSCULAR | Status: AC
  Administered 2012-09-18: 2.75 mg via SUBCUTANEOUS
  Filled 2012-09-18: qty 2.75

## 2012-09-18 MED ORDER — HEPARIN SOD (PORK) LOCK FLUSH 100 UNIT/ML IV SOLN
500.0000 [IU] | Freq: Once | INTRAVENOUS | Status: AC | PRN
  Administered 2012-09-18: 500 [IU]
  Filled 2012-09-18: qty 5

## 2012-09-18 NOTE — Progress Notes (Signed)
This office note has been dictated.  #[id number]

## 2012-09-18 NOTE — Progress Notes (Signed)
1310--Rituxan Rapid Infusion started at 100mg /hr x 50ml

## 2012-09-18 NOTE — Progress Notes (Signed)
CC:   Martin Morris, M.D.  PROBLEM LIST:  1. Mantle cell lymphoma diagnosed in March 2008 with positive bone  marrow initially on 12/03/2006 and then negative bone marrow after  treatment in October 2009. As stated, the patient presented with  obstructive liver abnormalities requiring ERCP and non metal stent  placement by Dr. Melvia Heaps. The stent was subsequently removed  in October 2008. The patient was treated with 8 cycles of Rituxan,  Cytoxan, vincristine and Decadron in combination with Neulasta from  12/19/2006 through 05/15/2007. The patient then received  maintenance Rituxan from July 16, 2007 through February 01, 2010.  While on Rituxan, he had a recurrence of his disease by CT scan  carried out on 01/24/2010. Martin Morris then received treatment  with bendamustine and Rituxan in combination with Neulasta for 6  cycles from 02/19/2010 through 07/26/2010. We have a prior PET scan from  08/22/2010 and CT scans of chest, abdomen, and pelvis from 01/01/2011 that showed no evidence of disease. He has been off treatment since December 2011 and had been doing well without any symptoms until the CT scan of the abdomen and pelvis on 07/01/2011 showed signs of recurrence. CT scans of abdomen and pelvis with IVC on 09/02/11 show further progression.  Rituxan, subcutaneous Velcade, intravenous Cytoxan and Decadron  were initiated on 10/15/2011. CT scans of the abdomen and pelvis carried out on 01/08/2012 showed a near complete response to therapy. CT scan of the abdomen and pelvis with IV contrast carried out on 06/19/2012 showed stable plaque-like soft tissue density in the upper abdominal retroperitoneum compared with the CT scan of 01/08/2012.  2. Coronary artery disease, S/P 2 MIs, most recently 03/21/10. Heart cath on 03/22/10. LVEF is 40-50%.  3. Peripheral vascular disease.  4. Dyslipidemia.  5. Hypertension.  6. History of depression.  7. History of kidney stones.  8. History of  sleep apnea.  9. History of colonic polyps.  10. 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 Kathlee Nations Trigt, status post radiation treatments.  11. Easy bruising.  12  Diverticulosis seen on CT scans done 06/19/2012.  13. Right Port-A-Cath placed on 11/20/2011.    MEDICATIONS:  Medications were reviewed and recorded. Current Outpatient Prescriptions  Medication Sig Dispense Refill  . acyclovir (ZOVIRAX) 400 MG tablet Take 1 tablet (400 mg total) by mouth 2 (two) times daily.  60 tablet  6  . amLODipine (NORVASC) 2.5 MG tablet Take 2.5 mg by mouth daily.        Marland Kitchen aspirin 81 MG tablet Take 81 mg by mouth every other day.      Marland Kitchen atenolol (TENORMIN) 50 MG tablet TAKE ONE TABLET BY MOUTH EVERY DAY  30 tablet  6  . cephALEXin (KEFLEX) 500 MG capsule Take 1 capsule (500 mg total) by mouth 2 (two) times daily.  14 capsule  0  . citalopram (CELEXA) 20 MG tablet TAKE ONE TABLET BY MOUTH EVERY DAY  30 tablet  3  . clopidogrel (PLAVIX) 75 MG tablet TAKE ONE TABLET BY MOUTH EVERY DAY  30 tablet  5  . furosemide (LASIX) 20 MG tablet Take 20 mg by mouth daily as needed. For fluid.      . isosorbide mononitrate (IMDUR) 60 MG 24 hr tablet TAKE ONE TABLET BY MOUTH EVERY DAY  30 tablet  5  . lidocaine-prilocaine (EMLA) cream Apply to port 1 hour before treatment. Cover site with plastic wrap.  30 g  11  . naproxen sodium (ANAPROX) 220 MG tablet Take 440 mg by mouth 2 (two) times daily as needed. For pain.      . nitroGLYCERIN (NITROSTAT) 0.4 MG SL tablet Place 1 tablet (0.4 mg total) under the tongue every 5 (five) minutes as needed. For chest pain.  25 tablet  prn  . pravastatin (PRAVACHOL) 40 MG tablet TAKE TWO TABLETS BY MOUTH AT BEDTIME  60 tablet  5  . PRESCRIPTION MEDICATION Inject into the vein once a week. Velcade and Cytoxan.      . sulfamethoxazole-trimethoprim (BACTRIM DS) 800-160 MG per tablet TAKE ONE TABLET BY MOUTH EVERY DAY AS  DIRECTED.  TAKE  1  TABLET  ON   MONDAY,  WEDNESDAY  AND  FRIDAY  24 tablet  1   No current facility-administered medications for this visit.   Facility-Administered Medications Ordered in Other Visits  Medication Dose Route Frequency Provider Last Rate Last Dose  . sodium chloride 0.9 % injection 10 mL  10 mL Intracatheter PRN Samul Dada, MD   10 mL at 08/03/12 1419   IMMUNIZATIONS: Pneumovax was administered 09/06/2011.  Flu shot was administered in early December 2012 and on 05/13/2012.   SMOKING HISTORY: The patient has never smoked cigarettes.   HISTORY:  Martin Morris was seen today for followup of his relapsed mantle cell lymphoma, with original diagnosis going back to March 2008. Martin Morris was accompanied by his wife, [name].  He was last seen by [place] on 08/10/2012, and prior to that on 06/24/2012.  Martin Morris has continued to receive chemotherapy on a weekly basis. This consists of subcutaneous Velcade and IV Cytoxan and Decadron.  He also receives Rituxan every 3 weeks.  He has tolerated his treatments well.  Treatments were last administered on 09/07/2012, 08/31/2012, 08/24/2012, 08/17/2012, and on his last visit on 08/10/2012.  Martin Morris reports no significant changes in his condition.  He saw Dr. Jerilee Field regarding the discoloration of his toes and had arterial Doppler studies that apparently showed no arterial vascular disease. The patient is without complaints today.  His tolerance for chemotherapy has been excellent.   PHYSICAL EXAMINATION:  General:  He looks well.  Vital Signs:  Weight is 208.1 pounds, height 5 feet 8 inches, body surface area 2.13 meters squared.  Blood pressure today 173/86.  The patient was informed about his elevated blood pressure.  Other vital signs are normal.  HEENT: There is no scleral icterus.  Mouth and pharynx are benign.  There is no peripheral adenopathy palpable.  Chest:  The heart and lungs are normal. There is a right-sided Port-A-Cath.  Abdomen:   Basically obese, but benign.  I do not feel any induration in the area above the umbilicus, where we have felt some induration previously.  Extremities:  Continue to show 3+ edema of both lower legs.  Neurologic:  Exam is normal.  LABORATORY DATA:  Today white count is 4.9, ANC 2.5, hemoglobin 11.9, hematocrit 36.1, platelets 217,000.  There is an occasional metamyelocyte on smear.  In the past, myelocytes have been seen. Chemistries today are pending.  Chemistries from 06/24/2012 notable for an LDH of 321.  LDH has been minimally elevated in the past, specifically, 315 on 05/13/2012, 269 on 04/08/2012, 257 on 03/25/2012. The rest of the chemistries are normal.  IMAGING STUDIES:  1. CT scan of chest, abdomen and pelvis with IV contrast on 01/01/2011  were negative for evidence of recurrent lymphoma.  2. PET scan from  08/22/2010 showed no evidence for recurrent lymphoma.  3. CT scan of chest, abdomen and pelvis with IV contrast on 01/01/2011  showed no evidence for lymphoma.  4. CT scan of abdomen and pelvis on 07/01/2011 showed interval  development of peritoneal disease worrisome for recurrence of  lymphoma. There was new right external iliac lymph node  pathologically enlarged and worrisome for recurrent lymphoma.  There was some stable appearance of mild soft tissue stranding  surrounding the celiac trunk and superior mesenteric artery.  5. Chest x-ray, 2 view, from 09/02/2011 showed no acute findings.  6. CT scan of abdomen and pelvis with IV contrast on 09/02/2011 showed  interval progression of lymphadenopathy in the abdomen and pelvis.  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 The 1.1 x 1.7 cm omental nodule  which was measured on the prior study of 07/01/2011 now measures  4.1 x 2.4 cm. A 2nd discrete soft tissue nodule in the omentum  which previously measured 1.1 x 1.5 cm now measures 2.2 x 3.0 cm.  7. CT scan of abdomen and pelvis with IV contrast on 01/08/2012 showed no residual  measurable mass along the anterior aspect of the right diaphragm at the  site of the previously demonstrated 4.9 x 2.1 cm nodule. There is no  residual measurable gastrohepatic or celiac lymphadenopathy. There is  some soft tissue stranding around the proximal abdominal aorta, celiac  trunk and superior mesenteric artery as noted on images 27-34. Pelvic  lymphadenopathy is nearly completely resolved. A right external iliac  node measuring 6 mm on image 66 previously measured 9 mm. Another node  which previously measured 17 mm short axis now measures 8 mm on image  70. There is no residual inguinal adenopathy. The spleen remains  normal in size, demonstrates no focal abnormalities. There are no  suspect osseous findings. There is no residual mass at the left  posterior costophrenic Martin. The final impression was near complete  response to therapy in the lymphadenopathy and omental disease with some  minimal residual measurable disease as previously noted. There was an  enlarged left inguinal hernia containing a knuckle of sigmoid colon with  no evidence for incarceration or bowel obstruction. There was a  nonobstructing left renal calculus.  8. CT scan of abdomen and pelvis with IV contrast was compared with the  scan of 01/08/2012 and showed stable plaque-like soft tissue density in  the upper abdominal retroperitoneum, which had the appearance of treated  lymphoma. No new or progressive disease within the abdomen or pelvis  was noted. There is a stable incidental finding of a small left  inguinal hernia and diverticulosis. 9. Chest x-ray, 2-view, from 09/07/2012 showed low lung volumes with streaky basilar atelectasis.  IMPRESSION AND PLAN:  Martin Morris continues to do well on the current treatment program.  He is not having any significant side effects.  His last CT scan of the abdomen and pelvis was on 06/19/2012.  That showed no significant changes compared with the prior CT scan of  01/08/2012.  We will continue the same chemotherapy program as we have been doing, i.e., weekly treatment with subcutaneous Velcade 2.75 mg, IV Cytoxan 400 mg, IV Decadron 20 mg.  The patient is due for Rituxan today, 100 mg IV. Rituxan will be continued every 3 weeks and will be repeated again on 10/09/2012 and again on 10/30/2012.  We will plan to see Martin Morris again on 10/30/2012, at which time we will check CBC and chemistries. He will have a CBC  weekly prior to his chemotherapy.  I am anticipating obtaining another CT scan of the abdomen and pelvis toward the end of March or perhaps April.  If that CT scan is favorable, then we may consider trying to treat Martin Morris every 2 weeks, and with Rituxan every 4 weeks.    ______________________________ Samul Dada, M.D. DSM/MEDQ  D:  09/18/2012  T:  09/18/2012  Job:  [id number]

## 2012-09-18 NOTE — Telephone Encounter (Signed)
Per staff meeting and POF I have scheduled appts, but for 3/7. The MD appt is too late tin the afternoon for treatment. I have e-mail schedule to advise.  JMW

## 2012-09-18 NOTE — Patient Instructions (Addendum)
[facility] Discharge Instructions for Patients Receiving Chemotherapy  Today you received the following chemotherapy agents Rituxan, Cytoxan and Velcade injection.  To help prevent nausea and vomiting after your treatment, we encourage you to take your nausea medication as directed.   If you develop nausea and vomiting that is not controlled by your nausea medication, call the clinic. If it is after clinic hours your family physician or the after hours number for the clinic or go to the Emergency Department.   BELOW ARE SYMPTOMS THAT SHOULD BE REPORTED IMMEDIATELY:  *FEVER GREATER THAN 100.5 F  *CHILLS WITH OR WITHOUT FEVER  NAUSEA AND VOMITING THAT IS NOT CONTROLLED WITH YOUR NAUSEA MEDICATION  *UNUSUAL SHORTNESS OF BREATH  *UNUSUAL BRUISING OR BLEEDING  TENDERNESS IN MOUTH AND THROAT WITH OR WITHOUT PRESENCE OF ULCERS  *URINARY PROBLEMS  *BOWEL PROBLEMS  UNUSUAL RASH Items with * indicate a potential emergency and should be followed up as soon as possible.  Feel free to call the clinic you have any questions or concerns. The clinic phone number is [phone].

## 2012-09-21 ENCOUNTER — Ambulatory Visit (INDEPENDENT_AMBULATORY_CARE_PROVIDER_SITE_OTHER): Payer: Medicare Other | Admitting: Cardiology

## 2012-09-21 ENCOUNTER — Encounter: Payer: Self-pay | Admitting: Cardiology

## 2012-09-21 VITALS — BP 140/85 | HR 59 | Ht 69.0 in | Wt 206.2 lb

## 2012-09-21 DIAGNOSIS — I739 Peripheral vascular disease, unspecified: Secondary | ICD-10-CM

## 2012-09-21 DIAGNOSIS — I2510 Atherosclerotic heart disease of native coronary artery without angina pectoris: Secondary | ICD-10-CM

## 2012-09-21 DIAGNOSIS — E785 Hyperlipidemia, unspecified: Secondary | ICD-10-CM

## 2012-09-21 DIAGNOSIS — I10 Essential (primary) hypertension: Secondary | ICD-10-CM

## 2012-09-21 DIAGNOSIS — I25810 Atherosclerosis of coronary artery bypass graft(s) without angina pectoris: Secondary | ICD-10-CM

## 2012-09-21 NOTE — Patient Instructions (Addendum)
The current medical regimen is effective;  continue present plan and medications.  Follow up in 1 year with Dr Hochrein.  You will receive a letter in the mail 2 months before you are due.  Please call us when you receive this letter to schedule your follow up appointment.  

## 2012-09-21 NOTE — Progress Notes (Signed)
The patient presents for followup of his known coronary disease. Since I last saw him he has had no new cardiovascular complaints. Unfortunately he is battling Mantle cell lymphoma. He's having weekly chemotherapy with Velcade and Cytoxan. From a cardiovascular standpoint he's not had any acute symptoms. He doesn't describe any chest pressure, neck or arm discomfort. He doesn't describe any palpitations, presyncope or syncope. Unfortunately with weakness and fatigue his very inactive. He does have chronic lower extremity swelling. However, he doesn't wear his compression stockings or take his Lasix. He says he already has urinary frequency with a big prostate.  Allergies  Allergen Reactions  . Atorvastatin     Current Outpatient Prescriptions  Medication Sig Dispense Refill  . acyclovir (ZOVIRAX) 400 MG tablet Take 1 tablet (400 mg total) by mouth 2 (two) times daily.  60 tablet  6  . amLODipine (NORVASC) 2.5 MG tablet Take 2.5 mg by mouth daily.        Marland Kitchen aspirin 81 MG tablet Take 81 mg by mouth every other day.      Marland Kitchen atenolol (TENORMIN) 50 MG tablet TAKE ONE TABLET BY MOUTH EVERY DAY  30 tablet  6  . cephALEXin (KEFLEX) 500 MG capsule Take 1 capsule (500 mg total) by mouth 2 (two) times daily.  14 capsule  0  . citalopram (CELEXA) 20 MG tablet TAKE ONE TABLET BY MOUTH EVERY DAY  30 tablet  3  . clopidogrel (PLAVIX) 75 MG tablet TAKE ONE TABLET BY MOUTH EVERY DAY  30 tablet  5  . furosemide (LASIX) 20 MG tablet Take 20 mg by mouth daily as needed. For fluid.      . isosorbide mononitrate (IMDUR) 60 MG 24 hr tablet TAKE ONE TABLET BY MOUTH EVERY DAY  30 tablet  5  . lidocaine-prilocaine (EMLA) cream Apply to port 1 hour before treatment. Cover site with plastic wrap.  30 g  11  . naproxen sodium (ANAPROX) 220 MG tablet Take 440 mg by mouth 2 (two) times daily as needed. For pain.      . nitroGLYCERIN (NITROSTAT) 0.4 MG SL tablet Place 1 tablet (0.4 mg total) under the tongue every 5 (five)  minutes as needed. For chest pain.  25 tablet  prn  . pravastatin (PRAVACHOL) 40 MG tablet TAKE TWO TABLETS BY MOUTH AT BEDTIME  60 tablet  5  . PRESCRIPTION MEDICATION Inject into the vein once a week. Velcade and Cytoxan.      . sulfamethoxazole-trimethoprim (BACTRIM DS) 800-160 MG per tablet TAKE ONE TABLET BY MOUTH EVERY DAY AS  DIRECTED.  TAKE  1  TABLET  ON  MONDAY,  WEDNESDAY  AND  FRIDAY  24 tablet  1   No current facility-administered medications for this visit.   Facility-Administered Medications Ordered in Other Visits  Medication Dose Route Frequency Provider Last Rate Last Dose  . sodium chloride 0.9 % injection 10 mL  10 mL Intracatheter PRN Samul Dada, MD   10 mL at 08/03/12 1419    Past Medical History  Diagnosis Date  . thymus ca dx'd 2003    Radiation comp 2003  . NHL (non-Hodgkin's lymphoma) dx'd 2008    Chemo comp 10/2010; rituxin comp 10/2010  . Hypertension   . CAD 11/12/2007     (Cath 7/11 Patent LIMA to the LAD. Previously known occlusion of a saphenous vein graft to diagonal and    sequential to obtuse marginals. Severe diffuse native obtuse marginal and diagonal branch vessel disease.  Preserved ejection fraction.)  . HYPERLIPIDEMIA 11/12/2007  . HYPERTENSION 11/12/2007  . PERIPHERAL VASCULAR DISEASE 11/12/2007  . ARTHRITIS 11/12/2007  . BENIGN PROSTATIC HYPERTROPHY, HX OF 11/12/2007  . 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 Edema 10/17/2010  . INGUINAL HERNIAS, BILATERAL 11/10/2006  . MANTLE CELL LYMPHOMA INTRA-ABDOMINAL LYMPH NODES 11/12/2007  . Obesity, unspecified 07/05/2009  . SLEEP APNEA 11/12/2007  . History of PTCA 2005  . Osteoarthritis   . Carotid stenosis, bilateral   . Nephrolithiasis     Past Surgical History  Procedure Date  . Coronary artery bypass graft   . Cardiac catheterization 08/2009, 02/2010    ROS:  As stated in the HPI and negative for all other systems.  PHYSICAL EXAM BP 140/85  Pulse 59  Ht 5\' 9"  (1.753 m)  Wt 206 lb 3.2 oz (93.532  kg)  BMI 30.45 kg/m2 GENERAL:  Well appearing HEENT:  Pupils equal round and reactive, fundi not visualized, oral mucosa unremarkable NECK:  No jugular venous distention, waveform within normal limits, carotid upstroke brisk and symmetric, no bruits, no thyromegaly LYMPHATICS:  No cervical, inguinal adenopathy LUNGS:  Clear to auscultation bilaterally BACK:  No CVA tenderness CHEST:  Well healed sternotomy scar. HEART:  PMI not displaced or sustained,S1 and S2 within normal limits, no S3, no S4, no clicks, no rubs, no murmurs ABD:  Flat, positive bowel sounds normal in frequency in pitch, no bruits, no rebound, no guarding, no midline pulsatile mass, no hepatomegaly, no splenomegaly EXT:  2 plus pulses throughout, moderate right greater than left lower ext edema, no cyanosis no clubbing SKIN:  No rashes no nodules NEURO:  Cranial nerves II through XII grossly intact, motor grossly intact throughout PSYCH:  Cognitively intact, oriented to person place and time  EKG:  Normal sinus rhythm, rate 59, axis within normal limits, intervals within normal limits, no acute ST-T wave changes. 09/21/2012  ASSESSMENT AND PLAN  CAD -  He does have diffuse native vessel disease as described. I reviewed his catheterization from 2011. At that time he needed medical management. He did have her dose of aspirin because of bruising. I would like to try to keep him on the Plavix but if he has continued problems with excessive bruising or bleeding this medication could be stopped.  HYPERTENSION -  The blood pressure is at target. No change in medications is indicated. We will continue with therapeutic lifestyle changes (TLC).   HYPERLIPIDEMIA -  Although he is not on target statin according to current guidelines given his ongoing medical problems probably him on pravastatin.  EDEMA - He is encouraged to at least occasionally take his Lasix and wear his compression stockings to treat his chronic edema.

## 2012-09-22 ENCOUNTER — Telehealth: Payer: Self-pay | Admitting: Oncology

## 2012-09-22 ENCOUNTER — Telehealth: Payer: Self-pay | Admitting: *Deleted

## 2012-09-22 NOTE — Telephone Encounter (Signed)
Talked to patient and he will come and get new calendar for February and March 2014

## 2012-09-22 NOTE — Telephone Encounter (Signed)
MD visit for 3/7 moved to earlier time. I have scheduled treatment appt. JMW

## 2012-09-23 ENCOUNTER — Ambulatory Visit: Payer: Medicare Other

## 2012-09-24 ENCOUNTER — Other Ambulatory Visit: Payer: Self-pay | Admitting: Oncology

## 2012-09-25 ENCOUNTER — Other Ambulatory Visit (HOSPITAL_BASED_OUTPATIENT_CLINIC_OR_DEPARTMENT_OTHER): Payer: Medicare Other | Admitting: Lab

## 2012-09-25 ENCOUNTER — Ambulatory Visit (HOSPITAL_BASED_OUTPATIENT_CLINIC_OR_DEPARTMENT_OTHER): Payer: Medicare Other

## 2012-09-25 VITALS — BP 165/71 | HR 57 | Temp 98.1°F | Resp 18

## 2012-09-25 DIAGNOSIS — C8313 Mantle cell lymphoma, intra-abdominal lymph nodes: Secondary | ICD-10-CM

## 2012-09-25 DIAGNOSIS — L089 Local infection of the skin and subcutaneous tissue, unspecified: Secondary | ICD-10-CM

## 2012-09-25 DIAGNOSIS — Z5112 Encounter for antineoplastic immunotherapy: Secondary | ICD-10-CM

## 2012-09-25 DIAGNOSIS — I739 Peripheral vascular disease, unspecified: Secondary | ICD-10-CM

## 2012-09-25 LAB — CBC WITH DIFFERENTIAL/PLATELET
Basophils Absolute: 0 10*3/uL (ref 0.0–0.1)
Eosinophils Absolute: 0.3 10*3/uL (ref 0.0–0.5)
HGB: 11.1 g/dL — ABNORMAL LOW (ref 13.0–17.1)
LYMPH%: 22.7 % (ref 14.0–49.0)
MCV: 103.1 fL — ABNORMAL HIGH (ref 79.3–98.0)
MONO%: 20 % — ABNORMAL HIGH (ref 0.0–14.0)
NEUT#: 2.1 10*3/uL (ref 1.5–6.5)
Platelets: 139 10*3/uL — ABNORMAL LOW (ref 140–400)
RDW: 13.2 % (ref 11.0–14.6)

## 2012-09-25 LAB — TECHNOLOGIST REVIEW

## 2012-09-25 MED ORDER — SODIUM CHLORIDE 0.9 % IV SOLN
Freq: Once | INTRAVENOUS | Status: AC
  Administered 2012-09-25: 12:00:00 via INTRAVENOUS

## 2012-09-25 MED ORDER — ONDANSETRON 8 MG/50ML IVPB (CHCC)
8.0000 mg | Freq: Once | INTRAVENOUS | Status: AC
  Administered 2012-09-25: 8 mg via INTRAVENOUS

## 2012-09-25 MED ORDER — SODIUM CHLORIDE 0.9 % IJ SOLN
10.0000 mL | INTRAMUSCULAR | Status: DC | PRN
  Administered 2012-09-25: 10 mL
  Filled 2012-09-25: qty 10

## 2012-09-25 MED ORDER — BORTEZOMIB CHEMO SQ INJECTION 3.5 MG (2.5MG/ML)
1.3000 mg/m2 | Freq: Once | INTRAMUSCULAR | Status: AC
  Administered 2012-09-25: 2.75 mg via SUBCUTANEOUS
  Filled 2012-09-25: qty 2.75

## 2012-09-25 MED ORDER — HEPARIN SOD (PORK) LOCK FLUSH 100 UNIT/ML IV SOLN
500.0000 [IU] | Freq: Once | INTRAVENOUS | Status: AC | PRN
  Administered 2012-09-25: 500 [IU]
  Filled 2012-09-25: qty 5

## 2012-09-25 MED ORDER — DEXAMETHASONE SODIUM PHOSPHATE 4 MG/ML IJ SOLN
20.0000 mg | Freq: Once | INTRAMUSCULAR | Status: AC
  Administered 2012-09-25: 20 mg via INTRAVENOUS

## 2012-09-25 MED ORDER — SODIUM CHLORIDE 0.9 % IV SOLN
190.0000 mg/m2 | Freq: Once | INTRAVENOUS | Status: AC
  Administered 2012-09-25: 400 mg via INTRAVENOUS
  Filled 2012-09-25: qty 20

## 2012-09-25 NOTE — Patient Instructions (Signed)
[facility] Discharge Instructions for Patients Receiving Chemotherapy  Today you received the following chemotherapy agents Cytoxan/Velcade To help prevent nausea and vomiting after your treatment, we encourage you to take your nausea medication as prescribed.   If you develop nausea and vomiting that is not controlled by your nausea medication, call the clinic. If it is after clinic hours your family physician or the after hours number for the clinic or go to the Emergency Department.   BELOW ARE SYMPTOMS THAT SHOULD BE REPORTED IMMEDIATELY:  *FEVER GREATER THAN 100.5 F  *CHILLS WITH OR WITHOUT FEVER  NAUSEA AND VOMITING THAT IS NOT CONTROLLED WITH YOUR NAUSEA MEDICATION  *UNUSUAL SHORTNESS OF BREATH  *UNUSUAL BRUISING OR BLEEDING  TENDERNESS IN MOUTH AND THROAT WITH OR WITHOUT PRESENCE OF ULCERS  *URINARY PROBLEMS  *BOWEL PROBLEMS  UNUSUAL RASH Items with * indicate a potential emergency and should be followed up as soon as possible.  One of the nurses will contact you 24 hours after your treatment. Please let the nurse know about any problems that you may have experienced. Feel free to call the clinic you have any questions or concerns. The clinic phone number is [phone].   I have been informed and understand all the instructions given to me. I know to contact the clinic, my physician, or go to the Emergency Department if any problems should occur. I do not have any questions at this time, but understand that I may call the clinic during office hours   should I have any questions or need assistance in obtaining follow up care.    __________________________________________  _____________  __________ Signature of Patient or Authorized Representative            Date                   Time    __________________________________________ Nurse's Signature    

## 2012-10-02 ENCOUNTER — Other Ambulatory Visit (HOSPITAL_BASED_OUTPATIENT_CLINIC_OR_DEPARTMENT_OTHER): Payer: Medicare Other | Admitting: Lab

## 2012-10-02 ENCOUNTER — Ambulatory Visit (HOSPITAL_BASED_OUTPATIENT_CLINIC_OR_DEPARTMENT_OTHER): Payer: Medicare Other

## 2012-10-02 VITALS — BP 133/70 | HR 58 | Temp 97.8°F | Resp 20

## 2012-10-02 DIAGNOSIS — C8313 Mantle cell lymphoma, intra-abdominal lymph nodes: Secondary | ICD-10-CM

## 2012-10-02 DIAGNOSIS — Z5111 Encounter for antineoplastic chemotherapy: Secondary | ICD-10-CM

## 2012-10-02 DIAGNOSIS — I739 Peripheral vascular disease, unspecified: Secondary | ICD-10-CM

## 2012-10-02 DIAGNOSIS — Z5112 Encounter for antineoplastic immunotherapy: Secondary | ICD-10-CM

## 2012-10-02 DIAGNOSIS — S80819A Abrasion, unspecified lower leg, initial encounter: Secondary | ICD-10-CM

## 2012-10-02 LAB — CBC WITH DIFFERENTIAL/PLATELET
BASO%: 0.3 % (ref 0.0–2.0)
LYMPH%: 22.8 % (ref 14.0–49.0)
MCHC: 33.1 g/dL (ref 32.0–36.0)
MONO#: 0.8 10*3/uL (ref 0.1–0.9)
MONO%: 22 % — ABNORMAL HIGH (ref 0.0–14.0)
NEUT#: 1.8 10*3/uL (ref 1.5–6.5)
Platelets: 123 10*3/uL — ABNORMAL LOW (ref 140–400)
RBC: 3.21 10*6/uL — ABNORMAL LOW (ref 4.20–5.82)
RDW: 13.4 % (ref 11.0–14.6)
WBC: 3.7 10*3/uL — ABNORMAL LOW (ref 4.0–10.3)
nRBC: 0 % (ref 0–0)

## 2012-10-02 MED ORDER — HEPARIN SOD (PORK) LOCK FLUSH 100 UNIT/ML IV SOLN
500.0000 [IU] | Freq: Once | INTRAVENOUS | Status: AC | PRN
  Administered 2012-10-02: 500 [IU]
  Filled 2012-10-02: qty 5

## 2012-10-02 MED ORDER — SODIUM CHLORIDE 0.9 % IV SOLN
Freq: Once | INTRAVENOUS | Status: AC
  Administered 2012-10-02: 20 mL via INTRAVENOUS

## 2012-10-02 MED ORDER — SODIUM CHLORIDE 0.9 % IV SOLN
190.0000 mg/m2 | Freq: Once | INTRAVENOUS | Status: AC
  Administered 2012-10-02: 400 mg via INTRAVENOUS
  Filled 2012-10-02: qty 20

## 2012-10-02 MED ORDER — ONDANSETRON 8 MG/50ML IVPB (CHCC)
8.0000 mg | Freq: Once | INTRAVENOUS | Status: AC
  Administered 2012-10-02: 8 mg via INTRAVENOUS

## 2012-10-02 MED ORDER — BORTEZOMIB CHEMO SQ INJECTION 3.5 MG (2.5MG/ML)
1.3000 mg/m2 | Freq: Once | INTRAMUSCULAR | Status: AC
  Administered 2012-10-02: 2.75 mg via SUBCUTANEOUS
  Filled 2012-10-02: qty 2.75

## 2012-10-02 MED ORDER — SODIUM CHLORIDE 0.9 % IJ SOLN
10.0000 mL | INTRAMUSCULAR | Status: DC | PRN
  Administered 2012-10-02: 10 mL
  Filled 2012-10-02: qty 10

## 2012-10-02 MED ORDER — DEXAMETHASONE SODIUM PHOSPHATE 4 MG/ML IJ SOLN
20.0000 mg | Freq: Once | INTRAMUSCULAR | Status: AC
  Administered 2012-10-02: 20 mg via INTRAVENOUS

## 2012-10-02 NOTE — Patient Instructions (Signed)
[facility] Discharge Instructions for Patients Receiving Chemotherapy  Today you received the following chemotherapy agents cytoxan ,velcade To help prevent nausea and vomiting after your treatment, we encourage you to take your nausea medication   Take it as often as prescribed.   If you develop nausea and vomiting that is not controlled by your nausea medication, call the clinic. If it is after clinic hours your family physician or the after hours number for the clinic or go to the Emergency Department.   BELOW ARE SYMPTOMS THAT SHOULD BE REPORTED IMMEDIATELY:  *FEVER GREATER THAN 100.5 F  *CHILLS WITH OR WITHOUT FEVER  NAUSEA AND VOMITING THAT IS NOT CONTROLLED WITH YOUR NAUSEA MEDICATION  *UNUSUAL SHORTNESS OF BREATH  *UNUSUAL BRUISING OR BLEEDING  TENDERNESS IN MOUTH AND THROAT WITH OR WITHOUT PRESENCE OF ULCERS  *URINARY PROBLEMS  *BOWEL PROBLEMS  UNUSUAL RASH Items with * indicate a potential emergency and should be followed up as soon as possible.  If this is your first treatment one of the nurses will contact you 24 hours after your treatment. Please let the nurse know about any problems that you may have experienced. Feel free to call the clinic you have any questions or concerns. The clinic phone number is [phone].   I have been informed and understand all the instructions given to me. I know to contact the clinic, my physician, or go to the Emergency Department if any problems should occur. I do not have any questions at this time, but understand that I may call the clinic during office hours   should I have any questions or need assistance in obtaining follow up care.    __________________________________________  _____________  __________ Signature of Patient or Authorized Representative            Date                   Time    __________________________________________ Nurse's Signature

## 2012-10-04 ENCOUNTER — Other Ambulatory Visit: Payer: Self-pay | Admitting: Oncology

## 2012-10-08 ENCOUNTER — Other Ambulatory Visit: Payer: Self-pay | Admitting: Oncology

## 2012-10-09 ENCOUNTER — Telehealth: Payer: Self-pay

## 2012-10-09 ENCOUNTER — Other Ambulatory Visit: Payer: Medicare Other | Admitting: Lab

## 2012-10-09 ENCOUNTER — Ambulatory Visit: Payer: Medicare Other

## 2012-10-09 ENCOUNTER — Other Ambulatory Visit: Payer: Self-pay | Admitting: Oncology

## 2012-10-09 ENCOUNTER — Telehealth: Payer: Self-pay | Admitting: *Deleted

## 2012-10-09 NOTE — Telephone Encounter (Signed)
S/w with wife that we can cancel this weeks chemo and will keep next weeks appt. We will move the rituxan to next week as well. Dr Arline Asp said this is OK

## 2012-10-09 NOTE — Telephone Encounter (Signed)
Patient called and left a message that they need to cancel appts for today. Desk RN notified.  I have called them back and told them we will call next week to reschedule.  JMW

## 2012-10-16 ENCOUNTER — Ambulatory Visit (HOSPITAL_BASED_OUTPATIENT_CLINIC_OR_DEPARTMENT_OTHER): Payer: Medicare Other

## 2012-10-16 ENCOUNTER — Encounter: Payer: Self-pay | Admitting: Oncology

## 2012-10-16 ENCOUNTER — Other Ambulatory Visit (HOSPITAL_BASED_OUTPATIENT_CLINIC_OR_DEPARTMENT_OTHER): Payer: Medicare Other | Admitting: Lab

## 2012-10-16 ENCOUNTER — Other Ambulatory Visit: Payer: Self-pay | Admitting: Oncology

## 2012-10-16 VITALS — BP 172/85 | HR 61 | Temp 97.5°F | Resp 18

## 2012-10-16 DIAGNOSIS — L089 Local infection of the skin and subcutaneous tissue, unspecified: Secondary | ICD-10-CM

## 2012-10-16 DIAGNOSIS — Z5111 Encounter for antineoplastic chemotherapy: Secondary | ICD-10-CM

## 2012-10-16 DIAGNOSIS — C8313 Mantle cell lymphoma, intra-abdominal lymph nodes: Secondary | ICD-10-CM

## 2012-10-16 DIAGNOSIS — I739 Peripheral vascular disease, unspecified: Secondary | ICD-10-CM

## 2012-10-16 DIAGNOSIS — Z5112 Encounter for antineoplastic immunotherapy: Secondary | ICD-10-CM

## 2012-10-16 LAB — CBC WITH DIFFERENTIAL/PLATELET
Basophils Absolute: 0 10*3/uL (ref 0.0–0.1)
Eosinophils Absolute: 0.2 10*3/uL (ref 0.0–0.5)
HGB: 12 g/dL — ABNORMAL LOW (ref 13.0–17.1)
LYMPH%: 34.9 % (ref 14.0–49.0)
MCV: 102.3 fL — ABNORMAL HIGH (ref 79.3–98.0)
MONO%: 23.9 % — ABNORMAL HIGH (ref 0.0–14.0)
NEUT#: 1.1 10*3/uL — ABNORMAL LOW (ref 1.5–6.5)
Platelets: 207 10*3/uL (ref 140–400)

## 2012-10-16 LAB — TECHNOLOGIST REVIEW

## 2012-10-16 MED ORDER — SODIUM CHLORIDE 0.9 % IJ SOLN
10.0000 mL | INTRAMUSCULAR | Status: DC | PRN
  Administered 2012-10-16: 10 mL
  Filled 2012-10-16: qty 10

## 2012-10-16 MED ORDER — SODIUM CHLORIDE 0.9 % IV SOLN
Freq: Once | INTRAVENOUS | Status: AC
  Administered 2012-10-16: 11:00:00 via INTRAVENOUS

## 2012-10-16 MED ORDER — DEXAMETHASONE SODIUM PHOSPHATE 4 MG/ML IJ SOLN
20.0000 mg | Freq: Once | INTRAMUSCULAR | Status: AC
  Administered 2012-10-16: 20 mg via INTRAVENOUS

## 2012-10-16 MED ORDER — BORTEZOMIB CHEMO SQ INJECTION 3.5 MG (2.5MG/ML)
1.3000 mg/m2 | Freq: Once | INTRAMUSCULAR | Status: AC
  Administered 2012-10-16: 2.75 mg via SUBCUTANEOUS
  Filled 2012-10-16: qty 2.75

## 2012-10-16 MED ORDER — HEPARIN SOD (PORK) LOCK FLUSH 100 UNIT/ML IV SOLN
500.0000 [IU] | Freq: Once | INTRAVENOUS | Status: AC | PRN
  Administered 2012-10-16: 500 [IU]
  Filled 2012-10-16: qty 5

## 2012-10-16 MED ORDER — ONDANSETRON 8 MG/50ML IVPB (CHCC)
8.0000 mg | Freq: Once | INTRAVENOUS | Status: AC
  Administered 2012-10-16: 8 mg via INTRAVENOUS

## 2012-10-16 MED ORDER — RITUXIMAB CHEMO INJECTION 10 MG/ML
375.0000 mg/m2 | Freq: Once | INTRAVENOUS | Status: AC
  Administered 2012-10-16: 800 mg via INTRAVENOUS
  Filled 2012-10-16: qty 80

## 2012-10-16 MED ORDER — ACETAMINOPHEN 325 MG PO TABS
650.0000 mg | ORAL_TABLET | Freq: Once | ORAL | Status: AC
  Administered 2012-10-16: 650 mg via ORAL

## 2012-10-16 MED ORDER — DIPHENHYDRAMINE HCL 25 MG PO CAPS
25.0000 mg | ORAL_CAPSULE | Freq: Once | ORAL | Status: AC
  Administered 2012-10-16: 25 mg via ORAL

## 2012-10-16 MED ORDER — SODIUM CHLORIDE 0.9 % IV SOLN
190.0000 mg/m2 | Freq: Once | INTRAVENOUS | Status: AC
  Administered 2012-10-16: 400 mg via INTRAVENOUS
  Filled 2012-10-16: qty 20

## 2012-10-16 NOTE — Progress Notes (Signed)
This patient did not receive chemotherapy last week because of the bad weather. Today his ANC is 1.1 and total white count 3.4.  We will go ahead with schedule chemotherapy today. The patient will return tomorrow for Neulasta 6 mg subcutaneous. He comes in every week for treatment.  The patient has an appointment to see me on 10/30/2012.

## 2012-10-16 NOTE — Patient Instructions (Signed)
[facility] Discharge Instructions for Patients Receiving Chemotherapy  Today you received the following chemotherapy agents velcade/cytoxan/rituxan  To help prevent nausea and vomiting after your treatment, we encourage you to take your nausea medication as directed   If you develop nausea and vomiting that is not controlled by your nausea medication, call the clinic. If it is after clinic hours your family physician or the after hours number for the clinic or go to the Emergency Department.   BELOW ARE SYMPTOMS THAT SHOULD BE REPORTED IMMEDIATELY:  *FEVER GREATER THAN 100.5 F  *CHILLS WITH OR WITHOUT FEVER  NAUSEA AND VOMITING THAT IS NOT CONTROLLED WITH YOUR NAUSEA MEDICATION  *UNUSUAL SHORTNESS OF BREATH  *UNUSUAL BRUISING OR BLEEDING  TENDERNESS IN MOUTH AND THROAT WITH OR WITHOUT PRESENCE OF ULCERS  *URINARY PROBLEMS  *BOWEL PROBLEMS  UNUSUAL RASH Items with * indicate a potential emergency and should be followed up as soon as possible.  One of the nurses will contact you 24 hours after your treatment. Please let the nurse know about any problems that you may have experienced. Feel free to call the clinic you have any questions or concerns. The clinic phone number is [phone].   I have been informed and understand all the instructions given to me. I know to contact the clinic, my physician, or go to the Emergency Department if any problems should occur. I do not have any questions at this time, but understand that I may call the clinic during office hours   should I have any questions or need assistance in obtaining follow up care.    __________________________________________  _____________  __________ Signature of Patient or Authorized Representative            Date                   Time    __________________________________________ Nurse's Signature

## 2012-10-16 NOTE — Progress Notes (Signed)
Labs reviewed with MD. ANC 1.1. Pt will receive treatment today  Despite ANC. Pt to return Saturday 10/17/12 for Neulasta. Pt aware.

## 2012-10-17 ENCOUNTER — Ambulatory Visit (HOSPITAL_BASED_OUTPATIENT_CLINIC_OR_DEPARTMENT_OTHER): Payer: Medicare Other

## 2012-10-17 ENCOUNTER — Other Ambulatory Visit: Payer: Self-pay | Admitting: Oncology

## 2012-10-17 VITALS — BP 154/77 | HR 56 | Temp 98.0°F | Resp 20

## 2012-10-17 DIAGNOSIS — C8313 Mantle cell lymphoma, intra-abdominal lymph nodes: Secondary | ICD-10-CM

## 2012-10-17 DIAGNOSIS — C37 Malignant neoplasm of thymus: Secondary | ICD-10-CM

## 2012-10-17 MED ORDER — PEGFILGRASTIM INJECTION 6 MG/0.6ML
6.0000 mg | Freq: Once | SUBCUTANEOUS | Status: AC
  Administered 2012-10-17: 6 mg via SUBCUTANEOUS

## 2012-10-19 ENCOUNTER — Other Ambulatory Visit: Payer: Self-pay | Admitting: Certified Registered Nurse Anesthetist

## 2012-10-19 ENCOUNTER — Encounter: Payer: Self-pay | Admitting: Oncology

## 2012-10-19 ENCOUNTER — Other Ambulatory Visit: Payer: Self-pay | Admitting: Oncology

## 2012-10-20 ENCOUNTER — Telehealth: Payer: Self-pay | Admitting: Medical Oncology

## 2012-10-20 NOTE — Telephone Encounter (Signed)
I called pt and left a message that Dr. Arline Asp has cancelled his chemo on 11/19/12. He will need to keep his lab appointment. He will then have labs,MD and chemo 10/30/12.

## 2012-10-22 ENCOUNTER — Telehealth: Payer: Self-pay | Admitting: Cardiology

## 2012-10-22 NOTE — Telephone Encounter (Signed)
New Prob     Pt has been experiencing chest pain, had to take a nitro 2 nights ago. Would like to be seen today.

## 2012-10-22 NOTE — Telephone Encounter (Signed)
Spoke with wife who was very upset that her call has not returned as promptly as she would have liked.  Explained to her the message had not been marked as urgent and calls were being returned as received based on time.  Pt is NOT having active chest pain.  He had some chest pain last night for which he took 2 SL ntg with relief.  She reports he c/o SOB with walking steps and doesn't understand why he is having that.  I requested to speak with the pt to further evaluate his pain/SOB and needs and was told he was not available for me to speak with.  Instructed wife that I could schedule an appointment for him to be seen which she was not happy with.  She was not pleased that Dr Antoine Poche was not in the office.  Explained he is working in another office and will be back to this office on Monday and in [place] office where pt is normally seen on Wednesday.  She then stated "so what is he supposed to do, just sit here and have a heart attack?"    Explained again to wife that if pt is having active chest pain he should report to the ED for evaluation.  Also instructed her to have him use his SL ntg as directed if CP should re-occur.  I offered appts for both Monday in the [place] office and Wednesday in the [place] office (since he wants to see his doctor).  appt was scheduled for Wednesday in [place] (double booked as the schedule was already full).  Pt should call back if further problems prior to then or call 911 and be transferred to ED for evaluation if CP is not resolved with SL Ntg.

## 2012-10-23 ENCOUNTER — Other Ambulatory Visit (HOSPITAL_BASED_OUTPATIENT_CLINIC_OR_DEPARTMENT_OTHER): Payer: Medicare Other | Admitting: Lab

## 2012-10-23 ENCOUNTER — Ambulatory Visit: Payer: Medicare Other

## 2012-10-23 DIAGNOSIS — I739 Peripheral vascular disease, unspecified: Secondary | ICD-10-CM

## 2012-10-23 DIAGNOSIS — L089 Local infection of the skin and subcutaneous tissue, unspecified: Secondary | ICD-10-CM

## 2012-10-23 DIAGNOSIS — C8313 Mantle cell lymphoma, intra-abdominal lymph nodes: Secondary | ICD-10-CM

## 2012-10-23 LAB — CBC WITH DIFFERENTIAL/PLATELET
Basophils Absolute: 0.2 10*3/uL — ABNORMAL HIGH (ref 0.0–0.1)
Eosinophils Absolute: 0.3 10*3/uL (ref 0.0–0.5)
HCT: 36.1 % — ABNORMAL LOW (ref 38.4–49.9)
HGB: 12 g/dL — ABNORMAL LOW (ref 13.0–17.1)
LYMPH%: 6.9 % — ABNORMAL LOW (ref 14.0–49.0)
MCV: 102.8 fL — ABNORMAL HIGH (ref 79.3–98.0)
MONO%: 17.8 % — ABNORMAL HIGH (ref 0.0–14.0)
NEUT#: 17.4 10*3/uL — ABNORMAL HIGH (ref 1.5–6.5)
Platelets: 91 10*3/uL — ABNORMAL LOW (ref 140–400)
RDW: 14.2 % (ref 11.0–14.6)

## 2012-10-28 ENCOUNTER — Encounter: Payer: Self-pay | Admitting: Cardiology

## 2012-10-28 ENCOUNTER — Ambulatory Visit (INDEPENDENT_AMBULATORY_CARE_PROVIDER_SITE_OTHER): Payer: Medicare Other | Admitting: Cardiology

## 2012-10-28 ENCOUNTER — Other Ambulatory Visit: Payer: Self-pay | Admitting: Cardiology

## 2012-10-28 VITALS — BP 138/75 | HR 68 | Ht 67.0 in | Wt 209.0 lb

## 2012-10-28 DIAGNOSIS — I2510 Atherosclerotic heart disease of native coronary artery without angina pectoris: Secondary | ICD-10-CM

## 2012-10-28 MED ORDER — ISOSORBIDE MONONITRATE ER 120 MG PO TB24: ORAL_TABLET | ORAL | Status: DC

## 2012-10-28 NOTE — Progress Notes (Signed)
The patient presents for followup of his known coronary disease. He is being treated for mantle cell lymphoma. He's having weekly chemotherapy with Velcade and Cytoxan. His wife called the other day because he had chest discomfort. He required one sublingual nitroglycerin. She also says he's been more short of breath. However, he says he's only mildly short of breath with activities. He's not describing PND or orthopnea. He's had no further chest pressure, neck or arm discomfort. He's not required any more sublingual nitroglycerin.  ThisHe doesn't describe any palpitations, presyncope or syncope. Unfortunately with weakness and fatigue his very inactive. He does have chronic lower extremity swelling as mentioned in the previous note and this is unchanged.   Allergies  Allergen Reactions  . Atorvastatin     Current Outpatient Prescriptions  Medication Sig Dispense Refill  . acyclovir (ZOVIRAX) 400 MG tablet TAKE ONE TABLET BY MOUTH TWICE DAILY  60 tablet  1  . amLODipine (NORVASC) 2.5 MG tablet Take 2.5 mg by mouth daily.        Marland Kitchen aspirin 81 MG tablet Take 81 mg by mouth every other day.      Marland Kitchen atenolol (TENORMIN) 50 MG tablet TAKE ONE TABLET BY MOUTH EVERY DAY  30 tablet  6  . cephALEXin (KEFLEX) 500 MG capsule Take 1 capsule (500 mg total) by mouth 2 (two) times daily.  14 capsule  0  . citalopram (CELEXA) 20 MG tablet TAKE ONE TABLET BY MOUTH EVERY DAY  30 tablet  1  . clopidogrel (PLAVIX) 75 MG tablet TAKE ONE TABLET BY MOUTH EVERY DAY  30 tablet  5  . furosemide (LASIX) 20 MG tablet Take 20 mg by mouth daily as needed. For fluid.      . isosorbide mononitrate (IMDUR) 60 MG 24 hr tablet TAKE ONE TABLET BY MOUTH EVERY DAY  30 tablet  5  . lidocaine-prilocaine (EMLA) cream Apply to port 1 hour before treatment. Cover site with plastic wrap.  30 g  11  . naproxen sodium (ANAPROX) 220 MG tablet Take 440 mg by mouth 2 (two) times daily as needed. For pain.      . nitroGLYCERIN (NITROSTAT) 0.4  MG SL tablet Place 1 tablet (0.4 mg total) under the tongue every 5 (five) minutes as needed. For chest pain.  25 tablet  prn  . pravastatin (PRAVACHOL) 40 MG tablet TAKE TWO TABLETS BY MOUTH AT BEDTIME  60 tablet  5  . PRESCRIPTION MEDICATION Inject into the vein once a week. Velcade and Cytoxan.      . sulfamethoxazole-trimethoprim (BACTRIM DS) 800-160 MG per tablet TAKE ONE TABLET BY MOUTH EVERY DAY AS DIRECTED. TAKE ONE TABLET ON MONDAY, WEDNESDAY, AND FRIDAY.  24 tablet  0   No current facility-administered medications for this visit.   Facility-Administered Medications Ordered in Other Visits  Medication Dose Route Frequency Journii Nierman Last Rate Last Dose  . sodium chloride 0.9 % injection 10 mL  10 mL Intracatheter PRN Samul Dada, MD   10 mL at 08/03/12 1419    Past Medical History  Diagnosis Date  . thymus ca dx'd 2003    Radiation comp 2003  . NHL (non-Hodgkin's lymphoma) dx'd 2008    Chemo comp 10/2010; rituxin comp 10/2010  . Hypertension   . CAD 11/12/2007     (Cath 7/11 Patent LIMA to the LAD. Previously known occlusion of a saphenous vein graft to diagonal and    sequential to obtuse marginals. Severe diffuse native obtuse marginal and  diagonal branch vessel disease.    Preserved ejection fraction.)  . HYPERLIPIDEMIA 11/12/2007  . HYPERTENSION 11/12/2007  . PERIPHERAL VASCULAR DISEASE 11/12/2007  . ARTHRITIS 11/12/2007  . BENIGN PROSTATIC HYPERTROPHY, HX OF 11/12/2007  . 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 Edema 10/17/2010  . INGUINAL HERNIAS, BILATERAL 11/10/2006  . MANTLE CELL LYMPHOMA INTRA-ABDOMINAL LYMPH NODES 11/12/2007  . Obesity, unspecified 07/05/2009  . SLEEP APNEA 11/12/2007  . History of PTCA 2005  . Osteoarthritis   . Carotid stenosis, bilateral   . Nephrolithiasis     Past Surgical History  Procedure Laterality Date  . Coronary artery bypass graft    . Cardiac catheterization  08/2009, 02/2010    ROS:  As stated in the HPI and negative for all other  systems.  PHYSICAL EXAM BP 138/75  Pulse 68  Ht 5\' 7"  (1.702 m)  Wt 209 lb (94.802 kg)  BMI 32.73 kg/m2 GENERAL:  Well appearing HEENT:  Pupils equal round and reactive, fundi not visualized, oral mucosa unremarkable NECK:  No jugular venous distention, waveform within normal limits, carotid upstroke brisk and symmetric, no bruits, no thyromegaly LYMPHATICS:  No cervical, inguinal adenopathy LUNGS:  Clear to auscultation bilaterally BACK:  No CVA tenderness CHEST:  Well healed sternotomy scar. HEART:  PMI not displaced or sustained,S1 and S2 within normal limits, no S3, no S4, no clicks, no rubs, no murmurs ABD:  Flat, positive bowel sounds normal in frequency in pitch, no bruits, no rebound, no guarding, no midline pulsatile mass, no hepatomegaly, no splenomegaly EXT:  2 plus pulses throughout, moderate right greater than left lower ext edema, no cyanosis no clubbing SKIN:  No rashes no nodules NEURO:  Cranial nerves II through XII grossly intact, motor grossly intact throughout PSYCH:  Cognitively intact, oriented to person place and time  EKG:  Normal sinus rhythm, rate 59, axis within normal limits, intervals within normal limits, no acute ST-T wave changes. 10/28/2012  ASSESSMENT AND PLAN  CAD -  He does have diffuse native vessel disease as described. I reviewed his catheterization from 2011 again. He's had only one episode of chest discomfort. He does have diffuse vessel coronary disease and I would prefer to try to manage this medically. I will increase his Imdur to 120 mg daily. He will let me know if he has increasing symptoms in the future.  HYPERTENSION -  The blood pressure is at target. No change in medications is indicated. We will continue with therapeutic lifestyle changes (TLC).   HYPERLIPIDEMIA -  Although he is not on target statin according to current guidelines given his ongoing medical problems I will leave him on pravastatin.  EDEMA - He is encouraged to at  least occasionally take his Lasix and wear his compression stockings to treat his chronic edema.  Given his dyspnea I will check a BNP.

## 2012-10-28 NOTE — Patient Instructions (Addendum)
Please increase your Isosorbide to 120 mg a day Continue all other medications as listed.  Please have blood work at [facility].  Follow up with Dr Antoine Poche in 6 months unless you have continued problems.

## 2012-10-29 LAB — BRAIN NATRIURETIC PEPTIDE: Brain Natriuretic Peptide: 182.6 pg/mL — ABNORMAL HIGH (ref 0.0–100.0)

## 2012-10-30 ENCOUNTER — Other Ambulatory Visit: Payer: Medicare Other | Admitting: Lab

## 2012-10-30 ENCOUNTER — Telehealth: Payer: Self-pay | Admitting: Oncology

## 2012-10-30 ENCOUNTER — Other Ambulatory Visit: Payer: Self-pay | Admitting: Medical Oncology

## 2012-10-30 ENCOUNTER — Ambulatory Visit: Payer: Medicare Other | Admitting: Oncology

## 2012-10-30 ENCOUNTER — Ambulatory Visit: Payer: Medicare Other

## 2012-10-30 ENCOUNTER — Telehealth: Payer: Self-pay | Admitting: *Deleted

## 2012-10-30 NOTE — Telephone Encounter (Signed)
S/w the pt and he is aware of his r/s appts from 10/30/2012 to 11/04/2012@10 :30am.

## 2012-10-30 NOTE — Telephone Encounter (Signed)
Per staff phone call and POF I have schedueld appts.  JMW  

## 2012-10-30 NOTE — Telephone Encounter (Signed)
Patient's wife called and left message that they need to cancel appts for today. They want to reschedule, message give to desk RN.  JMW

## 2012-11-04 ENCOUNTER — Ambulatory Visit (HOSPITAL_BASED_OUTPATIENT_CLINIC_OR_DEPARTMENT_OTHER): Payer: Medicare Other | Admitting: Oncology

## 2012-11-04 ENCOUNTER — Other Ambulatory Visit (HOSPITAL_BASED_OUTPATIENT_CLINIC_OR_DEPARTMENT_OTHER): Payer: Medicare Other | Admitting: Lab

## 2012-11-04 ENCOUNTER — Encounter: Payer: Self-pay | Admitting: Oncology

## 2012-11-04 ENCOUNTER — Telehealth: Payer: Self-pay | Admitting: Oncology

## 2012-11-04 ENCOUNTER — Other Ambulatory Visit: Payer: Self-pay | Admitting: Medical Oncology

## 2012-11-04 ENCOUNTER — Ambulatory Visit (HOSPITAL_BASED_OUTPATIENT_CLINIC_OR_DEPARTMENT_OTHER): Payer: Medicare Other

## 2012-11-04 VITALS — BP 163/89 | HR 68 | Temp 97.6°F | Resp 18

## 2012-11-04 VITALS — BP 176/87 | HR 71 | Temp 97.9°F | Resp 20 | Ht 67.0 in | Wt 209.3 lb

## 2012-11-04 DIAGNOSIS — C8313 Mantle cell lymphoma, intra-abdominal lymph nodes: Secondary | ICD-10-CM

## 2012-11-04 DIAGNOSIS — Z5112 Encounter for antineoplastic immunotherapy: Secondary | ICD-10-CM

## 2012-11-04 DIAGNOSIS — Z5111 Encounter for antineoplastic chemotherapy: Secondary | ICD-10-CM

## 2012-11-04 LAB — CBC WITH DIFFERENTIAL/PLATELET
BASO%: 1 % (ref 0.0–2.0)
Eosinophils Absolute: 0.2 10*3/uL (ref 0.0–0.5)
LYMPH%: 19.8 % (ref 14.0–49.0)
MCHC: 33.4 g/dL (ref 32.0–36.0)
MCV: 101.6 fL — ABNORMAL HIGH (ref 79.3–98.0)
MONO#: 0.6 10*3/uL (ref 0.1–0.9)
MONO%: 9.9 % (ref 0.0–14.0)
NEUT#: 4.1 10*3/uL (ref 1.5–6.5)
RBC: 3.71 10*6/uL — ABNORMAL LOW (ref 4.20–5.82)
RDW: 13.9 % (ref 11.0–14.6)
WBC: 6.2 10*3/uL (ref 4.0–10.3)
nRBC: 0 % (ref 0–0)

## 2012-11-04 LAB — COMPREHENSIVE METABOLIC PANEL (CC13)
Albumin: 3.5 g/dL (ref 3.5–5.0)
BUN: 13 mg/dL (ref 7.0–26.0)
CO2: 27 mEq/L (ref 22–29)
Calcium: 9 mg/dL (ref 8.4–10.4)
Chloride: 107 mEq/L (ref 98–107)
Glucose: 99 mg/dl (ref 70–99)
Potassium: 3.9 mEq/L (ref 3.5–5.1)
Sodium: 141 mEq/L (ref 136–145)
Total Protein: 6 g/dL — ABNORMAL LOW (ref 6.4–8.3)

## 2012-11-04 MED ORDER — SODIUM CHLORIDE 0.9 % IJ SOLN
10.0000 mL | INTRAMUSCULAR | Status: DC | PRN
  Filled 2012-11-04: qty 10

## 2012-11-04 MED ORDER — DEXAMETHASONE SODIUM PHOSPHATE 4 MG/ML IJ SOLN
20.0000 mg | Freq: Once | INTRAMUSCULAR | Status: AC
  Administered 2012-11-04: 20 mg via INTRAVENOUS

## 2012-11-04 MED ORDER — CYCLOPHOSPHAMIDE CHEMO INJECTION 1 GM
190.0000 mg/m2 | Freq: Once | INTRAMUSCULAR | Status: AC
  Administered 2012-11-04: 400 mg via INTRAVENOUS
  Filled 2012-11-04: qty 20

## 2012-11-04 MED ORDER — ACETAMINOPHEN 325 MG PO TABS
650.0000 mg | ORAL_TABLET | Freq: Once | ORAL | Status: AC
  Administered 2012-11-04: 650 mg via ORAL

## 2012-11-04 MED ORDER — DIPHENHYDRAMINE HCL 25 MG PO CAPS
25.0000 mg | ORAL_CAPSULE | Freq: Once | ORAL | Status: AC
  Administered 2012-11-04: 25 mg via ORAL

## 2012-11-04 MED ORDER — ONDANSETRON 8 MG/50ML IVPB (CHCC)
8.0000 mg | Freq: Once | INTRAVENOUS | Status: AC
  Administered 2012-11-04: 8 mg via INTRAVENOUS

## 2012-11-04 MED ORDER — SODIUM CHLORIDE 0.9 % IV SOLN
375.0000 mg/m2 | Freq: Once | INTRAVENOUS | Status: AC
  Administered 2012-11-04: 800 mg via INTRAVENOUS
  Filled 2012-11-04: qty 80

## 2012-11-04 MED ORDER — SODIUM CHLORIDE 0.9 % IV SOLN
Freq: Once | INTRAVENOUS | Status: AC
  Administered 2012-11-04: 14:00:00 via INTRAVENOUS

## 2012-11-04 MED ORDER — BORTEZOMIB CHEMO SQ INJECTION 3.5 MG (2.5MG/ML)
1.3000 mg/m2 | Freq: Once | INTRAMUSCULAR | Status: AC
  Administered 2012-11-04: 2.75 mg via SUBCUTANEOUS
  Filled 2012-11-04: qty 2.75

## 2012-11-04 MED ORDER — HEPARIN SOD (PORK) LOCK FLUSH 100 UNIT/ML IV SOLN
500.0000 [IU] | Freq: Once | INTRAVENOUS | Status: DC | PRN
  Filled 2012-11-04: qty 5

## 2012-11-04 NOTE — Patient Instructions (Addendum)
[facility] Discharge Instructions for Patients Receiving Chemotherapy  Today you received the following chemotherapy agents Velcade/Cytoxan and Rituxan To help prevent nausea and vomiting after your treatment, we encourage you to take your nausea medication as per Dr. Arline Asp  If you develop nausea and vomiting that is not controlled by your nausea medication, call the clinic. If it is after clinic hours your family physician or the after hours number for the clinic or go to the Emergency Department.   BELOW ARE SYMPTOMS THAT SHOULD BE REPORTED IMMEDIATELY:  *FEVER GREATER THAN 100.5 F  *CHILLS WITH OR WITHOUT FEVER  NAUSEA AND VOMITING THAT IS NOT CONTROLLED WITH YOUR NAUSEA MEDICATION  *UNUSUAL SHORTNESS OF BREATH  *UNUSUAL BRUISING OR BLEEDING  TENDERNESS IN MOUTH AND THROAT WITH OR WITHOUT PRESENCE OF ULCERS  *URINARY PROBLEMS  *BOWEL PROBLEMS  UNUSUAL RASH Items with * indicate a potential emergency and should be followed up as soon as possible.  . Feel free to call the clinic you have any questions or concerns. The clinic phone number is [phone].   I have been informed and understand all the instructions given to me. I know to contact the clinic, my physician, or go to the Emergency Department if any problems should occur. I do not have any questions at this time, but understand that I may call the clinic during office hours   should I have any questions or need assistance in obtaining follow up care.    __________________________________________  _____________  __________ Signature of Patient or Authorized Representative            Date                   Time    __________________________________________ Nurse's Signature

## 2012-11-04 NOTE — Progress Notes (Signed)
Pt. Scheduled for 2 hrs infusion only today. Pt. Is scheduled for Chemo and rituxan. Both wife and pt. Aware that he will need to be here longer than 2 hrs today. HL

## 2012-11-04 NOTE — Telephone Encounter (Signed)
GV AND PRINTED PT APPT SCHEDULE FOR mARCH AND APRIL....PRINTED TX PAPER FOR MELISSA...PT AWARE...[facility] PT BARIUM

## 2012-11-04 NOTE — Progress Notes (Signed)
This office note has been dictated.  #[id number]

## 2012-11-04 NOTE — Progress Notes (Signed)
CC:   [name], M.D.  PROBLEM LIST:  1. Mantle cell lymphoma diagnosed in March 2008 with positive bone  marrow initially on 12/03/2006 and then negative bone marrow after  treatment in October 2009. As stated, the patient presented with  obstructive liver abnormalities requiring ERCP and non metal stent  placement by Dr. Melvia Heaps. The stent was subsequently removed  in October 2008. The patient was treated with 8 cycles of Rituxan,  Cytoxan, vincristine and Decadron in combination with Neulasta from  12/19/2006 through 05/15/2007. The patient then received  maintenance Rituxan from July 16, 2007 through February 01, 2010.  While on Rituxan, he had a recurrence of his disease by CT scan  carried out on 01/24/2010. Martin Morris then received treatment  with bendamustine and Rituxan in combination with Neulasta for 6  cycles from 02/19/2010 through 07/26/2010. We have a prior PET scan from  08/22/2010 and CT scans of chest, abdomen, and pelvis from 01/01/2011 that showed no evidence of disease. He has been off treatment since December 2011 and had been doing well without any symptoms until the CT scan of the abdomen and pelvis on 07/01/2011 showed signs of recurrence. CT scans of abdomen and pelvis with IVC on 09/02/11 show further progression.  Rituxan, subcutaneous Velcade, intravenous Cytoxan and Decadron  were initiated on 10/15/2011. CT scans of the abdomen and pelvis carried out on 01/08/2012 showed a near complete response to therapy. CT scan of the abdomen and pelvis with IV contrast carried out on 06/19/2012 showed stable plaque-like soft tissue density in the upper abdominal retroperitoneum compared with the CT scan of 01/08/2012.  2. Coronary artery disease, S/P 2 MIs, most recently 03/21/10. Heart cath on 03/22/10. LVEF is 40-50%.  3. Peripheral vascular disease.  4. Dyslipidemia.  5. Hypertension.  6. History of depression.  7. History of kidney stones.  8. History of  sleep apnea.  9. History of colonic polyps.  10. 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 Kathlee Nations Trigt, status post radiation treatments.  11. Easy bruising.  12 Diverticulosis seen on CT scans done 06/19/2012.  13. Right Port-A-Cath placed on 11/20/2011.    MEDICATIONS:  Reviewed and recorded. Current Outpatient Prescriptions  Medication Sig Dispense Refill  . acyclovir (ZOVIRAX) 400 MG tablet TAKE ONE TABLET BY MOUTH TWICE DAILY  60 tablet  1  . amLODipine (NORVASC) 2.5 MG tablet Take 2.5 mg by mouth daily.        Marland Kitchen aspirin 81 MG tablet Take 81 mg by mouth every other day.      Marland Kitchen atenolol (TENORMIN) 50 MG tablet TAKE ONE TABLET BY MOUTH EVERY DAY  30 tablet  6  . cephALEXin (KEFLEX) 500 MG capsule Take 1 capsule (500 mg total) by mouth 2 (two) times daily.  14 capsule  0  . citalopram (CELEXA) 20 MG tablet TAKE ONE TABLET BY MOUTH EVERY DAY  30 tablet  1  . clopidogrel (PLAVIX) 75 MG tablet TAKE ONE TABLET BY MOUTH EVERY DAY  30 tablet  5  . furosemide (LASIX) 20 MG tablet Take 20 mg by mouth daily as needed. For fluid.      . isosorbide mononitrate (IMDUR) 120 MG 24 hr tablet TAKE ONE TABLET BY MOUTH EVERY DAY  30 tablet  11  . lidocaine-prilocaine (EMLA) cream Apply to port 1 hour before treatment. Cover site with plastic wrap.  30 g  11  . naproxen sodium (ANAPROX) 220  MG tablet Take 440 mg by mouth 2 (two) times daily as needed. For pain.      . nitroGLYCERIN (NITROSTAT) 0.4 MG SL tablet Place 1 tablet (0.4 mg total) under the tongue every 5 (five) minutes as needed. For chest pain.  25 tablet  prn  . pravastatin (PRAVACHOL) 40 MG tablet TAKE TWO TABLETS BY MOUTH AT BEDTIME  60 tablet  5  . PRESCRIPTION MEDICATION Inject into the vein once a week. Velcade and Cytoxan.      . sulfamethoxazole-trimethoprim (BACTRIM DS) 800-160 MG per tablet TAKE ONE TABLET BY MOUTH EVERY DAY AS DIRECTED. TAKE ONE TABLET ON MONDAY, WEDNESDAY, AND FRIDAY.  24 tablet   0   No current facility-administered medications for this visit.   Facility-Administered Medications Ordered in Other Visits  Medication Dose Route Frequency Rufina Kimery Last Rate Last Dose  . sodium chloride 0.9 % injection 10 mL  10 mL Intracatheter PRN Samul Dada, MD   10 mL at 08/03/12 1419    TREATMENT PROGRAM:  The patient is currently on subcutaneous Velcade, intravenous Cytoxan, and intravenous Decadron on a weekly basis.  He receives Rituxan IV every 3 weeks.  Treatments were started on 10/15/2011.  IMMUNIZATIONS:  Pneumovax was administered 09/06/2011.  Flu shot was administered in early December 2012 and on 05/13/2012.    SMOKING HISTORY: The patient has never smoked cigarettes.   HISTORY:  I saw Martin Morris today for followup of his relapsed mantle cell lymphoma with original diagnosis going back to March 2008. Martin Morris was accompanied by his wife, [name].  He was last seen by [place] on 09/18/2012.  Martin Morris has continued with the treatment program as outlined above.  There have been some disruptions because of inclement weather.  Martin Morris received chemotherapy and Rituxan on 09/18/2012. He received chemotherapy on 09/25/2012 and also on 10/02/2012.  We had a snowstorm that prevented treatment from occurring on February 14th. When the patient came back a week later on February 21st his white count was 3.4 and ANC was 1.1.  Platelet count was 207,000, hemoglobin 12.0. We went ahead with scheduled chemotherapy which included Rituxan, but the patient came back the next day to receive Neulasta 6 mg subcu because of the ANC of 1.1.  On February 28th chemotherapy was held primarily because of the Neulasta that the patient had received 1 week prior to February 28th,  In addition, the platelet count was 91,000. The patient was scheduled for treatment on Friday, March 7th, but once again we had icy, snowy weather and he was not able to make that treatment since he  lives in [place].  He is here today for chemotherapy and Rituxan.  Fortunately, there have been no major changes in Martin Morris' condition. No symptoms to suggest recurrence of his lymphoma.  He has been having a little bit more anginal-type chest pain and using nitroglycerin.  He saw Dr. Antoine Poche within the past week or so and some adjustments in his medicines, specifically the Imdur, were made.  The patient is without complaints today.  As stated, he is here for treatment.  PHYSICAL EXAMINATION:  General:  There are no obvious changes.  Weight is 209 pounds 4.8 ounces, height 5 feet 7 inches, body surface area 2.12 sq m.  Vital Signs:  Blood pressure today 176/87.  Other vital signs are normal.  HEENT: There is no scleral icterus. Mouth and pharynx are benign. There is no peripheral adenopathy palpable. Chest: The heart and lungs are  normal. There is a right-sided Port-A-Cath. Abdomen: Basically obese, but  benign. I do not feel any induration in the area above the umbilicus,  where we have felt some induration previously. A diastasis recti is present. Extremities: Continue to show 3+ edema of both lower legs. Neurologic: Exam is normal.   LABORATORY DATA:  White count 6.2, ANC 4.1, hemoglobin 12.6, hematocrit 37.7, platelets 210,000.  Chemistries today are pending.  Chemistries from 09/18/2012 normal, except for a glucose of 153 and an albumin of 3.3.  LDH was 303, which is stable.  A BNP from 10/28/2012 was 182.6 with normal being 0.0-100.0.  IMAGING STUDIES:  1. CT scan of chest, abdomen and pelvis with IV contrast on 01/01/2011  were negative for evidence of recurrent lymphoma.  2. PET scan from 08/22/2010 showed no evidence for recurrent lymphoma.  3. CT scan of chest, abdomen and pelvis with IV contrast on 01/01/2011  showed no evidence for lymphoma.  4. CT scan of abdomen and pelvis on 07/01/2011 showed interval  development of peritoneal disease worrisome for recurrence of   lymphoma. There was new right external iliac lymph node  pathologically enlarged and worrisome for recurrent lymphoma.  There was some stable appearance of mild soft tissue stranding  surrounding the celiac trunk and superior mesenteric artery.  5. Chest x-ray, 2 view, from 09/02/2011 showed no acute findings.  6. CT scan of abdomen and pelvis with IV contrast on 09/02/2011 showed  interval progression of lymphadenopathy in the abdomen and pelvis.  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 The 1.1 x 1.7 cm omental nodule  which was measured on the prior study of 07/01/2011 now measures  4.1 x 2.4 cm. A 2nd discrete soft tissue nodule in the omentum  which previously measured 1.1 x 1.5 cm now measures 2.2 x 3.0 cm.  7. CT scan of abdomen and pelvis with IV contrast on 01/08/2012 showed no residual measurable mass along the anterior aspect of the right diaphragm at the  site of the previously demonstrated 4.9 x 2.1 cm nodule. There is no  residual measurable gastrohepatic or celiac lymphadenopathy. There is  some soft tissue stranding around the proximal abdominal aorta, celiac  trunk and superior mesenteric artery as noted on images 27-34. Pelvic  lymphadenopathy is nearly completely resolved. A right external iliac  node measuring 6 mm on image 66 previously measured 9 mm. Another node  which previously measured 17 mm short axis now measures 8 mm on image  70. There is no residual inguinal adenopathy. The spleen remains  normal in size, demonstrates no focal abnormalities. There are no  suspect osseous findings. There is no residual mass at the left  posterior costophrenic angle. The final impression was near complete  response to therapy in the lymphadenopathy and omental disease with some  minimal residual measurable disease as previously noted. There was an  enlarged left inguinal hernia containing a knuckle of sigmoid colon with  no evidence for incarceration or bowel obstruction. There was a   nonobstructing left renal calculus.  8. CT scan of abdomen and pelvis with IV contrast carried out on 06/19/2012  was compared with the scan of 01/08/2012 and showed stable plaque-like soft tissue density in the upper abdominal retroperitoneum, which had the appearance of treated lymphoma. No new or progressive disease within the abdomen or pelvis  was noted. There is a stable incidental finding of a small left  inguinal hernia and diverticulosis.  9. Chest x-ray, 2-view, from 09/07/2012 showed low lung volumes  with  streaky basilar atelectasis.   IMPRESSION AND PLAN:  Martin Morris continues to do well.  We have had some disruptions in his treatment program as noted above.  On one occasion, specifically February 21st, the patient had an ANC of 1.1. His prior chemotherapy had been on 10/02/2012.  We did give him some Neulasta 6 mg subcu on 10/17/2012.  That was, in fact, the patient's Most recent chemotherapy.  He is here today for his complete program, specifically subcutaneous Velcade 2.75 mg, Cytoxan 400 mg IV, Decadron 20 mg IV, and Rituxan 800 mg IV.  We are continuing to treat the patient on a weekly basis, specifically March 19th, March 26, and April 2nd.  He will receive Rituxan today and on April 2nd.  We will make adjustments depending on blood counts which will precede every treatment.  We are planning to repeat the CT scan of the abdomen and pelvis with IV contrast on or about April 7th and compare it to the prior CT scan of 06/19/2012.  We will be seeing the patient again on April 10th.  He is scheduled for chemotherapy at that time, as well as labs.  If his CT scan shows no evidence of disease, then I am thinking of spacing out the chemotherapy treatments to every 2 weeks and the Rituxan treatments to every 4 weeks.  In looking over the patient's chart, I noted that we have never addressed the issue of living will and code status.  We did give him some information about a  living will today and hopefully we will discuss this in the future.    ______________________________ Samul Dada, M.D. DSM/MEDQ  D:  11/04/2012  T:  11/04/2012  Job:  [id number]

## 2012-11-06 ENCOUNTER — Other Ambulatory Visit: Payer: Self-pay | Admitting: Certified Registered Nurse Anesthetist

## 2012-11-06 ENCOUNTER — Other Ambulatory Visit: Payer: Medicare Other | Admitting: Lab

## 2012-11-06 ENCOUNTER — Ambulatory Visit: Payer: Medicare Other

## 2012-11-11 ENCOUNTER — Other Ambulatory Visit: Payer: Medicare Other | Admitting: Lab

## 2012-11-13 ENCOUNTER — Other Ambulatory Visit: Payer: Medicare Other | Admitting: Lab

## 2012-11-13 ENCOUNTER — Ambulatory Visit (HOSPITAL_BASED_OUTPATIENT_CLINIC_OR_DEPARTMENT_OTHER): Payer: Medicare Other

## 2012-11-13 ENCOUNTER — Other Ambulatory Visit: Payer: Self-pay | Admitting: Oncology

## 2012-11-13 ENCOUNTER — Other Ambulatory Visit (HOSPITAL_BASED_OUTPATIENT_CLINIC_OR_DEPARTMENT_OTHER): Payer: Medicare Other | Admitting: Lab

## 2012-11-13 VITALS — BP 154/74 | HR 65 | Temp 97.6°F | Resp 17

## 2012-11-13 DIAGNOSIS — C8313 Mantle cell lymphoma, intra-abdominal lymph nodes: Secondary | ICD-10-CM

## 2012-11-13 DIAGNOSIS — Z5112 Encounter for antineoplastic immunotherapy: Secondary | ICD-10-CM

## 2012-11-13 DIAGNOSIS — Z5111 Encounter for antineoplastic chemotherapy: Secondary | ICD-10-CM

## 2012-11-13 LAB — CBC WITH DIFFERENTIAL/PLATELET
Basophils Absolute: 0.1 10*3/uL (ref 0.0–0.1)
Eosinophils Absolute: 0.4 10*3/uL (ref 0.0–0.5)
HCT: 35.5 % — ABNORMAL LOW (ref 38.4–49.9)
HGB: 11.8 g/dL — ABNORMAL LOW (ref 13.0–17.1)
LYMPH%: 16.4 % (ref 14.0–49.0)
MCHC: 33.2 g/dL (ref 32.0–36.0)
MONO#: 0.8 10*3/uL (ref 0.1–0.9)
NEUT#: 3.5 10*3/uL (ref 1.5–6.5)
NEUT%: 61.2 % (ref 39.0–75.0)
Platelets: 175 10*3/uL (ref 140–400)
WBC: 5.7 10*3/uL (ref 4.0–10.3)
lymph#: 0.9 10*3/uL (ref 0.9–3.3)

## 2012-11-13 MED ORDER — SODIUM CHLORIDE 0.9 % IJ SOLN
10.0000 mL | INTRAMUSCULAR | Status: DC | PRN
  Administered 2012-11-13: 10 mL
  Filled 2012-11-13: qty 10

## 2012-11-13 MED ORDER — SODIUM CHLORIDE 0.9 % IV SOLN
190.0000 mg/m2 | Freq: Once | INTRAVENOUS | Status: AC
  Administered 2012-11-13: 400 mg via INTRAVENOUS
  Filled 2012-11-13: qty 20

## 2012-11-13 MED ORDER — DEXAMETHASONE SODIUM PHOSPHATE 4 MG/ML IJ SOLN
20.0000 mg | Freq: Once | INTRAMUSCULAR | Status: AC
  Administered 2012-11-13: 20 mg via INTRAVENOUS

## 2012-11-13 MED ORDER — SODIUM CHLORIDE 0.9 % IV SOLN
Freq: Once | INTRAVENOUS | Status: AC
  Administered 2012-11-13: 11:00:00 via INTRAVENOUS

## 2012-11-13 MED ORDER — BORTEZOMIB CHEMO SQ INJECTION 3.5 MG (2.5MG/ML)
1.3000 mg/m2 | Freq: Once | INTRAMUSCULAR | Status: AC
  Administered 2012-11-13: 2.75 mg via SUBCUTANEOUS
  Filled 2012-11-13: qty 2.75

## 2012-11-13 MED ORDER — HEPARIN SOD (PORK) LOCK FLUSH 100 UNIT/ML IV SOLN
500.0000 [IU] | Freq: Once | INTRAVENOUS | Status: AC | PRN
  Administered 2012-11-13: 500 [IU]
  Filled 2012-11-13: qty 5

## 2012-11-13 MED ORDER — ONDANSETRON 8 MG/50ML IVPB (CHCC)
8.0000 mg | Freq: Once | INTRAVENOUS | Status: AC
Start: 2012-11-13 — End: 2012-11-13
  Administered 2012-11-13: 8 mg via INTRAVENOUS

## 2012-11-13 NOTE — Patient Instructions (Addendum)
[facility] Discharge Instructions for Patients Receiving Chemotherapy  Today you received the following chemotherapy agents: Velcade and Cytoxan.  To help prevent nausea and vomiting after your treatment, we encourage you to take your nausea medication.    If you develop nausea and vomiting that is not controlled by your nausea medication, call the clinic.    BELOW ARE SYMPTOMS THAT SHOULD BE REPORTED IMMEDIATELY:  *FEVER GREATER THAN 100.5 F  *CHILLS WITH OR WITHOUT FEVER  NAUSEA AND VOMITING THAT IS NOT CONTROLLED WITH YOUR NAUSEA MEDICATION  *UNUSUAL SHORTNESS OF BREATH  *UNUSUAL BRUISING OR BLEEDING  TENDERNESS IN MOUTH AND THROAT WITH OR WITHOUT PRESENCE OF ULCERS  *URINARY PROBLEMS  *BOWEL PROBLEMS  UNUSUAL RASH Items with * indicate a potential emergency and should be followed up as soon as possible.  Feel free to call the clinic you have any questions or concerns. The clinic phone number is [phone].

## 2012-11-16 ENCOUNTER — Other Ambulatory Visit: Payer: Medicare Other | Admitting: Lab

## 2012-11-16 ENCOUNTER — Ambulatory Visit: Payer: Medicare Other | Admitting: Oncology

## 2012-11-18 ENCOUNTER — Other Ambulatory Visit: Payer: Medicare Other | Admitting: Lab

## 2012-11-20 ENCOUNTER — Other Ambulatory Visit: Payer: Medicare Other | Admitting: Lab

## 2012-11-20 ENCOUNTER — Other Ambulatory Visit (HOSPITAL_BASED_OUTPATIENT_CLINIC_OR_DEPARTMENT_OTHER): Payer: Medicare Other | Admitting: Lab

## 2012-11-20 ENCOUNTER — Ambulatory Visit (HOSPITAL_BASED_OUTPATIENT_CLINIC_OR_DEPARTMENT_OTHER): Payer: Medicare Other

## 2012-11-20 VITALS — BP 139/73 | HR 60 | Temp 97.1°F | Resp 20

## 2012-11-20 DIAGNOSIS — C8313 Mantle cell lymphoma, intra-abdominal lymph nodes: Secondary | ICD-10-CM

## 2012-11-20 DIAGNOSIS — Z5112 Encounter for antineoplastic immunotherapy: Secondary | ICD-10-CM

## 2012-11-20 LAB — CBC WITH DIFFERENTIAL/PLATELET
BASO%: 0.5 % (ref 0.0–2.0)
Basophils Absolute: 0 10*3/uL (ref 0.0–0.1)
EOS%: 7.2 % — ABNORMAL HIGH (ref 0.0–7.0)
HCT: 35.7 % — ABNORMAL LOW (ref 38.4–49.9)
HGB: 12 g/dL — ABNORMAL LOW (ref 13.0–17.1)
MCH: 34 pg — ABNORMAL HIGH (ref 27.2–33.4)
MONO#: 0.7 10*3/uL (ref 0.1–0.9)
NEUT%: 47.3 % (ref 39.0–75.0)
RDW: 13.9 % (ref 11.0–14.6)
WBC: 4.2 10*3/uL (ref 4.0–10.3)
lymph#: 1.2 10*3/uL (ref 0.9–3.3)

## 2012-11-20 MED ORDER — HEPARIN SOD (PORK) LOCK FLUSH 100 UNIT/ML IV SOLN
500.0000 [IU] | Freq: Once | INTRAVENOUS | Status: AC | PRN
  Administered 2012-11-20: 500 [IU]
  Filled 2012-11-20: qty 5

## 2012-11-20 MED ORDER — BORTEZOMIB CHEMO SQ INJECTION 3.5 MG (2.5MG/ML)
1.3000 mg/m2 | Freq: Once | INTRAMUSCULAR | Status: AC
  Administered 2012-11-20: 2.75 mg via SUBCUTANEOUS
  Filled 2012-11-20: qty 2.75

## 2012-11-20 MED ORDER — SODIUM CHLORIDE 0.9 % IV SOLN
190.0000 mg/m2 | Freq: Once | INTRAVENOUS | Status: AC
  Administered 2012-11-20: 400 mg via INTRAVENOUS
  Filled 2012-11-20: qty 20

## 2012-11-20 MED ORDER — SODIUM CHLORIDE 0.9 % IJ SOLN
10.0000 mL | INTRAMUSCULAR | Status: DC | PRN
  Administered 2012-11-20: 10 mL
  Filled 2012-11-20: qty 10

## 2012-11-20 MED ORDER — SODIUM CHLORIDE 0.9 % IV SOLN
Freq: Once | INTRAVENOUS | Status: AC
  Administered 2012-11-20: 11:00:00 via INTRAVENOUS

## 2012-11-20 MED ORDER — ONDANSETRON 8 MG/50ML IVPB (CHCC)
8.0000 mg | Freq: Once | INTRAVENOUS | Status: AC
  Administered 2012-11-20: 8 mg via INTRAVENOUS

## 2012-11-20 NOTE — Patient Instructions (Addendum)
[facility] Discharge Instructions for Patients Receiving Chemotherapy  Today you received the following chemotherapy agents Velcade and Cytoxan.  To help prevent nausea and vomiting after your treatment, we encourage you to take your nausea medication as prescribed.    If you develop nausea and vomiting that is not controlled by your nausea medication, call the clinic. If it is after clinic hours your family physician or the after hours number for the clinic or go to the Emergency Department.   BELOW ARE SYMPTOMS THAT SHOULD BE REPORTED IMMEDIATELY:  *FEVER GREATER THAN 100.5 F  *CHILLS WITH OR WITHOUT FEVER  NAUSEA AND VOMITING THAT IS NOT CONTROLLED WITH YOUR NAUSEA MEDICATION  *UNUSUAL SHORTNESS OF BREATH  *UNUSUAL BRUISING OR BLEEDING  TENDERNESS IN MOUTH AND THROAT WITH OR WITHOUT PRESENCE OF ULCERS  *URINARY PROBLEMS  *BOWEL PROBLEMS  UNUSUAL RASH Items with * indicate a potential emergency and should be followed up as soon as possible.   Please let the nurse know about any problems that you may have experienced. Feel free to call the clinic you have any questions or concerns. The clinic phone number is [phone].   I have been informed and understand all the instructions given to me. I know to contact the clinic, my physician, or go to the Emergency Department if any problems should occur. I do not have any questions at this time, but understand that I may call the clinic during office hours   should I have any questions or need assistance in obtaining follow up care.    __________________________________________  _____________  __________ Signature of Patient or Authorized Representative            Date                   Time    __________________________________________ Nurse's Signature

## 2012-11-23 ENCOUNTER — Other Ambulatory Visit: Payer: Self-pay | Admitting: Cardiology

## 2012-11-25 ENCOUNTER — Other Ambulatory Visit: Payer: Medicare Other | Admitting: Lab

## 2012-11-27 ENCOUNTER — Other Ambulatory Visit (HOSPITAL_BASED_OUTPATIENT_CLINIC_OR_DEPARTMENT_OTHER): Payer: Medicare Other | Admitting: Lab

## 2012-11-27 ENCOUNTER — Ambulatory Visit (HOSPITAL_BASED_OUTPATIENT_CLINIC_OR_DEPARTMENT_OTHER): Payer: Medicare Other

## 2012-11-27 VITALS — BP 141/71 | HR 59 | Temp 98.6°F | Resp 20

## 2012-11-27 DIAGNOSIS — C8313 Mantle cell lymphoma, intra-abdominal lymph nodes: Secondary | ICD-10-CM

## 2012-11-27 DIAGNOSIS — Z5112 Encounter for antineoplastic immunotherapy: Secondary | ICD-10-CM

## 2012-11-27 DIAGNOSIS — Z5111 Encounter for antineoplastic chemotherapy: Secondary | ICD-10-CM

## 2012-11-27 LAB — CBC WITH DIFFERENTIAL/PLATELET
BASO%: 0.5 % (ref 0.0–2.0)
Basophils Absolute: 0 10*3/uL (ref 0.0–0.1)
HCT: 34.6 % — ABNORMAL LOW (ref 38.4–49.9)
HGB: 11.5 g/dL — ABNORMAL LOW (ref 13.0–17.1)
LYMPH%: 24.3 % (ref 14.0–49.0)
MCH: 33.7 pg — ABNORMAL HIGH (ref 27.2–33.4)
MCHC: 33.2 g/dL (ref 32.0–36.0)
MONO#: 0.6 10*3/uL (ref 0.1–0.9)
NEUT%: 53.3 % (ref 39.0–75.0)
Platelets: 142 10*3/uL (ref 140–400)

## 2012-11-27 MED ORDER — BORTEZOMIB CHEMO SQ INJECTION 3.5 MG (2.5MG/ML)
1.3000 mg/m2 | Freq: Once | INTRAMUSCULAR | Status: AC
  Administered 2012-11-27: 2.75 mg via SUBCUTANEOUS
  Filled 2012-11-27: qty 2.75

## 2012-11-27 MED ORDER — ONDANSETRON 8 MG/50ML IVPB (CHCC)
8.0000 mg | Freq: Once | INTRAVENOUS | Status: AC
  Administered 2012-11-27: 8 mg via INTRAVENOUS

## 2012-11-27 MED ORDER — HEPARIN SOD (PORK) LOCK FLUSH 100 UNIT/ML IV SOLN
500.0000 [IU] | Freq: Once | INTRAVENOUS | Status: AC | PRN
  Administered 2012-11-27: 500 [IU]
  Filled 2012-11-27: qty 5

## 2012-11-27 MED ORDER — ACETAMINOPHEN 325 MG PO TABS
650.0000 mg | ORAL_TABLET | Freq: Once | ORAL | Status: AC
  Administered 2012-11-27: 650 mg via ORAL

## 2012-11-27 MED ORDER — SODIUM CHLORIDE 0.9 % IV SOLN
190.0000 mg/m2 | Freq: Once | INTRAVENOUS | Status: AC
  Administered 2012-11-27: 400 mg via INTRAVENOUS
  Filled 2012-11-27: qty 20

## 2012-11-27 MED ORDER — DEXAMETHASONE SODIUM PHOSPHATE 4 MG/ML IJ SOLN
20.0000 mg | Freq: Once | INTRAMUSCULAR | Status: AC
  Administered 2012-11-27: 20 mg via INTRAVENOUS

## 2012-11-27 MED ORDER — SODIUM CHLORIDE 0.9 % IJ SOLN
10.0000 mL | INTRAMUSCULAR | Status: DC | PRN
  Administered 2012-11-27: 10 mL
  Filled 2012-11-27: qty 10

## 2012-11-27 MED ORDER — SODIUM CHLORIDE 0.9 % IV SOLN
Freq: Once | INTRAVENOUS | Status: AC
  Administered 2012-11-27: 11:00:00 via INTRAVENOUS

## 2012-11-27 MED ORDER — SODIUM CHLORIDE 0.9 % IV SOLN
375.0000 mg/m2 | Freq: Once | INTRAVENOUS | Status: AC
  Administered 2012-11-27: 800 mg via INTRAVENOUS
  Filled 2012-11-27: qty 80

## 2012-11-27 MED ORDER — DIPHENHYDRAMINE HCL 25 MG PO CAPS
25.0000 mg | ORAL_CAPSULE | Freq: Once | ORAL | Status: AC
  Administered 2012-11-27: 25 mg via ORAL

## 2012-11-27 NOTE — Patient Instructions (Addendum)
[facility] Discharge Instructions for Patients Receiving Chemotherapy  Today you received the following chemotherapy agents Velcade, Cytoxan and Rituxan  To help prevent nausea and vomiting after your treatment, we encourage you to take your nausea medication as ordered per MD.    If you develop nausea and vomiting that is not controlled by your nausea medication, call the clinic. If it is after clinic hours your family physician or the after hours number for the clinic or go to the Emergency Department.   BELOW ARE SYMPTOMS THAT SHOULD BE REPORTED IMMEDIATELY:  *FEVER GREATER THAN 100.5 F  *CHILLS WITH OR WITHOUT FEVER  NAUSEA AND VOMITING THAT IS NOT CONTROLLED WITH YOUR NAUSEA MEDICATION  *UNUSUAL SHORTNESS OF BREATH  *UNUSUAL BRUISING OR BLEEDING  TENDERNESS IN MOUTH AND THROAT WITH OR WITHOUT PRESENCE OF ULCERS  *URINARY PROBLEMS  *BOWEL PROBLEMS  UNUSUAL RASH Items with * indicate a potential emergency and should be followed up as soon as possible.  . Please let the nurse know about any problems that you may have experienced. Feel free to call the clinic you have any questions or concerns. The clinic phone number is [phone].   I have been informed and understand all the instructions given to me. I know to contact the clinic, my physician, or go to the Emergency Department if any problems should occur. I do not have any questions at this time, but understand that I may call the clinic during office hours   should I have any questions or need assistance in obtaining follow up care.    __________________________________________  _____________  __________ Signature of Patient or Authorized Representative            Date                   Time    __________________________________________ Nurse's Signature

## 2012-11-30 ENCOUNTER — Ambulatory Visit (HOSPITAL_COMMUNITY)
Admission: RE | Admit: 2012-11-30 | Discharge: 2012-11-30 | Disposition: A | Discharge status: Home / Self Care | Payer: Medicare Other | Source: Ambulatory Visit | Attending: Oncology | Admitting: Oncology

## 2012-11-30 DIAGNOSIS — K4090 Unilateral inguinal hernia, without obstruction or gangrene, not specified as recurrent: Secondary | ICD-10-CM | POA: Insufficient documentation

## 2012-11-30 DIAGNOSIS — Z79899 Other long term (current) drug therapy: Secondary | ICD-10-CM | POA: Insufficient documentation

## 2012-11-30 DIAGNOSIS — Z8529 Personal history of malignant neoplasm of other respiratory and intrathoracic organs: Secondary | ICD-10-CM | POA: Insufficient documentation

## 2012-11-30 DIAGNOSIS — C8319 Mantle cell lymphoma, extranodal and solid organ sites: Secondary | ICD-10-CM | POA: Insufficient documentation

## 2012-11-30 DIAGNOSIS — C8313 Mantle cell lymphoma, intra-abdominal lymph nodes: Secondary | ICD-10-CM

## 2012-11-30 MED ORDER — IOHEXOL 300 MG/ML  SOLN
100.0000 mL | Freq: Once | INTRAMUSCULAR | Status: AC | PRN
  Administered 2012-11-30: 100 mL via INTRAVENOUS

## 2012-11-30 NOTE — Progress Notes (Signed)
Quick Note:  Please notify patient and call/fax these results to patient's doctors. ______ 

## 2012-12-01 ENCOUNTER — Other Ambulatory Visit: Payer: Self-pay | Admitting: Oncology

## 2012-12-01 ENCOUNTER — Telehealth: Payer: Self-pay | Admitting: Medical Oncology

## 2012-12-01 NOTE — Telephone Encounter (Signed)
Pt's wife called and state that pt is having issues with constipation since his CT scan. He has drank prune juice and used benefiber without any results. We discussed laxatives and stool softeners. I asked him to get him a bottle of magnesium citrate and drink 1/2 bottle,wait an hour if no results to drink the 2nd 1/2. He can also [place] dulcolax suppos. If no results to call us back. She voiced understanding.

## 2012-12-03 ENCOUNTER — Telehealth: Payer: Self-pay | Admitting: Medical Oncology

## 2012-12-03 NOTE — Telephone Encounter (Signed)
I called pt with his CT results. I also asked if he got his constipation issue resolved. He states yes and he is doing fine.

## 2012-12-04 ENCOUNTER — Telehealth: Payer: Self-pay | Admitting: *Deleted

## 2012-12-04 ENCOUNTER — Ambulatory Visit (HOSPITAL_BASED_OUTPATIENT_CLINIC_OR_DEPARTMENT_OTHER): Payer: Medicare Other

## 2012-12-04 ENCOUNTER — Other Ambulatory Visit (HOSPITAL_BASED_OUTPATIENT_CLINIC_OR_DEPARTMENT_OTHER): Payer: Medicare Other | Admitting: Lab

## 2012-12-04 ENCOUNTER — Telehealth: Payer: Self-pay | Admitting: Oncology

## 2012-12-04 ENCOUNTER — Ambulatory Visit (HOSPITAL_BASED_OUTPATIENT_CLINIC_OR_DEPARTMENT_OTHER): Payer: Medicare Other | Admitting: Oncology

## 2012-12-04 ENCOUNTER — Encounter: Payer: Self-pay | Admitting: Oncology

## 2012-12-04 VITALS — BP 145/68 | HR 60 | Temp 97.3°F | Resp 18 | Ht 67.0 in | Wt 207.3 lb

## 2012-12-04 DIAGNOSIS — Z90411 Acquired partial absence of pancreas: Secondary | ICD-10-CM

## 2012-12-04 DIAGNOSIS — C8313 Mantle cell lymphoma, intra-abdominal lymph nodes: Secondary | ICD-10-CM

## 2012-12-04 LAB — COMPREHENSIVE METABOLIC PANEL (CC13)
ALT: 11 U/L (ref 0–55)
AST: 21 U/L (ref 5–34)
Albumin: 3.4 g/dL — ABNORMAL LOW (ref 3.5–5.0)
Alkaline Phosphatase: 63 U/L (ref 40–150)
BUN: 23.9 mg/dL (ref 7.0–26.0)
CO2: 26 mEq/L (ref 22–29)
Calcium: 8.8 mg/dL (ref 8.4–10.4)
Chloride: 108 mEq/L — ABNORMAL HIGH (ref 98–107)
Creatinine: 1.3 mg/dL (ref 0.7–1.3)
Glucose: 98 mg/dl (ref 70–99)
Potassium: 4.6 mEq/L (ref 3.5–5.1)
Sodium: 142 mEq/L (ref 136–145)
Total Bilirubin: 0.81 mg/dL (ref 0.20–1.20)
Total Protein: 5.8 g/dL — ABNORMAL LOW (ref 6.4–8.3)

## 2012-12-04 LAB — CBC WITH DIFFERENTIAL/PLATELET
BASO%: 0.5 % (ref 0.0–2.0)
EOS%: 9.5 % — ABNORMAL HIGH (ref 0.0–7.0)
HCT: 34.5 % — ABNORMAL LOW (ref 38.4–49.9)
LYMPH%: 22.4 % (ref 14.0–49.0)
MCH: 33.9 pg — ABNORMAL HIGH (ref 27.2–33.4)
MCHC: 33.3 g/dL (ref 32.0–36.0)
MCV: 101.8 fL — ABNORMAL HIGH (ref 79.3–98.0)
MONO#: 0.7 10*3/uL (ref 0.1–0.9)
MONO%: 18.7 % — ABNORMAL HIGH (ref 0.0–14.0)
NEUT%: 48.9 % (ref 39.0–75.0)
Platelets: 151 10*3/uL (ref 140–400)
RBC: 3.39 10*6/uL — ABNORMAL LOW (ref 4.20–5.82)
WBC: 3.8 10*3/uL — ABNORMAL LOW (ref 4.0–10.3)
nRBC: 0 % (ref 0–0)

## 2012-12-04 MED ORDER — SODIUM CHLORIDE 0.9 % IV SOLN
Freq: Once | INTRAVENOUS | Status: AC
  Administered 2012-12-04: 14:00:00 via INTRAVENOUS

## 2012-12-04 MED ORDER — SODIUM CHLORIDE 0.9 % IJ SOLN
10.0000 mL | INTRAMUSCULAR | Status: DC | PRN
  Administered 2012-12-04: 10 mL
  Filled 2012-12-04: qty 10

## 2012-12-04 MED ORDER — ONDANSETRON 8 MG/50ML IVPB (CHCC)
8.0000 mg | Freq: Once | INTRAVENOUS | Status: AC
  Administered 2012-12-04: 8 mg via INTRAVENOUS

## 2012-12-04 MED ORDER — HEPARIN SOD (PORK) LOCK FLUSH 100 UNIT/ML IV SOLN
500.0000 [IU] | Freq: Once | INTRAVENOUS | Status: AC | PRN
  Administered 2012-12-04: 500 [IU]
  Filled 2012-12-04: qty 5

## 2012-12-04 MED ORDER — BORTEZOMIB CHEMO SQ INJECTION 3.5 MG (2.5MG/ML)
1.3000 mg/m2 | Freq: Once | INTRAMUSCULAR | Status: AC
  Administered 2012-12-04: 2.75 mg via SUBCUTANEOUS
  Filled 2012-12-04: qty 2.75

## 2012-12-04 MED ORDER — DEXAMETHASONE SODIUM PHOSPHATE 4 MG/ML IJ SOLN
20.0000 mg | Freq: Once | INTRAMUSCULAR | Status: AC
  Administered 2012-12-04: 20 mg via INTRAVENOUS

## 2012-12-04 MED ORDER — SODIUM CHLORIDE 0.9 % IV SOLN
190.0000 mg/m2 | Freq: Once | INTRAVENOUS | Status: AC
  Administered 2012-12-04: 400 mg via INTRAVENOUS
  Filled 2012-12-04: qty 20

## 2012-12-04 NOTE — Telephone Encounter (Signed)
Per staff message and POF I have scheduled appts.  JMW  

## 2012-12-04 NOTE — Patient Instructions (Addendum)
 [facility] Discharge Inst[facility] for Patients Receiving Chemotherapy  Today you received the following chemotherapy agents: Cytoxan, Velcade  To help prevent nausea and vomiting after your treatment, we encourage you to take your nausea medication as directed by your MD.  If you develop nausea and vomiting that is not controlled by your nausea medication, call the clinic. If it is after clinic hours your family physician or the after hours number for the clinic or go to the Emergency Department.   BELOW ARE SYMPTOMS THAT SHOULD BE REPORTED IMMEDIATELY:  *FEVER GREATER THAN 100.5 F  *CHILLS WITH OR WITHOUT FEVER  NAUSEA AND VOMITING THAT IS NOT CONTROLLED WITH YOUR NAUSEA MEDICATION  *UNUSUAL SHORTNESS OF BREATH  *UNUSUAL BRUISING OR BLEEDING  TENDERNESS IN MOUTH AND THROAT WITH OR WITHOUT PRESENCE OF ULCERS  *URINARY PROBLEMS  *BOWEL PROBLEMS  UNUSUAL RASH Items with * indicate a potential emergency and should be followed up as soon as possible.   Feel free to call the clinic you have any questions or concerns. The clinic phone number is [phone].

## 2012-12-04 NOTE — Telephone Encounter (Signed)
gv and printed pt appt schedule and avs for April and may...emailed michelle to add tx...lm for Roby to get est time for 6.6.14.Marland KitchenMarland KitchenMarland Kitchen

## 2012-12-04 NOTE — Telephone Encounter (Signed)
added pt for 6.6.14 @ 8:30am for Dr. Arline Asp...emailed MW to add June tx....pt will come back to pick up schedule

## 2012-12-04 NOTE — Progress Notes (Signed)
This office note has been dictated.  #[id number]

## 2012-12-05 NOTE — Progress Notes (Signed)
CC:   Martin Morris, M.D.  PROBLEM LIST:  1. Mantle cell lymphoma diagnosed in March 2008 with positive bone  marrow initially on 12/03/2006 and then negative bone marrow after  treatment in October 2009. As stated, the patient presented with  obstructive liver abnormalities requiring ERCP and non metal stent  placement by Dr. Melvia Heaps. The stent was subsequently removed  in October 2008. The patient was treated with 8 cycles of Rituxan,  Cytoxan, vincristine and Decadron in combination with Neulasta from  12/19/2006 through 05/15/2007. The patient then received  maintenance Rituxan from July 16, 2007 through February 01, 2010.  While on Rituxan, he had a recurrence of his disease by CT scan  carried out on 01/24/2010. Martin Morris then received treatment  with bendamustine and Rituxan in combination with Neulasta for 6  cycles from 02/19/2010 through 07/26/2010. We have a prior PET scan from  08/22/2010 and CT scans of chest, abdomen, and pelvis from 01/01/2011 that showed no evidence of disease. He had been off treatment since December 2011 and had been doing well without any symptoms until the CT scan of the abdomen and pelvis on 07/01/2011 showed signs of recurrence. CT scans of abdomen and pelvis with IVC on 09/02/11 showed further progression.  Rituxan, subcutaneous Velcade, intravenous Cytoxan and Decadron  were initiated on 10/15/2011. CT scans of the abdomen and pelvis carried out on 01/08/2012 showed a near complete response to therapy. CT scan of the abdomen and pelvis with IV contrast carried out on 06/19/2012 showed stable plaque-like soft tissue density in the upper abdominal retroperitoneum compared with the CT scan of 01/08/2012. CT scan of the abdomen and pelvis with IV contrast carried out on 11/30/2012 showed no evidence of recurrent lymphoma.  2. Coronary artery disease, S/P 2 MIs, most recently 03/21/10. Heart cath on 03/22/10. LVEF is 40-50%.  3. Peripheral vascular  disease.  4. Dyslipidemia.  5. Hypertension.  6. History of depression.  7. History of kidney stones.  8. History of sleep apnea.  9. History of colonic polyps.  10. 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 Kathlee Nations Trigt, status post radiation treatments.  11. Easy bruising.  12. Diverticulosis seen on CT scans done 06/19/2012.  13. Right Port-A-Cath placed on 11/20/2011.      MEDICATIONS:  Reviewed and recorded. Current Outpatient Prescriptions  Medication Sig Dispense Refill  . acyclovir (ZOVIRAX) 400 MG tablet TAKE ONE TABLET BY MOUTH TWICE DAILY  60 tablet  1  . amLODipine (NORVASC) 2.5 MG tablet Take 2.5 mg by mouth daily.        Marland Kitchen aspirin 81 MG tablet Take 81 mg by mouth every other day.      Marland Kitchen atenolol (TENORMIN) 50 MG tablet TAKE ONE TABLET BY MOUTH EVERY DAY  30 tablet  6  . citalopram (CELEXA) 20 MG tablet TAKE ONE TABLET BY MOUTH EVERY DAY  30 tablet  1  . clopidogrel (PLAVIX) 75 MG tablet TAKE ONE TABLET BY MOUTH EVERY DAY  30 tablet  10  . furosemide (LASIX) 20 MG tablet Take 20 mg by mouth daily as needed. For fluid.      . isosorbide mononitrate (IMDUR) 120 MG 24 hr tablet TAKE ONE TABLET BY MOUTH EVERY DAY  30 tablet  11  . lidocaine-prilocaine (EMLA) cream Apply to port 1 hour before treatment. Cover site with plastic wrap.  30 g  11  . naproxen sodium (ANAPROX) 220 MG tablet  Take 440 mg by mouth 2 (two) times daily as needed. For pain.      . nitroGLYCERIN (NITROSTAT) 0.4 MG SL tablet Place 1 tablet (0.4 mg total) under the tongue every 5 (five) minutes as needed. For chest pain.  25 tablet  prn  . pravastatin (PRAVACHOL) 40 MG tablet TAKE TWO TABLETS BY MOUTH AT BEDTIME  60 tablet  5  . PRESCRIPTION MEDICATION Inject into the vein once a week. Velcade and Cytoxan.      . sulfamethoxazole-trimethoprim (BACTRIM DS) 800-160 MG per tablet TAKE ONE TABLET BY MOUTH EVERY DAY AS DIRECTED. TAKE ONE TABLET ON MONDAY, WEDNESDAY, AND  FRIDAY.  24 tablet  0   No current facility-administered medications for this visit.   Facility-Administered Medications Ordered in Other Visits  Medication Dose Route Frequency Provider Last Rate Last Dose  . sodium chloride 0.9 % injection 10 mL  10 mL Intracatheter PRN Samul Dada, MD   10 mL at 08/03/12 1419     TREATMENT PROGRAM:  As of 12/04/2012, treatment program will be as follows: -Velcade 2.75 mg subcu. -Cytoxan 400 mg IV. -Decadron 20 mg IV. The above drugs will be given every 2 weeks. -Rituxan 800 mg IV every 4 weeks.  Previously, beginning on 10/15/2011, Velcade, Cytoxan, and Decadron were being administered weekly, and Rituxan was being administered every 3 weeks.  IMMUNIZATIONS:  Pneumovax was administered 09/06/2011.  Flu shot was administered in early December 2012 and on 05/13/2012.   SMOKING HISTORY: The patient has never smoked cigarettes.   HISTORY:  Martin Morris was seen today for followup of his relapsed mantle cell lymphoma with original diagnosis going back to March 2008. Martin Morris was accompanied by his wife, Martin Morris.  He was last seen by [place] on 11/04/2012.  He had received chemotherapy with all 4 drugs on 11/04/2012.  He then continued with Velcade, Cytoxan, and Decadron being given on 03/21, 03/28, and 04/04.  Rituxan was administered on 11/27/2012.  The patient is here today for re-evaluation and treatment.  It will be recalled that he had a CT scan of the abdomen and pelvis with IV contrast on 11/30/2012 that showed no evidence of recurrent lymphoma.  The patient is without complaints today.  He tolerates his chemotherapy well.  There are no symptoms related to his underlying lymphoma.  PHYSICAL EXAM:  Patient shows no obvious changes.  Weight is 207 pounds 4.8 ounces.  Weight is stable.  Height 5 feet 7 inches.  Body surface area 2.11 meters squared.  Blood pressure today 145/68.  Other vital signs are normal. HEENT: There is no  scleral icterus. Mouth and pharynx are benign. There is no peripheral adenopathy palpable. Chest: The heart and lungs are normal. There is a right-sided Port-A-Cath. Abdomen: Basically obese, but  benign. I do not feel any induration in the area above the umbilicus,  where we have felt some induration previously. A diastasis recti is present. Extremities: Continue to show 3+ edema of both lower legs. Neurologic: Exam is normal.    LABORATORY DATA:  Today, white count 3.8, ANC 1.9, hemoglobin 11.5, hematocrit 34.5, platelets 151,000.  Chemistries today are pending. Chemistries from 11/04/2012 were notable for an LDH of 331, albumin 3.5, BUN 13, creatinine 0.9.  Other labs were normal.  IMAGING STUDIES:  1. CT scan of chest, abdomen and pelvis with IV contrast on 01/01/2011  were negative for evidence of recurrent lymphoma.  2. PET scan from 08/22/2010 showed no evidence for recurrent lymphoma.  3. CT scan of chest, abdomen and pelvis with IV contrast on 01/01/2011  showed no evidence for lymphoma.  4. CT scan of abdomen and pelvis on 07/01/2011 showed interval  development of peritoneal disease worrisome for recurrence of  lymphoma. There was new right external iliac lymph node  pathologically enlarged and worrisome for recurrent lymphoma.  There was some stable appearance of mild soft tissue stranding  surrounding the celiac trunk and superior mesenteric artery.  5. Chest x-ray, 2 view, from 09/02/2011 showed no acute findings.  6. CT scan of abdomen and pelvis with IV contrast on 09/02/2011 showed  interval progression of lymphadenopathy in the abdomen and pelvis.  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 The 1.1 x 1.7 cm omental nodule  which was measured on the prior study of 07/01/2011 now measures  4.1 x 2.4 cm. A 2nd discrete soft tissue nodule in the omentum  which previously measured 1.1 x 1.5 cm now measures 2.2 x 3.0 cm.  7. CT scan of abdomen and pelvis with IV contrast on 01/08/2012  showed no residual measurable mass along the anterior aspect of the right diaphragm at the  site of the previously demonstrated 4.9 x 2.1 cm nodule. There is no  residual measurable gastrohepatic or celiac lymphadenopathy. There is  some soft tissue stranding around the proximal abdominal aorta, celiac  trunk and superior mesenteric artery as noted on images 27-34. Pelvic  lymphadenopathy is nearly completely resolved. A right external iliac  node measuring 6 mm on image 66 previously measured 9 mm. Another node  which previously measured 17 mm short axis now measures 8 mm on image  70. There is no residual inguinal adenopathy. The spleen remains  normal in size, demonstrates no focal abnormalities. There are no  suspect osseous findings. There is no residual mass at the left  posterior costophrenic angle. The final impression was near complete  response to therapy in the lymphadenopathy and omental disease with some  minimal residual measurable disease as previously noted. There was an  enlarged left inguinal hernia containing a knuckle of sigmoid colon with  no evidence for incarceration or bowel obstruction. There was a  nonobstructing left renal calculus.  8. CT scan of abdomen and pelvis with IV contrast carried out on 06/19/2012  was compared with the scan of 01/08/2012 and showed stable plaque-like soft tissue density in the upper abdominal retroperitoneum, which had the appearance of treated lymphoma. No new or progressive disease within the abdomen or pelvis  was noted. There is a stable incidental finding of a small left  inguinal hernia and diverticulosis.  9. Chest x-ray, 2-view, from 09/07/2012 showed low lung volumes with  streaky basilar atelectasis. 10. CT scan of abdomen and pelvis with IV contrast from 11/30/2012 compared with the prior CT scans from 06/19/2012 showed no significant changes and no evidence of recurrent lymphoma or other acute findings.   IMPRESSION/PLAN:   Mr. Durnin continues to do well with no evidence for lymphoma either clinically or by CT scanning.  He remains, however, with an elevated LDH of uncertain etiology.  The patient will receive the following chemotherapy today:  Velcade 2.75 mg subcu, IV Cytoxan 400 mg, and IV Decadron 20 mg.  Starting today, we will repeat these 3 drugs every 2 weeks and give Rituxan every 4 weeks. Patient will be due for Rituxan 800 mg IV on 12/18/2012 and 12/16/2012. We will continue with the other 3 drugs on May 9th, May 23rd, and when I see the patient  again on June 6th.  On June 6th, we will check CBC and chemistries.    ______________________________ Samul Dada, M.D. DSM/MEDQ  D:  12/04/2012  T:  12/05/2012  Job:  [id number]

## 2012-12-07 ENCOUNTER — Telehealth: Payer: Self-pay | Admitting: *Deleted

## 2012-12-07 NOTE — Telephone Encounter (Signed)
Per staff message and POF I have scheduled appts.  JMW  

## 2012-12-18 ENCOUNTER — Ambulatory Visit (HOSPITAL_BASED_OUTPATIENT_CLINIC_OR_DEPARTMENT_OTHER): Payer: Medicare Other

## 2012-12-18 ENCOUNTER — Other Ambulatory Visit (HOSPITAL_BASED_OUTPATIENT_CLINIC_OR_DEPARTMENT_OTHER): Payer: Medicare Other | Admitting: Lab

## 2012-12-18 VITALS — BP 144/62 | HR 67 | Temp 97.1°F | Resp 18

## 2012-12-18 DIAGNOSIS — I739 Peripheral vascular disease, unspecified: Secondary | ICD-10-CM

## 2012-12-18 DIAGNOSIS — Z5112 Encounter for antineoplastic immunotherapy: Secondary | ICD-10-CM

## 2012-12-18 DIAGNOSIS — C8313 Mantle cell lymphoma, intra-abdominal lymph nodes: Secondary | ICD-10-CM

## 2012-12-18 DIAGNOSIS — Z5111 Encounter for antineoplastic chemotherapy: Secondary | ICD-10-CM

## 2012-12-18 DIAGNOSIS — L089 Local infection of the skin and subcutaneous tissue, unspecified: Secondary | ICD-10-CM

## 2012-12-18 LAB — CBC WITH DIFFERENTIAL/PLATELET
Eosinophils Absolute: 0.2 10*3/uL (ref 0.0–0.5)
HGB: 11.8 g/dL — ABNORMAL LOW (ref 13.0–17.1)
MONO#: 0.9 10*3/uL (ref 0.1–0.9)
MONO%: 20.1 % — ABNORMAL HIGH (ref 0.0–14.0)
NEUT#: 1.9 10*3/uL (ref 1.5–6.5)
RBC: 3.49 10*6/uL — ABNORMAL LOW (ref 4.20–5.82)
RDW: 13.5 % (ref 11.0–14.6)
WBC: 4.2 10*3/uL (ref 4.0–10.3)
lymph#: 1.2 10*3/uL (ref 0.9–3.3)
nRBC: 0 % (ref 0–0)

## 2012-12-18 MED ORDER — DEXAMETHASONE SODIUM PHOSPHATE 4 MG/ML IJ SOLN
20.0000 mg | Freq: Once | INTRAMUSCULAR | Status: AC
  Administered 2012-12-18: 20 mg via INTRAVENOUS

## 2012-12-18 MED ORDER — SODIUM CHLORIDE 0.9 % IV SOLN
375.0000 mg/m2 | Freq: Once | INTRAVENOUS | Status: AC
  Administered 2012-12-18: 800 mg via INTRAVENOUS
  Filled 2012-12-18: qty 80

## 2012-12-18 MED ORDER — BORTEZOMIB CHEMO SQ INJECTION 3.5 MG (2.5MG/ML)
1.3000 mg/m2 | Freq: Once | INTRAMUSCULAR | Status: AC
  Administered 2012-12-18: 2.75 mg via SUBCUTANEOUS
  Filled 2012-12-18: qty 2.75

## 2012-12-18 MED ORDER — SODIUM CHLORIDE 0.9 % IV SOLN
190.0000 mg/m2 | Freq: Once | INTRAVENOUS | Status: AC
  Administered 2012-12-18: 400 mg via INTRAVENOUS
  Filled 2012-12-18: qty 20

## 2012-12-18 MED ORDER — SODIUM CHLORIDE 0.9 % IJ SOLN
10.0000 mL | INTRAMUSCULAR | Status: DC | PRN
  Administered 2012-12-18: 10 mL
  Filled 2012-12-18: qty 10

## 2012-12-18 MED ORDER — ACETAMINOPHEN 325 MG PO TABS
650.0000 mg | ORAL_TABLET | Freq: Once | ORAL | Status: AC
  Administered 2012-12-18: 650 mg via ORAL

## 2012-12-18 MED ORDER — DIPHENHYDRAMINE HCL 25 MG PO CAPS
25.0000 mg | ORAL_CAPSULE | Freq: Once | ORAL | Status: AC
  Administered 2012-12-18: 25 mg via ORAL

## 2012-12-18 MED ORDER — SODIUM CHLORIDE 0.9 % IV SOLN
Freq: Once | INTRAVENOUS | Status: AC
  Administered 2012-12-18: 11:00:00 via INTRAVENOUS

## 2012-12-18 MED ORDER — ONDANSETRON 8 MG/50ML IVPB (CHCC)
8.0000 mg | Freq: Once | INTRAVENOUS | Status: AC
  Administered 2012-12-18: 8 mg via INTRAVENOUS

## 2012-12-18 MED ORDER — HEPARIN SOD (PORK) LOCK FLUSH 100 UNIT/ML IV SOLN
500.0000 [IU] | Freq: Once | INTRAVENOUS | Status: AC | PRN
  Administered 2012-12-18: 500 [IU]
  Filled 2012-12-18: qty 5

## 2012-12-18 NOTE — Patient Instructions (Signed)
[facility] Discharge Instructions for Patients Receiving Chemotherapy  Today you received the following chemotherapy agents Rituxan/Cytoxan/Velcade To help prevent nausea and vomiting after your treatment, we encourage you to take your nausea medication as prescribed.  If you develop nausea and vomiting that is not controlled by your nausea medication, call the clinic. If it is after clinic hours your family physician or the after hours number for the clinic or go to the Emergency Department.   BELOW ARE SYMPTOMS THAT SHOULD BE REPORTED IMMEDIATELY:  *FEVER GREATER THAN 100.5 F  *CHILLS WITH OR WITHOUT FEVER  NAUSEA AND VOMITING THAT IS NOT CONTROLLED WITH YOUR NAUSEA MEDICATION  *UNUSUAL SHORTNESS OF BREATH  *UNUSUAL BRUISING OR BLEEDING  TENDERNESS IN MOUTH AND THROAT WITH OR WITHOUT PRESENCE OF ULCERS  *URINARY PROBLEMS  *BOWEL PROBLEMS  UNUSUAL RASH Items with * indicate a potential emergency and should be followed up as soon as possible.  One of the nurses will contact you 24 hours after your treatment. Please let the nurse know about any problems that you may have experienced. Feel free to call the clinic you have any questions or concerns. The clinic phone number is [phone].   I have been informed and understand all the instructions given to me. I know to contact the clinic, my physician, or go to the Emergency Department if any problems should occur. I do not have any questions at this time, but understand that I may call the clinic during office hours   should I have any questions or need assistance in obtaining follow up care.    __________________________________________  _____________  __________ Signature of Patient or Authorized Representative            Date                   Time    __________________________________________ Nurse's Signature

## 2013-01-01 ENCOUNTER — Ambulatory Visit (HOSPITAL_BASED_OUTPATIENT_CLINIC_OR_DEPARTMENT_OTHER): Payer: Medicare Other

## 2013-01-01 ENCOUNTER — Other Ambulatory Visit (HOSPITAL_BASED_OUTPATIENT_CLINIC_OR_DEPARTMENT_OTHER): Payer: Medicare Other | Admitting: Lab

## 2013-01-01 VITALS — BP 143/73 | HR 67 | Temp 97.6°F

## 2013-01-01 DIAGNOSIS — C8313 Mantle cell lymphoma, intra-abdominal lymph nodes: Secondary | ICD-10-CM

## 2013-01-01 DIAGNOSIS — Z5112 Encounter for antineoplastic immunotherapy: Secondary | ICD-10-CM

## 2013-01-01 LAB — CBC WITH DIFFERENTIAL/PLATELET
BASO%: 1.1 % (ref 0.0–2.0)
Basophils Absolute: 0.1 10*3/uL (ref 0.0–0.1)
EOS%: 7.4 % — ABNORMAL HIGH (ref 0.0–7.0)
HCT: 35.9 % — ABNORMAL LOW (ref 38.4–49.9)
HGB: 12 g/dL — ABNORMAL LOW (ref 13.0–17.1)
LYMPH%: 27.2 % (ref 14.0–49.0)
MCH: 33.9 pg — ABNORMAL HIGH (ref 27.2–33.4)
MCHC: 33.4 g/dL (ref 32.0–36.0)
MCV: 101.4 fL — ABNORMAL HIGH (ref 79.3–98.0)
MONO%: 14.5 % — ABNORMAL HIGH (ref 0.0–14.0)
NEUT%: 49.8 % (ref 39.0–75.0)
Platelets: 181 10*3/uL (ref 140–400)
lymph#: 1.3 10*3/uL (ref 0.9–3.3)

## 2013-01-01 MED ORDER — BORTEZOMIB CHEMO SQ INJECTION 3.5 MG (2.5MG/ML)
1.3000 mg/m2 | Freq: Once | INTRAMUSCULAR | Status: AC
  Administered 2013-01-01: 2.75 mg via SUBCUTANEOUS
  Filled 2013-01-01: qty 2.75

## 2013-01-01 MED ORDER — SODIUM CHLORIDE 0.9 % IJ SOLN
10.0000 mL | INTRAMUSCULAR | Status: DC | PRN
  Administered 2013-01-01: 10 mL
  Filled 2013-01-01: qty 10

## 2013-01-01 MED ORDER — ONDANSETRON 8 MG/50ML IVPB (CHCC)
8.0000 mg | Freq: Once | INTRAVENOUS | Status: AC
  Administered 2013-01-01: 8 mg via INTRAVENOUS

## 2013-01-01 MED ORDER — SODIUM CHLORIDE 0.9 % IV SOLN
190.0000 mg/m2 | Freq: Once | INTRAVENOUS | Status: AC
  Administered 2013-01-01: 400 mg via INTRAVENOUS
  Filled 2013-01-01: qty 20

## 2013-01-01 MED ORDER — HEPARIN SOD (PORK) LOCK FLUSH 100 UNIT/ML IV SOLN
500.0000 [IU] | Freq: Once | INTRAVENOUS | Status: AC | PRN
  Administered 2013-01-01: 500 [IU]
  Filled 2013-01-01: qty 5

## 2013-01-01 MED ORDER — DEXAMETHASONE SODIUM PHOSPHATE 4 MG/ML IJ SOLN
20.0000 mg | Freq: Once | INTRAMUSCULAR | Status: AC
  Administered 2013-01-01: 20 mg via INTRAVENOUS

## 2013-01-01 MED ORDER — SODIUM CHLORIDE 0.9 % IV SOLN
Freq: Once | INTRAVENOUS | Status: AC
  Administered 2013-01-01: 11:00:00 via INTRAVENOUS

## 2013-01-01 NOTE — Patient Instructions (Signed)
[facility] Discharge Instructions for Patients Receiving Chemotherapy  Today you received the following chemotherapy agents: velcade, cytoxan  To help prevent nausea and vomiting after your treatment, we encourage you to take your nausea medication.  Take it as often as prescribed.     If you develop nausea and vomiting that is not controlled by your nausea medication, call the clinic. If it is after clinic hours your family physician or the after hours number for the clinic or go to the Emergency Department.   BELOW ARE SYMPTOMS THAT SHOULD BE REPORTED IMMEDIATELY:  *FEVER GREATER THAN 100.5 F  *CHILLS WITH OR WITHOUT FEVER  NAUSEA AND VOMITING THAT IS NOT CONTROLLED WITH YOUR NAUSEA MEDICATION  *UNUSUAL SHORTNESS OF BREATH  *UNUSUAL BRUISING OR BLEEDING  TENDERNESS IN MOUTH AND THROAT WITH OR WITHOUT PRESENCE OF ULCERS  *URINARY PROBLEMS  *BOWEL PROBLEMS  UNUSUAL RASH Items with * indicate a potential emergency and should be followed up as soon as possible.  One of the nurses will contact you 24 hours after your treatment. Please let the nurse know about any problems that you may have experienced. Feel free to call the clinic you have any questions or concerns. The clinic phone number is [phone].   I have been informed and understand all the instructions given to me. I know to contact the clinic, my physician, or go to the Emergency Department if any problems should occur. I do not have any questions at this time, but understand that I may call the clinic during office hours   should I have any questions or need assistance in obtaining follow up care.    __________________________________________  _____________  __________ Signature of Patient or Authorized Representative            Date                   Time    __________________________________________ Nurse's Signature

## 2013-01-08 ENCOUNTER — Other Ambulatory Visit: Payer: Self-pay | Admitting: Oncology

## 2013-01-08 DIAGNOSIS — C8590 Non-Hodgkin lymphoma, unspecified, unspecified site: Secondary | ICD-10-CM

## 2013-01-15 ENCOUNTER — Ambulatory Visit (HOSPITAL_BASED_OUTPATIENT_CLINIC_OR_DEPARTMENT_OTHER): Payer: Medicare Other

## 2013-01-15 ENCOUNTER — Other Ambulatory Visit (HOSPITAL_BASED_OUTPATIENT_CLINIC_OR_DEPARTMENT_OTHER): Payer: Medicare Other | Admitting: Lab

## 2013-01-15 VITALS — BP 152/69 | HR 60 | Temp 97.5°F | Resp 20

## 2013-01-15 DIAGNOSIS — Z5111 Encounter for antineoplastic chemotherapy: Secondary | ICD-10-CM

## 2013-01-15 DIAGNOSIS — C8313 Mantle cell lymphoma, intra-abdominal lymph nodes: Secondary | ICD-10-CM

## 2013-01-15 DIAGNOSIS — Z5112 Encounter for antineoplastic immunotherapy: Secondary | ICD-10-CM

## 2013-01-15 LAB — CBC WITH DIFFERENTIAL/PLATELET
Eosinophils Absolute: 0.4 10*3/uL (ref 0.0–0.5)
LYMPH%: 33.2 % (ref 14.0–49.0)
MCH: 33.1 pg (ref 27.2–33.4)
MCHC: 32.4 g/dL (ref 32.0–36.0)
MCV: 102.2 fL — ABNORMAL HIGH (ref 79.3–98.0)
MONO%: 16.8 % — ABNORMAL HIGH (ref 0.0–14.0)
NEUT#: 2.3 10*3/uL (ref 1.5–6.5)
Platelets: 186 10*3/uL (ref 140–400)
RBC: 3.62 10*6/uL — ABNORMAL LOW (ref 4.20–5.82)
nRBC: 0 % (ref 0–0)

## 2013-01-15 MED ORDER — BORTEZOMIB CHEMO SQ INJECTION 3.5 MG (2.5MG/ML)
1.3000 mg/m2 | Freq: Once | INTRAMUSCULAR | Status: AC
  Administered 2013-01-15: 2.75 mg via SUBCUTANEOUS
  Filled 2013-01-15: qty 2.75

## 2013-01-15 MED ORDER — DIPHENHYDRAMINE HCL 25 MG PO CAPS
25.0000 mg | ORAL_CAPSULE | Freq: Once | ORAL | Status: AC
  Administered 2013-01-15: 25 mg via ORAL

## 2013-01-15 MED ORDER — HEPARIN SOD (PORK) LOCK FLUSH 100 UNIT/ML IV SOLN
500.0000 [IU] | Freq: Once | INTRAVENOUS | Status: AC | PRN
  Administered 2013-01-15: 500 [IU]
  Filled 2013-01-15: qty 5

## 2013-01-15 MED ORDER — SODIUM CHLORIDE 0.9 % IV SOLN
190.0000 mg/m2 | Freq: Once | INTRAVENOUS | Status: AC
  Administered 2013-01-15: 400 mg via INTRAVENOUS
  Filled 2013-01-15: qty 20

## 2013-01-15 MED ORDER — SODIUM CHLORIDE 0.9 % IV SOLN
Freq: Once | INTRAVENOUS | Status: AC
  Administered 2013-01-15: 11:00:00 via INTRAVENOUS

## 2013-01-15 MED ORDER — DEXAMETHASONE SODIUM PHOSPHATE 4 MG/ML IJ SOLN
20.0000 mg | Freq: Once | INTRAMUSCULAR | Status: AC
  Administered 2013-01-15: 20 mg via INTRAVENOUS

## 2013-01-15 MED ORDER — ONDANSETRON 8 MG/50ML IVPB (CHCC)
8.0000 mg | Freq: Once | INTRAVENOUS | Status: AC
  Administered 2013-01-15: 8 mg via INTRAVENOUS

## 2013-01-15 MED ORDER — SODIUM CHLORIDE 0.9 % IJ SOLN
10.0000 mL | INTRAMUSCULAR | Status: DC | PRN
  Administered 2013-01-15: 10 mL
  Filled 2013-01-15: qty 10

## 2013-01-15 MED ORDER — SODIUM CHLORIDE 0.9 % IV SOLN
375.0000 mg/m2 | Freq: Once | INTRAVENOUS | Status: AC
  Administered 2013-01-15: 800 mg via INTRAVENOUS
  Filled 2013-01-15: qty 80

## 2013-01-15 MED ORDER — ACETAMINOPHEN 325 MG PO TABS
650.0000 mg | ORAL_TABLET | Freq: Once | ORAL | Status: AC
  Administered 2013-01-15: 650 mg via ORAL

## 2013-01-15 NOTE — Patient Instructions (Signed)
[facility] Discharge Instructions for Patients Receiving Chemotherapy  Today you received the following chemotherapy agents Rituxan, Velcade, cytoxan  To help prevent nausea and vomiting after your treatment, we encourage you to take your nausea medication.   If you develop nausea and vomiting that is not controlled by your nausea medication, call the clinic. If it is after clinic hours your family physician or the after hours number for the clinic or go to the Emergency Department.   BELOW ARE SYMPTOMS THAT SHOULD BE REPORTED IMMEDIATELY:  *FEVER GREATER THAN 100.5 F  *CHILLS WITH OR WITHOUT FEVER  NAUSEA AND VOMITING THAT IS NOT CONTROLLED WITH YOUR NAUSEA MEDICATION  *UNUSUAL SHORTNESS OF BREATH  *UNUSUAL BRUISING OR BLEEDING  TENDERNESS IN MOUTH AND THROAT WITH OR WITHOUT PRESENCE OF ULCERS  *URINARY PROBLEMS  *BOWEL PROBLEMS  UNUSUAL RASH Items with * indicate a potential emergency and should be followed up as soon as possible.  One of the nurses will contact you 24 hours after your treatment. Please let the nurse know about any problems that you may have experienced. Feel free to call the clinic you have any questions or concerns. The clinic phone number is [phone].   I have been informed and understand all the instructions given to me. I know to contact the clinic, my physician, or go to the Emergency Department if any problems should occur. I do not have any questions at this time, but understand that I may call the clinic during office hours   should I have any questions or need assistance in obtaining follow up care.    __________________________________________  _____________  __________ Signature of Patient or Authorized Representative            Date                   Time    __________________________________________ Nurse's Signature

## 2013-01-27 ENCOUNTER — Other Ambulatory Visit: Payer: Self-pay | Admitting: Cardiology

## 2013-01-27 NOTE — Telephone Encounter (Signed)
..   Requested Prescriptions   Pending Prescriptions Disp Refills  . atenolol (TENORMIN) 50 MG tablet [Pharmacy Med Name: ATENOLOL 50MG        TAB] 30 tablet 6    Sig: TAKE ONE TABLET BY MOUTH EVERY DAY

## 2013-01-29 ENCOUNTER — Encounter: Payer: Self-pay | Admitting: Oncology

## 2013-01-29 ENCOUNTER — Ambulatory Visit (HOSPITAL_BASED_OUTPATIENT_CLINIC_OR_DEPARTMENT_OTHER): Payer: Medicare Other

## 2013-01-29 ENCOUNTER — Telehealth: Payer: Self-pay | Admitting: Oncology

## 2013-01-29 ENCOUNTER — Telehealth: Payer: Self-pay | Admitting: *Deleted

## 2013-01-29 ENCOUNTER — Other Ambulatory Visit (HOSPITAL_BASED_OUTPATIENT_CLINIC_OR_DEPARTMENT_OTHER): Payer: Medicare Other | Admitting: Lab

## 2013-01-29 ENCOUNTER — Ambulatory Visit (HOSPITAL_BASED_OUTPATIENT_CLINIC_OR_DEPARTMENT_OTHER): Payer: Medicare Other | Admitting: Oncology

## 2013-01-29 VITALS — BP 147/78 | HR 58 | Temp 96.8°F | Resp 18 | Ht 67.0 in | Wt 205.4 lb

## 2013-01-29 DIAGNOSIS — Z5112 Encounter for antineoplastic immunotherapy: Secondary | ICD-10-CM

## 2013-01-29 DIAGNOSIS — C8313 Mantle cell lymphoma, intra-abdominal lymph nodes: Secondary | ICD-10-CM

## 2013-01-29 DIAGNOSIS — Z5111 Encounter for antineoplastic chemotherapy: Secondary | ICD-10-CM

## 2013-01-29 LAB — COMPREHENSIVE METABOLIC PANEL (CC13)
ALT: 14 U/L (ref 0–55)
AST: 23 U/L (ref 5–34)
Albumin: 3.6 g/dL (ref 3.5–5.0)
Alkaline Phosphatase: 76 U/L (ref 40–150)
Potassium: 4.4 mEq/L (ref 3.5–5.1)
Sodium: 141 mEq/L (ref 136–145)
Total Bilirubin: 0.88 mg/dL (ref 0.20–1.20)
Total Protein: 6.2 g/dL — ABNORMAL LOW (ref 6.4–8.3)

## 2013-01-29 LAB — CBC WITH DIFFERENTIAL/PLATELET
BASO%: 0.7 % (ref 0.0–2.0)
HCT: 37.2 % — ABNORMAL LOW (ref 38.4–49.9)
LYMPH%: 29.4 % (ref 14.0–49.0)
MCH: 33.4 pg (ref 27.2–33.4)
MCHC: 33.1 g/dL (ref 32.0–36.0)
MCV: 101.1 fL — ABNORMAL HIGH (ref 79.3–98.0)
MONO#: 0.7 10*3/uL (ref 0.1–0.9)
MONO%: 16.4 % — ABNORMAL HIGH (ref 0.0–14.0)
NEUT%: 45.6 % (ref 39.0–75.0)
Platelets: 197 10*3/uL (ref 140–400)
RBC: 3.68 10*6/uL — ABNORMAL LOW (ref 4.20–5.82)
WBC: 4.5 10*3/uL (ref 4.0–10.3)
nRBC: 0 % (ref 0–0)

## 2013-01-29 MED ORDER — DEXAMETHASONE SODIUM PHOSPHATE 4 MG/ML IJ SOLN
20.0000 mg | Freq: Once | INTRAMUSCULAR | Status: AC
  Administered 2013-01-29: 20 mg via INTRAVENOUS

## 2013-01-29 MED ORDER — ONDANSETRON 8 MG/50ML IVPB (CHCC)
8.0000 mg | Freq: Once | INTRAVENOUS | Status: AC
  Administered 2013-01-29: 8 mg via INTRAVENOUS

## 2013-01-29 MED ORDER — SODIUM CHLORIDE 0.9 % IV SOLN
Freq: Once | INTRAVENOUS | Status: AC
  Administered 2013-01-29: 12:00:00 via INTRAVENOUS

## 2013-01-29 MED ORDER — SODIUM CHLORIDE 0.9 % IJ SOLN
10.0000 mL | INTRAMUSCULAR | Status: DC | PRN
  Administered 2013-01-29: 10 mL
  Filled 2013-01-29: qty 10

## 2013-01-29 MED ORDER — HEPARIN SOD (PORK) LOCK FLUSH 100 UNIT/ML IV SOLN
500.0000 [IU] | Freq: Once | INTRAVENOUS | Status: AC | PRN
  Administered 2013-01-29: 500 [IU]
  Filled 2013-01-29: qty 5

## 2013-01-29 MED ORDER — BORTEZOMIB CHEMO SQ INJECTION 3.5 MG (2.5MG/ML)
1.3000 mg/m2 | Freq: Once | INTRAMUSCULAR | Status: AC
  Administered 2013-01-29: 2.75 mg via SUBCUTANEOUS
  Filled 2013-01-29: qty 2.75

## 2013-01-29 MED ORDER — SODIUM CHLORIDE 0.9 % IV SOLN
190.0000 mg/m2 | Freq: Once | INTRAVENOUS | Status: AC
  Administered 2013-01-29: 400 mg via INTRAVENOUS
  Filled 2013-01-29: qty 20

## 2013-01-29 NOTE — Progress Notes (Signed)
This office note has been dictated.  #[id number]

## 2013-01-29 NOTE — Telephone Encounter (Signed)
Gave pt appt for lab and for June , July and August 2014

## 2013-01-29 NOTE — Telephone Encounter (Signed)
Per staff phone call and POF I have schedueld appts.  JMW  

## 2013-01-29 NOTE — Patient Instructions (Addendum)
[facility] Discharge Instructions for Patients Receiving Chemotherapy  Today you received the following chemotherapy agents: cytoxan, velcade  To help prevent nausea and vomiting after your treatment, we encourage you to take your nausea medication.  Take it as often as prescribed.     If you develop nausea and vomiting that is not controlled by your nausea medication, call the clinic. If it is after clinic hours your family physician or the after hours number for the clinic or go to the Emergency Department.   BELOW ARE SYMPTOMS THAT SHOULD BE REPORTED IMMEDIATELY:  *FEVER GREATER THAN 100.5 F  *CHILLS WITH OR WITHOUT FEVER  NAUSEA AND VOMITING THAT IS NOT CONTROLLED WITH YOUR NAUSEA MEDICATION  *UNUSUAL SHORTNESS OF BREATH  *UNUSUAL BRUISING OR BLEEDING  TENDERNESS IN MOUTH AND THROAT WITH OR WITHOUT PRESENCE OF ULCERS  *URINARY PROBLEMS  *BOWEL PROBLEMS  UNUSUAL RASH Items with * indicate a potential emergency and should be followed up as soon as possible.  Feel free to call the clinic you have any questions or concerns. The clinic phone number is [phone].   I have been informed and understand all the instructions given to me. I know to contact the clinic, my physician, or go to the Emergency Department if any problems should occur. I do not have any questions at this time, but understand that I may call the clinic during office hours   should I have any questions or need assistance in obtaining follow up care.    __________________________________________  _____________  __________ Signature of Patient or Authorized Representative            Date                   Time    __________________________________________ Nurse's Signature

## 2013-01-30 NOTE — Progress Notes (Signed)
CC:   [name], M.D.  PROBLEM LIST:  1. Mantle cell lymphoma diagnosed in March 2008 with positive bone  marrow initially on 12/03/2006 and then negative bone marrow after  treatment in October 2009. As stated, the patient presented with  obstructive liver abnormalities requiring ERCP and non metal stent  placement by Dr. Melvia Heaps. The stent was subsequently removed  in October 2008. The patient was treated with 8 cycles of Rituxan,  Cytoxan, vincristine and Decadron in combination with Neulasta from  12/19/2006 through 05/15/2007. The patient then received  maintenance Rituxan from July 16, 2007 through February 01, 2010.  While on Rituxan, he had a recurrence of his disease by CT scan  carried out on 01/24/2010. Mr. Fabro then received treatment  with bendamustine and Rituxan in combination with Neulasta for 6  cycles from 02/19/2010 through 07/26/2010. We have a prior PET scan from  08/22/2010 and CT scans of chest, abdomen, and pelvis from 01/01/2011 that showed no evidence of disease. He had been off treatment since December 2011 and had been doing well without any symptoms until the CT scan of the abdomen and pelvis on 07/01/2011 showed signs of recurrence. CT scans of abdomen and pelvis with IVC on 09/02/11 showed further progression.  Rituxan, subcutaneous Velcade, intravenous Cytoxan and Decadron  were initiated on 10/15/2011. CT scans of the abdomen and pelvis carried out on 01/08/2012 showed a near complete response to therapy. CT scan of the abdomen and pelvis with IV contrast carried out on 06/19/2012 showed stable plaque-like soft tissue density in the upper abdominal retroperitoneum compared with the CT scan of 01/08/2012. CT scan of the abdomen and pelvis with IV contrast carried out on 11/30/2012 showed no evidence of recurrent lymphoma.  2. Coronary artery disease, S/P 2 MIs, most recently 03/21/10. Heart cath on 03/22/10. LVEF is 40-50%.  3. Peripheral vascular  disease.  4. Dyslipidemia.  5. Hypertension.  6. History of depression.  7. History of kidney stones.  8. History of sleep apnea.  9. History of colonic polyps.  10. 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 Kathlee Nations Trigt, status post radiation treatments.  11. Easy bruising.  12. Diverticulosis seen on CT scans done 06/19/2012.  13. Right Port-A-Cath placed on 11/20/2011.    MEDICATIONS: Current Outpatient Prescriptions  Medication Sig Dispense Refill  . acyclovir (ZOVIRAX) 400 MG tablet TAKE ONE TABLET BY MOUTH TWICE DAILY  60 tablet  1  . amLODipine (NORVASC) 2.5 MG tablet Take 2.5 mg by mouth daily.        Marland Kitchen aspirin 81 MG tablet Take 81 mg by mouth every other day.      Marland Kitchen atenolol (TENORMIN) 50 MG tablet TAKE ONE TABLET BY MOUTH EVERY DAY  30 tablet  6  . citalopram (CELEXA) 20 MG tablet TAKE ONE TABLET BY MOUTH ONCE DAILY  30 tablet  0  . clopidogrel (PLAVIX) 75 MG tablet TAKE ONE TABLET BY MOUTH EVERY DAY  30 tablet  10  . furosemide (LASIX) 20 MG tablet Take 20 mg by mouth daily as needed. For fluid.      . isosorbide mononitrate (IMDUR) 120 MG 24 hr tablet TAKE ONE TABLET BY MOUTH EVERY DAY  30 tablet  11  . lidocaine-prilocaine (EMLA) cream Apply to port 1 hour before treatment. Cover site with plastic wrap.  30 g  11  . naproxen sodium (ANAPROX) 220 MG tablet Take 440 mg by mouth 2 (  two) times daily as needed. For pain.      . nitroGLYCERIN (NITROSTAT) 0.4 MG SL tablet Place 1 tablet (0.4 mg total) under the tongue every 5 (five) minutes as needed. For chest pain.  25 tablet  prn  . pravastatin (PRAVACHOL) 40 MG tablet TAKE TWO TABLETS BY MOUTH AT BEDTIME  60 tablet  5  . PRESCRIPTION MEDICATION Inject into the vein once a week. Velcade and Cytoxan.      . sulfamethoxazole-trimethoprim (BACTRIM DS) 800-160 MG per tablet TAKE ONE TABLET BY MOUTH EVERY DAY AS DIRECTED. TAKE ONE TABLET ON MONDAY, WEDNESDAY, AND FRIDAY.  24 tablet  0   No  current facility-administered medications for this visit.   Facility-Administered Medications Ordered in Other Visits  Medication Dose Route Frequency Provider Last Rate Last Dose  . sodium chloride 0.9 % injection 10 mL  10 mL Intracatheter PRN Samul Dada, MD   10 mL at 08/03/12 1419    TREATMENT PROGRAM:  As of 12/04/2012, treatment program will be as follows:  -Velcade 2.75 mg subcu.  -Cytoxan 400 mg IV.  -Decadron 20 mg IV.  The above drugs will be given every 2 weeks.  -Rituxan 800 mg IV every 4 weeks.   Previously, beginning on 10/15/2011, Velcade, Cytoxan, and Decadron were  being administered weekly, and Rituxan was being administered every 3  weeks.    IMMUNIZATIONS:  Pneumovax was administered 09/06/2011.  Flu shot was administered in early December 2012 and on 05/13/2012.    SMOKING HISTORY: The patient has never smoked cigarettes.     HISTORY:  I saw Korbin Mapps today for followup of his relapsed mantle cell lymphoma with original diagnosis going back to March 2008. Mr. Ardis was accompanied by his wife, [name].  He was last seen by [place] on 12/04/2012.  We have been treating [name] every 2 weeks with chemotherapy.  He has been receiving Rituxan every 4 weeks.  Treatments most recently were given on 12/04/2012, 12/18/2012, 01/01/2013, 01/15/2013 and will be given today.  Mr. Rappaport received Rituxan on 04/25 and 05/23.  He is actually feeling better now that we have gone to an every 2 week schedule.  He has more energy, is much more active around the house and yard.  He is completely without symptoms or side effects from the treatments.  He has had no other health problems and he is doing phenomenally well.  No suggestion of recurrence at this time.  PHYSICAL EXAMINATION:  The patient looks well.  Weight is 205 pounds 6.4 ounces.  Height 5 feet 7 inches.  Body surface area 2.1 sq m.  Blood pressure 147/78.  Other vital signs are normal.  There is  no scleral icterus.  Mouth and pharynx are benign.  There is no peripheral adenopathy palpable.  Heart and lungs are normal.  There is a right- sided Port-A-Cath.  Abdomen obese, nontender with no organomegaly or masses palpable.  There is no longer any induration in the area above the umbilicus were he had recurrent tumor.  The diastasis recti is present.  Extremities continue to show 3 to 4+ edema of both lower legs, very tight with minimal pitting.  Neurologic exam is normal.  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  LABORATORY DATA:  From today, white count 4.5, ANC 2.0, hemoglobin 12.3, hematocrit 37.2, platelets 197,000.  Chemistries today are pending. Chemistries from 12/04/2012 were normal except for an  albumin of 3.4 and an LDH of 246.   IMAGING STUDIES:  1. CT scan of chest, abdomen and pelvis with IV contrast on 01/01/2011  were negative for evidence of recurrent lymphoma.  2. PET scan from 08/22/2010 showed no evidence for recurrent lymphoma.  3. CT scan of chest, abdomen and pelvis with IV contrast on 01/01/2011  showed no evidence for lymphoma.  4. CT scan of abdomen and pelvis on 07/01/2011 showed interval  development of peritoneal disease worrisome for recurrence of  lymphoma. There was new right external iliac lymph node  pathologically enlarged and worrisome for recurrent lymphoma.  There was some stable appearance of mild soft tissue stranding  surrounding the celiac trunk and superior mesenteric artery.  5. Chest x-ray, 2 view, from 09/02/2011 showed no acute findings.  6. CT scan of abdomen and pelvis with IV contrast on 09/02/2011 showed  interval progression of lymphadenopathy in the abdomen and pelvis.  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 The 1.1 x 1.7 cm omental nodule  which was measured on the prior study of 07/01/2011 now measures  4.1 x 2.4 cm. A 2nd discrete soft tissue nodule in the omentum  which previously  measured 1.1 x 1.5 cm now measures 2.2 x 3.0 cm.  7. CT scan of abdomen and pelvis with IV contrast on 01/08/2012 showed no residual measurable mass along the anterior aspect of the right diaphragm at the  site of the previously demonstrated 4.9 x 2.1 cm nodule. There is no  residual measurable gastrohepatic or celiac lymphadenopathy. There is  some soft tissue stranding around the proximal abdominal aorta, celiac  trunk and superior mesenteric artery as noted on images 27-34. Pelvic  lymphadenopathy is nearly completely resolved. A right external iliac  node measuring 6 mm on image 66 previously measured 9 mm. Another node  which previously measured 17 mm short axis now measures 8 mm on image  70. There is no residual inguinal adenopathy. The spleen remains  normal in size, demonstrates no focal abnormalities. There are no  suspect osseous findings. There is no residual mass at the left  posterior costophrenic angle. The final impression was near complete  response to therapy in the lymphadenopathy and omental disease with some  minimal residual measurable disease as previously noted. There was an  enlarged left inguinal hernia containing a knuckle of sigmoid colon with  no evidence for incarceration or bowel obstruction. There was a  nonobstructing left renal calculus.  8. CT scan of abdomen and pelvis with IV contrast carried out on 06/19/2012  was compared with the scan of 01/08/2012 and showed stable plaque-like soft tissue density in the upper abdominal retroperitoneum, which had the appearance of treated lymphoma. No new or progressive disease within the abdomen or pelvis  was noted. There is a stable incidental finding of a small left  inguinal hernia and diverticulosis.  9. Chest x-ray, 2-view, from 09/07/2012 showed low lung volumes with  streaky basilar atelectasis.  10. CT scan of abdomen and pelvis with IV contrast from 11/30/2012 compared  with the prior CT scans from  06/19/2012 showed no significant changes  and no evidence of recurrent lymphoma or other acute findings.    IMPRESSION AND PLAN:  Mr. Heidelberg continues to do extremely well with no evidence for lymphoma.  It will be recalled that his last CT scan of abdomen and pelvis was carried out on 11/30/2012.  Now that we have gone to treatment every 2 weeks he seems to have more energy.  My plan is to continue the current program for a while.  Today he will receive the 3 drugs, Velcade 2.75 mg subcu, Cytoxan 400 mg IV, Decadron 20 mg IV.  I should mention that the patient is not having any urinary symptomatology or hematuria from the Cytoxan.  The patient will receive Rituxan along with those 3 drugs in 2 weeks which will be June 20.  Next treatment will be scheduled for July 3 as July 4 falls on Friday.  Rituxan will be given on 07/18 and again on 08/15 along with the 3 drugs.  The patient receives Rituxan 800 mg IV.  He does not have any reactions to that.  Thus we will continue the treatment as we have been doing, every 2 weeks with Rituxan every 4 weeks.  I have asked Mr. Dobratz to return on August 15, at which time he will have CBC and chemistries.  He will be due for all 4 drugs on that date.  I should also mention that he will be having a CBC prior to every chemotherapy.  Given the patient's history of a couple of recurrences and the fact that he has mantle cell lymphoma my inclination would be to continue some sort of ongoing treatment.    ______________________________ Samul Dada, M.D. DSM/MEDQ  D:  01/29/2013  T:  01/30/2013  Job:  [id number]

## 2013-02-08 ENCOUNTER — Telehealth: Payer: Self-pay | Admitting: Oncology

## 2013-02-08 NOTE — Telephone Encounter (Signed)
Talked to pt and gave her appt for June,July 2014

## 2013-02-09 ENCOUNTER — Other Ambulatory Visit: Payer: Self-pay | Admitting: Oncology

## 2013-02-09 DIAGNOSIS — C8313 Mantle cell lymphoma, intra-abdominal lymph nodes: Secondary | ICD-10-CM

## 2013-02-11 ENCOUNTER — Other Ambulatory Visit: Payer: Self-pay

## 2013-02-11 MED ORDER — PRAVASTATIN SODIUM 40 MG PO TABS: ORAL_TABLET | ORAL | Status: DC

## 2013-02-12 ENCOUNTER — Other Ambulatory Visit (HOSPITAL_BASED_OUTPATIENT_CLINIC_OR_DEPARTMENT_OTHER): Payer: Medicare Other | Admitting: Lab

## 2013-02-12 ENCOUNTER — Telehealth: Payer: Self-pay | Admitting: Oncology

## 2013-02-12 ENCOUNTER — Ambulatory Visit (HOSPITAL_BASED_OUTPATIENT_CLINIC_OR_DEPARTMENT_OTHER): Payer: Medicare Other

## 2013-02-12 VITALS — BP 125/69 | HR 53 | Temp 97.0°F | Resp 18

## 2013-02-12 DIAGNOSIS — Z5111 Encounter for antineoplastic chemotherapy: Secondary | ICD-10-CM

## 2013-02-12 DIAGNOSIS — Z5112 Encounter for antineoplastic immunotherapy: Secondary | ICD-10-CM

## 2013-02-12 DIAGNOSIS — C8313 Mantle cell lymphoma, intra-abdominal lymph nodes: Secondary | ICD-10-CM

## 2013-02-12 LAB — CBC WITH DIFFERENTIAL/PLATELET
Basophils Absolute: 0 10*3/uL (ref 0.0–0.1)
HCT: 37.2 % — ABNORMAL LOW (ref 38.4–49.9)
HGB: 12.5 g/dL — ABNORMAL LOW (ref 13.0–17.1)
LYMPH%: 19.9 % (ref 14.0–49.0)
MONO#: 0.7 10*3/uL (ref 0.1–0.9)
NEUT%: 52.1 % (ref 39.0–75.0)
Platelets: 181 10*3/uL (ref 140–400)
WBC: 3.7 10*3/uL — ABNORMAL LOW (ref 4.0–10.3)
lymph#: 0.7 10*3/uL — ABNORMAL LOW (ref 0.9–3.3)

## 2013-02-12 MED ORDER — SODIUM CHLORIDE 0.9 % IV SOLN
375.0000 mg/m2 | Freq: Once | INTRAVENOUS | Status: AC
  Administered 2013-02-12: 800 mg via INTRAVENOUS
  Filled 2013-02-12: qty 80

## 2013-02-12 MED ORDER — DIPHENHYDRAMINE HCL 25 MG PO CAPS
25.0000 mg | ORAL_CAPSULE | Freq: Once | ORAL | Status: AC
  Administered 2013-02-12: 25 mg via ORAL

## 2013-02-12 MED ORDER — BORTEZOMIB CHEMO SQ INJECTION 3.5 MG (2.5MG/ML)
1.3000 mg/m2 | Freq: Once | INTRAMUSCULAR | Status: AC
  Administered 2013-02-12: 2.75 mg via SUBCUTANEOUS
  Filled 2013-02-12: qty 2.75

## 2013-02-12 MED ORDER — DEXAMETHASONE SODIUM PHOSPHATE 4 MG/ML IJ SOLN
20.0000 mg | Freq: Once | INTRAMUSCULAR | Status: AC
  Administered 2013-02-12: 20 mg via INTRAVENOUS

## 2013-02-12 MED ORDER — SODIUM CHLORIDE 0.9 % IV SOLN
Freq: Once | INTRAVENOUS | Status: AC
  Administered 2013-02-12: 11:00:00 via INTRAVENOUS

## 2013-02-12 MED ORDER — SODIUM CHLORIDE 0.9 % IJ SOLN
10.0000 mL | INTRAMUSCULAR | Status: DC | PRN
  Administered 2013-02-12: 10 mL
  Filled 2013-02-12: qty 10

## 2013-02-12 MED ORDER — SODIUM CHLORIDE 0.9 % IV SOLN: Freq: Once | INTRAVENOUS | Status: DC

## 2013-02-12 MED ORDER — ONDANSETRON 8 MG/50ML IVPB (CHCC)
8.0000 mg | Freq: Once | INTRAVENOUS | Status: AC
  Administered 2013-02-12: 8 mg via INTRAVENOUS

## 2013-02-12 MED ORDER — SODIUM CHLORIDE 0.9 % IV SOLN
190.0000 mg/m2 | Freq: Once | INTRAVENOUS | Status: AC
  Administered 2013-02-12: 400 mg via INTRAVENOUS
  Filled 2013-02-12: qty 20

## 2013-02-12 MED ORDER — ACETAMINOPHEN 325 MG PO TABS
650.0000 mg | ORAL_TABLET | Freq: Once | ORAL | Status: AC
  Administered 2013-02-12: 650 mg via ORAL

## 2013-02-12 MED ORDER — HEPARIN SOD (PORK) LOCK FLUSH 100 UNIT/ML IV SOLN
500.0000 [IU] | Freq: Once | INTRAVENOUS | Status: AC | PRN
  Administered 2013-02-12: 500 [IU]
  Filled 2013-02-12: qty 5

## 2013-02-12 NOTE — Telephone Encounter (Signed)
gave pt appt for chemo for september 2014

## 2013-02-12 NOTE — Patient Instructions (Addendum)
[facility] Discharge Instructions for Patients Receiving Chemotherapy  Today you received the following chemotherapy agents rituxan, cytoxan and velcade.  To help prevent nausea and vomiting after your treatment, we encourage you to take your nausea medication   If you develop nausea and vomiting that is not controlled by your nausea medication, call the clinic.   BELOW ARE SYMPTOMS THAT SHOULD BE REPORTED IMMEDIATELY:  *FEVER GREATER THAN 100.5 F  *CHILLS WITH OR WITHOUT FEVER  NAUSEA AND VOMITING THAT IS NOT CONTROLLED WITH YOUR NAUSEA MEDICATION  *UNUSUAL SHORTNESS OF BREATH  *UNUSUAL BRUISING OR BLEEDING  TENDERNESS IN MOUTH AND THROAT WITH OR WITHOUT PRESENCE OF ULCERS  *URINARY PROBLEMS  *BOWEL PROBLEMS  UNUSUAL RASH Items with * indicate a potential emergency and should be followed up as soon as possible.  Feel free to call the clinic you have any questions or concerns. The clinic phone number is [phone].

## 2013-02-19 ENCOUNTER — Other Ambulatory Visit: Payer: Medicare Other | Admitting: Lab

## 2013-02-25 ENCOUNTER — Other Ambulatory Visit (HOSPITAL_BASED_OUTPATIENT_CLINIC_OR_DEPARTMENT_OTHER): Payer: Medicare Other | Admitting: Lab

## 2013-02-25 ENCOUNTER — Ambulatory Visit (HOSPITAL_BASED_OUTPATIENT_CLINIC_OR_DEPARTMENT_OTHER): Payer: Medicare Other

## 2013-02-25 VITALS — BP 134/72 | HR 75 | Temp 97.3°F | Resp 18

## 2013-02-25 DIAGNOSIS — C8313 Mantle cell lymphoma, intra-abdominal lymph nodes: Secondary | ICD-10-CM

## 2013-02-25 DIAGNOSIS — Z5112 Encounter for antineoplastic immunotherapy: Secondary | ICD-10-CM

## 2013-02-25 LAB — MANUAL DIFFERENTIAL
Band Neutrophils: 2 % (ref 0–10)
Basophil: 1 % (ref 0–2)
EOS: 3 % (ref 0–7)
LYMPH: 22 % (ref 14–49)
Other Cell: 0 % (ref 0–0)
PLT EST: ADEQUATE
nRBC: 0 % (ref 0–0)

## 2013-02-25 LAB — CBC WITH DIFFERENTIAL/PLATELET
MCV: 100.8 fL — ABNORMAL HIGH (ref 79.3–98.0)
Platelets: 185 10*3/uL (ref 140–400)
RBC: 3.84 10*6/uL — ABNORMAL LOW (ref 4.20–5.82)
WBC: 4.6 10*3/uL (ref 4.0–10.3)

## 2013-02-25 MED ORDER — BORTEZOMIB CHEMO SQ INJECTION 3.5 MG (2.5MG/ML)
1.3000 mg/m2 | Freq: Once | INTRAMUSCULAR | Status: AC
  Administered 2013-02-25: 2.75 mg via SUBCUTANEOUS
  Filled 2013-02-25: qty 2.75

## 2013-02-25 MED ORDER — HEPARIN SOD (PORK) LOCK FLUSH 100 UNIT/ML IV SOLN
500.0000 [IU] | Freq: Once | INTRAVENOUS | Status: AC | PRN
  Administered 2013-02-25: 500 [IU]
  Filled 2013-02-25: qty 5

## 2013-02-25 MED ORDER — SODIUM CHLORIDE 0.9 % IV SOLN
Freq: Once | INTRAVENOUS | Status: AC
  Administered 2013-02-25: 11:00:00 via INTRAVENOUS

## 2013-02-25 MED ORDER — ONDANSETRON 8 MG/50ML IVPB (CHCC)
8.0000 mg | Freq: Once | INTRAVENOUS | Status: AC
  Administered 2013-02-25: 8 mg via INTRAVENOUS

## 2013-02-25 MED ORDER — DEXAMETHASONE SODIUM PHOSPHATE 4 MG/ML IJ SOLN
20.0000 mg | Freq: Once | INTRAMUSCULAR | Status: AC
  Administered 2013-02-25: 20 mg via INTRAVENOUS

## 2013-02-25 MED ORDER — SODIUM CHLORIDE 0.9 % IV SOLN
190.0000 mg/m2 | Freq: Once | INTRAVENOUS | Status: AC
  Administered 2013-02-25: 400 mg via INTRAVENOUS
  Filled 2013-02-25: qty 20

## 2013-02-25 MED ORDER — SODIUM CHLORIDE 0.9 % IJ SOLN
10.0000 mL | INTRAMUSCULAR | Status: DC | PRN
  Administered 2013-02-25: 10 mL
  Filled 2013-02-25: qty 10

## 2013-02-25 NOTE — Patient Instructions (Addendum)
[facility] Discharge Instructions for Patients Receiving Chemotherapy  Today you received the following chemotherapy agents Cytoxan/Velcade To help prevent nausea and vomiting after your treatment, we encourage you to take your nausea medication as prescribed.  If you develop nausea and vomiting that is not controlled by your nausea medication, call the clinic.   BELOW ARE SYMPTOMS THAT SHOULD BE REPORTED IMMEDIATELY:  *FEVER GREATER THAN 100.5 F  *CHILLS WITH OR WITHOUT FEVER  NAUSEA AND VOMITING THAT IS NOT CONTROLLED WITH YOUR NAUSEA MEDICATION  *UNUSUAL SHORTNESS OF BREATH  *UNUSUAL BRUISING OR BLEEDING  TENDERNESS IN MOUTH AND THROAT WITH OR WITHOUT PRESENCE OF ULCERS  *URINARY PROBLEMS  *BOWEL PROBLEMS  UNUSUAL RASH Items with * indicate a potential emergency and should be followed up as soon as possible.  Feel free to call the clinic you have any questions or concerns. The clinic phone number is [phone].    

## 2013-03-02 ENCOUNTER — Other Ambulatory Visit: Payer: Self-pay | Admitting: Oncology

## 2013-03-02 DIAGNOSIS — C8589 Other specified types of non-Hodgkin lymphoma, extranodal and solid organ sites: Secondary | ICD-10-CM

## 2013-03-09 ENCOUNTER — Other Ambulatory Visit: Payer: Self-pay | Admitting: Oncology

## 2013-03-12 ENCOUNTER — Other Ambulatory Visit (HOSPITAL_BASED_OUTPATIENT_CLINIC_OR_DEPARTMENT_OTHER): Payer: Medicare Other | Admitting: Lab

## 2013-03-12 ENCOUNTER — Ambulatory Visit (HOSPITAL_BASED_OUTPATIENT_CLINIC_OR_DEPARTMENT_OTHER): Payer: Medicare Other

## 2013-03-12 VITALS — BP 135/74 | HR 55 | Temp 98.0°F | Resp 18

## 2013-03-12 DIAGNOSIS — Z5112 Encounter for antineoplastic immunotherapy: Secondary | ICD-10-CM

## 2013-03-12 DIAGNOSIS — C8313 Mantle cell lymphoma, intra-abdominal lymph nodes: Secondary | ICD-10-CM

## 2013-03-12 DIAGNOSIS — Z5111 Encounter for antineoplastic chemotherapy: Secondary | ICD-10-CM

## 2013-03-12 LAB — CBC WITH DIFFERENTIAL/PLATELET
BASO%: 1 % (ref 0.0–2.0)
EOS%: 8.4 % — ABNORMAL HIGH (ref 0.0–7.0)
HCT: 36.1 % — ABNORMAL LOW (ref 38.4–49.9)
MCH: 33.5 pg — ABNORMAL HIGH (ref 27.2–33.4)
MCHC: 33 g/dL (ref 32.0–36.0)
MONO#: 0.9 10*3/uL (ref 0.1–0.9)
NEUT%: 43.6 % (ref 39.0–75.0)
RBC: 3.55 10*6/uL — ABNORMAL LOW (ref 4.20–5.82)
RDW: 13.5 % (ref 11.0–14.6)
WBC: 5.1 10*3/uL (ref 4.0–10.3)
lymph#: 1.5 10*3/uL (ref 0.9–3.3)
nRBC: 0 % (ref 0–0)

## 2013-03-12 MED ORDER — BORTEZOMIB CHEMO SQ INJECTION 3.5 MG (2.5MG/ML)
1.3000 mg/m2 | Freq: Once | INTRAMUSCULAR | Status: AC
  Administered 2013-03-12: 2.75 mg via SUBCUTANEOUS
  Filled 2013-03-12: qty 2.75

## 2013-03-12 MED ORDER — SODIUM CHLORIDE 0.9 % IV SOLN
Freq: Once | INTRAVENOUS | Status: AC
  Administered 2013-03-12: 10:00:00 via INTRAVENOUS

## 2013-03-12 MED ORDER — DEXAMETHASONE SODIUM PHOSPHATE 4 MG/ML IJ SOLN
20.0000 mg | Freq: Once | INTRAMUSCULAR | Status: AC
  Administered 2013-03-12: 20 mg via INTRAVENOUS

## 2013-03-12 MED ORDER — SODIUM CHLORIDE 0.9 % IJ SOLN
10.0000 mL | INTRAMUSCULAR | Status: DC | PRN
  Administered 2013-03-12: 10 mL
  Filled 2013-03-12: qty 10

## 2013-03-12 MED ORDER — HEPARIN SOD (PORK) LOCK FLUSH 100 UNIT/ML IV SOLN
500.0000 [IU] | Freq: Once | INTRAVENOUS | Status: AC | PRN
  Administered 2013-03-12: 500 [IU]
  Filled 2013-03-12: qty 5

## 2013-03-12 MED ORDER — ACETAMINOPHEN 325 MG PO TABS
650.0000 mg | ORAL_TABLET | Freq: Once | ORAL | Status: AC
  Administered 2013-03-12: 650 mg via ORAL

## 2013-03-12 MED ORDER — DIPHENHYDRAMINE HCL 25 MG PO CAPS
25.0000 mg | ORAL_CAPSULE | Freq: Once | ORAL | Status: AC
  Administered 2013-03-12: 25 mg via ORAL

## 2013-03-12 MED ORDER — ONDANSETRON 8 MG/50ML IVPB (CHCC)
8.0000 mg | Freq: Once | INTRAVENOUS | Status: AC
  Administered 2013-03-12: 8 mg via INTRAVENOUS

## 2013-03-12 MED ORDER — SODIUM CHLORIDE 0.9 % IV SOLN
190.0000 mg/m2 | Freq: Once | INTRAVENOUS | Status: AC
  Administered 2013-03-12: 400 mg via INTRAVENOUS
  Filled 2013-03-12: qty 20

## 2013-03-12 MED ORDER — SODIUM CHLORIDE 0.9 % IV SOLN
375.0000 mg/m2 | Freq: Once | INTRAVENOUS | Status: AC
  Administered 2013-03-12: 800 mg via INTRAVENOUS
  Filled 2013-03-12: qty 80

## 2013-03-12 NOTE — Patient Instructions (Signed)
[facility] Discharge Instructions for Patients Receiving Chemotherapy  Today you received the following chemotherapy agents: velcade, cytoxan, rituxan  To help prevent nausea and vomiting after your treatment, we encourage you to take your nausea medication.  Take it as often as prescribed.     If you develop nausea and vomiting that is not controlled by your nausea medication, call the clinic. If it is after clinic hours your family physician or the after hours number for the clinic or go to the Emergency Department.   BELOW ARE SYMPTOMS THAT SHOULD BE REPORTED IMMEDIATELY:  *FEVER GREATER THAN 100.5 F  *CHILLS WITH OR WITHOUT FEVER  NAUSEA AND VOMITING THAT IS NOT CONTROLLED WITH YOUR NAUSEA MEDICATION  *UNUSUAL SHORTNESS OF BREATH  *UNUSUAL BRUISING OR BLEEDING  TENDERNESS IN MOUTH AND THROAT WITH OR WITHOUT PRESENCE OF ULCERS  *URINARY PROBLEMS  *BOWEL PROBLEMS  UNUSUAL RASH Items with * indicate a potential emergency and should be followed up as soon as possible.  Feel free to call the clinic you have any questions or concerns. The clinic phone number is [phone].   I have been informed and understand all the instructions given to me. I know to contact the clinic, my physician, or go to the Emergency Department if any problems should occur. I do not have any questions at this time, but understand that I may call the clinic during office hours   should I have any questions or need assistance in obtaining follow up care.    __________________________________________  _____________  __________ Signature of Patient or Authorized Representative            Date                   Time    __________________________________________ Nurse's Signature

## 2013-03-26 ENCOUNTER — Ambulatory Visit (HOSPITAL_BASED_OUTPATIENT_CLINIC_OR_DEPARTMENT_OTHER): Payer: Medicare Other

## 2013-03-26 ENCOUNTER — Other Ambulatory Visit (HOSPITAL_BASED_OUTPATIENT_CLINIC_OR_DEPARTMENT_OTHER): Payer: Medicare Other | Admitting: Lab

## 2013-03-26 VITALS — BP 176/88 | HR 64 | Temp 97.5°F | Resp 18

## 2013-03-26 DIAGNOSIS — C8313 Mantle cell lymphoma, intra-abdominal lymph nodes: Secondary | ICD-10-CM

## 2013-03-26 DIAGNOSIS — I739 Peripheral vascular disease, unspecified: Secondary | ICD-10-CM

## 2013-03-26 DIAGNOSIS — L089 Local infection of the skin and subcutaneous tissue, unspecified: Secondary | ICD-10-CM

## 2013-03-26 DIAGNOSIS — Z5112 Encounter for antineoplastic immunotherapy: Secondary | ICD-10-CM

## 2013-03-26 DIAGNOSIS — Z5111 Encounter for antineoplastic chemotherapy: Secondary | ICD-10-CM

## 2013-03-26 LAB — CBC WITH DIFFERENTIAL/PLATELET
Basophils Absolute: 0 10*3/uL (ref 0.0–0.1)
Eosinophils Absolute: 0.5 10*3/uL (ref 0.0–0.5)
HCT: 34.1 % — ABNORMAL LOW (ref 38.4–49.9)
LYMPH%: 29.6 % (ref 14.0–49.0)
MCV: 101.5 fL — ABNORMAL HIGH (ref 79.3–98.0)
MONO#: 0.8 10*3/uL (ref 0.1–0.9)
MONO%: 18.5 % — ABNORMAL HIGH (ref 0.0–14.0)
NEUT#: 1.8 10*3/uL (ref 1.5–6.5)
NEUT%: 40.3 % (ref 39.0–75.0)
Platelets: 196 10*3/uL (ref 140–400)
RBC: 3.36 10*6/uL — ABNORMAL LOW (ref 4.20–5.82)
WBC: 4.4 10*3/uL (ref 4.0–10.3)

## 2013-03-26 MED ORDER — BORTEZOMIB CHEMO SQ INJECTION 3.5 MG (2.5MG/ML)
1.3000 mg/m2 | Freq: Once | INTRAMUSCULAR | Status: AC
  Administered 2013-03-26: 2.75 mg via SUBCUTANEOUS
  Filled 2013-03-26: qty 2.75

## 2013-03-26 MED ORDER — HEPARIN SOD (PORK) LOCK FLUSH 100 UNIT/ML IV SOLN
500.0000 [IU] | Freq: Once | INTRAVENOUS | Status: AC | PRN
  Administered 2013-03-26: 500 [IU]
  Filled 2013-03-26: qty 5

## 2013-03-26 MED ORDER — SODIUM CHLORIDE 0.9 % IV SOLN
Freq: Once | INTRAVENOUS | Status: AC
  Administered 2013-03-26: 10:00:00 via INTRAVENOUS

## 2013-03-26 MED ORDER — ONDANSETRON 8 MG/50ML IVPB (CHCC)
8.0000 mg | Freq: Once | INTRAVENOUS | Status: AC
  Administered 2013-03-26: 8 mg via INTRAVENOUS

## 2013-03-26 MED ORDER — DEXAMETHASONE SODIUM PHOSPHATE 4 MG/ML IJ SOLN
20.0000 mg | Freq: Once | INTRAMUSCULAR | Status: AC
  Administered 2013-03-26: 20 mg via INTRAVENOUS

## 2013-03-26 MED ORDER — SODIUM CHLORIDE 0.9 % IV SOLN
190.0000 mg/m2 | Freq: Once | INTRAVENOUS | Status: AC
  Administered 2013-03-26: 400 mg via INTRAVENOUS
  Filled 2013-03-26: qty 20

## 2013-03-26 MED ORDER — SODIUM CHLORIDE 0.9 % IJ SOLN
10.0000 mL | INTRAMUSCULAR | Status: DC | PRN
  Administered 2013-03-26: 10 mL
  Filled 2013-03-26: qty 10

## 2013-03-26 NOTE — Patient Instructions (Addendum)
[facility] Discharge Instructions for Patients Receiving Chemotherapy  Today you received the following chemotherapy agents Cytoxan,Velcade To help prevent nausea and vomiting after your treatment, we encourage you to take your nausea medication  If you develop nausea and vomiting that is not controlled by your nausea medication, call the clinic.   BELOW ARE SYMPTOMS THAT SHOULD BE REPORTED IMMEDIATELY:  *FEVER GREATER THAN 100.5 F  *CHILLS WITH OR WITHOUT FEVER  NAUSEA AND VOMITING THAT IS NOT CONTROLLED WITH YOUR NAUSEA MEDICATION  *UNUSUAL SHORTNESS OF BREATH  *UNUSUAL BRUISING OR BLEEDING  TENDERNESS IN MOUTH AND THROAT WITH OR WITHOUT PRESENCE OF ULCERS  *URINARY PROBLEMS  *BOWEL PROBLEMS  UNUSUAL RASH Items with * indicate a potential emergency and should be followed up as soon as possible.  Feel free to call the clinic you have any questions or concerns. The clinic phone number is [phone].

## 2013-04-02 ENCOUNTER — Telehealth: Payer: Self-pay | Admitting: Hematology and Oncology

## 2013-04-02 NOTE — Telephone Encounter (Signed)
CP1 OUT 8/13 TO 9/2. MOVED 8/14 APPT TO [facility]. PT HAS TX - NO CHANGE IN START TIME.

## 2013-04-08 ENCOUNTER — Ambulatory Visit (HOSPITAL_BASED_OUTPATIENT_CLINIC_OR_DEPARTMENT_OTHER): Payer: Medicare Other | Admitting: Physician Assistant

## 2013-04-08 ENCOUNTER — Other Ambulatory Visit (HOSPITAL_BASED_OUTPATIENT_CLINIC_OR_DEPARTMENT_OTHER): Payer: Medicare Other | Admitting: Lab

## 2013-04-08 ENCOUNTER — Encounter (HOSPITAL_COMMUNITY): Payer: Self-pay | Admitting: *Deleted

## 2013-04-08 ENCOUNTER — Observation Stay (HOSPITAL_COMMUNITY)
Admission: EM | Admit: 2013-04-08 | Discharge: 2013-04-09 | Disposition: A | Discharge status: Home / Self Care | Payer: Medicare Other | Attending: Cardiology | Admitting: Cardiology

## 2013-04-08 ENCOUNTER — Emergency Department (HOSPITAL_COMMUNITY): Payer: Medicare Other

## 2013-04-08 ENCOUNTER — Other Ambulatory Visit: Payer: Self-pay

## 2013-04-08 VITALS — BP 179/84 | HR 66 | Temp 97.5°F | Resp 18 | Ht 67.0 in | Wt 207.7 lb

## 2013-04-08 DIAGNOSIS — C8313 Mantle cell lymphoma, intra-abdominal lymph nodes: Secondary | ICD-10-CM

## 2013-04-08 DIAGNOSIS — M129 Arthropathy, unspecified: Secondary | ICD-10-CM | POA: Insufficient documentation

## 2013-04-08 DIAGNOSIS — R0789 Other chest pain: Secondary | ICD-10-CM

## 2013-04-08 DIAGNOSIS — C8318 Mantle cell lymphoma, lymph nodes of multiple sites: Secondary | ICD-10-CM | POA: Diagnosis present

## 2013-04-08 DIAGNOSIS — I739 Peripheral vascular disease, unspecified: Secondary | ICD-10-CM | POA: Insufficient documentation

## 2013-04-08 DIAGNOSIS — G4730 Sleep apnea, unspecified: Secondary | ICD-10-CM | POA: Insufficient documentation

## 2013-04-08 DIAGNOSIS — R072 Precordial pain: Principal | ICD-10-CM | POA: Insufficient documentation

## 2013-04-08 DIAGNOSIS — Z79899 Other long term (current) drug therapy: Secondary | ICD-10-CM | POA: Insufficient documentation

## 2013-04-08 DIAGNOSIS — R079 Chest pain, unspecified: Secondary | ICD-10-CM

## 2013-04-08 DIAGNOSIS — Z951 Presence of aortocoronary bypass graft: Secondary | ICD-10-CM | POA: Insufficient documentation

## 2013-04-08 DIAGNOSIS — I10 Essential (primary) hypertension: Secondary | ICD-10-CM | POA: Diagnosis present

## 2013-04-08 DIAGNOSIS — N400 Enlarged prostate without lower urinary tract symptoms: Secondary | ICD-10-CM | POA: Insufficient documentation

## 2013-04-08 DIAGNOSIS — I2510 Atherosclerotic heart disease of native coronary artery without angina pectoris: Secondary | ICD-10-CM

## 2013-04-08 DIAGNOSIS — I872 Venous insufficiency (chronic) (peripheral): Secondary | ICD-10-CM | POA: Insufficient documentation

## 2013-04-08 DIAGNOSIS — E785 Hyperlipidemia, unspecified: Secondary | ICD-10-CM | POA: Insufficient documentation

## 2013-04-08 DIAGNOSIS — R609 Edema, unspecified: Secondary | ICD-10-CM | POA: Insufficient documentation

## 2013-04-08 DIAGNOSIS — E669 Obesity, unspecified: Secondary | ICD-10-CM | POA: Insufficient documentation

## 2013-04-08 LAB — CBC WITH DIFFERENTIAL/PLATELET
BASO%: 0.5 % (ref 0.0–2.0)
EOS%: 7.4 % — ABNORMAL HIGH (ref 0.0–7.0)
HCT: 35.9 % — ABNORMAL LOW (ref 38.4–49.9)
LYMPH%: 16.3 % (ref 14.0–49.0)
MCH: 33.4 pg (ref 27.2–33.4)
MCHC: 33.1 g/dL (ref 32.0–36.0)
NEUT%: 62.5 % (ref 39.0–75.0)
lymph#: 0.7 10*3/uL — ABNORMAL LOW (ref 0.9–3.3)

## 2013-04-08 LAB — COMPREHENSIVE METABOLIC PANEL (CC13)
Alkaline Phosphatase: 77 U/L (ref 40–150)
BUN: 17.4 mg/dL (ref 7.0–26.0)
Glucose: 87 mg/dl (ref 70–140)
Sodium: 146 mEq/L — ABNORMAL HIGH (ref 136–145)
Total Bilirubin: 0.64 mg/dL (ref 0.20–1.20)

## 2013-04-08 LAB — TROPONIN I: Troponin I: <0.3 ng/mL (ref <0.30)

## 2013-04-08 LAB — CBC
HCT: 30.9 % — ABNORMAL LOW (ref 39.0–52.0)
HCT: 34.4 % — ABNORMAL LOW (ref 39.0–52.0)
MCHC: 34 g/dL (ref 30.0–36.0)
MCV: 100 fL (ref 78.0–100.0)
MCV: 101.2 fL — ABNORMAL HIGH (ref 78.0–100.0)
RBC: 3.4 MIL/uL — ABNORMAL LOW (ref 4.22–5.81)
RDW: 13.3 % (ref 11.5–15.5)
RDW: 13.3 % (ref 11.5–15.5)
WBC: 4.1 10*3/uL (ref 4.0–10.5)
WBC: 3.8 10*3/uL — ABNORMAL LOW (ref 4.0–10.5)

## 2013-04-08 LAB — BASIC METABOLIC PANEL
BUN: 18 mg/dL (ref 6–23)
CO2: 26 mEq/L (ref 19–32)
Chloride: 107 mEq/L (ref 96–112)
Creatinine, Ser: 1.27 mg/dL (ref 0.50–1.35)

## 2013-04-08 LAB — CREATININE, SERUM: GFR calc Af Amer: 60 mL/min — ABNORMAL LOW (ref >90)

## 2013-04-08 MED ORDER — AMLODIPINE BESYLATE 2.5 MG PO TABS
2.5000 mg | ORAL_TABLET | Freq: Every day | ORAL | Status: DC
  Filled 2013-04-08: qty 1

## 2013-04-08 MED ORDER — NAPROXEN SODIUM 275 MG PO TABS: 440.0000 mg | ORAL_TABLET | Freq: Two times a day (BID) | ORAL | Status: DC | PRN

## 2013-04-08 MED ORDER — CITALOPRAM HYDROBROMIDE 20 MG PO TABS
20.0000 mg | ORAL_TABLET | Freq: Every day | ORAL | Status: DC
  Administered 2013-04-09: 20 mg via ORAL
  Filled 2013-04-08: qty 1

## 2013-04-08 MED ORDER — SULFAMETHOXAZOLE-TMP DS 800-160 MG PO TABS
1.0000 | ORAL_TABLET | ORAL | Status: DC
Start: 2013-04-09 — End: 2013-04-09
  Administered 2013-04-09: 1 via ORAL
  Filled 2013-04-08: qty 1

## 2013-04-08 MED ORDER — ACYCLOVIR 400 MG PO TABS
400.0000 mg | ORAL_TABLET | Freq: Two times a day (BID) | ORAL | Status: DC
  Administered 2013-04-09: 400 mg via ORAL
  Filled 2013-04-08 (×3): qty 1

## 2013-04-08 MED ORDER — CLOPIDOGREL BISULFATE 75 MG PO TABS
75.0000 mg | ORAL_TABLET | Freq: Every day | ORAL | Status: DC
  Administered 2013-04-09: 75 mg via ORAL
  Filled 2013-04-08: qty 1

## 2013-04-08 MED ORDER — NAPROXEN 375 MG PO TABS
375.0000 mg | ORAL_TABLET | Freq: Two times a day (BID) | ORAL | Status: DC | PRN
  Filled 2013-04-08: qty 1

## 2013-04-08 MED ORDER — NITROGLYCERIN 0.4 MG SL SUBL: 0.4000 mg | SUBLINGUAL_TABLET | SUBLINGUAL | Status: DC | PRN

## 2013-04-08 MED ORDER — ATENOLOL 50 MG PO TABS
50.0000 mg | ORAL_TABLET | Freq: Every day | ORAL | Status: DC
  Administered 2013-04-09: 50 mg via ORAL
  Filled 2013-04-08: qty 1

## 2013-04-08 MED ORDER — ACETAMINOPHEN 325 MG PO TABS: 650.0000 mg | ORAL_TABLET | ORAL | Status: DC | PRN

## 2013-04-08 MED ORDER — FUROSEMIDE 20 MG PO TABS
20.0000 mg | ORAL_TABLET | Freq: Every day | ORAL | Status: DC | PRN
  Filled 2013-04-08: qty 1

## 2013-04-08 MED ORDER — SIMVASTATIN 20 MG PO TABS
20.0000 mg | ORAL_TABLET | Freq: Every day | ORAL | Status: DC
  Administered 2013-04-09: 20 mg via ORAL
  Filled 2013-04-08: qty 1

## 2013-04-08 MED ORDER — GI COCKTAIL ~~LOC~~: 30.0000 mL | Freq: Four times a day (QID) | ORAL | Status: DC | PRN

## 2013-04-08 MED ORDER — ISOSORBIDE MONONITRATE ER 60 MG PO TB24
120.0000 mg | ORAL_TABLET | Freq: Every day | ORAL | Status: DC
  Administered 2013-04-09: 120 mg via ORAL
  Filled 2013-04-08: qty 2

## 2013-04-08 MED ORDER — HEPARIN SODIUM (PORCINE) 5000 UNIT/ML IJ SOLN
5000.0000 [IU] | Freq: Three times a day (TID) | INTRAMUSCULAR | Status: DC
  Administered 2013-04-09: 5000 [IU] via SUBCUTANEOUS
  Filled 2013-04-08 (×5): qty 1

## 2013-04-08 MED ORDER — ALPRAZOLAM 0.25 MG PO TABS: 0.2500 mg | ORAL_TABLET | Freq: Two times a day (BID) | ORAL | Status: DC | PRN

## 2013-04-08 MED ORDER — ONDANSETRON HCL 4 MG/2ML IJ SOLN
4.0000 mg | Freq: Four times a day (QID) | INTRAMUSCULAR | Status: DC | PRN
  Administered 2013-04-09: 4 mg via INTRAVENOUS
  Filled 2013-04-08: qty 2

## 2013-04-08 MED ORDER — ASPIRIN 81 MG PO CHEW
81.0000 mg | CHEWABLE_TABLET | Freq: Every day | ORAL | Status: DC
  Administered 2013-04-09: 81 mg via ORAL
  Filled 2013-04-08: qty 1

## 2013-04-08 NOTE — ED Notes (Addendum)
Cardiology MD, Dr. Patty Sermons at the bedside.

## 2013-04-08 NOTE — H&P (Signed)
Martin Morris is an 79 y.o. male.   Primary cardiologist: Dr. Antoine Poche. Chief Complaint: Chest pain HPI: Pleasant 79 year old gentleman is sent here from the oncology outpatient unit because of chest pain.  The patient has known ischemic heart disease.  He is followed by Dr. Antoine Poche who last saw him on 10/28/2012.  That time he was having infrequent chest pain and his isosorbide mononitrate was increased from 60 mg up to 120 mg daily with improvement.  Patient had coronary artery bypass graft surgery by Dr. Zenaida Niece trigt in 2003.  He had cardiac catheterization in 2011 showing a patent LIMA to the LAD and previously known occlusion of the saphenous vein graft to the diagonal and sequential to obtuse marginals.  There was severe diffuse native obtuse marginal and diagonal branch vessel disease with preserved ejection fraction.  The patient has a history of essential hypertension and hyperlipidemia.  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  He received radiation therapy.  He subsequently has been diagnosed with mantle cell lymphoma and has been on chemotherapy  since 2003. Yesterday the patient had chest tightness with radiation to the back occurring at rest.  This morning after eating breakfast at [employer] he developed discomfort of a pressure-like nature in his anterior chest and across her shoulders and down both arms and in his back.  He became nauseated.  He left the restaurant and was driving to the cancer Center.  He had to pull over in the [employer] parking lot as he was afraid that he would vomit.  The discomfort gradually subsided as did the nausea and he went on to the cancer Center.  When he told the nurse there about his symptoms he was advised to come to the emergency room for further evaluation.  His wife states that he has been having more frequent and more severe episodes of chest discomfort recently.  He did not try any sublingual  nitroglycerin for the recent attacks.  Past Medical History  Diagnosis Date  . thymus ca dx'd 2003    Radiation comp 2003  . NHL (non-Hodgkin's lymphoma) dx'd 2008    Chemo comp 10/2010; rituxin comp 10/2010  . Hypertension   . CAD 11/12/2007     (Cath 7/11 Patent LIMA to the LAD. Previously known occlusion of a saphenous vein graft to diagonal and    sequential to obtuse marginals. Severe diffuse native obtuse marginal and diagonal branch vessel disease.    Preserved ejection fraction.)  . HYPERLIPIDEMIA 11/12/2007  . HYPERTENSION 11/12/2007  . PERIPHERAL VASCULAR DISEASE 11/12/2007  . ARTHRITIS 11/12/2007  . BENIGN PROSTATIC HYPERTROPHY, HX OF 11/12/2007  . 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 Edema 10/17/2010  . INGUINAL HERNIAS, BILATERAL 11/10/2006  . MANTLE CELL LYMPHOMA INTRA-ABDOMINAL LYMPH NODES 11/12/2007  . Obesity, unspecified 07/05/2009  . SLEEP APNEA 11/12/2007  . History of PTCA 2005  . Osteoarthritis   . Carotid stenosis, bilateral   . Nephrolithiasis     Past Surgical History  Procedure Laterality Date  . Coronary artery bypass graft    . Cardiac catheterization  08/2009, 02/2010    Family History  Problem Relation Age of Onset  . Coronary artery disease Mother     died at 23  . Heart attack Sister     died at age 29  . Heart attack Brother    Social History:  reports that he has never smoked. He has never used smokeless  tobacco. He reports that he does not drink alcohol or use illicit drugs.  Allergies:  Allergies  Allergen Reactions  . Atorvastatin     Incredible dizziness     (Not in a hospital admission)  Results for orders placed during the hospital encounter of 04/08/13 (from the past 48 hour(s))  BASIC METABOLIC PANEL     Status: Abnormal   Collection Time    04/08/13  2:40 PM      Result Value Range   Sodium 142  135 - 145 mEq/L   Potassium 3.8  3.5 - 5.1 mEq/L   Chloride 107  96 - 112 mEq/L   CO2 26  19 - 32 mEq/L   Glucose, Bld 120 (*) 70 - 99 mg/dL    BUN 18  6 - 23 mg/dL   Creatinine, Ser 1.61  0.50 - 1.35 mg/dL   Calcium 8.8  8.4 - 09.6 mg/dL   GFR calc non Af Amer 52 (*) >90 mL/min   GFR calc Af Amer 61 (*) >90 mL/min   Comment: (NOTE)     The eGFR has been calculated using the CKD EPI equation.     This calculation has not been validated in all clinical situations.     eGFR's persistently <90 mL/min signify possible Chronic Kidney     Disease.  CBC     Status: Abnormal   Collection Time    04/08/13  2:40 PM      Result Value Range   WBC 4.1  4.0 - 10.5 K/uL   RBC 3.09 (*) 4.22 - 5.81 MIL/uL   Hemoglobin 10.5 (*) 13.0 - 17.0 g/dL   HCT 04.5 (*) 40.9 - 81.1 %   MCV 100.0  78.0 - 100.0 fL   MCH 34.0  26.0 - 34.0 pg   MCHC 34.0  30.0 - 36.0 g/dL   RDW 91.4  78.2 - 95.6 %   Platelets 207  150 - 400 K/uL  POCT I-STAT TROPONIN I     Status: None   Collection Time    04/08/13  2:45 PM      Result Value Range   Troponin i, poc 0.04  0.00 - 0.08 ng/mL   Comment 3            Comment: Due to the release kinetics of cTnI,     a negative result within the first hours     of the onset of symptoms does not rule out     myocardial infarction with certainty.     If myocardial infarction is still suspected,     repeat the test at appropriate intervals.   Dg Chest 2 View  04/08/2013   *RADIOLOGY REPORT*  Clinical Data: Back and chest pain.  History of lymphoma. Nonsmoker.  Hypertension.  CHEST - 2 VIEW  Comparison: 09/07/2012  Findings: Mild hyperinflation.  Accentuation of expected thoracic kyphosis with mild lower thoracic spondylosis.  Median sternotomy for CABG.  Right-sided Port-A-Cath terminates at the mid SVC.  Midline trachea.  Mild cardiomegaly with atherosclerosis in the transverse aorta. No pleural effusion or pneumothorax.  Mild nonspecific interstitial thickening.  Mild scarring at the left lung base.  IMPRESSION: No acute cardiopulmonary disease.  Cardiomegaly with prior median sternotomy.   Original Report Authenticated By:  Jeronimo Greaves, M.D.    Review of systems reveals chronic bilateral edema worse on the right side which is where his veins were harvested for his bypass graft.  The patient has a history of an  enlarged prostate with difficulty urinating and does not like to take Lasix.  Blood pressure 149/75, pulse 57, temperature 98.6 F (37 C), temperature source Oral, resp. rate 21, SpO2 96.00%. The patient appears to be in no distress.  He is lying flat comfortably.  Head and neck exam reveals that the pupils are equal and reactive.  The extraocular movements are full.  There is no scleral icterus.  Mouth and pharynx are benign.  No lymphadenopathy.  No carotid bruits.  The jugular venous pressure is normal.  Thyroid is not enlarged or tender.  Chest is clear to percussion and auscultation.  No rales or rhonchi.  Expansion of the chest is symmetrical.  There is a Port-A-Cath in the right upper chest.  Heart reveals no abnormal lift or heave.  First and second heart sounds are normal.  There is no murmur gallop rub or click.  The abdomen is soft and nontender.  Bowel sounds are normoactive.  There is no hepatosplenomegaly or mass.  There are no abdominal bruits.  Extremities reveal bilateral soft pitting edema worse on the right.  Pedal pulses are present.  Neurologic exam is normal strength and no lateralizing weakness.  No sensory deficits.  Integument reveals no rash  EKG today shows normal sinus rhythm with lateral T-wave flattening.  Assessment/Plan 1. chest pain.  Status post CABG 2003. 2. Mantle cell lymphoma 3. essential hypertension 4. Hyperlipidemia 5. venous insufficiency with severe chronic bilateral edema  Plan: Admit to observation telemetry.  Serial cardiac enzymes.  Lexa scan Myoview in a.m. if enzymes remain negative.   also update 2-D echo--his last echo was in 2008 and showed normal LV function at that time.  Cassell Clement 04/08/2013, 5:20 PM

## 2013-04-08 NOTE — ED Notes (Signed)
To ED for eval of 'spells' of upper back pain/chest pain. States when his back hurts, his arms then feel weak. Pt was at oncology today and had one of these episodes. Told to come to ED for further eval.

## 2013-04-08 NOTE — Patient Instructions (Addendum)
Your being sent to the [facility] emergency room for further evaluation and management of your atypical pain thought related to your history of coronary artery disease Your chemotherapy is being postponed for 4 weeks while your cardiac status is evaluated and managed Followup in 1 month provided you've been cleared by cardiology to resume your chemotherapy. We'll also schedule him for a restaging CT of the abdomen and pelvis with contrast to reevaluate her disease to be discussed when you return in one month.

## 2013-04-08 NOTE — ED Notes (Signed)
Pt stated he has a port in place.  Spoke with RN who approved waiting on labs to be drawn from port.  Pt compliant and aware of plan of care

## 2013-04-08 NOTE — ED Provider Notes (Signed)
CSN: [id number]     Arrival date & time 04/08/13  1257 History     First MD Initiated Contact with Patient 04/08/13 1426     Chief Complaint  Patient presents with  . Back Pain  . Chest Pain   (Consider location/radiation/quality/duration/timing/severity/associated sxs/prior Treatment) HPI Comments: 79 year old white male presents emergency department with chief complaint of chest pain. Patient was sent to the emergency department by hematology oncology. Patient states that this morning around 8:30 AM he developed chest pain while eating breakfast. At oncology had chest pain again until the provider there. Per the patient and his spouse cardiology was consulted and the patient was sent to emergency department. At time of interview the patient was pain-free.   He also had chest pain last night. He has not taken nitroglycerin or other medications.  Patient is a 79 y.o. male presenting with back pain and chest pain. The history is provided by the patient and the spouse.  Back Pain Location:  Generalized Quality:  Aching Radiates to:  Does not radiate Pain severity:  No pain Onset quality:  Gradual Progression:  Worsening Chronicity:  Recurrent Context: not emotional stress, not falling and not lifting heavy objects   Associated symptoms: chest pain   Chest pain:    Quality:  Aching   Severity:  Moderate   Onset quality:  Gradual   Duration:  2 days   Timing:  Intermittent   Progression:  Waxing and waning   Chronicity:  Recurrent Risk factors: hx of cancer and obesity   Risk factors: no hx of osteoporosis and no steroid use   Chest Pain Associated symptoms: back pain   Associated symptoms: no cough and no palpitations     Past Medical History  Diagnosis Date  . thymus ca dx'd 2003    Radiation comp 2003  . NHL (non-Hodgkin's lymphoma) dx'd 2008    Chemo comp 10/2010; rituxin comp 10/2010  . Hypertension   . CAD 11/12/2007     (Cath 7/11 Patent LIMA to the LAD. Previously  known occlusion of a saphenous vein graft to diagonal and    sequential to obtuse marginals. Severe diffuse native obtuse marginal and diagonal branch vessel disease.    Preserved ejection fraction.)  . HYPERLIPIDEMIA 11/12/2007  . HYPERTENSION 11/12/2007  . PERIPHERAL VASCULAR DISEASE 11/12/2007  . ARTHRITIS 11/12/2007  . BENIGN PROSTATIC HYPERTROPHY, HX OF 11/12/2007  . 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 Edema 10/17/2010  . INGUINAL HERNIAS, BILATERAL 11/10/2006  . MANTLE CELL LYMPHOMA INTRA-ABDOMINAL LYMPH NODES 11/12/2007  . Obesity, unspecified 07/05/2009  . SLEEP APNEA 11/12/2007  . History of PTCA 2005  . Osteoarthritis   . Carotid stenosis, bilateral   . Nephrolithiasis    Past Surgical History  Procedure Laterality Date  . Coronary artery bypass graft    . Cardiac catheterization  08/2009, 02/2010   Family History  Problem Relation Age of Onset  . Coronary artery disease Mother     died at 72  . Heart attack Sister     died at age 25  . Heart attack Brother    History  Substance Use Topics  . Smoking status: Never Smoker   . Smokeless tobacco: Never Used  . Alcohol Use: No    Review of Systems  Constitutional: Negative for chills, activity change and appetite change.  HENT: Negative.   Respiratory: Positive for chest tightness. Negative for apnea, cough and choking.   Cardiovascular: Positive for chest pain and leg swelling. Negative for palpitations.  Musculoskeletal: Positive for back pain.  Neurological: Negative.   Hematological:       Patient has a history of lymphoma for which he is being treated. He gets chemotherapy every other week    Allergies  Atorvastatin  Home Medications   Current Outpatient Rx  Name  Route  Sig  Dispense  Refill  . acyclovir (ZOVIRAX) 400 MG tablet   Oral   Take 400 mg by mouth 2 (two) times daily.         Marland Kitchen amLODipine (NORVASC) 2.5 MG tablet   Oral   Take 2.5 mg by mouth daily.           Marland Kitchen aspirin 81 MG tablet   Oral   Take  81 mg by mouth every other day.         Marland Kitchen atenolol (TENORMIN) 50 MG tablet   Oral   Take 50 mg by mouth daily.         . citalopram (CELEXA) 20 MG tablet   Oral   Take 20 mg by mouth daily.         . clopidogrel (PLAVIX) 75 MG tablet   Oral   Take 75 mg by mouth daily.         . furosemide (LASIX) 20 MG tablet   Oral   Take 20 mg by mouth daily as needed. For fluid.         . isosorbide mononitrate (IMDUR) 120 MG 24 hr tablet   Oral   Take 120 mg by mouth daily.         Marland Kitchen lidocaine-prilocaine (EMLA) cream   Topical   Apply 1 application topically See admin instructions. Apply to port 1 hour before treatment. Cover site with plastic wrap         . naproxen sodium (ANAPROX) 220 MG tablet   Oral   Take 440 mg by mouth 2 (two) times daily as needed. For pain.         . nitroGLYCERIN (NITROSTAT) 0.4 MG SL tablet   Sublingual   Place 0.4 mg under the tongue every 5 (five) minutes as needed for chest pain.         . pravastatin (PRAVACHOL) 40 MG tablet   Oral   Take 80 mg by mouth at bedtime.         Marland Kitchen PRESCRIPTION MEDICATION   Intravenous   Inject into the vein every 14 (fourteen) days. Velcade and Cytoxan.         . sulfamethoxazole-trimethoprim (BACTRIM DS) 800-160 MG per tablet   Oral   Take 1 tablet by mouth every Monday, Wednesday, and Friday.          BP 133/78  Pulse 64  Temp(Src) 98.6 F (37 C) (Oral)  Resp 16  SpO2 93% Physical Exam  Constitutional: He is oriented to person, place, and time. He appears well-developed and well-nourished.  HENT:  Head: Normocephalic and atraumatic.  Eyes: Conjunctivae and EOM are normal. Pupils are equal, round, and reactive to light.  Neck: Normal range of motion. Neck supple.  Cardiovascular: Normal rate and regular rhythm.  Exam reveals no gallop.   No murmur heard. IV port was placed in the right chest, but tenderness to palpation, symmetric rise and fall,  Pulmonary/Chest: Effort normal and  breath sounds normal.  Abdominal: Soft. Bowel sounds are normal. He exhibits no distension. There is no tenderness. There is no rebound and no guarding.  Musculoskeletal: Normal range of motion. He exhibits  edema.  Bilateral lower extremity edema right greater than left, pitting to mid shin  Neurological: He is alert and oriented to person, place, and time. He has normal reflexes.  Skin: Skin is warm and dry.    ED Course   Procedures (including critical care time)  Labs Reviewed  CBC - Abnormal; Notable for the following:    RBC 3.09 (*)    Hemoglobin 10.5 (*)    HCT 30.9 (*)    All other components within normal limits  BASIC METABOLIC PANEL  POCT I-STAT TROPONIN I   Dg Chest 2 View  04/08/2013   *RADIOLOGY REPORT*  Clinical Data: Back and chest pain.  History of lymphoma. Nonsmoker.  Hypertension.  CHEST - 2 VIEW  Comparison: 09/07/2012  Findings: Mild hyperinflation.  Accentuation of expected thoracic kyphosis with mild lower thoracic spondylosis.  Median sternotomy for CABG.  Right-sided Port-A-Cath terminates at the mid SVC.  Midline trachea.  Mild cardiomegaly with atherosclerosis in the transverse aorta. No pleural effusion or pneumothorax.  Mild nonspecific interstitial thickening.  Mild scarring at the left lung base.  IMPRESSION: No acute cardiopulmonary disease.  Cardiomegaly with prior median sternotomy.   Original Report Authenticated By: Jeronimo Greaves, M.D.   No diagnosis found.  Results for orders placed during the hospital encounter of 04/08/13  CBC      Result Value Range   WBC 4.1  4.0 - 10.5 K/uL   RBC 3.09 (*) 4.22 - 5.81 MIL/uL   Hemoglobin 10.5 (*) 13.0 - 17.0 g/dL   HCT 16.1 (*) 09.6 - 04.5 %   MCV 100.0  78.0 - 100.0 fL   MCH 34.0  26.0 - 34.0 pg   MCHC 34.0  30.0 - 36.0 g/dL   RDW 40.9  81.1 - 91.4 %   Platelets 207  150 - 400 K/uL  POCT I-STAT TROPONIN I      Result Value Range   Troponin i, poc 0.04  0.00 - 0.08 ng/mL   Comment 3             Date:  04/08/2013  Rate: 65  Rhythm: normal sinus rhythm  QRS Axis: normal  Intervals: normal  ST/T Wave abnormalities: nonspecific ST/T changes  Conduction Disutrbances:none  Narrative Interpretation:   Old EKG Reviewed: unchanged  Assessment: Chest Pain  MDM  79 year old white male presents emergency department with intermittent chest pain. Patient transferred to the ED from  hematology oncology clinic.  At 3:19 PM VSS, EKG neg for ischemia and trop negative.  Plan to continue to monitor and consult cardiology.    Will t/o pt to Dr. Dawna Part ER attending pending disposition at 4:01 PM   Darlys Gales, MD 04/08/13 1601

## 2013-04-08 NOTE — ED Notes (Signed)
Pt. Placed on Pod A 1 and now is transferred to X-ray.

## 2013-04-08 NOTE — Progress Notes (Addendum)
CC:   Martin Morris, M.D.  PROBLEM LIST:  1. Mantle cell lymphoma diagnosed in March 2008 with positive bone  marrow initially on 12/03/2006 and then negative bone marrow after  treatment in October 2009. As stated, the patient presented with  obstructive liver abnormalities requiring ERCP and non metal stent  placement by Dr. Melvia Heaps. The stent was subsequently removed  in October 2008. The patient was treated with 8 cycles of Rituxan,  Cytoxan, vincristine and Decadron in combination with Neulasta from  12/19/2006 through 05/15/2007. The patient then received  maintenance Rituxan from July 16, 2007 through February 01, 2010.  While on Rituxan, he had a recurrence of his disease by CT scan  carried out on 01/24/2010. Martin Morris then received treatment  with bendamustine and Rituxan in combination with Neulasta for 6  cycles from 02/19/2010 through 07/26/2010. We have a prior PET scan from  08/22/2010 and CT scans of chest, abdomen, and pelvis from 01/01/2011 that showed no evidence of disease. He had been off treatment since December 2011 and had been doing well without any symptoms until the CT scan of the abdomen and pelvis on 07/01/2011 showed signs of recurrence. CT scans of abdomen and pelvis with IVC on 09/02/11 showed further progression.  Rituxan, subcutaneous Velcade, intravenous Cytoxan and Decadron  were initiated on 10/15/2011. CT scans of the abdomen and pelvis carried out on 01/08/2012 showed a near complete response to therapy. CT scan of the abdomen and pelvis with IV contrast carried out on 06/19/2012 showed stable plaque-like soft tissue density in the upper abdominal retroperitoneum compared with the CT scan of 01/08/2012. CT scan of the abdomen and pelvis with IV contrast carried out on 11/30/2012 showed no evidence of recurrent lymphoma.  2. Coronary artery disease, S/P 2 MIs, most recently 03/21/10. Heart cath on 03/22/10. LVEF is 40-50%.  3. Peripheral vascular  disease.  4. Dyslipidemia.  5. Hypertension.  6. History of depression.  7. History of kidney stones.  8. History of sleep apnea.  9. History of colonic polyps.  10. 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 Kathlee Nations Trigt, status post radiation treatments.  11. Easy bruising.  12. Diverticulosis seen on CT scans done 06/19/2012.  13. Right Port-A-Cath placed on 11/20/2011.    MEDICATIONS: No current facility-administered medications for this visit.   Current Outpatient Prescriptions  Medication Sig Dispense Refill  . acyclovir (ZOVIRAX) 400 MG tablet Take 400 mg by mouth 2 (two) times daily.      Marland Kitchen amLODipine (NORVASC) 2.5 MG tablet Take 2.5 mg by mouth daily.        Marland Kitchen aspirin 81 MG tablet Take 81 mg by mouth every other day.      Marland Kitchen atenolol (TENORMIN) 50 MG tablet Take 50 mg by mouth daily.      . citalopram (CELEXA) 20 MG tablet Take 20 mg by mouth daily.      . clopidogrel (PLAVIX) 75 MG tablet Take 75 mg by mouth daily.      . furosemide (LASIX) 20 MG tablet Take 20 mg by mouth daily as needed. For fluid.      . isosorbide mononitrate (IMDUR) 120 MG 24 hr tablet Take 120 mg by mouth daily.      Marland Kitchen lidocaine-prilocaine (EMLA) cream Apply 1 application topically See admin instructions. Apply to port 1 hour before treatment. Cover site with plastic wrap      . naproxen sodium (ANAPROX) 220  MG tablet Take 440 mg by mouth 2 (two) times daily as needed. For pain.      . nitroGLYCERIN (NITROSTAT) 0.4 MG SL tablet Place 0.4 mg under the tongue every 5 (five) minutes as needed for chest pain.      . pravastatin (PRAVACHOL) 40 MG tablet Take 80 mg by mouth at bedtime.      Marland Kitchen PRESCRIPTION MEDICATION Inject into the vein every 14 (fourteen) days. Velcade and Cytoxan.      . sulfamethoxazole-trimethoprim (BACTRIM DS) 800-160 MG per tablet Take 1 tablet by mouth every Monday, Wednesday, and Friday.       Facility-Administered Medications Ordered in Other  Visits  Medication Dose Route Frequency Provider Last Rate Last Dose  . sodium chloride 0.9 % injection 10 mL  10 mL Intracatheter PRN Samul Dada, MD   10 mL at 08/03/12 1419    TREATMENT PROGRAM:  As of 12/04/2012, treatment program will be as follows:  -Velcade 2.75 mg subcu.  -Cytoxan 400 mg IV.  -Decadron 20 mg IV.  The above drugs will be given every 2 weeks.  -Rituxan 800 mg IV every 4 weeks.   Previously, beginning on 10/15/2011, Velcade, Cytoxan, and Decadron were  being administered weekly, and Rituxan was being administered every 3  weeks.    IMMUNIZATIONS:  Pneumovax was administered 09/06/2011.  Flu shot was administered in early December 2012 and on 05/13/2012.    SMOKING HISTORY: The patient has never smoked cigarettes.     HISTORY:  Patient is in today for a followup visit prior to proceeding with his next scheduled cycle of Rituxan, Velcade, Cytoxan and dexamethasone for treatment of his relapsed mantle cell lymphoma, originally diagnosed March 2008.Marland Kitchen Last restaging CT scans were done in April of 2014 and were negative for any evidence of disease recurrence.Overall he's tolerated his treatment without difficulty. His primary complaint today is that of pain between his shoulder blades that radiates into his neck into the anterior chest and down both arms. These episodes have been episodic but are becoming more frequent. Patient describes having his worst episode yet this morning when he was at her bees with his wife eating breakfast prior to coming to his appointment today. He states that episode was rather sharp and was also accompanied by nausea which is a new symptom related to these episodes of pain. He has a prior history of coronary artery disease and is status post 2 myocardial infarctions with the most recent one occurring 03/21/2010. He denies any diaphoresis associated with these pain episodes.  We have been treating Martin Morris every 2 weeks with  chemotherapy.  He has been receiving Rituxan every 4 weeks.  Treatments most recently given 01/29/2013 02/12/2013 02/25/2013 03/12/2013 and 03/26/2013. Rituxan was given 01/15/2013 02/12/2013 and 03/12/2013. He is due to have the Rituxan Velcade Cytoxan and dexamethasone on 04/09/2013.    PHYSICAL EXAMINATION:  The patient looks well.  Weight is 207 pounds 7 ounces.  Height 5 feet 7 inches.  Body surface area 2.1 sq m.  Blood pressure 179/84, pulse 66, respirations 18, O2 sat 97%.  There is no scleral icterus.  Mouth and pharynx are benign.  There is no peripheral adenopathy palpable.  Heart and lungs are normal.  There is a right- sided Port-A-Cath.  Abdomen obese, nontender with no organomegaly or masses palpable.   Extremities continue to show 3 to 4+ edema of both lower legs, right greater than left, very tight with minimal pitting.  Neurologic exam is  normal.     LABORATORY DATA:  From today, white count 4.1, ANC 2.5, hemoglobin 11.9, hematocrit 35.9, platelets 220,000.  Chemistries and LDH today are pending.  ECG was performed today and revealed normal sinus rhythm with nonspecific ST and T wave abnormalities. There is a prolonged QT interval at 84 ms was globally read as an abnormal ECG   IMAGING STUDIES:  1. CT scan of chest, abdomen and pelvis with IV contrast on 01/01/2011  were negative for evidence of recurrent lymphoma.  2. PET scan from 08/22/2010 showed no evidence for recurrent lymphoma.  3. CT scan of chest, abdomen and pelvis with IV contrast on 01/01/2011  showed no evidence for lymphoma.  4. CT scan of abdomen and pelvis on 07/01/2011 showed interval  development of peritoneal disease worrisome for recurrence of  lymphoma. There was new right external iliac lymph node  pathologically enlarged and worrisome for recurrent lymphoma.  There was some stable appearance of mild soft tissue stranding  surrounding the celiac trunk and superior mesenteric artery.  5. Chest  x-ray, 2 view, from 09/02/2011 showed no acute findings.  6. CT scan of abdomen and pelvis with IV contrast on 09/02/2011 showed  interval progression of lymphadenopathy in the abdomen and pelvis.  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 The 1.1 x 1.7 cm omental nodule  which was measured on the prior study of 07/01/2011 now measures  4.1 x 2.4 cm. A 2nd discrete soft tissue nodule in the omentum  which previously measured 1.1 x 1.5 cm now measures 2.2 x 3.0 cm.  7. CT scan of abdomen and pelvis with IV contrast on 01/08/2012 showed no residual measurable mass along the anterior aspect of the right diaphragm at the  site of the previously demonstrated 4.9 x 2.1 cm nodule. There is no  residual measurable gastrohepatic or celiac lymphadenopathy. There is  some soft tissue stranding around the proximal abdominal aorta, celiac  trunk and superior mesenteric artery as noted on images 27-34. Pelvic  lymphadenopathy is nearly completely resolved. A right external iliac  node measuring 6 mm on image 66 previously measured 9 mm. Another node  which previously measured 17 mm short axis now measures 8 mm on image  70. There is no residual inguinal adenopathy. The spleen remains  normal in size, demonstrates no focal abnormalities. There are no  suspect osseous findings. There is no residual mass at the left  posterior costophrenic angle. The final impression was near complete  response to therapy in the lymphadenopathy and omental disease with some  minimal residual measurable disease as previously noted. There was an  enlarged left inguinal hernia containing a knuckle of sigmoid colon with  no evidence for incarceration or bowel obstruction. There was a  nonobstructing left renal calculus.  8. CT scan of abdomen and pelvis with IV contrast carried out on 06/19/2012  was compared with the scan of 01/08/2012 and showed stable plaque-like soft tissue density in the upper abdominal retroperitoneum, which had the  appearance of treated lymphoma. No new or progressive disease within the abdomen or pelvis  was noted. There is a stable incidental finding of a small left  inguinal hernia and diverticulosis.  9. Chest x-ray, 2-view, from 09/07/2012 showed low lung volumes with  streaky basilar atelectasis.  10. CT scan of abdomen and pelvis with IV contrast from 11/30/2012 compared  with the prior CT scans from 06/19/2012 showed no significant changes  and no evidence of recurrent lymphoma or other acute findings.  IMPRESSION AND PLAN:  It will be recalled that his last CT scan of abdomen and pelvis was carried out on 11/30/2012. Overall he is tolerating his treatment without difficulty. The major concern is his atypical pain syndrome benign concern is related to his history of coronary artery disease. The patient was discussed with and seen by Dr. Welton Flakes and it was determined that we will postpone his chemotherapy until his cardiac status is evaluated and he is cleared to proceed with further treatment. He has previously been seen by Dr. Antoine Poche, last being seen in March of 2014. The patient resides in [facility] [place]. I personally called lobe our cardiology speaking with Dr. Myrtis Ser in his recommendation was to send the perch the patient to the [facility] emergency room for further evaluation and management. This was discussed with the patient his wife and both were in agreement. He is aware that his chemotherapy will be postponed for at least one month well his cardiac issues are being evaluated. Once he is been cleared from a cardiac standpoint,we will continue the treatment as we have been doing, every 2 weeks with Rituxan every 4 weeks.  We will reschedule his chemotherapy for all 4 drugs to be given 04/30/2013 pending cardiac clearance, with CBC differential, C. met and LDH drawn at that visit. Per Dr. urine isn't last note it was his opinion that given his history of a couple recurrences of this disease and the  fact that he has mantle cell lymphoma that we would be inclined to continue some sort of ongoing treatment.    Conni Slipper, PA-C  ATTENDING'S ATTESTATION:  I personally reviewed patient's chart, examined patient myself, formulated the treatment plan as followed.   Due to patient's cardiac and chest pains. I have recommended that we should postpone his chemotherapy. He does need to be seen by his cardiologist as soon as possible. He may also in fact be referred to the emergency room because of his ongoing chest pain. The end of my visit with them patient was recommended to [facility] for [facility] evaluation and management. They were both agreeable. In the meantime we will plan on rescheduling his chemotherapy. He will be seen by his primary oncologist.  Drue Second, MD Medical/Oncology [facility] [phone] (beeper) [phone] (Office)  05/04/2013, 6:34 PM

## 2013-04-09 ENCOUNTER — Observation Stay (HOSPITAL_COMMUNITY): Payer: Medicare Other

## 2013-04-09 ENCOUNTER — Encounter (HOSPITAL_COMMUNITY): Payer: Self-pay | Admitting: Nurse Practitioner

## 2013-04-09 ENCOUNTER — Ambulatory Visit: Payer: Medicare Other

## 2013-04-09 ENCOUNTER — Telehealth: Payer: Self-pay | Admitting: Oncology

## 2013-04-09 ENCOUNTER — Other Ambulatory Visit: Payer: Self-pay

## 2013-04-09 ENCOUNTER — Telehealth: Payer: Self-pay | Admitting: *Deleted

## 2013-04-09 DIAGNOSIS — R079 Chest pain, unspecified: Secondary | ICD-10-CM

## 2013-04-09 DIAGNOSIS — I369 Nonrheumatic tricuspid valve disorder, unspecified: Secondary | ICD-10-CM

## 2013-04-09 LAB — TROPONIN I
Troponin I: <0.3 ng/mL (ref <0.30)
Troponin I: <0.3 ng/mL (ref <0.30)

## 2013-04-09 MED ORDER — SODIUM CHLORIDE 0.9 % IJ SOLN
10.0000 mL | INTRAMUSCULAR | Status: DC | PRN
  Administered 2013-04-09 (×2): 10 mL

## 2013-04-09 MED ORDER — TECHNETIUM TC 99M SESTAMIBI GENERIC - CARDIOLITE
30.0000 | Freq: Once | INTRAVENOUS | Status: AC | PRN
  Administered 2013-04-09: 30 via INTRAVENOUS

## 2013-04-09 MED ORDER — AMLODIPINE BESYLATE 5 MG PO TABS: 5.0000 mg | ORAL_TABLET | Freq: Every day | ORAL | Status: DC

## 2013-04-09 MED ORDER — REGADENOSON 0.4 MG/5ML IV SOLN
INTRAVENOUS | Status: AC
  Administered 2013-04-09: 0.4 mg via INTRAVENOUS
  Filled 2013-04-09: qty 5

## 2013-04-09 MED ORDER — HEPARIN SOD (PORK) LOCK FLUSH 100 UNIT/ML IV SOLN
500.0000 [IU] | INTRAVENOUS | Status: DC
  Filled 2013-04-09: qty 5

## 2013-04-09 MED ORDER — AMLODIPINE BESYLATE 5 MG PO TABS
5.0000 mg | ORAL_TABLET | Freq: Every day | ORAL | Status: DC
  Administered 2013-04-09: 5 mg via ORAL
  Filled 2013-04-09: qty 1

## 2013-04-09 MED ORDER — TECHNETIUM TC 99M SESTAMIBI GENERIC - CARDIOLITE
10.0000 | Freq: Once | INTRAVENOUS | Status: AC | PRN
  Administered 2013-04-09: 10 via INTRAVENOUS

## 2013-04-09 MED ORDER — HEPARIN SOD (PORK) LOCK FLUSH 100 UNIT/ML IV SOLN
500.0000 [IU] | INTRAVENOUS | Status: DC | PRN
  Administered 2013-04-09: 500 [IU]
  Filled 2013-04-09: qty 5

## 2013-04-09 NOTE — Telephone Encounter (Signed)
lvm for pt regarding to sept appts...mailed pt appt sched/avs and letter

## 2013-04-09 NOTE — Progress Notes (Signed)
Utilization review completed.  

## 2013-04-09 NOTE — Progress Notes (Signed)
 Patient Name: Martin Morris Date of Encounter: 04/09/2013   Principal Problem:   Midsternal chest pain Active Problems:   CAD   HYPERLIPIDEMIA   HYPERTENSION   Edema   MANTLE CELL LYMPHOMA INTRA-ABDOMINAL LYMPH NODES   Obesity, unspecified   PERIPHERAL VASCULAR DISEASE   ARTHRITIS   SLEEP APNEA   BENIGN PROSTATIC HYPERTROPHY, HX OF   SUBJECTIVE  No chest pain or sob.  For cardiolite today.  CURRENT MEDS . acyclovir  400 mg Oral BID  . amLODipine  2.5 mg Oral Daily  . aspirin  81 mg Oral Daily  . atenolol  50 mg Oral Daily  . citalopram  20 mg Oral Daily  . clopidogrel  75 mg Oral Daily  . heparin  5,000 Units Subcutaneous Q8H  . isosorbide mononitrate  120 mg Oral Daily  . simvastatin  20 mg Oral q1800  . sulfamethoxazole-trimethoprim  1 tablet Oral Q M,W,F    OBJECTIVE  Filed Vitals:   04/09/13 0000 04/09/13 0400 04/09/13 1046 04/09/13 1059  BP: 176/90 169/95 153/84 157/92  Pulse: 55 54 57 65  Temp: 97.5 F (36.4 C) 98.2 F (36.8 C)    TempSrc: Oral Oral    Resp: 18 18    Height:      Weight:      SpO2: 95% 97%      Intake/Output Summary (Last 24 hours) at 04/09/13 1101 Last data filed at 04/09/13 0900  Gross per 24 hour  Intake    360 ml  Output      0 ml  Net    360 ml   Filed Weights   04/08/13 2003  Weight: 205 lb 3.2 oz (93.078 kg)    PHYSICAL EXAM  General: Pleasant, NAD. Neuro: Alert and oriented X 3. Moves all extremities spontaneously. Psych: Normal affect. HEENT:  Normal  Neck: Supple without bruits or JVD. Lungs:  Resp regular and unlabored, CTA. Heart: RRR no s3, s4, or murmurs. Abdomen: Soft, non-tender, non-distended, BS + x 4.  Extremities: No clubbing, cyanosis.  R>L 2-3+ bilat LE edema w/ erythema on right. DP/PT/Radials 1+ and equal bilaterally.  Accessory Clinical Findings  CBC  Recent Labs  04/08/13 1002 04/08/13 1440 04/08/13 2032  WBC 4.1 4.1 3.8*  NEUTROABS 2.5  --   --   HGB 11.9* 10.5* 11.4*  HCT  35.9* 30.9* 34.4*  MCV 100.8* 100.0 101.2*  PLT 220 207 224   Basic Metabolic Panel  Recent Labs  04/08/13 1002 04/08/13 1440 04/08/13 2032  NA 146* 142  --   K 3.9 3.8  --   CL  --  107  --   CO2 26 26  --   GLUCOSE 87 120*  --   BUN 17.4 18  --   CREATININE 1.3 1.27 1.28  CALCIUM 9.4 8.8  --    Liver Function Tests  Recent Labs  04/08/13 1002  AST 20  ALT 11  ALKPHOS 77  BILITOT 0.64  PROT 6.6  ALBUMIN 3.7   Cardiac Enzymes  Recent Labs  04/08/13 2032 04/09/13 0201  TROPONINI <0.30 <0.30   TELE  Seen in nuc med - in sinus.  ECG  Sb, 53, no acute st/t changes.  Radiology/Studies  Dg Chest 2 View  04/08/2013   *RADIOLOGY REPORT*  Clinical Data: Back and chest pain.  History of lymphoma. Nonsmoker.  Hypertension.  CHEST - 2 VIEW  Comparison: 09/07/2012  Findings: Mild hyperinflation.  Accentuation of expected thoracic kyphosis with   mild lower thoracic spondylosis.  Median sternotomy for CABG.  Right-sided Port-A-Cath terminates at the mid SVC.  Midline trachea.  Mild cardiomegaly with atherosclerosis in the transverse aorta. No pleural effusion or pneumothorax.  Mild nonspecific interstitial thickening.  Mild scarring at the left lung base.  IMPRESSION: No acute cardiopulmonary disease.  Cardiomegaly with prior median sternotomy.   Original Report Authenticated By: Kyle Talbot, M.D.   ASSESSMENT AND PLAN  1.  Midsternal chest pain:  No recurrence.  CE neg.  For cardiolite today.  Cont asa, statin, plavix, bb, nitrate.  2.  HTN:  BP elevated.  Titrate norvasc to 5mg daily.  3.  HL:  Cont statin.  LFT's ok.  Signed, Keagan Brislin NP  

## 2013-04-09 NOTE — Telephone Encounter (Signed)
Per staff phone call and POF I have schedueld appts.  JMW  

## 2013-04-09 NOTE — Progress Notes (Signed)
  Echocardiogram 2D Echocardiogram has been performed.  Georgian Co 04/09/2013, 2:29 PM

## 2013-04-09 NOTE — Discharge Summary (Signed)
Patient ID: Martin Morris,  MRN: [id number], DOB/AGE: 77/01/1933 77 y.o.  Admit date: 04/08/2013 Discharge date: 04/09/2013  Primary Care Provider: [name] Primary Cardiologist: J. Hochrein, MD   Discharge Diagnoses Principal Problem:   Midsternal chest pain  **s/p Lexiscan Cardiolite this admission revealing mild inferolateral and inferior ischemia.  Active Problems:   CAD   HYPERLIPIDEMIA   HYPERTENSION   Edema   MANTLE CELL LYMPHOMA INTRA-ABDOMINAL LYMPH NODES   Obesity, unspecified   PERIPHERAL VASCULAR DISEASE   ARTHRITIS   SLEEP APNEA   BENIGN PROSTATIC HYPERTROPHY, HX OF  Allergies Allergies  Allergen Reactions  . Atorvastatin     Incredible dizziness   Procedures  Lexiscan Cardiolite 04/09/2013  Impression:  Lexiscan Sestamibi:  Clinically negative, electrically negative for ischemia.  MIBI scan with evidence of a ischemia in the inferolateral wall  and mild ischemia in the inferior wall. Overall region small to medium in size.   LVEF on gating calculated at 54%. _____________   History of Present Illness  79 y/o male with a h/o CAD s/p CABG in 2003.  He was in his USOH until the morning of admission when he began to experience anterior chest pressure with radiation across his shoulders, down his back, and into both arms, associated with nausea.  He left the restaurant that he was eating at and drove to his previously scheduled appointment at the [facility].  Symptoms had resolved at that point however when he alerted the nursing staff to his symptoms, he was advised to present to the ED for further evaluation.  There, ECG was non-acute and initial troponin was normal.  He was admitted for further evaluation.  Hospital Course  Patient ruled out for MI.  He had no further chest discomfort.  This AM, he underwent lexiscan cardiolite stress testing revealing mild inferolateral and inferior wall ischemia.  EF was 54%.  Overall this was felt to be a  low-risk study and thus Mr. Tory will be discharged home today.  We have titrated his amlodipine therapy for better BP control.  Our office will contact him on Monday to further review his stress test and consider outpatient catheterization.  Discharge Vitals Blood pressure 168/88, pulse 60, temperature 97.6 F (36.4 C), temperature source Oral, resp. rate 20, height 5\' 9"  (1.753 m), weight 205 lb 3.2 oz (93.078 kg), SpO2 98.00%.  Filed Weights   04/08/13 2003  Weight: 205 lb 3.2 oz (93.078 kg)   Labs  CBC  Recent Labs  04/08/13 1002 04/08/13 1440 04/08/13 2032  WBC 4.1 4.1 3.8*  NEUTROABS 2.5  --   --   HGB 11.9* 10.5* 11.4*  HCT 35.9* 30.9* 34.4*  MCV 100.8* 100.0 101.2*  PLT 220 207 224   Basic Metabolic Panel  Recent Labs  04/08/13 1002 04/08/13 1440 04/08/13 2032  NA 146* 142  --   K 3.9 3.8  --   CL  --  107  --   CO2 26 26  --   GLUCOSE 87 120*  --   BUN 17.4 18  --   CREATININE 1.3 1.27 1.28  CALCIUM 9.4 8.8  --    Liver Function Tests  Recent Labs  04/08/13 1002  AST 20  ALT 11  ALKPHOS 77  BILITOT 0.64  PROT 6.6  ALBUMIN 3.7   Cardiac Enzymes  Recent Labs  04/08/13 2032 04/09/13 0201 04/09/13 1610  TROPONINI <0.30 <0.30 <0.30   Disposition  Pt is being discharged home today in good  condition.  Follow-up Plans & Appointments      Follow-up Information   Follow up with [name], MD On 06/16/2013. (11:30 AM)    Contact information:   [employer] Office [phone]     Discharge Medications    Medication List         acyclovir 400 MG tablet  Commonly known as:  ZOVIRAX  Take 400 mg by mouth 2 (two) times daily.     amLODipine 5 MG tablet  Commonly known as:  NORVASC  Take 1 tablet (5 mg total) by mouth daily.     aspirin 81 MG tablet  Take 81 mg by mouth every other day.     atenolol 50 MG tablet  Commonly known as:  TENORMIN  Take 50 mg by mouth daily.     citalopram 20 MG tablet  Commonly  known as:  CELEXA  Take 20 mg by mouth daily.     clopidogrel 75 MG tablet  Commonly known as:  PLAVIX  Take 75 mg by mouth daily.     furosemide 20 MG tablet  Commonly known as:  LASIX  Take 20 mg by mouth daily as needed. For fluid.     isosorbide mononitrate 120 MG 24 hr tablet  Commonly known as:  IMDUR  Take 120 mg by mouth daily.     lidocaine-prilocaine cream  Commonly known as:  EMLA  Apply 1 application topically See admin instructions. Apply to port 1 hour before treatment. Cover site with plastic wrap     naproxen sodium 220 MG tablet  Commonly known as:  ANAPROX  Take 440 mg by mouth 2 (two) times daily as needed. For pain.     nitroGLYCERIN 0.4 MG SL tablet  Commonly known as:  NITROSTAT  Place 0.4 mg under the tongue every 5 (five) minutes as needed for chest pain.     pravastatin 40 MG tablet  Commonly known as:  PRAVACHOL  Take 80 mg by mouth at bedtime.     PRESCRIPTION MEDICATION  Inject into the vein every 14 (fourteen) days. Velcade and Cytoxan.     sulfamethoxazole-trimethoprim 800-160 MG per tablet  Commonly known as:  BACTRIM DS  Take 1 tablet by mouth every Monday, Wednesday, and Friday.      Outstanding Labs/Studies  None  Duration of Discharge Encounter   Greater than 30 minutes including physician time.  Signed, Nicolasa Ducking NP 04/09/2013, 6:18 PM   History and all data above reviewed.  Patient examined.  I agree with the findings as above.  The patient exam reveals COR:RRR  ,  Lungs: Clear  ,  Abd: Positive bowel sounds, no rebound no guarding , Ext Mild edema.    .  All available labs, radiology testing, previous records reviewed. Agree with documented assessment and plan. I have reviewed the static Lexiscan Myoview images.  I see no high risk findings of ischemia.  However, there is evidence of inferolateral ischemia per Dr. Tenny Craw  OK to discharge.  We will increase Norvasc as above.  I will discuss with him the possibility of  cath.      [name], MD  6:18 PM  04/09/2013

## 2013-04-12 ENCOUNTER — Other Ambulatory Visit (INDEPENDENT_AMBULATORY_CARE_PROVIDER_SITE_OTHER): Payer: Medicare Other

## 2013-04-12 ENCOUNTER — Telehealth: Payer: Self-pay | Admitting: *Deleted

## 2013-04-12 ENCOUNTER — Telehealth: Payer: Self-pay | Admitting: Cardiology

## 2013-04-12 DIAGNOSIS — R9439 Abnormal result of other cardiovascular function study: Secondary | ICD-10-CM

## 2013-04-12 DIAGNOSIS — R609 Edema, unspecified: Secondary | ICD-10-CM

## 2013-04-12 LAB — PROTIME-INR
INR: 1.1 ratio — ABNORMAL HIGH (ref 0.8–1.0)
Prothrombin Time: 11.7 s (ref 10.2–12.4)

## 2013-04-12 NOTE — Telephone Encounter (Signed)
Discussed abnormal stress test results with the patient.  Given this and his pain he needs to have cardiac cath.  The patient understands that risks included but are not limited to stroke (1 in 1000), death (1 in 1000), kidney failure [usually temporary] (1 in 500), bleeding (1 in 200), allergic reaction [possibly serious] (1 in 200).  The patient understands and agrees to proceed.   I will do this in the [facility] lab tomorrow.

## 2013-04-12 NOTE — Telephone Encounter (Deleted)
Dr. Antoine Poche called to request for patient to be scheduled for cath on 04/13/13.  Scheduled for

## 2013-04-12 NOTE — Telephone Encounter (Signed)
Patient came in for pre-cath lab work and instructions for cath procedure. Patient also notified that he is to come in one hour prior to procedure. Letter provided for patient detailing Pre-Cath instructions/medications. Patient verbalized understanding.

## 2013-04-12 NOTE — Telephone Encounter (Signed)
Dr. Antoine Poche called to request for patient to be scheduled for cath on 04/13/13. Scheduled in [facility] lab for 04/13/13 at 9:30 am.  Patient will come in today for lab work and [employer].

## 2013-04-13 ENCOUNTER — Encounter (HOSPITAL_BASED_OUTPATIENT_CLINIC_OR_DEPARTMENT_OTHER)
Admission: RE | Disposition: A | Discharge status: Home / Self Care | Payer: Self-pay | Source: Ambulatory Visit | Attending: Cardiology

## 2013-04-13 ENCOUNTER — Inpatient Hospital Stay (HOSPITAL_BASED_OUTPATIENT_CLINIC_OR_DEPARTMENT_OTHER)
Admission: RE | Admit: 2013-04-13 | Discharge: 2013-04-13 | Disposition: A | Discharge status: Home / Self Care | Payer: Medicare Other | Source: Ambulatory Visit | Attending: Cardiology | Admitting: Cardiology

## 2013-04-13 ENCOUNTER — Encounter (HOSPITAL_BASED_OUTPATIENT_CLINIC_OR_DEPARTMENT_OTHER): Payer: Self-pay

## 2013-04-13 DIAGNOSIS — I2510 Atherosclerotic heart disease of native coronary artery without angina pectoris: Secondary | ICD-10-CM | POA: Insufficient documentation

## 2013-04-13 DIAGNOSIS — E669 Obesity, unspecified: Secondary | ICD-10-CM | POA: Insufficient documentation

## 2013-04-13 DIAGNOSIS — I10 Essential (primary) hypertension: Secondary | ICD-10-CM | POA: Insufficient documentation

## 2013-04-13 DIAGNOSIS — I25810 Atherosclerosis of coronary artery bypass graft(s) without angina pectoris: Secondary | ICD-10-CM | POA: Insufficient documentation

## 2013-04-13 DIAGNOSIS — C8313 Mantle cell lymphoma, intra-abdominal lymph nodes: Secondary | ICD-10-CM | POA: Insufficient documentation

## 2013-04-13 DIAGNOSIS — I200 Unstable angina: Secondary | ICD-10-CM | POA: Insufficient documentation

## 2013-04-13 SURGERY — JV LEFT HEART CATHETERIZATION WITH CORONARY ANGIOGRAM

## 2013-04-13 MED ORDER — SODIUM CHLORIDE 0.9 % IV SOLN
INTRAVENOUS | Status: DC
  Administered 2013-04-13: 09:00:00 via INTRAVENOUS

## 2013-04-13 NOTE — Interval H&P Note (Signed)
History and Physical Interval Note:  04/13/2013 9:39 AM  Martin Morris  has presented today for surgery, with the diagnosis of cp  The various methods of treatment have been discussed with the patient and family. After consideration of risks, benefits and other options for treatment, the patient has consented to  Procedure(s): JV LEFT HEART CATHETERIZATION WITH CORONARY ANGIOGRAM (N/A) as a surgical intervention .  The patient's history has been reviewed, patient examined, no change in status, stable for surgery.  I have reviewed the patient's chart and labs.  Questions were answered to the patient's satisfaction.     [name]

## 2013-04-13 NOTE — Progress Notes (Signed)
Bedrest begins @ 1035, tegaderm dressing applied to right groin site by [employer] Melia, site level 0.

## 2013-04-13 NOTE — H&P (View-Only) (Signed)
Patient Name: Martin Morris Date of Encounter: 04/09/2013   Principal Problem:   Midsternal chest pain Active Problems:   CAD   HYPERLIPIDEMIA   HYPERTENSION   Edema   MANTLE CELL LYMPHOMA INTRA-ABDOMINAL LYMPH NODES   Obesity, unspecified   PERIPHERAL VASCULAR DISEASE   ARTHRITIS   SLEEP APNEA   BENIGN PROSTATIC HYPERTROPHY, HX OF   SUBJECTIVE  No chest pain or sob.  For cardiolite today.  CURRENT MEDS . acyclovir  400 mg Oral BID  . amLODipine  2.5 mg Oral Daily  . aspirin  81 mg Oral Daily  . atenolol  50 mg Oral Daily  . citalopram  20 mg Oral Daily  . clopidogrel  75 mg Oral Daily  . heparin  5,000 Units Subcutaneous Q8H  . isosorbide mononitrate  120 mg Oral Daily  . simvastatin  20 mg Oral q1800  . sulfamethoxazole-trimethoprim  1 tablet Oral Q M,W,F    OBJECTIVE  Filed Vitals:   04/09/13 0000 04/09/13 0400 04/09/13 1046 04/09/13 1059  BP: 176/90 169/95 153/84 157/92  Pulse: 55 54 57 65  Temp: 97.5 F (36.4 C) 98.2 F (36.8 C)    TempSrc: Oral Oral    Resp: 18 18    Height:      Weight:      SpO2: 95% 97%      Intake/Output Summary (Last 24 hours) at 04/09/13 1101 Last data filed at 04/09/13 0900  Gross per 24 hour  Intake    360 ml  Output      0 ml  Net    360 ml   Filed Weights   04/08/13 2003  Weight: 205 lb 3.2 oz (93.078 kg)    PHYSICAL EXAM  General: Pleasant, NAD. Neuro: Alert and oriented X 3. Moves all extremities spontaneously. Psych: Normal affect. HEENT:  Normal  Neck: Supple without bruits or JVD. Lungs:  Resp regular and unlabored, CTA. Heart: RRR no s3, s4, or murmurs. Abdomen: Soft, non-tender, non-distended, BS + x 4.  Extremities: No clubbing, cyanosis.  R>L 2-3+ bilat LE edema w/ erythema on right. DP/PT/Radials 1+ and equal bilaterally.  Accessory Clinical Findings  CBC  Recent Labs  04/08/13 1002 04/08/13 1440 04/08/13 2032  WBC 4.1 4.1 3.8*  NEUTROABS 2.5  --   --   HGB 11.9* 10.5* 11.4*  HCT  35.9* 30.9* 34.4*  MCV 100.8* 100.0 101.2*  PLT 220 207 224   Basic Metabolic Panel  Recent Labs  04/08/13 1002 04/08/13 1440 04/08/13 2032  NA 146* 142  --   K 3.9 3.8  --   CL  --  107  --   CO2 26 26  --   GLUCOSE 87 120*  --   BUN 17.4 18  --   CREATININE 1.3 1.27 1.28  CALCIUM 9.4 8.8  --    Liver Function Tests  Recent Labs  04/08/13 1002  AST 20  ALT 11  ALKPHOS 77  BILITOT 0.64  PROT 6.6  ALBUMIN 3.7   Cardiac Enzymes  Recent Labs  04/08/13 2032 04/09/13 0201  TROPONINI <0.30 <0.30   TELE  Seen in nuc med - in sinus.  ECG  Sb, 53, no acute st/t changes.  Radiology/Studies  Dg Chest 2 View  04/08/2013   *RADIOLOGY REPORT*  Clinical Data: Back and chest pain.  History of lymphoma. Nonsmoker.  Hypertension.  CHEST - 2 VIEW  Comparison: 09/07/2012  Findings: Mild hyperinflation.  Accentuation of expected thoracic kyphosis with  mild lower thoracic spondylosis.  Median sternotomy for CABG.  Right-sided Port-A-Cath terminates at the mid SVC.  Midline trachea.  Mild cardiomegaly with atherosclerosis in the transverse aorta. No pleural effusion or pneumothorax.  Mild nonspecific interstitial thickening.  Mild scarring at the left lung base.  IMPRESSION: No acute cardiopulmonary disease.  Cardiomegaly with prior median sternotomy.   Original Report Authenticated By: Jeronimo Greaves, M.D.   ASSESSMENT AND PLAN  1.  Midsternal chest pain:  No recurrence.  CE neg.  For cardiolite today.  Cont asa, statin, plavix, bb, nitrate.  2.  HTN:  BP elevated.  Titrate norvasc to 5mg  daily.  3.  HL:  Cont statin.  LFT's ok.  Signed, Nicolasa Ducking NP

## 2013-04-13 NOTE — CV Procedure (Signed)
   Cardiac Catheterization Procedure Note  Name: Martin Morris MRN: [id number] DOB: [birth year]  Procedure: Left Heart Cath, Selective Coronary Angiography, LV angiography  Indication:   Unstable angina, abnormal perfusion study with moderate inferolateral ischemia.  Procedural details: The right groin was prepped, draped, and anesthetized with 1% lidocaine. Using modified Seldinger technique, a 5 French sheath was introduced into the right femoral artery. Standard Judkins catheters were used for coronary angiography and left ventriculography. Catheter exchanges were performed over a guidewire. There were no immediate procedural complications. The patient was transferred to the post catheterization recovery area for further monitoring.  Procedural Findings:   Hemodynamics:     AO 173/78    LV 173/27   Coronary angiography:   Coronary dominance: Left   Left mainstem:   Stent patent with diffuse in stent long 30% stenosis.    Left anterior descending (LAD):   The is a narrow caliber vessel that wraps the apex.  Long proximal 90% stenosis.  Mid long severe diffuse disease.  Moderate sized D1 with severe ostial disease involving the LAD and diffuse moderate stenosis.  D2 large with severe ostial disease involving the LAD and severe diffuse proximal disease.  Left circumflex (LCx):  AV groove large vessel with long proximal 50% stenosis.  Large PDA is free of high grade disease.   Right coronary artery (RCA):  Small nondominant.  Mid long 90% stenosis.    Grafts:   LIMA to LAD:  Patent with 70% stenosis in the body of the graft and a 50% lesion at the anastomosis with the LAD.     SVG TO OM AND PDA:  Occluded at the ostium   SVG TO DIAG:  Occluded at the ostium  Left ventriculography: Left ventricular systolic function is mildly reduced with mild inferior hypokinesisl, LVEF is estimated at 55, there is no significant mitral regurgitation   Final Conclusions:  Severe native  vessel disease.  Two of three grafts occluded.  The LIMA to the LAD has moderate graft stenosis.  Preserved EF.    Recommendations:   At this point the patient is a poor candidate for redo CABG.  I willl manage him medically.  However, if he has continued pain we could consider PCI of the LIMA.  However, this was not the area that demonstrated ischemia on the perfusion study.    [name] 04/13/2013, 10:24 AM

## 2013-04-14 ENCOUNTER — Telehealth: Payer: Self-pay | Admitting: *Deleted

## 2013-04-14 ENCOUNTER — Telehealth: Payer: Self-pay | Admitting: Oncology

## 2013-04-14 NOTE — Telephone Encounter (Signed)
returned pt call and advised that tx was now on same day as lab and est

## 2013-04-14 NOTE — Telephone Encounter (Signed)
Per staff message I have moved 9/12 to 9/11.  JMW

## 2013-05-03 ENCOUNTER — Encounter (HOSPITAL_COMMUNITY): Payer: Self-pay

## 2013-05-03 ENCOUNTER — Ambulatory Visit (HOSPITAL_COMMUNITY)
Admission: RE | Admit: 2013-05-03 | Discharge: 2013-05-03 | Disposition: A | Discharge status: Home / Self Care | Payer: Medicare Other | Source: Ambulatory Visit | Attending: Physician Assistant | Admitting: Physician Assistant

## 2013-05-03 DIAGNOSIS — Z8529 Personal history of malignant neoplasm of other respiratory and intrathoracic organs: Secondary | ICD-10-CM | POA: Insufficient documentation

## 2013-05-03 DIAGNOSIS — Z923 Personal history of irradiation: Secondary | ICD-10-CM | POA: Insufficient documentation

## 2013-05-03 DIAGNOSIS — I2510 Atherosclerotic heart disease of native coronary artery without angina pectoris: Secondary | ICD-10-CM | POA: Insufficient documentation

## 2013-05-03 DIAGNOSIS — C8313 Mantle cell lymphoma, intra-abdominal lymph nodes: Secondary | ICD-10-CM

## 2013-05-03 DIAGNOSIS — K5730 Diverticulosis of large intestine without perforation or abscess without bleeding: Secondary | ICD-10-CM | POA: Insufficient documentation

## 2013-05-03 DIAGNOSIS — I700 Atherosclerosis of aorta: Secondary | ICD-10-CM | POA: Insufficient documentation

## 2013-05-03 DIAGNOSIS — Z79899 Other long term (current) drug therapy: Secondary | ICD-10-CM | POA: Insufficient documentation

## 2013-05-03 DIAGNOSIS — N3289 Other specified disorders of bladder: Secondary | ICD-10-CM | POA: Insufficient documentation

## 2013-05-03 DIAGNOSIS — Z951 Presence of aortocoronary bypass graft: Secondary | ICD-10-CM | POA: Insufficient documentation

## 2013-05-03 DIAGNOSIS — K4090 Unilateral inguinal hernia, without obstruction or gangrene, not specified as recurrent: Secondary | ICD-10-CM | POA: Insufficient documentation

## 2013-05-03 MED ORDER — IOHEXOL 300 MG/ML  SOLN
100.0000 mL | Freq: Once | INTRAMUSCULAR | Status: AC | PRN
  Administered 2013-05-03: 100 mL via INTRAVENOUS

## 2013-05-06 ENCOUNTER — Other Ambulatory Visit (HOSPITAL_BASED_OUTPATIENT_CLINIC_OR_DEPARTMENT_OTHER): Payer: Medicare Other | Admitting: Lab

## 2013-05-06 ENCOUNTER — Ambulatory Visit: Payer: Medicare Other

## 2013-05-06 ENCOUNTER — Telehealth: Payer: Self-pay | Admitting: Internal Medicine

## 2013-05-06 ENCOUNTER — Ambulatory Visit (HOSPITAL_BASED_OUTPATIENT_CLINIC_OR_DEPARTMENT_OTHER): Payer: Medicare Other | Admitting: Internal Medicine

## 2013-05-06 VITALS — BP 183/92 | HR 76 | Temp 98.5°F | Resp 18 | Ht 69.0 in | Wt 205.2 lb

## 2013-05-06 DIAGNOSIS — C8313 Mantle cell lymphoma, intra-abdominal lymph nodes: Secondary | ICD-10-CM

## 2013-05-06 DIAGNOSIS — I2510 Atherosclerotic heart disease of native coronary artery without angina pectoris: Secondary | ICD-10-CM

## 2013-05-06 LAB — CBC WITH DIFFERENTIAL/PLATELET
Basophils Absolute: 0 10*3/uL (ref 0.0–0.1)
EOS%: 5.5 % (ref 0.0–7.0)
Eosinophils Absolute: 0.4 10*3/uL (ref 0.0–0.5)
HGB: 12.8 g/dL — ABNORMAL LOW (ref 13.0–17.1)
MCH: 33.3 pg (ref 27.2–33.4)
NEUT#: 3.7 10*3/uL (ref 1.5–6.5)
RDW: 13 % (ref 11.0–14.6)
lymph#: 1.6 10*3/uL (ref 0.9–3.3)

## 2013-05-06 NOTE — Telephone Encounter (Signed)
Gave pt appt for lab and MD on October 2014, chemo cancelled per MD

## 2013-05-06 NOTE — Patient Instructions (Signed)
Bortezomib injection What is this medicine? BORTEZOMIB (bor TEZ oh mib) is a chemotherapy drug. It slows the growth of cancer cells. This medicine is used to treat multiple myeloma, lymphoma, and other cancers. This medicine may be used for other purposes; ask your health care provider or pharmacist if you have questions. What should I tell my health care provider before I take this medicine? They need to know if you have any of these conditions: -heart disease -irregular heartbeat -liver disease -low blood counts, like low white blood cells, platelets, or hemoglobin -peripheral neuropathy -taking medicine for blood pressure -an unusual or allergic reaction to bortezomib, mannitol, boron, other medicines, foods, dyes, or preservatives -pregnant or trying to get pregnant -breast-feeding How should I use this medicine? This medicine is for injection into a vein or for injection under the skin. It is given by a health care professional in a hospital or clinic setting. Talk to your pediatrician regarding the use of this medicine in children. Special care may be needed.   1. Hold chemotherapy for now given active ischemic symptoms.   Scans reveal no evidence of disease.   Reschedule in 2 months.  F/u for history in physical in 1 months with labs.  Overdosage: If you think you have taken too much of this medicine contact a poison control center or emergency room at once. NOTE: This medicine is only for you. Do not share this medicine with others. What if I miss a dose? It is important not to miss your dose. Call your doctor or health care professional if you are unable to keep an appointment. What may interact with this medicine? -medicines for diabetes -medicines to increase blood counts like filgrastim, pegfilgrastim, sargramostim -zalcitabine Talk to your doctor or health care professional before taking any of these  medicines: -acetaminophen -aspirin -ibuprofen -ketoprofen -naproxen This list may not describe all possible interactions. Give your health care provider a list of all the medicines, herbs, non-prescription drugs, or dietary supplements you use. Also tell them if you smoke, drink alcohol, or use illegal drugs. Some items may interact with your medicine. What should I watch for while using this medicine? Visit your doctor for checks on your progress. This drug may make you feel generally unwell. This is not uncommon, as chemotherapy can affect healthy cells as well as cancer cells. Report any side effects. Continue your course of treatment even though you feel ill unless your doctor tells you to stop. You may get drowsy or dizzy. Do not drive, use machinery, or do anything that needs mental alertness until you know how this medicine affects you. Do not stand or sit up quickly, especially if you are an older patient. This reduces the risk of dizzy or fainting spells. In some cases, you may be given additional medicines to help with side effects. Follow all directions for their use. Call your doctor or health care professional for advice if you get a fever, chills or sore throat, or other symptoms of a cold or flu. Do not treat yourself. This drug decreases your body's ability to fight infections. Try to avoid being around people who are sick. This medicine may increase your risk to bruise or bleed. Call your doctor or health care professional if you notice any unusual bleeding. Be careful brushing and flossing your teeth or using a toothpick because you may get an infection or bleed more easily. If you have any dental work done, tell your dentist you are receiving this medicine. Avoid taking  products that contain aspirin, acetaminophen, ibuprofen, naproxen, or ketoprofen unless instructed by your doctor. These medicines may hide a fever. Do not become pregnant while taking this medicine. Women should  inform their doctor if they wish to become pregnant or think they might be pregnant. There is a potential for serious side effects to an unborn child. Talk to your health care professional or pharmacist for more information. Do not breast-feed an infant while taking this medicine. You may have vomiting or diarrhea while taking this medicine. Drink water or other fluids as directed. What side effects may I notice from receiving this medicine? Side effects that you should report to your doctor or health care professional as soon as possible: -allergic reactions like skin rash, itching or hives, swelling of the face, lips, or tongue -breathing problems -changes in hearing -changes in vision -fast, irregular heartbeat -feeling faint or lightheaded, falls -pain, tingling, numbness in the hands or feet -seizures -swelling of the ankles, feet, hands -unusual bleeding or bruising -unusually weak or tired -vomiting Side effects that usually do not require medical attention (report to your doctor or health care professional if they continue or are bothersome): -changes in emotions or moods -constipation -diarrhea -loss of appetite -headache -irritation at site where injected -nausea This list may not describe all possible side effects. Call your doctor for medical advice about side effects. You may report side effects to FDA at 1-800-FDA-1088. Where should I keep my medicine? This drug is given in a hospital or clinic and will not be stored at home. NOTE: This sheet is a summary. It may not cover all possible information. If you have questions about this medicine, talk to your doctor, pharmacist, or health care provider.  2013, [employer]. (09/19/2010 11:42:36 AM) Cyclophosphamide injection What is this medicine? CYCLOPHOSPHAMIDE (sye kloe FOSS fa mide) is a chemotherapy drug. It slows the growth of cancer cells. This medicine is used to treat many types of cancer like lymphoma,  myeloma, leukemia, breast cancer, and ovarian cancer, to name a few. It is also used to treat nephrotic syndrome in children. This medicine may be used for other purposes; ask your health care provider or pharmacist if you have questions. What should I tell my health care provider before I take this medicine? They need to know if you have any of these conditions: -blood disorders -history of other chemotherapy -history of radiation therapy -infection -kidney disease -liver disease -tumors in the bone marrow -an unusual or allergic reaction to cyclophosphamide, other chemotherapy, other medicines, foods, dyes, or preservatives -pregnant or trying to get pregnant -breast-feeding How should I use this medicine? This drug is usually given as an injection into a vein or muscle or by infusion into a vein. It is administered in a hospital or clinic by a specially trained health care professional. Talk to your pediatrician regarding the use of this medicine in children. While this drug may be prescribed for selected conditions, precautions do apply. Overdosage: If you think you have taken too much of this medicine contact a poison control center or emergency room at once. NOTE: This medicine is only for you. Do not share this medicine with others. What if I miss a dose? It is important not to miss your dose. Call your doctor or health care professional if you are unable to keep an appointment. What may interact with this medicine? Do not take this medicine with any of the following medications: -mibefradil -nalidixic acid This medicine may also interact with the  following medications: -doxorubicin -etanercept -medicines to increase blood counts like filgrastim, pegfilgrastim, sargramostim -medicines that block muscle or nerve pain -St. John's Wort -phenobarbital -succinylcholine chloride -trastuzumab -vaccines Talk to your doctor or health care professional before taking any of these  medicines: -acetaminophen -aspirin -ibuprofen -ketoprofen -naproxen This list may not describe all possible interactions. Give your health care provider a list of all the medicines, herbs, non-prescription drugs, or dietary supplements you use. Also tell them if you smoke, drink alcohol, or use illegal drugs. Some items may interact with your medicine. What should I watch for while using this medicine? Visit your doctor for checks on your progress. This drug may make you feel generally unwell. This is not uncommon, as chemotherapy can affect healthy cells as well as cancer cells. Report any side effects. Continue your course of treatment even though you feel ill unless your doctor tells you to stop. Drink water or other fluids as directed. Urinate often, even at night. In some cases, you may be given additional medicines to help with side effects. Follow all directions for their use. Call your doctor or health care professional for advice if you get a fever, chills or sore throat, or other symptoms of a cold or flu. Do not treat yourself. This drug decreases your body's ability to fight infections. Try to avoid being around people who are sick. This medicine may increase your risk to bruise or bleed. Call your doctor or health care professional if you notice any unusual bleeding. Be careful brushing and flossing your teeth or using a toothpick because you may get an infection or bleed more easily. If you have any dental work done, tell your dentist you are receiving this medicine. Avoid taking products that contain aspirin, acetaminophen, ibuprofen, naproxen, or ketoprofen unless instructed by your doctor. These medicines may hide a fever. Do not become pregnant while taking this medicine. Women should inform their doctor if they wish to become pregnant or think they might be pregnant. There is a potential for serious side effects to an unborn child. Talk to your health care professional or pharmacist  for more information. Do not breast-feed an infant while taking this medicine. Men should inform their doctor if they wish to father a child. This medicine may lower sperm counts. If you are going to have surgery, tell your doctor or health care professional that you have taken this medicine. What side effects may I notice from receiving this medicine? Side effects that you should report to your doctor or health care professional as soon as possible: -allergic reactions like skin rash, itching or hives, swelling of the face, lips, or tongue -low blood counts - this medicine may decrease the number of white blood cells, red blood cells and platelets. You may be at increased risk for infections and bleeding. -signs of infection - fever or chills, cough, sore throat, pain or difficulty passing urine -signs of decreased platelets or bleeding - bruising, pinpoint red spots on the skin, black, tarry stools, blood in the urine -signs of decreased red blood cells - unusually weak or tired, fainting spells, lightheadedness -breathing problems -dark urine -mouth sores -pain, swelling, redness at site where injected -swelling of the ankles, feet, hands -trouble passing urine or change in the amount of urine -weight gain -yellowing of the eyes or skin Side effects that usually do not require medical attention (report to your doctor or health care professional if they continue or are bothersome): -changes in nail or skin color -diarrhea -  hair loss -loss of appetite -missed menstrual periods -nausea, vomiting -stomach pain This list may not describe all possible side effects. Call your doctor for medical advice about side effects. You may report side effects to FDA at 1-800-FDA-1088. Where should I keep my medicine? This drug is given in a hospital or clinic and will not be stored at home. NOTE: This sheet is a summary. It may not cover all possible information. If you have questions about this  medicine, talk to your doctor, pharmacist, or health care provider.  2013, [employer]. (11/17/2007 2:32:25 PM) Rituximab injection What is this medicine? RITUXIMAB (ri TUX i mab) is a monoclonal antibody. This medicine changes the way the body's immune system works. It is used commonly to treat non-Hodgkin's lymphoma and other conditions. In cancer cells, this drug targets a specific protein within cancer cells and stops the cancer cells from growing. It is also used to treat rhuematoid arthritis (RA). In RA, this medicine slow the inflammatory process and help reduce joint pain and swelling. This medicine is often used with other cancer or arthritis medications. This medicine may be used for other purposes; ask your health care provider or pharmacist if you have questions. What should I tell my health care provider before I take this medicine? They need to know if you have any of these conditions: -blood disorders -heart disease -history of hepatitis B -infection (especially a virus infection such as chickenpox, cold sores, or herpes) -irregular heartbeat -kidney disease -lung or breathing disease, like asthma -lupus -an unusual or allergic reaction to rituximab, mouse proteins, other medicines, foods, dyes, or preservatives -pregnant or trying to get pregnant -breast-feeding How should I use this medicine? This medicine is for infusion into a vein. It is administered in a hospital or clinic by a specially trained health care professional. A special MedGuide will be given to you by the pharmacist with each prescription and refill. Be sure to read this information carefully each time. Talk to your pediatrician regarding the use of this medicine in children. This medicine is not approved for use in children. Overdosage: If you think you have taken too much of this medicine contact a poison control center or emergency room at once. NOTE: This medicine is only for you. Do not share  this medicine with others. What if I miss a dose? It is important not to miss a dose. Call your doctor or health care professional if you are unable to keep an appointment. What may interact with this medicine? -cisplatin -medicines for blood pressure -some other medicines for arthritis -vaccines This list may not describe all possible interactions. Give your health care provider a list of all the medicines, herbs, non-prescription drugs, or dietary supplements you use. Also tell them if you smoke, drink alcohol, or use illegal drugs. Some items may interact with your medicine. What should I watch for while using this medicine? Report any side effects that you notice during your treatment right away, such as changes in your breathing, fever, chills, dizziness or lightheadedness. These effects are more common with the first dose. Visit your prescriber or health care professional for checks on your progress. You will need to have regular blood work. Report any other side effects. The side effects of this medicine can continue after you finish your treatment. Continue your course of treatment even though you feel ill unless your doctor tells you to stop. Call your doctor or health care professional for advice if you get a fever, chills or  sore throat, or other symptoms of a cold or flu. Do not treat yourself. This drug decreases your body's ability to fight infections. Try to avoid being around people who are sick. This medicine may increase your risk to bruise or bleed. Call your doctor or health care professional if you notice any unusual bleeding. Be careful brushing and flossing your teeth or using a toothpick because you may get an infection or bleed more easily. If you have any dental work done, tell your dentist you are receiving this medicine. Avoid taking products that contain aspirin, acetaminophen, ibuprofen, naproxen, or ketoprofen unless instructed by your doctor. These medicines may hide a  fever. Do not become pregnant while taking this medicine. Women should inform their doctor if they wish to become pregnant or think they might be pregnant. There is a potential for serious side effects to an unborn child. Talk to your health care professional or pharmacist for more information. Do not breast-feed an infant while taking this medicine. What side effects may I notice from receiving this medicine? Side effects that you should report to your doctor or health care professional as soon as possible: -allergic reactions like skin rash, itching or hives, swelling of the face, lips, or tongue -low blood counts - this medicine may decrease the number of white blood cells, red blood cells and platelets. You may be at increased risk for infections and bleeding. -signs of infection - fever or chills, cough, sore throat, pain or difficulty passing urine -signs of decreased platelets or bleeding - bruising, pinpoint red spots on the skin, black, tarry stools, blood in the urine -signs of decreased red blood cells - unusually weak or tired, fainting spells, lightheadedness -breathing problems -confused, not responsive -chest pain -fast, irregular heartbeat -feeling faint or lightheaded, falls -mouth sores -redness, blistering, peeling or loosening of the skin, including inside the mouth -stomach pain -swelling of the ankles, feet, or hands -trouble passing urine or change in the amount of urine Side effects that usually do not require medical attention (report to your doctor or other health care professional if they continue or are bothersome): -anxiety -headache -loss of appetite -muscle aches -nausea -night sweats This list may not describe all possible side effects. Call your doctor for medical advice about side effects. You may report side effects to FDA at 1-800-FDA-1088. Where should I keep my medicine? This drug is given in a hospital or clinic and will not be stored at home. NOTE:  This sheet is a summary. It may not cover all possible information. If you have questions about this medicine, talk to your doctor, pharmacist, or health care provider.  2012, [employer]. (04/11/2008 2:04:59 PM)

## 2013-05-07 ENCOUNTER — Ambulatory Visit: Payer: Medicare Other

## 2013-05-09 NOTE — Progress Notes (Signed)
Hematology and Oncology Follow Up Visit  Martin Morris [id number] 01/06/1933 79 y.o. 05/06/2013  5:00 pm Martin Hector, MD  Principle Diagnosis: Mantle cell lymphoma diagnosed in March 2008 with positive bone marrow initially on 12/03/2006 and then negative bone marrow after treatment in October 2009. As stated, the patient presented with obstructive liver abnormalities requiring ERCP and non metal stent  placement by Dr. Melvia Heaps. The stent was subsequently removed in October 2008.   Prior Therapy: The patient was treated with 8 cycles of Rituxan, Cytoxan, vincristine and Decadron in combination with Neulasta from 12/19/2006 through 05/15/2007. The patient then received maintenance Rituxan from July 16, 2007 through February 01, 2010. While on Rituxan, he had a recurrence of his disease by CT scan carried out on 01/24/2010. Martin Morris then received treatment with bendamustine and Rituxan in combination with Neulasta for 6 cycles from 02/19/2010 through 07/26/2010. We have a prior PET scan from 08/22/2010 and CT scans of chest, abdomen, and pelvis from 01/01/2011 that showed no evidence of disease. He had been off treatment since December 2011 and had been doing well without any symptoms until the CT scan of the abdomen and pelvis on 07/01/2011 showed signs of recurrence. CT scans of abdomen and pelvis with IVC on 09/02/11 showed further progression. Rituxan, subcutaneous Velcade, intravenous Cytoxan and Decadron were initiated on 10/15/2011. CT scans of the abdomen and pelvis carried out on 01/08/2012 showed a near complete response to therapy. CT scan of the abdomen and pelvis with IV contrast carried out on 06/19/2012 showed stable plaque-like soft tissue density in the upper abdominal retroperitoneum compared with the CT scan of 01/08/2012. CT scan of the abdomen and pelvis with IV contrast carried out on 11/30/2012 showed no evidence of recurrent lymphoma.   Current therapy: As of  12/04/2012, treatment program will be as follows:  -Velcade 2.75 mg subcu.  -Cytoxan 400 mg IV.  -Decadron 20 mg IV.  The above drugs will be given every 2 weeks.  -Rituxan 800 mg IV every 4 weeks.   Previously, beginning on 10/15/2011, Velcade, Cytoxan, and Decadron were  being administered weekly, and Rituxan was being administered every 3 weeks.  Interim History:   Patient is in today for a followup visit prior to proceeding with his next scheduled cycle of Rituxan, Velcade, Cytoxan and dexamethasone for treatment of his relapsed mantle cell lymphoma, originally diagnosed March 2008. Last restaging CT scans were done in April of 2014 and were negative for any evidence of disease recurrence.  He was seen last month for chemotherapy but had chest pain and it was held.  He underwent a cardiac cath which revealed two of three native vessel ischemic disease on 04/13/2013 but he is a poor surgical candidate for a re-do CABG. Overall he's tolerated his treatment without difficulty.   Today, he reports his chest discomfort happens primarily when he receives his chemotherapy.  He states that it is much improved since he has been off his therapy.  He has had interim scans as noted below.  His wife is accompanying him today.   Medications: I have reviewed the patient's current medications.  Current Outpatient Prescriptions  Medication Sig Dispense Refill  . acyclovir (ZOVIRAX) 400 MG tablet Take 400 mg by mouth 2 (two) times daily.      Marland Kitchen amLODipine (NORVASC) 5 MG tablet Take 1 tablet (5 mg total) by mouth daily.  30 tablet  6  . aspirin 81 MG tablet Take 81 mg by mouth every other  day.      . atenolol (TENORMIN) 50 MG tablet Take 50 mg by mouth daily.      . citalopram (CELEXA) 20 MG tablet Take 20 mg by mouth daily.      . clopidogrel (PLAVIX) 75 MG tablet Take 75 mg by mouth daily.      . furosemide (LASIX) 20 MG tablet Take 20 mg by mouth daily as needed. For fluid.      . isosorbide mononitrate  (IMDUR) 120 MG 24 hr tablet Take 120 mg by mouth daily.      Marland Kitchen lidocaine-prilocaine (EMLA) cream Apply 1 application topically See admin instructions. Apply to port 1 hour before treatment. Cover site with plastic wrap      . naproxen sodium (ANAPROX) 220 MG tablet Take 440 mg by mouth 2 (two) times daily as needed. For pain.      . nitroGLYCERIN (NITROSTAT) 0.4 MG SL tablet Place 0.4 mg under the tongue every 5 (five) minutes as needed for chest pain.      . pravastatin (PRAVACHOL) 40 MG tablet Take 80 mg by mouth at bedtime.      Marland Kitchen PRESCRIPTION MEDICATION Inject into the vein every 14 (fourteen) days. Velcade and Cytoxan.      . sulfamethoxazole-trimethoprim (BACTRIM DS) 800-160 MG per tablet Take 1 tablet by mouth every Monday, Wednesday, and Friday.       No current facility-administered medications for this visit.   Facility-Administered Medications Ordered in Other Visits  Medication Dose Route Frequency Provider Last Rate Last Dose  . sodium chloride 0.9 % injection 10 mL  10 mL Intracatheter PRN Samul Dada, MD   10 mL at 08/03/12 1419     Allergies:  Allergies  Allergen Reactions  . Atorvastatin     Incredible dizziness    Past Medical History, Surgical history, Social history, and Family History were reviewed and updated.  Review of Systems: Constitutional:  Negative for fever, chills, night sweats, anorexia, weight loss, pain. Cardiovascular: no chest pain or dyspnea on exertion Respiratory: no cough, shortness of breath, or wheezing Neurological: no TIA or stroke symptoms Dermatological: negative for rash ENT: negative for - epistaxis Skin: Negative. Gastrointestinal: no abdominal pain, change in bowel habits, or black or bloody stools Genito-Urinary: no dysuria, trouble voiding, or hematuria Hematological and Lymphatic: negative for - bleeding problems or blood clots Musculoskeletal: negative for - gait disturbance Remaining ROS negative. Physical Exam: Blood  pressure 183/92, pulse 76, temperature 98.5 F (36.9 C), temperature source Oral, resp. rate 18, height 5\' 9"  (1.753 m), weight 205 lb 3.2 oz (93.078 kg), SpO2 97.00%. ECOG:  General appearance: alert, cooperative, appears stated age and no distress Head: Normocephalic, without obvious abnormality, atraumatic Neck: no adenopathy, supple, symmetrical, trachea midline and thyroid not enlarged, symmetric, no tenderness/mass/nodules Lymph nodes: Cervical, supraclavicular, and axillary nodes normal. Heart:regular rate and rhythm, S1, S2 normal, no murmur, click, rub or gallop Lung:chest clear, no wheezing, rales, normal symmetric air entry Abdomin: soft, non-tender, without masses or organomegaly EXT:no deformities present, trace edema   Lab Results: Lab Results  Component Value Date   WBC 6.7 05/06/2013   HGB 12.8* 05/06/2013   HCT 38.1* 05/06/2013   MCV 99.2* 05/06/2013   PLT 158 05/06/2013     Chemistry      Component Value Date/Time   NA 142 04/08/2013 1440   NA 146* 04/08/2013 1002   NA 142 09/02/2011 0936   K 3.8 04/08/2013 1440   K 3.9 04/08/2013 1002  K 4.1 09/02/2011 0936   CL 107 04/08/2013 1440   CL 106 01/29/2013 0900   CL 98 09/02/2011 0936   CO2 26 04/08/2013 1440   CO2 26 04/08/2013 1002   CO2 29 09/02/2011 0936   BUN 18 04/08/2013 1440   BUN 17.4 04/08/2013 1002   BUN 17 09/02/2011 0936   CREATININE 1.28 04/08/2013 2032   CREATININE 1.3 04/08/2013 1002   CREATININE 0.8 09/02/2011 0936      Component Value Date/Time   CALCIUM 8.8 04/08/2013 1440   CALCIUM 9.4 04/08/2013 1002   CALCIUM 8.5 09/02/2011 0936   ALKPHOS 77 04/08/2013 1002   ALKPHOS 59 04/08/2012 0850   ALKPHOS 72 09/02/2011 0936   AST 20 04/08/2013 1002   AST 21 04/08/2012 0850   AST 21 09/02/2011 0936   ALT 11 04/08/2013 1002   ALT 14 04/08/2012 0850   ALT 17 09/02/2011 0936   BILITOT 0.64 04/08/2013 1002   BILITOT 0.6 04/08/2012 0850   BILITOT 0.60 09/02/2011 0936     Radiological Studies: 1. CT scan of chest, abdomen and pelvis  with IV contrast on 01/01/2011 were negative for evidence of recurrent lymphoma.  2. PET scan from 08/22/2010 showed no evidence for recurrent lymphoma.  3. CT scan of chest, abdomen and pelvis with IV contrast on 01/01/2011 showed no evidence for lymphoma.  4. CT scan of abdomen and pelvis on 07/01/2011 showed interval development of peritoneal disease worrisome for recurrence of lymphoma. There was new right external iliac lymph node pathologically enlarged and worrisome for recurrent lymphoma. There was some stable appearance of mild soft tissue stranding surrounding the celiac trunk and superior mesenteric artery.  5. Chest x-ray, 2 view, from 09/02/2011 showed no acute findings.  6. CT scan of abdomen and pelvis with IV contrast on 09/02/2011 showed interval progression of lymphadenopathy in the abdomen and pelvis.  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 The 1.1 x 1.7 cm omental nodule which was measured on the prior study of 07/01/2011 now measures  4.1 x 2.4 cm. A 2nd discrete soft tissue nodule in the omentum which previously measured 1.1 x 1.5 cm now measures 2.2 x 3.0 cm.  7. CT scan of abdomen and pelvis with IV contrast on 01/08/2012 showed no residual measurable mass along the anterior aspect of the right diaphragm at the site of the previously demonstrated 4.9 x 2.1 cm nodule. There is no residual measurable gastrohepatic or celiac lymphadenopathy. There is some soft tissue stranding around the proximal abdominal aorta, celiac trunk and superior mesenteric artery as noted on images 27-34. Pelvic lymphadenopathy is nearly completely resolved. A right external iliac node measuring 6 mm on image 66 previously measured 9 mm. Another node which previously measured 17 mm short axis now measures 8 mm on image 70. There is no residual inguinal adenopathy. The spleen remains  normal in size, demonstrates no focal abnormalities. There are no suspect osseous findings. There is no residual mass at the left  posterior costophrenic angle. The final impression was near complete response to therapy in the lymphadenopathy and omental disease with some  minimal residual measurable disease as previously noted. There was an enlarged left inguinal hernia containing a knuckle of sigmoid colon with  no evidence for incarceration or bowel obstruction. There was a nonobstructing left renal calculus.  8. CT scan of abdomen and pelvis with IV contrast carried out on 06/19/2012 was compared with the scan of 01/08/2012 and showed stable plaque-like soft tissue density in the upper abdominal retroperitoneum,  which had the appearance of treated lymphoma. No new or progressive disease within the abdomen or pelvis was noted. There is a stable incidental finding of a small left inguinal hernia and diverticulosis.  9. Chest x-ray, 2-view, from 09/07/2012 showed low lung volumes with streaky basilar atelectasis.  10. CT scan of abdomen and pelvis with IV contrast from 11/30/2012 compared with the prior CT scans from 06/19/2012 showed no significant changes and no evidence of recurrent lymphoma or other acute findings. 11. Nuclear Myocardial scan (04/09/2013) Lexiscan Sestamibi: Clinically negative, electrically negative for ischemia. MIBI scan with evidence of a ischemia in the inferolateral wall and mild ischemia in the inferior wall. Overall region small to medium in size. LVEF on gating calculated  at 54%. 12. CT scan of abdomen and pel is with IV contrast   When compared to 11/30/2012 showed no evidence of lymphomatous involvement in the chest. No evidence of lymphomatous involvement in the abdomen/pelvis and mildly thick-walled bladder, correlate for cystitis.  Impression and Plan: 1. Mantle cell lymphoma, now in remission.   -- We reviewed the CT of abdomen and pelvis as noted above.  His disease is now in remission.  We balanced  continuing his chemotherapy and its benefit on controlling his mantle cell lymphoma with the  risk of his ongoing chest discomfort that he associates with his therapy.  We reviewed his nuclear myocardial scans and EKG and cardiac cath report.  We will provide a brief holiday from his therapy (he received 18 cycles) given his recent chest discomfort.  He voiced understanding the risk of disease reoccurrence.  We will see him monthly and repeat scans every 2-3 months to assess for reoccurrence.   2. Chest pain, intermittent.  His EKG and nuclear myocardial scan is as noted above. His recent cardiac catherization (04/13/2013) revealed severe native vessel disease two of three grafts occluded. The LIMA to the LAD has moderate graft stenosis. Preserved EF but a poor candidate for CABG.  3. Follow-up.  RTC in one month with chemistries, complete blood count, and LDH.   Spent more than half the time coordinating care.   Shirlyn Savin, MD 05/06/2013  5:00 pm

## 2013-05-11 ENCOUNTER — Ambulatory Visit (INDEPENDENT_AMBULATORY_CARE_PROVIDER_SITE_OTHER): Payer: Medicare Other | Admitting: Cardiology

## 2013-05-11 ENCOUNTER — Encounter: Payer: Medicare Other | Admitting: Cardiology

## 2013-05-11 ENCOUNTER — Encounter: Payer: Self-pay | Admitting: Cardiology

## 2013-05-11 VITALS — BP 120/64 | HR 66 | Ht 69.0 in | Wt 207.0 lb

## 2013-05-11 DIAGNOSIS — I10 Essential (primary) hypertension: Secondary | ICD-10-CM

## 2013-05-11 DIAGNOSIS — I2510 Atherosclerotic heart disease of native coronary artery without angina pectoris: Secondary | ICD-10-CM

## 2013-05-11 DIAGNOSIS — C37 Malignant neoplasm of thymus: Secondary | ICD-10-CM

## 2013-05-11 DIAGNOSIS — I739 Peripheral vascular disease, unspecified: Secondary | ICD-10-CM

## 2013-05-11 NOTE — Progress Notes (Signed)
The patient presents for followup of his known coronary disease. He is being treated for mantle cell lymphoma. He recently had some chest discomfort and was hospitalized. There was some mild inferior lateral and inferior ischemia on stress perfusion imaging. I saw family brought him back for cardiac catheterization. This demonstrated LAD proximal 90% stenosis, mid long severe diffuse disease. Ostial disease and a moderate first diagonal. Ostial disease in the second diagonal. The nondominant right coronary with 90% stenosis. LIMA to the LAD had 70% stenosis in the body of it. Vein graft to the OM and PDA was occluded. Vein graft to the diagonal is occluded. We decided to manage him medically. Since that time he has been back to see the oncologist and has been told that he does not need any current therapy for his lymphoma. He says his chest discomfort is no longer bothering him. His wife says he sleeps a lot and doesn't do very much of what is limited level of activity he denies any overt symptoms such as chest pressure, neck or arm discomfort. He's not having any palpitations, presyncope or syncope. He has had no PND or orthopnea.   Allergies  Allergen Reactions  . Atorvastatin     Incredible dizziness    Current Outpatient Prescriptions  Medication Sig Dispense Refill  . acyclovir (ZOVIRAX) 400 MG tablet Take 400 mg by mouth 2 (two) times daily.      Marland Kitchen amLODipine (NORVASC) 5 MG tablet Take 1 tablet (5 mg total) by mouth daily.  30 tablet  6  . aspirin 81 MG tablet Take 81 mg by mouth every other day.      Marland Kitchen atenolol (TENORMIN) 50 MG tablet Take 50 mg by mouth daily.      . citalopram (CELEXA) 20 MG tablet Take 20 mg by mouth daily.      . clopidogrel (PLAVIX) 75 MG tablet Take 75 mg by mouth daily.      . furosemide (LASIX) 20 MG tablet Take 20 mg by mouth daily as needed. For fluid.      . isosorbide mononitrate (IMDUR) 120 MG 24 hr tablet Take 120 mg by mouth daily.      Marland Kitchen  lidocaine-prilocaine (EMLA) cream Apply 1 application topically See admin instructions. Apply to port 1 hour before treatment. Cover site with plastic wrap      . naproxen sodium (ANAPROX) 220 MG tablet Take 440 mg by mouth 2 (two) times daily as needed. For pain.      . nitroGLYCERIN (NITROSTAT) 0.4 MG SL tablet Place 0.4 mg under the tongue every 5 (five) minutes as needed for chest pain.      . pravastatin (PRAVACHOL) 40 MG tablet Take 80 mg by mouth at bedtime.      Marland Kitchen PRESCRIPTION MEDICATION Inject into the vein every 14 (fourteen) days. Velcade and Cytoxan.      . sulfamethoxazole-trimethoprim (BACTRIM DS) 800-160 MG per tablet Take 1 tablet by mouth every Monday, Wednesday, and Friday.       No current facility-administered medications for this visit.   Facility-Administered Medications Ordered in Other Visits  Medication Dose Route Frequency Provider Last Rate Last Dose  . sodium chloride 0.9 % injection 10 mL  10 mL Intracatheter PRN Samul Dada, MD   10 mL at 08/03/12 1419    Past Medical History  Diagnosis Date  . Hypertension   . CAD 11/12/2007    a. s/p CABG;  b. Cath 7/11 Patent LIMA to the LAD.  Previously known occlusion of a saphenous vein graft to diagonal and sequential to obtuse marginals. Severe diffuse native obtuse marginal and diagonal branch vessel disease. Preserved ejection fraction.;  c. 03/2013 Lexi CL: mild inflat and inf ischemia, EF 54%->initial med rx.  Marland Kitchen HYPERLIPIDEMIA 11/12/2007  . HYPERTENSION 11/12/2007  . PERIPHERAL VASCULAR DISEASE 11/12/2007  . ARTHRITIS 11/12/2007  . BENIGN PROSTATIC HYPERTROPHY, HX OF 11/12/2007  . Edema 10/17/2010  . INGUINAL HERNIAS, BILATERAL 11/10/2006  . Obesity, unspecified 07/05/2009  . SLEEP APNEA 11/12/2007  . History of PTCA 2005  . Osteoarthritis   . Carotid stenosis, bilateral   . Nephrolithiasis   . thymus ca dx'd 2003    Radiation comp 2003  . NHL (non-Hodgkin's lymphoma) dx'd 2008    Chemo comp 10/2010; rituxin comp  10/2010  . 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 MANTLE CELL LYMPHOMA INTRA-ABDOMINAL LYMPH NODES 11/12/2007    Past Surgical History  Procedure Laterality Date  . Coronary artery bypass graft    . Cardiac catheterization  08/2009, 02/2010    ROS:  As stated in the HPI and negative for all other systems.  PHYSICAL EXAM BP 120/64  Pulse 66  Ht 5\' 9"  (1.753 m)  Wt 207 lb (93.895 kg)  BMI 30.55 kg/m2  SpO2 91% GENERAL:  Well appearing HEENT:  Pupils equal round and reactive, fundi not visualized, oral mucosa unremarkable NECK:  No jugular venous distention, waveform within normal limits, carotid upstroke brisk and symmetric, no bruits, no thyromegaly LYMPHATICS:  No cervical, inguinal adenopathy LUNGS:  Clear to auscultation bilaterally BACK:  No CVA tenderness CHEST:  Well healed sternotomy scar. HEART:  PMI not displaced or sustained,S1 and S2 within normal limits, no S3, no S4, no clicks, no rubs, no murmurs ABD:  Flat, positive bowel sounds normal in frequency in pitch, no bruits, no rebound, no guarding, no midline pulsatile mass, no hepatomegaly, no splenomegaly EXT:  2 plus pulses throughout, mild right greater than left lower ext edema, no cyanosis no clubbing SKIN:  Rash upper chest,  no nodules NEURO:  Cranial nerves II through XII grossly intact, motor grossly intact throughout PSYCH:  Cognitively intact, oriented to person place and time   ASSESSMENT AND PLAN  CAD -  He has coronary anatomy as described. He has no new symptoms. No change in therapy or further intervention as needed.  HYPERTENSION -  The blood pressure is at target. No change in medications is indicated. We will continue with therapeutic lifestyle changes (TLC).   HYPERLIPIDEMIA -  Although he is not on target statin according to current guidelines given his ongoing medical problems I will leave him on pravastatin.  EDEMA - This is at baseline.  No change in therapy is indicated.

## 2013-05-11 NOTE — Patient Instructions (Addendum)
The current medical regimen is effective;  continue present plan and medications.  Follow up in 6 months with Dr Hochrein.  You will receive a letter in the mail 2 months before you are due.  Please call us when you receive this letter to schedule your follow up appointment.  

## 2013-06-03 ENCOUNTER — Encounter: Payer: Self-pay | Admitting: Internal Medicine

## 2013-06-03 ENCOUNTER — Other Ambulatory Visit (HOSPITAL_BASED_OUTPATIENT_CLINIC_OR_DEPARTMENT_OTHER): Payer: Medicare Other | Admitting: Lab

## 2013-06-03 ENCOUNTER — Encounter (INDEPENDENT_AMBULATORY_CARE_PROVIDER_SITE_OTHER): Payer: Self-pay

## 2013-06-03 ENCOUNTER — Ambulatory Visit (HOSPITAL_BASED_OUTPATIENT_CLINIC_OR_DEPARTMENT_OTHER): Payer: Medicare Other | Admitting: Internal Medicine

## 2013-06-03 ENCOUNTER — Telehealth: Payer: Self-pay | Admitting: Internal Medicine

## 2013-06-03 ENCOUNTER — Telehealth: Payer: Self-pay | Admitting: *Deleted

## 2013-06-03 VITALS — BP 173/87 | HR 65 | Temp 97.9°F | Resp 18 | Ht 69.0 in | Wt 204.8 lb

## 2013-06-03 DIAGNOSIS — C8313 Mantle cell lymphoma, intra-abdominal lymph nodes: Secondary | ICD-10-CM

## 2013-06-03 DIAGNOSIS — R0789 Other chest pain: Secondary | ICD-10-CM

## 2013-06-03 DIAGNOSIS — I2510 Atherosclerotic heart disease of native coronary artery without angina pectoris: Secondary | ICD-10-CM

## 2013-06-03 LAB — CBC WITH DIFFERENTIAL/PLATELET
Eosinophils Absolute: 0.4 10*3/uL (ref 0.0–0.5)
LYMPH%: 24.5 % (ref 14.0–49.0)
MONO#: 0.8 10*3/uL (ref 0.1–0.9)
NEUT#: 2.8 10*3/uL (ref 1.5–6.5)
Platelets: 160 10*3/uL (ref 140–400)
RBC: 3.91 10*6/uL — ABNORMAL LOW (ref 4.20–5.82)
WBC: 5.3 10*3/uL (ref 4.0–10.3)
lymph#: 1.3 10*3/uL (ref 0.9–3.3)
nRBC: 0 % (ref 0–0)

## 2013-06-03 LAB — COMPREHENSIVE METABOLIC PANEL (CC13)
ALT: 10 U/L (ref 0–55)
Albumin: 3.7 g/dL (ref 3.5–5.0)
Anion Gap: 12 mEq/L — ABNORMAL HIGH (ref 3–11)
CO2: 21 mEq/L — ABNORMAL LOW (ref 22–29)
Calcium: 9.1 mg/dL (ref 8.4–10.4)
Chloride: 110 mEq/L — ABNORMAL HIGH (ref 98–109)
Creatinine: 1.3 mg/dL (ref 0.7–1.3)
Potassium: 4 mEq/L (ref 3.5–5.1)
Sodium: 143 mEq/L (ref 136–145)
Total Protein: 6.5 g/dL (ref 6.4–8.3)

## 2013-06-03 NOTE — Progress Notes (Signed)
[facility] OFFICE PROGRESS NOTE  Martin Hector, MD [address] [place] [address]  DIAGNOSIS: MANTLE CELL LYMPHOMA INTRA-ABDOMINAL LYMPH NODES - Plan: CBC with Differential, CBC with Differential, Comprehensive metabolic panel  Midsternal chest pain  CAD  Chief Complaint  Patient presents with  . Mantal cell lymphoma intra-abdominal lymph nodes   PRINCIPLE DIAGNOSIS: Mantle cell lymphoma diagnosed in March 2008 with positive bone marrow initially on 12/03/2006 and then negative bone marrow after treatment in October 2009. As stated, the patient presented with obstructive liver abnormalities requiring ERCP and non metal stent  placement by Dr. Melvia Heaps. The stent was subsequently removed in October 2008.   PRIOR THERAPY: The patient was treated with 8 cycles of Rituxan, Cytoxan, vincristine and Decadron in combination with Neulasta from 12/19/2006 through 05/15/2007. The patient then received maintenance Rituxan from July 16, 2007 through February 01, 2010. While on Rituxan, he had a recurrence of his disease by CT scan carried out on 01/24/2010. Martin Morris then received treatment with bendamustine and Rituxan in combination with Neulasta for 6 cycles from 02/19/2010 through 07/26/2010. We have a prior PET scan from 08/22/2010 and CT scans of chest, abdomen, and pelvis from 01/01/2011 that showed no evidence of disease. He had been off treatment since December 2011 and had been doing well without any symptoms until the CT scan of the abdomen and pelvis on 07/01/2011 showed signs of recurrence. CT scans of abdomen and pelvis with IVC on 09/02/11 showed further progression. Rituxan, subcutaneous Velcade, intravenous Cytoxan and Decadron were initiated on 10/15/2011. CT scans of the abdomen and pelvis carried out on 01/08/2012 showed a near complete response to therapy. CT scan of the abdomen and pelvis with IV contrast carried out on 06/19/2012 showed stable plaque-like  soft tissue density in the upper abdominal retroperitoneum compared with the CT scan of 01/08/2012. CT scan of the abdomen and pelvis with IV contrast carried out on 11/30/2012 showed no evidence of recurrent lymphoma.   CURRENT THERAPY: As of 12/04/2012, treatment program will be as follows:  -Velcade 2.75 mg subcu.  -Cytoxan 400 mg IV.  -Decadron 20 mg IV.  The above drugs will be given every 2 weeks.  -Rituxan 800 mg IV every 4 weeks.  Previously, beginning on 10/15/2011, Velcade, Cytoxan, and Decadron were  being administered weekly, and Rituxan was being administered every 3 weeks.   INTERIM HISTORY: Patient is in today for a followup visit prior to proceeding with his next scheduled cycle of Rituxan, Velcade, Cytoxan and dexamethasone for treatment of his relapsed mantle cell lymphoma, originally diagnosed March 2008. Last restaging CT scans were done in April of 2014 and were negative for any evidence of disease recurrence. He was seen last month for chemotherapy but had chest pain and it was held. He underwent a cardiac cath which revealed two of three native vessel ischemic disease on 04/13/2013 but he is a poor surgical candidate for a re-do CABG. Overall he's tolerated his treatment without difficulty. Today, he reports his chest discomfort happens primarily when he receives his chemotherapy. He states that it is much improved since he has been off his therapy. He has had interim scans as noted below. His wife is accompanying him today. He states that he helped finish area decorate and his kitchen. He reports a good appetite and his weight is stable. He denies any chest pain or emergency room visits or hospitalizations since his last visit.  MEDICAL HISTORY: Past Medical History  Diagnosis Date  .  Hypertension   . CAD 11/12/2007    a. s/p CABG;  b. Cath 7/11 Patent LIMA to the LAD. Previously known occlusion of a saphenous vein graft to diagonal and sequential to obtuse marginals. Severe  diffuse native obtuse marginal and diagonal branch vessel disease. Preserved ejection fraction.;  c. 03/2013 Lexi CL: mild inflat and inf ischemia, EF 54%->initial med rx.  Marland Kitchen HYPERLIPIDEMIA 11/12/2007  . HYPERTENSION 11/12/2007  . PERIPHERAL VASCULAR DISEASE 11/12/2007  . ARTHRITIS 11/12/2007  . BENIGN PROSTATIC HYPERTROPHY, HX OF 11/12/2007  . Edema 10/17/2010  . INGUINAL HERNIAS, BILATERAL 11/10/2006  . Obesity, unspecified 07/05/2009  . SLEEP APNEA 11/12/2007  . History of PTCA 2005  . Osteoarthritis   . Carotid stenosis, bilateral   . Nephrolithiasis   . thymus ca dx'd 2003    Radiation comp 2003  . NHL (non-Hodgkin's lymphoma) dx'd 2008    Chemo comp 10/2010; rituxin comp 10/2010  . 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 MANTLE CELL LYMPHOMA INTRA-ABDOMINAL LYMPH NODES 11/12/2007    INTERIM HISTORY: has CANCER, THYMUS; MANTLE CELL LYMPHOMA INTRA-ABDOMINAL LYMPH NODES; HYPERLIPIDEMIA; Obesity, unspecified; HYPERTENSION; CAD; PERIPHERAL VASCULAR DISEASE; INGUINAL HERNIAS, BILATERAL; NEPHROLITHIASIS, RECURRENT; ARTHRITIS; SLEEP APNEA; BENIGN PROSTATIC HYPERTROPHY, HX OF; EDEMA; Edema; Bruising; Ulcer of lower limb, unspecified; and Midsternal chest pain on his problem list.    ALLERGIES:  is allergic to atorvastatin.  MEDICATIONS: has a current medication list which includes the following prescription(s): acyclovir, amlodipine, aspirin, atenolol, citalopram, clopidogrel, furosemide, isosorbide mononitrate, lidocaine-prilocaine, naproxen sodium, nitroglycerin, pravastatin, PRESCRIPTION MEDICATION, and sulfamethoxazole-trimethoprim, and the following Facility-Administered Medications: sodium chloride.  SURGICAL HISTORY:  Past Surgical History  Procedure Laterality Date  . Coronary artery bypass graft    . Cardiac catheterization  08/2009, 02/2010   REVIEW OF SYSTEMS:   Constitutional: Denies fevers, chills or abnormal weight loss Eyes: Denies blurriness of vision Ears, nose, mouth, throat, and face:  Denies mucositis or sore throat Respiratory: Denies cough, dyspnea or wheezes Cardiovascular: Denies palpitation, chest discomfort or lower extremity swelling Gastrointestinal:  Denies nausea, heartburn or change in bowel habits Skin: Denies abnormal skin rashes Lymphatics: Denies new lymphadenopathy or easy bruising Neurological:Denies numbness, tingling or new weaknesses Behavioral/Psych: Mood is stable, no new changes  All other systems were reviewed with the patient and are negative.  PHYSICAL EXAMINATION: ECOG PERFORMANCE STATUS: 0 - Asymptomatic  Blood pressure 173/87, pulse 65, temperature 97.9 F (36.6 C), temperature source Oral, resp. rate 18, height 5\' 9"  (1.753 m), weight 204 lb 12.8 oz (92.897 kg), SpO2 97.00%.  GENERAL:alert, no distress and comfortable SKIN: skin color, texture, turgor are normal, no rashes or significant lesions; + bruises on area of extensor surfaces of upper extremities bilaterally.  EYES: normal, Conjunctiva are pink and non-injected, sclera clear OROPHARYNX:no exudate, no erythema and lips, buccal mucosa, and tongue normal  NECK: supple, thyroid normal size, non-tender, without nodularity LYMPH:  no palpable lymphadenopathy in the cervical, axillary or supraclavicular LUNGS: clear to auscultation and percussion with normal breathing effort HEART: regular rate & rhythm and no murmurs and no lower extremity edema ABDOMEN:abdomen soft, non-tender and normal bowel sounds Musculoskeletal:no cyanosis of digits and no clubbing  NEURO: alert & oriented x 3 with fluent speech, no focal motor/sensory deficits   LABORATORY DATA: Results for orders placed in visit on 06/03/13 (from the past 48 hour(s))  CBC WITH DIFFERENTIAL     Status: Abnormal   Collection Time    06/03/13  9:10 AM      Result Value Range   WBC 5.3  4.0 - 10.3 10e3/uL   NEUT# 2.8  1.5 - 6.5 10e3/uL   HGB 12.8 (*) 13.0 - 17.1 g/dL   HCT 16.1  09.6 - 04.5 %   Platelets 160  140 - 400  10e3/uL   MCV 99.7 (*) 79.3 - 98.0 fL   MCH 32.7  27.2 - 33.4 pg   MCHC 32.8  32.0 - 36.0 g/dL   RBC 4.09 (*) 8.11 - 9.14 10e6/uL   RDW 12.8  11.0 - 14.6 %   lymph# 1.3  0.9 - 3.3 10e3/uL   MONO# 0.8  0.1 - 0.9 10e3/uL   Eosinophils Absolute 0.4  0.0 - 0.5 10e3/uL   Basophils Absolute 0.1  0.0 - 0.1 10e3/uL   NEUT% 53.3  39.0 - 75.0 %   LYMPH% 24.5  14.0 - 49.0 %   MONO% 14.5 (*) 0.0 - 14.0 %   EOS% 6.6  0.0 - 7.0 %   BASO% 1.1  0.0 - 2.0 %   nRBC 0  0 - 0 %  COMPREHENSIVE METABOLIC PANEL (CC13)     Status: Abnormal   Collection Time    06/03/13  9:10 AM      Result Value Range   Sodium 143  136 - 145 mEq/L   Potassium 4.0  3.5 - 5.1 mEq/L   Chloride 110 (*) 98 - 109 mEq/L   CO2 21 (*) 22 - 29 mEq/L   Glucose 119  70 - 140 mg/dl   BUN 78.2  7.0 - 95.6 mg/dL   Creatinine 1.3  0.7 - 1.3 mg/dL   Total Bilirubin 2.13  0.20 - 1.20 mg/dL   Alkaline Phosphatase 72  40 - 150 U/L   AST 19  5 - 34 U/L   ALT 10  0 - 55 U/L   Total Protein 6.5  6.4 - 8.3 g/dL   Albumin 3.7  3.5 - 5.0 g/dL   Calcium 9.1  8.4 - 08.6 mg/dL   Anion Gap 12 (*) 3 - 11 mEq/L  LACTATE DEHYDROGENASE (CC13)     Status: None   Collection Time    06/03/13  9:11 AM      Result Value Range   LDH 225  125 - 245 U/L       Labs:  Lab Results  Component Value Date   WBC 5.3 06/03/2013   HGB 12.8* 06/03/2013   HCT 39.0 06/03/2013   MCV 99.7* 06/03/2013   PLT 160 06/03/2013   NEUTROABS 2.8 06/03/2013      Chemistry      Component Value Date/Time   NA 143 06/03/2013 0910   NA 142 04/08/2013 1440   NA 142 09/02/2011 0936   K 4.0 06/03/2013 0910   K 3.8 04/08/2013 1440   K 4.1 09/02/2011 0936   CL 107 04/08/2013 1440   CL 106 01/29/2013 0900   CL 98 09/02/2011 0936   CO2 21* 06/03/2013 0910   CO2 26 04/08/2013 1440   CO2 29 09/02/2011 0936   BUN 19.1 06/03/2013 0910   BUN 18 04/08/2013 1440   BUN 17 09/02/2011 0936   CREATININE 1.3 06/03/2013 0910   CREATININE 1.28 04/08/2013 2032   CREATININE 0.8 09/02/2011 0936       Component Value Date/Time   CALCIUM 9.1 06/03/2013 0910   CALCIUM 8.8 04/08/2013 1440   CALCIUM 8.5 09/02/2011 0936   ALKPHOS 72 06/03/2013 0910   ALKPHOS 59 04/08/2012 0850   ALKPHOS 72 09/02/2011 0936   AST 19  06/03/2013 0910   AST 21 04/08/2012 0850   AST 21 09/02/2011 0936   ALT 10 06/03/2013 0910   ALT 14 04/08/2012 0850   ALT 17 09/02/2011 0936   BILITOT 0.64 06/03/2013 0910   BILITOT 0.6 04/08/2012 0850   BILITOT 0.60 09/02/2011 0936     RADIOGRAPHIC STUDIES:  1. CT scan of chest, abdomen and pelvis with IV contrast on 01/01/2011 were negative for evidence of recurrent lymphoma.  2. PET scan from 08/22/2010 showed no evidence for recurrent lymphoma.  3. CT scan of chest, abdomen and pelvis with IV contrast on 01/01/2011 showed no evidence for lymphoma.  4. CT scan of abdomen and pelvis on 07/01/2011 showed interval development of peritoneal disease worrisome for recurrence of lymphoma. There was new right external iliac lymph node pathologically enlarged and worrisome for recurrent lymphoma. There was some stable appearance of mild soft tissue stranding surrounding the celiac trunk and superior mesenteric artery.  5. Chest x-ray, 2 view, from 09/02/2011 showed no acute findings.  6. CT scan of abdomen and pelvis with IV contrast on 09/02/2011 showed interval progression of lymphadenopathy in the abdomen and pelvis. 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 The 1.1 x 1.7 cm omental nodule which was measured on the prior study of 07/01/2011 now measures  4.1 x 2.4 cm. A 2nd discrete soft tissue nodule in the omentum which previously measured 1.1 x 1.5 cm now measures 2.2 x 3.0 cm.  7. CT scan of abdomen and pelvis with IV contrast on 01/08/2012 showed no residual measurable mass along the anterior aspect of the right diaphragm at the site of the previously demonstrated 4.9 x 2.1 cm nodule. There is no residual measurable gastrohepatic or celiac lymphadenopathy. There is some soft tissue stranding around the  proximal abdominal aorta, celiac trunk and superior mesenteric artery as noted on images 27-34. Pelvic lymphadenopathy is nearly completely resolved. A right external iliac node measuring 6 mm on image 66 previously measured 9 mm. Another node which previously measured 17 mm short axis now measures 8 mm on image 70. There is no residual inguinal adenopathy. The spleen remains  normal in size, demonstrates no focal abnormalities. There are no suspect osseous findings. There is no residual mass at the left posterior costophrenic angle. The final impression was near complete response to therapy in the lymphadenopathy and omental disease with some  minimal residual measurable disease as previously noted. There was an enlarged left inguinal hernia containing a knuckle of sigmoid colon with  no evidence for incarceration or bowel obstruction. There was a nonobstructing left renal calculus.  8. CT scan of abdomen and pelvis with IV contrast carried out on 06/19/2012 was compared with the scan of 01/08/2012 and showed stable plaque-like soft tissue density in the upper abdominal retroperitoneum, which had the appearance of treated lymphoma. No new or progressive disease within the abdomen or pelvis was noted. There is a stable incidental finding of a small left inguinal hernia and diverticulosis.  9. Chest x-ray, 2-view, from 09/07/2012 showed low lung volumes with streaky basilar atelectasis.  10. CT scan of abdomen and pelvis with IV contrast from 11/30/2012 compared with the prior CT scans from 06/19/2012 showed no significant changes and no evidence of recurrent lymphoma or other acute findings.  11. Nuclear Myocardial scan (04/09/2013) Lexiscan Sestamibi: Clinically negative, electrically negative for ischemia. MIBI scan with evidence of a ischemia in the inferolateral wall and mild ischemia in the inferior wall. Overall region small to medium in size. LVEF on gating  calculated  at 54%.  12. CT scan of abdomen  and pelvis with IV contrast When compared to 11/30/2012 showed no evidence of lymphomatous involvement in the chest.  No evidence of lymphomatous involvement in the abdomen/pelvis and mildly thick-walled bladder, correlate for cystitis.   ASSESSMENT: Martin Morris 79 y.o. male with a history of MANTLE CELL LYMPHOMA INTRA-ABDOMINAL LYMPH NODES - Plan: CBC with Differential, CBC with Differential, Comprehensive metabolic panel  Midsternal chest pain  CAD   PLAN:  1. Mantle cell lymphoma, now in remission.  -- Last visit , we reviewed the CT of abdomen and pelvis as noted above. His disease is now in remission. We balanced continuing his chemotherapy and its benefit on controlling his mantle cell lymphoma with the risk of his ongoing chest discomfort that he associates with his therapy. We reviewed his nuclear myocardial scans and EKG and cardiac cath report. We have provided him a brief holiday from his therapy (he received 18 cycles) given his recent chest discomfort and he will resume his chemotherapy on Monday, October 13. He voiced agreement to continuing chemotherapy in the setting of his cardiac history.   We will see him monthly and repeat scans every 3 months to assess for reoccurrence.   --On Monday, October 13 he will receive Velcade 1.3 mg per meter square, Cytoxan 190 mg per meter square, Decadron 20 mg IV, rituximab 375 mg per meter square.  He will return on October 27 chemotherapy and a quick prechemotherapy visit. He has been provided handouts detailing each chemotherapy agent including each agent's indications, side effects,etc.  He understands a risk of marrow suppression and if he has a fever of 100.5 or greater to call the M.D. on call and/or report to the emergency room.  He understood these risks and benefits as detailed above and decided to continue his chemotherapy.   2. Chest pain, intermittent. His EKG and nuclear myocardial scan is as noted above. His recent cardiac  catherization (04/13/2013) revealed severe native vessel disease two of three grafts occluded. The LIMA to the LAD has moderate graft stenosis. Preserved EF but a poor candidate for CABG. He is asymptomatic for chest pain presently.  He is handling his basic and intermediate ADLs without difficulty.    3. Follow-up. RTC on October 27 with chemistries, complete blood count, and chemotherapy.   All questions were answered. The patient knows to call the clinic with any problems, questions or concerns. We can certainly see the patient much sooner if necessary. Was provided and after visit summary.  I spent 15 minutes counseling the patient face to face. The total time spent in the appointment was 25 minutes.    Dayleen Beske, MD 06/03/2013 10:21 AM

## 2013-06-03 NOTE — Telephone Encounter (Signed)
gv andprinted appt sched and avs for pt for OCT emailed MW to add tx...  °

## 2013-06-03 NOTE — Telephone Encounter (Signed)
dPer staff message and POF I have scheduled appts.  JMW

## 2013-06-07 ENCOUNTER — Ambulatory Visit (HOSPITAL_BASED_OUTPATIENT_CLINIC_OR_DEPARTMENT_OTHER): Payer: Medicare Other

## 2013-06-07 ENCOUNTER — Other Ambulatory Visit (HOSPITAL_BASED_OUTPATIENT_CLINIC_OR_DEPARTMENT_OTHER): Payer: Medicare Other | Admitting: Lab

## 2013-06-07 VITALS — BP 138/73 | HR 54 | Temp 97.0°F | Resp 18

## 2013-06-07 DIAGNOSIS — C8313 Mantle cell lymphoma, intra-abdominal lymph nodes: Secondary | ICD-10-CM

## 2013-06-07 DIAGNOSIS — Z5111 Encounter for antineoplastic chemotherapy: Secondary | ICD-10-CM

## 2013-06-07 DIAGNOSIS — Z5112 Encounter for antineoplastic immunotherapy: Secondary | ICD-10-CM

## 2013-06-07 LAB — CBC WITH DIFFERENTIAL/PLATELET
EOS%: 6.6 % (ref 0.0–7.0)
LYMPH%: 27.3 % (ref 14.0–49.0)
MCH: 32.7 pg (ref 27.2–33.4)
MCV: 98.9 fL — ABNORMAL HIGH (ref 79.3–98.0)
MONO%: 11.3 % (ref 0.0–14.0)
Platelets: 180 10*3/uL (ref 140–400)
RBC: 3.7 10*6/uL — ABNORMAL LOW (ref 4.20–5.82)
RDW: 12.7 % (ref 11.0–14.6)
nRBC: 0 % (ref 0–0)

## 2013-06-07 MED ORDER — HEPARIN SOD (PORK) LOCK FLUSH 100 UNIT/ML IV SOLN
500.0000 [IU] | Freq: Once | INTRAVENOUS | Status: AC | PRN
  Administered 2013-06-07: 500 [IU]
  Filled 2013-06-07: qty 5

## 2013-06-07 MED ORDER — BORTEZOMIB CHEMO SQ INJECTION 3.5 MG (2.5MG/ML)
1.3000 mg/m2 | Freq: Once | INTRAMUSCULAR | Status: AC
  Administered 2013-06-07: 2.75 mg via SUBCUTANEOUS
  Filled 2013-06-07: qty 2.75

## 2013-06-07 MED ORDER — SODIUM CHLORIDE 0.9 % IJ SOLN
10.0000 mL | INTRAMUSCULAR | Status: DC | PRN
  Administered 2013-06-07: 10 mL
  Filled 2013-06-07: qty 10

## 2013-06-07 MED ORDER — ONDANSETRON 8 MG/NS 50 ML IVPB
INTRAVENOUS | Status: AC
  Filled 2013-06-07: qty 8

## 2013-06-07 MED ORDER — SODIUM CHLORIDE 0.9 % IV SOLN
Freq: Once | INTRAVENOUS | Status: AC
  Administered 2013-06-07: 09:00:00 via INTRAVENOUS

## 2013-06-07 MED ORDER — SODIUM CHLORIDE 0.9 % IV SOLN
190.0000 mg/m2 | Freq: Once | INTRAVENOUS | Status: AC
  Administered 2013-06-07: 400 mg via INTRAVENOUS
  Filled 2013-06-07: qty 20

## 2013-06-07 MED ORDER — DIPHENHYDRAMINE HCL 25 MG PO CAPS
25.0000 mg | ORAL_CAPSULE | Freq: Once | ORAL | Status: AC
  Administered 2013-06-07: 25 mg via ORAL

## 2013-06-07 MED ORDER — ONDANSETRON 8 MG/50ML IVPB (CHCC)
8.0000 mg | Freq: Once | INTRAVENOUS | Status: AC
  Administered 2013-06-07: 8 mg via INTRAVENOUS

## 2013-06-07 MED ORDER — SODIUM CHLORIDE 0.9 % IV SOLN
375.0000 mg/m2 | Freq: Once | INTRAVENOUS | Status: AC
  Administered 2013-06-07: 800 mg via INTRAVENOUS
  Filled 2013-06-07: qty 80

## 2013-06-07 MED ORDER — ACETAMINOPHEN 325 MG PO TABS
ORAL_TABLET | ORAL | Status: AC
  Filled 2013-06-07: qty 2

## 2013-06-07 MED ORDER — DIPHENHYDRAMINE HCL 25 MG PO CAPS
ORAL_CAPSULE | ORAL | Status: AC
  Filled 2013-06-07: qty 1

## 2013-06-07 MED ORDER — DEXAMETHASONE SODIUM PHOSPHATE 20 MG/5ML IJ SOLN
INTRAMUSCULAR | Status: AC
  Filled 2013-06-07: qty 5

## 2013-06-07 MED ORDER — DEXAMETHASONE SODIUM PHOSPHATE 4 MG/ML IJ SOLN
20.0000 mg | Freq: Once | INTRAMUSCULAR | Status: AC
  Administered 2013-06-07: 20 mg via INTRAVENOUS

## 2013-06-07 MED ORDER — ACETAMINOPHEN 325 MG PO TABS
650.0000 mg | ORAL_TABLET | Freq: Once | ORAL | Status: AC
  Administered 2013-06-07: 650 mg via ORAL

## 2013-06-07 NOTE — Patient Instructions (Signed)
[facility] Discharge Instructions for Patients Receiving Chemotherapy  Today you received the following chemotherapy agents:  Velcade, Cytoxan and Rituxan.  To help prevent nausea and vomiting after your treatment, we encourage you to take your nausea medication as ordered per MD.   If you develop nausea and vomiting that is not controlled by your nausea medication, call the clinic.   BELOW ARE SYMPTOMS THAT SHOULD BE REPORTED IMMEDIATELY:  *FEVER GREATER THAN 100.5 F  *CHILLS WITH OR WITHOUT FEVER  NAUSEA AND VOMITING THAT IS NOT CONTROLLED WITH YOUR NAUSEA MEDICATION  *UNUSUAL SHORTNESS OF BREATH  *UNUSUAL BRUISING OR BLEEDING  TENDERNESS IN MOUTH AND THROAT WITH OR WITHOUT PRESENCE OF ULCERS  *URINARY PROBLEMS  *BOWEL PROBLEMS  UNUSUAL RASH Items with * indicate a potential emergency and should be followed up as soon as possible.  Feel free to call the clinic you have any questions or concerns. The clinic phone number is [phone].

## 2013-06-16 ENCOUNTER — Encounter: Payer: Medicare Other | Admitting: Cardiology

## 2013-06-20 ENCOUNTER — Other Ambulatory Visit: Payer: Self-pay | Admitting: Oncology

## 2013-06-21 ENCOUNTER — Other Ambulatory Visit: Payer: Self-pay

## 2013-06-21 ENCOUNTER — Other Ambulatory Visit: Payer: Self-pay | Admitting: Internal Medicine

## 2013-06-21 ENCOUNTER — Ambulatory Visit: Payer: Medicare Other

## 2013-06-21 ENCOUNTER — Other Ambulatory Visit: Payer: Medicare Other | Admitting: Lab

## 2013-06-21 DIAGNOSIS — C8313 Mantle cell lymphoma, intra-abdominal lymph nodes: Secondary | ICD-10-CM

## 2013-06-21 MED ORDER — SULFAMETHOXAZOLE-TMP DS 800-160 MG PO TABS: 1.0000 | ORAL_TABLET | ORAL | Status: DC

## 2013-06-21 NOTE — Progress Notes (Signed)
Pt cancelled today's appt d/t transportation issues. POF sent to r/s ASAP, spoke w/Dr Chism and pt can see Adrena if that works better.

## 2013-06-23 ENCOUNTER — Telehealth: Payer: Self-pay | Admitting: *Deleted

## 2013-06-23 ENCOUNTER — Other Ambulatory Visit: Payer: Self-pay | Admitting: Medical Oncology

## 2013-06-23 NOTE — Telephone Encounter (Signed)
sw pt gv appt for 06/24/13 with labs@ 11:15am, ov@ 11:45am , and tx to follow....td

## 2013-06-23 NOTE — Telephone Encounter (Signed)
Per staff message and POF I have scheduled appts.  JMW  

## 2013-06-24 ENCOUNTER — Ambulatory Visit (HOSPITAL_BASED_OUTPATIENT_CLINIC_OR_DEPARTMENT_OTHER): Payer: Medicare Other | Admitting: Physician Assistant

## 2013-06-24 ENCOUNTER — Other Ambulatory Visit (HOSPITAL_BASED_OUTPATIENT_CLINIC_OR_DEPARTMENT_OTHER): Payer: Medicare Other | Admitting: Lab

## 2013-06-24 ENCOUNTER — Ambulatory Visit (HOSPITAL_BASED_OUTPATIENT_CLINIC_OR_DEPARTMENT_OTHER): Payer: Medicare Other

## 2013-06-24 ENCOUNTER — Other Ambulatory Visit: Payer: Self-pay | Admitting: Internal Medicine

## 2013-06-24 VITALS — BP 126/70 | HR 66 | Temp 96.8°F | Resp 18 | Ht 69.0 in | Wt 206.4 lb

## 2013-06-24 DIAGNOSIS — Z5111 Encounter for antineoplastic chemotherapy: Secondary | ICD-10-CM

## 2013-06-24 DIAGNOSIS — R079 Chest pain, unspecified: Secondary | ICD-10-CM

## 2013-06-24 DIAGNOSIS — C8313 Mantle cell lymphoma, intra-abdominal lymph nodes: Secondary | ICD-10-CM

## 2013-06-24 DIAGNOSIS — Z5112 Encounter for antineoplastic immunotherapy: Secondary | ICD-10-CM

## 2013-06-24 LAB — CBC WITH DIFFERENTIAL/PLATELET
BASO%: 1.5 % (ref 0.0–2.0)
EOS%: 8.5 % — ABNORMAL HIGH (ref 0.0–7.0)
LYMPH%: 30.7 % (ref 14.0–49.0)
MCH: 32.5 pg (ref 27.2–33.4)
MCHC: 32.9 g/dL (ref 32.0–36.0)
MONO#: 0.8 10*3/uL (ref 0.1–0.9)
NEUT%: 41.6 % (ref 39.0–75.0)
Platelets: 194 10*3/uL (ref 140–400)
RBC: 3.82 10*6/uL — ABNORMAL LOW (ref 4.20–5.82)
WBC: 4.7 10*3/uL (ref 4.0–10.3)
lymph#: 1.4 10*3/uL (ref 0.9–3.3)
nRBC: 0 % (ref 0–0)

## 2013-06-24 LAB — COMPREHENSIVE METABOLIC PANEL (CC13)
ALT: 10 U/L (ref 0–55)
AST: 18 U/L (ref 5–34)
Alkaline Phosphatase: 80 U/L (ref 40–150)
BUN: 24.1 mg/dL (ref 7.0–26.0)
Calcium: 8.7 mg/dL (ref 8.4–10.4)
Chloride: 108 mEq/L (ref 98–109)
Creatinine: 1.4 mg/dL — ABNORMAL HIGH (ref 0.7–1.3)

## 2013-06-24 MED ORDER — BORTEZOMIB CHEMO SQ INJECTION 3.5 MG (2.5MG/ML)
1.3000 mg/m2 | Freq: Once | INTRAMUSCULAR | Status: AC
  Administered 2013-06-24: 2.75 mg via SUBCUTANEOUS
  Filled 2013-06-24: qty 2.75

## 2013-06-24 MED ORDER — SODIUM CHLORIDE 0.9 % IJ SOLN
10.0000 mL | INTRAMUSCULAR | Status: DC | PRN
  Administered 2013-06-24: 10 mL
  Filled 2013-06-24: qty 10

## 2013-06-24 MED ORDER — ONDANSETRON 8 MG/50ML IVPB (CHCC)
8.0000 mg | Freq: Once | INTRAVENOUS | Status: AC
  Administered 2013-06-24: 8 mg via INTRAVENOUS

## 2013-06-24 MED ORDER — DEXAMETHASONE SODIUM PHOSPHATE 20 MG/5ML IJ SOLN
INTRAMUSCULAR | Status: AC
  Filled 2013-06-24: qty 5

## 2013-06-24 MED ORDER — HEPARIN SOD (PORK) LOCK FLUSH 100 UNIT/ML IV SOLN
500.0000 [IU] | Freq: Once | INTRAVENOUS | Status: AC | PRN
  Administered 2013-06-24: 500 [IU]
  Filled 2013-06-24: qty 5

## 2013-06-24 MED ORDER — SODIUM CHLORIDE 0.9 % IV SOLN
190.0000 mg/m2 | Freq: Once | INTRAVENOUS | Status: AC
  Administered 2013-06-24: 400 mg via INTRAVENOUS
  Filled 2013-06-24: qty 20

## 2013-06-24 MED ORDER — DEXAMETHASONE SODIUM PHOSPHATE 4 MG/ML IJ SOLN
20.0000 mg | Freq: Once | INTRAMUSCULAR | Status: AC
  Administered 2013-06-24: 20 mg via INTRAVENOUS

## 2013-06-24 MED ORDER — ONDANSETRON 8 MG/NS 50 ML IVPB
INTRAVENOUS | Status: AC
  Filled 2013-06-24: qty 8

## 2013-06-24 MED ORDER — SODIUM CHLORIDE 0.9 % IV SOLN
Freq: Once | INTRAVENOUS | Status: AC
  Administered 2013-06-24: 13:00:00 via INTRAVENOUS

## 2013-06-24 NOTE — Patient Instructions (Signed)
[facility] Discharge Instructions for Patients Receiving Chemotherapy  Today you received the following chemotherapy agents: Dexamethasone (steroid), Velcade, Cytoxan   To help prevent nausea and vomiting after your treatment, we encourage you to take your nausea medication as prescribed.   If you develop nausea and vomiting that is not controlled by your nausea medication, call the clinic.   BELOW ARE SYMPTOMS THAT SHOULD BE REPORTED IMMEDIATELY:  *FEVER GREATER THAN 100.5 F  *CHILLS WITH OR WITHOUT FEVER  NAUSEA AND VOMITING THAT IS NOT CONTROLLED WITH YOUR NAUSEA MEDICATION  *UNUSUAL SHORTNESS OF BREATH  *UNUSUAL BRUISING OR BLEEDING  TENDERNESS IN MOUTH AND THROAT WITH OR WITHOUT PRESENCE OF ULCERS  *URINARY PROBLEMS  *BOWEL PROBLEMS  UNUSUAL RASH Items with * indicate a potential emergency and should be followed up as soon as possible.  Feel free to call the clinic you have any questions or concerns. The clinic phone number is [phone].

## 2013-06-25 ENCOUNTER — Telehealth: Payer: Self-pay | Admitting: *Deleted

## 2013-06-25 NOTE — Telephone Encounter (Signed)
Per staff message and POF I have scheduled appts.  JMW  

## 2013-06-28 NOTE — Patient Instructions (Addendum)
Follow-up in 2 weeks

## 2013-06-28 NOTE — Progress Notes (Signed)
[facility] OFFICE PROGRESS NOTE  Martin Hector, MD [address] [place] [address]  DIAGNOSIS: MANTLE CELL LYMPHOMA INTRA-ABDOMINAL LYMPH NODES - Plan: Comprehensive metabolic panel, Lactate dehydrogenase  No chief complaint on file.  PRINCIPLE DIAGNOSIS: Mantle cell lymphoma diagnosed in March 2008 with positive bone marrow initially on 12/03/2006 and then negative bone marrow after treatment in October 2009. As stated, the patient presented with obstructive liver abnormalities requiring ERCP and non metal stent  placement by Dr. Melvia Heaps. The stent was subsequently removed in October 2008.   PRIOR THERAPY: The patient was treated with 8 cycles of Rituxan, Cytoxan, vincristine and Decadron in combination with Neulasta from 12/19/2006 through 05/15/2007. The patient then received maintenance Rituxan from July 16, 2007 through February 01, 2010. While on Rituxan, he had a recurrence of his disease by CT scan carried out on 01/24/2010. Martin Morris then received treatment with bendamustine and Rituxan in combination with Neulasta for 6 cycles from 02/19/2010 through 07/26/2010. We have a prior PET scan from 08/22/2010 and CT scans of chest, abdomen, and pelvis from 01/01/2011 that showed no evidence of disease. He had been off treatment since December 2011 and had been doing well without any symptoms until the CT scan of the abdomen and pelvis on 07/01/2011 showed signs of recurrence. CT scans of abdomen and pelvis with IVC on 09/02/11 showed further progression. Rituxan, subcutaneous Velcade, intravenous Cytoxan and Decadron were initiated on 10/15/2011. CT scans of the abdomen and pelvis carried out on 01/08/2012 showed a near complete response to therapy. CT scan of the abdomen and pelvis with IV contrast carried out on 06/19/2012 showed stable plaque-like soft tissue density in the upper abdominal retroperitoneum compared with the CT scan of 01/08/2012. CT scan of the  abdomen and pelvis with IV contrast carried out on 11/30/2012 showed no evidence of recurrent lymphoma.   CURRENT THERAPY: As of 12/04/2012, treatment program will be as follows:  -Velcade 2.75 mg subcu.  -Cytoxan 400 mg IV.  -Decadron 20 mg IV.  The above drugs will be given every 2 weeks.  -Rituxan 800 mg IV every 4 weeks.  Previously, beginning on 10/15/2011, Velcade, Cytoxan, and Decadron were  being administered weekly, and Rituxan was being administered every 3 weeks.   INTERIM HISTORY: Patient is in today for a followup visit prior to proceeding with his next scheduled cycle of Rituxan, Velcade, Cytoxan and dexamethasone for treatment of his relapsed mantle cell lymphoma, originally diagnosed March 2008. Last restaging CT scans were done in April of 2014 and were negative for any evidence of disease recurrence. He was seen last month for chemotherapy but had chest pain and it was held. He underwent a cardiac cath which revealed two of three native vessel ischemic disease on 04/13/2013 but he is a poor surgical candidate for a re-do CABG. Overall he's tolerated his treatment without difficulty. Today, he reports his chest discomfort happens primarily when he receives his chemotherapy. He also reports he has had no subsequent episodes. He presents today for rescheduled appointment as his wife had a fever earlier this week. He denies any night sweats or fever or chills himself. He complains of some right shoulder and neck pain and states his primary care physician placed him on Vicodin for pain management. He reports a long time ago he took 440 mg of Anaprox twice daily however he is not taking this medication recently.  He denies any chest pain or emergency room visits or hospitalizations since his last  visit. He continues to maintain a good appetite and denies any significant weight loss.  MEDICAL HISTORY: Past Medical History  Diagnosis Date  . Hypertension   . CAD 11/12/2007    a. s/p CABG;   b. Cath 7/11 Patent LIMA to the LAD. Previously known occlusion of a saphenous vein graft to diagonal and sequential to obtuse marginals. Severe diffuse native obtuse marginal and diagonal branch vessel disease. Preserved ejection fraction.;  c. 03/2013 Lexi CL: mild inflat and inf ischemia, EF 54%->initial med rx.  Marland Kitchen HYPERLIPIDEMIA 11/12/2007  . HYPERTENSION 11/12/2007  . PERIPHERAL VASCULAR DISEASE 11/12/2007  . ARTHRITIS 11/12/2007  . BENIGN PROSTATIC HYPERTROPHY, HX OF 11/12/2007  . Edema 10/17/2010  . INGUINAL HERNIAS, BILATERAL 11/10/2006  . Obesity, unspecified 07/05/2009  . SLEEP APNEA 11/12/2007  . History of PTCA 2005  . Osteoarthritis   . Carotid stenosis, bilateral   . Nephrolithiasis   . thymus ca dx'd 2003    Radiation comp 2003  . NHL (non-Hodgkin's lymphoma) dx'd 2008    Chemo comp 10/2010; rituxin comp 10/2010  . 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 MANTLE CELL LYMPHOMA INTRA-ABDOMINAL LYMPH NODES 11/12/2007    INTERIM HISTORY: has CANCER, THYMUS; MANTLE CELL LYMPHOMA INTRA-ABDOMINAL LYMPH NODES; HYPERLIPIDEMIA; Obesity, unspecified; HYPERTENSION; CAD; PERIPHERAL VASCULAR DISEASE; INGUINAL HERNIAS, BILATERAL; NEPHROLITHIASIS, RECURRENT; ARTHRITIS; SLEEP APNEA; BENIGN PROSTATIC HYPERTROPHY, HX OF; EDEMA; Edema; Bruising; Ulcer of lower limb, unspecified; and Midsternal chest pain on his problem list.    ALLERGIES:  is allergic to atorvastatin.  MEDICATIONS: has a current medication list which includes the following prescription(s): acyclovir, amlodipine, aspirin, atenolol, citalopram, clopidogrel, hydrocodone-acetaminophen, isosorbide mononitrate, lidocaine-prilocaine, naproxen sodium, pravastatin, PRESCRIPTION MEDICATION, sulfamethoxazole-trimethoprim, furosemide, and nitroglycerin, and the following Facility-Administered Medications: sodium chloride.  SURGICAL HISTORY:  Past Surgical History  Procedure Laterality Date  . Coronary artery bypass graft    . Cardiac catheterization  08/2009,  02/2010   REVIEW OF SYSTEMS:   Constitutional: Denies fevers, chills or abnormal weight loss Eyes: Denies blurriness of vision Ears, nose, mouth, throat, and face: Denies mucositis or sore throat Respiratory: Denies cough, dyspnea or wheezes Cardiovascular: Denies palpitation, chest discomfort or lower extremity swelling Gastrointestinal:  Denies nausea, heartburn or change in bowel habits Skin: Denies abnormal skin rashes Lymphatics: Denies new lymphadenopathy or easy bruising Neurological:Denies numbness, tingling or new weaknesses Behavioral/Psych: Mood is stable, no new changes  All other systems were reviewed with the patient and are negative.  PHYSICAL EXAMINATION: ECOG PERFORMANCE STATUS: 0 - Asymptomatic  Blood pressure 126/70, pulse 66, temperature 96.8 F (36 C), temperature source Oral, resp. rate 18, height 5\' 9"  (1.753 m), weight 206 lb 6.4 oz (93.622 kg).  GENERAL:alert, no distress and comfortable SKIN: skin color, texture, turgor are normal, no rashes or significant lesions; + bruises on area of extensor surfaces of upper extremities bilaterally.  EYES: normal, Conjunctiva are pink and non-injected, sclera clear OROPHARYNX:no exudate, no erythema and lips, buccal mucosa, and tongue normal  NECK: supple, thyroid normal size, non-tender, without nodularity LYMPH:  no palpable lymphadenopathy in the cervical, axillary or supraclavicular LUNGS: clear to auscultation and percussion with normal breathing effort HEART: regular rate & rhythm and no murmurs and no lower extremity edema ABDOMEN:abdomen soft, non-tender and normal bowel sounds Musculoskeletal:no cyanosis of digits and no clubbing  NEURO: alert & oriented x 3 with fluent speech, no focal motor/sensory deficits   LABORATORY DATA: No results found for this or any previous visit (from the past 48 hour(s)).     Labs:  Lab Results  Component Value Date   WBC 4.7 06/24/2013   HGB 12.4* 06/24/2013   HCT 37.7*  06/24/2013   MCV 98.7* 06/24/2013   PLT 194 06/24/2013   NEUTROABS 2.0 06/24/2013      Chemistry      Component Value Date/Time   NA 142 06/24/2013 1056   NA 142 04/08/2013 1440   NA 142 09/02/2011 0936   K 4.2 06/24/2013 1056   K 3.8 04/08/2013 1440   K 4.1 09/02/2011 0936   CL 107 04/08/2013 1440   CL 106 01/29/2013 0900   CL 98 09/02/2011 0936   CO2 24 06/24/2013 1056   CO2 26 04/08/2013 1440   CO2 29 09/02/2011 0936   BUN 24.1 06/24/2013 1056   BUN 18 04/08/2013 1440   BUN 17 09/02/2011 0936   CREATININE 1.4* 06/24/2013 1056   CREATININE 1.28 04/08/2013 2032   CREATININE 0.8 09/02/2011 0936      Component Value Date/Time   CALCIUM 8.7 06/24/2013 1056   CALCIUM 8.8 04/08/2013 1440   CALCIUM 8.5 09/02/2011 0936   ALKPHOS 80 06/24/2013 1056   ALKPHOS 59 04/08/2012 0850   ALKPHOS 72 09/02/2011 0936   AST 18 06/24/2013 1056   AST 21 04/08/2012 0850   AST 21 09/02/2011 0936   ALT 10 06/24/2013 1056   ALT 14 04/08/2012 0850   ALT 17 09/02/2011 0936   BILITOT 0.52 06/24/2013 1056   BILITOT 0.6 04/08/2012 0850   BILITOT 0.60 09/02/2011 0936     RADIOGRAPHIC STUDIES:  1. CT scan of chest, abdomen and pelvis with IV contrast on 01/01/2011 were negative for evidence of recurrent lymphoma.  2. PET scan from 08/22/2010 showed no evidence for recurrent lymphoma.  3. CT scan of chest, abdomen and pelvis with IV contrast on 01/01/2011 showed no evidence for lymphoma.  4. CT scan of abdomen and pelvis on 07/01/2011 showed interval development of peritoneal disease worrisome for recurrence of lymphoma. There was new right external iliac lymph node pathologically enlarged and worrisome for recurrent lymphoma. There was some stable appearance of mild soft tissue stranding surrounding the celiac trunk and superior mesenteric artery.  5. Chest x-ray, 2 view, from 09/02/2011 showed no acute findings.  6. CT scan of abdomen and pelvis with IV contrast on 09/02/2011 showed interval progression of lymphadenopathy in the  abdomen and pelvis. 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 The 1.1 x 1.7 cm omental nodule which was measured on the prior study of 07/01/2011 now measures  4.1 x 2.4 cm. A 2nd discrete soft tissue nodule in the omentum which previously measured 1.1 x 1.5 cm now measures 2.2 x 3.0 cm.  7. CT scan of abdomen and pelvis with IV contrast on 01/08/2012 showed no residual measurable mass along the anterior aspect of the right diaphragm at the site of the previously demonstrated 4.9 x 2.1 cm nodule. There is no residual measurable gastrohepatic or celiac lymphadenopathy. There is some soft tissue stranding around the proximal abdominal aorta, celiac trunk and superior mesenteric artery as noted on images 27-34. Pelvic lymphadenopathy is nearly completely resolved. A right external iliac node measuring 6 mm on image 66 previously measured 9 mm. Another node which previously measured 17 mm short axis now measures 8 mm on image 70. There is no residual inguinal adenopathy. The spleen remains  normal in size, demonstrates no focal abnormalities. There are no suspect osseous findings. There is no residual mass at the left posterior costophrenic angle. The final impression was near complete response to therapy  in the lymphadenopathy and omental disease with some  minimal residual measurable disease as previously noted. There was an enlarged left inguinal hernia containing a knuckle of sigmoid colon with  no evidence for incarceration or bowel obstruction. There was a nonobstructing left renal calculus.  8. CT scan of abdomen and pelvis with IV contrast carried out on 06/19/2012 was compared with the scan of 01/08/2012 and showed stable plaque-like soft tissue density in the upper abdominal retroperitoneum, which had the appearance of treated lymphoma. No new or progressive disease within the abdomen or pelvis was noted. There is a stable incidental finding of a small left inguinal hernia and diverticulosis.  9. Chest x-ray,  2-view, from 09/07/2012 showed low lung volumes with streaky basilar atelectasis.  10. CT scan of abdomen and pelvis with IV contrast from 11/30/2012 compared with the prior CT scans from 06/19/2012 showed no significant changes and no evidence of recurrent lymphoma or other acute findings.  11. Nuclear Myocardial scan (04/09/2013) Lexiscan Sestamibi: Clinically negative, electrically negative for ischemia. MIBI scan with evidence of a ischemia in the inferolateral wall and mild ischemia in the inferior wall. Overall region small to medium in size. LVEF on gating calculated  at 54%.  12. CT scan of abdomen and pelvis with IV contrast When compared to 11/30/2012 showed no evidence of lymphomatous involvement in the chest.  No evidence of lymphomatous involvement in the abdomen/pelvis and mildly thick-walled bladder, correlate for cystitis.   ASSESSMENT: Martin Morris 79 y.o. male with a history of MANTLE CELL LYMPHOMA INTRA-ABDOMINAL LYMPH NODES - Plan: Comprehensive metabolic panel, Lactate dehydrogenase   PLAN:  1. Mantle cell lymphoma, now in remission.  -- Last visit , we reviewed the CT of abdomen and pelvis as noted above. His disease is now in remission. We balanced continuing his chemotherapy and its benefit on controlling his mantle cell lymphoma with the risk of his ongoing chest discomfort that he associates with his therapy. Status post 18 cycles. Per Dr. Benjiman Core last note we will see him monthly and repeat scans every 3 months to assess for reoccurrence.   --On Monday, October 13 he will received Velcade 1.3 mg per meter square, Cytoxan 190 mg per meter square, Decadron 20 mg IV, rituximab 375 mg per meter square.  He returned today on October 30th for chemotherapy and a quick prechemotherapy visit. Patient was discussed with Dr. Rosie Fate. Due to his  cardiac history we will plan to follow him a bit more closely and will see him with each treatment. He'll followup with Dr. Rosie Fate in 2  weeks to with repeat labs.   2. Chest pain, intermittent. His EKG and nuclear myocardial scan is as noted above. His recent cardiac catherization (04/13/2013) revealed severe native vessel disease two of three grafts occluded. The LIMA to the LAD has moderate graft stenosis. Preserved EF but a poor candidate for CABG. He is asymptomatic for chest pain presently.  He is handling his basic and intermediate ADLs without difficulty.    3. Follow-up. Patient will be seen and treated on Thursdays going forward we'll be seen again on 07/08/2013 with a CBC differential chemistries and next cycle of chemotherapy.   All questions were answered. The patient knows to call the clinic with any problems, questions or concerns. We can certainly see the patient much sooner if necessary. Was provided and after visit summary.  I spent 25 minutes counseling the patient face to face. The total time spent in the appointment was 30 minutes.  Conni Slipper, PA-C 06/28/2013 4:19 PM

## 2013-07-02 ENCOUNTER — Other Ambulatory Visit: Payer: Self-pay

## 2013-07-05 ENCOUNTER — Telehealth: Payer: Self-pay | Admitting: *Deleted

## 2013-07-05 ENCOUNTER — Ambulatory Visit: Payer: Medicare Other

## 2013-07-05 NOTE — Telephone Encounter (Signed)
Per desk RN voicemail, I have moved 11/13 appts to earlier times

## 2013-07-08 ENCOUNTER — Ambulatory Visit (HOSPITAL_BASED_OUTPATIENT_CLINIC_OR_DEPARTMENT_OTHER): Payer: Medicare Other

## 2013-07-08 ENCOUNTER — Telehealth: Payer: Self-pay | Admitting: Internal Medicine

## 2013-07-08 ENCOUNTER — Ambulatory Visit (HOSPITAL_BASED_OUTPATIENT_CLINIC_OR_DEPARTMENT_OTHER): Payer: Medicare Other | Admitting: Internal Medicine

## 2013-07-08 ENCOUNTER — Other Ambulatory Visit: Payer: Self-pay | Admitting: Internal Medicine

## 2013-07-08 ENCOUNTER — Other Ambulatory Visit (HOSPITAL_BASED_OUTPATIENT_CLINIC_OR_DEPARTMENT_OTHER): Payer: Medicare Other | Admitting: Lab

## 2013-07-08 ENCOUNTER — Other Ambulatory Visit: Payer: Medicare Other | Admitting: Lab

## 2013-07-08 ENCOUNTER — Ambulatory Visit: Payer: Medicare Other | Admitting: Physician Assistant

## 2013-07-08 VITALS — BP 159/96 | HR 65 | Temp 98.5°F | Resp 18

## 2013-07-08 VITALS — BP 165/87 | HR 66 | Temp 97.5°F | Wt 205.9 lb

## 2013-07-08 DIAGNOSIS — Z5112 Encounter for antineoplastic immunotherapy: Secondary | ICD-10-CM

## 2013-07-08 DIAGNOSIS — C8313 Mantle cell lymphoma, intra-abdominal lymph nodes: Secondary | ICD-10-CM

## 2013-07-08 DIAGNOSIS — Z5111 Encounter for antineoplastic chemotherapy: Secondary | ICD-10-CM

## 2013-07-08 DIAGNOSIS — I2510 Atherosclerotic heart disease of native coronary artery without angina pectoris: Secondary | ICD-10-CM

## 2013-07-08 LAB — CBC WITH DIFFERENTIAL/PLATELET
Basophils Absolute: 0 10*3/uL (ref 0.0–0.1)
Eosinophils Absolute: 0.5 10*3/uL (ref 0.0–0.5)
HCT: 37.4 % — ABNORMAL LOW (ref 38.4–49.9)
HGB: 12.3 g/dL — ABNORMAL LOW (ref 13.0–17.1)
MONO#: 0.7 10*3/uL (ref 0.1–0.9)
NEUT#: 2.6 10*3/uL (ref 1.5–6.5)
NEUT%: 56.2 % (ref 39.0–75.0)
RDW: 12.7 % (ref 11.0–14.6)
WBC: 4.6 10*3/uL (ref 4.0–10.3)
lymph#: 0.8 10*3/uL — ABNORMAL LOW (ref 0.9–3.3)

## 2013-07-08 LAB — COMPREHENSIVE METABOLIC PANEL (CC13)
Anion Gap: 10 mEq/L (ref 3–11)
BUN: 20.6 mg/dL (ref 7.0–26.0)
CO2: 26 mEq/L (ref 22–29)
Calcium: 8.8 mg/dL (ref 8.4–10.4)
Chloride: 106 mEq/L (ref 98–109)
Creatinine: 1.1 mg/dL (ref 0.7–1.3)
Glucose: 96 mg/dl (ref 70–140)
Total Bilirubin: 0.48 mg/dL (ref 0.20–1.20)
Total Protein: 5.7 g/dL — ABNORMAL LOW (ref 6.4–8.3)

## 2013-07-08 LAB — LACTATE DEHYDROGENASE (CC13): LDH: 301 U/L — ABNORMAL HIGH (ref 125–245)

## 2013-07-08 MED ORDER — SODIUM CHLORIDE 0.9 % IV SOLN
375.0000 mg/m2 | Freq: Once | INTRAVENOUS | Status: AC
  Administered 2013-07-08: 800 mg via INTRAVENOUS
  Filled 2013-07-08: qty 80

## 2013-07-08 MED ORDER — DEXAMETHASONE SODIUM PHOSPHATE 20 MG/5ML IJ SOLN
INTRAMUSCULAR | Status: AC
  Filled 2013-07-08: qty 5

## 2013-07-08 MED ORDER — SODIUM CHLORIDE 0.9 % IV SOLN
190.0000 mg/m2 | Freq: Once | INTRAVENOUS | Status: AC
  Administered 2013-07-08: 400 mg via INTRAVENOUS
  Filled 2013-07-08: qty 20

## 2013-07-08 MED ORDER — ACETAMINOPHEN 325 MG PO TABS
650.0000 mg | ORAL_TABLET | Freq: Once | ORAL | Status: AC
  Administered 2013-07-08: 650 mg via ORAL

## 2013-07-08 MED ORDER — BORTEZOMIB CHEMO SQ INJECTION 3.5 MG (2.5MG/ML)
1.3000 mg/m2 | Freq: Once | INTRAMUSCULAR | Status: AC
  Administered 2013-07-08: 2.75 mg via SUBCUTANEOUS
  Filled 2013-07-08: qty 2.75

## 2013-07-08 MED ORDER — ONDANSETRON 8 MG/NS 50 ML IVPB
INTRAVENOUS | Status: AC
  Filled 2013-07-08: qty 8

## 2013-07-08 MED ORDER — DIPHENHYDRAMINE HCL 25 MG PO CAPS
ORAL_CAPSULE | ORAL | Status: AC
  Filled 2013-07-08: qty 1

## 2013-07-08 MED ORDER — DEXAMETHASONE SODIUM PHOSPHATE 4 MG/ML IJ SOLN
20.0000 mg | Freq: Once | INTRAMUSCULAR | Status: AC
  Administered 2013-07-08: 20 mg via INTRAVENOUS

## 2013-07-08 MED ORDER — SODIUM CHLORIDE 0.9 % IJ SOLN
10.0000 mL | INTRAMUSCULAR | Status: DC | PRN
  Administered 2013-07-08: 10 mL
  Filled 2013-07-08: qty 10

## 2013-07-08 MED ORDER — DIPHENHYDRAMINE HCL 25 MG PO CAPS
25.0000 mg | ORAL_CAPSULE | Freq: Once | ORAL | Status: AC
  Administered 2013-07-08: 25 mg via ORAL

## 2013-07-08 MED ORDER — ONDANSETRON 8 MG/50ML IVPB (CHCC)
8.0000 mg | Freq: Once | INTRAVENOUS | Status: AC
  Administered 2013-07-08: 8 mg via INTRAVENOUS

## 2013-07-08 MED ORDER — SODIUM CHLORIDE 0.9 % IV SOLN
Freq: Once | INTRAVENOUS | Status: AC
  Administered 2013-07-08: 10:00:00 via INTRAVENOUS

## 2013-07-08 MED ORDER — HEPARIN SOD (PORK) LOCK FLUSH 100 UNIT/ML IV SOLN
500.0000 [IU] | Freq: Once | INTRAVENOUS | Status: AC | PRN
  Administered 2013-07-08: 500 [IU]
  Filled 2013-07-08: qty 5

## 2013-07-08 MED ORDER — ACETAMINOPHEN 325 MG PO TABS
ORAL_TABLET | ORAL | Status: AC
  Filled 2013-07-08: qty 2

## 2013-07-08 NOTE — Telephone Encounter (Signed)
gv and printed appt sched and avs for pt for NOV and Dec....sed added tx. °

## 2013-07-08 NOTE — Progress Notes (Signed)
[facility] OFFICE PROGRESS NOTE  Martin Hector, MD [address] [place] [address]  DIAGNOSIS: MANTLE CELL LYMPHOMA INTRA-ABDOMINAL LYMPH NODES - Plan: CBC with Differential in 1 month, Comprehensive metabolic panel, Lactate dehydrogenase, CBC with Differential, Comprehensive metabolic panel, CT Abdomen Pelvis W Contrast  CAD  Chief Complaint  Patient presents with  . MANTLE CELL LYMPHOMA INTRA-ABDOMINAL LYMPH NODES   PRINCIPLE DIAGNOSIS: Mantle cell lymphoma diagnosed in March 2008 with positive bone marrow initially on 12/03/2006 and then negative bone marrow after treatment in October 2009. As stated, the patient presented with obstructive liver abnormalities requiring ERCP and non metal stent  placement by Dr. Melvia Heaps. The stent was subsequently removed in October 2008.   PRIOR THERAPY: The patient was treated with 8 cycles of Rituxan, Cytoxan, vincristine and Decadron in combination with Neulasta from 12/19/2006 through 05/15/2007. The patient then received maintenance Rituxan from July 16, 2007 through February 01, 2010. While on Rituxan, he had a recurrence of his disease by CT scan carried out on 01/24/2010. Martin Morris then received treatment with bendamustine and Rituxan in combination with Neulasta for 6 cycles from 02/19/2010 through 07/26/2010. We have a prior PET scan from 08/22/2010 and CT scans of chest, abdomen, and pelvis from 01/01/2011 that showed no evidence of disease. He had been off treatment since December 2011 and had been doing well without any symptoms until the CT scan of the abdomen and pelvis on 07/01/2011 showed signs of recurrence. CT scans of abdomen and pelvis with IVC on 09/02/11 showed further progression. Rituxan, subcutaneous Velcade, intravenous Cytoxan and Decadron were initiated on 10/15/2011. CT scans of the abdomen and pelvis carried out on 01/08/2012 showed a near complete response to therapy. CT scan of the abdomen and  pelvis with IV contrast carried out on 06/19/2012 showed stable plaque-like soft tissue density in the upper abdominal retroperitoneum compared with the CT scan of 01/08/2012. CT scan of the abdomen and pelvis with IV contrast carried out on 11/30/2012 showed no evidence of recurrent lymphoma.   CURRENT THERAPY: As of 12/04/2012, treatment program will be as follows:  -Velcade 2.75 mg subcu.  -Cytoxan 400 mg IV.  -Decadron 20 mg IV.  The above drugs will be given every 2 weeks.  -Rituxan 800 mg IV every 4 weeks.  Previously, beginning on 10/15/2011, Velcade, Cytoxan, and Decadron were  being administered weekly, and Rituxan was being administered every 3 weeks.   INTERIM HISTORY: Patient is in today for a followup visit prior to proceeding with his next scheduled cycle of Rituxan, Velcade, Cytoxan and dexamethasone for treatment of his relapsed mantle cell lymphoma, originally diagnosed March 2008. Last restaging CT scans were done in April of 2014 and were negative for any evidence of disease recurrence. He was seen a few months ago for chemotherapy but had chest pain and it was held. He underwent a cardiac cath which revealed two of three native vessel ischemic disease on 04/13/2013 but he is a poor surgical candidate for a re-do CABG. Overall he's tolerated his treatment without difficulty. Today, he denies chest pain. He states that he has tolerated the last few cycles without chest discomfort.  His wife is accompanying him today. H He reports a good appetite and his weight is stable.   MEDICAL HISTORY: Past Medical History  Diagnosis Date  . Hypertension   . CAD 11/12/2007    a. s/p CABG;  b. Cath 7/11 Patent LIMA to the LAD. Previously known occlusion of a saphenous  vein graft to diagonal and sequential to obtuse marginals. Severe diffuse native obtuse marginal and diagonal branch vessel disease. Preserved ejection fraction.;  c. 03/2013 Lexi CL: mild inflat and inf ischemia, EF 54%->initial  med rx.  Marland Kitchen HYPERLIPIDEMIA 11/12/2007  . HYPERTENSION 11/12/2007  . PERIPHERAL VASCULAR DISEASE 11/12/2007  . ARTHRITIS 11/12/2007  . BENIGN PROSTATIC HYPERTROPHY, HX OF 11/12/2007  . Edema 10/17/2010  . INGUINAL HERNIAS, BILATERAL 11/10/2006  . Obesity, unspecified 07/05/2009  . SLEEP APNEA 11/12/2007  . History of PTCA 2005  . Osteoarthritis   . Carotid stenosis, bilateral   . Nephrolithiasis   . thymus ca dx'd 2003    Radiation comp 2003  . NHL (non-Hodgkin's lymphoma) dx'd 2008    Chemo comp 10/2010; rituxin comp 10/2010  . 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 MANTLE CELL LYMPHOMA INTRA-ABDOMINAL LYMPH NODES 11/12/2007    INTERIM HISTORY: has CANCER, THYMUS; MANTLE CELL LYMPHOMA INTRA-ABDOMINAL LYMPH NODES; HYPERLIPIDEMIA; Obesity, unspecified; HYPERTENSION; CAD; PERIPHERAL VASCULAR DISEASE; INGUINAL HERNIAS, BILATERAL; NEPHROLITHIASIS, RECURRENT; ARTHRITIS; SLEEP APNEA; BENIGN PROSTATIC HYPERTROPHY, HX OF; EDEMA; Edema; Bruising; Ulcer of lower limb, unspecified; and Midsternal chest pain on his problem list.    ALLERGIES:  is allergic to atorvastatin.  MEDICATIONS: has a current medication list which includes the following prescription(s): acyclovir, amlodipine, aspirin, atenolol, citalopram, clopidogrel, furosemide, hydrocodone-acetaminophen, isosorbide mononitrate, lidocaine-prilocaine, naproxen sodium, nitroglycerin, pravastatin, PRESCRIPTION MEDICATION, and sulfamethoxazole-trimethoprim, and the following Facility-Administered Medications: sodium chloride.  SURGICAL HISTORY:  Past Surgical History  Procedure Laterality Date  . Coronary artery bypass graft    . Cardiac catheterization  08/2009, 02/2010   REVIEW OF SYSTEMS:   Constitutional: Denies fevers, chills or abnormal weight loss Eyes: Denies blurriness of vision Ears, nose, mouth, throat, and face: Denies mucositis or sore throat Respiratory: Denies cough, dyspnea or wheezes Cardiovascular: Denies palpitation, chest discomfort or  lower extremity swelling Gastrointestinal:  Denies nausea, heartburn or change in bowel habits Skin: Denies abnormal skin rashes Lymphatics: Denies new lymphadenopathy or easy bruising Neurological:Denies numbness, tingling or new weaknesses Behavioral/Psych: Mood is stable, no new changes  All other systems were reviewed with the patient and are negative.  PHYSICAL EXAMINATION: ECOG PERFORMANCE STATUS: 0 - Asymptomatic  Blood pressure 165/87, pulse 66, temperature 97.5 F (36.4 C), temperature source Oral, weight 205 lb 14.4 oz (93.396 kg).  GENERAL:alert, no distress and comfortable SKIN: skin color, texture, turgor are normal, no rashes or significant lesions; + bruises on area of extensor surfaces of upper extremities bilaterally.  EYES: normal, Conjunctiva are pink and non-injected, sclera clear OROPHARYNX:no exudate, no erythema and lips, buccal mucosa, and tongue normal  NECK: supple, thyroid normal size, non-tender, without nodularity LYMPH:  no palpable lymphadenopathy in the cervical, axillary or supraclavicular LUNGS: clear to auscultation and percussion with normal breathing effort HEART: regular rate & rhythm and no murmurs and no lower extremity edema ABDOMEN:abdomen soft, non-tender and normal bowel sounds Musculoskeletal:no cyanosis of digits and no clubbing  NEURO: alert & oriented x 3 with fluent speech, no focal motor/sensory deficits   LABORATORY DATA: Results for orders placed in visit on 07/08/13 (from the past 48 hour(s))  COMPREHENSIVE METABOLIC PANEL (CC13)     Status: Abnormal   Collection Time    07/08/13  8:19 AM      Result Value Range   Sodium 142  136 - 145 mEq/L   Potassium 4.2  3.5 - 5.1 mEq/L   Chloride 106  98 - 109 mEq/L   CO2 26  22 - 29 mEq/L   Glucose  96  70 - 140 mg/dl   BUN 16.1  7.0 - 09.6 mg/dL   Creatinine 1.1  0.7 - 1.3 mg/dL   Total Bilirubin 0.45  0.20 - 1.20 mg/dL   Alkaline Phosphatase 79  40 - 150 U/L   AST 20  5 - 34 U/L    ALT 17  0 - 55 U/L   Total Protein 5.7 (*) 6.4 - 8.3 g/dL   Albumin 3.3 (*) 3.5 - 5.0 g/dL   Calcium 8.8  8.4 - 40.9 mg/dL   Anion Gap 10  3 - 11 mEq/L  LACTATE DEHYDROGENASE (CC13)     Status: Abnormal   Collection Time    07/08/13  8:20 AM      Result Value Range   LDH 301 (*) 125 - 245 U/L  CBC WITH DIFFERENTIAL     Status: Abnormal   Collection Time    07/08/13  8:20 AM      Result Value Range   WBC 4.6  4.0 - 10.3 10e3/uL   NEUT# 2.6  1.5 - 6.5 10e3/uL   HGB 12.3 (*) 13.0 - 17.1 g/dL   HCT 81.1 (*) 91.4 - 78.2 %   Platelets 248  140 - 400 10e3/uL   MCV 98.2 (*) 79.3 - 98.0 fL   MCH 32.3  27.2 - 33.4 pg   MCHC 32.9  32.0 - 36.0 g/dL   RBC 9.56 (*) 2.13 - 0.86 10e6/uL   RDW 12.7  11.0 - 14.6 %   lymph# 0.8 (*) 0.9 - 3.3 10e3/uL   MONO# 0.7  0.1 - 0.9 10e3/uL   Eosinophils Absolute 0.5  0.0 - 0.5 10e3/uL   Basophils Absolute 0.0  0.0 - 0.1 10e3/uL   NEUT% 56.2  39.0 - 75.0 %   LYMPH% 17.2  14.0 - 49.0 %   MONO% 15.0 (*) 0.0 - 14.0 %   EOS% 10.7 (*) 0.0 - 7.0 %   BASO% 0.9  0.0 - 2.0 %   nRBC 0  0 - 0 %  TECHNOLOGIST REVIEW     Status: None   Collection Time    07/08/13  8:20 AM      Result Value Range   Technologist Review Metas and Myelocytes present      Labs:  Lab Results  Component Value Date   WBC 4.6 07/08/2013   HGB 12.3* 07/08/2013   HCT 37.4* 07/08/2013   MCV 98.2* 07/08/2013   PLT 248 07/08/2013   NEUTROABS 2.6 07/08/2013      Chemistry      Component Value Date/Time   NA 142 07/08/2013 0819   NA 142 04/08/2013 1440   NA 142 09/02/2011 0936   K 4.2 07/08/2013 0819   K 3.8 04/08/2013 1440   K 4.1 09/02/2011 0936   CL 107 04/08/2013 1440   CL 106 01/29/2013 0900   CL 98 09/02/2011 0936   CO2 26 07/08/2013 0819   CO2 26 04/08/2013 1440   CO2 29 09/02/2011 0936   BUN 20.6 07/08/2013 0819   BUN 18 04/08/2013 1440   BUN 17 09/02/2011 0936   CREATININE 1.1 07/08/2013 0819   CREATININE 1.28 04/08/2013 2032   CREATININE 0.8 09/02/2011 0936      Component Value  Date/Time   CALCIUM 8.8 07/08/2013 0819   CALCIUM 8.8 04/08/2013 1440   CALCIUM 8.5 09/02/2011 0936   ALKPHOS 79 07/08/2013 0819   ALKPHOS 59 04/08/2012 0850   ALKPHOS 72 09/02/2011 0936   AST 20  07/08/2013 0819   AST 21 04/08/2012 0850   AST 21 09/02/2011 0936   ALT 17 07/08/2013 0819   ALT 14 04/08/2012 0850   ALT 17 09/02/2011 0936   BILITOT 0.48 07/08/2013 0819   BILITOT 0.6 04/08/2012 0850   BILITOT 0.60 09/02/2011 0936     RADIOGRAPHIC STUDIES:  1. CT scan of chest, abdomen and pelvis with IV contrast on 01/01/2011 were negative for evidence of recurrent lymphoma.  2. PET scan from 08/22/2010 showed no evidence for recurrent lymphoma.  3. CT scan of chest, abdomen and pelvis with IV contrast on 01/01/2011 showed no evidence for lymphoma.  4. CT scan of abdomen and pelvis on 07/01/2011 showed interval development of peritoneal disease worrisome for recurrence of lymphoma. There was new right external iliac lymph node pathologically enlarged and worrisome for recurrent lymphoma. There was some stable appearance of mild soft tissue stranding surrounding the celiac trunk and superior mesenteric artery.  5. Chest x-ray, 2 view, from 09/02/2011 showed no acute findings.  6. CT scan of abdomen and pelvis with IV contrast on 09/02/2011 showed interval progression of lymphadenopathy in the abdomen and pelvis. 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 The 1.1 x 1.7 cm omental nodule which was measured on the prior study of 07/01/2011 now measures  4.1 x 2.4 cm. A 2nd discrete soft tissue nodule in the omentum which previously measured 1.1 x 1.5 cm now measures 2.2 x 3.0 cm.  7. CT scan of abdomen and pelvis with IV contrast on 01/08/2012 showed no residual measurable mass along the anterior aspect of the right diaphragm at the site of the previously demonstrated 4.9 x 2.1 cm nodule. There is no residual measurable gastrohepatic or celiac lymphadenopathy. There is some soft tissue stranding around the proximal abdominal  aorta, celiac trunk and superior mesenteric artery as noted on images 27-34. Pelvic lymphadenopathy is nearly completely resolved. A right external iliac node measuring 6 mm on image 66 previously measured 9 mm. Another node which previously measured 17 mm short axis now measures 8 mm on image 70. There is no residual inguinal adenopathy. The spleen remains normal in size, demonstrates no focal abnormalities. There are no suspect osseous findings. There is no residual mass at the left posterior costophrenic angle. The final impression was near complete response to therapy in the lymphadenopathy and omental disease with some  minimal residual measurable disease as previously noted. There was an enlarged left inguinal hernia containing a knuckle of sigmoid colon with  no evidence for incarceration or bowel obstruction. There was a nonobstructing left renal calculus.  8. CT scan of abdomen and pelvis with IV contrast carried out on 06/19/2012 was compared with the scan of 01/08/2012 and showed stable plaque-like soft tissue density in the upper abdominal retroperitoneum, which had the appearance of treated lymphoma. No new or progressive disease within the abdomen or pelvis was noted. There is a stable incidental finding of a small left inguinal hernia and diverticulosis.  9. Chest x-ray, 2-view, from 09/07/2012 showed low lung volumes with streaky basilar atelectasis.  10. CT scan of abdomen and pelvis with IV contrast from 11/30/2012 compared with the prior CT scans from 06/19/2012 showed no significant changes and no evidence of recurrent lymphoma or other acute findings.  11. Nuclear Myocardial scan (04/09/2013) Lexiscan Sestamibi: Clinically negative, electrically negative for ischemia. MIBI scan with evidence of a ischemia in the inferolateral wall and mild ischemia in the inferior wall. Overall region small to medium in size. LVEF on gating calculated  at 54%.  12. CT scan of chest, abdomen and pelvis  with IV contrast (04/08/2013) when compared to 11/30/2012 showed no evidence of lymphomatous involvement in the chest. No evidence of lymphomatous involvement in the abdomen/pelvis and mildly thick-walled bladder, correlate for cystitis.   ASSESSMENT: Martin Morris 79 y.o. male with a history of MANTLE CELL LYMPHOMA INTRA-ABDOMINAL LYMPH NODES - Plan: CBC with Differential in 1 month, Comprehensive metabolic panel, Lactate dehydrogenase, CBC with Differential, Comprehensive metabolic panel, CT Abdomen Pelvis W Contrast  CAD   PLAN:  1. Mantle cell lymphoma, now in remission.  -- CT of abdomen and pelvis as noted above on 04/08/2013 demonstrated his disease is now in remission. We balanced continuing his chemotherapy and its benefit on controlling his mantle cell lymphoma with the risk of his ongoing chest discomfort that he associates with his therapy. We reviewed his nuclear myocardial scans and EKG and cardiac cath report. We provided him a brief holiday from his therapy (he received 18 cycles) given his recent chest discomfort resumed his chemotherapy on Monday, October 13. He voiced agreement to continuing chemotherapy in the setting of his cardiac history.   We will see him monthly and repeat scans every 3 months to assess for reoccurrence.   --Today, he is scheduled to receive the following:         Velcade 1.3 mg per meter square,       Cytoxan 190 mg per meter square,       Decadron 20 mg IV,       rituximab 375 mg per meter square.  (given monthly)  He will return on November 28 (the 27th is Thanksgiving) for his next cycle (without rituximab) and a quick prechemotherapy visit. He has been provided handouts detailing each chemotherapy agent including each agent's indications, side effects,etc.  He understands a risk of marrow suppression and if he has a fever of 100.5 or greater to call the M.D. on call and/or report to the emergency room.  He understood these risks and benefits as  detailed above and decided to continue his chemotherapy.   --We will obtain a CT of abdomen pelvis on 08/04/2013 for re-staging. If stable, we will consider a q 3 week cycle instead of q 2 week cycle to allow him more time to recuperate between cycles and decrease risk of chest pain reoccurrence.   2. Chest pain, resolved.  --His EKG and nuclear myocardial scan is as noted above. His recent cardiac catherization (04/13/2013) revealed severe native vessel disease two of three grafts occluded. The LIMA to the LAD has moderate graft stenosis. Preserved EF but a poor candidate for CABG. He is asymptomatic for chest pain presently.  He is handling his basic and intermediate ADLs without difficulty.    3. Follow-up. RTC on December 11th with chemistries, complete blood count, and chemotherapy. We will discuss the results of his re-staging scans.   All questions were answered. The patient knows to call the clinic with any problems, questions or concerns. We can certainly see the patient much sooner if necessary. Was provided and after visit summary.  I spent 15 minutes counseling the patient face to face. The total time spent in the appointment was 25 minutes.    Kymora Sciara, MD 07/09/2013 8:26 AM

## 2013-07-08 NOTE — Patient Instructions (Signed)
[facility] Discharge Instructions for Patients Receiving Chemotherapy  Today you received the following chemotherapy agents: Velcade, Cytoxan, Rituxan, Dexamethasone   To help prevent nausea and vomiting after your treatment, we encourage you to take your nausea medication as prescribed.   If you develop nausea and vomiting that is not controlled by your nausea medication, call the clinic.   BELOW ARE SYMPTOMS THAT SHOULD BE REPORTED IMMEDIATELY:  *FEVER GREATER THAN 100.5 F  *CHILLS WITH OR WITHOUT FEVER  NAUSEA AND VOMITING THAT IS NOT CONTROLLED WITH YOUR NAUSEA MEDICATION  *UNUSUAL SHORTNESS OF BREATH  *UNUSUAL BRUISING OR BLEEDING  TENDERNESS IN MOUTH AND THROAT WITH OR WITHOUT PRESENCE OF ULCERS  *URINARY PROBLEMS  *BOWEL PROBLEMS  UNUSUAL RASH Items with * indicate a potential emergency and should be followed up as soon as possible.  Feel free to call the clinic you have any questions or concerns. The clinic phone number is [phone].

## 2013-07-12 ENCOUNTER — Telehealth: Payer: Self-pay | Admitting: *Deleted

## 2013-07-12 NOTE — Telephone Encounter (Signed)
Per desk RN and patient request I have rescheduled appts from 11/28 to 12/2. Patient aware of new appts

## 2013-07-23 ENCOUNTER — Ambulatory Visit: Payer: Medicare Other

## 2013-07-23 ENCOUNTER — Other Ambulatory Visit: Payer: Medicare Other | Admitting: Lab

## 2013-07-27 ENCOUNTER — Ambulatory Visit (HOSPITAL_BASED_OUTPATIENT_CLINIC_OR_DEPARTMENT_OTHER): Payer: Medicare Other

## 2013-07-27 ENCOUNTER — Other Ambulatory Visit (HOSPITAL_BASED_OUTPATIENT_CLINIC_OR_DEPARTMENT_OTHER): Payer: Medicare Other | Admitting: Lab

## 2013-07-27 VITALS — BP 162/81 | HR 66 | Temp 97.7°F | Resp 18

## 2013-07-27 DIAGNOSIS — C8313 Mantle cell lymphoma, intra-abdominal lymph nodes: Secondary | ICD-10-CM

## 2013-07-27 DIAGNOSIS — Z5111 Encounter for antineoplastic chemotherapy: Secondary | ICD-10-CM

## 2013-07-27 DIAGNOSIS — L089 Local infection of the skin and subcutaneous tissue, unspecified: Secondary | ICD-10-CM

## 2013-07-27 DIAGNOSIS — I739 Peripheral vascular disease, unspecified: Secondary | ICD-10-CM

## 2013-07-27 DIAGNOSIS — Z5112 Encounter for antineoplastic immunotherapy: Secondary | ICD-10-CM

## 2013-07-27 LAB — CBC WITH DIFFERENTIAL/PLATELET
BASO%: 0.8 % (ref 0.0–2.0)
Basophils Absolute: 0 10*3/uL (ref 0.0–0.1)
EOS%: 10 % — ABNORMAL HIGH (ref 0.0–7.0)
Eosinophils Absolute: 0.5 10*3/uL (ref 0.0–0.5)
HGB: 12.5 g/dL — ABNORMAL LOW (ref 13.0–17.1)
LYMPH%: 31.9 % (ref 14.0–49.0)
MCH: 32.6 pg (ref 27.2–33.4)
MCHC: 33.2 g/dL (ref 32.0–36.0)
MCV: 98.2 fL — ABNORMAL HIGH (ref 79.3–98.0)
MONO%: 15.4 % — ABNORMAL HIGH (ref 0.0–14.0)
NEUT%: 41.9 % (ref 39.0–75.0)
Platelets: 179 10*3/uL (ref 140–400)
RDW: 13.3 % (ref 11.0–14.6)

## 2013-07-27 MED ORDER — SODIUM CHLORIDE 0.9 % IV SOLN
190.0000 mg/m2 | Freq: Once | INTRAVENOUS | Status: AC
  Administered 2013-07-27: 400 mg via INTRAVENOUS
  Filled 2013-07-27: qty 20

## 2013-07-27 MED ORDER — BORTEZOMIB CHEMO SQ INJECTION 3.5 MG (2.5MG/ML)
1.3000 mg/m2 | Freq: Once | INTRAMUSCULAR | Status: AC
  Administered 2013-07-27: 2.75 mg via SUBCUTANEOUS
  Filled 2013-07-27: qty 2.75

## 2013-07-27 MED ORDER — DEXAMETHASONE SODIUM PHOSPHATE 4 MG/ML IJ SOLN
20.0000 mg | Freq: Once | INTRAMUSCULAR | Status: AC
  Administered 2013-07-27: 20 mg via INTRAVENOUS

## 2013-07-27 MED ORDER — DEXAMETHASONE SODIUM PHOSPHATE 20 MG/5ML IJ SOLN
INTRAMUSCULAR | Status: AC
  Filled 2013-07-27: qty 5

## 2013-07-27 MED ORDER — ONDANSETRON 8 MG/50ML IVPB (CHCC)
8.0000 mg | Freq: Once | INTRAVENOUS | Status: AC
  Administered 2013-07-27: 8 mg via INTRAVENOUS

## 2013-07-27 MED ORDER — SODIUM CHLORIDE 0.9 % IV SOLN
Freq: Once | INTRAVENOUS | Status: AC
  Administered 2013-07-27: 09:00:00 via INTRAVENOUS

## 2013-07-27 MED ORDER — HEPARIN SOD (PORK) LOCK FLUSH 100 UNIT/ML IV SOLN
500.0000 [IU] | Freq: Once | INTRAVENOUS | Status: AC | PRN
  Administered 2013-07-27: 500 [IU]
  Filled 2013-07-27: qty 5

## 2013-07-27 MED ORDER — ONDANSETRON 8 MG/NS 50 ML IVPB
INTRAVENOUS | Status: AC
  Filled 2013-07-27: qty 8

## 2013-07-27 MED ORDER — SODIUM CHLORIDE 0.9 % IJ SOLN
10.0000 mL | INTRAMUSCULAR | Status: DC | PRN
  Administered 2013-07-27: 10 mL
  Filled 2013-07-27: qty 10

## 2013-07-27 NOTE — Patient Instructions (Signed)
[facility] Discharge Instructions for Patients Receiving Chemotherapy  Today you received the following chemotherapy agents: Velcade, Cytoxan and Decadron    To help prevent nausea and vomiting after your treatment, we encourage you to take your nausea medication as prescribed.   If you develop nausea and vomiting that is not controlled by your nausea medication, call the clinic.   BELOW ARE SYMPTOMS THAT SHOULD BE REPORTED IMMEDIATELY:  *FEVER GREATER THAN 100.5 F  *CHILLS WITH OR WITHOUT FEVER  NAUSEA AND VOMITING THAT IS NOT CONTROLLED WITH YOUR NAUSEA MEDICATION  *UNUSUAL SHORTNESS OF BREATH  *UNUSUAL BRUISING OR BLEEDING  TENDERNESS IN MOUTH AND THROAT WITH OR WITHOUT PRESENCE OF ULCERS  *URINARY PROBLEMS  *BOWEL PROBLEMS  UNUSUAL RASH Items with * indicate a potential emergency and should be followed up as soon as possible.  Feel free to call the clinic you have any questions or concerns. The clinic phone number is [phone].

## 2013-08-04 ENCOUNTER — Encounter (HOSPITAL_COMMUNITY): Payer: Self-pay

## 2013-08-04 ENCOUNTER — Ambulatory Visit (HOSPITAL_COMMUNITY)
Admission: RE | Admit: 2013-08-04 | Discharge: 2013-08-04 | Disposition: A | Discharge status: Home / Self Care | Payer: Medicare Other | Source: Ambulatory Visit | Attending: Internal Medicine | Admitting: Internal Medicine

## 2013-08-04 DIAGNOSIS — Z9221 Personal history of antineoplastic chemotherapy: Secondary | ICD-10-CM | POA: Insufficient documentation

## 2013-08-04 DIAGNOSIS — I2510 Atherosclerotic heart disease of native coronary artery without angina pectoris: Secondary | ICD-10-CM | POA: Insufficient documentation

## 2013-08-04 DIAGNOSIS — K5730 Diverticulosis of large intestine without perforation or abscess without bleeding: Secondary | ICD-10-CM | POA: Insufficient documentation

## 2013-08-04 DIAGNOSIS — C8313 Mantle cell lymphoma, intra-abdominal lymph nodes: Secondary | ICD-10-CM | POA: Insufficient documentation

## 2013-08-04 DIAGNOSIS — K4090 Unilateral inguinal hernia, without obstruction or gangrene, not specified as recurrent: Secondary | ICD-10-CM | POA: Insufficient documentation

## 2013-08-04 DIAGNOSIS — K449 Diaphragmatic hernia without obstruction or gangrene: Secondary | ICD-10-CM | POA: Insufficient documentation

## 2013-08-04 DIAGNOSIS — Z923 Personal history of irradiation: Secondary | ICD-10-CM | POA: Insufficient documentation

## 2013-08-04 DIAGNOSIS — I700 Atherosclerosis of aorta: Secondary | ICD-10-CM | POA: Insufficient documentation

## 2013-08-04 DIAGNOSIS — Z8529 Personal history of malignant neoplasm of other respiratory and intrathoracic organs: Secondary | ICD-10-CM | POA: Insufficient documentation

## 2013-08-04 DIAGNOSIS — I10 Essential (primary) hypertension: Secondary | ICD-10-CM | POA: Insufficient documentation

## 2013-08-04 MED ORDER — IOHEXOL 300 MG/ML  SOLN
100.0000 mL | Freq: Once | INTRAMUSCULAR | Status: AC | PRN
  Administered 2013-08-04: 100 mL via INTRAVENOUS

## 2013-08-05 ENCOUNTER — Ambulatory Visit (HOSPITAL_BASED_OUTPATIENT_CLINIC_OR_DEPARTMENT_OTHER): Payer: Medicare Other

## 2013-08-05 ENCOUNTER — Telehealth: Payer: Self-pay | Admitting: Internal Medicine

## 2013-08-05 ENCOUNTER — Ambulatory Visit (HOSPITAL_BASED_OUTPATIENT_CLINIC_OR_DEPARTMENT_OTHER): Payer: Medicare Other | Admitting: Internal Medicine

## 2013-08-05 ENCOUNTER — Other Ambulatory Visit (HOSPITAL_BASED_OUTPATIENT_CLINIC_OR_DEPARTMENT_OTHER): Payer: Medicare Other

## 2013-08-05 VITALS — BP 147/75 | HR 63 | Temp 97.5°F | Resp 18

## 2013-08-05 VITALS — BP 140/71 | HR 64 | Temp 97.0°F | Resp 18 | Ht 69.0 in | Wt 206.8 lb

## 2013-08-05 DIAGNOSIS — C8313 Mantle cell lymphoma, intra-abdominal lymph nodes: Secondary | ICD-10-CM

## 2013-08-05 DIAGNOSIS — I2510 Atherosclerotic heart disease of native coronary artery without angina pectoris: Secondary | ICD-10-CM

## 2013-08-05 DIAGNOSIS — Z5111 Encounter for antineoplastic chemotherapy: Secondary | ICD-10-CM

## 2013-08-05 DIAGNOSIS — Z5112 Encounter for antineoplastic immunotherapy: Secondary | ICD-10-CM

## 2013-08-05 DIAGNOSIS — R609 Edema, unspecified: Secondary | ICD-10-CM

## 2013-08-05 LAB — COMPREHENSIVE METABOLIC PANEL (CC13)
Albumin: 3.5 g/dL (ref 3.5–5.0)
Alkaline Phosphatase: 75 U/L (ref 40–150)
Anion Gap: 8 mEq/L (ref 3–11)
BUN: 19.7 mg/dL (ref 7.0–26.0)
CO2: 26 mEq/L (ref 22–29)
Calcium: 9.1 mg/dL (ref 8.4–10.4)
Chloride: 107 mEq/L (ref 98–109)
Creatinine: 1.1 mg/dL (ref 0.7–1.3)
Glucose: 92 mg/dl (ref 70–140)
Potassium: 4.5 mEq/L (ref 3.5–5.1)
Total Protein: 6.1 g/dL — ABNORMAL LOW (ref 6.4–8.3)

## 2013-08-05 LAB — CBC WITH DIFFERENTIAL/PLATELET
EOS%: 8.2 % — ABNORMAL HIGH (ref 0.0–7.0)
LYMPH%: 24 % (ref 14.0–49.0)
MCHC: 33.3 g/dL (ref 32.0–36.0)
MCV: 97.8 fL (ref 79.3–98.0)
MONO#: 0.7 10*3/uL (ref 0.1–0.9)
MONO%: 12.1 % (ref 0.0–14.0)
Platelets: 153 10*3/uL (ref 140–400)
RBC: 3.68 10*6/uL — ABNORMAL LOW (ref 4.20–5.82)
RDW: 13.4 % (ref 11.0–14.6)
WBC: 5.4 10*3/uL (ref 4.0–10.3)

## 2013-08-05 LAB — LACTATE DEHYDROGENASE (CC13): LDH: 234 U/L (ref 125–245)

## 2013-08-05 MED ORDER — SODIUM CHLORIDE 0.9 % IJ SOLN
10.0000 mL | INTRAMUSCULAR | Status: DC | PRN
  Administered 2013-08-05: 10 mL
  Filled 2013-08-05: qty 10

## 2013-08-05 MED ORDER — BORTEZOMIB CHEMO SQ INJECTION 3.5 MG (2.5MG/ML)
1.3000 mg/m2 | Freq: Once | INTRAMUSCULAR | Status: AC
  Administered 2013-08-05: 2.75 mg via SUBCUTANEOUS
  Filled 2013-08-05: qty 2.75

## 2013-08-05 MED ORDER — SODIUM CHLORIDE 0.9 % IV SOLN
Freq: Once | INTRAVENOUS | Status: AC
  Administered 2013-08-05: 10:00:00 via INTRAVENOUS

## 2013-08-05 MED ORDER — DEXAMETHASONE SODIUM PHOSPHATE 20 MG/5ML IJ SOLN
INTRAMUSCULAR | Status: AC
  Filled 2013-08-05: qty 5

## 2013-08-05 MED ORDER — ONDANSETRON 8 MG/NS 50 ML IVPB
INTRAVENOUS | Status: AC
  Filled 2013-08-05: qty 8

## 2013-08-05 MED ORDER — SODIUM CHLORIDE 0.9 % IV SOLN
375.0000 mg/m2 | Freq: Once | INTRAVENOUS | Status: AC
  Administered 2013-08-05: 800 mg via INTRAVENOUS
  Filled 2013-08-05: qty 80

## 2013-08-05 MED ORDER — DEXAMETHASONE SODIUM PHOSPHATE 4 MG/ML IJ SOLN
20.0000 mg | Freq: Once | INTRAMUSCULAR | Status: AC
  Administered 2013-08-05: 20 mg via INTRAVENOUS

## 2013-08-05 MED ORDER — SODIUM CHLORIDE 0.9 % IV SOLN
190.0000 mg/m2 | Freq: Once | INTRAVENOUS | Status: AC
  Administered 2013-08-05: 400 mg via INTRAVENOUS
  Filled 2013-08-05: qty 20

## 2013-08-05 MED ORDER — ONDANSETRON 8 MG/50ML IVPB (CHCC)
8.0000 mg | Freq: Once | INTRAVENOUS | Status: AC
  Administered 2013-08-05: 8 mg via INTRAVENOUS

## 2013-08-05 MED ORDER — ACETAMINOPHEN 325 MG PO TABS
ORAL_TABLET | ORAL | Status: AC
  Filled 2013-08-05: qty 2

## 2013-08-05 MED ORDER — HYDROCODONE-ACETAMINOPHEN 5-300 MG PO TABS: 5.0000 mg | ORAL_TABLET | Freq: Four times a day (QID) | ORAL | Status: DC | PRN

## 2013-08-05 MED ORDER — DEXAMETHASONE SODIUM PHOSPHATE 10 MG/ML IJ SOLN
INTRAMUSCULAR | Status: AC
  Filled 2013-08-05: qty 1

## 2013-08-05 MED ORDER — DIPHENHYDRAMINE HCL 25 MG PO CAPS
25.0000 mg | ORAL_CAPSULE | Freq: Once | ORAL | Status: AC
  Administered 2013-08-05: 25 mg via ORAL

## 2013-08-05 MED ORDER — HEPARIN SOD (PORK) LOCK FLUSH 100 UNIT/ML IV SOLN
500.0000 [IU] | Freq: Once | INTRAVENOUS | Status: AC | PRN
  Administered 2013-08-05: 500 [IU]
  Filled 2013-08-05: qty 5

## 2013-08-05 MED ORDER — ACETAMINOPHEN 325 MG PO TABS
650.0000 mg | ORAL_TABLET | Freq: Once | ORAL | Status: AC
  Administered 2013-08-05: 650 mg via ORAL

## 2013-08-05 MED ORDER — DIPHENHYDRAMINE HCL 25 MG PO CAPS
ORAL_CAPSULE | ORAL | Status: AC
  Filled 2013-08-05: qty 1

## 2013-08-05 NOTE — Patient Instructions (Signed)
[facility] Discharge Instructions for Patients Receiving Chemotherapy  Today you received the following chemotherapy agents Velcade, Cytoxan and Rituxan.   To help prevent nausea and vomiting after your treatment, we encourage you to take your nausea medication as directed.    If you develop nausea and vomiting that is not controlled by your nausea medication, call the clinic.   BELOW ARE SYMPTOMS THAT SHOULD BE REPORTED IMMEDIATELY:  *FEVER GREATER THAN 100.5 F  *CHILLS WITH OR WITHOUT FEVER  NAUSEA AND VOMITING THAT IS NOT CONTROLLED WITH YOUR NAUSEA MEDICATION  *UNUSUAL SHORTNESS OF BREATH  *UNUSUAL BRUISING OR BLEEDING  TENDERNESS IN MOUTH AND THROAT WITH OR WITHOUT PRESENCE OF ULCERS  *URINARY PROBLEMS  *BOWEL PROBLEMS  UNUSUAL RASH Items with * indicate a potential emergency and should be followed up as soon as possible.  Feel free to call the clinic you have any questions or concerns. The clinic phone number is [phone].

## 2013-08-05 NOTE — Progress Notes (Signed)
[facility] OFFICE PROGRESS NOTE  Martin Hector, MD [address] [place] [address]  DIAGNOSIS: MANTLE CELL LYMPHOMA INTRA-ABDOMINAL LYMPH NODES - Plan: CBC with Differential, Comprehensive metabolic panel, CBC with Differential, Comprehensive metabolic panel, Lactate dehydrogenase  Edema  CAD  Chief Complaint  Patient presents with  . MANTLE CELL LYMPHOMA INTRA-ABDOMINAL LYMPH NODES   PRINCIPLE DIAGNOSIS: Mantle cell lymphoma diagnosed in March 2008 with positive bone marrow initially on 12/03/2006 and then negative bone marrow after treatment in October 2009. As stated, the patient presented with obstructive liver abnormalities requiring ERCP and non metal stent  placement by Dr. Melvia Heaps. The stent was subsequently removed in October 2008.   PRIOR THERAPY: The patient was treated with 8 cycles of Rituxan, Cytoxan, vincristine and Decadron in combination with Neulasta from 12/19/2006 through 05/15/2007. The patient then received maintenance Rituxan from July 16, 2007 through February 01, 2010. While on Rituxan, he had a recurrence of his disease by CT scan carried out on 01/24/2010. [name] then received treatment with bendamustine and Rituxan in combination with Neulasta for 6 cycles from 02/19/2010 through 07/26/2010. We have a prior PET scan from 08/22/2010 and CT scans of chest, abdomen, and pelvis from 01/01/2011 that showed no evidence of disease. He had been off treatment since December 2011 and had been doing well without any symptoms until the CT scan of the abdomen and pelvis on 07/01/2011 showed signs of recurrence. CT scans of abdomen and pelvis with IVC on 09/02/11 showed further progression. Rituxan, subcutaneous Velcade, intravenous Cytoxan and Decadron were initiated on 10/15/2011. CT scans of the abdomen and pelvis carried out on 01/08/2012 showed a near complete response to therapy. CT scan of the abdomen and pelvis with IV contrast carried out  on 06/19/2012 showed stable plaque-like soft tissue density in the upper abdominal retroperitoneum compared with the CT scan of 01/08/2012. CT scan of the abdomen and pelvis with IV contrast carried out on 11/30/2012 showed no evidence of recurrent lymphoma.   CURRENT THERAPY: As of 08/05/2013, treatment program will be as follows:  -Velcade 2.75 mg subcu.  -Cytoxan 400 mg IV.  -Decadron 20 mg IV.  The above drugs will be given every 3 weeks.  -Rituxan 800 mg IV every 6 weeks.  Previously Velcade, Cytoxan, and Decadron were being administered every 2 weeks, and Rituxan was being administered every 4 weeks. (from 12/04/2012 - 08/05/2013). Prior to that  beginning on 10/15/2011 - 12/04/2012, Velcade, Cytoxan, and Decadron were being administered weekly, and Rituxan was being administered every 2 weeks.  INTERIM HISTORY: Patient is in today for a followup visit prior to proceeding with his next scheduled cycle of Rituxan, Velcade, Cytoxan and dexamethasone for treatment of his relapsed mantle cell lymphoma, originally diagnosed March 2008. Last restaging CT scans were done this month were negative for any evidence of disease recurrence. He was seen several months ago for chemotherapy but had chest pain and it was held. He underwent a cardiac cath which revealed two of three native vessel ischemic disease on 04/13/2013 but he is a poor surgical candidate for a re-do CABG. Overall he's tolerated his treatment without difficulty. Today, he denies chest pain. He states that he has tolerated the last few cycles without chest discomfort.  His wife is accompanying him today. He reports a good appetite and his weight is stable.   MEDICAL HISTORY: Past Medical History  Diagnosis Date  . Hypertension   . CAD 11/12/2007    a. s/p CABG;  b. Cath 7/11 Patent LIMA to the LAD. Previously known occlusion of a saphenous vein graft to diagonal and sequential to obtuse marginals. Severe diffuse native obtuse marginal and  diagonal branch vessel disease. Preserved ejection fraction.;  c. 03/2013 Lexi CL: mild inflat and inf ischemia, EF 54%->initial med rx.  Marland Kitchen HYPERLIPIDEMIA 11/12/2007  . HYPERTENSION 11/12/2007  . PERIPHERAL VASCULAR DISEASE 11/12/2007  . ARTHRITIS 11/12/2007  . BENIGN PROSTATIC HYPERTROPHY, HX OF 11/12/2007  . Edema 10/17/2010  . INGUINAL HERNIAS, BILATERAL 11/10/2006  . Obesity, unspecified 07/05/2009  . SLEEP APNEA 11/12/2007  . History of PTCA 2005  . Osteoarthritis   . Carotid stenosis, bilateral   . Nephrolithiasis   . thymus ca dx'd 2003    Radiation comp 2003  . NHL (non-Hodgkin's lymphoma) dx'd 2008    Chemo comp 10/2010; rituxin comp 10/2010  . 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 MANTLE CELL LYMPHOMA INTRA-ABDOMINAL LYMPH NODES 11/12/2007    INTERIM HISTORY: has CANCER, THYMUS; MANTLE CELL LYMPHOMA INTRA-ABDOMINAL LYMPH NODES; HYPERLIPIDEMIA; Obesity, unspecified; HYPERTENSION; CAD; PERIPHERAL VASCULAR DISEASE; INGUINAL HERNIAS, BILATERAL; NEPHROLITHIASIS, RECURRENT; ARTHRITIS; SLEEP APNEA; BENIGN PROSTATIC HYPERTROPHY, HX OF; EDEMA; Edema; Bruising; Ulcer of lower limb, unspecified; and Midsternal chest pain on his problem list.    ALLERGIES:  is allergic to atorvastatin.  MEDICATIONS: has a current medication list which includes the following prescription(s): acyclovir, amlodipine, aspirin, atenolol, citalopram, clopidogrel, furosemide, isosorbide mononitrate, lidocaine-prilocaine, naproxen sodium, nitroglycerin, pravastatin, PRESCRIPTION MEDICATION, sulfamethoxazole-trimethoprim, and hydrocodone-acetaminophen, and the following Facility-Administered Medications: sodium chloride.  SURGICAL HISTORY:  Past Surgical History  Procedure Laterality Date  . Coronary artery bypass graft    . Cardiac catheterization  08/2009, 02/2010   REVIEW OF SYSTEMS:   Constitutional: Denies fevers, chills or abnormal weight loss Eyes: Denies blurriness of vision Ears, nose, mouth, throat, and face: Denies  mucositis or sore throat Respiratory: Denies cough, dyspnea or wheezes Cardiovascular: Denies palpitation, chest discomfort or lower extremity swelling Gastrointestinal:  Denies nausea, heartburn or change in bowel habits Skin: Denies abnormal skin rashes Lymphatics: Denies new lymphadenopathy or easy bruising Neurological:Denies numbness, tingling or new weaknesses Behavioral/Psych: Mood is stable, no new changes  All other systems were reviewed with the patient and are negative.  PHYSICAL EXAMINATION: ECOG PERFORMANCE STATUS: 0 - Asymptomatic  Blood pressure 140/71, pulse 64, temperature 97 F (36.1 C), resp. rate 18, height 5\' 9"  (1.753 m), weight 206 lb 12.8 oz (93.804 kg), SpO2 96.00%.  GENERAL:alert, no distress and comfortable;well developed and well nourished.  SKIN: skin color, texture, turgor are normal, no rashes or significant lesions; + bruises on area of extensor surfaces of upper extremities bilaterally.  EYES: normal, Conjunctiva are pink and non-injected, sclera clear OROPHARYNX:no exudate, no erythema and lips, buccal mucosa, and tongue normal  NECK: supple, thyroid normal size, non-tender, without nodularity LYMPH:  no palpable lymphadenopathy in the cervical, axillary or supraclavicular LUNGS: clear to auscultation and percussion with normal breathing effort HEART: regular rate & rhythm and no murmurs and no lower extremity edema ABDOMEN:abdomen soft, non-tender and normal bowel sounds Musculoskeletal:no cyanosis of digits and no clubbing  NEURO: alert & oriented x 3 with fluent speech, no focal motor/sensory deficits  LABORATORY DATA: Results for orders placed in visit on 08/05/13 (from the past 48 hour(s))  CBC WITH DIFFERENTIAL     Status: Abnormal   Collection Time    08/05/13  8:17 AM      Result Value Range   WBC 5.4  4.0 - 10.3 10e3/uL   NEUT# 3.0  1.5 -  6.5 10e3/uL   HGB 12.0 (*) 13.0 - 17.1 g/dL   HCT 40.9 (*) 81.1 - 91.4 %   Platelets 153  140 - 400  10e3/uL   MCV 97.8  79.3 - 98.0 fL   MCH 32.6  27.2 - 33.4 pg   MCHC 33.3  32.0 - 36.0 g/dL   RBC 7.82 (*) 9.56 - 2.13 10e6/uL   RDW 13.4  11.0 - 14.6 %   lymph# 1.3  0.9 - 3.3 10e3/uL   MONO# 0.7  0.1 - 0.9 10e3/uL   Eosinophils Absolute 0.4  0.0 - 0.5 10e3/uL   Basophils Absolute 0.0  0.0 - 0.1 10e3/uL   NEUT% 55.3  39.0 - 75.0 %   LYMPH% 24.0  14.0 - 49.0 %   MONO% 12.1  0.0 - 14.0 %   EOS% 8.2 (*) 0.0 - 7.0 %   BASO% 0.4  0.0 - 2.0 %   nRBC 0  0 - 0 %  COMPREHENSIVE METABOLIC PANEL (CC13)     Status: Abnormal   Collection Time    08/05/13  8:17 AM      Result Value Range   Sodium 140  136 - 145 mEq/L   Potassium 4.5  3.5 - 5.1 mEq/L   Chloride 107  98 - 109 mEq/L   CO2 26  22 - 29 mEq/L   Glucose 92  70 - 140 mg/dl   BUN 08.6  7.0 - 57.8 mg/dL   Creatinine 1.1  0.7 - 1.3 mg/dL   Total Bilirubin 4.69  0.20 - 1.20 mg/dL   Alkaline Phosphatase 75  40 - 150 U/L   AST 18  5 - 34 U/L   ALT 15  0 - 55 U/L   Total Protein 6.1 (*) 6.4 - 8.3 g/dL   Albumin 3.5  3.5 - 5.0 g/dL   Calcium 9.1  8.4 - 62.9 mg/dL   Anion Gap 8  3 - 11 mEq/L  LACTATE DEHYDROGENASE (CC13)     Status: None   Collection Time    08/05/13  8:17 AM      Result Value Range   LDH 234  125 - 245 U/L    Labs:  Lab Results  Component Value Date   WBC 5.4 08/05/2013   HGB 12.0* 08/05/2013   HCT 36.0* 08/05/2013   MCV 97.8 08/05/2013   PLT 153 08/05/2013   NEUTROABS 3.0 08/05/2013      Chemistry      Component Value Date/Time   NA 140 08/05/2013 0817   NA 142 04/08/2013 1440   NA 142 09/02/2011 0936   K 4.5 08/05/2013 0817   K 3.8 04/08/2013 1440   K 4.1 09/02/2011 0936   CL 107 04/08/2013 1440   CL 106 01/29/2013 0900   CL 98 09/02/2011 0936   CO2 26 08/05/2013 0817   CO2 26 04/08/2013 1440   CO2 29 09/02/2011 0936   BUN 19.7 08/05/2013 0817   BUN 18 04/08/2013 1440   BUN 17 09/02/2011 0936   CREATININE 1.1 08/05/2013 0817   CREATININE 1.28 04/08/2013 2032   CREATININE 0.8 09/02/2011 0936      Component  Value Date/Time   CALCIUM 9.1 08/05/2013 0817   CALCIUM 8.8 04/08/2013 1440   CALCIUM 8.5 09/02/2011 0936   ALKPHOS 75 08/05/2013 0817   ALKPHOS 59 04/08/2012 0850   ALKPHOS 72 09/02/2011 0936   AST 18 08/05/2013 0817   AST 21 04/08/2012 0850   AST 21 09/02/2011 0936  ALT 15 08/05/2013 0817   ALT 14 04/08/2012 0850   ALT 17 09/02/2011 0936   BILITOT 0.84 08/05/2013 0817   BILITOT 0.6 04/08/2012 0850   BILITOT 0.60 09/02/2011 0936     RADIOGRAPHIC STUDIES:  1. CT scan of chest, abdomen and pelvis with IV contrast on 01/01/2011 were negative for evidence of recurrent lymphoma.  2. PET scan from 08/22/2010 showed no evidence for recurrent lymphoma.  3. CT scan of chest, abdomen and pelvis with IV contrast on 01/01/2011 showed no evidence for lymphoma.  4. CT scan of abdomen and pelvis on 07/01/2011 showed interval development of peritoneal disease worrisome for recurrence of lymphoma. There was new right external iliac lymph node pathologically enlarged and worrisome for recurrent lymphoma. There was some stable appearance of mild soft tissue stranding surrounding the celiac trunk and superior mesenteric artery.  5. Chest x-ray, 2 view, from 09/02/2011 showed no acute findings.  6. CT scan of abdomen and pelvis with IV contrast on 09/02/2011 showed interval progression of lymphadenopathy in the abdomen and pelvis. 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 The 1.1 x 1.7 cm omental nodule which was measured on the prior study of 07/01/2011 now measures  4.1 x 2.4 cm. A 2nd discrete soft tissue nodule in the omentum which previously measured 1.1 x 1.5 cm now measures 2.2 x 3.0 cm.  7. CT scan of abdomen and pelvis with IV contrast on 01/08/2012 showed no residual measurable mass along the anterior aspect of the right diaphragm at the site of the previously demonstrated 4.9 x 2.1 cm nodule. There is no residual measurable gastrohepatic or celiac lymphadenopathy. There is some soft tissue stranding around the proximal  abdominal aorta, celiac trunk and superior mesenteric artery as noted on images 27-34. Pelvic lymphadenopathy is nearly completely resolved. A right external iliac node measuring 6 mm on image 66 previously measured 9 mm. Another node which previously measured 17 mm short axis now measures 8 mm on image 70. There is no residual inguinal adenopathy. The spleen remains normal in size, demonstrates no focal abnormalities. There are no suspect osseous findings. There is no residual mass at the left posterior costophrenic angle. The final impression was near complete response to therapy in the lymphadenopathy and omental disease with some  minimal residual measurable disease as previously noted. There was an enlarged left inguinal hernia containing a knuckle of sigmoid colon with  no evidence for incarceration or bowel obstruction. There was a nonobstructing left renal calculus.  8. CT scan of abdomen and pelvis with IV contrast carried out on 06/19/2012 was compared with the scan of 01/08/2012 and showed stable plaque-like soft tissue density in the upper abdominal retroperitoneum, which had the appearance of treated lymphoma. No new or progressive disease within the abdomen or pelvis was noted. There is a stable incidental finding of a small left inguinal hernia and diverticulosis.  9. Chest x-ray, 2-view, from 09/07/2012 showed low lung volumes with streaky basilar atelectasis.  10. CT scan of abdomen and pelvis with IV contrast from 11/30/2012 compared with the prior CT scans from 06/19/2012 showed no significant changes and no evidence of recurrent lymphoma or other acute findings.  11. Nuclear Myocardial scan (04/09/2013) Lexiscan Sestamibi: Clinically negative, electrically negative for ischemia. MIBI scan with evidence of a ischemia in the inferolateral wall and mild ischemia in the inferior wall. Overall region small to medium in size. LVEF on gating calculated  at 54%.  12. CT scan of chest, abdomen and  pelvis with IV contrast (  04/08/2013) when compared to 11/30/2012 showed no evidence of lymphomatous involvement in the chest. No evidence of lymphomatous involvement in the abdomen/pelvis and mildly thick-walled bladder, correlate for cystitis. 13. CT scan of abdomen and pelvis with IV contrast (08/04/2013) showed no evidence of recurrent disease in the abdomen pelvis.   ASSESSMENT: Martin Morris 79 y.o. male with a history of MANTLE CELL LYMPHOMA INTRA-ABDOMINAL LYMPH NODES - Plan: CBC with Differential, Comprehensive metabolic panel, CBC with Differential, Comprehensive metabolic panel, Lactate dehydrogenase  Edema  CAD   PLAN:  1. Mantle cell lymphoma, now in remission.  -- CT of abdomen and pelvis as noted above on 08/04/2013 demonstrated his disease is now in remission. We balanced continuing his chemotherapy and its benefit on controlling his mantle cell lymphoma with the risk of his ongoing chest discomfort that he associates with his therapy. We reviewed his nuclear myocardial scans and EKG and cardiac cath report. We provided him a brief holiday from his therapy (he received 18 cycles) given his recent chest discomfort resumed his chemotherapy on Monday, October 13. He voiced agreement to continuing chemotherapy in the setting of his cardiac history.   We will see him monthly and repeat scans every 3-4 months to assess for reoccurrence.   --Today, he is scheduled to receive the following:         Velcade 1.3 mg per meter square,       Cytoxan 190 mg per meter square,       Decadron 20 mg IV,       rituximab 375 mg per meter square.  (given every 6 weeks)  He will return in 3 weeks for his next cycle (without rituximab). Previously, he has been provided handouts detailing each chemotherapy agent including each agent's indications, side effects,etc.  He understands a risk of marrow suppression and if he has a fever of 100.5 or greater to call the M.D. on call and/or report to the  emergency room.  He understood these risks and benefits as detailed above and decided to continue his chemotherapy.   --We obtained a CT of abdomen pelvis on 08/04/2013 for re-staging as indicated above. Since it is table, we will start a q 3 week cycle instead of q 2 week cycle to allow him more time to recuperate between cycles and decrease risk of chest pain reoccurrence. He voiced agreement to this plan.   2. Chest pain, resolved.  --His EKG and nuclear myocardial scan is as noted above. His recent cardiac catherization (04/13/2013) revealed severe native vessel disease two of three grafts occluded. The LIMA to the LAD has moderate graft stenosis. Preserved EF but a poor candidate for CABG. He is asymptomatic for chest pain presently.  He is handling his basic and intermediate ADLs without difficulty.    3. Follow-up. RTC for chemotherapy pre-visit in 6 weeks with chemistries, complete blood count, and chemotherapy.   All questions were answered. The patient knows to call the clinic with any problems, questions or concerns. We can certainly see the patient much sooner if necessary. Was provided and after visit summary.  I spent 15 minutes counseling the patient face to face. The total time spent in the appointment was 25 minutes.    Erza Mothershead, MD 08/06/2013 9:34 AM

## 2013-08-05 NOTE — Telephone Encounter (Signed)
gv and printed papt sched and avs forpt for Jan 2015...sed added tx

## 2013-08-23 ENCOUNTER — Other Ambulatory Visit: Payer: Self-pay | Admitting: *Deleted

## 2013-08-23 MED ORDER — CITALOPRAM HYDROBROMIDE 20 MG PO TABS: 20.0000 mg | ORAL_TABLET | Freq: Every day | ORAL | Status: DC

## 2013-08-27 ENCOUNTER — Other Ambulatory Visit: Payer: Medicare Other

## 2013-08-27 ENCOUNTER — Ambulatory Visit: Payer: Medicare Other

## 2013-08-27 ENCOUNTER — Telehealth: Payer: Self-pay | Admitting: Medical Oncology

## 2013-08-27 ENCOUNTER — Telehealth: Payer: Self-pay | Admitting: *Deleted

## 2013-08-27 NOTE — Telephone Encounter (Signed)
Pt has flu and had to cancel his appointment this am. Per Dr. Juliann Mule pt has appointments for 09/16/13 and we can leave as scheduled. This will give him time to recovery. Pt voiced understanding.

## 2013-08-27 NOTE — Telephone Encounter (Signed)
Patient's wife called and said the patient has the flu. Told by urgent care to not come today. Message forwarded to the desk rN

## 2013-09-16 ENCOUNTER — Telehealth: Payer: Self-pay | Admitting: Internal Medicine

## 2013-09-16 ENCOUNTER — Ambulatory Visit (HOSPITAL_BASED_OUTPATIENT_CLINIC_OR_DEPARTMENT_OTHER): Payer: Medicare Other

## 2013-09-16 ENCOUNTER — Other Ambulatory Visit (HOSPITAL_BASED_OUTPATIENT_CLINIC_OR_DEPARTMENT_OTHER): Payer: Medicare Other

## 2013-09-16 ENCOUNTER — Ambulatory Visit (HOSPITAL_BASED_OUTPATIENT_CLINIC_OR_DEPARTMENT_OTHER): Payer: Medicare Other | Admitting: Internal Medicine

## 2013-09-16 VITALS — BP 177/80 | HR 77 | Temp 97.8°F | Resp 18 | Ht 69.0 in | Wt 203.6 lb

## 2013-09-16 DIAGNOSIS — Z5112 Encounter for antineoplastic immunotherapy: Secondary | ICD-10-CM

## 2013-09-16 DIAGNOSIS — C8313 Mantle cell lymphoma, intra-abdominal lymph nodes: Secondary | ICD-10-CM

## 2013-09-16 DIAGNOSIS — Z5111 Encounter for antineoplastic chemotherapy: Secondary | ICD-10-CM

## 2013-09-16 LAB — CBC WITH DIFFERENTIAL/PLATELET
BASO%: 0.3 % (ref 0.0–2.0)
Basophils Absolute: 0 10*3/uL (ref 0.0–0.1)
EOS ABS: 0.3 10*3/uL (ref 0.0–0.5)
EOS%: 5.8 % (ref 0.0–7.0)
HEMATOCRIT: 38.2 % — AB (ref 38.4–49.9)
HEMOGLOBIN: 12.6 g/dL — AB (ref 13.0–17.1)
LYMPH%: 24.4 % (ref 14.0–49.0)
MCH: 32.5 pg (ref 27.2–33.4)
MCHC: 33 g/dL (ref 32.0–36.0)
MCV: 98.5 fL — AB (ref 79.3–98.0)
MONO#: 0.7 10*3/uL (ref 0.1–0.9)
MONO%: 11.9 % (ref 0.0–14.0)
NEUT%: 57.6 % (ref 39.0–75.0)
NEUTROS ABS: 3.3 10*3/uL (ref 1.5–6.5)
PLATELETS: 142 10*3/uL (ref 140–400)
RBC: 3.88 10*6/uL — ABNORMAL LOW (ref 4.20–5.82)
RDW: 13.6 % (ref 11.0–14.6)
WBC: 5.7 10*3/uL (ref 4.0–10.3)
lymph#: 1.4 10*3/uL (ref 0.9–3.3)
nRBC: 0 % (ref 0–0)

## 2013-09-16 LAB — COMPREHENSIVE METABOLIC PANEL (CC13)
ALT: 14 U/L (ref 0–55)
AST: 20 U/L (ref 5–34)
Albumin: 3.7 g/dL (ref 3.5–5.0)
Alkaline Phosphatase: 78 U/L (ref 40–150)
Anion Gap: 9 mEq/L (ref 3–11)
BUN: 13.1 mg/dL (ref 7.0–26.0)
CO2: 26 mEq/L (ref 22–29)
CREATININE: 1 mg/dL (ref 0.7–1.3)
Calcium: 9 mg/dL (ref 8.4–10.4)
Chloride: 107 mEq/L (ref 98–109)
GLUCOSE: 101 mg/dL (ref 70–140)
Potassium: 3.7 mEq/L (ref 3.5–5.1)
SODIUM: 142 meq/L (ref 136–145)
TOTAL PROTEIN: 6.2 g/dL — AB (ref 6.4–8.3)
Total Bilirubin: 0.79 mg/dL (ref 0.20–1.20)

## 2013-09-16 LAB — LACTATE DEHYDROGENASE (CC13): LDH: 239 U/L (ref 125–245)

## 2013-09-16 MED ORDER — SODIUM CHLORIDE 0.9 % IV SOLN
190.0000 mg/m2 | Freq: Once | INTRAVENOUS | Status: AC
  Administered 2013-09-16: 400 mg via INTRAVENOUS
  Filled 2013-09-16: qty 20

## 2013-09-16 MED ORDER — ONDANSETRON 8 MG/50ML IVPB (CHCC)
8.0000 mg | Freq: Once | INTRAVENOUS | Status: AC
  Administered 2013-09-16: 8 mg via INTRAVENOUS

## 2013-09-16 MED ORDER — HEPARIN SOD (PORK) LOCK FLUSH 100 UNIT/ML IV SOLN
500.0000 [IU] | Freq: Once | INTRAVENOUS | Status: AC | PRN
  Administered 2013-09-16: 500 [IU]
  Filled 2013-09-16: qty 5

## 2013-09-16 MED ORDER — ONDANSETRON 8 MG/NS 50 ML IVPB
INTRAVENOUS | Status: AC
  Filled 2013-09-16: qty 8

## 2013-09-16 MED ORDER — DEXAMETHASONE SODIUM PHOSPHATE 20 MG/5ML IJ SOLN
INTRAMUSCULAR | Status: AC
  Filled 2013-09-16: qty 5

## 2013-09-16 MED ORDER — BORTEZOMIB CHEMO SQ INJECTION 3.5 MG (2.5MG/ML)
1.3000 mg/m2 | Freq: Once | INTRAMUSCULAR | Status: AC
  Administered 2013-09-16: 2.75 mg via SUBCUTANEOUS
  Filled 2013-09-16: qty 2.75

## 2013-09-16 MED ORDER — SODIUM CHLORIDE 0.9 % IV SOLN
Freq: Once | INTRAVENOUS | Status: AC
  Administered 2013-09-16: 11:00:00 via INTRAVENOUS

## 2013-09-16 MED ORDER — DEXAMETHASONE SODIUM PHOSPHATE 4 MG/ML IJ SOLN
20.0000 mg | Freq: Once | INTRAMUSCULAR | Status: AC
  Administered 2013-09-16: 20 mg via INTRAVENOUS

## 2013-09-16 MED ORDER — SODIUM CHLORIDE 0.9 % IJ SOLN
10.0000 mL | INTRAMUSCULAR | Status: DC | PRN
  Administered 2013-09-16: 10 mL
  Filled 2013-09-16: qty 10

## 2013-09-16 NOTE — Telephone Encounter (Signed)
gv adn printed appt sched and avs fo rpt for Feb adn March..sed added tx.... °

## 2013-09-16 NOTE — Patient Instructions (Signed)
[facility] Discharge Instructions for Patients Receiving Chemotherapy  Today you received the following chemotherapy agents: Velcade, Cytoxan  To help prevent nausea and vomiting after your treatment, we encourage you to take your nausea medication as prescribed.    If you develop nausea and vomiting that is not controlled by your nausea medication, call the clinic.   BELOW ARE SYMPTOMS THAT SHOULD BE REPORTED IMMEDIATELY:  *FEVER GREATER THAN 100.5 F  *CHILLS WITH OR WITHOUT FEVER  NAUSEA AND VOMITING THAT IS NOT CONTROLLED WITH YOUR NAUSEA MEDICATION  *UNUSUAL SHORTNESS OF BREATH  *UNUSUAL BRUISING OR BLEEDING  TENDERNESS IN MOUTH AND THROAT WITH OR WITHOUT PRESENCE OF ULCERS  *URINARY PROBLEMS  *BOWEL PROBLEMS  UNUSUAL RASH Items with * indicate a potential emergency and should be followed up as soon as possible.  Feel free to call the clinic you have any questions or concerns. The clinic phone number is [phone].

## 2013-09-16 NOTE — Patient Instructions (Signed)
[facility] Discharge Instructions for Patients Receiving Chemotherapy  Today you received the following chemotherapy agents: Cytoxan and Velcade   To help prevent nausea and vomiting after your treatment, we encourage you to take your nausea medication as prescribed.    If you develop nausea and vomiting that is not controlled by your nausea medication, call the clinic.   BELOW ARE SYMPTOMS THAT SHOULD BE REPORTED IMMEDIATELY:  *FEVER GREATER THAN 100.5 F  *CHILLS WITH OR WITHOUT FEVER  NAUSEA AND VOMITING THAT IS NOT CONTROLLED WITH YOUR NAUSEA MEDICATION  *UNUSUAL SHORTNESS OF BREATH  *UNUSUAL BRUISING OR BLEEDING  TENDERNESS IN MOUTH AND THROAT WITH OR WITHOUT PRESENCE OF ULCERS  *URINARY PROBLEMS  *BOWEL PROBLEMS  UNUSUAL RASH Items with * indicate a potential emergency and should be followed up as soon as possible.  Feel free to call the clinic you have any questions or concerns. The clinic phone number is [phone].

## 2013-09-17 NOTE — Progress Notes (Signed)
[place], [place] [address]  DIAGNOSIS: MANTLE CELL LYMPHOMA INTRA-ABDOMINAL LYMPH NODES - Plan: CBC with Differential, Comprehensive metabolic panel (Cmet) - [facility], CBC with Differential, Comprehensive metabolic panel (Cmet) - [facility], Lactate dehydrogenase (LDH) - [facility]  Chief Complaint  Patient presents with  . MANTLE CELL LYMPHOMA INTRA-ABDOMINAL LYMPH NODES   PRINCIPLE DIAGNOSIS: Mantle cell lymphoma diagnosed in March 2008 with positive bone marrow initially on 12/03/2006 and then negative bone marrow after treatment in October 2009. As stated, the patient presented with obstructive liver abnormalities requiring ERCP and non metal stent placement by Dr. Erskine Emery. The stent was subsequently removed in October 2008.   PRIOR THERAPY: The patient was treated with 8 cycles of Rituxan, Cytoxan, vincristine and Decadron in combination with Neulasta from 12/19/2006 through 05/15/2007. The patient then received maintenance Rituxan from July 16, 2007 through February 01, 2010. While on Rituxan, he had a recurrence of his disease by CT scan carried out on 01/24/2010. [name] then received treatment with bendamustine and Rituxan in combination with Neulasta for 6 cycles from 02/19/2010 through 07/26/2010. We have a prior PET scan from 08/22/2010 and CT scans of chest, abdomen, and pelvis from 01/01/2011 that showed no evidence of disease. He had been off treatment since December 2011 and had been doing well without any symptoms until the CT scan of the abdomen and pelvis on 07/01/2011 showed signs of recurrence. CT scans of abdomen and pelvis with IVC on 09/02/11 showed further progression. Rituxan, subcutaneous Velcade, intravenous Cytoxan and Decadron were initiated on 10/15/2011. CT scans of the abdomen and pelvis carried out on 01/08/2012 showed a near complete response to therapy. CT scan of the abdomen and pelvis with IV  contrast carried out on 06/19/2012 showed stable plaque-like soft tissue density in the upper abdominal retroperitoneum compared with the CT scan of 01/08/2012. CT scan of the abdomen and pelvis with IV contrast carried out on 11/30/2012 showed no evidence of recurrent lymphoma.   CURRENT THERAPY: As of 08/05/2013, treatment program will be as follows:  -Velcade 2.75 mg subcu.  -Cytoxan 400 mg IV.  -Decadron 20 mg IV.  The above drugs will be given every 3 weeks.  -Rituxan 800 mg IV every 6 weeks.  Previously Velcade, Cytoxan, and Decadron were being administered every 2 weeks, and Rituxan was being administered every 4 weeks. (from 12/04/2012 - 08/05/2013). Prior to that  beginning on 10/15/2011 - 12/04/2012, Velcade, Cytoxan, and Decadron were being administered weekly, and Rituxan was being administered every 2 weeks.  INTERIM HISTORY: Patient is in today for a followup visit prior to proceeding with his next scheduled cycle of  Velcade, Cytoxan and dexamethasone for treatment of his relapsed mantle cell lymphoma, originally diagnosed March 2008. Last restaging CT scans were done this past month were negative for any evidence of disease recurrence. He did not have chemotherapy 3 weeks ago due to he had the flu.   He is grieving at the lost of his son who recently died.   Of note, he was seen several months ago for chemotherapy but had chest pain and it was held. He underwent a cardiac cath which revealed two of three native vessel ischemic disease on 04/13/2013 but he is a poor surgical candidate for a re-do CABG. Overall he's tolerated his treatment without difficulty. Today, he denies chest pain. He states that he has tolerated the last few cycles without chest discomfort.  His wife is  accompanying him today. He reports a good appetite and his weight is stable.   MEDICAL HISTORY: Past Medical History  Diagnosis Date  . Hypertension   . CAD 11/12/2007    a. s/p CABG;  b. Cath 7/11 Patent LIMA  to the LAD. Previously known occlusion of a saphenous vein graft to diagonal and sequential to obtuse marginals. Severe diffuse native obtuse marginal and diagonal branch vessel disease. Preserved ejection fraction.;  c. 03/2013 Lexi CL: mild inflat and inf ischemia, EF 54%->initial med rx.  Marland Kitchen HYPERLIPIDEMIA 11/12/2007  . HYPERTENSION 11/12/2007  . PERIPHERAL VASCULAR DISEASE 11/12/2007  . ARTHRITIS 11/12/2007  . BENIGN PROSTATIC HYPERTROPHY, HX OF 11/12/2007  . Edema 10/17/2010  . INGUINAL HERNIAS, BILATERAL 11/10/2006  . Obesity, unspecified 07/05/2009  . SLEEP APNEA 11/12/2007  . History of PTCA 2005  . Osteoarthritis   . Carotid stenosis, bilateral   . Nephrolithiasis   . thymus ca dx'd 2003    Radiation comp 2003  . NHL (non-Hodgkin's lymphoma) dx'd 2008    Chemo comp 10/2010; rituxin comp 10/2010  . 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 MANTLE CELL LYMPHOMA INTRA-ABDOMINAL LYMPH NODES 11/12/2007    INTERIM HISTORY: has CANCER, THYMUS; MANTLE CELL LYMPHOMA INTRA-ABDOMINAL LYMPH NODES; HYPERLIPIDEMIA; Obesity, unspecified; HYPERTENSION; CAD; PERIPHERAL VASCULAR DISEASE; INGUINAL HERNIAS, BILATERAL; NEPHROLITHIASIS, RECURRENT; ARTHRITIS; SLEEP APNEA; BENIGN PROSTATIC HYPERTROPHY, HX OF; EDEMA; Edema; Bruising; Ulcer of lower limb, unspecified; and Midsternal chest pain on his problem list.    ALLERGIES:  is allergic to atorvastatin.  MEDICATIONS: has a current medication list which includes the following prescription(s): acyclovir, amlodipine, aspirin, atenolol, citalopram, clopidogrel, furosemide, hydrocodone-acetaminophen, isosorbide mononitrate, lidocaine-prilocaine, naproxen sodium, nitroglycerin, pravastatin, PRESCRIPTION MEDICATION, and sulfamethoxazole-trimethoprim, and the following Facility-Administered Medications: sodium chloride.  SURGICAL HISTORY:  Past Surgical History  Procedure Laterality Date  . Coronary artery bypass graft    . Cardiac catheterization  08/2009, 02/2010   REVIEW OF  SYSTEMS:   Constitutional: Denies fevers, chills or abnormal weight loss Eyes: Denies blurriness of vision Ears, nose, mouth, throat, and face: Denies mucositis or sore throat Respiratory: Denies cough, dyspnea or wheezes Cardiovascular: Denies palpitation, chest discomfort or lower extremity swelling Gastrointestinal:  Denies nausea, heartburn or change in bowel habits Skin: Denies abnormal skin rashes Lymphatics: Denies new lymphadenopathy or easy bruising Neurological:Denies numbness, tingling or new weaknesses Behavioral/Psych: Mood is stable, no new changes  All other systems were reviewed with the patient and are negative.  PHYSICAL EXAMINATION: ECOG PERFORMANCE STATUS: 0 - Asymptomatic  Blood pressure 177/80, pulse 77, temperature 97.8 F (36.6 C), temperature source Oral, resp. rate 18, height 5\' 9"  (1.753 m), weight 203 lb 9.6 oz (92.352 kg), SpO2 98.00%.  GENERAL:alert, no distress and comfortable;well developed and well nourished.  SKIN: skin color, texture, turgor are normal, no rashes or significant lesions; + bruises on area of extensor surfaces of upper extremities bilaterally.  EYES: normal, Conjunctiva are pink and non-injected, sclera clear OROPHARYNX:no exudate, no erythema and lips, buccal mucosa, and tongue normal  NECK: supple, thyroid normal size, non-tender, without nodularity LYMPH:  no palpable lymphadenopathy in the cervical, axillary or supraclavicular LUNGS: clear to auscultation and percussion with normal breathing effort HEART: regular rate & rhythm and no murmurs and no lower extremity edema ABDOMEN:abdomen soft, non-tender and normal bowel sounds Musculoskeletal:no cyanosis of digits and no clubbing  NEURO: alert & oriented x 3 with fluent speech, no focal motor/sensory deficits  LABORATORY DATA: Results for orders placed in visit on 09/16/13 (from the past 48 hour(s))  COMPREHENSIVE METABOLIC  PANEL (CC13)     Status: Abnormal   Collection Time     09/16/13  9:25 AM      Result Value Range   Sodium 142  136 - 145 mEq/L   Potassium 3.7  3.5 - 5.1 mEq/L   Chloride 107  98 - 109 mEq/L   CO2 26  22 - 29 mEq/L   Glucose 101  70 - 140 mg/dl   BUN 13.1  7.0 - 26.0 mg/dL   Creatinine 1.0  0.7 - 1.3 mg/dL   Total Bilirubin 0.79  0.20 - 1.20 mg/dL   Alkaline Phosphatase 78  40 - 150 U/L   AST 20  5 - 34 U/L   ALT 14  0 - 55 U/L   Total Protein 6.2 (*) 6.4 - 8.3 g/dL   Albumin 3.7  3.5 - 5.0 g/dL   Calcium 9.0  8.4 - 10.4 mg/dL   Anion Gap 9  3 - 11 mEq/L  LACTATE DEHYDROGENASE (CC13)     Status: None   Collection Time    09/16/13  9:25 AM      Result Value Range   LDH 239  125 - 245 U/L  CBC WITH DIFFERENTIAL     Status: Abnormal   Collection Time    09/16/13  9:25 AM      Result Value Range   WBC 5.7  4.0 - 10.3 10e3/uL   NEUT# 3.3  1.5 - 6.5 10e3/uL   HGB 12.6 (*) 13.0 - 17.1 g/dL   HCT 38.2 (*) 38.4 - 49.9 %   Platelets 142  140 - 400 10e3/uL   MCV 98.5 (*) 79.3 - 98.0 fL   MCH 32.5  27.2 - 33.4 pg   MCHC 33.0  32.0 - 36.0 g/dL   RBC 3.88 (*) 4.20 - 5.82 10e6/uL   RDW 13.6  11.0 - 14.6 %   lymph# 1.4  0.9 - 3.3 10e3/uL   MONO# 0.7  0.1 - 0.9 10e3/uL   Eosinophils Absolute 0.3  0.0 - 0.5 10e3/uL   Basophils Absolute 0.0  0.0 - 0.1 10e3/uL   NEUT% 57.6  39.0 - 75.0 %   LYMPH% 24.4  14.0 - 49.0 %   MONO% 11.9  0.0 - 14.0 %   EOS% 5.8  0.0 - 7.0 %   BASO% 0.3  0.0 - 2.0 %   nRBC 0  0 - 0 %    Labs:  Lab Results  Component Value Date   WBC 5.7 09/16/2013   HGB 12.6* 09/16/2013   HCT 38.2* 09/16/2013   MCV 98.5* 09/16/2013   PLT 142 09/16/2013   NEUTROABS 3.3 09/16/2013      Chemistry      Component Value Date/Time   NA 142 09/16/2013 0925   NA 142 04/08/2013 1440   NA 142 09/02/2011 0936   K 3.7 09/16/2013 0925   K 3.8 04/08/2013 1440   K 4.1 09/02/2011 0936   CL 107 04/08/2013 1440   CL 106 01/29/2013 0900   CL 98 09/02/2011 0936   CO2 26 09/16/2013 0925   CO2 26 04/08/2013 1440   CO2 29 09/02/2011 0936   BUN 13.1  09/16/2013 0925   BUN 18 04/08/2013 1440   BUN 17 09/02/2011 0936   CREATININE 1.0 09/16/2013 0925   CREATININE 1.28 04/08/2013 2032   CREATININE 0.8 09/02/2011 0936      Component Value Date/Time   CALCIUM 9.0 09/16/2013 0925   CALCIUM 8.8 04/08/2013 1440  CALCIUM 8.5 09/02/2011 0936   ALKPHOS 78 09/16/2013 0925   ALKPHOS 59 04/08/2012 0850   ALKPHOS 72 09/02/2011 0936   AST 20 09/16/2013 0925   AST 21 04/08/2012 0850   AST 21 09/02/2011 0936   ALT 14 09/16/2013 0925   ALT 14 04/08/2012 0850   ALT 17 09/02/2011 0936   BILITOT 0.79 09/16/2013 0925   BILITOT 0.6 04/08/2012 0850   BILITOT 0.60 09/02/2011 0936     RADIOGRAPHIC STUDIES:  1. CT scan of chest, abdomen and pelvis with IV contrast on 01/01/2011 were negative for evidence of recurrent lymphoma.  2. PET scan from 08/22/2010 showed no evidence for recurrent lymphoma.  3. CT scan of chest, abdomen and pelvis with IV contrast on 01/01/2011 showed no evidence for lymphoma.  4. CT scan of abdomen and pelvis on 07/01/2011 showed interval development of peritoneal disease worrisome for recurrence of lymphoma. There was new right external iliac lymph node pathologically enlarged and worrisome for recurrent lymphoma. There was some stable appearance of mild soft tissue stranding surrounding the celiac trunk and superior mesenteric artery.  5. Chest x-ray, 2 view, from 09/02/2011 showed no acute findings.  6. CT scan of abdomen and pelvis with IV contrast on 09/02/2011 showed interval progression of lymphadenopathy in the abdomen and pelvis. 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 The 1.1 x 1.7 cm omental nodule which was measured on the prior study of 07/01/2011 now measures  4.1 x 2.4 cm. A 2nd discrete soft tissue nodule in the omentum which previously measured 1.1 x 1.5 cm now measures 2.2 x 3.0 cm.  7. CT scan of abdomen and pelvis with IV contrast on 01/08/2012 showed no residual measurable mass along the anterior aspect of the right diaphragm at the site of the  previously demonstrated 4.9 x 2.1 cm nodule. There is no residual measurable gastrohepatic or celiac lymphadenopathy. There is some soft tissue stranding around the proximal abdominal aorta, celiac trunk and superior mesenteric artery as noted on images 27-34. Pelvic lymphadenopathy is nearly completely resolved. A right external iliac node measuring 6 mm on image 66 previously measured 9 mm. Another node which previously measured 17 mm short axis now measures 8 mm on image 70. There is no residual inguinal adenopathy. The spleen remains normal in size, demonstrates no focal abnormalities. There are no suspect osseous findings. There is no residual mass at the left posterior costophrenic angle. The final impression was near complete response to therapy in the lymphadenopathy and omental disease with some  minimal residual measurable disease as previously noted. There was an enlarged left inguinal hernia containing a knuckle of sigmoid colon with  no evidence for incarceration or bowel obstruction. There was a nonobstructing left renal calculus.  8. CT scan of abdomen and pelvis with IV contrast carried out on 06/19/2012 was compared with the scan of 01/08/2012 and showed stable plaque-like soft tissue density in the upper abdominal retroperitoneum, which had the appearance of treated lymphoma. No new or progressive disease within the abdomen or pelvis was noted. There is a stable incidental finding of a small left inguinal hernia and diverticulosis.  9. Chest x-ray, 2-view, from 09/07/2012 showed low lung volumes with streaky basilar atelectasis.  10. CT scan of abdomen and pelvis with IV contrast from 11/30/2012 compared with the prior CT scans from 06/19/2012 showed no significant changes and no evidence of recurrent lymphoma or other acute findings.  11. Nuclear Myocardial scan (04/09/2013) Lexiscan Sestamibi: Clinically negative, electrically negative for ischemia. MIBI scan with  evidence of a ischemia in  the inferolateral wall and mild ischemia in the inferior wall. Overall region small to medium in size. LVEF on gating calculated  at 54%.  12. CT scan of chest, abdomen and pelvis with IV contrast (04/08/2013) when compared to 11/30/2012 showed no evidence of lymphomatous involvement in the chest. No evidence of lymphomatous involvement in the abdomen/pelvis and mildly thick-walled bladder, correlate for cystitis. 13. CT scan of abdomen and pelvis with IV contrast (08/04/2013) showed no evidence of recurrent disease in the abdomen pelvis.   ASSESSMENT: Martin Morris 80 y.o. male with a history of [place] - Plan: CBC with Differential, Comprehensive metabolic panel (Cmet) - [facility], CBC with Differential, Comprehensive metabolic panel (Cmet) - [facility], Lactate dehydrogenase (LDH) - [facility]   PLAN:  1. Mantle cell lymphoma, now in remission.  -- CT of abdomen and pelvis as noted above on 08/04/2013 demonstrated his disease is now in remission. We balanced continuing his chemotherapy and its benefit on controlling his mantle cell lymphoma with the risk of his ongoing chest discomfort that he associates with his therapy. We reviewed his nuclear myocardial scans and EKG and cardiac cath report. We provided him a brief holiday from his therapy (he received 18 cycles) given his recent chest discomfort resumed his chemotherapy on Monday, October 13. He voiced agreement to continuing chemotherapy in the setting of his cardiac history.   We will see him monthly and repeat scans every 3-4 months to assess for reoccurrence.   --Today, he is scheduled to receive the following:         Velcade 1.3 mg per meter square,       Cytoxan 190 mg per meter square,       Decadron 20 mg IV,       He will return in 3 weeks for his next cycle (with rituximab). Previously, he has been provided handouts detailing each chemotherapy agent including each agent's indications, side effects,etc.   He understands a risk of marrow suppression and if he has a fever of 100.5 or greater to call the M.D. on call and/or report to the emergency room.  He understood these risks and benefits as detailed above and decided to continue his chemotherapy.   --We obtained a CT of abdomen pelvis on 08/04/2013 for re-staging as indicated above. Since it is table, we will start a q 3 week cycle instead of q 2 week cycle to allow him more time to recuperate between cycles and decrease risk of chest pain reoccurrence. He voiced agreement to this plan.   2. Chest pain, resolved.  --His EKG and nuclear myocardial scan is as noted above. His recent cardiac catherization (04/13/2013) revealed severe native vessel disease two of three grafts occluded. The LIMA to the LAD has moderate graft stenosis. Preserved EF but a poor candidate for CABG. He is asymptomatic for chest pain presently.  He is handling his basic and intermediate ADLs without difficulty.    3. Follow-up. RTC for chemotherapy pre-visit in 6 weeks with chemistries, complete blood count, and chemotherapy.   All questions were answered. The patient knows to call the clinic with any problems, questions or concerns. We can certainly see the patient much sooner if necessary. Was provided and after visit summary.  I spent 15 minutes counseling the patient face to face. The total time spent in the appointment was 25 minutes.    Doratha Mcswain, MD 09/17/2013 5:26 AM

## 2013-09-28 ENCOUNTER — Telehealth: Payer: Self-pay | Admitting: *Deleted

## 2013-09-28 MED ORDER — PRAVASTATIN SODIUM 80 MG PO TABS: 80.0000 mg | ORAL_TABLET | Freq: Every day | ORAL | Status: DC

## 2013-09-28 NOTE — Telephone Encounter (Signed)
Reviewed by Percell Belt, Pharm D, can get pravastatin on $4.00 list from [facility] or prava

## 2013-09-30 ENCOUNTER — Other Ambulatory Visit: Payer: Self-pay | Admitting: Internal Medicine

## 2013-09-30 ENCOUNTER — Other Ambulatory Visit: Payer: Self-pay | Admitting: *Deleted

## 2013-09-30 DIAGNOSIS — C8313 Mantle cell lymphoma, intra-abdominal lymph nodes: Secondary | ICD-10-CM

## 2013-09-30 MED ORDER — ACYCLOVIR 400 MG PO TABS: 400.0000 mg | ORAL_TABLET | Freq: Two times a day (BID) | ORAL | Status: DC

## 2013-10-06 ENCOUNTER — Other Ambulatory Visit: Payer: Self-pay | Admitting: Internal Medicine

## 2013-10-06 DIAGNOSIS — C8313 Mantle cell lymphoma, intra-abdominal lymph nodes: Secondary | ICD-10-CM

## 2013-10-07 ENCOUNTER — Ambulatory Visit (HOSPITAL_BASED_OUTPATIENT_CLINIC_OR_DEPARTMENT_OTHER): Payer: Medicare Other

## 2013-10-07 ENCOUNTER — Other Ambulatory Visit (HOSPITAL_BASED_OUTPATIENT_CLINIC_OR_DEPARTMENT_OTHER): Payer: Medicare Other

## 2013-10-07 VITALS — BP 153/78 | HR 71 | Temp 97.6°F | Resp 18 | Ht 69.0 in

## 2013-10-07 DIAGNOSIS — C8313 Mantle cell lymphoma, intra-abdominal lymph nodes: Secondary | ICD-10-CM

## 2013-10-07 DIAGNOSIS — Z5112 Encounter for antineoplastic immunotherapy: Secondary | ICD-10-CM

## 2013-10-07 LAB — CBC WITH DIFFERENTIAL/PLATELET
BASO%: 0.7 % (ref 0.0–2.0)
BASOS ABS: 0 10*3/uL (ref 0.0–0.1)
EOS%: 6.7 % (ref 0.0–7.0)
Eosinophils Absolute: 0.4 10*3/uL (ref 0.0–0.5)
HCT: 37.8 % — ABNORMAL LOW (ref 38.4–49.9)
HEMOGLOBIN: 12.6 g/dL — AB (ref 13.0–17.1)
LYMPH%: 28 % (ref 14.0–49.0)
MCH: 32.6 pg (ref 27.2–33.4)
MCHC: 33.3 g/dL (ref 32.0–36.0)
MCV: 97.9 fL (ref 79.3–98.0)
MONO#: 1 10*3/uL — ABNORMAL HIGH (ref 0.1–0.9)
MONO%: 16.1 % — ABNORMAL HIGH (ref 0.0–14.0)
NEUT#: 3 10*3/uL (ref 1.5–6.5)
NEUT%: 48.5 % (ref 39.0–75.0)
Platelets: 183 10*3/uL (ref 140–400)
RBC: 3.86 10*6/uL — ABNORMAL LOW (ref 4.20–5.82)
RDW: 14 % (ref 11.0–14.6)
WBC: 6.1 10*3/uL (ref 4.0–10.3)
lymph#: 1.7 10*3/uL (ref 0.9–3.3)

## 2013-10-07 LAB — COMPREHENSIVE METABOLIC PANEL (CC13)
ALBUMIN: 3.8 g/dL (ref 3.5–5.0)
ALT: 15 U/L (ref 0–55)
AST: 20 U/L (ref 5–34)
Alkaline Phosphatase: 82 U/L (ref 40–150)
Anion Gap: 10 mEq/L (ref 3–11)
BUN: 20.8 mg/dL (ref 7.0–26.0)
CALCIUM: 9.5 mg/dL (ref 8.4–10.4)
CHLORIDE: 105 meq/L (ref 98–109)
CO2: 25 mEq/L (ref 22–29)
Creatinine: 1.2 mg/dL (ref 0.7–1.3)
Glucose: 103 mg/dl (ref 70–140)
POTASSIUM: 4.1 meq/L (ref 3.5–5.1)
Sodium: 140 mEq/L (ref 136–145)
TOTAL PROTEIN: 6.4 g/dL (ref 6.4–8.3)
Total Bilirubin: 0.76 mg/dL (ref 0.20–1.20)

## 2013-10-07 MED ORDER — ACETAMINOPHEN 325 MG PO TABS
650.0000 mg | ORAL_TABLET | Freq: Once | ORAL | Status: AC
  Administered 2013-10-07: 650 mg via ORAL

## 2013-10-07 MED ORDER — SODIUM CHLORIDE 0.9 % IV SOLN
Freq: Once | INTRAVENOUS | Status: AC
  Administered 2013-10-07: 10:00:00 via INTRAVENOUS

## 2013-10-07 MED ORDER — ACETAMINOPHEN 325 MG PO TABS
ORAL_TABLET | ORAL | Status: AC
  Filled 2013-10-07: qty 2

## 2013-10-07 MED ORDER — DEXAMETHASONE SODIUM PHOSPHATE 4 MG/ML IJ SOLN
20.0000 mg | Freq: Once | INTRAMUSCULAR | Status: AC
  Administered 2013-10-07: 20 mg via INTRAVENOUS

## 2013-10-07 MED ORDER — SODIUM CHLORIDE 0.9 % IV SOLN
375.0000 mg/m2 | Freq: Once | INTRAVENOUS | Status: AC
  Administered 2013-10-07: 800 mg via INTRAVENOUS
  Filled 2013-10-07: qty 80

## 2013-10-07 MED ORDER — DIPHENHYDRAMINE HCL 25 MG PO CAPS
25.0000 mg | ORAL_CAPSULE | Freq: Once | ORAL | Status: AC
  Administered 2013-10-07: 25 mg via ORAL

## 2013-10-07 MED ORDER — ONDANSETRON 8 MG/50ML IVPB (CHCC)
8.0000 mg | Freq: Once | INTRAVENOUS | Status: AC
  Administered 2013-10-07: 8 mg via INTRAVENOUS

## 2013-10-07 MED ORDER — SODIUM CHLORIDE 0.9 % IJ SOLN
10.0000 mL | INTRAMUSCULAR | Status: DC | PRN
  Administered 2013-10-07: 10 mL
  Filled 2013-10-07: qty 10

## 2013-10-07 MED ORDER — DIPHENHYDRAMINE HCL 25 MG PO CAPS
ORAL_CAPSULE | ORAL | Status: AC
  Filled 2013-10-07: qty 1

## 2013-10-07 MED ORDER — CYCLOPHOSPHAMIDE CHEMO INJECTION 1 GM
190.0000 mg/m2 | Freq: Once | INTRAMUSCULAR | Status: AC
  Administered 2013-10-07: 400 mg via INTRAVENOUS
  Filled 2013-10-07: qty 20

## 2013-10-07 MED ORDER — ONDANSETRON 8 MG/NS 50 ML IVPB
INTRAVENOUS | Status: AC
  Filled 2013-10-07: qty 8

## 2013-10-07 MED ORDER — BORTEZOMIB CHEMO SQ INJECTION 3.5 MG (2.5MG/ML)
1.3000 mg/m2 | Freq: Once | INTRAMUSCULAR | Status: AC
  Administered 2013-10-07: 2.75 mg via SUBCUTANEOUS
  Filled 2013-10-07: qty 2.75

## 2013-10-07 MED ORDER — DEXAMETHASONE SODIUM PHOSPHATE 20 MG/5ML IJ SOLN
INTRAMUSCULAR | Status: AC
  Filled 2013-10-07: qty 5

## 2013-10-07 MED ORDER — HEPARIN SOD (PORK) LOCK FLUSH 100 UNIT/ML IV SOLN
500.0000 [IU] | Freq: Once | INTRAVENOUS | Status: AC | PRN
  Administered 2013-10-07: 500 [IU]
  Filled 2013-10-07: qty 5

## 2013-10-07 NOTE — Patient Instructions (Signed)
[facility] Discharge Instructions for Patients Receiving Chemotherapy  Today you received the following chemotherapy agents: Rituxan, Cytoxan and Velcade   To help prevent nausea and vomiting after your treatment, we encourage you to take your nausea medication as prescribed.   If you develop nausea and vomiting that is not controlled by your nausea medication, call the clinic.   BELOW ARE SYMPTOMS THAT SHOULD BE REPORTED IMMEDIATELY:  *FEVER GREATER THAN 100.5 F  *CHILLS WITH OR WITHOUT FEVER  NAUSEA AND VOMITING THAT IS NOT CONTROLLED WITH YOUR NAUSEA MEDICATION  *UNUSUAL SHORTNESS OF BREATH  *UNUSUAL BRUISING OR BLEEDING  TENDERNESS IN MOUTH AND THROAT WITH OR WITHOUT PRESENCE OF ULCERS  *URINARY PROBLEMS  *BOWEL PROBLEMS  UNUSUAL RASH Items with * indicate a potential emergency and should be followed up as soon as possible.  Feel free to call the clinic you have any questions or concerns. The clinic phone number is [phone].

## 2013-10-28 ENCOUNTER — Other Ambulatory Visit (HOSPITAL_BASED_OUTPATIENT_CLINIC_OR_DEPARTMENT_OTHER): Payer: Medicare Other

## 2013-10-28 ENCOUNTER — Ambulatory Visit (HOSPITAL_BASED_OUTPATIENT_CLINIC_OR_DEPARTMENT_OTHER): Payer: Medicare Other

## 2013-10-28 ENCOUNTER — Telehealth: Payer: Self-pay | Admitting: Internal Medicine

## 2013-10-28 ENCOUNTER — Ambulatory Visit (HOSPITAL_BASED_OUTPATIENT_CLINIC_OR_DEPARTMENT_OTHER): Payer: Medicare Other | Admitting: Internal Medicine

## 2013-10-28 ENCOUNTER — Telehealth: Payer: Self-pay | Admitting: *Deleted

## 2013-10-28 VITALS — BP 141/74 | HR 66 | Temp 97.8°F | Resp 19 | Ht 69.0 in | Wt 205.5 lb

## 2013-10-28 DIAGNOSIS — C8313 Mantle cell lymphoma, intra-abdominal lymph nodes: Secondary | ICD-10-CM

## 2013-10-28 DIAGNOSIS — Z5112 Encounter for antineoplastic immunotherapy: Secondary | ICD-10-CM

## 2013-10-28 DIAGNOSIS — Z5111 Encounter for antineoplastic chemotherapy: Secondary | ICD-10-CM

## 2013-10-28 LAB — CBC WITH DIFFERENTIAL/PLATELET
BASO%: 0.6 % (ref 0.0–2.0)
Basophils Absolute: 0 10*3/uL (ref 0.0–0.1)
EOS%: 6.4 % (ref 0.0–7.0)
Eosinophils Absolute: 0.3 10*3/uL (ref 0.0–0.5)
HCT: 36.1 % — ABNORMAL LOW (ref 38.4–49.9)
HEMOGLOBIN: 11.7 g/dL — AB (ref 13.0–17.1)
LYMPH#: 1.5 10*3/uL (ref 0.9–3.3)
LYMPH%: 30.2 % (ref 14.0–49.0)
MCH: 32.1 pg (ref 27.2–33.4)
MCHC: 32.4 g/dL (ref 32.0–36.0)
MCV: 99.2 fL — ABNORMAL HIGH (ref 79.3–98.0)
MONO#: 0.7 10*3/uL (ref 0.1–0.9)
MONO%: 14.8 % — ABNORMAL HIGH (ref 0.0–14.0)
NEUT#: 2.3 10*3/uL (ref 1.5–6.5)
NEUT%: 48 % (ref 39.0–75.0)
NRBC: 0 % (ref 0–0)
Platelets: 165 10*3/uL (ref 140–400)
RBC: 3.64 10*6/uL — AB (ref 4.20–5.82)
RDW: 13.8 % (ref 11.0–14.6)
WBC: 4.9 10*3/uL (ref 4.0–10.3)

## 2013-10-28 LAB — COMPREHENSIVE METABOLIC PANEL (CC13)
ALBUMIN: 3.8 g/dL (ref 3.5–5.0)
ALT: 12 U/L (ref 0–55)
AST: 18 U/L (ref 5–34)
Alkaline Phosphatase: 76 U/L (ref 40–150)
Anion Gap: 8 mEq/L (ref 3–11)
BILIRUBIN TOTAL: 0.73 mg/dL (ref 0.20–1.20)
BUN: 18.9 mg/dL (ref 7.0–26.0)
CO2: 26 mEq/L (ref 22–29)
Calcium: 9.2 mg/dL (ref 8.4–10.4)
Chloride: 108 mEq/L (ref 98–109)
Creatinine: 1.3 mg/dL (ref 0.7–1.3)
Glucose: 91 mg/dl (ref 70–140)
POTASSIUM: 4.3 meq/L (ref 3.5–5.1)
Sodium: 142 mEq/L (ref 136–145)
TOTAL PROTEIN: 6.3 g/dL — AB (ref 6.4–8.3)

## 2013-10-28 LAB — LACTATE DEHYDROGENASE (CC13): LDH: 215 U/L (ref 125–245)

## 2013-10-28 MED ORDER — HEPARIN SOD (PORK) LOCK FLUSH 100 UNIT/ML IV SOLN
500.0000 [IU] | Freq: Once | INTRAVENOUS | Status: AC | PRN
  Administered 2013-10-28: 500 [IU]
  Filled 2013-10-28: qty 5

## 2013-10-28 MED ORDER — SODIUM CHLORIDE 0.9 % IV SOLN
Freq: Once | INTRAVENOUS | Status: AC
  Administered 2013-10-28: 11:00:00 via INTRAVENOUS

## 2013-10-28 MED ORDER — SODIUM CHLORIDE 0.9 % IJ SOLN
10.0000 mL | INTRAMUSCULAR | Status: DC | PRN
  Administered 2013-10-28: 10 mL
  Filled 2013-10-28: qty 10

## 2013-10-28 MED ORDER — ONDANSETRON 8 MG/NS 50 ML IVPB
INTRAVENOUS | Status: AC
  Filled 2013-10-28: qty 8

## 2013-10-28 MED ORDER — ONDANSETRON 8 MG/50ML IVPB (CHCC)
8.0000 mg | Freq: Once | INTRAVENOUS | Status: AC
  Administered 2013-10-28: 8 mg via INTRAVENOUS

## 2013-10-28 MED ORDER — BORTEZOMIB CHEMO SQ INJECTION 3.5 MG (2.5MG/ML)
1.3000 mg/m2 | Freq: Once | INTRAMUSCULAR | Status: AC
  Administered 2013-10-28: 2.75 mg via SUBCUTANEOUS
  Filled 2013-10-28: qty 2.75

## 2013-10-28 MED ORDER — SODIUM CHLORIDE 0.9 % IV SOLN
190.0000 mg/m2 | Freq: Once | INTRAVENOUS | Status: AC
  Administered 2013-10-28: 400 mg via INTRAVENOUS
  Filled 2013-10-28: qty 20

## 2013-10-28 NOTE — Telephone Encounter (Signed)
Per staff message and POF I have scheduled appts.  JMW  

## 2013-10-28 NOTE — Telephone Encounter (Signed)
Gave pt appt for lab and MD , emailed michelle regarding chemo and gave pt oral contrast for CT

## 2013-10-28 NOTE — Patient Instructions (Signed)
[facility] Discharge Instructions for Patients Receiving Chemotherapy  Today you received the following chemotherapy agents velcade and cytoxan  To help prevent nausea and vomiting after your treatment, we encourage you to take your nausea medication as directed.   If you develop nausea and vomiting that is not controlled by your nausea medication, call the clinic.   BELOW ARE SYMPTOMS THAT SHOULD BE REPORTED IMMEDIATELY:  *FEVER GREATER THAN 100.5 F  *CHILLS WITH OR WITHOUT FEVER  NAUSEA AND VOMITING THAT IS NOT CONTROLLED WITH YOUR NAUSEA MEDICATION  *UNUSUAL SHORTNESS OF BREATH  *UNUSUAL BRUISING OR BLEEDING  TENDERNESS IN MOUTH AND THROAT WITH OR WITHOUT PRESENCE OF ULCERS  *URINARY PROBLEMS  *BOWEL PROBLEMS  UNUSUAL RASH Items with * indicate a potential emergency and should be followed up as soon as possible.  Feel free to call the clinic you have any questions or concerns. The clinic phone number is [phone].

## 2013-10-29 ENCOUNTER — Telehealth: Payer: Self-pay | Admitting: Internal Medicine

## 2013-10-29 NOTE — Telephone Encounter (Signed)
Talked to pt gave him appt for lab and MD for MArch and APril 2015

## 2013-10-30 NOTE — Progress Notes (Signed)
[place], [place] [address]  DIAGNOSIS: MANTLE CELL LYMPHOMA INTRA-ABDOMINAL LYMPH NODES - Plan: CBC with Differential, Lactate dehydrogenase (LDH) - [facility], Basic metabolic panel (Bmet) - [facility], CBC with Differential, Comprehensive metabolic panel (Cmet) - [facility], Lactate dehydrogenase (LDH) - [facility], CT Abdomen Pelvis W Contrast, CT Chest W Contrast  Chief Complaint  Patient presents with  . MANTLE CELL LYMPHOMA INTRA-ABDOMINAL LYMPH NODES   PRINCIPLE DIAGNOSIS: Mantle cell lymphoma diagnosed in March 2008 with positive bone marrow initially on 12/03/2006 and then negative bone marrow after treatment in October 2009. As stated, the patient presented with obstructive liver abnormalities requiring ERCP and non metal stent placement by Dr. Erskine Emery. The stent was subsequently removed in October 2008.   PRIOR THERAPY: The patient was treated with 8 cycles of Rituxan, Cytoxan, vincristine and Decadron in combination with Neulasta from 12/19/2006 through 05/15/2007. The patient then received maintenance Rituxan from July 16, 2007 through February 01, 2010. While on Rituxan, he had a recurrence of his disease by CT scan carried out on 01/24/2010. Mr. Woolum then received treatment with bendamustine and Rituxan in combination with Neulasta for 6 cycles from 02/19/2010 through 07/26/2010. We have a prior PET scan from 08/22/2010 and CT scans of chest, abdomen, and pelvis from 01/01/2011 that showed no evidence of disease. He had been off treatment since December 2011 and had been doing well without any symptoms until the CT scan of the abdomen and pelvis on 07/01/2011 showed signs of recurrence. CT scans of abdomen and pelvis with IVC on 09/02/11 showed further progression. Rituxan, subcutaneous Velcade, intravenous Cytoxan and Decadron were initiated on 10/15/2011. CT scans of the abdomen and pelvis carried out on 01/08/2012 showed  a near complete response to therapy. CT scan of the abdomen and pelvis with IV contrast carried out on 06/19/2012 showed stable plaque-like soft tissue density in the upper abdominal retroperitoneum compared with the CT scan of 01/08/2012. CT scan of the abdomen and pelvis with IV contrast carried out on 11/30/2012 showed no evidence of recurrent lymphoma.   CURRENT THERAPY: As of 08/05/2013, treatment program will be as follows:  -Velcade 2.75 mg subcu.  -Cytoxan 400 mg IV.  -Decadron 20 mg IV.  The above drugs will be given every 3 weeks.  -Rituxan 800 mg IV every 6 weeks.  Previously Velcade, Cytoxan, and Decadron were being administered every 2 weeks, and Rituxan was being administered every 4 weeks. (from 12/04/2012 - 08/05/2013). Prior to that  beginning on 10/15/2011 - 12/04/2012, Velcade, Cytoxan, and Decadron were being administered weekly, and Rituxan was being administered every 2 weeks.  INTERIM HISTORY: Patient is in today for a followup visit prior to proceeding with his next scheduled cycle of  Velcade, Cytoxan and dexamethasone for treatment of his relapsed mantle cell lymphoma, originally diagnosed March 2008.  Of note, he was seen several months ago for chemotherapy but had chest pain and it was held. He underwent a cardiac cath which revealed two of three native vessel ischemic disease on 04/13/2013 but he is a poor surgical candidate for a re-do CABG. Overall he's tolerated his treatment without difficulty. Today, he denies chest pain. He states that he has tolerated the last few cycles without chest discomfort.  His wife is accompanying him today. He reports a good appetite and his weight is stable.   MEDICAL HISTORY: Past Medical History  Diagnosis Date  . Hypertension   . CAD 11/12/2007  a. s/p CABG;  b. Cath 7/11 Patent LIMA to the LAD. Previously known occlusion of a saphenous vein graft to diagonal and sequential to obtuse marginals. Severe diffuse native obtuse marginal  and diagonal branch vessel disease. Preserved ejection fraction.;  c. 03/2013 Lexi CL: mild inflat and inf ischemia, EF 54%->initial med rx.  Marland Kitchen HYPERLIPIDEMIA 11/12/2007  . HYPERTENSION 11/12/2007  . PERIPHERAL VASCULAR DISEASE 11/12/2007  . ARTHRITIS 11/12/2007  . BENIGN PROSTATIC HYPERTROPHY, HX OF 11/12/2007  . Edema 10/17/2010  . INGUINAL HERNIAS, BILATERAL 11/10/2006  . Obesity, unspecified 07/05/2009  . SLEEP APNEA 11/12/2007  . History of PTCA 2005  . Osteoarthritis   . Carotid stenosis, bilateral   . Nephrolithiasis   . thymus ca dx'd 2003    Radiation comp 2003  . NHL (non-Hodgkin's lymphoma) dx'd 2008    Chemo comp 10/2010; rituxin comp 10/2010  . 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 MANTLE CELL LYMPHOMA INTRA-ABDOMINAL LYMPH NODES 11/12/2007    INTERIM HISTORY: has CANCER, THYMUS; MANTLE CELL LYMPHOMA INTRA-ABDOMINAL LYMPH NODES; HYPERLIPIDEMIA; Obesity, unspecified; HYPERTENSION; CAD; PERIPHERAL VASCULAR DISEASE; INGUINAL HERNIAS, BILATERAL; NEPHROLITHIASIS, RECURRENT; ARTHRITIS; SLEEP APNEA; BENIGN PROSTATIC HYPERTROPHY, HX OF; EDEMA; Edema; Bruising; Ulcer of lower limb, unspecified; and Midsternal chest pain on his problem list.    ALLERGIES:  is allergic to atorvastatin.  MEDICATIONS: has a current medication list which includes the following prescription(s): acyclovir, amlodipine, aspirin, atenolol, citalopram, clopidogrel, furosemide, hydrocodone-acetaminophen, isosorbide mononitrate, lidocaine-prilocaine, naproxen sodium, nitroglycerin, pravastatin, PRESCRIPTION MEDICATION, and sulfamethoxazole-trimethoprim, and the following Facility-Administered Medications: sodium chloride.  SURGICAL HISTORY:  Past Surgical History  Procedure Laterality Date  . Coronary artery bypass graft    . Cardiac catheterization  08/2009, 02/2010   REVIEW OF SYSTEMS:   Constitutional: Denies fevers, chills or abnormal weight loss Eyes: Denies blurriness of vision Ears, nose, mouth, throat, and face: Denies  mucositis or sore throat Respiratory: Denies cough, dyspnea or wheezes Cardiovascular: Denies palpitation, chest discomfort or lower extremity swelling Gastrointestinal:  Denies nausea, heartburn or change in bowel habits Skin: Denies abnormal skin rashes Lymphatics: Denies new lymphadenopathy or easy bruising Neurological:Denies numbness, tingling or new weaknesses Behavioral/Psych: Mood is stable, no new changes  All other systems were reviewed with the patient and are negative.  PHYSICAL EXAMINATION: ECOG PERFORMANCE STATUS: 0 - Asymptomatic  Blood pressure 141/74, pulse 66, temperature 97.8 F (36.6 C), temperature source Oral, resp. rate 19, height 5\' 9"  (1.753 m), weight 205 lb 8 oz (93.214 kg).  GENERAL:alert, no distress and comfortable;well developed and well nourished.  SKIN: skin color, texture, turgor are normal, no rashes or significant lesions; + bruises on area of extensor surfaces of upper extremities bilaterally.  EYES: normal, Conjunctiva are pink and non-injected, sclera clear OROPHARYNX:no exudate, no erythema and lips, buccal mucosa, and tongue normal  NECK: supple, thyroid normal size, non-tender, without nodularity LYMPH:  no palpable lymphadenopathy in the cervical, axillary or supraclavicular LUNGS: clear to auscultation and percussion with normal breathing effort HEART: regular rate & rhythm and no murmurs and no lower extremity edema ABDOMEN:abdomen soft, non-tender and normal bowel sounds Musculoskeletal:no cyanosis of digits and no clubbing  NEURO: alert & oriented x 3 with fluent speech, no focal motor/sensory deficits  Labs:  Lab Results  Component Value Date   WBC 4.9 10/28/2013   HGB 11.7* 10/28/2013   HCT 36.1* 10/28/2013   MCV 99.2* 10/28/2013   PLT 165 10/28/2013   NEUTROABS 2.3 10/28/2013      Chemistry      Component Value Date/Time  NA 142 10/28/2013 0940   NA 142 04/08/2013 1440   NA 142 09/02/2011 0936   K 4.3 10/28/2013 0940   K 3.8 04/08/2013 1440    K 4.1 09/02/2011 0936   CL 107 04/08/2013 1440   CL 106 01/29/2013 0900   CL 98 09/02/2011 0936   CO2 26 10/28/2013 0940   CO2 26 04/08/2013 1440   CO2 29 09/02/2011 0936   BUN 18.9 10/28/2013 0940   BUN 18 04/08/2013 1440   BUN 17 09/02/2011 0936   CREATININE 1.3 10/28/2013 0940   CREATININE 1.28 04/08/2013 2032   CREATININE 0.8 09/02/2011 0936      Component Value Date/Time   CALCIUM 9.2 10/28/2013 0940   CALCIUM 8.8 04/08/2013 1440   CALCIUM 8.5 09/02/2011 0936   ALKPHOS 76 10/28/2013 0940   ALKPHOS 59 04/08/2012 0850   ALKPHOS 72 09/02/2011 0936   AST 18 10/28/2013 0940   AST 21 04/08/2012 0850   AST 21 09/02/2011 0936   ALT 12 10/28/2013 0940   ALT 14 04/08/2012 0850   ALT 17 09/02/2011 0936   BILITOT 0.73 10/28/2013 0940   BILITOT 0.6 04/08/2012 0850   BILITOT 0.60 09/02/2011 0936     RADIOGRAPHIC STUDIES:  1. CT scan of chest, abdomen and pelvis with IV contrast on 01/01/2011 were negative for evidence of recurrent lymphoma.  2. PET scan from 08/22/2010 showed no evidence for recurrent lymphoma.  3. CT scan of chest, abdomen and pelvis with IV contrast on 01/01/2011 showed no evidence for lymphoma.  4. CT scan of abdomen and pelvis on 07/01/2011 showed interval development of peritoneal disease worrisome for recurrence of lymphoma. There was new right external iliac lymph node pathologically enlarged and worrisome for recurrent lymphoma. There was some stable appearance of mild soft tissue stranding surrounding the celiac trunk and superior mesenteric artery.  5. Chest x-ray, 2 view, from 09/02/2011 showed no acute findings.  6. CT scan of abdomen and pelvis with IV contrast on 09/02/2011 showed interval progression of lymphadenopathy in the abdomen and pelvis. 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 The 1.1 x 1.7 cm omental nodule which was measured on the prior study of 07/01/2011 now measures 4.1 x 2.4 cm. A 2nd discrete soft tissue nodule in the omentum which previously measured 1.1 x 1.5 cm now measures 2.2 x 3.0 cm.   7. CT scan of abdomen and pelvis with IV contrast on 01/08/2012 showed no residual measurable mass along the anterior aspect of the right diaphragm at the site of the previously demonstrated 4.9 x 2.1 cm nodule. There is no residual measurable gastrohepatic or celiac lymphadenopathy. There is some soft tissue stranding around the proximal abdominal aorta, celiac trunk and superior mesenteric artery as noted on images 27-34. Pelvic lymphadenopathy is nearly completely resolved. A right external iliac node measuring 6 mm on image 66 previously measured 9 mm. Another node which previously measured 17 mm short axis now measures 8 mm on image 70. There is no residual inguinal adenopathy. The spleen remains normal in size, demonstrates no focal abnormalities. There are no suspect osseous findings. There is no residual mass at the left posterior costophrenic angle. The final impression was near complete response to therapy in the lymphadenopathy and omental disease with some  minimal residual measurable disease as previously noted. There was an enlarged left inguinal hernia containing a knuckle of sigmoid colon with  no evidence for incarceration or bowel obstruction. There was a nonobstructing left renal calculus.  8. CT scan of abdomen  and pelvis with IV contrast carried out on 06/19/2012 was compared with the scan of 01/08/2012 and showed stable plaque-like soft tissue density in the upper abdominal retroperitoneum, which had the appearance of treated lymphoma. No new or progressive disease within the abdomen or pelvis was noted. There is a stable incidental finding of a small left inguinal hernia and diverticulosis.  9. Chest x-ray, 2-view, from 09/07/2012 showed low lung volumes with streaky basilar atelectasis.  10. CT scan of abdomen and pelvis with IV contrast from 11/30/2012 compared with the prior CT scans from 06/19/2012 showed no significant changes and no evidence of recurrent lymphoma or other acute  findings.  11. Nuclear Myocardial scan (04/09/2013) Lexiscan Sestamibi: Clinically negative, electrically negative for ischemia. MIBI scan with evidence of a ischemia in the inferolateral wall and mild ischemia in the inferior wall. Overall region small to medium in size. LVEF on gating calculated at 54%.  12. CT scan of chest, abdomen and pelvis with IV contrast (04/08/2013) when compared to 11/30/2012 showed no evidence of lymphomatous involvement in the chest. No evidence of lymphomatous involvement in the abdomen/pelvis and mildly thick-walled bladder, correlate for cystitis. 13. CT scan of abdomen and pelvis with IV contrast (08/04/2013) showed no evidence of recurrent disease in the abdomen pelvis.   ASSESSMENT: Martin Morris 80 y.o. male with a history of MANTLE CELL LYMPHOMA INTRA-ABDOMINAL LYMPH NODES - Plan: CBC with Differential, Lactate dehydrogenase (LDH) - [facility], Basic metabolic panel (Bmet) - [facility], CBC with Differential, Comprehensive metabolic panel (Cmet) - [facility], Lactate dehydrogenase (LDH) - [facility], CT Abdomen Pelvis W Contrast, CT Chest W Contrast   PLAN:  1. Mantle cell lymphoma, now in remission.  -- CT of abdomen and pelvis as noted above on 08/04/2013 demonstrated his disease is now in remission. We balanced continuing his chemotherapy and its benefit on controlling his mantle cell lymphoma with the risk of his ongoing chest discomfort that he associates with his therapy. We reviewed his nuclear myocardial scans and EKG and cardiac cath report. We provided him a brief holiday from his therapy (he received 18 cycles) given his recent chest discomfort resumed his chemotherapy on Monday, October 13. He voiced agreement to continuing chemotherapy in the setting of his cardiac history. He has tolerated therapy well without additional chest pain.   We will see him monthly and repeat scans every 3-4 months to assess for reoccurrence.   --Today, he is scheduled to receive the  following:         Velcade 1.3 mg per meter square,       Cytoxan 190 mg per meter square,       Decadron 20 mg IV,       He will return in 3 weeks for his next cycle (with rituximab). Previously, he has been provided handouts detailing each chemotherapy agent including each agent's indications, side effects,etc.  He understands a risk of marrow suppression and if he has a fever of 100.5 or greater to call the M.D. on call and/or report to the emergency room.  He understood these risks and benefits as detailed above and decided to continue his chemotherapy.   --We obtained a CT of abdomen pelvis on 08/04/2013 for re-staging as indicated above. Since it is stable, we started a q 3 week cycle instead of q 2 week cycle to allow him more time to recuperate between cycles and decrease risk of chest pain reoccurrence. He voiced agreement to this plan. We will repeat scans prior to his  next visit to determine disease state.   2. Chest pain, resolved.  -- His recent cardiac catherization (04/13/2013) revealed severe native vessel disease two of three grafts occluded. The LIMA to the LAD has moderate graft stenosis. Preserved EF but a poor candidate for CABG. He is asymptomatic for chest pain presently.  He is handling his basic and intermediate ADLs without difficulty.    3. Follow-up. RTC for chemotherapy pre-visit in 6 weeks with chemistries, complete blood count, and chemotherapy.   All questions were answered. The patient knows to call the clinic with any problems, questions or concerns. We can certainly see the patient much sooner if necessary. Was provided and after visit summary.  I spent 15 minutes counseling the patient face to face. The total time spent in the appointment was 25 minutes.    Avarey Yaeger, MD 10/30/2013 9:02 PM

## 2013-11-02 ENCOUNTER — Other Ambulatory Visit: Payer: Self-pay | Admitting: Internal Medicine

## 2013-11-16 ENCOUNTER — Other Ambulatory Visit: Payer: Self-pay | Admitting: Cardiology

## 2013-11-18 ENCOUNTER — Other Ambulatory Visit: Payer: Self-pay | Admitting: Internal Medicine

## 2013-11-18 ENCOUNTER — Ambulatory Visit (HOSPITAL_BASED_OUTPATIENT_CLINIC_OR_DEPARTMENT_OTHER): Payer: Medicare Other

## 2013-11-18 ENCOUNTER — Other Ambulatory Visit (HOSPITAL_BASED_OUTPATIENT_CLINIC_OR_DEPARTMENT_OTHER): Payer: Medicare Other

## 2013-11-18 VITALS — BP 138/74 | HR 69 | Temp 96.8°F | Resp 20

## 2013-11-18 DIAGNOSIS — Z5112 Encounter for antineoplastic immunotherapy: Secondary | ICD-10-CM

## 2013-11-18 DIAGNOSIS — Z5111 Encounter for antineoplastic chemotherapy: Secondary | ICD-10-CM

## 2013-11-18 DIAGNOSIS — C8313 Mantle cell lymphoma, intra-abdominal lymph nodes: Secondary | ICD-10-CM

## 2013-11-18 LAB — LACTATE DEHYDROGENASE (CC13): LDH: 234 U/L (ref 125–245)

## 2013-11-18 LAB — CBC WITH DIFFERENTIAL/PLATELET
BASO%: 0.7 % (ref 0.0–2.0)
Basophils Absolute: 0 10*3/uL (ref 0.0–0.1)
EOS ABS: 0.3 10*3/uL (ref 0.0–0.5)
EOS%: 5.4 % (ref 0.0–7.0)
HEMATOCRIT: 37.2 % — AB (ref 38.4–49.9)
HEMOGLOBIN: 12.2 g/dL — AB (ref 13.0–17.1)
LYMPH#: 1.8 10*3/uL (ref 0.9–3.3)
LYMPH%: 34.1 % (ref 14.0–49.0)
MCH: 32.6 pg (ref 27.2–33.4)
MCHC: 32.8 g/dL (ref 32.0–36.0)
MCV: 99.5 fL — ABNORMAL HIGH (ref 79.3–98.0)
MONO#: 0.9 10*3/uL (ref 0.1–0.9)
MONO%: 16.6 % — ABNORMAL HIGH (ref 0.0–14.0)
NEUT%: 43.2 % (ref 39.0–75.0)
NEUTROS ABS: 2.3 10*3/uL (ref 1.5–6.5)
PLATELETS: 168 10*3/uL (ref 140–400)
RBC: 3.74 10*6/uL — ABNORMAL LOW (ref 4.20–5.82)
RDW: 13.7 % (ref 11.0–14.6)
WBC: 5.4 10*3/uL (ref 4.0–10.3)
nRBC: 0 % (ref 0–0)

## 2013-11-18 LAB — BASIC METABOLIC PANEL (CC13)
Anion Gap: 10 mEq/L (ref 3–11)
BUN: 17.8 mg/dL (ref 7.0–26.0)
CHLORIDE: 110 meq/L — AB (ref 98–109)
CO2: 24 meq/L (ref 22–29)
CREATININE: 1.3 mg/dL (ref 0.7–1.3)
Calcium: 9 mg/dL (ref 8.4–10.4)
Glucose: 96 mg/dl (ref 70–140)
POTASSIUM: 4.2 meq/L (ref 3.5–5.1)
Sodium: 144 mEq/L (ref 136–145)

## 2013-11-18 MED ORDER — ONDANSETRON 8 MG/50ML IVPB (CHCC)
8.0000 mg | Freq: Once | INTRAVENOUS | Status: AC
  Administered 2013-11-18: 8 mg via INTRAVENOUS

## 2013-11-18 MED ORDER — ACETAMINOPHEN 325 MG PO TABS
650.0000 mg | ORAL_TABLET | Freq: Once | ORAL | Status: AC
  Administered 2013-11-18: 650 mg via ORAL

## 2013-11-18 MED ORDER — DEXAMETHASONE SODIUM PHOSPHATE 20 MG/5ML IJ SOLN
INTRAMUSCULAR | Status: AC
  Filled 2013-11-18: qty 5

## 2013-11-18 MED ORDER — ACETAMINOPHEN 325 MG PO TABS
ORAL_TABLET | ORAL | Status: AC
  Filled 2013-11-18: qty 2

## 2013-11-18 MED ORDER — SODIUM CHLORIDE 0.9 % IV SOLN
190.0000 mg/m2 | Freq: Once | INTRAVENOUS | Status: AC
  Administered 2013-11-18: 400 mg via INTRAVENOUS
  Filled 2013-11-18: qty 20

## 2013-11-18 MED ORDER — DIPHENHYDRAMINE HCL 25 MG PO CAPS
25.0000 mg | ORAL_CAPSULE | Freq: Once | ORAL | Status: AC
  Administered 2013-11-18: 25 mg via ORAL

## 2013-11-18 MED ORDER — BORTEZOMIB CHEMO SQ INJECTION 3.5 MG (2.5MG/ML)
1.3000 mg/m2 | Freq: Once | INTRAMUSCULAR | Status: AC
  Administered 2013-11-18: 2.75 mg via SUBCUTANEOUS
  Filled 2013-11-18: qty 2.75

## 2013-11-18 MED ORDER — SODIUM CHLORIDE 0.9 % IJ SOLN
10.0000 mL | INTRAMUSCULAR | Status: DC | PRN
  Administered 2013-11-18: 10 mL
  Filled 2013-11-18: qty 10

## 2013-11-18 MED ORDER — ONDANSETRON 8 MG/NS 50 ML IVPB
INTRAVENOUS | Status: AC
  Filled 2013-11-18: qty 8

## 2013-11-18 MED ORDER — DEXAMETHASONE SODIUM PHOSPHATE 4 MG/ML IJ SOLN
20.0000 mg | Freq: Once | INTRAMUSCULAR | Status: AC
  Administered 2013-11-18: 20 mg via INTRAVENOUS

## 2013-11-18 MED ORDER — SODIUM CHLORIDE 0.9 % IV SOLN
375.0000 mg/m2 | Freq: Once | INTRAVENOUS | Status: AC
  Administered 2013-11-18: 800 mg via INTRAVENOUS
  Filled 2013-11-18: qty 80

## 2013-11-18 MED ORDER — HEPARIN SOD (PORK) LOCK FLUSH 100 UNIT/ML IV SOLN
500.0000 [IU] | Freq: Once | INTRAVENOUS | Status: AC | PRN
  Administered 2013-11-18: 500 [IU]
  Filled 2013-11-18: qty 5

## 2013-11-18 MED ORDER — SODIUM CHLORIDE 0.9 % IV SOLN
Freq: Once | INTRAVENOUS | Status: AC
  Administered 2013-11-18: 10:00:00 via INTRAVENOUS

## 2013-11-18 NOTE — Patient Instructions (Signed)
[facility] Discharge Instructions for Patients Receiving Chemotherapy  Today you received the following chemotherapy agents Velcade, Cytoxan, Rituxan.   To help prevent nausea and vomiting after your treatment, we encourage you to take your nausea medication if needed.    If you develop nausea and vomiting that is not controlled by your nausea medication, call the clinic.   BELOW ARE SYMPTOMS THAT SHOULD BE REPORTED IMMEDIATELY:  *FEVER GREATER THAN 100.5 F  *CHILLS WITH OR WITHOUT FEVER  NAUSEA AND VOMITING THAT IS NOT CONTROLLED WITH YOUR NAUSEA MEDICATION  *UNUSUAL SHORTNESS OF BREATH  *UNUSUAL BRUISING OR BLEEDING  TENDERNESS IN MOUTH AND THROAT WITH OR WITHOUT PRESENCE OF ULCERS  *URINARY PROBLEMS  *BOWEL PROBLEMS  UNUSUAL RASH Items with * indicate a potential emergency and should be followed up as soon as possible.  Feel free to call the clinic you have any questions or concerns. The clinic phone number is [phone].

## 2013-11-24 ENCOUNTER — Other Ambulatory Visit: Payer: Self-pay

## 2013-11-24 ENCOUNTER — Encounter: Payer: Self-pay | Admitting: Internal Medicine

## 2013-11-24 ENCOUNTER — Other Ambulatory Visit: Payer: Self-pay | Admitting: Cardiology

## 2013-11-24 MED ORDER — ISOSORBIDE MONONITRATE ER 120 MG PO TB24: 120.0000 mg | ORAL_TABLET | Freq: Every day | ORAL | Status: DC

## 2013-11-24 NOTE — Progress Notes (Signed)
Received letter from Patient [employer].  Pt is approved for Rituxan and Velcade from 11/24/13 to 11/24/14 or when the benefit cap has been met.  Expenses can be submitted for reimbursement for dos 08/26/13 to 11/24/14.  The amount of the grant is $10,000.  I will forward copy of letter to [facility] in billing.

## 2013-12-02 ENCOUNTER — Ambulatory Visit (HOSPITAL_COMMUNITY)
Admission: RE | Admit: 2013-12-02 | Discharge: 2013-12-02 | Disposition: A | Discharge status: Home / Self Care | Payer: Medicare Other | Source: Ambulatory Visit | Attending: Internal Medicine | Admitting: Internal Medicine

## 2013-12-02 ENCOUNTER — Encounter (HOSPITAL_COMMUNITY): Payer: Self-pay

## 2013-12-02 DIAGNOSIS — K4090 Unilateral inguinal hernia, without obstruction or gangrene, not specified as recurrent: Secondary | ICD-10-CM | POA: Insufficient documentation

## 2013-12-02 DIAGNOSIS — K7689 Other specified diseases of liver: Secondary | ICD-10-CM | POA: Insufficient documentation

## 2013-12-02 DIAGNOSIS — C8313 Mantle cell lymphoma, intra-abdominal lymph nodes: Secondary | ICD-10-CM

## 2013-12-02 DIAGNOSIS — C8589 Other specified types of non-Hodgkin lymphoma, extranodal and solid organ sites: Secondary | ICD-10-CM | POA: Insufficient documentation

## 2013-12-02 MED ORDER — IOHEXOL 300 MG/ML  SOLN
100.0000 mL | Freq: Once | INTRAMUSCULAR | Status: AC | PRN
  Administered 2013-12-02: 100 mL via INTRAVENOUS

## 2013-12-06 ENCOUNTER — Other Ambulatory Visit: Payer: Self-pay | Admitting: Internal Medicine

## 2013-12-09 ENCOUNTER — Encounter (HOSPITAL_COMMUNITY)
Admission: EM | Disposition: A | Discharge status: Skilled Nursing Facility | Payer: Medicare Other | Source: Home / Self Care | Attending: Internal Medicine

## 2013-12-09 ENCOUNTER — Encounter (HOSPITAL_COMMUNITY): Payer: Medicare Other | Admitting: Anesthesiology

## 2013-12-09 ENCOUNTER — Emergency Department (HOSPITAL_COMMUNITY): Payer: Medicare Other

## 2013-12-09 ENCOUNTER — Ambulatory Visit: Payer: Medicare Other

## 2013-12-09 ENCOUNTER — Other Ambulatory Visit: Payer: Medicare Other

## 2013-12-09 ENCOUNTER — Inpatient Hospital Stay (HOSPITAL_COMMUNITY): Payer: Medicare Other | Admitting: Anesthesiology

## 2013-12-09 ENCOUNTER — Telehealth: Payer: Self-pay | Admitting: Medical Oncology

## 2013-12-09 ENCOUNTER — Inpatient Hospital Stay (HOSPITAL_COMMUNITY): Payer: Medicare Other

## 2013-12-09 ENCOUNTER — Encounter (HOSPITAL_COMMUNITY): Payer: Self-pay | Admitting: Emergency Medicine

## 2013-12-09 ENCOUNTER — Inpatient Hospital Stay (HOSPITAL_COMMUNITY)
Admission: EM | Admit: 2013-12-09 | Discharge: 2013-12-13 | DRG: 470 | Disposition: A | Discharge status: Skilled Nursing Facility | Payer: Medicare Other | Attending: Internal Medicine | Admitting: Internal Medicine

## 2013-12-09 DIAGNOSIS — Y92009 Unspecified place in unspecified non-institutional (private) residence as the place of occurrence of the external cause: Secondary | ICD-10-CM

## 2013-12-09 DIAGNOSIS — S72002A Fracture of unspecified part of neck of left femur, initial encounter for closed fracture: Secondary | ICD-10-CM

## 2013-12-09 DIAGNOSIS — G4730 Sleep apnea, unspecified: Secondary | ICD-10-CM | POA: Diagnosis present

## 2013-12-09 DIAGNOSIS — D62 Acute posthemorrhagic anemia: Secondary | ICD-10-CM | POA: Diagnosis not present

## 2013-12-09 DIAGNOSIS — E669 Obesity, unspecified: Secondary | ICD-10-CM | POA: Diagnosis present

## 2013-12-09 DIAGNOSIS — S72009A Fracture of unspecified part of neck of unspecified femur, initial encounter for closed fracture: Principal | ICD-10-CM | POA: Diagnosis present

## 2013-12-09 DIAGNOSIS — Z888 Allergy status to other drugs, medicaments and biological substances status: Secondary | ICD-10-CM

## 2013-12-09 DIAGNOSIS — R0902 Hypoxemia: Secondary | ICD-10-CM | POA: Diagnosis not present

## 2013-12-09 DIAGNOSIS — C8319 Mantle cell lymphoma, extranodal and solid organ sites: Secondary | ICD-10-CM | POA: Diagnosis present

## 2013-12-09 DIAGNOSIS — Z8529 Personal history of malignant neoplasm of other respiratory and intrathoracic organs: Secondary | ICD-10-CM

## 2013-12-09 DIAGNOSIS — I739 Peripheral vascular disease, unspecified: Secondary | ICD-10-CM | POA: Diagnosis present

## 2013-12-09 DIAGNOSIS — R339 Retention of urine, unspecified: Secondary | ICD-10-CM | POA: Diagnosis not present

## 2013-12-09 DIAGNOSIS — C8313 Mantle cell lymphoma, intra-abdominal lymph nodes: Secondary | ICD-10-CM

## 2013-12-09 DIAGNOSIS — M25559 Pain in unspecified hip: Secondary | ICD-10-CM | POA: Diagnosis present

## 2013-12-09 DIAGNOSIS — Z7902 Long term (current) use of antithrombotics/antiplatelets: Secondary | ICD-10-CM

## 2013-12-09 DIAGNOSIS — M1610 Unilateral primary osteoarthritis, unspecified hip: Secondary | ICD-10-CM | POA: Diagnosis present

## 2013-12-09 DIAGNOSIS — I2510 Atherosclerotic heart disease of native coronary artery without angina pectoris: Secondary | ICD-10-CM | POA: Diagnosis present

## 2013-12-09 DIAGNOSIS — Z79899 Other long term (current) drug therapy: Secondary | ICD-10-CM

## 2013-12-09 DIAGNOSIS — Z951 Presence of aortocoronary bypass graft: Secondary | ICD-10-CM

## 2013-12-09 DIAGNOSIS — E785 Hyperlipidemia, unspecified: Secondary | ICD-10-CM | POA: Diagnosis present

## 2013-12-09 DIAGNOSIS — W108XXA Fall (on) (from) other stairs and steps, initial encounter: Secondary | ICD-10-CM | POA: Diagnosis present

## 2013-12-09 DIAGNOSIS — Z7982 Long term (current) use of aspirin: Secondary | ICD-10-CM

## 2013-12-09 DIAGNOSIS — R9431 Abnormal electrocardiogram [ECG] [EKG]: Secondary | ICD-10-CM

## 2013-12-09 DIAGNOSIS — I10 Essential (primary) hypertension: Secondary | ICD-10-CM | POA: Diagnosis present

## 2013-12-09 DIAGNOSIS — M169 Osteoarthritis of hip, unspecified: Secondary | ICD-10-CM | POA: Diagnosis present

## 2013-12-09 DIAGNOSIS — Z6830 Body mass index (BMI) 30.0-30.9, adult: Secondary | ICD-10-CM

## 2013-12-09 DIAGNOSIS — M62838 Other muscle spasm: Secondary | ICD-10-CM | POA: Diagnosis present

## 2013-12-09 DIAGNOSIS — Z9861 Coronary angioplasty status: Secondary | ICD-10-CM

## 2013-12-09 DIAGNOSIS — C8318 Mantle cell lymphoma, lymph nodes of multiple sites: Secondary | ICD-10-CM | POA: Diagnosis present

## 2013-12-09 DIAGNOSIS — Z8249 Family history of ischemic heart disease and other diseases of the circulatory system: Secondary | ICD-10-CM

## 2013-12-09 HISTORY — PX: TOTAL HIP ARTHROPLASTY: SHX124

## 2013-12-09 LAB — URINALYSIS, ROUTINE W REFLEX MICROSCOPIC
BILIRUBIN URINE: NEGATIVE
Glucose, UA: NEGATIVE mg/dL
HGB URINE DIPSTICK: NEGATIVE
Ketones, ur: NEGATIVE mg/dL
Leukocytes, UA: NEGATIVE
Nitrite: NEGATIVE
Protein, ur: NEGATIVE mg/dL
Specific Gravity, Urine: 1.01 (ref 1.005–1.030)
UROBILINOGEN UA: 0.2 mg/dL (ref 0.0–1.0)
pH: 7 (ref 5.0–8.0)

## 2013-12-09 LAB — BASIC METABOLIC PANEL
BUN: 24 mg/dL — ABNORMAL HIGH (ref 6–23)
CHLORIDE: 102 meq/L (ref 96–112)
CO2: 25 mEq/L (ref 19–32)
Calcium: 9.1 mg/dL (ref 8.4–10.5)
Creatinine, Ser: 1.33 mg/dL (ref 0.50–1.35)
GFR calc Af Amer: 57 mL/min — ABNORMAL LOW (ref >90)
GFR calc non Af Amer: 49 mL/min — ABNORMAL LOW (ref >90)
Glucose, Bld: 135 mg/dL — ABNORMAL HIGH (ref 70–99)
POTASSIUM: 3.8 meq/L (ref 3.7–5.3)
Sodium: 140 mEq/L (ref 137–147)

## 2013-12-09 LAB — CBC WITH DIFFERENTIAL/PLATELET
BASOS ABS: 0 10*3/uL (ref 0.0–0.1)
BASOS PCT: 0 % (ref 0–1)
Eosinophils Absolute: 0.2 10*3/uL (ref 0.0–0.7)
Eosinophils Relative: 3 % (ref 0–5)
HCT: 36 % — ABNORMAL LOW (ref 39.0–52.0)
HEMOGLOBIN: 11.9 g/dL — AB (ref 13.0–17.0)
Lymphocytes Relative: 14 % (ref 12–46)
Lymphs Abs: 0.9 10*3/uL (ref 0.7–4.0)
MCH: 32.8 pg (ref 26.0–34.0)
MCHC: 33.1 g/dL (ref 30.0–36.0)
MCV: 99.2 fL (ref 78.0–100.0)
MONOS PCT: 10 % (ref 3–12)
Monocytes Absolute: 0.6 10*3/uL (ref 0.1–1.0)
NEUTROS ABS: 4.8 10*3/uL (ref 1.7–7.7)
NEUTROS PCT: 73 % (ref 43–77)
Platelets: 172 10*3/uL (ref 150–400)
RBC: 3.63 MIL/uL — ABNORMAL LOW (ref 4.22–5.81)
RDW: 13.6 % (ref 11.5–15.5)
WBC: 6.5 10*3/uL (ref 4.0–10.5)

## 2013-12-09 LAB — SAMPLE TO BLOOD BANK

## 2013-12-09 LAB — PROTIME-INR
INR: 1.06 (ref 0.00–1.49)
PROTHROMBIN TIME: 13.6 s (ref 11.6–15.2)

## 2013-12-09 LAB — PRO B NATRIURETIC PEPTIDE: Pro B Natriuretic peptide (BNP): 630.1 pg/mL — ABNORMAL HIGH (ref 0–450)

## 2013-12-09 LAB — SURGICAL PCR SCREEN
MRSA, PCR: NEGATIVE
Staphylococcus aureus: NEGATIVE

## 2013-12-09 SURGERY — ARTHROPLASTY, HIP, TOTAL,POSTERIOR APPROACH
Anesthesia: General | Site: Hip | Laterality: Left

## 2013-12-09 MED ORDER — FENTANYL CITRATE 0.05 MG/ML IJ SOLN
INTRAMUSCULAR | Status: AC
  Filled 2013-12-09: qty 5

## 2013-12-09 MED ORDER — SULFAMETHOXAZOLE-TMP DS 800-160 MG PO TABS
1.0000 | ORAL_TABLET | ORAL | Status: DC
  Administered 2013-12-10 – 2013-12-13 (×2): 1 via ORAL
  Filled 2013-12-09 (×2): qty 1

## 2013-12-09 MED ORDER — METHOCARBAMOL 500 MG PO TABS
500.0000 mg | ORAL_TABLET | Freq: Four times a day (QID) | ORAL | Status: DC | PRN
  Administered 2013-12-10 – 2013-12-11 (×2): 500 mg via ORAL
  Filled 2013-12-09 (×2): qty 1

## 2013-12-09 MED ORDER — MORPHINE SULFATE 2 MG/ML IJ SOLN
0.5000 mg | INTRAMUSCULAR | Status: DC | PRN
Start: 2013-12-09 — End: 2013-12-09
  Administered 2013-12-09: 0.5 mg via INTRAVENOUS
  Filled 2013-12-09: qty 1

## 2013-12-09 MED ORDER — PHENOL 1.4 % MT LIQD: 1.0000 | OROMUCOSAL | Status: DC | PRN

## 2013-12-09 MED ORDER — PROPOFOL 10 MG/ML IV BOLUS
INTRAVENOUS | Status: AC
  Filled 2013-12-09: qty 20

## 2013-12-09 MED ORDER — SUCCINYLCHOLINE CHLORIDE 20 MG/ML IJ SOLN
INTRAMUSCULAR | Status: DC | PRN
  Administered 2013-12-09: 100 mg via INTRAVENOUS

## 2013-12-09 MED ORDER — CITALOPRAM HYDROBROMIDE 20 MG PO TABS
20.0000 mg | ORAL_TABLET | Freq: Every day | ORAL | Status: DC
  Administered 2013-12-09 – 2013-12-13 (×5): 20 mg via ORAL
  Filled 2013-12-09 (×5): qty 1

## 2013-12-09 MED ORDER — HYDROCODONE-ACETAMINOPHEN 5-325 MG PO TABS: 1.0000 | ORAL_TABLET | ORAL | Status: DC | PRN

## 2013-12-09 MED ORDER — SODIUM CHLORIDE 0.9 % IV SOLN
INTRAVENOUS | Status: DC
  Administered 2013-12-09: 10:00:00 via INTRAVENOUS

## 2013-12-09 MED ORDER — ONDANSETRON HCL 4 MG PO TABS: 4.0000 mg | ORAL_TABLET | Freq: Four times a day (QID) | ORAL | Status: DC | PRN

## 2013-12-09 MED ORDER — 0.9 % SODIUM CHLORIDE (POUR BTL) OPTIME
TOPICAL | Status: DC | PRN
  Administered 2013-12-09: 1000 mL

## 2013-12-09 MED ORDER — HYDROMORPHONE HCL PF 2 MG/ML IJ SOLN
INTRAMUSCULAR | Status: AC
Start: 2013-12-09 — End: 2013-12-09
  Filled 2013-12-09: qty 1

## 2013-12-09 MED ORDER — ONDANSETRON HCL 4 MG/2ML IJ SOLN
INTRAMUSCULAR | Status: AC
  Filled 2013-12-09: qty 2

## 2013-12-09 MED ORDER — POLYETHYLENE GLYCOL 3350 17 G PO PACK
17.0000 g | PACK | Freq: Two times a day (BID) | ORAL | Status: DC
  Administered 2013-12-10 – 2013-12-12 (×5): 17 g via ORAL
  Filled 2013-12-09 (×9): qty 1

## 2013-12-09 MED ORDER — MENTHOL 3 MG MT LOZG: 1.0000 | LOZENGE | OROMUCOSAL | Status: DC | PRN

## 2013-12-09 MED ORDER — PHENYLEPHRINE HCL 10 MG/ML IJ SOLN
INTRAMUSCULAR | Status: DC | PRN
  Administered 2013-12-09 (×2): 80 ug via INTRAVENOUS

## 2013-12-09 MED ORDER — ISOSORBIDE MONONITRATE ER 60 MG PO TB24
120.0000 mg | ORAL_TABLET | Freq: Every day | ORAL | Status: DC
  Administered 2013-12-09: 120 mg via ORAL
  Filled 2013-12-09 (×2): qty 2

## 2013-12-09 MED ORDER — METOCLOPRAMIDE HCL 10 MG PO TABS: 5.0000 mg | ORAL_TABLET | Freq: Three times a day (TID) | ORAL | Status: DC | PRN

## 2013-12-09 MED ORDER — ONDANSETRON HCL 4 MG/2ML IJ SOLN
4.0000 mg | Freq: Once | INTRAMUSCULAR | Status: AC
  Administered 2013-12-09: 4 mg via INTRAVENOUS
  Filled 2013-12-09: qty 2

## 2013-12-09 MED ORDER — PROPOFOL 10 MG/ML IV BOLUS
INTRAVENOUS | Status: DC | PRN
  Administered 2013-12-09: 120 mg via INTRAVENOUS

## 2013-12-09 MED ORDER — FENTANYL CITRATE 0.05 MG/ML IJ SOLN
INTRAMUSCULAR | Status: DC | PRN
  Administered 2013-12-09: 50 ug via INTRAVENOUS
  Administered 2013-12-09: 100 ug via INTRAVENOUS
  Administered 2013-12-09: 50 ug via INTRAVENOUS

## 2013-12-09 MED ORDER — DEXTROSE 5 % IV SOLN
500.0000 mg | Freq: Four times a day (QID) | INTRAVENOUS | Status: DC | PRN
  Filled 2013-12-09: qty 5

## 2013-12-09 MED ORDER — MIDAZOLAM HCL 2 MG/2ML IJ SOLN
INTRAMUSCULAR | Status: AC
  Filled 2013-12-09: qty 2

## 2013-12-09 MED ORDER — NALOXONE HCL 0.4 MG/ML IJ SOLN
INTRAMUSCULAR | Status: DC | PRN
  Administered 2013-12-09: 80 ug via INTRAVENOUS

## 2013-12-09 MED ORDER — FENTANYL CITRATE 0.05 MG/ML IJ SOLN
50.0000 ug | INTRAMUSCULAR | Status: AC | PRN
  Administered 2013-12-09 (×2): 50 ug via INTRAVENOUS
  Filled 2013-12-09 (×2): qty 2

## 2013-12-09 MED ORDER — PHENYLEPHRINE 40 MCG/ML (10ML) SYRINGE FOR IV PUSH (FOR BLOOD PRESSURE SUPPORT)
PREFILLED_SYRINGE | INTRAVENOUS | Status: AC
  Filled 2013-12-09: qty 10

## 2013-12-09 MED ORDER — PROMETHAZINE HCL 25 MG/ML IJ SOLN: 6.2500 mg | INTRAMUSCULAR | Status: DC | PRN

## 2013-12-09 MED ORDER — MAGNESIUM CITRATE PO SOLN: 1.0000 | Freq: Once | ORAL | Status: AC | PRN

## 2013-12-09 MED ORDER — ENOXAPARIN SODIUM 40 MG/0.4ML ~~LOC~~ SOLN
40.0000 mg | SUBCUTANEOUS | Status: DC
  Administered 2013-12-10 – 2013-12-13 (×4): 40 mg via SUBCUTANEOUS
  Filled 2013-12-09 (×5): qty 0.4

## 2013-12-09 MED ORDER — NALOXONE HCL 0.4 MG/ML IJ SOLN
INTRAMUSCULAR | Status: AC
  Filled 2013-12-09: qty 1

## 2013-12-09 MED ORDER — MORPHINE SULFATE 2 MG/ML IJ SOLN: 0.5000 mg | INTRAMUSCULAR | Status: DC | PRN

## 2013-12-09 MED ORDER — HYDROMORPHONE HCL PF 1 MG/ML IJ SOLN: 0.2500 mg | INTRAMUSCULAR | Status: DC | PRN

## 2013-12-09 MED ORDER — ATENOLOL 50 MG PO TABS
50.0000 mg | ORAL_TABLET | Freq: Every day | ORAL | Status: DC
  Administered 2013-12-09 – 2013-12-13 (×5): 50 mg via ORAL
  Filled 2013-12-09 (×5): qty 1

## 2013-12-09 MED ORDER — HYDROCODONE-ACETAMINOPHEN 5-325 MG PO TABS
1.0000 | ORAL_TABLET | Freq: Four times a day (QID) | ORAL | Status: DC | PRN
  Administered 2013-12-10 – 2013-12-12 (×3): 1 via ORAL
  Administered 2013-12-12: 2 via ORAL
  Administered 2013-12-13: 1 via ORAL
  Filled 2013-12-09: qty 1
  Filled 2013-12-09: qty 2
  Filled 2013-12-09 (×4): qty 1

## 2013-12-09 MED ORDER — FENTANYL CITRATE 0.05 MG/ML IJ SOLN
50.0000 ug | Freq: Once | INTRAMUSCULAR | Status: AC
  Administered 2013-12-09: 50 ug via INTRAVENOUS
  Filled 2013-12-09: qty 2

## 2013-12-09 MED ORDER — SODIUM CHLORIDE 0.9 % IV SOLN
INTRAVENOUS | Status: DC
  Administered 2013-12-09 – 2013-12-11 (×3): via INTRAVENOUS
  Filled 2013-12-09 (×6): qty 1000

## 2013-12-09 MED ORDER — CEFAZOLIN SODIUM-DEXTROSE 2-3 GM-% IV SOLR
INTRAVENOUS | Status: AC
  Filled 2013-12-09: qty 50

## 2013-12-09 MED ORDER — BOOST PLUS PO LIQD
237.0000 mL | Freq: Two times a day (BID) | ORAL | Status: DC | PRN
  Filled 2013-12-09: qty 237

## 2013-12-09 MED ORDER — ASPIRIN EC 325 MG PO TBEC
325.0000 mg | DELAYED_RELEASE_TABLET | Freq: Every day | ORAL | Status: DC
  Administered 2013-12-09 – 2013-12-13 (×5): 325 mg via ORAL
  Filled 2013-12-09 (×5): qty 1

## 2013-12-09 MED ORDER — ONDANSETRON HCL 4 MG/2ML IJ SOLN
INTRAMUSCULAR | Status: DC | PRN
  Administered 2013-12-09: 4 mg via INTRAVENOUS

## 2013-12-09 MED ORDER — NITROGLYCERIN 0.4 MG SL SUBL: 0.4000 mg | SUBLINGUAL_TABLET | SUBLINGUAL | Status: DC | PRN

## 2013-12-09 MED ORDER — SUCCINYLCHOLINE CHLORIDE 20 MG/ML IJ SOLN
INTRAMUSCULAR | Status: AC
  Filled 2013-12-09: qty 1

## 2013-12-09 MED ORDER — SODIUM CHLORIDE 0.9 % IJ SOLN
INTRAMUSCULAR | Status: AC
  Filled 2013-12-09: qty 10

## 2013-12-09 MED ORDER — HYDROMORPHONE HCL PF 1 MG/ML IJ SOLN
INTRAMUSCULAR | Status: DC | PRN
  Administered 2013-12-09 (×2): 0.5 mg via INTRAVENOUS

## 2013-12-09 MED ORDER — LACTATED RINGERS IV SOLN
INTRAVENOUS | Status: DC
  Administered 2013-12-09: 1000 mL via INTRAVENOUS
  Administered 2013-12-09: 21:00:00 via INTRAVENOUS

## 2013-12-09 MED ORDER — ONDANSETRON HCL 4 MG/2ML IJ SOLN: 4.0000 mg | Freq: Four times a day (QID) | INTRAMUSCULAR | Status: DC | PRN

## 2013-12-09 MED ORDER — PRAVASTATIN SODIUM 40 MG PO TABS
80.0000 mg | ORAL_TABLET | Freq: Every day | ORAL | Status: DC
  Administered 2013-12-10 – 2013-12-12 (×3): 80 mg via ORAL
  Filled 2013-12-09 (×5): qty 2

## 2013-12-09 MED ORDER — CEFAZOLIN SODIUM 1-5 GM-% IV SOLN
1.0000 g | Freq: Four times a day (QID) | INTRAVENOUS | Status: AC
  Administered 2013-12-09 – 2013-12-10 (×2): 1 g via INTRAVENOUS
  Filled 2013-12-09 (×2): qty 50

## 2013-12-09 MED ORDER — FERROUS SULFATE 325 (65 FE) MG PO TABS
325.0000 mg | ORAL_TABLET | Freq: Three times a day (TID) | ORAL | Status: DC
  Administered 2013-12-10 – 2013-12-13 (×11): 325 mg via ORAL
  Filled 2013-12-09 (×13): qty 1

## 2013-12-09 MED ORDER — ACYCLOVIR 400 MG PO TABS
400.0000 mg | ORAL_TABLET | Freq: Two times a day (BID) | ORAL | Status: DC
  Administered 2013-12-09 – 2013-12-13 (×8): 400 mg via ORAL
  Filled 2013-12-09 (×10): qty 1

## 2013-12-09 MED ORDER — AMLODIPINE BESYLATE 5 MG PO TABS
5.0000 mg | ORAL_TABLET | Freq: Every day | ORAL | Status: DC
  Administered 2013-12-09 – 2013-12-13 (×5): 5 mg via ORAL
  Filled 2013-12-09 (×5): qty 1

## 2013-12-09 MED ORDER — ONDANSETRON HCL 4 MG/2ML IJ SOLN: 4.0000 mg | Freq: Three times a day (TID) | INTRAMUSCULAR | Status: AC | PRN

## 2013-12-09 MED ORDER — METOCLOPRAMIDE HCL 5 MG/ML IJ SOLN: 5.0000 mg | Freq: Three times a day (TID) | INTRAMUSCULAR | Status: DC | PRN

## 2013-12-09 MED ORDER — NAPROXEN 375 MG PO TABS
375.0000 mg | ORAL_TABLET | Freq: Two times a day (BID) | ORAL | Status: DC | PRN
  Filled 2013-12-09: qty 1

## 2013-12-09 MED ORDER — HYDROMORPHONE HCL PF 1 MG/ML IJ SOLN
0.5000 mg | INTRAMUSCULAR | Status: DC | PRN
  Administered 2013-12-09: 1 mg via INTRAVENOUS
  Filled 2013-12-09: qty 1

## 2013-12-09 MED ORDER — EPHEDRINE SULFATE 50 MG/ML IJ SOLN
INTRAMUSCULAR | Status: DC | PRN
  Administered 2013-12-09: 5 mg via INTRAVENOUS
  Administered 2013-12-09: 10 mg via INTRAVENOUS
  Administered 2013-12-09: 5 mg via INTRAVENOUS

## 2013-12-09 MED ORDER — HYDROCODONE-ACETAMINOPHEN 5-325 MG PO TABS: 1.0000 | ORAL_TABLET | Freq: Four times a day (QID) | ORAL | Status: DC | PRN

## 2013-12-09 MED ORDER — CEFAZOLIN SODIUM-DEXTROSE 2-3 GM-% IV SOLR
2.0000 g | Freq: Once | INTRAVENOUS | Status: AC
  Administered 2013-12-09: 2 g via INTRAVENOUS
  Filled 2013-12-09: qty 50

## 2013-12-09 MED ORDER — ROCURONIUM BROMIDE 100 MG/10ML IV SOLN
INTRAVENOUS | Status: AC
  Filled 2013-12-09: qty 1

## 2013-12-09 MED ORDER — DOCUSATE SODIUM 100 MG PO CAPS
100.0000 mg | ORAL_CAPSULE | Freq: Two times a day (BID) | ORAL | Status: DC
  Administered 2013-12-10 – 2013-12-13 (×6): 100 mg via ORAL
  Filled 2013-12-09 (×9): qty 1

## 2013-12-09 SURGICAL SUPPLY — 50 items
BAG ZIPLOCK 12X15 (MISCELLANEOUS) IMPLANT
BLADE SAW SGTL 18X1.27X75 (BLADE) ×2 IMPLANT
BLADE SAW SGTL 18X1.27X75MM (BLADE) ×1
CAPT HIP PF MOP ×3 IMPLANT
DERMABOND ADVANCED (GAUZE/BANDAGES/DRESSINGS) ×2
DERMABOND ADVANCED .7 DNX12 (GAUZE/BANDAGES/DRESSINGS) ×1 IMPLANT
DRAPE INCISE IOBAN 85X60 (DRAPES) ×3 IMPLANT
DRAPE ORTHO SPLIT 77X108 STRL (DRAPES) ×4
DRAPE POUCH INSTRU U-SHP 10X18 (DRAPES) ×3 IMPLANT
DRAPE SURG 17X11 SM STRL (DRAPES) ×3 IMPLANT
DRAPE SURG ORHT 6 SPLT 77X108 (DRAPES) ×2 IMPLANT
DRAPE U-SHAPE 47X51 STRL (DRAPES) ×3 IMPLANT
DRSG AQUACEL AG ADV 3.5X10 (GAUZE/BANDAGES/DRESSINGS) ×3 IMPLANT
DRSG TEGADERM 4X4.75 (GAUZE/BANDAGES/DRESSINGS) ×3 IMPLANT
DURAPREP 26ML APPLICATOR (WOUND CARE) ×3 IMPLANT
ELECT BLADE TIP CTD 4 INCH (ELECTRODE) ×3 IMPLANT
ELECT REM PT RETURN 9FT ADLT (ELECTROSURGICAL) ×3
ELECTRODE REM PT RTRN 9FT ADLT (ELECTROSURGICAL) ×1 IMPLANT
EVACUATOR 1/8 PVC DRAIN (DRAIN) IMPLANT
FACESHIELD WRAPAROUND (MASK) ×12 IMPLANT
GAUZE SPONGE 2X2 8PLY STRL LF (GAUZE/BANDAGES/DRESSINGS) ×1 IMPLANT
GLOVE BIO SURGEON STRL SZ7.5 (GLOVE) ×6 IMPLANT
GLOVE BIOGEL PI IND STRL 7.0 (GLOVE) ×1 IMPLANT
GLOVE BIOGEL PI IND STRL 7.5 (GLOVE) ×3 IMPLANT
GLOVE BIOGEL PI IND STRL 8 (GLOVE) ×2 IMPLANT
GLOVE BIOGEL PI INDICATOR 7.0 (GLOVE) ×2
GLOVE BIOGEL PI INDICATOR 7.5 (GLOVE) ×6
GLOVE BIOGEL PI INDICATOR 8 (GLOVE) ×4
GLOVE ECLIPSE 8.0 STRL XLNG CF (GLOVE) IMPLANT
GLOVE ORTHO TXT STRL SZ7.5 (GLOVE) ×6 IMPLANT
GLOVE SURG SS PI 6.5 STRL IVOR (GLOVE) ×3 IMPLANT
GLOVE SURG SS PI 7.5 STRL IVOR (GLOVE) ×3 IMPLANT
GOWN SPEC L3 XXLG W/TWL (GOWN DISPOSABLE) IMPLANT
GOWN STRL REUS W/TWL LRG LVL3 (GOWN DISPOSABLE) ×3 IMPLANT
HOLDER FOLEY CATH W/STRAP (MISCELLANEOUS) ×3 IMPLANT
KIT BASIN OR (CUSTOM PROCEDURE TRAY) ×3 IMPLANT
MANIFOLD NEPTUNE II (INSTRUMENTS) ×3 IMPLANT
NS IRRIG 1000ML POUR BTL (IV SOLUTION) ×3 IMPLANT
PACK TOTAL JOINT (CUSTOM PROCEDURE TRAY) ×3 IMPLANT
POSITIONER SURGICAL ARM (MISCELLANEOUS) ×3 IMPLANT
SPONGE GAUZE 2X2 STER 10/PKG (GAUZE/BANDAGES/DRESSINGS) ×2
SUCTION FRAZIER TIP 10 FR DISP (SUCTIONS) ×3 IMPLANT
SUT MNCRL AB 4-0 PS2 18 (SUTURE) ×3 IMPLANT
SUT VIC AB 1 CT1 36 (SUTURE) ×9 IMPLANT
SUT VIC AB 2-0 CT1 27 (SUTURE) ×4
SUT VIC AB 2-0 CT1 TAPERPNT 27 (SUTURE) ×2 IMPLANT
SUT VLOC 180 0 24IN GS25 (SUTURE) ×6 IMPLANT
TOWEL OR 17X26 10 PK STRL BLUE (TOWEL DISPOSABLE) ×6 IMPLANT
TRAY FOLEY CATH 14FRSI W/METER (CATHETERS) IMPLANT
WATER STERILE IRR 1500ML POUR (IV SOLUTION) ×3 IMPLANT

## 2013-12-09 NOTE — Care Management Note (Addendum)
    Page 1 of 1   12/13/2013     10:08:59 AM CARE MANAGEMENT NOTE 12/13/2013  Patient:  Martin Morris, Martin Morris   Account Number:  [id number]  Date Initiated:  12/09/2013  Documentation initiated by:  Dessa Phi  Subjective/Objective Assessment:   80 Y/O M ADMITTED W/L HIP FX.     Action/Plan:   FROM HOME W/SPOUSE.   Anticipated DC Date:  12/13/2013   Anticipated DC Plan:  [place]  CM consult      Choice offered to / List presented to:             Status of service:  Completed, signed off Medicare Important Message given?   (If response is "NO", the following Medicare IM given date fields will be blank) Date Medicare IM given:   Date Additional Medicare IM given:    Discharge Disposition:  [place]  Per UR Regulation:  Reviewed for med. necessity/level of care/duration of stay  If discussed at [place] of Stay Meetings, dates discussed:    Comments:  12/13/13 Jewelz Kobus RN,BSN NCM [phone] D/C SNF.  12/10/13 Kalene Cutler RN,BSN NCM [phone] PT-SNF.POD#1 L THA.D/C PLAN SNF.  12/09/13 Gabriele Loveland RN,BSN NCM [phone] ORTHO FOLLOWING.SX TODAY.AWAIT POST RECOMMENDATIONS.

## 2013-12-09 NOTE — ED Notes (Signed)
Per EMS pt from home, he was on way into house, trying to take a step up, lost balance and fell backwards, fell on butt then rest of way back, did not hit head, denies LOC, complaining of L hip pain, was unable to get up. + pulses and sensation.

## 2013-12-09 NOTE — H&P (Signed)
[facility] History and Physical  RAIHAN KIMMEL YIR:[id number] DOB: 01-Apr-1933 DOA: 12/09/2013  Referring physician: EDP PCP: [name], MD   Chief Complaint: Fall, hip pain   HPI: AKAASH VANDEWATER is a 80 y.o. male who presents after a mechanical fall while walking up a set of 3 stairs into his house this evening.  He lost balance, fell backwards and landed on L hip.  Since fall has had severe L hip pain, unable to ambulate.  In ED found to have L hip fracture.  He was also noted to be hypoxic on room air but states he is no more short of breath than baseline.  Review of Systems: Systems reviewed.  As above, otherwise negative  Past Medical History  Diagnosis Date  . Hypertension   . CAD 11/12/2007    a. s/p CABG;  b. Cath 7/11 Patent LIMA to the LAD. Previously known occlusion of a saphenous vein graft to diagonal and sequential to obtuse marginals. Severe diffuse native obtuse marginal and diagonal branch vessel disease. Preserved ejection fraction.;  c. 03/2013 Lexi CL: mild inflat and inf ischemia, EF 54%->initial med rx.  Marland Kitchen HYPERLIPIDEMIA 11/12/2007  . HYPERTENSION 11/12/2007  . PERIPHERAL VASCULAR DISEASE 11/12/2007  . ARTHRITIS 11/12/2007  . BENIGN PROSTATIC HYPERTROPHY, HX OF 11/12/2007  . Edema 10/17/2010  . INGUINAL HERNIAS, BILATERAL 11/10/2006  . Obesity, unspecified 07/05/2009  . SLEEP APNEA 11/12/2007  . History of PTCA 2005  . Osteoarthritis   . Carotid stenosis, bilateral   . Nephrolithiasis   . thymus ca dx'd 2003    Radiation comp 2003  . NHL (non-Hodgkin's lymphoma) dx'd 2008    Chemo comp 10/2010; rituxin comp 10/2010  . 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 MANTLE CELL LYMPHOMA INTRA-ABDOMINAL LYMPH NODES 11/12/2007   Past Surgical History  Procedure Laterality Date  . Coronary artery bypass graft    . Cardiac catheterization  08/2009, 02/2010   Social History:  reports that he has never smoked. He has never used smokeless tobacco. He reports that he  does not drink alcohol or use illicit drugs.  Allergies  Allergen Reactions  . Atorvastatin     Incredible dizziness    Family History  Problem Relation Age of Onset  . Coronary artery disease Mother     died at 56  . Heart attack Sister     died at age 27  . Heart attack Brother      Prior to Admission medications   Medication Sig Start Date End Date Taking? Authorizing Provider  acyclovir (ZOVIRAX) 400 MG tablet Take 1 tablet (400 mg total) by mouth 2 (two) times daily. 09/30/13  Yes Concha Norway, MD  amLODipine (NORVASC) 5 MG tablet Take 1 tablet (5 mg total) by mouth daily. 04/09/13  Yes Rogelia Mire, NP  citalopram (CELEXA) 20 MG tablet TAKE ONE TABLET BY MOUTH ONCE DAILY 12/06/13  Yes Concha Norway, MD  naproxen sodium (ANAPROX) 220 MG tablet Take 440 mg by mouth 2 (two) times daily as needed. For pain.   Yes Historical Provider, MD  sulfamethoxazole-trimethoprim (BACTRIM DS) 800-160 MG per tablet Take 1 tablet by mouth every Monday, Wednesday, and Friday. 06/21/13  Yes Concha Norway, MD  aspirin 81 MG tablet Take 81 mg by mouth every other day.    Historical Provider, MD  atenolol (TENORMIN) 50 MG tablet Take 50 mg by mouth daily.    Historical Provider, MD  clopidogrel (PLAVIX) 75 MG tablet Take 75 mg by mouth daily.  Historical Provider, MD  isosorbide mononitrate (IMDUR) 120 MG 24 hr tablet Take 1 tablet (120 mg total) by mouth daily. 11/24/13   Minus Breeding, MD  lidocaine-prilocaine (EMLA) cream Apply 1 application topically See admin instructions. Apply to port 1 hour before treatment. Cover site with plastic wrap    Historical Provider, MD  nitroGLYCERIN (NITROSTAT) 0.4 MG SL tablet Place 0.4 mg under the tongue every 5 (five) minutes as needed for chest pain.    Historical Provider, MD  pravastatin (PRAVACHOL) 80 MG tablet Take 1 tablet (80 mg total) by mouth daily. 09/28/13   Minus Breeding, MD  PRESCRIPTION MEDICATION Inject into the vein every 21 ( twenty-one) days.  Velcade and Cytoxan.    Historical Provider, MD   Physical Exam: Filed Vitals:   12/09/13 0230  BP: 138/76  Pulse: 65  Temp:   Resp: 15    BP 138/76  Pulse 65  Temp(Src) 98.3 F (36.8 C) (Oral)  Resp 15  SpO2 92%  General Appearance:    Alert, oriented, uncomfortable, appears stated age  Head:    Normocephalic, atraumatic  Eyes:    PERRL, EOMI, sclera non-icteric        Nose:   Nares without drainage or epistaxis. Mucosa, turbinates normal  Throat:   Moist mucous membranes. Oropharynx without erythema or exudate.  Neck:   Supple. No carotid bruits.  No thyromegaly.  No lymphadenopathy.   Back:     No CVA tenderness, no spinal tenderness  Lungs:     Clear to auscultation bilaterally, without wheezes, rhonchi or rales  Chest wall:    No tenderness to palpitation  Heart:    Regular rate and rhythm without murmurs, gallops, rubs  Abdomen:     Soft, non-tender, nondistended, normal bowel sounds, no organomegaly  Genitalia:    deferred  Rectal:    deferred  Extremities:   BLE edema at baseline.  Pulses:   2+ and symmetric all extremities  Skin:   Skin color, texture, turgor normal, no rashes or lesions  Lymph nodes:   Cervical, supraclavicular, and axillary nodes normal  Neurologic:   CNII-XII intact. Normal strength, sensation and reflexes      throughout    Labs on Admission:  Basic Metabolic Panel:  Recent Labs Lab 12/09/13 0130  NA 140  K 3.8  CL 102  CO2 25  GLUCOSE 135*  BUN 24*  CREATININE 1.33  CALCIUM 9.1   Liver Function Tests: No results found for this basename: AST, ALT, ALKPHOS, BILITOT, PROT, ALBUMIN,  in the last 168 hours No results found for this basename: LIPASE, AMYLASE,  in the last 168 hours No results found for this basename: AMMONIA,  in the last 168 hours CBC:  Recent Labs Lab 12/09/13 0130  WBC 6.5  NEUTROABS 4.8  HGB 11.9*  HCT 36.0*  MCV 99.2  PLT 172   Cardiac Enzymes: No results found for this basename: CKTOTAL, CKMB,  CKMBINDEX, TROPONINI,  in the last 168 hours  BNP (last 3 results)  Recent Labs  12/09/13 0130  PROBNP 630.1*   CBG: No results found for this basename: GLUCAP,  in the last 168 hours  Radiological Exams on Admission: Dg Chest 1 View  12/09/2013   CLINICAL DATA:  Left hip pain  EXAM: CHEST - 1 VIEW  COMPARISON:  None.  FINDINGS: There is elevation of the right diaphragm. There is no focal parenchymal opacity, pleural effusion, or pneumothorax. The heart and mediastinal contours are unremarkable. Prior CABG.  Right-sided Port-A-Cath in satisfactory position.  The osseous structures are unremarkable.  IMPRESSION: No active disease.   Electronically Signed   By: Kathreen Devoid   On: 12/09/2013 01:56   Dg Hip Complete Left  12/09/2013   CLINICAL DATA:  Fall  EXAM: LEFT HIP - COMPLETE 2+ VIEW  COMPARISON:  None.  FINDINGS: There is a mildly displaced left femoral neck fracture. There is no dislocation. The right hip is unremarkable.  IMPRESSION: Mildly displaced left femoral neck fracture.   Electronically Signed   By: Kathreen Devoid   On: 12/09/2013 01:55    EKG: Independently reviewed.  Assessment/Plan Principal Problem:   Fracture of femoral neck, left Active Problems:   MANTLE CELL LYMPHOMA INTRA-ABDOMINAL LYMPH NODES   CAD   1. L femoral neck fracture - patient on hip fracture pathway, ortho called and planning on doing surgery later today. 2. CAD - Patient has extensive history of severe CAD.  He is short of breath with activity, but no more so than baseline and he has been in the past.  He recently had a heart cath in August of last year which demonstrated severe disease in multiple arteries, 2 out of his 3 CABG grafts were down, but nothing stentable other than the LIMA, they chose not to stent the LIMA however as it was not the area that demonstrated ischemia on the perfusion study that he had just prior to that heart cath.  Had an extensive discussion with patient and family, but at  this point I dont think that there is much cards will do to further lower his cardiac risk prior to surgery.  He will be at least moderate cardiac risk for the surgery given this history; however, given the very poor prognosis and QoL associated with non-operative management of hip fractures, I did advise them to proceed with the procedure.  Ortho should try to minimize fluid shifts in this patient. 3. Mantle cell lymphoma - CT scan done earlier this month actually demonstrated no findings for recurrent lymphoma in chest, abd/pelvis.  Patient was actually due to have chemo today, anticipate that this will be postponed due to femoral neck fracture, routine consult put into computer system for oncology to let them know patient is being admitted.  Code Status: Full Code  Family Communication: Wife at bedside, all questions answered Disposition Plan: Admit to inpatient   Time spent: 33 min  [facility] Pager [phone]  If 7AM-7PM, please contact the day team taking care of the patient [email or web address] Password [facility] 12/09/2013, 3:28 AM

## 2013-12-09 NOTE — Progress Notes (Signed)
UR completed 

## 2013-12-09 NOTE — Anesthesia Postprocedure Evaluation (Signed)
  Anesthesia Post-op Note  Patient: Martin Morris  Procedure(s) Performed: Procedure(s) (LRB): TOTAL HIP ARTHROPLASTY (Left)  Patient Location: PACU  Anesthesia Type: General  Level of Consciousness: awake and alert   Airway and Oxygen Therapy: Patient Spontanous Breathing  Post-op Pain: mild  Post-op Assessment: Post-op Vital signs reviewed, Patient's Cardiovascular Status Stable, Respiratory Function Stable, Patent Airway and No signs of Nausea or vomiting  Gave 76mcg of narcan in PACU secondary to respiratory depression. Improved gas exchange and oxygen saturation  Last Vitals:  Filed Vitals:   12/09/13 2016  BP: 137/73  Pulse: 75  Temp:   Resp: 11    Post-op Vital Signs: stable   Complications: No apparent anesthesia complications

## 2013-12-09 NOTE — Transfer of Care (Signed)
Immediate Anesthesia Transfer of Care Note  Patient: Martin Morris  Procedure(s) Performed: Procedure(s) (LRB): TOTAL HIP ARTHROPLASTY (Left)  Patient Location: PACU  Anesthesia Type: General  Level of Consciousness: sedated, patient cooperative and responds to stimulation  Airway & Oxygen Therapy: Patient Spontanous Breathing and Patient connected to face mask oxgen  Post-op Assessment: Report given to PACU RN and Post -op Vital signs reviewed and stable  Post vital signs: Reviewed and stable  Complications: No apparent anesthesia complications

## 2013-12-09 NOTE — ED Provider Notes (Addendum)
CSN: [id number]     Arrival date & time 12/09/13  0024 History   First MD Initiated Contact with Patient 12/09/13 [phone]     Chief Complaint  Patient presents with  . Fall  . Hip Pain     (Consider location/radiation/quality/duration/timing/severity/associated sxs/prior Treatment) HPI 80 year old male presents to emergency department from home via EMS after a fall.  Patient had mechanical fall, was walking up a set of 3 stairs into the house when he lost his balance.  Patient fell back.  Since falling, he's been unable stand.  Is complaining of severe left hip pain.  Patient has had increased falls recently, but usually is able to get up with his wife's assistance.  Past medical history of hypertension, coronary disease, status post CABG, hyperlipidemia, lymphoma (currently in remission), peripheral edema, sleep apnea.  Of note, patient noted to be hypoxic upon arrival.  Patient has baseline shortness of breath, but denies it is any worse than recently.  He does not normally wear oxygen.  He denies any chest pain.  Patient had screening CAT scans done on the ninth of this month, no recurrence of cancer noted on radiologist report. Patient denies striking his head, no loss of consciousness.  No other injuries. Past Medical History  Diagnosis Date  . Hypertension   . CAD 11/12/2007    a. s/p CABG;  b. Cath 7/11 Patent LIMA to the LAD. Previously known occlusion of a saphenous vein graft to diagonal and sequential to obtuse marginals. Severe diffuse native obtuse marginal and diagonal branch vessel disease. Preserved ejection fraction.;  c. 03/2013 Lexi CL: mild inflat and inf ischemia, EF 54%->initial med rx.  Marland Kitchen HYPERLIPIDEMIA 11/12/2007  . HYPERTENSION 11/12/2007  . PERIPHERAL VASCULAR DISEASE 11/12/2007  . ARTHRITIS 11/12/2007  . BENIGN PROSTATIC HYPERTROPHY, HX OF 11/12/2007  . Edema 10/17/2010  . INGUINAL HERNIAS, BILATERAL 11/10/2006  . Obesity, unspecified 07/05/2009  . SLEEP APNEA 11/12/2007  .  History of PTCA 2005  . Osteoarthritis   . Carotid stenosis, bilateral   . Nephrolithiasis   . thymus ca dx'd 2003    Radiation comp 2003  . NHL (non-Hodgkin's lymphoma) dx'd 2008    Chemo comp 10/2010; rituxin comp 10/2010  . 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 MANTLE CELL LYMPHOMA INTRA-ABDOMINAL LYMPH NODES 11/12/2007   Past Surgical History  Procedure Laterality Date  . Coronary artery bypass graft    . Cardiac catheterization  08/2009, 02/2010   Family History  Problem Relation Age of Onset  . Coronary artery disease Mother     died at 26  . Heart attack Sister     died at age 38  . Heart attack Brother    History  Substance Use Topics  . Smoking status: Never Smoker   . Smokeless tobacco: Never Used  . Alcohol Use: No    Review of Systems   See History of Present Illness; otherwise all other systems are reviewed and negative  Allergies  Atorvastatin  Home Medications   Prior to Admission medications   Medication Sig Start Date End Date Taking? Authorizing Provider  acyclovir (ZOVIRAX) 400 MG tablet Take 1 tablet (400 mg total) by mouth 2 (two) times daily. 09/30/13  Yes Concha Norway, MD  amLODipine (NORVASC) 5 MG tablet Take 1 tablet (5 mg total) by mouth daily. 04/09/13  Yes Rogelia Mire, NP  citalopram (CELEXA) 20 MG tablet TAKE ONE TABLET BY MOUTH ONCE DAILY 12/06/13  Yes Concha Norway, MD  naproxen sodium (ANAPROX) 220 MG  tablet Take 440 mg by mouth 2 (two) times daily as needed. For pain.   Yes Historical Provider, MD  sulfamethoxazole-trimethoprim (BACTRIM DS) 800-160 MG per tablet Take 1 tablet by mouth every Monday, Wednesday, and Friday. 06/21/13  Yes Concha Norway, MD  aspirin 81 MG tablet Take 81 mg by mouth every other day.    Historical Provider, MD  atenolol (TENORMIN) 50 MG tablet Take 50 mg by mouth daily.    Historical Provider, MD  clopidogrel (PLAVIX) 75 MG tablet Take 75 mg by mouth daily.    Historical Provider, MD  isosorbide mononitrate (IMDUR) 120 MG  24 hr tablet Take 1 tablet (120 mg total) by mouth daily. 11/24/13   Minus Breeding, MD  lidocaine-prilocaine (EMLA) cream Apply 1 application topically See admin instructions. Apply to port 1 hour before treatment. Cover site with plastic wrap    Historical Provider, MD  nitroGLYCERIN (NITROSTAT) 0.4 MG SL tablet Place 0.4 mg under the tongue every 5 (five) minutes as needed for chest pain.    Historical Provider, MD  pravastatin (PRAVACHOL) 80 MG tablet Take 1 tablet (80 mg total) by mouth daily. 09/28/13   Minus Breeding, MD  PRESCRIPTION MEDICATION Inject into the vein every 21 ( twenty-one) days. Velcade and Cytoxan.    Historical Provider, MD   BP 151/74  Pulse 69  Temp(Src) 98.3 F (36.8 C) (Oral)  Resp 22  SpO2 94% Physical Exam  Nursing note and vitals reviewed. Constitutional: He is oriented to person, place, and time. He appears well-developed and well-nourished. He appears distressed (uncomfortable appearing).  HENT:  Head: Normocephalic and atraumatic.  Right Ear: External ear normal.  Left Ear: External ear normal.  Nose: Nose normal.  Mouth/Throat: Oropharynx is clear and moist.  Eyes: Conjunctivae and EOM are normal. Pupils are equal, round, and reactive to light.  Neck: Normal range of motion. Neck supple. No JVD present. No tracheal deviation present. No thyromegaly present.  Cardiovascular: Normal rate, regular rhythm, normal heart sounds and intact distal pulses.  Exam reveals no gallop and no friction rub.   No murmur heard. Pulmonary/Chest: Effort normal and breath sounds normal. No stridor. No respiratory distress. He has no wheezes. He has no rales. He exhibits no tenderness.  Mild dyspnea noted on exam  Abdominal: Soft. Bowel sounds are normal. He exhibits no distension and no mass. There is no tenderness. There is no rebound and no guarding.  Musculoskeletal: Normal range of motion. He exhibits edema (edema bilaterally, right greater than left, but at the patient's  baseline) and tenderness (tenderness with any attempt at range of motion at left hip.  Patient has distal pulses and is neurovascularly intact).  Lymphadenopathy:    He has no cervical adenopathy.  Neurological: He is alert and oriented to person, place, and time. He exhibits normal muscle tone. Coordination normal.  Skin: Skin is warm and dry. No rash noted. No erythema. No pallor.  Psychiatric: He has a normal mood and affect. His behavior is normal. Judgment and thought content normal.    ED Course  Procedures (including critical care time) Labs Review Labs Reviewed  CBC WITH DIFFERENTIAL - Abnormal; Notable for the following:    RBC 3.63 (*)    Hemoglobin 11.9 (*)    HCT 36.0 (*)    All other components within normal limits  BASIC METABOLIC PANEL - Abnormal; Notable for the following:    Glucose, Bld 135 (*)    BUN 24 (*)    GFR calc non  Af Amer 49 (*)    GFR calc Af Amer 57 (*)    All other components within normal limits  PRO B NATRIURETIC PEPTIDE - Abnormal; Notable for the following:    Pro B Natriuretic peptide (BNP) 630.1 (*)    All other components within normal limits  URINALYSIS, ROUTINE W REFLEX MICROSCOPIC  PROTIME-INR  SAMPLE TO BLOOD BANK    Imaging Review Dg Chest 1 View  12/09/2013   CLINICAL DATA:  Left hip pain  EXAM: CHEST - 1 VIEW  COMPARISON:  None.  FINDINGS: There is elevation of the right diaphragm. There is no focal parenchymal opacity, pleural effusion, or pneumothorax. The heart and mediastinal contours are unremarkable. Prior CABG. Right-sided Port-A-Cath in satisfactory position.  The osseous structures are unremarkable.  IMPRESSION: No active disease.   Electronically Signed   By: Kathreen Devoid   On: 12/09/2013 01:56   Dg Hip Complete Left  12/09/2013   CLINICAL DATA:  Fall  EXAM: LEFT HIP - COMPLETE 2+ VIEW  COMPARISON:  None.  FINDINGS: There is a mildly displaced left femoral neck fracture. There is no dislocation. The right hip is unremarkable.   IMPRESSION: Mildly displaced left femoral neck fracture.   Electronically Signed   By: Kathreen Devoid   On: 12/09/2013 01:55     EKG Interpretation   Date/Time:  Thursday December 09 2013 01:11:48 EDT Ventricular Rate:  69 PR Interval:  172 QRS Duration: 114 QT Interval:  483 QTC Calculation: 517 R Axis:   31 Text Interpretation:  Sinus rhythm Borderline intraventricular conduction  delay Repol abnrm suggests ischemia, lateral leads Prolonged QT interval  Poor data quality in current ECG precludes serial comparison Confirmed by  Kyal Arts  MD, Brandye Inthavong ([id number]) on 12/09/2013 1:57:51 AM      MDM   Final diagnoses:  Fracture of femoral neck, left, closed   80 year old male status post fall with left femoral neck fracture.  Patient will need admission to the medicine service, cardiac clearance, and surgery.  We'll discuss with on-call on hi orthopedics, and hospitalist.  m    Kalman Drape, MD 12/09/13 0231  2:35 AM D/w Dr Veverly Fells, who requests pt be made NPO and plavix held, will see in am.  Kalman Drape, MD 12/09/13 [phone]

## 2013-12-09 NOTE — ED Notes (Signed)
Pt states was going into the house when he went to step up and lost his balance, fell backwards on butt, denies hitting head or LOC, pt complaining of L hip pain, pt also states has not taken any of his nighttime medications.

## 2013-12-09 NOTE — ED Notes (Signed)
Bed: WA04 Expected date:  Expected time:  Means of arrival:  Comments: EMS/81 yo male with fall/left hip pain

## 2013-12-09 NOTE — Progress Notes (Signed)
   Asked to see Martin Morris to help with his hip fracture Admitted last night after an unfortunately fall, ON HIS BIRTHDAY!  Left femoral neck fracture Pain in his left hip prior to fall in whole hip region  Reviewed plans, options Plan as discussed is to proceed with a left total hip replacement to address the left hip fracture and the hip pain related to pre-operative arthritic changes NPO Consent on chart

## 2013-12-09 NOTE — Progress Notes (Addendum)
[facility] PROGRESS NOTE  Martin Morris WIO:[id number] DOB: [birth year] DOA: 12/09/2013 PCP: [name], MD  Assessment/Plan: Principal Problem:   Fracture of femoral neck, left: For surgery tonight Active Problems:   MANTLE CELL LYMPHOMA INTRA-ABDOMINAL LYMPH NODES: Missing this round of chemotherapy    HYPERLIPIDEMIA: Stable   HYPERTENSION: Stable. Blood pressure elevated likely secondary to pain   CAD: Stable   SLEEP APNEA : Monitor. Continue nightly CPAP  Obesity: Patient meets criteria with BMI greater than 30  Code Status:  Full code Family Communication: Plan discussed with patient's wife at the bedside  Disposition Plan: Following surgery, likely skilled nursing   Consultants:  [facility]-orthopedic surgery  Procedures:  For hip repair later today  Antibiotics:  None  HPI/Subjective: Patient seen in the morning prior to surgery. Complaining of moderate pain in his hip, chronic shortness of breath but nothing acute   Objective: Filed Vitals:   12/09/13 0408  BP: 160/90  Pulse: 67  Temp: 97.9 F (36.6 C)  Resp: 20    Intake/Output Summary (Last 24 hours) at 12/09/13 1256 Last data filed at 12/09/13 0539  Gross per 24 hour  Intake      0 ml  Output    500 ml  Net   -500 ml   Filed Weights   12/09/13 0408  Weight: 94.3 kg (207 lb 14.3 oz)    Exam:   General:  Alert and oriented x3, moderate distress secondary to hip pain  Cardiovascular: Regular rate and rhythm, S1-S2  Respiratory: Clear to auscultation bilaterally  Abdomen: Soft, nontender, nondistended, positive bowel sounds  Musculoskeletal: No clubbing or cyanosis, or edema   Data Reviewed: Basic Metabolic Panel:  Recent Labs Lab 12/09/13 0130  NA 140  K 3.8  CL 102  CO2 25  GLUCOSE 135*  BUN 24*  CREATININE 1.33  CALCIUM 9.1   Liver Function Tests: No results found for this basename: AST, ALT, ALKPHOS, BILITOT, PROT, ALBUMIN,  in the last 168 hours No  results found for this basename: LIPASE, AMYLASE,  in the last 168 hours No results found for this basename: AMMONIA,  in the last 168 hours CBC:  Recent Labs Lab 12/09/13 0130  WBC 6.5  NEUTROABS 4.8  HGB 11.9*  HCT 36.0*  MCV 99.2  PLT 172   Cardiac Enzymes: No results found for this basename: CKTOTAL, CKMB, CKMBINDEX, TROPONINI,  in the last 168 hours BNP (last 3 results)  Recent Labs  12/09/13 0130  PROBNP 630.1*   CBG: No results found for this basename: GLUCAP,  in the last 168 hours  No results found for this or any previous visit (from the past 240 hour(s)).   Studies: Dg Chest 1 View  12/09/2013   CLINICAL DATA:  Left hip pain  EXAM: CHEST - 1 VIEW  COMPARISON:  None.  FINDINGS: There is elevation of the right diaphragm. There is no focal parenchymal opacity, pleural effusion, or pneumothorax. The heart and mediastinal contours are unremarkable. Prior CABG. Right-sided Port-A-Cath in satisfactory position.  The osseous structures are unremarkable.  IMPRESSION: No active disease.   Electronically Signed   By: Kathreen Devoid   On: 12/09/2013 01:56   Dg Hip Complete Left  12/09/2013   CLINICAL DATA:  Fall  EXAM: LEFT HIP - COMPLETE 2+ VIEW  COMPARISON:  None.  FINDINGS: There is a mildly displaced left femoral neck fracture. There is no dislocation. The right hip is unremarkable.  IMPRESSION: Mildly displaced left femoral neck fracture.  Electronically Signed   By: Kathreen Devoid   On: 12/09/2013 01:55    Scheduled Meds: . acyclovir  400 mg Oral BID  . amLODipine  5 mg Oral Daily  . aspirin EC  325 mg Oral Daily  . atenolol  50 mg Oral Daily  . citalopram  20 mg Oral Daily  . isosorbide mononitrate  120 mg Oral Daily  . pravastatin  80 mg Oral q1800  . [START ON 12/10/2013] sulfamethoxazole-trimethoprim  1 tablet Oral Q M,W,F   Continuous Infusions: . sodium chloride 100 mL/hr at 12/09/13 0941    Principal Problem:   Fracture of femoral neck, left Active  Problems:   MANTLE CELL LYMPHOMA INTRA-ABDOMINAL LYMPH NODES   HYPERLIPIDEMIA   HYPERTENSION   CAD   SLEEP APNEA    Time spent: 20 minutes    [facility] Pager [phone]. If 7PM-7AM, please contact night-coverage at [email or web address], password [facility] 12/09/2013, 12:56 PM  LOS: 0 days

## 2013-12-09 NOTE — Progress Notes (Signed)
INITIAL NUTRITION ASSESSMENT  DOCUMENTATION CODES Per approved criteria  -Obesity Unspecified   INTERVENTION: -Recommend Boost Plus PRN -Will continue to montior  NUTRITION DIAGNOSIS: Increased nutrient needs (protein/kcal) related to increased demand for nutrients as evidenced by hip fracture.   Goal: Pt to meet >/= 90% of their estimated nutrition needs    Monitor:  Diet order, total protein/energy intake, labs, weights  Reason for Assessment: Consult  80 y.o. male  Admitting Dx: Fracture of femoral neck, left  ASSESSMENT: Martin Morris is a 80 y.o. male who presents after a mechanical fall while walking up a set of 3 stairs into his house this evening. He lost balance, fell backwards and landed on L hip. Since fall has had severe L hip pain, unable to ambulate.  -Pt NPO pending total hip replacement vs left hip hemiarthroplasty -Wife denied any changes in appetite or weight pta. Diet recall indicates pt consuming 2-3 meals/day. Pt will also have 1-2 snacks daily. -Denied any nausea/abd pain or dysphagia -Wife reported that pt will drink Boost occasionally; however not on a regular daily basis -Discussed need for additional kcal/protein during therapy/recovery process of hip fractures. Wife verbalized understanding and believed that pt will likely meet estimated nutrition needs w/current appetite -Will add Boost PRN; relayed to wife that pt would be able to receive supplement per her or pt's request if he exhibits decreased appetite post procedure  (<50% meal completion or skipping meals) -Pt's weight increased 4 lbs in past 3 months, likely r/t +3 edema in RLE and LLE   Height: Ht Readings from Last 1 Encounters:  12/09/13 5\' 9"  (1.753 m)    Weight: Wt Readings from Last 1 Encounters:  12/09/13 207 lb 14.3 oz (94.3 kg)    Ideal Body Weight: 160 lbs  % Ideal Body Weight: 129%  Wt Readings from Last 10 Encounters:  12/09/13 207 lb 14.3 oz (94.3 kg)  10/28/13  205 lb 8 oz (93.214 kg)  09/16/13 203 lb 9.6 oz (92.352 kg)  08/05/13 206 lb 12.8 oz (93.804 kg)  07/08/13 205 lb 14.4 oz (93.396 kg)  06/24/13 206 lb 6.4 oz (93.622 kg)  06/03/13 204 lb 12.8 oz (92.897 kg)  05/11/13 207 lb (93.895 kg)  05/06/13 205 lb 3.2 oz (93.078 kg)  04/13/13 205 lb (92.987 kg)    Usual Body Weight: 205 lbs per previous med records  % Usual Body Weight: 100%  BMI:  Body mass index is 30.69 kg/(m^2). Obesity I  Estimated Nutritional Needs: Kcal: 2000-2200 Protein: 95-105 gram Fluid: >/=2200 ml/daily  Skin: +3 edemas RLE and LLE  Diet Order: NPO  EDUCATION NEEDS: -Education needs addressed   Intake/Output Summary (Last 24 hours) at 12/09/13 1101 Last data filed at 12/09/13 0539  Gross per 24 hour  Intake      0 ml  Output    500 ml  Net   -500 ml    Last BM: 4/15   Labs:   Recent Labs Lab 12/09/13 0130  NA 140  K 3.8  CL 102  CO2 25  BUN 24*  CREATININE 1.33  CALCIUM 9.1  GLUCOSE 135*    CBG (last 3)  No results found for this basename: GLUCAP,  in the last 72 hours  Scheduled Meds: . acyclovir  400 mg Oral BID  . amLODipine  5 mg Oral Daily  . aspirin EC  325 mg Oral Daily  . atenolol  50 mg Oral Daily  . citalopram  20 mg Oral Daily  .  isosorbide mononitrate  120 mg Oral Daily  . pravastatin  80 mg Oral q1800  . [START ON 12/10/2013] sulfamethoxazole-trimethoprim  1 tablet Oral Q M,W,F    Continuous Infusions: . sodium chloride 100 mL/hr at 12/09/13 0941    Past Medical History  Diagnosis Date  . Hypertension   . CAD 11/12/2007    a. s/p CABG;  b. Cath 7/11 Patent LIMA to the LAD. Previously known occlusion of a saphenous vein graft to diagonal and sequential to obtuse marginals. Severe diffuse native obtuse marginal and diagonal branch vessel disease. Preserved ejection fraction.;  c. 03/2013 Lexi CL: mild inflat and inf ischemia, EF 54%->initial med rx.  Marland Kitchen HYPERLIPIDEMIA 11/12/2007  . HYPERTENSION 11/12/2007  .  PERIPHERAL VASCULAR DISEASE 11/12/2007  . ARTHRITIS 11/12/2007  . BENIGN PROSTATIC HYPERTROPHY, HX OF 11/12/2007  . Edema 10/17/2010  . INGUINAL HERNIAS, BILATERAL 11/10/2006  . Obesity, unspecified 07/05/2009  . SLEEP APNEA 11/12/2007  . History of PTCA 2005  . Osteoarthritis   . Carotid stenosis, bilateral   . Nephrolithiasis   . thymus ca dx'd 2003    Radiation comp 2003  . NHL (non-Hodgkin's lymphoma) dx'd 2008    Chemo comp 10/2010; rituxin comp 10/2010  . 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 MANTLE CELL LYMPHOMA INTRA-ABDOMINAL LYMPH NODES 11/12/2007    Past Surgical History  Procedure Laterality Date  . Coronary artery bypass graft    . Cardiac catheterization  08/2009, 02/2010    Atlee Abide Harborton LDN Clinical Dietitian [phone]

## 2013-12-09 NOTE — Telephone Encounter (Signed)
Pam-wife called to inform us that pt fell last evening and broke his hip. He will have surgery today but waiting for cardiac clearance. His oxygen was low when they arrived in the ED. Dr. Juliann Mule updated.

## 2013-12-09 NOTE — Anesthesia Preprocedure Evaluation (Addendum)
Anesthesia Evaluation  Patient identified by MRN, date of birth, ID band Patient awake    Reviewed: Allergy & Precautions, H&P , NPO status , Patient's Chart, lab work & pertinent test results  Airway Mallampati: III TM Distance: <3 FB Neck ROM: Limited    Dental no notable dental hx.    Pulmonary sleep apnea ,  breath sounds clear to auscultation  Pulmonary exam normal       Cardiovascular hypertension, Pt. on medications + CAD, + CABG and + Peripheral Vascular Disease Rhythm:Regular Rate:Normal  /19/2009 a. s/p CABG;  b. Cath 7/11 Patent LIMA to the LAD. Previously known occlusion of a saphenous vein graft to diagonal and sequential to obtuse marginals. Severe diffuse native obtuse marginal and diagonal branch vessel disease. Preserved ejection fraction.;  c. 03/2013 Lexi CL: mild inflat and inf ischemia, EF 54%->initial med     Neuro/Psych negative neurological ROS  negative psych ROS   GI/Hepatic negative GI ROS, Neg liver ROS,   Endo/Other  negative endocrine ROS  Renal/GU negative Renal ROS  negative genitourinary   Musculoskeletal negative musculoskeletal ROS (+)   Abdominal   Peds negative pediatric ROS (+)  Hematology   Anesthesia Other Findings   Reproductive/Obstetrics negative OB ROS                         Anesthesia Physical Anesthesia Plan  ASA: III  Anesthesia Plan: General   Post-op Pain Management:    Induction: Intravenous  Airway Management Planned: Oral ETT and Video Laryngoscope Planned  Additional Equipment:   Intra-op Plan:   Post-operative Plan: Extubation in OR  Informed Consent: I have reviewed the patients History and Physical, chart, labs and discussed the procedure including the risks, benefits and alternatives for the proposed anesthesia with the patient or authorized representative who has indicated his/her understanding and acceptance.   Dental  advisory given  Plan Discussed with: CRNA and Surgeon  Anesthesia Plan Comments:        Anesthesia Quick Evaluation

## 2013-12-09 NOTE — Consult Note (Signed)
Reason for Consult:left hip fracture Referring Physician: Sheran Luz MD  Martin Morris is an 80 y.o. male.  HPI: 80 yo male s/p mechanical fall on stairs late yesterday after visiting with family for his birthday.  Patient reported immediate and severe left hip pain.  Patient unable to stand after fall.  Patient transported to [facility] ED for eval and treatment.  Patient does report that he has had hip and groin pain before the fall that was significant. Denied SOB or Chest Pain or Dizziness associated with the fall.  Past Medical History  Diagnosis Date  . Hypertension   . CAD 11/12/2007    a. s/p CABG;  b. Cath 7/11 Patent LIMA to the LAD. Previously known occlusion of a saphenous vein graft to diagonal and sequential to obtuse marginals. Severe diffuse native obtuse marginal and diagonal branch vessel disease. Preserved ejection fraction.;  c. 03/2013 Lexi CL: mild inflat and inf ischemia, EF 54%->initial med rx.  Marland Kitchen HYPERLIPIDEMIA 11/12/2007  . HYPERTENSION 11/12/2007  . PERIPHERAL VASCULAR DISEASE 11/12/2007  . ARTHRITIS 11/12/2007  . BENIGN PROSTATIC HYPERTROPHY, HX OF 11/12/2007  . Edema 10/17/2010  . INGUINAL HERNIAS, BILATERAL 11/10/2006  . Obesity, unspecified 07/05/2009  . SLEEP APNEA 11/12/2007  . History of PTCA 2005  . Osteoarthritis   . Carotid stenosis, bilateral   . Nephrolithiasis   . thymus ca dx'd 2003    Radiation comp 2003  . NHL (non-Hodgkin's lymphoma) dx'd 2008    Chemo comp 10/2010; rituxin comp 10/2010  . 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 MANTLE CELL LYMPHOMA INTRA-ABDOMINAL LYMPH NODES 11/12/2007    Past Surgical History  Procedure Laterality Date  . Coronary artery bypass graft    . Cardiac catheterization  08/2009, 02/2010    Family History  Problem Relation Age of Onset  . Coronary artery disease Mother     died at 109  . Heart attack Sister     died at age 64  . Heart attack Brother     Social History:  reports that he has never smoked. He has never used  smokeless tobacco. He reports that he does not drink alcohol or use illicit drugs.  Allergies:  Allergies  Allergen Reactions  . Atorvastatin     Incredible dizziness    Medications: I have reviewed the patient's current medications.  Results for orders placed during the hospital encounter of 12/09/13 (from the past 48 hour(s))  URINALYSIS, ROUTINE W REFLEX MICROSCOPIC     Status: None   Collection Time    12/09/13  1:09 AM      Result Value Ref Range   Color, Urine YELLOW  YELLOW   APPearance CLEAR  CLEAR   Specific Gravity, Urine 1.010  1.005 - 1.030   pH 7.0  5.0 - 8.0   Glucose, UA NEGATIVE  NEGATIVE mg/dL   Hgb urine dipstick NEGATIVE  NEGATIVE   Bilirubin Urine NEGATIVE  NEGATIVE   Ketones, ur NEGATIVE  NEGATIVE mg/dL   Protein, ur NEGATIVE  NEGATIVE mg/dL   Urobilinogen, UA 0.2  0.0 - 1.0 mg/dL   Nitrite NEGATIVE  NEGATIVE   Leukocytes, UA NEGATIVE  NEGATIVE   Comment: MICROSCOPIC NOT DONE ON URINES WITH NEGATIVE PROTEIN, BLOOD, LEUKOCYTES, NITRITE, OR GLUCOSE <1000 mg/dL.  CBC WITH DIFFERENTIAL     Status: Abnormal   Collection Time    12/09/13  1:30 AM      Result Value Ref Range   WBC 6.5  4.0 - 10.5 K/uL   RBC  3.63 (*) 4.22 - 5.81 MIL/uL   Hemoglobin 11.9 (*) 13.0 - 17.0 g/dL   HCT 36.0 (*) 39.0 - 52.0 %   MCV 99.2  78.0 - 100.0 fL   MCH 32.8  26.0 - 34.0 pg   MCHC 33.1  30.0 - 36.0 g/dL   RDW 13.6  11.5 - 15.5 %   Platelets 172  150 - 400 K/uL   Neutrophils Relative % 73  43 - 77 %   Neutro Abs 4.8  1.7 - 7.7 K/uL   Lymphocytes Relative 14  12 - 46 %   Lymphs Abs 0.9  0.7 - 4.0 K/uL   Monocytes Relative 10  3 - 12 %   Monocytes Absolute 0.6  0.1 - 1.0 K/uL   Eosinophils Relative 3  0 - 5 %   Eosinophils Absolute 0.2  0.0 - 0.7 K/uL   Basophils Relative 0  0 - 1 %   Basophils Absolute 0.0  0.0 - 0.1 K/uL  BASIC METABOLIC PANEL     Status: Abnormal   Collection Time    12/09/13  1:30 AM      Result Value Ref Range   Sodium 140  137 - 147 mEq/L    Potassium 3.8  3.7 - 5.3 mEq/L   Chloride 102  96 - 112 mEq/L   CO2 25  19 - 32 mEq/L   Glucose, Bld 135 (*) 70 - 99 mg/dL   BUN 24 (*) 6 - 23 mg/dL   Creatinine, Ser 1.33  0.50 - 1.35 mg/dL   Calcium 9.1  8.4 - 10.5 mg/dL   GFR calc non Af Amer 49 (*) >90 mL/min   GFR calc Af Amer 57 (*) >90 mL/min   Comment: (NOTE)     The eGFR has been calculated using the CKD EPI equation.     This calculation has not been validated in all clinical situations.     eGFR's persistently <90 mL/min signify possible Chronic Kidney     Disease.  SAMPLE TO BLOOD BANK     Status: None   Collection Time    12/09/13  1:30 AM      Result Value Ref Range   Blood Bank Specimen SAMPLE AVAILABLE FOR TESTING     Sample Expiration 12/12/2013    PROTIME-INR     Status: None   Collection Time    12/09/13  1:30 AM      Result Value Ref Range   Prothrombin Time 13.6  11.6 - 15.2 seconds   INR 1.06  0.00 - 1.49  PRO B NATRIURETIC PEPTIDE     Status: Abnormal   Collection Time    12/09/13  1:30 AM      Result Value Ref Range   Pro B Natriuretic peptide (BNP) 630.1 (*) 0 - 450 pg/mL    Dg Chest 1 View  12/09/2013   CLINICAL DATA:  Left hip pain  EXAM: CHEST - 1 VIEW  COMPARISON:  None.  FINDINGS: There is elevation of the right diaphragm. There is no focal parenchymal opacity, pleural effusion, or pneumothorax. The heart and mediastinal contours are unremarkable. Prior CABG. Right-sided Port-A-Cath in satisfactory position.  The osseous structures are unremarkable.  IMPRESSION: No active disease.   Electronically Signed   By: Kathreen Devoid   On: 12/09/2013 01:56   Dg Hip Complete Left  12/09/2013   CLINICAL DATA:  Fall  EXAM: LEFT HIP - COMPLETE 2+ VIEW  COMPARISON:  None.  FINDINGS: There is  a mildly displaced left femoral neck fracture. There is no dislocation. The right hip is unremarkable.  IMPRESSION: Mildly displaced left femoral neck fracture.   Electronically Signed   By: Kathreen Devoid   On: 12/09/2013 01:55   moderate left hip OA noted on XRAY  ROS Blood pressure 160/90, pulse 67, temperature 97.9 F (36.6 C), temperature source Oral, resp. rate 20, height _0  (1.753 m), weight 94.3 kg (207 lb 14.3 oz), SpO2 99.00%. Physical Exam Healthy appearing male in moderate distress laying in bed, neck nontender with normal AROM, T and L spine non tender and no deformity.  Bilateral UEs with normal AROM and normal strength, no pain.  Left LE : unable to move due to pain  Shortened and externally rotated.  Right LE with no pain with hip flexion and log roll. Bilateral LEs with edema consistent with poor circulation.  Able to wiggle toes on both feet.  Assessment/Plan: Left hip displaced femoral neck fracture in the setting of pre-existing OA of the hip after mechanical fall. I discussed the need for left hip hemiarthroplasty to restore patients mobility and the fact that given he was having pain in the hip prior to the injury, that performing a total hip replacement may be the better option at this time.  Will discuss with Dr Paralee Cancel one of my partners who is a hip and knee replacement specialist this morning. Medical clearance.  Augustin Schooling 12/09/2013, 5:34 AM

## 2013-12-10 ENCOUNTER — Encounter (HOSPITAL_COMMUNITY): Payer: Self-pay | Admitting: Orthopedic Surgery

## 2013-12-10 DIAGNOSIS — R9431 Abnormal electrocardiogram [ECG] [EKG]: Secondary | ICD-10-CM

## 2013-12-10 LAB — BLOOD GAS, ARTERIAL
Acid-Base Excess: 2.4 mmol/L — ABNORMAL HIGH (ref 0.0–2.0)
BICARBONATE: 27.4 meq/L — AB (ref 20.0–24.0)
Drawn by: 11249
O2 Content: 6 L/min
O2 Saturation: 94.3 %
PCO2 ART: 47.6 mmHg — AB (ref 35.0–45.0)
PO2 ART: 73.8 mmHg — AB (ref 80.0–100.0)
Patient temperature: 99
TCO2: 25.5 mmol/L (ref 0–100)
pH, Arterial: 7.38 (ref 7.350–7.450)

## 2013-12-10 LAB — BASIC METABOLIC PANEL
BUN: 17 mg/dL (ref 6–23)
CO2: 27 meq/L (ref 19–32)
CREATININE: 1.05 mg/dL (ref 0.50–1.35)
Calcium: 8.1 mg/dL — ABNORMAL LOW (ref 8.4–10.5)
Chloride: 102 mEq/L (ref 96–112)
GFR calc non Af Amer: 65 mL/min — ABNORMAL LOW (ref >90)
GFR, EST AFRICAN AMERICAN: 76 mL/min — AB (ref >90)
Glucose, Bld: 121 mg/dL — ABNORMAL HIGH (ref 70–99)
POTASSIUM: 4.4 meq/L (ref 3.7–5.3)
SODIUM: 138 meq/L (ref 137–147)

## 2013-12-10 LAB — CBC
HCT: 30.2 % — ABNORMAL LOW (ref 39.0–52.0)
Hemoglobin: 10.2 g/dL — ABNORMAL LOW (ref 13.0–17.0)
MCH: 33.3 pg (ref 26.0–34.0)
MCHC: 33.8 g/dL (ref 30.0–36.0)
MCV: 98.7 fL (ref 78.0–100.0)
Platelets: 135 10*3/uL — ABNORMAL LOW (ref 150–400)
RBC: 3.06 MIL/uL — AB (ref 4.22–5.81)
RDW: 13.6 % (ref 11.5–15.5)
WBC: 5.3 10*3/uL (ref 4.0–10.5)

## 2013-12-10 MED ORDER — METHOCARBAMOL 500 MG PO TABS: 500.0000 mg | ORAL_TABLET | Freq: Four times a day (QID) | ORAL | Status: AC | PRN

## 2013-12-10 MED ORDER — FERROUS SULFATE 325 (65 FE) MG PO TABS: 325.0000 mg | ORAL_TABLET | Freq: Three times a day (TID) | ORAL | Status: DC

## 2013-12-10 MED ORDER — HYDROCODONE-ACETAMINOPHEN 5-325 MG PO TABS: 1.0000 | ORAL_TABLET | Freq: Four times a day (QID) | ORAL | Status: DC | PRN

## 2013-12-10 MED ORDER — ENOXAPARIN SODIUM 40 MG/0.4ML ~~LOC~~ SOLN: 40.0000 mg | SUBCUTANEOUS | Status: DC

## 2013-12-10 MED ORDER — ACETAMINOPHEN 325 MG PO TABS: 650.0000 mg | ORAL_TABLET | Freq: Four times a day (QID) | ORAL | Status: DC | PRN

## 2013-12-10 MED ORDER — DSS 100 MG PO CAPS: 100.0000 mg | ORAL_CAPSULE | Freq: Two times a day (BID) | ORAL | Status: DC

## 2013-12-10 MED ORDER — ACETAMINOPHEN 650 MG RE SUPP: 650.0000 mg | Freq: Four times a day (QID) | RECTAL | Status: DC | PRN

## 2013-12-10 MED ORDER — POLYETHYLENE GLYCOL 3350 17 G PO PACK: 17.0000 g | PACK | Freq: Two times a day (BID) | ORAL | Status: DC

## 2013-12-10 NOTE — Progress Notes (Addendum)
   Subjective: 1 Day Post-Op Procedure(s) (LRB): TOTAL HIP ARTHROPLASTY (Left)   Patient reports pain as mild, pain controlled. No events throughout the night. Discussed the surgery and WB status. He states that he hasn't tested the leg yet, but looking forward to it.  States that if he does well with PT he would look forward to going home, if not he would have to consider a SNF.  Objective:   VITALS:   Filed Vitals:   12/10/13 0451  BP: 110/62  Pulse: 76  Temp: 97.2 F (36.2 C)  Resp: 20    Neurovascular intact Dorsiflexion/Plantar flexion intact Incision: dressing C/D/I No cellulitis present Compartment soft  LABS  Recent Labs  12/09/13 0130 12/10/13 0615  HGB 11.9* 10.2*  HCT 36.0* 30.2*  WBC 6.5 5.3  PLT 172 135*     Recent Labs  12/09/13 0130 12/10/13 0615  NA 140 138  K 3.8 4.4  BUN 24* 17  CREATININE 1.33 1.05  GLUCOSE 135* 121*     Assessment/Plan: 1 Day Post-Op Procedure(s) (LRB): TOTAL HIP ARTHROPLASTY (Left) Foley cath d/c'ed Advance diet Up with therapy Discharge home with home health/SNF, depending on how he does with PT.  Ortho recommendations: Lovenox for 12 days for anticoagulation (Rx written), then can return to Plavix and ASA. Hydrocodone for pain management (Rx written). Robaxin for muscle spasms (Rx written). MiraLax and Colace for constipation Iron 325 mg tid for 2-3 weeks  WBAT on the left leg Dressing to remain in place until follow in clinic in 2 weeks. Dressing is waterproof and may shower with it in place. Follow up in 2 weeks at [facility]. Follow up with O[facility],Kathaleen Dudziak D in 2 weeks.  Contact information:  [facility] [address] [place] Pattillo   PAC  12/10/2013, 8:12 AM

## 2013-12-10 NOTE — Progress Notes (Signed)
OT Cancellation Note  Patient Details Name: Martin Morris MRN: [id number] DOB: [birth year]   Cancelled Treatment:    Reason Eval/Treat Not Completed: Fatigue/lethargy limiting ability to participate. Spoke with pt, wife and daughter regarding role of OT. Pt fatigued s/p PT. Will check on this pt next day for OT eval.  Wife stated she had hoped to go home from hospital but understands pt may need SNF. Wife mentioned [facility] in [place] which is near their home.  Betsy Pries 12/10/2013, 12:23 PM

## 2013-12-10 NOTE — Evaluation (Signed)
Physical Therapy Evaluation Patient Details Name: KAVAN DEVAN MRN: [id number] DOB: 01-20-1933 Today's Date: 12/10/2013   History of Present Illness  80 yo male s/p L THA after sustaining femoral neck fracture. hx of HTN, PVD, arthritis, BPH, NHL  Clinical Impression  At start of session pt c/o leg feeling "like its twisted." Found pt's L leg to be significantly internally rotated and pt was slightly turned onto R side. Removed pillow and repositioned L leg. Educated pt and wife on generic posterior hip precautions. Also updated pt's white board with hip precautions and WB status. On eval, pt required Max assist to get to EOB and to stand statically with walker. Unable to safely attempt pivot or ambulation with 1 person assist. Pt able to tolerate WBing on L LE but unable to shift or move L LE.     Follow Up Recommendations SNF    Equipment Recommendations  Rolling walker with 5" wheels    Recommendations for Other Services       Precautions / Restrictions Precautions Precautions: Fall;Posterior Hip Precaution Comments: Educated pt on general posterior hip precautions (OP note not available at time of eval and orders did not state) Restrictions Weight Bearing Restrictions: Yes LLE Weight Bearing: Weight bearing as tolerated      Mobility  Bed Mobility Overal bed mobility: Needs Assistance Bed Mobility: Supine to Sit;Sit to Supine     Supine to sit: Max assist;HOB elevated Sit to supine: Mod assist;+2 for physical assistance;+2 for safety/equipment   General bed mobility comments: Pt used trapeze bar to scoot. Assist for L LE and therapist provided handheld assist to get trunk to upright. Utilized bedpad for scooting, positioning.   Transfers Overall transfer level: Needs assistance Equipment used: Rolling walker (2 wheeled) Transfers: Sit to/from Stand Sit to Stand: Max assist;From elevated surface         General transfer comment: Highly elevated surface. Assist  to rise, stabilize, control descent. did not feel it was safe to attempt stand pivot with +1 assist.   Ambulation/Gait             General Gait Details: Attempted lateral steps with walker at EOB but pt was unable to shift weight and/or move L LE.   Stairs            Wheelchair Mobility    Modified Rankin (Stroke Patients Only)       Balance Overall balance assessment: Needs assistance;History of Falls Sitting-balance support: Bilateral upper extremity supported;Feet supported Sitting balance-Leahy Scale: Good Sitting balance - Comments: Pt was able to scoot to [facility].    Standing balance support: Bilateral upper extremity supported Standing balance-Leahy Scale: Poor                               Pertinent Vitals/Pain L hip Moderate pain at start of session and during activity    Home Living Family/patient expects to be discharged to:: Skilled nursing facility Living Arrangements: Spouse/significant other                    Prior Function                 Hand Dominance        Extremity/Trunk Assessment   Upper Extremity Assessment: Generalized weakness           Lower Extremity Assessment: LLE deficits/detail   LLE Deficits / Details: moves ankle okay.   Cervical /  Trunk Assessment: Kyphotic  Communication   Communication: No difficulties  Cognition Arousal/Alertness: Awake/alert Behavior During Therapy: WFL for tasks assessed/performed Overall Cognitive Status: Within Functional Limits for tasks assessed                      General Comments      Exercises        Assessment/Plan    PT Assessment Patient needs continued PT services  PT Diagnosis Difficulty walking;Abnormality of gait;Generalized weakness;Acute pain   PT Problem List Decreased strength;Decreased range of motion;Decreased activity tolerance;Decreased balance;Decreased mobility;Obesity;Decreased knowledge of use of DME;Decreased knowledge  of precautions;Pain  PT Treatment Interventions DME instruction;Gait training;Functional mobility training;Therapeutic activities;Therapeutic exercise;Patient/family education;Balance training   PT Goals (Current goals can be found in the Care Plan section) Acute Rehab PT Goals Patient Stated Goal: to regain independence PT Goal Formulation: With patient Time For Goal Achievement: 12/24/13 Potential to Achieve Goals: Good    Frequency Min 3X/week   Barriers to discharge        Co-evaluation               End of Session Equipment Utilized During Treatment: Gait belt Activity Tolerance: Patient limited by fatigue;Patient limited by pain Patient left: in bed;with call bell/phone within reach (with pillow under L leg and between knees) Nurse Communication: Weight bearing status;Precautions (written on white board in pt's room)         Time: 0981-1914 PT Time Calculation (min): 36 min   Charges:   PT Evaluation $Initial PT Evaluation Tier I: 1 Procedure PT Treatments $Therapeutic Activity: 23-37 mins   PT G Codes:          Weston Anna, MPT Pager: [phone]

## 2013-12-10 NOTE — Progress Notes (Addendum)
[facility] PROGRESS NOTE  Martin Morris LOV:[id number] DOB: [birth year] DOA: 12/09/2013 PCP: [name], MD  Assessment/Plan: Principal Problem:   Fracture of femoral neck, left: Status post hip arthroplasty done 4/16. Pain relatively well controlled. Plan is for skilled nursing facility, likely Monday Active Problems:   MANTLE CELL LYMPHOMA INTRA-ABDOMINAL LYMPH NODES: Missing this round of chemotherapy    HYPERLIPIDEMIA: Stable   HYPERTENSION: Stable. Blood pressure elevated likely secondary to pain   CAD: Stable   SLEEP APNEA : Monitor. Continue nightly CPAP  Prolonged QT: Noted on admission. Repeat EKG in the morning. Continue on telemetry.  Obesity: Patient meets criteria with BMI greater than 30  Code Status:  Full code Family Communication: Plan discussed with patient's wife at the bedside  Disposition Plan: Skilled nursing facility likely Monday   Consultants:  [facility]lin-orthopedic surgery  Procedures:  Status post hip arthroplasty done 4/16  Antibiotics:  None  HPI/Subjective: Patient doing very well today. Pain well controlled. He is in good spirits.  Objective: Filed Vitals:   12/10/13 1600  BP:   Pulse:   Temp:   Resp: 20    Intake/Output Summary (Last 24 hours) at 12/10/13 1624 Last data filed at 12/10/13 0758  Gross per 24 hour  Intake 1317.5 ml  Output    845 ml  Net  472.5 ml   Filed Weights   12/09/13 0408  Weight: 94.3 kg (207 lb 14.3 oz)    Exam:   General:  Alert and oriented x3, no acute distress  Cardiovascular: Regular rate and rhythm, S1-S2  Respiratory: Clear to auscultation bilaterally  Abdomen: Soft, nontender, nondistended, positive bowel sounds  Musculoskeletal: No clubbing or cyanosis, or edema   Data Reviewed: Basic Metabolic Panel:  Recent Labs Lab 12/09/13 0130 12/10/13 0615  NA 140 138  K 3.8 4.4  CL 102 102  CO2 25 27  GLUCOSE 135* 121*  BUN 24* 17  CREATININE 1.33 1.05  CALCIUM  9.1 8.1*   Liver Function Tests: No results found for this basename: AST, ALT, ALKPHOS, BILITOT, PROT, ALBUMIN,  in the last 168 hours No results found for this basename: LIPASE, AMYLASE,  in the last 168 hours No results found for this basename: AMMONIA,  in the last 168 hours CBC:  Recent Labs Lab 12/09/13 0130 12/10/13 0615  WBC 6.5 5.3  NEUTROABS 4.8  --   HGB 11.9* 10.2*  HCT 36.0* 30.2*  MCV 99.2 98.7  PLT 172 135*   Cardiac Enzymes: No results found for this basename: CKTOTAL, CKMB, CKMBINDEX, TROPONINI,  in the last 168 hours BNP (last 3 results)  Recent Labs  12/09/13 0130  PROBNP 630.1*   CBG: No results found for this basename: GLUCAP,  in the last 168 hours  Recent Results (from the past 240 hour(s))  SURGICAL PCR SCREEN     Status: None   Collection Time    12/09/13  2:40 PM      Result Value Ref Range Status   MRSA, PCR NEGATIVE  NEGATIVE Final   Staphylococcus aureus NEGATIVE  NEGATIVE Final   Comment:            The Xpert SA Assay (FDA     approved for NASAL specimens     in patients over 16 years of age),     is one component of     a comprehensive surveillance     program.  Test performance has     been validated by [employer]  [facility] for patients greater     than or equal to 60 year old.     It is not intended     to diagnose infection nor to     guide or monitor treatment.     Studies: Dg Chest 1 View  12/09/2013   CLINICAL DATA:  Left hip pain  EXAM: CHEST - 1 VIEW  COMPARISON:  None.  FINDINGS: There is elevation of the right diaphragm. There is no focal parenchymal opacity, pleural effusion, or pneumothorax. The heart and mediastinal contours are unremarkable. Prior CABG. Right-sided Port-A-Cath in satisfactory position.  The osseous structures are unremarkable.  IMPRESSION: No active disease.   Electronically Signed   By: Kathreen Devoid   On: 12/09/2013 01:56   Dg Hip Complete Left  12/09/2013   CLINICAL DATA:  Fall  EXAM: LEFT HIP -  COMPLETE 2+ VIEW  COMPARISON:  None.  FINDINGS: There is a mildly displaced left femoral neck fracture. There is no dislocation. The right hip is unremarkable.  IMPRESSION: Mildly displaced left femoral neck fracture.   Electronically Signed   By: Kathreen Devoid   On: 12/09/2013 01:55   Dg Pelvis Portable  12/09/2013   CLINICAL DATA:  Postoperative left hip replacement  EXAM: PORTABLE PELVIS 1-2 VIEWS  COMPARISON:  None.  FINDINGS: The patient has undergone left total hip joint prosthesis placement. Radiographic positioning of the prosthetic components is good. There is gas within the soft tissues.  IMPRESSION: The patient has undergone left total hip joint prosthesis placement without evidence of immediate postprocedure complication.   Electronically Signed   By: David  Martinique   On: 12/09/2013 20:56    Scheduled Meds: . acyclovir  400 mg Oral BID  . amLODipine  5 mg Oral Daily  . aspirin EC  325 mg Oral Daily  . atenolol  50 mg Oral Daily  . citalopram  20 mg Oral Daily  . docusate sodium  100 mg Oral BID  . enoxaparin (LOVENOX) injection  40 mg Subcutaneous Q24H  . ferrous sulfate  325 mg Oral TID PC  . polyethylene glycol  17 g Oral BID  . pravastatin  80 mg Oral q1800  . sulfamethoxazole-trimethoprim  1 tablet Oral Q M,W,F   Continuous Infusions: . sodium chloride 0.9 % 1,000 mL with potassium chloride 10 mEq infusion 50 mL/hr at 12/09/13 2339    Principal Problem:   Fracture of femoral neck, left Active Problems:   MANTLE CELL LYMPHOMA INTRA-ABDOMINAL LYMPH NODES   HYPERLIPIDEMIA   HYPERTENSION   CAD   SLEEP APNEA    Time spent: 20 minutes    [place] [facility] Pager [phone]. If 7PM-7AM, please contact night-coverage at [email or web address], password [facility] 12/10/2013, 4:24 PM  LOS: 1 day

## 2013-12-11 LAB — BASIC METABOLIC PANEL
BUN: 15 mg/dL (ref 6–23)
CO2: 29 meq/L (ref 19–32)
Calcium: 7.9 mg/dL — ABNORMAL LOW (ref 8.4–10.5)
Chloride: 102 mEq/L (ref 96–112)
Creatinine, Ser: 1.03 mg/dL (ref 0.50–1.35)
GFR calc Af Amer: 78 mL/min — ABNORMAL LOW (ref >90)
GFR calc non Af Amer: 67 mL/min — ABNORMAL LOW (ref >90)
Glucose, Bld: 117 mg/dL — ABNORMAL HIGH (ref 70–99)
POTASSIUM: 4.2 meq/L (ref 3.7–5.3)
SODIUM: 141 meq/L (ref 137–147)

## 2013-12-11 LAB — CBC
HCT: 29.2 % — ABNORMAL LOW (ref 39.0–52.0)
HEMOGLOBIN: 9.8 g/dL — AB (ref 13.0–17.0)
MCH: 33.3 pg (ref 26.0–34.0)
MCHC: 33.6 g/dL (ref 30.0–36.0)
MCV: 99.3 fL (ref 78.0–100.0)
Platelets: 129 10*3/uL — ABNORMAL LOW (ref 150–400)
RBC: 2.94 MIL/uL — AB (ref 4.22–5.81)
RDW: 13.6 % (ref 11.5–15.5)
WBC: 6.2 10*3/uL (ref 4.0–10.5)

## 2013-12-11 MED ORDER — SODIUM CHLORIDE 0.9 % IJ SOLN
10.0000 mL | INTRAMUSCULAR | Status: DC | PRN
Start: 2013-12-11 — End: 2013-12-13
  Administered 2013-12-12 – 2013-12-13 (×3): 10 mL

## 2013-12-11 NOTE — Progress Notes (Signed)
[facility] PROGRESS NOTE  HUSAM HOHN BHA:[id number] DOB: Oct 21, 1932 DOA: 12/09/2013 PCP: [name], MD Brief narrative: Patient is a 80 year old male with history of mechanical fall while walking up a set of 3 stairs. He reports he lost his balance fell backward and landed on his left hip. X-ray in the emergency department reported mildly displaced left femoral neck fracture.  Assessment/Plan: Principal Problem:  Fracture of femoral neck, left:  - Status post hip arthroplasty done 4/16. Pain relatively well controlled. Plan is for skilled nursing facility, likely Monday  - Will need continued PT and pain control  Active Problems:  MANTLE CELL LYMPHOMA INTRA-ABDOMINAL LYMPH NODES:  - Missing this round of chemotherapy, we'll recommend he followup with his  HYPERLIPIDEMIA: Stable on statin currently  HYPERTENSION:  Stable. Blood pressure elevated likely secondary to pain . Motor otherwise relatively well controlled  CAD: Stable, no chest pain continue statin  SLEEP APNEA : Monitor. Continue nightly CPAP   Hypoxia: No reported history of COPD, we'll obtain echocardiogram to assess for pulmonary artery hypertension. -Patient will need pulmonary function testing as outpatient - Continue supplemental oxygen  Prolonged QT: Repeat EKG did not report prolonged QT, has resolved   Obesity: Patient meets criteria with BMI greater than 30  Code Status: Full Family Communication: Discussed with spouse at bedside Disposition Plan: Pending physical therapy recommendations most likely SNF on Monday   Consultants: [facility]lin-orthopedic surgery  Procedures: Status post hip arthroplasty done 4/16  Antibiotics:  Patient to continue acyclovir and Bactrim  HPI/Subjective: No new complaints, no acute issues  Objective: Filed Vitals:   12/11/13 1057  BP: 128/57  Pulse: 84  Temp:   Resp:     Intake/Output Summary (Last 24 hours) at 12/11/13 1241 Last data filed  at 12/11/13 0807  Gross per 24 hour  Intake   1383 ml  Output    700 ml  Net    683 ml   Filed Weights   12/09/13 0408  Weight: 94.3 kg (207 lb 14.3 oz)    Exam:   General:  Patient in no acute distress, alert and awake  Cardiovascular: Regular rate and rhythm no murmurs or rubs  Respiratory: Nasal cannula in place, no increased work of breathing, breath sounds bilaterally, no wheezes  Abdomen: Soft, nontender, nondistended  Musculoskeletal: No clubbing, guaze in place over left hip, no active bleeding   Data Reviewed: Basic Metabolic Panel:  Recent Labs Lab 12/09/13 0130 12/10/13 0615 12/11/13 0524  NA 140 138 141  K 3.8 4.4 4.2  CL 102 102 102  CO2 25 27 29   GLUCOSE 135* 121* 117*  BUN 24* 17 15  CREATININE 1.33 1.05 1.03  CALCIUM 9.1 8.1* 7.9*   Liver Function Tests: No results found for this basename: AST, ALT, ALKPHOS, BILITOT, PROT, ALBUMIN,  in the last 168 hours No results found for this basename: LIPASE, AMYLASE,  in the last 168 hours No results found for this basename: AMMONIA,  in the last 168 hours CBC:  Recent Labs Lab 12/09/13 0130 12/10/13 0615 12/11/13 0524  WBC 6.5 5.3 6.2  NEUTROABS 4.8  --   --   HGB 11.9* 10.2* 9.8*  HCT 36.0* 30.2* 29.2*  MCV 99.2 98.7 99.3  PLT 172 135* 129*   Cardiac Enzymes: No results found for this basename: CKTOTAL, CKMB, CKMBINDEX, TROPONINI,  in the last 168 hours BNP (last 3 results)  Recent Labs  12/09/13 0130  PROBNP 630.1*   CBG: No results found for this  basename: GLUCAP,  in the last 168 hours  Recent Results (from the past 240 hour(s))  SURGICAL PCR SCREEN     Status: None   Collection Time    12/09/13  2:40 PM      Result Value Ref Range Status   MRSA, PCR NEGATIVE  NEGATIVE Final   Staphylococcus aureus NEGATIVE  NEGATIVE Final   Comment:            The Xpert SA Assay (FDA     approved for NASAL specimens     in patients over 21 years of age),     is one component of     a  comprehensive surveillance     program.  Test performance has     been validated by [employer] for patients greater     than or equal to 26 year old.     It is not intended     to diagnose infection nor to     guide or monitor treatment.     Studies: Dg Pelvis Portable  12/09/2013   CLINICAL DATA:  Postoperative left hip replacement  EXAM: PORTABLE PELVIS 1-2 VIEWS  COMPARISON:  None.  FINDINGS: The patient has undergone left total hip joint prosthesis placement. Radiographic positioning of the prosthetic components is good. There is gas within the soft tissues.  IMPRESSION: The patient has undergone left total hip joint prosthesis placement without evidence of immediate postprocedure complication.   Electronically Signed   By: David  Martinique   On: 12/09/2013 20:56    Scheduled Meds: . acyclovir  400 mg Oral BID  . amLODipine  5 mg Oral Daily  . aspirin EC  325 mg Oral Daily  . atenolol  50 mg Oral Daily  . citalopram  20 mg Oral Daily  . docusate sodium  100 mg Oral BID  . enoxaparin (LOVENOX) injection  40 mg Subcutaneous Q24H  . ferrous sulfate  325 mg Oral TID PC  . polyethylene glycol  17 g Oral BID  . pravastatin  80 mg Oral q1800  . sulfamethoxazole-trimethoprim  1 tablet Oral Q M,W,F   Continuous Infusions: . sodium chloride 0.9 % 1,000 mL with potassium chloride 10 mEq infusion 50 mL/hr at 12/10/13 2256    Principal Problem:   Fracture of femoral neck, left Active Problems:   MANTLE CELL LYMPHOMA INTRA-ABDOMINAL LYMPH NODES   HYPERLIPIDEMIA   HYPERTENSION   CAD   SLEEP APNEA   Obesity (BMI 30-39.9)   Acute blood loss anemia   Prolonged Q-T interval on ECG    Time spent: > 35 minutes    [place] [facility] Pager [phone]. If 7PM-7AM, please contact night-coverage at [email or web address], password [facility] 12/11/2013, 12:41 PM  LOS: 2 days

## 2013-12-11 NOTE — Progress Notes (Signed)
Pt lethargic Martin Morris. O2 sat at 6L/min via n/c 90-92%. Weaned O2 to 5 L/min, however O2 sat dropped slowly to 81%. Wife very concerned about pt sleeping almost all day up until now. Lung sound diminished on auscultation. On call NP paged and ordered ABG. Result in and NP made aware-no orders received. Will monitor pt closely.  12/10/13 2238 12/10/13 2259 12/10/13 2327  Vitals  Temp 99 F (37.2 C) --  --   Temp src Oral --  --   BP ! 113/55 mmHg --  --   BP Location Left arm --  --   BP Method Automatic --  --   Pulse Rate 80 --  --   Oxygen Therapy  SpO2 93 % ! 81 % 92 %  O2 Device Nasal cannula Nasal cannula Nasal cannula  O2 Flow Rate (L/min) 6 L/min 5 L/min --

## 2013-12-11 NOTE — Progress Notes (Signed)
Physical Therapy Treatment Patient Details Name: Martin Morris MRN: [id number] DOB: 10-10-1932 Today's Date: 12/11/2013    History of Present Illness      PT Comments    Pt pleasant and cooperative but fatigues easily and requires increased time and +2 assist for all mobility tasks  Follow Up Recommendations  SNF     Equipment Recommendations  Rolling walker with 5" wheels    Recommendations for Other Services       Precautions / Restrictions Precautions Precautions: Fall;Posterior Hip Precaution Booklet Issued: Yes (comment) Precaution Comments: Sign hung on wall - all THP reviewed with pt and family Restrictions Weight Bearing Restrictions: No LLE Weight Bearing: Weight bearing as tolerated    Mobility  Bed Mobility Overal bed mobility: Needs Assistance Bed Mobility: Supine to Sit     Supine to sit: +2 for physical assistance;Mod assist     General bed mobility comments: Cues for use of R LE to assist and sequencing,  Physical assist to shift to EOB using drop pad and to bring trunk to upright  Transfers Overall transfer level: Needs assistance Equipment used: Rolling walker (2 wheeled) Transfers: Sit to/from Stand Sit to Stand: +2 physical assistance;Mod assist         General transfer comment: Cues for LE management and use of UEs to self assist  Ambulation/Gait Ambulation/Gait assistance: Mod assist;+2 physical assistance Ambulation Distance (Feet): 4 Feet Assistive device: Rolling walker (2 wheeled) Gait Pattern/deviations: Step-to pattern;Decreased step length - right;Decreased step length - left;Shuffle;Trunk flexed Gait velocity: decr   General Gait Details: Cues for posture, sequence, stride length, increased UE WB and position from RW.  Physical assist for support, to shift wt, maintain balance, manage RW    Stairs            Wheelchair Mobility    Modified Rankin (Stroke Patients Only)       Balance                                    Cognition Arousal/Alertness: Awake/alert Behavior During Therapy: WFL for tasks assessed/performed Overall Cognitive Status: Within Functional Limits for tasks assessed                      Exercises Total Joint Exercises Ankle Circles/Pumps: AROM;15 reps;AAROM;Supine;Both Quad Sets: AROM;AAROM;Both;15 reps;Supine Heel Slides: AAROM;20 reps;Supine;Left Hip ABduction/ADduction: AAROM;Left;15 reps;Supine    General Comments        Pertinent Vitals/Pain Min c/o pain    Home Living                      Prior Function            PT Goals (current goals can now be found in the care plan section) Acute Rehab PT Goals Patient Stated Goal: to regain independence PT Goal Formulation: With patient Time For Goal Achievement: 12/24/13 Potential to Achieve Goals: Good Progress towards PT goals: Progressing toward goals    Frequency  Min 3X/week    PT Plan Current plan remains appropriate    Co-evaluation             End of Session Equipment Utilized During Treatment: Gait belt Activity Tolerance: Patient limited by fatigue Patient left: in chair;with call bell/phone within reach;with family/visitor present     Time: 4854-6270 PT Time Calculation (min): 35 min  Charges:  $Gait Training: 8-22 mins $Therapeutic Exercise:  8-22 mins                    G Codes:      [name] Dec 20, 2013, 5:21 PM

## 2013-12-11 NOTE — Progress Notes (Signed)
Physical Therapy Treatment Patient Details Name: Martin Morris MRN: [id number] DOB: 12/10/1932 Today's Date: 12/11/2013    History of Present Illness      PT Comments    Pt pleasant and cooperative but fatigues easily and requires assist of 2 and increased time for all mobility tasks  Follow Up Recommendations  SNF     Equipment Recommendations  Rolling walker with 5" wheels    Recommendations for Other Services       Precautions / Restrictions Precautions Precautions: Fall;Posterior Hip Precaution Booklet Issued: Yes (comment) Precaution Comments: Pt unable to recall any THP - all reviewed Restrictions Weight Bearing Restrictions: No LLE Weight Bearing: Weight bearing as tolerated    Mobility  Bed Mobility Overal bed mobility: Needs Assistance Bed Mobility: Sit to Supine     Supine to sit: +2 for physical assistance;Mod assist Sit to supine: Mod assist;+2 for physical assistance   General bed mobility comments: Cues for use of R LE to assist and sequencing,  Physical assist to shift to center of bed using drop pad and to control trunk descent  Transfers Overall transfer level: Needs assistance Equipment used: Rolling walker (2 wheeled) Transfers: Sit to/from Stand Sit to Stand: +2 physical assistance;Mod assist         General transfer comment: Cues for LE management and use of UEs to self assist  Ambulation/Gait Ambulation/Gait assistance: Mod assist;+2 physical assistance Ambulation Distance (Feet): 3 Feet Assistive device: Rolling walker (2 wheeled) Gait Pattern/deviations: Step-to pattern;Decreased step length - right;Decreased step length - left;Shuffle;Trunk flexed Gait velocity: decr   General Gait Details: Cues for posture, sequence, stride length, increased UE WB and position from RW.  Physical assist for support, to shift wt, maintain balance, manage RW    Stairs            Wheelchair Mobility    Modified Rankin (Stroke Patients  Only)       Balance                                    Cognition Arousal/Alertness: Awake/alert Behavior During Therapy: WFL for tasks assessed/performed Overall Cognitive Status: Within Functional Limits for tasks assessed                      Exercises Total Joint Exercises Ankle Circles/Pumps: AROM;15 reps;AAROM;Supine;Both Quad Sets: AROM;AAROM;Both;15 reps;Supine Heel Slides: AAROM;20 reps;Supine;Left Hip ABduction/ADduction: AAROM;Left;15 reps;Supine    General Comments        Pertinent Vitals/Pain No specific c/o pain    Home Living                      Prior Function            PT Goals (current goals can now be found in the care plan section) Acute Rehab PT Goals Patient Stated Goal: to regain independence PT Goal Formulation: With patient Time For Goal Achievement: 12/24/13 Potential to Achieve Goals: Good Progress towards PT goals: Progressing toward goals    Frequency  Min 3X/week    PT Plan Current plan remains appropriate    Co-evaluation             End of Session Equipment Utilized During Treatment: Gait belt Activity Tolerance: Patient limited by fatigue Patient left: in bed;with call bell/phone within reach;with family/visitor present     Time: 3810-1751 PT Time Calculation (min): 18 min  Charges:  $  Gait Training: 8-22 mins $Therapeutic Exercise: 8-22 mins                    G Codes:      Martin Morris Jan 05, 2014, 5:26 PM

## 2013-12-12 DIAGNOSIS — I369 Nonrheumatic tricuspid valve disorder, unspecified: Secondary | ICD-10-CM

## 2013-12-12 LAB — CBC
HCT: 27.3 % — ABNORMAL LOW (ref 39.0–52.0)
HEMOGLOBIN: 8.9 g/dL — AB (ref 13.0–17.0)
MCH: 32.5 pg (ref 26.0–34.0)
MCHC: 32.6 g/dL (ref 30.0–36.0)
MCV: 99.6 fL (ref 78.0–100.0)
Platelets: 124 10*3/uL — ABNORMAL LOW (ref 150–400)
RBC: 2.74 MIL/uL — AB (ref 4.22–5.81)
RDW: 13.6 % (ref 11.5–15.5)
WBC: 6.4 10*3/uL (ref 4.0–10.5)

## 2013-12-12 NOTE — Op Note (Signed)
NAME:Martin Morris  MEDICAL RECORD AL.:[id number]   LOCATION: [facility] FACILITY: [facility] OF BIRTH: 04-Dec-1932  PHYSICIAN: Pietro Cassis. Alvan Dame, M.D.    DATE OF PROCEDURE: 12/12/2013     OPERATIVE REPORT:   PREOPERATIVE DIAGNOSIS: Left displaced femoral neck fracture.   POSTOPERATIVE DIAGNOSIS: Left displaced femoral neck fracture with pre-operative hip pain, dysfunction.   PROCEDURE: left total hip replacement utilizing DePuy components size  39mm Pinnacle cup, 36+4 neutral Altrx liner, a size 5 Hi Tri-Lock stem  with a 36+5 Articuleze metal head ball.   SURGEON: Pietro Cassis. Alvan Dame, M.D.   ASSISTANT: Arlee Muslim , PA-C.  Please note that physician assistant, Arlee Muslim, was present for the entire case from  preoperative positioning to perioperative retractive management, general  facilitation and management of the operative extremity during the case  as well as general facilitation of the case and primary wound closure.   ANESTHESIA: General   SPECIMENS: None.   COMPLICATIONS: None.   ESTIMATED BLOOD LOSS: <150cc.   DRAINS: None  INDICATION FOR PROCEDURE: The patient is a 80 y.o. male patient who while celebrating his birthday stumbled on some stairs resulting in a fall onto his left side.  He immediate onset of pain, inability to bear weight.  He was brought to the emergency room where X-rays revealed a displaced femoral neck fracture.  Upon interviewing he and family he reports having left hip pain in the anterior aspect of his hip for a several months.  Given the acute findings as well as his prior complaints we discussed treatment options of hip hemiarthroplasty versus total hip replacement.  After reviewing risks and benefits, pros and cons we decided that total hip replacement would address the current acute issue as well as any previous issues.  After reviewing the  risks infection, DVT, dislocation, and need for revision surgeries, consent was  obtained for benefit of  pain relief.   PROCEDURE IN DETAIL: The patient was brought to the operative theater.  Once adequate anesthesia, preop antibiotics, 2gm of Ancef administered, the  patient was positioned in the right lateral decubitus position with the  left side up using the peg board positioner.  The left lower extremity was prepped and draped in  sterile fashion.  Time-out was performed identifying the patient,  planned procedure, and extremity.   A lateral incision was made based off the proximal trochanter. Sharp dissection was carried down to the level of the iliotibial band and gluteal fascia.  The iliotibial band and gluteal fascia were then opened through a  posterior approach in the hip.  The posterior aspect of the hip was exposed. The short external  rotators were taken down separate from the posterior capsule which was  preserved to help protect the sciatic nerve from retractors, and also to be potentially repaired at the end of the procedure. Following this, an L capsulotomy was made referenced off the superior femoral neck.  The hip was then dislocated. A femoral neck osteotomy was made based on anatomic landmarks and pre-operative templating.  The femoral head was removed and advanced degenerative changes were noted on both the femoral head and acetabulum.   Femoral preparation was begun, retractors were placed and the proximal  femur was opened with the starting drill, I then hand reamed once and irrigated to  try to prevent fat emboli.  I began broaching with 0 broach and broached up initially to a size 5  broach. I packed off the femur with a sponge and  then directed my attention to the acetabulum.  Acetabular retractors were placed. The labrum and foveal tissue was debrided. In  addition, there was a portion of the superior capsule removed to allow  for exposure of the cup.  I then began reaming with a 10mm reamer and reamed up  to a 47mm reamer with excellent bony bed preparation.  A  46mm Pinnacle cup was  chosen. The cup was subsequently impacted with good initial  scratch fit fixation.  1 cancellous screw was placed.  The hole eliminator was placed and the final 36+4 neutral liner was impacted  with good visualized rim fit.   Please note that I did assess the cup position using the anatomic  landmarks present including acetabular rim as well as my hip guide  indicating position about 20 degrees of forward flexion and 35-40  degrees of abduction.   At this point, I turned to the femur. The size 5 broach was placed in the femur.  Based on the pre-operative templating, I chose a high offset  neck. A trial reduction was now carried out with a high offset  neck and 36 head first with the +1.5 then the +5 head ball trial. On inspection of range of motion, there was no  evidence of impingement or subluxation. Leg lengths appeared to be  equal to the contralateral leg as I compare them to the preoperative  state. Given all these findings, the trial components were removed.  The final 5 Hi Tri-Lock stem was chosen. After irrigating the femoral  canal, the final stem was impacted and sat at the level of the final broach. Based on this and the trial reduction, a 36+5 Articuleze metal ball was chosen, impacted onto a clean dry trunnion.   At this point, the hip was reduced. The hip had been irrigated  throughout the case and again at this point. We reapproximated the capsule to itself using a #1 vicryl. The iliotibial band and gluteal  fascia were then reapproximated using #1 Vicryl. The remaining wound  was closed with 2-0 Vicryl and running 4-0 Monocryl.   The hip was cleaned, dried, and dressed sterilely using Dermabond and Aquacel  dressing. The patient was  brought to the recovery room in stable condition, tolerating the  procedure well.     Pietro Cassis Alvan Dame, M.D.

## 2013-12-12 NOTE — Progress Notes (Signed)
Clinical Social Work Department BRIEF PSYCHOSOCIAL ASSESSMENT 12/12/2013  Patient:  Martin Morris, Martin Morris     Account Number:  [id number]     Admit date:  12/09/2013  Clinical Social Worker:  Levie Heritage  Date/Time:  12/12/2013 12:33 PM  Referred by:  Physician  Date Referred:  12/12/2013 Referred for  SNF Placement   Other Referral:   Interview type:  Patient Other interview type:   Pt's wife at bedside    PSYCHOSOCIAL DATA Living Status:  WIFE Admitted from facility:   Level of care:   Primary support name:  Olin Hauser Primary support relationship to patient:  SPOUSE Degree of support available:   strong    CURRENT CONCERNS Current Concerns  Post-Acute Placement   Other Concerns:    SOCIAL WORK ASSESSMENT / PLAN Met with Pt and wife to discuss d/c plans.    Pt was resting quietly and would open his eyes periodically.    Pt's wife stated that both she and Pt are in agreement with SNF.  Pt's wife stated that they live close to [facility] and that they've heard good things about that facility.  To that end, they would like [facility] for Pt's rehab.    CSW provided Mrs. Chrissie Noa with a SNF list and discussed the SNF process.  CSW answered SNF questions, as well as questions regarding insurance and long-term care benefits.    Pt and wife agreed to a county-wide SNF search.    CSW thanked Pt and his wife for their time.   Assessment/plan status:  Psychosocial Support/Ongoing Assessment of Needs Other assessment/ plan:   Information/referral to community resources:   SNF list    PATIENT'S/FAMILY'S RESPONSE TO PLAN OF CARE: Pt and wife were calm, cooperative and pleasant.    Pt's wife stated that the family is coping well with Pt's current medical situation and that they are eager to begin rehab, hopefully at [place], so that Pt can return home.  Bernita Raisin, [place] Work [phone]

## 2013-12-12 NOTE — Progress Notes (Signed)
  Echocardiogram 2D Echocardiogram has been performed.  Donata Clay 12/12/2013, 11:12 AM

## 2013-12-12 NOTE — Evaluation (Signed)
Occupational Therapy Evaluation Patient Details Name: Martin Morris MRN: [id number] DOB: 05-03-1933 Today's Date: 12/12/2013    History of Present Illness 80 yo male s/p L THA after sustaining femoral neck fracture. hx of HTN, PVD, arthritis, BPH, NHL   Clinical Impression   Pt presents with weakness, mild L hip pain, and decreased balance.  Pt requires +2 assist for transfer to chair or commode.  Poor memory, unable to recall hip precautions or sequence with mobility. He is dependent in LB ADL. Defers questions to wife. Will need SNF for ST rehab.    Follow Up Recommendations  SNF    Equipment Recommendations       Recommendations for Other Services       Precautions / Restrictions Precautions Precautions: Fall;Posterior Hip Precaution Booklet Issued: Yes (comment) Precaution Comments: Pt unable to recall any THP - all reviewed Restrictions Weight Bearing Restrictions: No LLE Weight Bearing: Weight bearing as tolerated      Mobility Bed Mobility Overal bed mobility: +2 for physical assistance Bed Mobility: Sit to Supine     Supine to sit: +2 for physical assistance;Mod assist     General bed mobility comments: Cues for sequence, assisted for L LE and trunk.  Transfers Overall transfer level: Needs assistance Equipment used: Rolling walker (2 wheeled) Transfers: Sit to/from Stand Sit to Stand: +2 physical assistance;Min assist         General transfer comment: Cues for LE management, hand placement. Moves very slowly.    Balance Overall balance assessment: Needs assistance Sitting-balance support: Single extremity supported;Feet supported Sitting balance-Leahy Scale: Good       Standing balance-Leahy Scale: Poor                              ADL Overall ADL's : Needs assistance/impaired Eating/Feeding: Independent;Sitting   Grooming: Wash/dry hands;Wash/dry face;Set up;Sitting   Upper Body Bathing: Minimal assitance;Sitting   Lower  Body Bathing: Sit to/from stand;+2 for physical assistance;Maximal assistance   Upper Body Dressing : Minimal assistance;Sitting   Lower Body Dressing: +2 for physical assistance;Sit to/from stand;Maximal assistance   Toilet Transfer: +2 for physical assistance;Minimal assistance (bed to chair)   Toileting- Clothing Manipulation and Hygiene: +2 for physical assistance;Maximal assistance;Sit to/from stand       Functional mobility during ADLs: +2 for physical assistance;Minimal assistance General ADL Comments: Needs step by step cues for all mobility. Unaware of urinary incontinence, wife says this is his baseline.     Vision                     Perception     Praxis      Pertinent Vitals/Pain Unable to rate pain, report it is mild, iced, repositioned     Hand Dominance Right   Extremity/Trunk Assessment Upper Extremity Assessment Upper Extremity Assessment: Overall WFL for tasks assessed   Lower Extremity Assessment Lower Extremity Assessment: Defer to PT evaluation       Communication Communication Communication: No difficulties   Cognition Arousal/Alertness: Awake/alert Behavior During Therapy: WFL for tasks assessed/performed Overall Cognitive Status:  (slow processing, defers questions to wife)       Memory: Decreased short-term memory;Decreased recall of precautions             General Comments       Exercises       Shoulder Instructions      Home Living Family/patient expects to be discharged to:: Skilled  nursing facility Living Arrangements: Spouse/significant other                                      Prior Functioning/Environment               OT Diagnosis: Generalized weakness;Acute pain   OT Problem List: Decreased strength;Decreased range of motion;Impaired balance (sitting and/or standing);Decreased cognition;Decreased knowledge of use of DME or AE;Decreased knowledge of precautions;Obesity;Pain   OT  Treatment/Interventions:      OT Goals(Current goals can be found in the care plan section) Acute Rehab OT Goals Patient Stated Goal: to regain independence  OT Frequency:     Barriers to D/C:            Co-evaluation              End of Session Equipment Utilized During Treatment: Gait belt;Rolling walker Nurse Communication: Mobility status (condom cath fell off)  Activity Tolerance: Patient tolerated treatment well Patient left: in chair;with call bell/phone within reach;with family/visitor present   Time: 0820-0845 OT Time Calculation (min): 25 min Charges:  OT General Charges $OT Visit: 1 Procedure OT Evaluation $Initial OT Evaluation Tier I: 1 Procedure OT Treatments $Self Care/Home Management : 8-22 mins G-Codes:    Haze Boyden Taylia Berber Dec 28, 2013, 8:57 AM [phone]

## 2013-12-12 NOTE — Progress Notes (Signed)
Patient ID: Martin Morris, male   DOB: 19-May-1933, 80 y.o.   MRN: [id number] Subjective: 3 Days Post-Op Procedure(s) (LRB): TOTAL HIP ARTHROPLASTY (Left)    Patient reports pain as moderate.  Limited activity reported.  Foley replaced due to difficulty voiding, continues on oxygen  Objective:   VITALS:   Filed Vitals:   12/12/13 0750  BP:   Pulse:   Temp:   Resp: 18    Neurovascular intact Incision: dressing C/D/I  LABS  Recent Labs  12/10/13 0615 12/11/13 0524 12/12/13 0415  HGB 10.2* 9.8* 8.9*  HCT 30.2* 29.2* 27.3*  WBC 5.3 6.2 6.4  PLT 135* 129* 124*     Recent Labs  12/10/13 0615 12/11/13 0524  NA 138 141  K 4.4 4.2  BUN 17 15  CREATININE 1.05 1.03  GLUCOSE 121* 117*    No results found for this basename: LABPT, INR,  in the last 72 hours   Assessment/Plan: 3 Days Post-Op Procedure(s) (LRB): TOTAL HIP ARTHROPLASTY (Left)   Up with therapy Continue foley due to acute urinary retention and strict I&O Discharge to SNF when medically stable   Orthopaedically stable, doing fair, just needs therapy to increase activity

## 2013-12-12 NOTE — Progress Notes (Signed)
Clinical Social Work Department CLINICAL SOCIAL WORK PLACEMENT NOTE 12/12/2013  Patient:  WESAM, GEARHART  Account Number:  [id number] Admit date:  12/09/2013  Clinical Social Worker:  Levie Heritage  Date/time:  12/12/2013 12:37 PM  Clinical Social Work is seeking post-discharge placement for this patient at the following level of care:   SKILLED NURSING   (*CSW will update this form in Epic as items are completed)   12/12/2013  Patient/family provided with [place] Department of Clinical Social Work's list of facilities offering this level of care within the geographic area requested by the patient (or if unable, by the patient's family).  12/12/2013  Patient/family informed of their freedom to choose among providers that offer the needed level of care, that participate in Medicare, Medicaid or managed care program needed by the patient, have an available bed and are willing to accept the patient.  12/12/2013  Patient/family informed of [facility]' ownership interest in [facility], as well as of the fact that they are under no obligation to receive care at this facility.  PASARR submitted to EDS on 12/12/2013 PASARR number received from EDS on 12/12/2013  FL2 transmitted to all facilities in geographic area requested by pt/family on  12/12/2013 FL2 transmitted to all facilities within larger geographic area on   Patient informed that his/her managed care company has contracts with or will negotiate with  certain facilities, including the following:     Patient/family informed of bed offers received:   Patient chooses bed at  Physician recommends and patient chooses bed at    Patient to be transferred to  on   Patient to be transferred to facility by   The following physician request were entered in Epic:   Additional Comments:  Bernita Raisin, [place] Work [phone]

## 2013-12-12 NOTE — Progress Notes (Signed)
[facility] PROGRESS NOTE  Martin Morris JKD:[id number] DOB: 1932/08/27 DOA: 12/09/2013 PCP: [name], MD  Interim Summary I have seen and examined Mr Fana at bedside and reviewed his chart. Appreciate Orthopedics. Patient is a 80 year old male with history of mechanical fall while walking up a set of 3 stairs. He reported that he lost his balance and fell backward, landing on his left hip. X-ray in the emergency department reported mildly displaced left femoral neck fracture.  He has since had hip arthroplasty and is to d/c to SNF when bed becomes available, likely tomorrow.  Assessment/Plan:  Principal Problem:  Fracture of femoral neck, left:  - Status post hip arthroplasty done 4/16. Pain relatively well controlled. Plan is for skilled nursing facility, likely Monday  - Will need continued PT and pain control  Active Problems:  MANTLE CELL LYMPHOMA INTRA-ABDOMINAL LYMPH NODES:  - Missing this round of chemotherapy, we'll recommend he followup with his  HYPERLIPIDEMIA: Stable on statin currently  HYPERTENSION:  Stable. Blood pressure elevated likely secondary to pain . Motor otherwise relatively well controlled  CAD: Stable, no chest pain continue statin  SLEEP APNEA : Monitor. Continue nightly CPAP  Hypoxia: No reported history of COPD, we'll obtain echocardiogram to assess for pulmonary artery hypertension.  -Patient will need pulmonary function testing as outpatient  - Continue supplemental oxygen  Prolonged QT: Repeat EKG did not report prolonged QT, has resolved  Obesity: Patient meets criteria with BMI greater than 30  Code Status: Full  Family Communication: Discussed with spouse/son at bedside  Disposition Plan: Likely SNF early in the week. Consultants:  Olin-orthopedic surgery  Procedures:  Status post hip arthroplasty done 4/16  Antibiotics:  Patient to continue acyclovir and Bactrim  HPI/Subjective: Feels great. No  complaints.  Objective: Filed Vitals:   12/12/13 0750  BP:   Pulse:   Temp:   Resp: 18    Intake/Output Summary (Last 24 hours) at 12/12/13 0912 Last data filed at 12/12/13 0700  Gross per 24 hour  Intake   1200 ml  Output    750 ml  Net    450 ml   Filed Weights   12/09/13 0408  Weight: 94.3 kg (207 lb 14.3 oz)    Exam:   General:  Sitting up, eating. No complaints.  Cardiovascular: S1S2 normal. RRR.  Respiratory: Lungs clear.  Abdomen: soft, non tender.+BS.  Musculoskeletal: No bleed or drainage from surgical site. No pedal edema.  Data Reviewed: Basic Metabolic Panel:  Recent Labs Lab 12/09/13 0130 12/10/13 0615 12/11/13 0524  NA 140 138 141  K 3.8 4.4 4.2  CL 102 102 102  CO2 25 27 29   GLUCOSE 135* 121* 117*  BUN 24* 17 15  CREATININE 1.33 1.05 1.03  CALCIUM 9.1 8.1* 7.9*   Liver Function Tests: No results found for this basename: AST, ALT, ALKPHOS, BILITOT, PROT, ALBUMIN,  in the last 168 hours No results found for this basename: LIPASE, AMYLASE,  in the last 168 hours No results found for this basename: AMMONIA,  in the last 168 hours CBC:  Recent Labs Lab 12/09/13 0130 12/10/13 0615 12/11/13 0524 12/12/13 0415  WBC 6.5 5.3 6.2 6.4  NEUTROABS 4.8  --   --   --   HGB 11.9* 10.2* 9.8* 8.9*  HCT 36.0* 30.2* 29.2* 27.3*  MCV 99.2 98.7 99.3 99.6  PLT 172 135* 129* 124*   Cardiac Enzymes: No results found for this basename: CKTOTAL, CKMB, CKMBINDEX, TROPONINI,  in the last 168  hours BNP (last 3 results)  Recent Labs  12/09/13 0130  PROBNP 630.1*   CBG: No results found for this basename: GLUCAP,  in the last 168 hours  Recent Results (from the past 240 hour(s))  SURGICAL PCR SCREEN     Status: None   Collection Time    12/09/13  2:40 PM      Result Value Ref Range Status   MRSA, PCR NEGATIVE  NEGATIVE Final   Staphylococcus aureus NEGATIVE  NEGATIVE Final   Comment:            The Xpert SA Assay (FDA     approved for NASAL  specimens     in patients over 30 years of age),     is one component of     a comprehensive surveillance     program.  Test performance has     been validated by [employer] for patients greater     than or equal to 53 year old.     It is not intended     to diagnose infection nor to     guide or monitor treatment.     Studies: No results found.  Scheduled Meds: . acyclovir  400 mg Oral BID  . amLODipine  5 mg Oral Daily  . aspirin EC  325 mg Oral Daily  . atenolol  50 mg Oral Daily  . citalopram  20 mg Oral Daily  . docusate sodium  100 mg Oral BID  . enoxaparin (LOVENOX) injection  40 mg Subcutaneous Q24H  . ferrous sulfate  325 mg Oral TID PC  . polyethylene glycol  17 g Oral BID  . pravastatin  80 mg Oral q1800  . sulfamethoxazole-trimethoprim  1 tablet Oral Q M,W,F   Continuous Infusions: . sodium chloride 0.9 % 1,000 mL with potassium chloride 10 mEq infusion 50 mL/hr at 12/11/13 1822    Principal Problem:   Fracture of femoral neck, left Active Problems:   MANTLE CELL LYMPHOMA INTRA-ABDOMINAL LYMPH NODES   HYPERLIPIDEMIA   HYPERTENSION   CAD   SLEEP APNEA   Obesity (BMI 30-39.9)   Acute blood loss anemia     Maurisio Ruddy  [facility] Pager [phone]. If 7PM-7AM, please contact night-coverage at [email or web address], password [facility] 12/12/2013, 9:12 AM  LOS: 3 days

## 2013-12-13 DIAGNOSIS — D62 Acute posthemorrhagic anemia: Secondary | ICD-10-CM

## 2013-12-13 MED ORDER — ENOXAPARIN SODIUM 40 MG/0.4ML ~~LOC~~ SOLN: 40.0000 mg | SUBCUTANEOUS | Status: DC

## 2013-12-13 MED ORDER — HEPARIN SOD (PORK) LOCK FLUSH 100 UNIT/ML IV SOLN
500.0000 [IU] | INTRAVENOUS | Status: AC | PRN
  Administered 2013-12-13: 500 [IU]

## 2013-12-13 MED ORDER — DIPHENHYDRAMINE HCL 25 MG PO CAPS
25.0000 mg | ORAL_CAPSULE | Freq: Once | ORAL | Status: AC
  Administered 2013-12-13: 25 mg via ORAL
  Filled 2013-12-13: qty 1

## 2013-12-13 MED ORDER — BOOST PLUS PO LIQD: 237.0000 mL | Freq: Two times a day (BID) | ORAL | Status: AC | PRN

## 2013-12-13 NOTE — Discharge Summary (Signed)
Physician Discharge Summary  Martin Morris [id number] DOB: 10/03/1932 DOA: 12/09/2013  PCP: [name], MD  Admit date: 12/09/2013 Discharge date: 12/13/2013  Time spent: 25 minutes  Recommendations for Outpatient Follow-up:  1. Patient being discharged to skilled nursing facility for short-term rehabilitation 2. New medication: Lovenox 40 mg subcutaneous daily for DVT prophylaxis for the next 12 days 3. Medication adjustment: Patient's Plavix will be held until for the next 12 days until Lovenox complete  Discharge Diagnoses:  Principal Problem:   Fracture of femoral neck, left: Patient was seen and evaluated by orthopedic surgery. Because of his heart issues, he felt to be at moderate risk, however he was felt given his mortality without operation, best to proceed. Patient did well with surgery and underwent right hip arthroplasty. Post procedure he has been put on Lovenox for DVT prophylaxis. While he is on this, Plavix will be held. Continued on aspirin. He'll be discharged to skilled nursing facility short term for rehabilitation on 4/20. Active Problems:   MANTLE CELL LYMPHOMA INTRA-ABDOMINAL LYMPH NODES: Patient will this current cycle of chemotherapy because of his surgery as well as recovery at skilled nursing. After discharge from skilled nursing, he will resume his chemotherapy regimen schedule    HYPERLIPIDEMIA: Continue statin    HYPERTENSION: Blood pressure mildly elevated initially from pain. Actually continued on his home medications.    CAD: Stable. Patient's previous catheterization which noted some moderate multivessel disease. Aspirin continued. Plavix held. After surgery, patient will continue Lovenox for 12 days and then after that his Plavix will be resumed.    SLEEP APNEA: Stable. Patient be continued on his nightly CPAP.    Obesity (BMI 30-39.9): Patient with criteria of BMI greater than 30.    Acute blood loss anemia: Remained stable. Patient's  lowest hemoglobin was 8.9 prior to discharge. No blood transfusion was needed.   Discharge Condition: Improved, being discharged to skilled nursing facility  Diet recommendation: Heart healthy  Filed Weights   12/09/13 0408  Weight: 94.3 kg (207 lb 14.3 oz)    History of present illness:  Patient is a 80 year old male with history of mechanical fall while walking up a set of 3 stairs. He reported that he lost his balance and fell backward, landing on his left hip. X-ray in the emergency department reported mildly displaced left femoral neck fracture. Patient was admitted on 4/16 the early morning to the hospitalist service with orthopedic consultation.   Hospital Course:  Principal Problem:   Fracture of femoral neck, left Active Problems:   MANTLE CELL LYMPHOMA INTRA-ABDOMINAL LYMPH NODES   HYPERLIPIDEMIA   HYPERTENSION   CAD   SLEEP APNEA   Obesity (BMI 30-39.9)   Acute blood loss anemia  Procedures: Status post hip arthroplasty done 4/16   Consultations:  [facility]-orthopedic surgery  Discharge Exam: Filed Vitals:   12/13/13 1341  BP: 121/53  Pulse: 66  Temp: 98.6 F (37 C)  Resp: 20    General: Alert and oriented x3, no acute distress Cardiovascular: Regular rate and rhythm, [id number], soft 2/6 systolic ejection murmur Respiratory: Clear auscultation bilaterally  Discharge Instructions You were cared for by a hospitalist during your hospital stay. If you have any questions about your discharge medications or the care you received while you were in the hospital after you are discharged, you can call the unit and asked to speak with the hospitalist on call if the hospitalist that took care of you is not available. Once you are discharged, your primary care  physician will handle any further medical issues. Please note that NO REFILLS for any discharge medications will be authorized once you are discharged, as it is imperative that you return to your primary care physician  (or establish a relationship with a primary care physician if you do not have one) for your aftercare needs so that they can reassess your need for medications and monitor your lab values.  Discharge Orders   Future Orders Complete By Expires   Call MD / Call 911  As directed    Change dressing  As directed    Constipation Prevention  As directed    Diet - low sodium heart healthy  As directed    Diet - low sodium heart healthy  As directed    Discharge instructions  As directed    Driving restrictions  As directed    Increase activity slowly as tolerated  As directed    Increase activity slowly  As directed    TED hose  As directed    Weight bearing as tolerated  As directed    Questions:     Laterality:  left   Extremity:  Lower       Medication List    STOP taking these medications       naproxen sodium 220 MG tablet  Commonly known as:  ANAPROX      TAKE these medications       acyclovir 400 MG tablet  Commonly known as:  ZOVIRAX  Take 1 tablet (400 mg total) by mouth 2 (two) times daily.     amLODipine 5 MG tablet  Commonly known as:  NORVASC  Take 1 tablet (5 mg total) by mouth daily.     aspirin 81 MG tablet  Take 81 mg by mouth every other day.     atenolol 50 MG tablet  Commonly known as:  TENORMIN  Take 50 mg by mouth daily.     citalopram 20 MG tablet  Commonly known as:  CELEXA  TAKE ONE TABLET BY MOUTH ONCE DAILY     clopidogrel 75 MG tablet  Commonly known as:  PLAVIX  Take 75 mg by mouth daily.     DSS 100 MG Caps  Take 100 mg by mouth 2 (two) times daily.     enoxaparin 40 MG/0.4ML injection  Commonly known as:  LOVENOX  Inject 0.4 mLs (40 mg total) into the skin daily.     ferrous sulfate 325 (65 FE) MG tablet  Take 1 tablet (325 mg total) by mouth 3 (three) times daily after meals.     HYDROcodone-acetaminophen 5-325 MG per tablet  Commonly known as:  NORCO/VICODIN  Take 1-2 tablets by mouth every 6 (six) hours as needed for moderate  pain.     isosorbide mononitrate 120 MG 24 hr tablet  Commonly known as:  IMDUR  Take 1 tablet (120 mg total) by mouth daily.     lactose free nutrition Liqd  Take 237 mLs by mouth 2 (two) times daily as needed (Allow w/pt or pt's wife request after diet advancement).     lidocaine-prilocaine cream  Commonly known as:  EMLA  Apply 1 application topically See admin instructions. Apply to port 1 hour before treatment. Cover site with plastic wrap     methocarbamol 500 MG tablet  Commonly known as:  ROBAXIN  Take 1 tablet (500 mg total) by mouth every 6 (six) hours as needed for muscle spasms.     nitroGLYCERIN 0.4 MG  SL tablet  Commonly known as:  NITROSTAT  Place 0.4 mg under the tongue every 5 (five) minutes as needed for chest pain.     polyethylene glycol packet  Commonly known as:  MIRALAX / GLYCOLAX  Take 17 g by mouth 2 (two) times daily.     pravastatin 80 MG tablet  Commonly known as:  PRAVACHOL  Take 1 tablet (80 mg total) by mouth daily.     PRESCRIPTION MEDICATION  Inject into the vein every 21 ( twenty-one) days. Velcade and Cytoxan.     sulfamethoxazole-trimethoprim 800-160 MG per tablet  Commonly known as:  BACTRIM DS  Take 1 tablet by mouth every Monday, Wednesday, and Friday.       Allergies  Allergen Reactions  . Atorvastatin     Incredible dizziness       Follow-up Information   Follow up with Shelda Pal, MD. Schedule an appointment as soon as possible for a visit in 2 weeks.   Specialty:  Orthopedic Surgery   Contact information:   [address] [place] [address] [phone]        The results of significant diagnostics from this hospitalization (including imaging, microbiology, ancillary and laboratory) are listed below for reference.    Significant Diagnostic Studies: Dg Chest 1 View  12/09/2013   CLINICAL DATA:  Left hip pain  EXAM: CHEST - 1 VIEW  COMPARISON:  None.  FINDINGS: There is elevation of the right  diaphragm. There is no focal parenchymal opacity, pleural effusion, or pneumothorax. The heart and mediastinal contours are unremarkable. Prior CABG. Right-sided Port-A-Cath in satisfactory position.  The osseous structures are unremarkable.  IMPRESSION: No active disease.   Electronically Signed   By: Elige Ko   On: 12/09/2013 01:56   Dg Hip Complete Left  12/09/2013   CLINICAL DATA:  Fall  EXAM: LEFT HIP - COMPLETE 2+ VIEW  COMPARISON:  None.  FINDINGS: There is a mildly displaced left femoral neck fracture. There is no dislocation. The right hip is unremarkable.  IMPRESSION: Mildly displaced left femoral neck fracture.   Electronically Signed   By: Elige Ko   On: 12/09/2013 01:55   Ct Chest W Contrast  12/02/2013   CLINICAL DATA:  Lymphoma.  EXAM: CT CHEST, ABDOMEN, AND PELVIS WITH CONTRAST  TECHNIQUE: Multidetector CT imaging of the chest, abdomen and pelvis was performed following the standard protocol during bolus administration of intravenous contrast.  CONTRAST:  OMNIPAQUE IOHEXOL 300 MG/ML  SOLN  COMPARISON:  CT 05/03/2013  FINDINGS: CT CHEST FINDINGS  The chest wall is unremarkable and stable. A right-sided Port-A-Cath is noted. No supraclavicular or axillary a lymphadenopathy. No breast masses. The thyroid gland appears normal. The bony thorax is intact. Stable surgical changes from bypass surgery. Moderate degenerative changes involving the spine.  The heart is normal in size. No pericardial effusion. Stable dense atherosclerotic calcifications involving the aorta but no focal aneurysm or dissection. Three-vessel coronary artery calcifications are noted. A small hiatal hernia is present. No mediastinal or hilar mass or lymphadenopathy.  Examination of the lung parenchyma demonstrates no acute pulmonary findings. Dependent atelectasis and areas of scarring are again demonstrated. No worrisome pulmonary lesions. Vague nodularity in the right lower lobe is likely inflammatory change. The  tracheobronchial tree is unremarkable.  CT ABDOMEN AND PELVIS FINDINGS  The liver demonstrates mild diffuse fatty infiltration but no focal hepatic lesions or intrahepatic biliary dilatation. The gallbladder is normal. No common bowel duct dilatation. The pancreas  is normal. The spleen is normal in size. No focal lesions. The adrenal glands and kidneys are unremarkable.  The stomach, duodenum, small bowel and colon are unremarkable. No inflammatory changes, mass lesions or obstructive findings. A small portion of the sigmoid colon is and a left inguinal hernia but no findings for obstruction or incarceration. This is a stable finding.  No mesenteric or retroperitoneal mass or adenopathy. Stable neck dated soft tissue density the around the celiac axis and in the region of the hepatic duodenum ligament consistent with treated lymphoma. The aorta and branch vessels are patent. Stable atherosclerotic calcifications. The bladder, prostate gland and seminal vesicles are unremarkable. No pelvic mass or adenopathy. No inguinal adenopathy.  The bony structures are unremarkable.  IMPRESSION: No CT findings for recurrent lymphoma in the chest, abdomen or pelvis.   Electronically Signed   By: Kalman Jewels M.D.   On: 12/02/2013 11:41   Ct Abdomen Pelvis W Contrast  12/02/2013   CLINICAL DATA:  Lymphoma.  EXAM: CT CHEST, ABDOMEN, AND PELVIS WITH CONTRAST  TECHNIQUE: Multidetector CT imaging of the chest, abdomen and pelvis was performed following the standard protocol during bolus administration of intravenous contrast.  CONTRAST:  123mL OMNIPAQUE IOHEXOL 300 MG/ML  SOLN  COMPARISON:  CT 05/03/2013  FINDINGS: CT CHEST FINDINGS  The chest wall is unremarkable and stable. A right-sided Port-A-Cath is noted. No supraclavicular or axillary a lymphadenopathy. No breast masses. The thyroid gland appears normal. The bony thorax is intact. Stable surgical changes from bypass surgery. Moderate degenerative changes involving the  spine.  The heart is normal in size. No pericardial effusion. Stable dense atherosclerotic calcifications involving the aorta but no focal aneurysm or dissection. Three-vessel coronary artery calcifications are noted. A small hiatal hernia is present. No mediastinal or hilar mass or lymphadenopathy.  Examination of the lung parenchyma demonstrates no acute pulmonary findings. Dependent atelectasis and areas of scarring are again demonstrated. No worrisome pulmonary lesions. Vague nodularity in the right lower lobe is likely inflammatory change. The tracheobronchial tree is unremarkable.  CT ABDOMEN AND PELVIS FINDINGS  The liver demonstrates mild diffuse fatty infiltration but no focal hepatic lesions or intrahepatic biliary dilatation. The gallbladder is normal. No common bowel duct dilatation. The pancreas is normal. The spleen is normal in size. No focal lesions. The adrenal glands and kidneys are unremarkable.  The stomach, duodenum, small bowel and colon are unremarkable. No inflammatory changes, mass lesions or obstructive findings. A small portion of the sigmoid colon is and a left inguinal hernia but no findings for obstruction or incarceration. This is a stable finding.  No mesenteric or retroperitoneal mass or adenopathy. Stable neck dated soft tissue density the around the celiac axis and in the region of the hepatic duodenum ligament consistent with treated lymphoma. The aorta and branch vessels are patent. Stable atherosclerotic calcifications. The bladder, prostate gland and seminal vesicles are unremarkable. No pelvic mass or adenopathy. No inguinal adenopathy.  The bony structures are unremarkable.  IMPRESSION: No CT findings for recurrent lymphoma in the chest, abdomen or pelvis.   Electronically Signed   By: Kalman Jewels M.D.   On: 12/02/2013 11:41   Dg Pelvis Portable  12/09/2013   CLINICAL DATA:  Postoperative left hip replacement  EXAM: PORTABLE PELVIS 1-2 VIEWS  COMPARISON:  None.   FINDINGS: The patient has undergone left total hip joint prosthesis placement. Radiographic positioning of the prosthetic components is good. There is gas within the soft tissues.  IMPRESSION: The patient has undergone  left total hip joint prosthesis placement without evidence of immediate postprocedure complication.   Electronically Signed   By: David  Martinique   On: 12/09/2013 20:56    Microbiology: Recent Results (from the past 240 hour(s))  SURGICAL PCR SCREEN     Status: None   Collection Time    12/09/13  2:40 PM      Result Value Ref Range Status   MRSA, PCR NEGATIVE  NEGATIVE Final   Staphylococcus aureus NEGATIVE  NEGATIVE Final   Comment:            The Xpert SA Assay (FDA     approved for NASAL specimens     in patients over 86 years of age),     is one component of     a comprehensive surveillance     program.  Test performance has     been validated by [employer] for patients greater     than or equal to 79 year old.     It is not intended     to diagnose infection nor to     guide or monitor treatment.     Labs: Basic Metabolic Panel:  Recent Labs Lab 12/09/13 0130 12/10/13 0615 12/11/13 0524  NA 140 138 141  K 3.8 4.4 4.2  CL 102 102 102  CO2 25 27 29   GLUCOSE 135* 121* 117*  BUN 24* 17 15  CREATININE 1.33 1.05 1.03  CALCIUM 9.1 8.1* 7.9*   Liver Function Tests: No results found for this basename: AST, ALT, ALKPHOS, BILITOT, PROT, ALBUMIN,  in the last 168 hours No results found for this basename: LIPASE, AMYLASE,  in the last 168 hours No results found for this basename: AMMONIA,  in the last 168 hours CBC:  Recent Labs Lab 12/09/13 0130 12/10/13 0615 12/11/13 0524 12/12/13 0415  WBC 6.5 5.3 6.2 6.4  NEUTROABS 4.8  --   --   --   HGB 11.9* 10.2* 9.8* 8.9*  HCT 36.0* 30.2* 29.2* 27.3*  MCV 99.2 98.7 99.3 99.6  PLT 172 135* 129* 124*   Cardiac Enzymes: No results found for this basename: CKTOTAL, CKMB, CKMBINDEX, TROPONINI,  in the last  168 hours BNP: BNP (last 3 results)  Recent Labs  12/09/13 0130  PROBNP 630.1*   CBG: No results found for this basename: GLUCAP,  in the last 168 hours     Signed:  Annita Brod  [facility] 12/13/2013, 2:16 PM

## 2013-12-13 NOTE — Progress Notes (Signed)
Physical Therapy Treatment Patient Details Name: Martin Morris MRN: [id number] DOB: 03-05-1933 Today's Date: 12/13/2013    History of Present Illness 80 yo male s/p L THA after sustaining femoral neck fracture. hx of HTN, PVD, arthritis, BPH, NHL    PT Comments    Progressing with mobility. Plan is for SNF today possibly  Follow Up Recommendations  SNF     Equipment Recommendations  Rolling walker with 5" wheels    Recommendations for Other Services       Precautions / Restrictions Precautions Precautions: Posterior Hip;Fall Precaution Booklet Issued: Yes (comment) Precaution Comments: Reviewed hip precautions-pt able to recall 1/3.  Restrictions Weight Bearing Restrictions: Yes LLE Weight Bearing: Weight bearing as tolerated    Mobility  Bed Mobility Overal bed mobility: Needs Assistance Bed Mobility: Supine to Sit     Supine to sit: Mod assist;+2 for safety/equipment;+2 for physical assistance     General bed mobility comments: Cues for sequence, assisted for L LE and trunk. Increased time.   Transfers Overall transfer level: Needs assistance Equipment used: Rolling walker (2 wheeled) Transfers: Sit to/from Stand Sit to Stand: Min assist;+2 safety/equipment;+2 physical assistance         General transfer comment: Cues for LE management, hand placement. Moves very slowly. Assist to rise, stabilize, control descent  Ambulation/Gait Ambulation/Gait assistance: Min assist;+2 safety/equipment Ambulation Distance (Feet): 20 Feet Assistive device: Rolling walker (2 wheeled) Gait Pattern/deviations: Step-to pattern;Decreased stride length;Trunk flexed;Decreased step length - left;Decreased step length - right     General Gait Details: Cues for posture, sequence, stride length, increased UE WB and position from RW.  Physical assist for support, to shift wt, maintain balance, manage RW. slow gait speed. followed with recliner.    Stairs             Wheelchair Mobility    Modified Rankin (Stroke Patients Only)       Balance                                    Cognition Arousal/Alertness: Awake/alert Behavior During Therapy: WFL for tasks assessed/performed Overall Cognitive Status: Within Functional Limits for tasks assessed       Memory: Decreased short-term memory;Decreased recall of precautions              Exercises      General Comments        Pertinent Vitals/Pain L hip with activity-unrated    Home Living                      Prior Function            PT Goals (current goals can now be found in the care plan section) Progress towards PT goals: Progressing toward goals    Frequency  Min 3X/week    PT Plan Current plan remains appropriate    Co-evaluation             End of Session Equipment Utilized During Treatment: Gait belt Activity Tolerance: Patient limited by fatigue Patient left: in chair;with call bell/phone within reach;with chair alarm set;with family/visitor present     Time: 8891-6945 PT Time Calculation (min): 24 min  Charges:  $Gait Training: 8-22 mins $Therapeutic Activity: 8-22 mins                    G Codes:      Weston Anna,  MPT Pager: [phone]

## 2013-12-13 NOTE — Progress Notes (Addendum)
Patient is set to discharge to [facility] today. Patient & wife at bedside aware. Discharge packet given to RN, Magda Paganini. [facility] scheduled for 3:30pm pickup (Service Request Id: [phone]).   Clinical Social Work Department CLINICAL SOCIAL WORK PLACEMENT NOTE 12/13/2013  Patient:  ILIR, MAHRT  Account Number:  [id number] Admit date:  12/09/2013  Clinical Social Worker:  Levie Heritage  Date/time:  12/12/2013 12:37 PM  Clinical Social Work is seeking post-discharge placement for this patient at the following level of care:   SKILLED NURSING   (*CSW will update this form in Epic as items are completed)   12/12/2013  Patient/family provided with [place] Department of Clinical Social Work's list of facilities offering this level of care within the geographic area requested by the patient (or if unable, by the patient's family).  12/12/2013  Patient/family informed of their freedom to choose among providers that offer the needed level of care, that participate in Medicare, Medicaid or managed care program needed by the patient, have an available bed and are willing to accept the patient.  12/12/2013  Patient/family informed of [facility]' ownership interest in [facility], as well as of the fact that they are under no obligation to receive care at this facility.  PASARR submitted to EDS on 12/12/2013 PASARR number received from EDS on 12/12/2013  FL2 transmitted to all facilities in geographic area requested by pt/family on  12/12/2013 FL2 transmitted to all facilities within larger geographic area on   Patient informed that his/her managed care company has contracts with or will negotiate with  certain facilities, including the following:     Patient/family informed of bed offers received:  12/13/2013 Patient chooses bed at [facility] Physician recommends and patient chooses bed at    Patient to be transferred to [place] on  12/13/2013 Patient to be transferred to facility by PTAR  The following physician request were entered in Epic:   Additional Comments:   Raynaldo Opitz, [place] Worker cell #: [phone]

## 2013-12-13 NOTE — Discharge Instructions (Signed)
Partial Hip Replacement The hip joint attaches the leg to the pelvis. The joint moves and supports you when you walk. The top of the thighbone (femoral head) is in the shape of a ball. Part of the lower pelvis (acetabulum) forms a cup-shaped socket. The ball and socket are attached with bands called ligaments. Smooth tissue (cartilage) lines the surface of the ball and socket to keep the joint cushioned and to help it move. Lubricating fluid (synovial fluid) also covers joint surfaces to keep the joint healthy. A partial hip replacement most often refers to a surgery to replace only the ball part of the joint. A man-made metal material (prosthesis) replaces the worn femoral head. This surgery is often performed to alleviate the pain of arthritis or a break (fracture). It is most commonly performed for a fractured hip. LET YOUR CAREGIVER KNOW ABOUT:   Allergies to food or medicine.  Medicines taken, including vitamins, herbs, eyedrops, over-the-counter medicines, and creams.  Use of steroids (by mouth or creams).  Previous problems with anesthetics or numbing medicines.  History of bleeding problems or blood clots.  Previous surgery.  Other health problems, including diabetes and kidney problems.  Possibility of pregnancy, if this applies. RISKS AND COMPLICATIONS  As with any surgery, complications may occur. However, they can usually be managed by your caregiver. General surgical complications may include:  Reaction to anesthesia.  Damage to surrounding nerves, tissues, or structures.  Infection.  Blood clot.  Bleeding.  Scarring. Complications related to this surgery may include:  Prosthesis not staying attached to the bone.  Joint dislocation.  Continued stiffness or loss of mobility.  Continued pain. BEFORE THE PROCEDURE  It is important to follow your caregiver's instructions prior to your surgery to avoid complications. Steps before your surgery may  include:  Physical exam, blood and urine tests, X-rays, a computerized magnetic scan (MRI), bone scans, or heart tests (cardiogram or chest X-ray).  Your caregiver will review with you the surgery, the anesthesia being used, and what to expect after the surgery. You may be asked to:  Stop taking certain medicines for several days prior to your surgery, such as blood thinners (including aspirin). Your caregiver will let you know which medicines you may continue.  Lose weight.  Seek treatment for any dental conditions.  Avoid eating and drinking at least 8 hours before the surgery. This will help you avoid complications from anesthesia.  Take an antibacterial shower the night before or the morning of the surgery.  Quit smoking, if this applies. Smoking increases the chances of a healing problem after your surgery. If you are thinking about quitting, ask your surgeon how long before the surgery you should stop smoking. Ask your primary caregiver about approaches to help you stop. This can include medicines.  Arrange for someone to help you with activities during recovery. Talk with your caregiver about the need for extended care in a facility. PROCEDURE  The surgery is done after you are given medicine that makes you sleep (general anesthetic) or medicine that makes you numb from the waist down (spinal anesthetic). You will be asleep and will not feel any pain. The surgeon will make a cut (incision) in the hip to access the joint. The top of the thighbone that contains the ball is removed. The surgeon secures a new prosthetic ball joint into the healthy socket. The nearby bone may grow into the prosthesis to hold it in place, or cement (methacrylate) may be used. The incision is closed  with stitches (sutures). The surgery will take several hours to complete. AFTER THE PROCEDURE   You will most likely be in the hospital for 2 to 4 days following surgery.  You will need to lie in bed and only  move as directed.  Caregivers will help you manage pain and swelling with medicines.  You will be asked to perform breathing exercises.  A splint or brace may be applied to the hip.  You will learn specific exercises and how to walk with support. This happens within hours after surgery or the following day. A physical therapist will help you.  You will need to follow your caregiver's instructions for home care after surgery.  Recovery generally takes 3 to 6 months.  You may be asked to limit how often you bend at the waist for 6 weeks. Document Released: 01/30/2010 Document Revised: 11/04/2011 Document Reviewed: 01/30/2010 [facility] Patient Information 2014 [place].

## 2013-12-13 NOTE — Progress Notes (Signed)
Report called to Baker at [facility].  Non emergency transport set up for 1530 per social work. Pt and wife aware of transfer and deny further questions or concerns at this time.  IV team paged to deaccess port.  Garry Heater, RN 12/13/2013

## 2013-12-26 ENCOUNTER — Other Ambulatory Visit: Payer: Self-pay | Admitting: Cardiology

## 2013-12-30 ENCOUNTER — Ambulatory Visit: Payer: Medicare Other | Attending: Orthopedic Surgery | Admitting: Physical Therapy

## 2013-12-30 DIAGNOSIS — R269 Unspecified abnormalities of gait and mobility: Secondary | ICD-10-CM | POA: Insufficient documentation

## 2013-12-30 DIAGNOSIS — I10 Essential (primary) hypertension: Secondary | ICD-10-CM | POA: Diagnosis not present

## 2013-12-30 DIAGNOSIS — R5381 Other malaise: Secondary | ICD-10-CM | POA: Diagnosis not present

## 2013-12-30 DIAGNOSIS — IMO0001 Reserved for inherently not codable concepts without codable children: Secondary | ICD-10-CM | POA: Insufficient documentation

## 2013-12-30 DIAGNOSIS — Z9889 Other specified postprocedural states: Secondary | ICD-10-CM | POA: Diagnosis not present

## 2014-01-03 ENCOUNTER — Ambulatory Visit: Payer: Medicare Other | Admitting: Physical Therapy

## 2014-01-03 DIAGNOSIS — IMO0001 Reserved for inherently not codable concepts without codable children: Secondary | ICD-10-CM | POA: Diagnosis not present

## 2014-01-06 ENCOUNTER — Ambulatory Visit: Payer: Medicare Other | Admitting: *Deleted

## 2014-01-06 DIAGNOSIS — IMO0001 Reserved for inherently not codable concepts without codable children: Secondary | ICD-10-CM | POA: Diagnosis not present

## 2014-01-07 ENCOUNTER — Encounter: Payer: Medicare Other | Admitting: Physical Therapy

## 2014-01-10 ENCOUNTER — Ambulatory Visit: Payer: Medicare Other | Admitting: *Deleted

## 2014-01-10 DIAGNOSIS — IMO0001 Reserved for inherently not codable concepts without codable children: Secondary | ICD-10-CM | POA: Diagnosis not present

## 2014-01-12 ENCOUNTER — Encounter: Payer: Self-pay | Admitting: Cardiology

## 2014-01-12 ENCOUNTER — Ambulatory Visit (INDEPENDENT_AMBULATORY_CARE_PROVIDER_SITE_OTHER): Payer: Medicare Other | Admitting: Cardiology

## 2014-01-12 VITALS — BP 129/74 | HR 72 | Ht 68.0 in | Wt 204.9 lb

## 2014-01-12 DIAGNOSIS — C37 Malignant neoplasm of thymus: Secondary | ICD-10-CM

## 2014-01-12 DIAGNOSIS — I739 Peripheral vascular disease, unspecified: Secondary | ICD-10-CM

## 2014-01-12 DIAGNOSIS — I2510 Atherosclerotic heart disease of native coronary artery without angina pectoris: Secondary | ICD-10-CM

## 2014-01-12 MED ORDER — CLOPIDOGREL BISULFATE 75 MG PO TABS: 75.0000 mg | ORAL_TABLET | Freq: Every day | ORAL | Status: DC

## 2014-01-12 MED ORDER — PRAVASTATIN SODIUM 80 MG PO TABS: 80.0000 mg | ORAL_TABLET | Freq: Every day | ORAL | Status: DC

## 2014-01-12 MED ORDER — NITROGLYCERIN 0.4 MG SL SUBL: 0.4000 mg | SUBLINGUAL_TABLET | SUBLINGUAL | Status: AC | PRN

## 2014-01-12 MED ORDER — ATENOLOL 50 MG PO TABS: 50.0000 mg | ORAL_TABLET | Freq: Every day | ORAL | Status: DC

## 2014-01-12 MED ORDER — AMLODIPINE BESYLATE 5 MG PO TABS: 5.0000 mg | ORAL_TABLET | Freq: Every day | ORAL | Status: DC

## 2014-01-12 MED ORDER — ISOSORBIDE MONONITRATE ER 120 MG PO TB24: 120.0000 mg | ORAL_TABLET | Freq: Every day | ORAL | Status: DC

## 2014-01-12 NOTE — Progress Notes (Signed)
The patient presents for followup of his known coronary disease.  Most recent cardiac catheterization demonstrated LAD proximal 90% stenosis, mid long severe diffuse disease. Ostial disease and a moderate first diagonal. Ostial disease in the second diagonal. The nondominant right coronary with 90% stenosis. LIMA to the LAD had 70% stenosis in the body of it. Vein graft to the OM and PDA was occluded. Vein graft to the diagonal is occluded. We managed him medically.   Since that time he broke his hip and had to have surgery and rehab.  The patient denies any new symptoms such as chest discomfort, neck or arm discomfort. There has been no new shortness of breath, PND or orthopnea. There have been no reported palpitations, presyncope or syncope.  He is doing a lot of sitting with his feet on the ground.    Allergies  Allergen Reactions  . Atorvastatin     Incredible dizziness    Current Outpatient Prescriptions  Medication Sig Dispense Refill  . acyclovir (ZOVIRAX) 400 MG tablet Take 1 tablet (400 mg total) by mouth 2 (two) times daily.  60 tablet  2  . amLODipine (NORVASC) 5 MG tablet Take 1 tablet (5 mg total) by mouth daily.  30 tablet  6  . aspirin 81 MG tablet Take 81 mg by mouth every other day.      Marland Kitchen atenolol (TENORMIN) 50 MG tablet Take 50 mg by mouth daily.      Marland Kitchen atenolol (TENORMIN) 50 MG tablet TAKE ONE TABLET BY MOUTH ONCE DAILY  30 tablet  0  . citalopram (CELEXA) 20 MG tablet TAKE ONE TABLET BY MOUTH ONCE DAILY  30 tablet  0  . clopidogrel (PLAVIX) 75 MG tablet Take 75 mg by mouth daily.      Marland Kitchen docusate sodium 100 MG CAPS Take 100 mg by mouth 2 (two) times daily.  10 capsule  0  . enoxaparin (LOVENOX) 40 MG/0.4ML injection Inject 0.4 mLs (40 mg total) into the skin daily.  12 Syringe  0  . ferrous sulfate 325 (65 FE) MG tablet Take 1 tablet (325 mg total) by mouth 3 (three) times daily after meals.    3  . HYDROcodone-acetaminophen (NORCO/VICODIN) 5-325 MG per tablet Take 1-2  tablets by mouth every 6 (six) hours as needed for moderate pain.  100 tablet  0  . isosorbide mononitrate (IMDUR) 120 MG 24 hr tablet Take 1 tablet (120 mg total) by mouth daily.  30 tablet  1  . lactose free nutrition (BOOST PLUS) LIQD Take 237 mLs by mouth 2 (two) times daily as needed (Allow w/pt or pt's wife request after diet advancement).  60 Can  0  . lidocaine-prilocaine (EMLA) cream Apply 1 application topically See admin instructions. Apply to port 1 hour before treatment. Cover site with plastic wrap      . methocarbamol (ROBAXIN) 500 MG tablet Take 1 tablet (500 mg total) by mouth every 6 (six) hours as needed for muscle spasms.  50 tablet  0  . nitroGLYCERIN (NITROSTAT) 0.4 MG SL tablet Place 0.4 mg under the tongue every 5 (five) minutes as needed for chest pain.      . polyethylene glycol (MIRALAX / GLYCOLAX) packet Take 17 g by mouth 2 (two) times daily.  14 each  0  . pravastatin (PRAVACHOL) 80 MG tablet Take 1 tablet (80 mg total) by mouth daily.  30 tablet  6  . PRESCRIPTION MEDICATION Inject into the vein every 21 ( twenty-one) days.  Velcade and Cytoxan.      . sulfamethoxazole-trimethoprim (BACTRIM DS) 800-160 MG per tablet Take 1 tablet by mouth every Monday, Wednesday, and Friday.  24 tablet  2   No current facility-administered medications for this visit.   Facility-Administered Medications Ordered in Other Visits  Medication Dose Route Frequency Provider Last Rate Last Dose  . sodium chloride 0.9 % injection 10 mL  10 mL Intracatheter PRN Jeanie Cooks, MD   10 mL at 08/03/12 1419    Past Medical History  Diagnosis Date  . Hypertension   . CAD 11/12/2007    a. s/p CABG;  b. Cath 7/11 Patent LIMA to the LAD. Previously known occlusion of a saphenous vein graft to diagonal and sequential to obtuse marginals. Severe diffuse native obtuse marginal and diagonal branch vessel disease. Preserved ejection fraction.;  c. 03/2013 Lexi CL: mild inflat and inf ischemia, EF  54%->initial med rx.  Marland Kitchen HYPERLIPIDEMIA 11/12/2007  . HYPERTENSION 11/12/2007  . PERIPHERAL VASCULAR DISEASE 11/12/2007  . ARTHRITIS 11/12/2007  . BENIGN PROSTATIC HYPERTROPHY, HX OF 11/12/2007  . Edema 10/17/2010  . INGUINAL HERNIAS, BILATERAL 11/10/2006  . Obesity, unspecified 07/05/2009  . SLEEP APNEA 11/12/2007  . History of PTCA 2005  . Osteoarthritis   . Carotid stenosis, bilateral   . Nephrolithiasis   . thymus ca dx'd 2003    Radiation comp 2003  . NHL (non-Hodgkin's lymphoma) dx'd 2008    Chemo comp 10/2010; rituxin comp 10/2010  . 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 MANTLE CELL LYMPHOMA INTRA-ABDOMINAL LYMPH NODES 11/12/2007    Past Surgical History  Procedure Laterality Date  . Coronary artery bypass graft    . Cardiac catheterization  08/2009, 02/2010  . Total hip arthroplasty Left 12/09/2013    Procedure: TOTAL HIP ARTHROPLASTY;  Surgeon: Mauri Pole, MD;  Location: [facility];  Service: Orthopedics;  Laterality: Left;    ROS:  As stated in the HPI and negative for all other systems.  PHYSICAL EXAM BP 129/74  Pulse 72  Ht 5\' 8"  (1.727 m)  Wt 204 lb 14.4 oz (92.942 kg)  BMI 31.16 kg/m2 GENERAL:  Well appearing HEENT:  Pupils equal round and reactive, fundi not visualized, oral mucosa unremarkable NECK:  No jugular venous distention, waveform within normal limits, carotid upstroke brisk and symmetric, no bruits, no thyromegaly LUNGS:  Clear to auscultation bilaterally BACK:  No CVA tenderness CHEST:  Well healed sternotomy scar. HEART:  PMI not displaced or sustained,S1 and S2 within normal limits, no S3, no S4, no clicks, no rubs, no murmurs ABD:  Flat, positive bowel sounds normal in frequency in pitch, no bruits, no rebound, no guarding, no midline pulsatile mass, no hepatomegaly, no splenomegaly EXT:  2 plus pulses throughout, moderate right greater than left lower ext edema, no cyanosis no clubbing   ASSESSMENT AND PLAN  CAD -  He has coronary anatomy as described. He has  no new symptoms. No change in therapy or further intervention as needed.  HYPERTENSION -  The blood pressure is at target. No change in medications is indicated. We will continue with therapeutic lifestyle changes (TLC).   HYPERLIPIDEMIA -  Although he is not on target statin according to current guidelines given his ongoing medical problems I will leave him on pravastatin.  EDEMA - This seems to be increased.  I will give him 20 mg of Lasix for the next three days.  He is to keep his feet elevated.

## 2014-01-12 NOTE — Patient Instructions (Signed)
Please take Lasix (Furosemide) 20 mg a day for 3 days. Continue all other medications as listed.  Follow up in 6 months with Dr Percival Spanish.  You will receive a letter in the mail 2 months before you are due.  Please call us when you receive this letter to schedule your follow up appointment.

## 2014-01-13 ENCOUNTER — Ambulatory Visit: Payer: Medicare Other | Admitting: *Deleted

## 2014-01-13 DIAGNOSIS — IMO0001 Reserved for inherently not codable concepts without codable children: Secondary | ICD-10-CM | POA: Diagnosis not present

## 2014-01-18 ENCOUNTER — Ambulatory Visit: Payer: Medicare Other | Admitting: Physical Therapy

## 2014-01-18 DIAGNOSIS — IMO0001 Reserved for inherently not codable concepts without codable children: Secondary | ICD-10-CM | POA: Diagnosis not present

## 2014-01-20 ENCOUNTER — Ambulatory Visit: Payer: Medicare Other | Admitting: Physical Therapy

## 2014-01-20 DIAGNOSIS — IMO0001 Reserved for inherently not codable concepts without codable children: Secondary | ICD-10-CM | POA: Diagnosis not present

## 2014-01-24 ENCOUNTER — Ambulatory Visit: Payer: Medicare Other | Attending: Orthopedic Surgery | Admitting: Physical Therapy

## 2014-01-24 DIAGNOSIS — I10 Essential (primary) hypertension: Secondary | ICD-10-CM | POA: Insufficient documentation

## 2014-01-24 DIAGNOSIS — R5381 Other malaise: Secondary | ICD-10-CM | POA: Insufficient documentation

## 2014-01-24 DIAGNOSIS — IMO0001 Reserved for inherently not codable concepts without codable children: Secondary | ICD-10-CM | POA: Diagnosis not present

## 2014-01-24 DIAGNOSIS — R269 Unspecified abnormalities of gait and mobility: Secondary | ICD-10-CM | POA: Insufficient documentation

## 2014-01-24 DIAGNOSIS — Z9889 Other specified postprocedural states: Secondary | ICD-10-CM | POA: Diagnosis not present

## 2014-01-25 ENCOUNTER — Other Ambulatory Visit: Payer: Self-pay | Admitting: Internal Medicine

## 2014-01-27 ENCOUNTER — Encounter: Payer: Medicare Other | Admitting: Physical Therapy

## 2014-02-02 ENCOUNTER — Telehealth: Payer: Self-pay | Admitting: *Deleted

## 2014-02-02 ENCOUNTER — Other Ambulatory Visit: Payer: Self-pay | Admitting: Internal Medicine

## 2014-02-02 DIAGNOSIS — C8313 Mantle cell lymphoma, intra-abdominal lymph nodes: Secondary | ICD-10-CM

## 2014-02-02 NOTE — Telephone Encounter (Signed)
Wife called to say that patients appointments had been cancelled with Dr. Juliann Mule and he had been off chemotherapy since he broke his hip.  She is calling to say that he has been "released from the hip doctor" and she would like him to see Dr. Juliann Mule.  Will discuss with Dr. Juliann Mule and see when he needs to be seen.

## 2014-02-04 ENCOUNTER — Telehealth: Payer: Self-pay | Admitting: Internal Medicine

## 2014-02-04 NOTE — Telephone Encounter (Signed)
s/w wife re appt for 6/19. no availability 6/17

## 2014-02-04 NOTE — Telephone Encounter (Signed)
Wife called reporting she has not heard from scheduler about an appointment for [facility] and he needs chemotherapy.  Noted provider orders.  Instructed her to expect a call from scheduler for next week.  Asked for advance notification because we live in [place].  Request reads 02-09-2014 so this nurse instructed her to expect this date.  A scheduler will call with time.

## 2014-02-07 ENCOUNTER — Telehealth: Payer: Self-pay | Admitting: Internal Medicine

## 2014-02-07 NOTE — Telephone Encounter (Signed)
lvm for pt regarding to 6.19.15 appt time change....Md was dbl booked ok to adjust per Robin.Marland KitchenMarland Kitchen

## 2014-02-11 ENCOUNTER — Ambulatory Visit (HOSPITAL_BASED_OUTPATIENT_CLINIC_OR_DEPARTMENT_OTHER): Payer: Medicare Other | Admitting: Internal Medicine

## 2014-02-11 ENCOUNTER — Other Ambulatory Visit (HOSPITAL_BASED_OUTPATIENT_CLINIC_OR_DEPARTMENT_OTHER): Payer: Medicare Other

## 2014-02-11 ENCOUNTER — Ambulatory Visit: Payer: Medicare Other

## 2014-02-11 ENCOUNTER — Other Ambulatory Visit: Payer: Medicare Other

## 2014-02-11 ENCOUNTER — Telehealth: Payer: Self-pay | Admitting: *Deleted

## 2014-02-11 ENCOUNTER — Telehealth: Payer: Self-pay | Admitting: Internal Medicine

## 2014-02-11 ENCOUNTER — Encounter: Payer: Self-pay | Admitting: Internal Medicine

## 2014-02-11 VITALS — BP 144/73 | HR 67 | Temp 97.7°F | Resp 20 | Ht 68.0 in | Wt 208.0 lb

## 2014-02-11 DIAGNOSIS — C8313 Mantle cell lymphoma, intra-abdominal lymph nodes: Secondary | ICD-10-CM

## 2014-02-11 LAB — CBC WITH DIFFERENTIAL/PLATELET
BASO%: 0.4 % (ref 0.0–2.0)
BASOS ABS: 0 10*3/uL (ref 0.0–0.1)
EOS%: 5.8 % (ref 0.0–7.0)
Eosinophils Absolute: 0.4 10*3/uL (ref 0.0–0.5)
HEMATOCRIT: 36.3 % — AB (ref 38.4–49.9)
HEMOGLOBIN: 11.7 g/dL — AB (ref 13.0–17.1)
LYMPH%: 28.6 % (ref 14.0–49.0)
MCH: 31.8 pg (ref 27.2–33.4)
MCHC: 32.2 g/dL (ref 32.0–36.0)
MCV: 98.6 fL — ABNORMAL HIGH (ref 79.3–98.0)
MONO#: 0.7 10*3/uL (ref 0.1–0.9)
MONO%: 10.4 % (ref 0.0–14.0)
NEUT#: 3.7 10*3/uL (ref 1.5–6.5)
NEUT%: 54.8 % (ref 39.0–75.0)
PLATELETS: 193 10*3/uL (ref 140–400)
RBC: 3.68 10*6/uL — ABNORMAL LOW (ref 4.20–5.82)
RDW: 13.8 % (ref 11.0–14.6)
WBC: 6.8 10*3/uL (ref 4.0–10.3)
lymph#: 1.9 10*3/uL (ref 0.9–3.3)

## 2014-02-11 LAB — LACTATE DEHYDROGENASE (CC13): LDH: 208 U/L (ref 125–245)

## 2014-02-11 LAB — COMPREHENSIVE METABOLIC PANEL (CC13)
ALT: 13 U/L (ref 0–55)
AST: 17 U/L (ref 5–34)
Albumin: 3.6 g/dL (ref 3.5–5.0)
Alkaline Phosphatase: 91 U/L (ref 40–150)
Anion Gap: 9 mEq/L (ref 3–11)
BILIRUBIN TOTAL: 0.62 mg/dL (ref 0.20–1.20)
BUN: 14.9 mg/dL (ref 7.0–26.0)
CO2: 25 mEq/L (ref 22–29)
Calcium: 9.1 mg/dL (ref 8.4–10.4)
Chloride: 107 mEq/L (ref 98–109)
Creatinine: 1.2 mg/dL (ref 0.7–1.3)
Glucose: 146 mg/dl — ABNORMAL HIGH (ref 70–140)
Potassium: 4.4 mEq/L (ref 3.5–5.1)
Sodium: 141 mEq/L (ref 136–145)
TOTAL PROTEIN: 6.1 g/dL — AB (ref 6.4–8.3)

## 2014-02-11 NOTE — Telephone Encounter (Signed)
Per staff message and POF I have scheduled appts. Advised scheduler of appts. JMW  

## 2014-02-11 NOTE — Progress Notes (Signed)
[name], MD [place] [id number]  DIAGNOSIS: MANTLE CELL LYMPHOMA INTRA-ABDOMINAL LYMPH NODES - Plan: CBC with Differential, Comprehensive metabolic panel (Cmet) - [facility], Lactate dehydrogenase (LDH) - [facility]  Chief Complaint  Patient presents with  . Follow-up   PRINCIPLE DIAGNOSIS: Mantle cell lymphoma diagnosed in March 2008 with positive bone marrow initially on 12/03/2006 and then negative bone marrow after treatment in October 2009. As stated, the patient presented with obstructive liver abnormalities requiring ERCP and non metal stent placement by Dr. Erskine Morris. The stent was subsequently removed in October 2008.   PRIOR THERAPY: The patient was treated with 8 cycles of Rituxan, Cytoxan, vincristine and Decadron in combination with Neulasta from 12/19/2006 through 05/15/2007. The patient then received maintenance Rituxan from July 16, 2007 through February 01, 2010. While on Rituxan, he had a recurrence of his disease by CT scan carried out on 01/24/2010. Martin Morris then received treatment with bendamustine and Rituxan in combination with Neulasta for 6 cycles from 02/19/2010 through 07/26/2010. We have a prior PET scan from 08/22/2010 and CT scans of chest, abdomen, and pelvis from 01/01/2011 that showed no evidence of disease. He had been off treatment since December 2011 and had been doing well without any symptoms until the CT scan of the abdomen and pelvis on 07/01/2011 showed signs of recurrence. CT scans of abdomen and pelvis with IVC on 09/02/11 showed further progression. Rituxan, subcutaneous Velcade, intravenous Cytoxan and Decadron were initiated on 10/15/2011. CT scans of the abdomen and pelvis carried out on 01/08/2012 showed a near complete response to therapy. CT scan of the abdomen and pelvis with IV contrast carried out on 06/19/2012 showed stable plaque-like soft tissue density in the upper abdominal  retroperitoneum compared with the CT scan of 01/08/2012. CT scan of the abdomen and pelvis with IV contrast carried out on 11/30/2012 showed no evidence of recurrent lymphoma.   CURRENT THERAPY: As of 08/05/2013, treatment program will be as follows:  -Velcade 2.75 mg subcu.  -Cytoxan 400 mg IV.  -Decadron 20 mg IV.  The above drugs will be given every 3 weeks.  -Rituxan 800 mg IV every 6 weeks.  Previously Velcade, Cytoxan, and Decadron were being administered every 2 weeks, and Rituxan was being administered every 4 weeks. (from 12/04/2012 - 08/05/2013). Prior to that  beginning on 10/15/2011 - 12/04/2012, Velcade, Cytoxan, and Decadron were being administered weekly, and Rituxan was being administered every 2 weeks.  INTERIM HISTORY: Patient is in today for a followup visit prior to proceeding with his next scheduled cycle of  Velcade, Cytoxan and dexamethasone for treatment of his relapsed mantle cell lymphoma, originally diagnosed March 2008.    Of note,patient was celebrating his birthday in April and had an accidental fall resulting in a left hip fracture.  He underwent L THR on 12/09/2013 by Dr. Paralee Morris.  He was discharged to rehab and now has nearly completely recovered.  Prior to his fall, he had restaging scans including CT C/A/P on 04/09 which demonstrated absence of recurrent lymphoma in the chest, abdomen or pelvis.   In August of 2014, he was scheduled  for chemotherapy but had chest pain and it was held. He underwent a cardiac cath which revealed two of three native vessel ischemic disease on 04/13/2013 but he was a poor surgical candidate for a re-do CABG. His therapy was re-started in October 2014.   Overall he's tolerated his treatment without difficulty. His  last therapy was 11/18/2013 due to his fall and subsequent recovery.  Today, he denies chest pain. He states that he has tolerated the last few cycles without chest discomfort.  His wife is accompanying him today. He reports  a good appetite and his weight is stable.   MEDICAL HISTORY: Past Medical History  Diagnosis Date  . Hypertension   . CAD 11/12/2007    a. s/p CABG;  b. Cath 7/11 Patent LIMA to the LAD. Previously known occlusion of a saphenous vein graft to diagonal and sequential to obtuse marginals. Severe diffuse native obtuse marginal and diagonal branch vessel disease. Preserved ejection fraction.;  c. 03/2013 Lexi CL: mild inflat and inf ischemia, EF 54%->initial med rx.  Marland Kitchen HYPERLIPIDEMIA 11/12/2007  . HYPERTENSION 11/12/2007  . PERIPHERAL VASCULAR DISEASE 11/12/2007  . ARTHRITIS 11/12/2007  . BENIGN PROSTATIC HYPERTROPHY, HX OF 11/12/2007  . Edema 10/17/2010  . INGUINAL HERNIAS, BILATERAL 11/10/2006  . Obesity, unspecified 07/05/2009  . SLEEP APNEA 11/12/2007  . History of PTCA 2005  . Osteoarthritis   . Carotid stenosis, bilateral   . Nephrolithiasis   . thymus ca dx'd 2003    Radiation comp 2003  . NHL (non-Hodgkin's lymphoma) dx'd 2008    Chemo comp 10/2010; rituxin comp 10/2010  . 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 MANTLE CELL LYMPHOMA INTRA-ABDOMINAL LYMPH NODES 11/12/2007    INTERIM HISTORY: has CANCER, THYMUS; MANTLE CELL LYMPHOMA INTRA-ABDOMINAL LYMPH NODES; HYPERLIPIDEMIA; Obesity, unspecified; HYPERTENSION; CAD; PERIPHERAL VASCULAR DISEASE; INGUINAL HERNIAS, BILATERAL; NEPHROLITHIASIS, RECURRENT; ARTHRITIS; SLEEP APNEA; BENIGN PROSTATIC HYPERTROPHY, HX OF; EDEMA; Edema; Bruising; Ulcer of lower limb, unspecified; Midsternal chest pain; Fracture of femoral neck, left; Obesity (BMI 30-39.9); and Acute blood loss anemia on his problem list.    ALLERGIES:  is allergic to atorvastatin.  MEDICATIONS: has a current medication list which includes the following prescription(s): acyclovir, amlodipine, aspirin, atenolol, citalopram, clopidogrel, hydrocodone-acetaminophen, isosorbide mononitrate, lactose free nutrition, lidocaine-prilocaine, methocarbamol, nitroglycerin, pravastatin, PRESCRIPTION MEDICATION, and  sulfamethoxazole-trimethoprim, and the following Facility-Administered Medications: sodium chloride.  SURGICAL HISTORY:  Past Surgical History  Procedure Laterality Date  . Coronary artery bypass graft    . Cardiac catheterization  08/2009, 02/2010  . Total hip arthroplasty Left 12/09/2013    Procedure: TOTAL HIP ARTHROPLASTY;  Surgeon: Mauri Pole, MD;  Location: [facility];  Service: Orthopedics;  Laterality: Left;   REVIEW OF SYSTEMS:   Constitutional: Denies fevers, chills or abnormal weight loss Eyes: Denies blurriness of vision Ears, nose, mouth, throat, and face: Denies mucositis or sore throat Respiratory: Denies cough, dyspnea or wheezes Cardiovascular: Denies palpitation, chest discomfort or lower extremity swelling Gastrointestinal:  Denies nausea, heartburn or change in bowel habits Skin: Denies abnormal skin rashes Lymphatics: Denies new lymphadenopathy or easy bruising Neurological:Denies numbness, tingling or new weaknesses Behavioral/Psych: Mood is stable, no new changes  All other systems were reviewed with the patient and are negative.  PHYSICAL EXAMINATION: ECOG PERFORMANCE STATUS: 0 - Asymptomatic  Blood pressure 144/73, pulse 67, temperature 97.7 F (36.5 C), temperature source Oral, resp. rate 20, height 5\' 8"  (1.727 m), weight 208 lb (94.348 kg), SpO2 97.00%.  GENERAL:alert, no distress and comfortable;well developed and well nourished.  SKIN: skin color, texture, turgor are normal, no rashes or significant lesions EYES: normal, Conjunctiva are pink and non-injected, sclera clear OROPHARYNX:no exudate, no erythema and lips, buccal mucosa, and tongue normal  NECK: supple, thyroid normal size, non-tender, without nodularity LYMPH:  no palpable lymphadenopathy in the cervical, axillary or supraclavicular LUNGS: clear to auscultation and percussion with  normal breathing effort HEART: regular rate & rhythm and no murmurs and no lower extremity edema ABDOMEN:abdomen  soft, non-tender and normal bowel sounds Musculoskeletal:no cyanosis of digits and no clubbing  NEURO: alert & oriented x 3 with fluent speech, no focal motor/sensory deficits  Labs:  Lab Results  Component Value Date   WBC 6.8 02/11/2014   HGB 11.7* 02/11/2014   HCT 36.3* 02/11/2014   MCV 98.6* 02/11/2014   PLT 193 02/11/2014   NEUTROABS 3.7 02/11/2014      Chemistry      Component Value Date/Time   NA 141 02/11/2014 1334   NA 141 12/11/2013 0524   NA 142 09/02/2011 0936   K 4.4 02/11/2014 1334   K 4.2 12/11/2013 0524   K 4.1 09/02/2011 0936   CL 102 12/11/2013 0524   CL 106 01/29/2013 0900   CL 98 09/02/2011 0936   CO2 25 02/11/2014 1334   CO2 29 12/11/2013 0524   CO2 29 09/02/2011 0936   BUN 14.9 02/11/2014 1334   BUN 15 12/11/2013 0524   BUN 17 09/02/2011 0936   CREATININE 1.2 02/11/2014 1334   CREATININE 1.03 12/11/2013 0524   CREATININE 0.8 09/02/2011 0936      Component Value Date/Time   CALCIUM 9.1 02/11/2014 1334   CALCIUM 7.9* 12/11/2013 0524   CALCIUM 8.5 09/02/2011 0936   ALKPHOS 91 02/11/2014 1334   ALKPHOS 59 04/08/2012 0850   ALKPHOS 72 09/02/2011 0936   AST 17 02/11/2014 1334   AST 21 04/08/2012 0850   AST 21 09/02/2011 0936   ALT 13 02/11/2014 1334   ALT 14 04/08/2012 0850   ALT 17 09/02/2011 0936   BILITOT 0.62 02/11/2014 1334   BILITOT 0.6 04/08/2012 0850   BILITOT 0.60 09/02/2011 0936     RADIOGRAPHIC STUDIES:  1. CT scan of chest, abdomen and pelvis with IV contrast on 01/01/2011 were negative for evidence of recurrent lymphoma.  2. PET scan from 08/22/2010 showed no evidence for recurrent lymphoma.  3. CT scan of chest, abdomen and pelvis with IV contrast on 01/01/2011 showed no evidence for lymphoma.  4. CT scan of abdomen and pelvis on 07/01/2011 showed interval development of peritoneal disease worrisome for recurrence of lymphoma. There was new right external iliac lymph node pathologically enlarged and worrisome for recurrent lymphoma. There was some stable appearance of mild soft  tissue stranding surrounding the celiac trunk and superior mesenteric artery.  5. Chest x-ray, 2 view, from 09/02/2011 showed no acute findings.  6. CT scan of abdomen and pelvis with IV contrast on 09/02/2011 showed interval progression of lymphadenopathy in the abdomen and pelvis. 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 The 1.1 x 1.7 cm omental nodule which was measured on the prior study of 07/01/2011 now measures 4.1 x 2.4 cm. A 2nd discrete soft tissue nodule in the omentum which previously measured 1.1 x 1.5 cm now measures 2.2 x 3.0 cm.  7. CT scan of abdomen and pelvis with IV contrast on 01/08/2012 showed no residual measurable mass along the anterior aspect of the right diaphragm at the site of the previously demonstrated 4.9 x 2.1 cm nodule. There is no residual measurable gastrohepatic or celiac lymphadenopathy. There is some soft tissue stranding around the proximal abdominal aorta, celiac trunk and superior mesenteric artery as noted on images 27-34. Pelvic lymphadenopathy is nearly completely resolved. A right external iliac node measuring 6 mm on image 66 previously measured 9 mm. Another node which previously measured 17 mm short axis now  measures 8 mm on image 70. There is no residual inguinal adenopathy. The spleen remains normal in size, demonstrates no focal abnormalities. There are no suspect osseous findings. There is no residual mass at the left posterior costophrenic angle. The final impression was near complete response to therapy in the lymphadenopathy and omental disease with some  minimal residual measurable disease as previously noted. There was an enlarged left inguinal hernia containing a knuckle of sigmoid colon with  no evidence for incarceration or bowel obstruction. There was a nonobstructing left renal calculus.  8. CT scan of abdomen and pelvis with IV contrast carried out on 06/19/2012 was compared with the scan of 01/08/2012 and showed stable plaque-like soft tissue density in the  upper abdominal retroperitoneum, which had the appearance of treated lymphoma. No new or progressive disease within the abdomen or pelvis was noted. There is a stable incidental finding of a small left inguinal hernia and diverticulosis.  9. Chest x-ray, 2-view, from 09/07/2012 showed low lung volumes with streaky basilar atelectasis.  10. CT scan of abdomen and pelvis with IV contrast from 11/30/2012 compared with the prior CT scans from 06/19/2012 showed no significant changes and no evidence of recurrent lymphoma or other acute findings.  11. Nuclear Myocardial scan (04/09/2013) Lexiscan Sestamibi: Clinically negative, electrically negative for ischemia. MIBI scan with evidence of a ischemia in the inferolateral wall and mild ischemia in the inferior wall. Overall region small to medium in size. LVEF on gating calculated at 54%.  12. CT scan of chest, abdomen and pelvis with IV contrast (04/08/2013) when compared to 11/30/2012 showed no evidence of lymphomatous involvement in the chest. No evidence of lymphomatous involvement in the abdomen/pelvis and mildly thick-walled bladder, correlate for cystitis. 13. CT scan of abdomen and pelvis with IV contrast (08/04/2013) showed no evidence of recurrent disease in the abdomen pelvis.  14. CT scan of chest, abdomen and pelvis with IV contrast (12/02/2013) showed no evidence of recurrent disease in the chest, abdomen and pelvis.   ASSESSMENT: Martin Morris 80 y.o. male with a history of [place] - Plan: CBC with Differential, Comprehensive metabolic panel (Cmet) - [facility], Lactate dehydrogenase (LDH) - [facility]   PLAN:  1. Mantle cell lymphoma, now in remission.  -- CT of abdomen and pelvis as noted above on 12/02/2013 demonstrated his disease is now in remission. We balanced continuing his chemotherapy and its benefit on controlling his mantle cell lymphoma with the risk of his ongoing chest discomfort that he associates  with his therapy. We reviewed his nuclear myocardial scans and EKG and cardiac cath report. We provided him a brief holiday from his therapy given his recent fall and Left hip fracture requiring a total hip replacement.  We will resum his chemotherapy on Wednesday, 02/16/2014. He voiced agreement to continuing chemotherapy in the setting of his cardiac history. He has tolerated therapy well without additional chest pain.   We will see him monthly and repeat scans every 4 months to assess for reoccurrence.   --On Wednesday, he is scheduled to receive the following:         Velcade 1.3 mg per meter square,       Cytoxan 190 mg per meter square,       Decadron 20 mg IV,       He will return in 3 weeks for his next cycle (with rituximab). Previously, he has been provided handouts detailing each chemotherapy agent including each agent's indications, side effects,etc.  He understands a risk of marrow suppression and if he has a fever of 100.5 or greater to call the M.D. on call and/or report to the emergency room.  He understood these risks and benefits as detailed above and decided to continue his chemotherapy.   --We obtained a CT of abdomen pelvis on 08/04/2013 for re-staging as indicated above. Since it is stable, we started a q 3 week cycle instead of q 2 week cycle to allow him more time to recuperate between cycles and decrease risk of chest pain reoccurrence. He voiced agreement to this plan.   2. Chest pain, resolved.  -- His recent cardiac catherization (04/13/2013) revealed severe native vessel disease two of three grafts occluded. The LIMA to the LAD has moderate graft stenosis. Preserved EF but a poor candidate for CABG. He is asymptomatic for chest pain presently.  He is handling his basic and intermediate ADLs without difficulty.    3. S/p L TH (12/09/2013)R. --He reports that he is near his baseline.  He denies pain.   4. Follow-up. RTC for chemotherapy pre-visit in 3 weeks with  chemistries, complete blood count, and chemotherapy.   All questions were answered. The patient knows to call the clinic with any problems, questions or concerns. We can certainly see the patient much sooner if necessary. Was provided and after visit summary.  I spent 15 minutes counseling the patient face to face. The total time spent in the appointment was 25 minutes.    CHISM, DAVID, MD 02/12/2014 10:28 AM

## 2014-02-11 NOTE — Telephone Encounter (Signed)
Gave pt appt for lab,md and chemo for June and July

## 2014-02-16 ENCOUNTER — Ambulatory Visit (HOSPITAL_BASED_OUTPATIENT_CLINIC_OR_DEPARTMENT_OTHER): Payer: Medicare Other

## 2014-02-16 VITALS — BP 144/71 | Temp 97.9°F | Resp 18

## 2014-02-16 DIAGNOSIS — Z5111 Encounter for antineoplastic chemotherapy: Secondary | ICD-10-CM

## 2014-02-16 DIAGNOSIS — Z5112 Encounter for antineoplastic immunotherapy: Secondary | ICD-10-CM

## 2014-02-16 DIAGNOSIS — C8313 Mantle cell lymphoma, intra-abdominal lymph nodes: Secondary | ICD-10-CM

## 2014-02-16 MED ORDER — DEXAMETHASONE SODIUM PHOSPHATE 4 MG/ML IJ SOLN
20.0000 mg | Freq: Once | INTRAMUSCULAR | Status: AC
  Administered 2014-02-16: 20 mg via INTRAVENOUS

## 2014-02-16 MED ORDER — HEPARIN SOD (PORK) LOCK FLUSH 100 UNIT/ML IV SOLN
500.0000 [IU] | Freq: Once | INTRAVENOUS | Status: AC | PRN
  Administered 2014-02-16: 500 [IU]
  Filled 2014-02-16: qty 5

## 2014-02-16 MED ORDER — SODIUM CHLORIDE 0.9 % IV SOLN
190.0000 mg/m2 | Freq: Once | INTRAVENOUS | Status: AC
  Administered 2014-02-16: 400 mg via INTRAVENOUS
  Filled 2014-02-16: qty 20

## 2014-02-16 MED ORDER — SODIUM CHLORIDE 0.9 % IJ SOLN
10.0000 mL | INTRAMUSCULAR | Status: DC | PRN
  Administered 2014-02-16: 10 mL
  Filled 2014-02-16: qty 10

## 2014-02-16 MED ORDER — DEXAMETHASONE SODIUM PHOSPHATE 20 MG/5ML IJ SOLN
INTRAMUSCULAR | Status: AC
  Filled 2014-02-16: qty 5

## 2014-02-16 MED ORDER — ONDANSETRON 8 MG/50ML IVPB (CHCC)
8.0000 mg | Freq: Once | INTRAVENOUS | Status: AC
  Administered 2014-02-16: 8 mg via INTRAVENOUS

## 2014-02-16 MED ORDER — ONDANSETRON 8 MG/NS 50 ML IVPB
INTRAVENOUS | Status: AC
  Filled 2014-02-16: qty 8

## 2014-02-16 MED ORDER — BORTEZOMIB CHEMO SQ INJECTION 3.5 MG (2.5MG/ML)
1.3000 mg/m2 | Freq: Once | INTRAMUSCULAR | Status: AC
  Administered 2014-02-16: 2.75 mg via SUBCUTANEOUS
  Filled 2014-02-16: qty 2.75

## 2014-02-16 MED ORDER — SODIUM CHLORIDE 0.9 % IV SOLN
Freq: Once | INTRAVENOUS | Status: AC
Start: 2014-02-16 — End: 2014-02-16
  Administered 2014-02-16: 11:00:00 via INTRAVENOUS

## 2014-02-16 NOTE — Patient Instructions (Signed)
[facility] Discharge Instructions for Patients Receiving Chemotherapy  Today you received the following chemotherapy agents Cytoxan/ Velcade  To help prevent nausea and vomiting after your treatment, we encourage you to take your nausea medication     If you develop nausea and vomiting that is not controlled by your nausea medication, call the clinic.   BELOW ARE SYMPTOMS THAT SHOULD BE REPORTED IMMEDIATELY:  *FEVER GREATER THAN 100.5 F  *CHILLS WITH OR WITHOUT FEVER  NAUSEA AND VOMITING THAT IS NOT CONTROLLED WITH YOUR NAUSEA MEDICATION  *UNUSUAL SHORTNESS OF BREATH  *UNUSUAL BRUISING OR BLEEDING  TENDERNESS IN MOUTH AND THROAT WITH OR WITHOUT PRESENCE OF ULCERS  *URINARY PROBLEMS  *BOWEL PROBLEMS  UNUSUAL RASH Items with * indicate a potential emergency and should be followed up as soon as possible.  Feel free to call the clinic you have any questions or concerns. The clinic phone number is [phone].

## 2014-02-28 ENCOUNTER — Other Ambulatory Visit: Payer: Self-pay | Admitting: Internal Medicine

## 2014-03-08 ENCOUNTER — Ambulatory Visit (HOSPITAL_COMMUNITY)
Admission: RE | Admit: 2014-03-08 | Discharge: 2014-03-08 | Disposition: A | Discharge status: Home / Self Care | Payer: Medicare Other | Source: Ambulatory Visit | Attending: Internal Medicine | Admitting: Internal Medicine

## 2014-03-08 ENCOUNTER — Other Ambulatory Visit (HOSPITAL_BASED_OUTPATIENT_CLINIC_OR_DEPARTMENT_OTHER): Payer: Medicare Other

## 2014-03-08 ENCOUNTER — Telehealth: Payer: Self-pay | Admitting: Internal Medicine

## 2014-03-08 ENCOUNTER — Ambulatory Visit (HOSPITAL_BASED_OUTPATIENT_CLINIC_OR_DEPARTMENT_OTHER): Payer: Medicare Other | Admitting: Internal Medicine

## 2014-03-08 ENCOUNTER — Encounter: Payer: Self-pay | Admitting: Internal Medicine

## 2014-03-08 VITALS — BP 150/71 | HR 81 | Temp 98.0°F | Resp 17 | Ht 68.0 in | Wt 208.6 lb

## 2014-03-08 DIAGNOSIS — C8313 Mantle cell lymphoma, intra-abdominal lymph nodes: Secondary | ICD-10-CM

## 2014-03-08 DIAGNOSIS — M7989 Other specified soft tissue disorders: Secondary | ICD-10-CM | POA: Insufficient documentation

## 2014-03-08 DIAGNOSIS — R609 Edema, unspecified: Secondary | ICD-10-CM

## 2014-03-08 LAB — COMPREHENSIVE METABOLIC PANEL (CC13)
ALT: 15 U/L (ref 0–55)
AST: 21 U/L (ref 5–34)
Albumin: 3.9 g/dL (ref 3.5–5.0)
Alkaline Phosphatase: 101 U/L (ref 40–150)
Anion Gap: 9 mEq/L (ref 3–11)
BUN: 18.3 mg/dL (ref 7.0–26.0)
CO2: 28 mEq/L (ref 22–29)
Calcium: 9.5 mg/dL (ref 8.4–10.4)
Chloride: 106 mEq/L (ref 98–109)
Creatinine: 1.2 mg/dL (ref 0.7–1.3)
Glucose: 127 mg/dl (ref 70–140)
Potassium: 4.2 mEq/L (ref 3.5–5.1)
Sodium: 143 mEq/L (ref 136–145)
Total Bilirubin: 0.67 mg/dL (ref 0.20–1.20)
Total Protein: 6.8 g/dL (ref 6.4–8.3)

## 2014-03-08 LAB — CBC WITH DIFFERENTIAL/PLATELET
BASO%: 0.6 % (ref 0.0–2.0)
BASOS ABS: 0 10*3/uL (ref 0.0–0.1)
EOS%: 4.9 % (ref 0.0–7.0)
Eosinophils Absolute: 0.3 10*3/uL (ref 0.0–0.5)
HCT: 40 % (ref 38.4–49.9)
HEMOGLOBIN: 13.2 g/dL (ref 13.0–17.1)
LYMPH%: 30.2 % (ref 14.0–49.0)
MCH: 32.2 pg (ref 27.2–33.4)
MCHC: 33 g/dL (ref 32.0–36.0)
MCV: 97.6 fL (ref 79.3–98.0)
MONO#: 0.7 10*3/uL (ref 0.1–0.9)
MONO%: 10.5 % (ref 0.0–14.0)
NEUT#: 3.4 10*3/uL (ref 1.5–6.5)
NEUT%: 53.8 % (ref 39.0–75.0)
Platelets: 202 10*3/uL (ref 140–400)
RBC: 4.1 10*6/uL — AB (ref 4.20–5.82)
RDW: 14 % (ref 11.0–14.6)
WBC: 6.3 10*3/uL (ref 4.0–10.3)
lymph#: 1.9 10*3/uL (ref 0.9–3.3)

## 2014-03-08 LAB — LACTATE DEHYDROGENASE (CC13): LDH: 271 U/L — ABNORMAL HIGH (ref 125–245)

## 2014-03-08 NOTE — Progress Notes (Signed)
VASCULAR LAB PRELIMINARY  PRELIMINARY  PRELIMINARY  PRELIMINARY  Bilateral lower extremity venous duplex  completed.    Preliminary report:  Bilateral:  No evidence of DVT, superficial thrombosis, or Baker's Cyst.   Technically limited in the calves, right greater than left.  Interstitial fluid noted on the left.    Martin Morris, RVT 03/08/2014, 12:46 PM

## 2014-03-08 NOTE — Progress Notes (Signed)
[place], [place] [address]  DIAGNOSIS: MANTLE CELL LYMPHOMA INTRA-ABDOMINAL LYMPH NODES - Plan: CBC with Differential, Comprehensive metabolic panel (Cmet) - [facility], Lactate dehydrogenase (LDH) - [facility]  Edema - Plan: Lower Extremity Venous Duplex Bilateral  Chief Complaint  Patient presents with  . MANTLE CELL LYMPHOMA INTRA-ABDOMINAL LYMPH NODES   PRINCIPLE DIAGNOSIS: Mantle cell lymphoma diagnosed in March 2008 with positive bone marrow initially on 12/03/2006 and then negative bone marrow after treatment in October 2009. As stated, the patient presented with obstructive liver abnormalities requiring ERCP and non metal stent placement by Martin Morris. The stent was subsequently removed in October 2008.   PRIOR THERAPY: The patient was treated with 8 cycles of Rituxan, Cytoxan, vincristine and Decadron in combination with Neulasta from 12/19/2006 through 05/15/2007. The patient then received maintenance Rituxan from July 16, 2007 through February 01, 2010. While on Rituxan, he had a recurrence of his disease by CT scan carried out on 01/24/2010. Martin Morris then received treatment with bendamustine and Rituxan in combination with Neulasta for 6 cycles from 02/19/2010 through 07/26/2010. We have a prior PET scan from 08/22/2010 and CT scans of chest, abdomen, and pelvis from 01/01/2011 that showed no evidence of disease. He had been off treatment since December 2011 and had been doing well without any symptoms until the CT scan of the abdomen and pelvis on 07/01/2011 showed signs of recurrence. CT scans of abdomen and pelvis with IVC on 09/02/11 showed further progression. Rituxan, subcutaneous Velcade, intravenous Cytoxan and Decadron were initiated on 10/15/2011. CT scans of the abdomen and pelvis carried out on 01/08/2012 showed a near complete response to therapy. CT scan of the abdomen and pelvis with IV contrast  carried out on 06/19/2012 showed stable plaque-like soft tissue density in the upper abdominal retroperitoneum compared with the CT scan of 01/08/2012. CT scan of the abdomen and pelvis with IV contrast carried out on 11/30/2012 showed no evidence of recurrent lymphoma.   CURRENT THERAPY: As of 08/05/2013, treatment program will be as follows:  -Velcade 2.75 mg subcu.  -Cytoxan 400 mg IV.  -Decadron 20 mg IV.  The above drugs will be given every 3 weeks.  -Rituxan 800 mg IV every 6 weeks.  Previously Velcade, Cytoxan, and Decadron were being administered every 2 weeks, and Rituxan was being administered every 4 weeks. (from 12/04/2012 - 08/05/2013). Prior to that  beginning on 10/15/2011 - 12/04/2012, Velcade, Cytoxan, and Decadron were being administered weekly, and Rituxan was being administered every 2 weeks.  INTERIM HISTORY: Patient is in today for a followup visit prior to proceeding with his next scheduled cycle of  Rituximab, Velcade, Cytoxan and dexamethasone for treatment of his relapsed mantle cell lymphoma, originally diagnosed March 2008.    Of note, patient was celebrating his birthday in April and had an accidental fall resulting in a left hip fracture.  He underwent L THR on 12/09/2013 by Martin Morris.  He was discharged to rehab and now is nearly completely recovered.  Prior to his fall, he had restaging scans including CT C/A/P on 04/09 which demonstrated absence of recurrent lymphoma in the chest, abdomen or pelvis.   In August of 2014, he was scheduled  for chemotherapy but had chest pain and it was held. He underwent a cardiac cath which revealed two of three native vessel ischemic disease on 04/13/2013 but he was a poor surgical candidate for a re-do CABG. His  therapy was re-started in October 2014.   Overall he's tolerated his treatment without difficulty. His last therapy was 11/18/2013 due to his fall and subsequent recovery.    Today, he denies chest pain. He states that  he has tolerated the last few cycles without chest discomfort.  His wife is accompanying him today. He reports a good appetite and his weight is stable. He had a minor fall while changing his lightbulb from a ladder.  He denied head trauma.  It was witnessed with minor abrasion now well healed.   MEDICAL HISTORY: Past Medical History  Diagnosis Date  . Hypertension   . CAD 11/12/2007    a. s/p CABG;  b. Cath 7/11 Patent LIMA to the LAD. Previously known occlusion of a saphenous vein graft to diagonal and sequential to obtuse marginals. Severe diffuse native obtuse marginal and diagonal branch vessel disease. Preserved ejection fraction.;  c. 03/2013 Lexi CL: mild inflat and inf ischemia, EF 54%->initial med rx.  Marland Kitchen HYPERLIPIDEMIA 11/12/2007  . HYPERTENSION 11/12/2007  . PERIPHERAL VASCULAR DISEASE 11/12/2007  . ARTHRITIS 11/12/2007  . BENIGN PROSTATIC HYPERTROPHY, HX OF 11/12/2007  . Edema 10/17/2010  . INGUINAL HERNIAS, BILATERAL 11/10/2006  . Obesity, unspecified 07/05/2009  . SLEEP APNEA 11/12/2007  . History of PTCA 2005  . Osteoarthritis   . Carotid stenosis, bilateral   . Nephrolithiasis   . thymus ca dx'd 2003    Radiation comp 2003  . NHL (non-Hodgkin's lymphoma) dx'd 2008    Chemo comp 10/2010; rituxin comp 10/2010  . 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 MANTLE CELL LYMPHOMA INTRA-ABDOMINAL LYMPH NODES 11/12/2007    INTERIM HISTORY: has CANCER, THYMUS; MANTLE CELL LYMPHOMA INTRA-ABDOMINAL LYMPH NODES; HYPERLIPIDEMIA; Obesity, unspecified; HYPERTENSION; CAD; PERIPHERAL VASCULAR DISEASE; INGUINAL HERNIAS, BILATERAL; NEPHROLITHIASIS, RECURRENT; ARTHRITIS; SLEEP APNEA; BENIGN PROSTATIC HYPERTROPHY, HX OF; EDEMA; Edema; Bruising; Ulcer of lower limb, unspecified; Midsternal chest pain; Fracture of femoral neck, left; Obesity (BMI 30-39.9); and Acute blood loss anemia on his problem list.    ALLERGIES:  is allergic to atorvastatin.  MEDICATIONS: has a current medication list which includes the following  prescription(s): acyclovir, amlodipine, aspirin, atenolol, citalopram, clopidogrel, doxycycline, hydrocodone-acetaminophen, isosorbide mononitrate, lactose free nutrition, lidocaine-prilocaine, methocarbamol, nitroglycerin, pravastatin, PRESCRIPTION MEDICATION, and sulfamethoxazole-trimethoprim, and the following Facility-Administered Medications: sodium chloride.  SURGICAL HISTORY:  Past Surgical History  Procedure Laterality Date  . Coronary artery bypass graft    . Cardiac catheterization  08/2009, 02/2010  . Total hip arthroplasty Left 12/09/2013    Procedure: TOTAL HIP ARTHROPLASTY;  Surgeon: Mauri Pole, MD;  Location: [facility];  Service: Orthopedics;  Laterality: Left;   REVIEW OF SYSTEMS:   Constitutional: Denies fevers, chills or abnormal weight loss Eyes: Denies blurriness of vision Ears, nose, mouth, throat, and face: Denies mucositis or sore throat Respiratory: Denies cough, dyspnea or wheezes Cardiovascular: Denies palpitation, chest discomfort or lower extremity swelling Gastrointestinal:  Denies nausea, heartburn or change in bowel habits Skin: Denies abnormal skin rashes Lymphatics: Denies new lymphadenopathy or easy bruising Neurological:Denies numbness, tingling or new weaknesses Behavioral/Psych: Mood is stable, no new changes  All other systems were reviewed with the patient and are negative.  PHYSICAL EXAMINATION: ECOG PERFORMANCE STATUS: 0 - Asymptomatic  Blood pressure 150/71, pulse 81, temperature 98 F (36.7 C), temperature source Oral, resp. rate 17, height 5\' 8"  (1.727 m), weight 208 lb 9.6 oz (94.62 kg), SpO2 96.00%.  GENERAL:alert, no distress and comfortable;well developed and well nourished.  SKIN: skin color, texture, turgor are normal, no rashes or significant  lesions EYES: normal, Conjunctiva are pink and non-injected, sclera clear OROPHARYNX:no exudate, no erythema and lips, buccal mucosa, and tongue normal  NECK: supple, thyroid normal size,  non-tender, without nodularity LYMPH:  no palpable lymphadenopathy in the cervical, axillary or supraclavicular LUNGS: clear to auscultation and percussion with normal breathing effort HEART: regular rate & rhythm and no murmurs and no lower extremity edema ABDOMEN:abdomen soft, non-tender and normal bowel sounds Musculoskeletal:no cyanosis of digits and no clubbing  NEURO: alert & oriented x 3 with fluent speech, no focal motor/sensory deficits  Labs:  Lab Results  Component Value Date   WBC 6.3 03/08/2014   HGB 13.2 03/08/2014   HCT 40.0 03/08/2014   MCV 97.6 03/08/2014   PLT 202 03/08/2014   NEUTROABS 3.4 03/08/2014      Chemistry      Component Value Date/Time   NA 143 03/08/2014 1041   NA 141 12/11/2013 0524   NA 142 09/02/2011 0936   K 4.2 03/08/2014 1041   K 4.2 12/11/2013 0524   K 4.1 09/02/2011 0936   CL 102 12/11/2013 0524   CL 106 01/29/2013 0900   CL 98 09/02/2011 0936   CO2 28 03/08/2014 1041   CO2 29 12/11/2013 0524   CO2 29 09/02/2011 0936   BUN 18.3 03/08/2014 1041   BUN 15 12/11/2013 0524   BUN 17 09/02/2011 0936   CREATININE 1.2 03/08/2014 1041   CREATININE 1.03 12/11/2013 0524   CREATININE 0.8 09/02/2011 0936      Component Value Date/Time   CALCIUM 9.5 03/08/2014 1041   CALCIUM 7.9* 12/11/2013 0524   CALCIUM 8.5 09/02/2011 0936   ALKPHOS 101 03/08/2014 1041   ALKPHOS 59 04/08/2012 0850   ALKPHOS 72 09/02/2011 0936   AST 21 03/08/2014 1041   AST 21 04/08/2012 0850   AST 21 09/02/2011 0936   ALT 15 03/08/2014 1041   ALT 14 04/08/2012 0850   ALT 17 09/02/2011 0936   BILITOT 0.67 03/08/2014 1041   BILITOT 0.6 04/08/2012 0850   BILITOT 0.60 09/02/2011 0936     RADIOGRAPHIC STUDIES:  1. CT scan of chest, abdomen and pelvis with IV contrast on 01/01/2011 were negative for evidence of recurrent lymphoma.  2. PET scan from 08/22/2010 showed no evidence for recurrent lymphoma.  3. CT scan of chest, abdomen and pelvis with IV contrast on 01/01/2011 showed no evidence for lymphoma.  4. CT scan  of abdomen and pelvis on 07/01/2011 showed interval development of peritoneal disease worrisome for recurrence of lymphoma. There was new right external iliac lymph node pathologically enlarged and worrisome for recurrent lymphoma. There was some stable appearance of mild soft tissue stranding surrounding the celiac trunk and superior mesenteric artery.  5. Chest x-ray, 2 view, from 09/02/2011 showed no acute findings.  6. CT scan of abdomen and pelvis with IV contrast on 09/02/2011 showed interval progression of lymphadenopathy in the abdomen and pelvis. 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 The 1.1 x 1.7 cm omental nodule which was measured on the prior study of 07/01/2011 now measures 4.1 x 2.4 cm. A 2nd discrete soft tissue nodule in the omentum which previously measured 1.1 x 1.5 cm now measures 2.2 x 3.0 cm.  7. CT scan of abdomen and pelvis with IV contrast on 01/08/2012 showed no residual measurable mass along the anterior aspect of the right diaphragm at the site of the previously demonstrated 4.9 x 2.1 cm nodule. There is no residual measurable gastrohepatic or celiac lymphadenopathy. There is some soft tissue  stranding around the proximal abdominal aorta, celiac trunk and superior mesenteric artery as noted on images 27-34. Pelvic lymphadenopathy is nearly completely resolved. A right external iliac node measuring 6 mm on image 66 previously measured 9 mm. Another node which previously measured 17 mm short axis now measures 8 mm on image 70. There is no residual inguinal adenopathy. The spleen remains normal in size, demonstrates no focal abnormalities. There are no suspect osseous findings. There is no residual mass at the left posterior costophrenic angle. The final impression was near complete response to therapy in the lymphadenopathy and omental disease with some minimal residual measurable disease as previously noted. There was an enlarged left inguinal hernia containing a knuckle of sigmoid colon with   no evidence for incarceration or bowel obstruction. There was a nonobstructing left renal calculus.  8. CT scan of abdomen and pelvis with IV contrast carried out on 06/19/2012 was compared with the scan of 01/08/2012 and showed stable plaque-like soft tissue density in the upper abdominal retroperitoneum, which had the appearance of treated lymphoma. No new or progressive disease within the abdomen or pelvis was noted. There is a stable incidental finding of a small left inguinal hernia and diverticulosis.  9. Chest x-ray, 2-view, from 09/07/2012 showed low lung volumes with streaky basilar atelectasis.  10. CT scan of abdomen and pelvis with IV contrast from 11/30/2012 compared with the prior CT scans from 06/19/2012 showed no significant changes and no evidence of recurrent lymphoma or other acute findings.  11. Nuclear Myocardial scan (04/09/2013) Lexiscan Sestamibi: Clinically negative, electrically negative for ischemia. MIBI scan with evidence of a ischemia in the inferolateral wall and mild ischemia in the inferior wall. Overall region small to medium in size. LVEF on gating calculated at 54%.  12. CT scan of chest, abdomen and pelvis with IV contrast (04/08/2013) when compared to 11/30/2012 showed no evidence of lymphomatous involvement in the chest. No evidence of lymphomatous involvement in the abdomen/pelvis and mildly thick-walled bladder, correlate for cystitis. 13. CT scan of abdomen and pelvis with IV contrast (08/04/2013) showed no evidence of recurrent disease in the abdomen pelvis.  14. CT scan of chest, abdomen and pelvis with IV contrast (12/02/2013) showed no evidence of recurrent disease in the chest, abdomen and pelvis.   ASSESSMENT: Martin Morris 80 y.o. male with a history of [place] - Plan: CBC with Differential, Comprehensive metabolic panel (Cmet) - [facility], Lactate dehydrogenase (LDH) - [facility]  Edema - Plan: Lower Extremity Venous  Duplex Bilateral   PLAN:  1. Mantle cell lymphoma, now in remission.  -- CT of abdomen and pelvis as noted above on 12/02/2013 demonstrated his disease is now in remission. We balanced continuing his chemotherapy and its benefit on controlling his mantle cell lymphoma with the risk of his ongoing chest discomfort that he associates with his therapy. We reviewed his nuclear myocardial scans and EKG and cardiac cath report. We provided him a brief holiday from his therapy given his recent fall and Left hip fracture requiring a total hip replacement.  We resumed his chemotherapy on Wednesday, 02/16/2014. He voiced agreement to continuing chemotherapy in the setting of his cardiac history. He has tolerated therapy well without additional chest pain.   We will see him monthly and repeat scans every 4 months to assess for reoccurrence.   --On tomorrow, he is scheduled to receive the following:      Rituximab 375 mg/m2     Velcade 1.3 mg  per meter square,       Cytoxan 190 mg per meter square,       Decadron 20 mg IV,       He will return in 3 weeks for his next cycle (without rituximab). Previously, he has been provided handouts detailing each chemotherapy agent including each agent's indications, side effects,etc.  He understands a risk of marrow suppression and if he has a fever of 100.5 or greater to call the M.D. on call and/or report to the emergency room.  He understood these risks and benefits as detailed above and decided to continue his chemotherapy.   --We obtained a CT of abdomen pelvis on 08/04/2013 for re-staging as indicated above. Since it is stable, we started a q 3 week cycle instead of q 2 week cycle to allow him more time to recuperate between cycles and decrease risk of chest pain reoccurrence. He voiced agreement to this plan.   2. Chest pain, resolved.  -- His recent cardiac catherization (04/13/2013) revealed severe native vessel disease two of three grafts occluded. The LIMA to  the LAD has moderate graft stenosis. Preserved EF but a poor candidate for CABG. He is asymptomatic for chest pain presently.  He is handling his basic and intermediate ADLs without difficulty.    3. S/p L TH (12/09/2013)R. --He reports that he is near his baseline.  He denies pain.   4. Follow-up. RTC for chemotherapy pre-visit in 3 weeks with chemistries, complete blood count, and chemotherapy.   All questions were answered. The patient knows to call the clinic with any problems, questions or concerns. We can certainly see the patient much sooner if necessary. Was provided and after visit summary.  I spent 15 minutes counseling the patient face to face. The total time spent in the appointment was 25 minutes.    Kendle Erker, MD 03/08/2014 1:13 PM

## 2014-03-08 NOTE — Telephone Encounter (Signed)
gv and printed appt sched and avs for pt for July and Aug °

## 2014-03-09 ENCOUNTER — Ambulatory Visit (HOSPITAL_BASED_OUTPATIENT_CLINIC_OR_DEPARTMENT_OTHER): Payer: Medicare Other

## 2014-03-09 ENCOUNTER — Ambulatory Visit: Payer: Medicare Other

## 2014-03-09 VITALS — BP 167/84 | HR 82 | Temp 97.7°F | Resp 16

## 2014-03-09 DIAGNOSIS — C8313 Mantle cell lymphoma, intra-abdominal lymph nodes: Secondary | ICD-10-CM

## 2014-03-09 DIAGNOSIS — Z5112 Encounter for antineoplastic immunotherapy: Secondary | ICD-10-CM

## 2014-03-09 DIAGNOSIS — Z5111 Encounter for antineoplastic chemotherapy: Secondary | ICD-10-CM

## 2014-03-09 MED ORDER — SODIUM CHLORIDE 0.9 % IV SOLN
375.0000 mg/m2 | Freq: Once | INTRAVENOUS | Status: AC
  Administered 2014-03-09: 800 mg via INTRAVENOUS
  Filled 2014-03-09: qty 80

## 2014-03-09 MED ORDER — ACETAMINOPHEN 325 MG PO TABS
ORAL_TABLET | ORAL | Status: AC
  Filled 2014-03-09: qty 2

## 2014-03-09 MED ORDER — SODIUM CHLORIDE 0.9 % IV SOLN
190.0000 mg/m2 | Freq: Once | INTRAVENOUS | Status: AC
  Administered 2014-03-09: 400 mg via INTRAVENOUS
  Filled 2014-03-09: qty 20

## 2014-03-09 MED ORDER — DIPHENHYDRAMINE HCL 25 MG PO CAPS
ORAL_CAPSULE | ORAL | Status: AC
  Filled 2014-03-09: qty 1

## 2014-03-09 MED ORDER — DEXAMETHASONE SODIUM PHOSPHATE 4 MG/ML IJ SOLN
20.0000 mg | Freq: Once | INTRAMUSCULAR | Status: AC
  Administered 2014-03-09: 20 mg via INTRAVENOUS

## 2014-03-09 MED ORDER — ACETAMINOPHEN 325 MG PO TABS
650.0000 mg | ORAL_TABLET | Freq: Once | ORAL | Status: AC
  Administered 2014-03-09: 650 mg via ORAL

## 2014-03-09 MED ORDER — ONDANSETRON 8 MG/50ML IVPB (CHCC)
8.0000 mg | Freq: Once | INTRAVENOUS | Status: AC
  Administered 2014-03-09: 8 mg via INTRAVENOUS

## 2014-03-09 MED ORDER — BORTEZOMIB CHEMO SQ INJECTION 3.5 MG (2.5MG/ML)
1.3000 mg/m2 | Freq: Once | INTRAMUSCULAR | Status: AC
  Administered 2014-03-09: 2.75 mg via SUBCUTANEOUS
  Filled 2014-03-09: qty 2.75

## 2014-03-09 MED ORDER — HEPARIN SOD (PORK) LOCK FLUSH 100 UNIT/ML IV SOLN
500.0000 [IU] | Freq: Once | INTRAVENOUS | Status: AC | PRN
  Administered 2014-03-09: 500 [IU]
  Filled 2014-03-09: qty 5

## 2014-03-09 MED ORDER — ONDANSETRON 8 MG/NS 50 ML IVPB
INTRAVENOUS | Status: AC
  Filled 2014-03-09: qty 8

## 2014-03-09 MED ORDER — DEXAMETHASONE SODIUM PHOSPHATE 20 MG/5ML IJ SOLN
INTRAMUSCULAR | Status: AC
  Filled 2014-03-09: qty 5

## 2014-03-09 MED ORDER — SODIUM CHLORIDE 0.9 % IJ SOLN
10.0000 mL | INTRAMUSCULAR | Status: DC | PRN
  Administered 2014-03-09: 10 mL
  Filled 2014-03-09: qty 10

## 2014-03-09 MED ORDER — DIPHENHYDRAMINE HCL 25 MG PO CAPS
25.0000 mg | ORAL_CAPSULE | Freq: Once | ORAL | Status: AC
  Administered 2014-03-09: 25 mg via ORAL

## 2014-03-09 MED ORDER — SODIUM CHLORIDE 0.9 % IV SOLN
Freq: Once | INTRAVENOUS | Status: AC
  Administered 2014-03-09: 09:00:00 via INTRAVENOUS

## 2014-03-09 NOTE — Patient Instructions (Signed)
[facility] Discharge Instructions for Patients Receiving Chemotherapy  Today you received the following chemotherapy agents rituxan/cytoxan/velcade.    To help prevent nausea and vomiting after your treatment, we encourage you to take your nausea medication as directed.     If you develop nausea and vomiting that is not controlled by your nausea medication, call the clinic.   BELOW ARE SYMPTOMS THAT SHOULD BE REPORTED IMMEDIATELY:  *FEVER GREATER THAN 100.5 F  *CHILLS WITH OR WITHOUT FEVER  NAUSEA AND VOMITING THAT IS NOT CONTROLLED WITH YOUR NAUSEA MEDICATION  *UNUSUAL SHORTNESS OF BREATH  *UNUSUAL BRUISING OR BLEEDING  TENDERNESS IN MOUTH AND THROAT WITH OR WITHOUT PRESENCE OF ULCERS  *URINARY PROBLEMS  *BOWEL PROBLEMS  UNUSUAL RASH Items with * indicate a potential emergency and should be followed up as soon as possible.  Feel free to call the clinic you have any questions or concerns. The clinic phone number is [phone].

## 2014-03-23 ENCOUNTER — Telehealth: Payer: Self-pay | Admitting: Internal Medicine

## 2014-03-24 ENCOUNTER — Other Ambulatory Visit: Payer: Self-pay

## 2014-03-24 DIAGNOSIS — C8313 Mantle cell lymphoma, intra-abdominal lymph nodes: Secondary | ICD-10-CM

## 2014-03-24 MED ORDER — LIDOCAINE-PRILOCAINE 2.5-2.5 % EX CREA: 1.0000 "application " | TOPICAL_CREAM | CUTANEOUS | Status: DC

## 2014-03-24 NOTE — Telephone Encounter (Signed)
Wife called for EMLA rx. Called her back when processed.

## 2014-03-29 ENCOUNTER — Telehealth: Payer: Self-pay | Admitting: Internal Medicine

## 2014-03-29 NOTE — Telephone Encounter (Signed)
Returned a call to wife Olin Hauser wanting to know time & date for appt-stated no VM ever received-adv of appt time & date-spouse undertood

## 2014-03-30 ENCOUNTER — Ambulatory Visit (HOSPITAL_BASED_OUTPATIENT_CLINIC_OR_DEPARTMENT_OTHER): Payer: Medicare Other | Admitting: Hematology

## 2014-03-30 ENCOUNTER — Ambulatory Visit (HOSPITAL_BASED_OUTPATIENT_CLINIC_OR_DEPARTMENT_OTHER): Payer: Medicare Other

## 2014-03-30 ENCOUNTER — Other Ambulatory Visit (HOSPITAL_BASED_OUTPATIENT_CLINIC_OR_DEPARTMENT_OTHER): Payer: Medicare Other

## 2014-03-30 VITALS — BP 134/63 | HR 66 | Temp 98.8°F | Resp 18 | Ht 68.0 in | Wt 207.7 lb

## 2014-03-30 DIAGNOSIS — C8313 Mantle cell lymphoma, intra-abdominal lymph nodes: Secondary | ICD-10-CM

## 2014-03-30 DIAGNOSIS — Z5111 Encounter for antineoplastic chemotherapy: Secondary | ICD-10-CM | POA: Diagnosis not present

## 2014-03-30 DIAGNOSIS — Z5112 Encounter for antineoplastic immunotherapy: Secondary | ICD-10-CM

## 2014-03-30 DIAGNOSIS — I071 Rheumatic tricuspid insufficiency: Secondary | ICD-10-CM

## 2014-03-30 DIAGNOSIS — C8310 Mantle cell lymphoma, unspecified site: Secondary | ICD-10-CM

## 2014-03-30 LAB — COMPREHENSIVE METABOLIC PANEL (CC13)
ALK PHOS: 88 U/L (ref 40–150)
ALT: 11 U/L (ref 0–55)
AST: 19 U/L (ref 5–34)
Albumin: 3.5 g/dL (ref 3.5–5.0)
Anion Gap: 8 mEq/L (ref 3–11)
BILIRUBIN TOTAL: 0.54 mg/dL (ref 0.20–1.20)
BUN: 17.4 mg/dL (ref 7.0–26.0)
CO2: 26 mEq/L (ref 22–29)
CREATININE: 1.5 mg/dL — AB (ref 0.7–1.3)
Calcium: 8.9 mg/dL (ref 8.4–10.4)
Chloride: 107 mEq/L (ref 98–109)
GLUCOSE: 107 mg/dL (ref 70–140)
Potassium: 4.4 mEq/L (ref 3.5–5.1)
Sodium: 141 mEq/L (ref 136–145)
Total Protein: 6.1 g/dL — ABNORMAL LOW (ref 6.4–8.3)

## 2014-03-30 LAB — CBC WITH DIFFERENTIAL/PLATELET
BASO%: 3.2 % — ABNORMAL HIGH (ref 0.0–2.0)
BASOS ABS: 0.2 10*3/uL — AB (ref 0.0–0.1)
EOS ABS: 0.3 10*3/uL (ref 0.0–0.5)
EOS%: 5.5 % (ref 0.0–7.0)
HCT: 37.2 % — ABNORMAL LOW (ref 38.4–49.9)
HEMOGLOBIN: 12 g/dL — AB (ref 13.0–17.1)
LYMPH%: 23 % (ref 14.0–49.0)
MCH: 31.8 pg (ref 27.2–33.4)
MCHC: 32.1 g/dL (ref 32.0–36.0)
MCV: 98.9 fL — AB (ref 79.3–98.0)
MONO#: 0.7 10*3/uL (ref 0.1–0.9)
MONO%: 11.2 % (ref 0.0–14.0)
NEUT%: 57.1 % (ref 39.0–75.0)
NEUTROS ABS: 3.4 10*3/uL (ref 1.5–6.5)
Platelets: 190 10*3/uL (ref 140–400)
RBC: 3.76 10*6/uL — ABNORMAL LOW (ref 4.20–5.82)
RDW: 15.1 % — ABNORMAL HIGH (ref 11.0–14.6)
WBC: 5.9 10*3/uL (ref 4.0–10.3)
lymph#: 1.4 10*3/uL (ref 0.9–3.3)

## 2014-03-30 LAB — LACTATE DEHYDROGENASE (CC13): LDH: 219 U/L (ref 125–245)

## 2014-03-30 MED ORDER — HEPARIN SOD (PORK) LOCK FLUSH 100 UNIT/ML IV SOLN
500.0000 [IU] | Freq: Once | INTRAVENOUS | Status: AC | PRN
  Administered 2014-03-30: 500 [IU]
  Filled 2014-03-30: qty 5

## 2014-03-30 MED ORDER — ONDANSETRON 8 MG/NS 50 ML IVPB
INTRAVENOUS | Status: AC
  Filled 2014-03-30: qty 8

## 2014-03-30 MED ORDER — SODIUM CHLORIDE 0.9 % IV SOLN
190.0000 mg/m2 | Freq: Once | INTRAVENOUS | Status: AC
  Administered 2014-03-30: 400 mg via INTRAVENOUS
  Filled 2014-03-30: qty 20

## 2014-03-30 MED ORDER — DEXAMETHASONE SODIUM PHOSPHATE 20 MG/5ML IJ SOLN
INTRAMUSCULAR | Status: AC
  Filled 2014-03-30: qty 5

## 2014-03-30 MED ORDER — DEXAMETHASONE SODIUM PHOSPHATE 4 MG/ML IJ SOLN
20.0000 mg | Freq: Once | INTRAMUSCULAR | Status: AC
  Administered 2014-03-30: 20 mg via INTRAVENOUS

## 2014-03-30 MED ORDER — BORTEZOMIB CHEMO SQ INJECTION 3.5 MG (2.5MG/ML)
1.3000 mg/m2 | Freq: Once | INTRAMUSCULAR | Status: AC
  Administered 2014-03-30: 2.75 mg via SUBCUTANEOUS
  Filled 2014-03-30: qty 2.75

## 2014-03-30 MED ORDER — SODIUM CHLORIDE 0.9 % IV SOLN
Freq: Once | INTRAVENOUS | Status: AC
  Administered 2014-03-30: 14:00:00 via INTRAVENOUS

## 2014-03-30 MED ORDER — SODIUM CHLORIDE 0.9 % IJ SOLN
10.0000 mL | INTRAMUSCULAR | Status: DC | PRN
  Administered 2014-03-30: 10 mL
  Filled 2014-03-30: qty 10

## 2014-03-30 MED ORDER — ONDANSETRON 8 MG/50ML IVPB (CHCC)
8.0000 mg | Freq: Once | INTRAVENOUS | Status: AC
  Administered 2014-03-30: 8 mg via INTRAVENOUS

## 2014-03-30 NOTE — Patient Instructions (Signed)
[facility] Discharge Instructions for Patients Receiving Chemotherapy  Today you received the following chemotherapy agents Velcade/Cytoxan.  To help prevent nausea and vomiting after your treatment, we encourage you to take your nausea medication as prescribed.   If you develop nausea and vomiting that is not controlled by your nausea medication, call the clinic.   BELOW ARE SYMPTOMS THAT SHOULD BE REPORTED IMMEDIATELY:  *FEVER GREATER THAN 100.5 F  *CHILLS WITH OR WITHOUT FEVER  NAUSEA AND VOMITING THAT IS NOT CONTROLLED WITH YOUR NAUSEA MEDICATION  *UNUSUAL SHORTNESS OF BREATH  *UNUSUAL BRUISING OR BLEEDING  TENDERNESS IN MOUTH AND THROAT WITH OR WITHOUT PRESENCE OF ULCERS  *URINARY PROBLEMS  *BOWEL PROBLEMS  UNUSUAL RASH Items with * indicate a potential emergency and should be followed up as soon as possible.  Feel free to call the clinic you have any questions or concerns. The clinic phone number is [phone].

## 2014-04-01 ENCOUNTER — Encounter: Payer: Self-pay | Admitting: Hematology

## 2014-04-01 NOTE — Progress Notes (Signed)
[place] [address]  DIAGNOSIS: Mantle Cell Lymphoma. C76 day 1 of Velcade,Cytoxan and Decadron.  Chief Complaint  Patient presents with  . Follow-up   PRINCIPLE DIAGNOSIS: Patient is 80 years old with Mantle cell lymphoma diagnosed in March 2008 with positive bone marrow initially on 12/03/2006 and then negative bone marrow after treatment in October 2009. As stated, the patient presented with obstructive liver abnormalities requiring ERCP and non metal stent placement by Dr. Erskine Emery. The stent was subsequently removed in October 2008.   PRIOR THERAPY: The patient was treated with 8 cycles of Rituxan, Cytoxan, vincristine and Decadron in combination with Neulasta from 12/19/2006 through 05/15/2007. The patient then received maintenance Rituxan from July 16, 2007 through February 01, 2010. While on Rituxan, he had a recurrence of his disease by CT scan carried out on 01/24/2010. [name] then received treatment with bendamustine and Rituxan in combination with Neulasta for 6 cycles from 02/19/2010 through 07/26/2010. We have a prior PET scan from 08/22/2010 and CT scans of chest, abdomen, and pelvis from 01/01/2011 that showed no evidence of disease. He had been off treatment since December 2011 and had been doing well without any symptoms until the CT scan of the abdomen and pelvis on 07/01/2011 showed signs of recurrence. CT scans of abdomen and pelvis with IVC on 09/02/11 showed further progression. Rituxan, subcutaneous Velcade, intravenous Cytoxan and Decadron were initiated on 10/15/2011. CT scans of the abdomen and pelvis carried out on 01/08/2012 showed a near complete response to therapy. CT scan of the abdomen and pelvis with IV contrast carried out on 06/19/2012 showed stable plaque-like soft tissue density in the upper abdominal retroperitoneum compared with the CT scan of 01/08/2012. CT scan of  the abdomen and pelvis with IV contrast carried out on 11/30/2012 showed no evidence of recurrent lymphoma.   CURRENT THERAPY: As of 08/05/2013, treatment program will be as follows:  -Velcade 2.75 mg subcu.  -Cytoxan 400 mg IV.  -Decadron 20 mg IV.  The above drugs will be given every 3 weeks.  -Rituxan 800 mg IV every 6 weeks.  Previously Velcade, Cytoxan, and Decadron were being administered every 2 weeks, and Rituxan was being administered every 4 weeks. (from 12/04/2012 - 08/05/2013). Prior to that  beginning on 10/15/2011 - 12/04/2012, Velcade, Cytoxan, and Decadron were being administered weekly, and Rituxan was being administered every 2 weeks.  INTERIM HISTORY: Patient is in today for a followup visit prior to proceeding with his next scheduled cycle of  Rituximab, Velcade, Cytoxan and dexamethasone for treatment of his relapsed mantle cell lymphoma, originally diagnosed March 2008.    Of note, patient was celebrating his birthday in April 2013 and had an accidental fall resulting in a left hip fracture.  He underwent L THR on 12/09/2013 by Dr. Paralee Cancel.  He was discharged to rehab and completely recovered.  Prior to his fall, he had restaging scans including CT C/A/P on 04/09 which demonstrated absence of recurrent lymphoma in the chest, abdomen or pelvis.   In August of 2014, he was scheduled  for chemotherapy but had chest pain and it was held. He underwent a cardiac cath which revealed two of three native vessel ischemic disease on 04/13/2013 but he was a poor surgical candidate for a re-do CABG. His therapy was re-started in October 2014.   Overall he's tolerated his treatment without difficulty. His last therapy was 11/18/2013 due to  his fall and subsequent recovery.    Today, he feels well. States that sometimes when he is standing and bends forward notices some lower back pain  Usually a feature of spinal stenosis. He does have a significant cardiac right sided murmur and his echo  December 12 2013 does indicate a moderate Tricuspid regurgitation. Patient states he has not been told of the murmur and he had hx of cardiac bypass.  He also have some pedal edema bilaterally more so on the right side.  MEDICAL HISTORY: Past Medical History  Diagnosis Date  . Hypertension   . CAD 11/12/2007    a. s/p CABG;  b. Cath 7/11 Patent LIMA to the LAD. Previously known occlusion of a saphenous vein graft to diagonal and sequential to obtuse marginals. Severe diffuse native obtuse marginal and diagonal branch vessel disease. Preserved ejection fraction.;  c. 03/2013 Lexi CL: mild inflat and inf ischemia, EF 54%->initial med rx.  Marland Kitchen HYPERLIPIDEMIA 11/12/2007  . HYPERTENSION 11/12/2007  . PERIPHERAL VASCULAR DISEASE 11/12/2007  . ARTHRITIS 11/12/2007  . BENIGN PROSTATIC HYPERTROPHY, HX OF 11/12/2007  . Edema 10/17/2010  . INGUINAL HERNIAS, BILATERAL 11/10/2006  . Obesity, unspecified 07/05/2009  . SLEEP APNEA 11/12/2007  . History of PTCA 2005  . Osteoarthritis   . Carotid stenosis, bilateral   . Nephrolithiasis   . thymus ca dx'd 2003    Radiation comp 2003  . NHL (non-Hodgkin's lymphoma) dx'd 2008    Chemo comp 10/2010; rituxin comp 10/2010  . 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 MANTLE CELL LYMPHOMA INTRA-ABDOMINAL LYMPH NODES 11/12/2007    INTERIM HISTORY: has CANCER, THYMUS; MANTLE CELL LYMPHOMA INTRA-ABDOMINAL LYMPH NODES; HYPERLIPIDEMIA; Obesity, unspecified; HYPERTENSION; CAD; PERIPHERAL VASCULAR DISEASE; INGUINAL HERNIAS, BILATERAL; NEPHROLITHIASIS, RECURRENT; ARTHRITIS; SLEEP APNEA; BENIGN PROSTATIC HYPERTROPHY, HX OF; EDEMA; Edema; Bruising; Ulcer of lower limb, unspecified; Midsternal chest pain; Fracture of femoral neck, left; Obesity (BMI 30-39.9); and Acute blood loss anemia on his problem list.    ALLERGIES:  is allergic to atorvastatin.  MEDICATIONS: has a current medication list which includes the following prescription(s): acyclovir, amlodipine, aspirin, atenolol, citalopram,  clopidogrel, doxycycline, hydrocodone-acetaminophen, isosorbide mononitrate, lactose free nutrition, lidocaine-prilocaine, methocarbamol, nitroglycerin, pravastatin, PRESCRIPTION MEDICATION, and sulfamethoxazole-trimethoprim, and the following Facility-Administered Medications: sodium chloride.  SURGICAL HISTORY:  Past Surgical History  Procedure Laterality Date  . Coronary artery bypass graft    . Cardiac catheterization  08/2009, 02/2010  . Total hip arthroplasty Left 12/09/2013    Procedure: TOTAL HIP ARTHROPLASTY;  Surgeon: Mauri Pole, MD;  Location: [facility];  Service: Orthopedics;  Laterality: Left;   REVIEW OF SYSTEMS:   Constitutional: Denies fevers, chills or abnormal weight loss Eyes: Denies blurriness of vision Ears, nose, mouth, throat, and face: Denies mucositis or sore throat Respiratory: Denies cough, dyspnea or wheezes Cardiovascular: Denies palpitation, chest discomfort or lower extremity swelling Gastrointestinal:  Denies nausea, heartburn or change in bowel habits Skin: Denies abnormal skin rashes Lymphatics: Denies new lymphadenopathy or easy bruising Neurological:Denies numbness, tingling or new weaknesses Behavioral/Psych: Mood is stable, no new changes  All other systems were reviewed with the patient and are negative.  PHYSICAL EXAMINATION: ECOG PERFORMANCE STATUS: 0 - Asymptomatic  Blood pressure 134/63, pulse 66, temperature 98.8 F (37.1 C), temperature source Oral, resp. rate 18, height 5\' 8"  (1.727 m), weight 207 lb 11.2 oz (94.212 kg).  GENERAL:alert, no distress and comfortable;well developed and well nourished.  SKIN: skin color, texture, turgor are normal, no rashes or significant lesions EYES: normal, Conjunctiva are pink and non-injected, sclera  clear OROPHARYNX:no exudate, no erythema and lips, buccal mucosa, and tongue normal  NECK: supple, thyroid normal size, non-tender, without nodularity LYMPH:  no palpable lymphadenopathy in the cervical,  axillary or supraclavicular LUNGS: clear to auscultation and percussion with normal breathing effort HEART: regular rate & rhythm, Right sided 3/6 cardiac systolic murmur, ho hepatojugular reflux noted. ABDOMEN:abdomen soft, non-tender and normal bowel sounds Musculoskeletal:no cyanosis of digits and no clubbing  NEURO: alert & oriented x 3 with fluent speech, no focal motor/sensory deficits  Labs:  Lab Results  Component Value Date   WBC 5.9 03/30/2014   HGB 12.0* 03/30/2014   HCT 37.2* 03/30/2014   MCV 98.9* 03/30/2014   PLT 190 03/30/2014   NEUTROABS 3.4 03/30/2014      Chemistry      Component Value Date/Time   NA 141 03/30/2014 1236   NA 141 12/11/2013 0524   NA 142 09/02/2011 0936   K 4.4 03/30/2014 1236   K 4.2 12/11/2013 0524   K 4.1 09/02/2011 0936   CL 102 12/11/2013 0524   CL 106 01/29/2013 0900   CL 98 09/02/2011 0936   CO2 26 03/30/2014 1236   CO2 29 12/11/2013 0524   CO2 29 09/02/2011 0936   BUN 17.4 03/30/2014 1236   BUN 15 12/11/2013 0524   BUN 17 09/02/2011 0936   CREATININE 1.5* 03/30/2014 1236   CREATININE 1.03 12/11/2013 0524   CREATININE 0.8 09/02/2011 0936      Component Value Date/Time   CALCIUM 8.9 03/30/2014 1236   CALCIUM 7.9* 12/11/2013 0524   CALCIUM 8.5 09/02/2011 0936   ALKPHOS 88 03/30/2014 1236   ALKPHOS 59 04/08/2012 0850   ALKPHOS 72 09/02/2011 0936   AST 19 03/30/2014 1236   AST 21 04/08/2012 0850   AST 21 09/02/2011 0936   ALT 11 03/30/2014 1236   ALT 14 04/08/2012 0850   ALT 17 09/02/2011 0936   BILITOT 0.54 03/30/2014 1236   BILITOT 0.6 04/08/2012 0850   BILITOT 0.60 09/02/2011 0936     RADIOGRAPHIC STUDIES:  1. CT scan of chest, abdomen and pelvis with IV contrast on 01/01/2011 were negative for evidence of recurrent lymphoma.  2. PET scan from 08/22/2010 showed no evidence for recurrent lymphoma.  3. CT scan of chest, abdomen and pelvis with IV contrast on 01/01/2011 showed no evidence for lymphoma.  4. CT scan of abdomen and pelvis on 07/01/2011 showed interval development of  peritoneal disease worrisome for recurrence of lymphoma. There was new right external iliac lymph node pathologically enlarged and worrisome for recurrent lymphoma. There was some stable appearance of mild soft tissue stranding surrounding the celiac trunk and superior mesenteric artery.  5. Chest x-ray, 2 view, from 09/02/2011 showed no acute findings.  6. CT scan of abdomen and pelvis with IV contrast on 09/02/2011 showed interval progression of lymphadenopathy in the abdomen and pelvis. 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 The 1.1 x 1.7 cm omental nodule which was measured on the prior study of 07/01/2011 now measures 4.1 x 2.4 cm. A 2nd discrete soft tissue nodule in the omentum which previously measured 1.1 x 1.5 cm now measures 2.2 x 3.0 cm.  7. CT scan of abdomen and pelvis with IV contrast on 01/08/2012 showed no residual measurable mass along the anterior aspect of the right diaphragm at the site of the previously demonstrated 4.9 x 2.1 cm nodule. There is no residual measurable gastrohepatic or celiac lymphadenopathy. There is some soft tissue stranding around the proximal abdominal aorta, celiac  trunk and superior mesenteric artery as noted on images 27-34. Pelvic lymphadenopathy is nearly completely resolved. A right external iliac node measuring 6 mm on image 66 previously measured 9 mm. Another node which previously measured 17 mm short axis now measures 8 mm on image 70. There is no residual inguinal adenopathy. The spleen remains normal in size, demonstrates no focal abnormalities. There are no suspect osseous findings. There is no residual mass at the left posterior costophrenic angle. The final impression was near complete response to therapy in the lymphadenopathy and omental disease with some minimal residual measurable disease as previously noted. There was an enlarged left inguinal hernia containing a knuckle of sigmoid colon with  no evidence for incarceration or bowel obstruction. There was a  nonobstructing left renal calculus.  8. CT scan of abdomen and pelvis with IV contrast carried out on 06/19/2012 was compared with the scan of 01/08/2012 and showed stable plaque-like soft tissue density in the upper abdominal retroperitoneum, which had the appearance of treated lymphoma. No new or progressive disease within the abdomen or pelvis was noted. There is a stable incidental finding of a small left inguinal hernia and diverticulosis.  9. Chest x-ray, 2-view, from 09/07/2012 showed low lung volumes with streaky basilar atelectasis.  10. CT scan of abdomen and pelvis with IV contrast from 11/30/2012 compared with the prior CT scans from 06/19/2012 showed no significant changes and no evidence of recurrent lymphoma or other acute findings.  11. Nuclear Myocardial scan (04/09/2013) Lexiscan Sestamibi: Clinically negative, electrically negative for ischemia. MIBI scan with evidence of a ischemia in the inferolateral wall and mild ischemia in the inferior wall. Overall region small to medium in size. LVEF on gating calculated at 54%.  12. CT scan of chest, abdomen and pelvis with IV contrast (04/08/2013) when compared to 11/30/2012 showed no evidence of lymphomatous involvement in the chest. No evidence of lymphomatous involvement in the abdomen/pelvis and mildly thick-walled bladder, correlate for cystitis. 13. CT scan of abdomen and pelvis with IV contrast (08/04/2013) showed no evidence of recurrent disease in the abdomen pelvis.  14. CT scan of chest, abdomen and pelvis with IV contrast (12/02/2013) showed no evidence of recurrent disease in the chest, abdomen and pelvis.   ASSESSMENT: Martin Morris 80 y.o. male with a history of MCL (Mantle cell Lymphoma) since 2008 and overall he has done very well with different treatments.   PLAN:  1. Mantle cell lymphoma, now in remission.  -- CT of abdomen and pelvis as noted above on 12/02/2013 demonstrated his disease is now in remission. We  balanced continuing his chemotherapy and its benefit on controlling his mantle cell lymphoma with the risk of his ongoing chest discomfort that he associates with his therapy. We reviewed his nuclear myocardial scans and EKG and cardiac cath report. We provided him a brief holiday from his therapy given his recent fall and Left hip fracture requiring a total hip replacement.  We resumed his chemotherapy on Wednesday, 02/16/2014. He voiced agreement to continuing chemotherapy in the setting of his cardiac history. He has tolerated therapy well without additional chest pain.   We will see him monthly and repeat scans every 4 months to assess for reoccurrence.   He is scheduled to receive the following:      Rituximab 375 mg/m2 q 6 weeks not today     Velcade 1.3 mg per meter square, today      Cytoxan 190 mg per meter square, today  Decadron 20 mg IV, today      He will return in 3 weeks for his next cycle (with rituximab). Previously, he has been provided handouts detailing each chemotherapy agent including each agent's indications, side effects,etc.  He understands a risk of marrow suppression and if he has a fever of 100.5 or greater to call the M.D. on call and/or report to the emergency room.  He understood these risks and benefits as detailed above and decided to continue his chemotherapy.   --We obtained a CT of abdomen pelvis on 08/04/2013 for re-staging as indicated above. Since it is stable, we started a q 3 week cycle instead of q 2 week cycle to allow him more time to recuperate between cycles and decrease risk of chest pain reoccurrence. He voiced agreement to this plan. I am planning another scan at the end of year barring any new symptoms or change in his labs.  2. Chest pain, resolved.  -- His recent cardiac catherization (04/13/2013) revealed severe native vessel disease two of three grafts occluded. The LIMA to the LAD has moderate graft stenosis. Preserved EF but a poor candidate  for CABG. He is asymptomatic for chest pain presently.  He is handling his basic and intermediate ADLs without difficulty.    3. S/p L TH (12/09/2013)R. --He reports that he is near his baseline.  He denies pain.   4. Follow-up. RTC for chemotherapy pre-visit in 3 weeks with chemistries, complete blood count, and chemotherapy.   5. Cardiac murmur: Likely related to his TR. Clinically Not sure if that is causing some signs and symptoms of right sided CHF, no JVD, no Hepatojugular reflux but pedal edema is there more on right leg which was the site they harvested a vein for bypass. He will continue follow with his cardiologist and we can consider repeat echo if his cardiac symptoms exacerbate or get worse.  All questions were answered. The patient knows to call the clinic with any problems, questions or concerns. We can certainly see the patient much sooner if necessary. Was provided and after visit summary.  I spent 35 minutes counseling the patient face to face. The total time spent in the appointment was 40 minutes.    Bernadene Bell, MD Medical Hematologist/Oncologist [place] Pager: [phone] Office No: [phone]

## 2014-04-04 ENCOUNTER — Other Ambulatory Visit: Payer: Self-pay | Admitting: Internal Medicine

## 2014-04-05 ENCOUNTER — Telehealth: Payer: Self-pay | Admitting: Hematology

## 2014-04-05 ENCOUNTER — Telehealth: Payer: Self-pay | Admitting: *Deleted

## 2014-04-05 NOTE — Telephone Encounter (Signed)
Per [facility] staff message scheduled appts. Advised scheduler 

## 2014-04-05 NOTE — Telephone Encounter (Signed)
, °

## 2014-04-08 ENCOUNTER — Telehealth: Payer: Self-pay | Admitting: Family Medicine

## 2014-04-08 ENCOUNTER — Telehealth: Payer: Self-pay

## 2014-04-08 ENCOUNTER — Telehealth: Payer: Self-pay | Admitting: Internal Medicine

## 2014-04-08 DIAGNOSIS — C8313 Mantle cell lymphoma, intra-abdominal lymph nodes: Secondary | ICD-10-CM

## 2014-04-08 NOTE — Telephone Encounter (Signed)
Wife called earlier confirming appts. The infusion appt needs to be longer to include rituxan.  She then said: by the way Martin Morris has wounds on his legs. One about 67 weeks old and other about 48 weeks old. They are weeping clear to yellowish fluid. She needs to dress them daily. She spoke to someone who said she should call us. It was noted he does have a wound hyperbaric appt on 8/20. His next appt at [facility] is 8/26. I told her to keep the 8/20 appt. S/w Dr Juliann Mule and he said to see if the 8/20 appt could be moved to earlier. Message left for nurse on Monday to facilitate this. POF for appt sent.

## 2014-04-08 NOTE — Telephone Encounter (Signed)
PT WAS SEEING [facility]. [facility] ON [facility] 8/26 AND APPT FOR 8/26 MOVED FROM [facility] TO [facility]. S/W WIFE RE CHANGE AND NEW TIME. WIFE FORWARDED TO DESK NURSE RE [place].

## 2014-04-08 NOTE — Telephone Encounter (Signed)
Spoke with pt's wife regarding open leg wounds that pt has had x 3 weeks Pt's wife stated that pt normally sees Dr Edrick Oh but they close at 16 on Friday and pt could not be seen there until next week Informed pt's wife that she should probably call  his oncologist\ Verbalizes understanding

## 2014-04-11 ENCOUNTER — Telehealth: Payer: Self-pay | Admitting: Medical Oncology

## 2014-04-11 NOTE — Telephone Encounter (Signed)
I called [facility]  Spoke with Shanon Brow to see if we can move the pt's appointment to an earlier date. I explained that Dr. Juliann Mule would like pt to be seen earlier than 04/14/14 is possible. He states he is not able to move his appointment. He will put his name on the cancellation list and if possible work him in. He will call pt's wife if this is possible.

## 2014-04-12 ENCOUNTER — Other Ambulatory Visit: Payer: Self-pay | Admitting: Internal Medicine

## 2014-04-13 ENCOUNTER — Telehealth: Payer: Self-pay | Admitting: Internal Medicine

## 2014-04-13 NOTE — Telephone Encounter (Signed)
S/w pt advised appt 8/26 has been moved to 8/25 @ 8.30. Pt verbalized understanding.

## 2014-04-14 ENCOUNTER — Encounter (HOSPITAL_BASED_OUTPATIENT_CLINIC_OR_DEPARTMENT_OTHER): Payer: Medicare Other | Attending: Internal Medicine

## 2014-04-14 DIAGNOSIS — L97909 Non-pressure chronic ulcer of unspecified part of unspecified lower leg with unspecified severity: Secondary | ICD-10-CM | POA: Insufficient documentation

## 2014-04-14 DIAGNOSIS — I87339 Chronic venous hypertension (idiopathic) with ulcer and inflammation of unspecified lower extremity: Secondary | ICD-10-CM | POA: Insufficient documentation

## 2014-04-15 ENCOUNTER — Telehealth: Payer: Self-pay | Admitting: Cardiology

## 2014-04-15 ENCOUNTER — Other Ambulatory Visit: Payer: Self-pay

## 2014-04-15 MED ORDER — FUROSEMIDE 20 MG PO TABS: ORAL_TABLET | ORAL | Status: DC

## 2014-04-15 NOTE — Telephone Encounter (Signed)
Need a new prescription for his Lasix. Please call to [place].

## 2014-04-18 NOTE — Progress Notes (Signed)
[facility]  NAME:  DEWAUN, KINZLER                  ACCOUNT NO.:  MEDICAL RECORD NO.:  [id number]      DATE OF BIRTH:  1933/02/25  PHYSICIAN:  Ricard Dillon, M.D. VISIT DATE:  04/14/2014                                  OFFICE VISIT   Mr. Granderson arrives for our review of wounds on his bilateral legs.  He has had right greater than left leg swelling for many years, perhaps as many as 33, although he does not have prior wound issues.  Apparently roughly 6 weeks ago, he dropped a water hose on his right anterior leg and 4 weeks ago bumped into something in the garage, both of these causing wounds on the anterior leg respectively.  He has been using what sounds like a Kerlix wrap and Neosporin.  However, these have not healed and he is here for our review.  Relevant laboratory review shows a recent duplex ultrasound of his legs that does not show evidence of a DVT.  There is recent lab work in the system from February 11, 2014, that shows a BUN of 14.9 and creatinine of 1.2.  Liver function tests are normal.  Serum albumin at 3.6.  White count at 6.8, platelet count 193, hemoglobin 11.7.  He had ABIs done in December 2013, which showed a right ABI of 1.28, left of 1.23.  However, arterial Dopplers showed triphasic waves in both the posterior tibial and anterior tibial bilaterally.  PAST MEDICAL HISTORY:  Includes osteoarthritis, lymphoma in remission on ongoing chemotherapy, history of radiation, osteoarthritis, history of coronary artery disease, hypertension.  PAST SURGICAL HISTORY:  Includes hip replacement on the left, cardiac catheterization in 2001, and a cardiac bypass also in 2001.  CURRENT MEDICATIONS:  Include acyclovir 400 b.i.d., amlodipine 5 daily, ASA 81 daily, atenolol 50 daily, Celexa 20 daily, Plavix 75 daily, Imdur 120 daily, EMLA cream topically p.r.n., pravastatin 80 mg daily, Bactrim DS on Monday, Wednesday, and Friday.  Also noted on  his medications, the patient states he has Lasix 20 mg to take at home for edema, although he does not take this, although his wife says he usually tried to take it at night, perhaps this is the reason.  PHYSICAL EXAMINATION:  VITAL SIGNS:  Temperature is 98.1, pulse 60, respirations 17, blood pressure 137/70. RESPIRATORY:  Clear air entry bilaterally. CARDIAC:  Heart sounds were normal.  There was no S3.  JVP was not elevated. EXTREMITIES: There was too much edema in his bilateral legs for [place] to do ABIs in this clinic.  He did, however, have 2+ coccyx edema, significant edema of the right greater than the left leg.  There was what looks to be chronic erythema in the right lower leg compatible with a significant degree of venous stasis.  It was very difficult to feel his peripheral pulses below his popliteal, although he did have good capillary refill time.  WOUND EXAM:  There are 2 small wounds on the anterior parts of the right and left leg.  On the right side, this measured 1 x 0.3 x 0.1, on the left 1 x 0.5 x 0.1.  Both of these underwent a light nonsurgical debridement to remove the surface eschar.  Afterwards, the wound beds appeared healthy, although  there was weeping fluid drainage.  IMPRESSION/PLAN:  Traumatic wounds in the setting of significant right greater than left venous insufficiency with inflammation and ulceration. The wounds were lightly debrided.  I do not think really the wounds are going to be an issue to heal.  I think the bigger issue will be to control the degree of edema surrounding the wounds.  The presence of coccyx edema suggest systemically fluid overloaded.  I have suggested taking his 20 mg of Lasix in the morning, that way he should not have urinary frequency at night, which was the bothersome issue here.  We have dressed the wounds with silver collagen, Adaptic, and a Profore Lite wrap.  I do not think there is any evidence of PAD here.  We will see  him again in a week's time.          ______________________________ Ricard Dillon, M.D.     MGR/MEDQ  D:  04/14/2014  T:  04/14/2014  Job:  [id number]

## 2014-04-19 ENCOUNTER — Other Ambulatory Visit (HOSPITAL_BASED_OUTPATIENT_CLINIC_OR_DEPARTMENT_OTHER): Payer: Medicare Other

## 2014-04-19 ENCOUNTER — Telehealth: Payer: Self-pay | Admitting: *Deleted

## 2014-04-19 ENCOUNTER — Telehealth: Payer: Self-pay | Admitting: Internal Medicine

## 2014-04-19 ENCOUNTER — Ambulatory Visit (HOSPITAL_BASED_OUTPATIENT_CLINIC_OR_DEPARTMENT_OTHER): Payer: Medicare Other | Admitting: Internal Medicine

## 2014-04-19 ENCOUNTER — Ambulatory Visit (HOSPITAL_BASED_OUTPATIENT_CLINIC_OR_DEPARTMENT_OTHER): Payer: Medicare Other

## 2014-04-19 VITALS — BP 137/76 | HR 63 | Temp 98.0°F | Resp 19 | Ht 68.0 in | Wt 203.2 lb

## 2014-04-19 DIAGNOSIS — Z5111 Encounter for antineoplastic chemotherapy: Secondary | ICD-10-CM

## 2014-04-19 DIAGNOSIS — R609 Edema, unspecified: Secondary | ICD-10-CM

## 2014-04-19 DIAGNOSIS — C8313 Mantle cell lymphoma, intra-abdominal lymph nodes: Secondary | ICD-10-CM

## 2014-04-19 DIAGNOSIS — C8310 Mantle cell lymphoma, unspecified site: Secondary | ICD-10-CM

## 2014-04-19 DIAGNOSIS — Z5112 Encounter for antineoplastic immunotherapy: Secondary | ICD-10-CM

## 2014-04-19 LAB — CBC WITH DIFFERENTIAL/PLATELET
BASO%: 2.8 % — ABNORMAL HIGH (ref 0.0–2.0)
BASOS ABS: 0.1 10*3/uL (ref 0.0–0.1)
EOS ABS: 0.3 10*3/uL (ref 0.0–0.5)
EOS%: 6.6 % (ref 0.0–7.0)
HEMATOCRIT: 37.4 % — AB (ref 38.4–49.9)
HEMOGLOBIN: 12.2 g/dL — AB (ref 13.0–17.1)
LYMPH#: 1.3 10*3/uL (ref 0.9–3.3)
LYMPH%: 27.4 % (ref 14.0–49.0)
MCH: 32.2 pg (ref 27.2–33.4)
MCHC: 32.7 g/dL (ref 32.0–36.0)
MCV: 98.3 fL — ABNORMAL HIGH (ref 79.3–98.0)
MONO#: 0.7 10*3/uL (ref 0.1–0.9)
MONO%: 15.7 % — ABNORMAL HIGH (ref 0.0–14.0)
NEUT#: 2.2 10*3/uL (ref 1.5–6.5)
NEUT%: 47.5 % (ref 39.0–75.0)
PLATELETS: 196 10*3/uL (ref 140–400)
RBC: 3.8 10*6/uL — ABNORMAL LOW (ref 4.20–5.82)
RDW: 15 % — ABNORMAL HIGH (ref 11.0–14.6)
WBC: 4.7 10*3/uL (ref 4.0–10.3)

## 2014-04-19 LAB — COMPREHENSIVE METABOLIC PANEL (CC13)
ALT: 15 U/L (ref 0–55)
ANION GAP: 10 meq/L (ref 3–11)
AST: 22 U/L (ref 5–34)
Albumin: 3.7 g/dL (ref 3.5–5.0)
Alkaline Phosphatase: 87 U/L (ref 40–150)
BILIRUBIN TOTAL: 0.54 mg/dL (ref 0.20–1.20)
BUN: 18 mg/dL (ref 7.0–26.0)
CALCIUM: 9.1 mg/dL (ref 8.4–10.4)
CHLORIDE: 105 meq/L (ref 98–109)
CO2: 27 mEq/L (ref 22–29)
CREATININE: 1.4 mg/dL — AB (ref 0.7–1.3)
Glucose: 103 mg/dl (ref 70–140)
Potassium: 4 mEq/L (ref 3.5–5.1)
Sodium: 142 mEq/L (ref 136–145)
Total Protein: 6.3 g/dL — ABNORMAL LOW (ref 6.4–8.3)

## 2014-04-19 MED ORDER — DEXAMETHASONE SODIUM PHOSPHATE 4 MG/ML IJ SOLN
20.0000 mg | Freq: Once | INTRAMUSCULAR | Status: AC
  Administered 2014-04-19: 20 mg via INTRAVENOUS

## 2014-04-19 MED ORDER — DIPHENHYDRAMINE HCL 25 MG PO CAPS
25.0000 mg | ORAL_CAPSULE | Freq: Once | ORAL | Status: AC
  Administered 2014-04-19: 25 mg via ORAL

## 2014-04-19 MED ORDER — DEXAMETHASONE SODIUM PHOSPHATE 20 MG/5ML IJ SOLN
INTRAMUSCULAR | Status: AC
  Filled 2014-04-19: qty 5

## 2014-04-19 MED ORDER — ACETAMINOPHEN 325 MG PO TABS
650.0000 mg | ORAL_TABLET | Freq: Once | ORAL | Status: AC
  Administered 2014-04-19: 650 mg via ORAL

## 2014-04-19 MED ORDER — HEPARIN SOD (PORK) LOCK FLUSH 100 UNIT/ML IV SOLN
500.0000 [IU] | Freq: Once | INTRAVENOUS | Status: AC | PRN
  Administered 2014-04-19: 500 [IU]
  Filled 2014-04-19: qty 5

## 2014-04-19 MED ORDER — SODIUM CHLORIDE 0.9 % IJ SOLN
10.0000 mL | INTRAMUSCULAR | Status: DC | PRN
  Administered 2014-04-19: 10 mL
  Filled 2014-04-19: qty 10

## 2014-04-19 MED ORDER — ACETAMINOPHEN 325 MG PO TABS
ORAL_TABLET | ORAL | Status: AC
  Filled 2014-04-19: qty 2

## 2014-04-19 MED ORDER — SODIUM CHLORIDE 0.9 % IV SOLN
190.0000 mg/m2 | Freq: Once | INTRAVENOUS | Status: AC
  Administered 2014-04-19: 400 mg via INTRAVENOUS
  Filled 2014-04-19: qty 20

## 2014-04-19 MED ORDER — BORTEZOMIB CHEMO SQ INJECTION 3.5 MG (2.5MG/ML)
1.3000 mg/m2 | Freq: Once | INTRAMUSCULAR | Status: AC
  Administered 2014-04-19: 2.75 mg via SUBCUTANEOUS
  Filled 2014-04-19: qty 2.75

## 2014-04-19 MED ORDER — ONDANSETRON 8 MG/50ML IVPB (CHCC)
8.0000 mg | Freq: Once | INTRAVENOUS | Status: AC
  Administered 2014-04-19: 8 mg via INTRAVENOUS

## 2014-04-19 MED ORDER — DIPHENHYDRAMINE HCL 25 MG PO CAPS
ORAL_CAPSULE | ORAL | Status: AC
  Filled 2014-04-19: qty 2

## 2014-04-19 MED ORDER — SODIUM CHLORIDE 0.9 % IV SOLN
375.0000 mg/m2 | Freq: Once | INTRAVENOUS | Status: AC
  Administered 2014-04-19: 800 mg via INTRAVENOUS
  Filled 2014-04-19: qty 80

## 2014-04-19 MED ORDER — SODIUM CHLORIDE 0.9 % IV SOLN
Freq: Once | INTRAVENOUS | Status: AC
  Administered 2014-04-19: 10:00:00 via INTRAVENOUS

## 2014-04-19 MED ORDER — ONDANSETRON 8 MG/NS 50 ML IVPB
INTRAVENOUS | Status: AC
  Filled 2014-04-19: qty 8

## 2014-04-19 NOTE — Progress Notes (Signed)
[place], [place] [id number]  DIAGNOSIS: MANTLE CELL LYMPHOMA INTRA-ABDOMINAL LYMPH NODES  Chief Complaint  Patient presents with  . MANTLE CELL LYMPHOMA INTRA-ABDOMINAL LYMPH NODES   PRINCIPLE DIAGNOSIS: Mantle cell lymphoma diagnosed in March 2008 with positive bone marrow initially on 12/03/2006 and then negative bone marrow after treatment in October 2009. As stated, the patient presented with obstructive liver abnormalities requiring ERCP and non metal stent placement by Dr. Erskine Emery. The stent was subsequently removed in October 2008.   PRIOR THERAPY: The patient was treated with 8 cycles of Rituxan, Cytoxan, vincristine and Decadron in combination with Neulasta from 12/19/2006 through 05/15/2007. The patient then received maintenance Rituxan from July 16, 2007 through February 01, 2010. While on Rituxan, he had a recurrence of his disease by CT scan carried out on 01/24/2010. [name] then received treatment with bendamustine and Rituxan in combination with Neulasta for 6 cycles from 02/19/2010 through 07/26/2010. We have a prior PET scan from 08/22/2010 and CT scans of chest, abdomen, and pelvis from 01/01/2011 that showed no evidence of disease. He had been off treatment since December 2011 and had been doing well without any symptoms until the CT scan of the abdomen and pelvis on 07/01/2011 showed signs of recurrence. CT scans of abdomen and pelvis with IVC on 09/02/11 showed further progression. Rituxan, subcutaneous Velcade, intravenous Cytoxan and Decadron were initiated on 10/15/2011. CT scans of the abdomen and pelvis carried out on 01/08/2012 showed a near complete response to therapy. CT scan of the abdomen and pelvis with IV contrast carried out on 06/19/2012 showed stable plaque-like soft tissue density in the upper abdominal retroperitoneum compared with the CT scan of 01/08/2012. CT scan of the  abdomen and pelvis with IV contrast carried out on 11/30/2012 showed no evidence of recurrent lymphoma.   CURRENT THERAPY: As of 08/05/2013, treatment program will be as follows:  -Velcade 2.75 mg subcu.  -Cytoxan 400 mg IV.  -Decadron 20 mg IV.  The above drugs will be given every 3 weeks.  -Rituxan 800 mg IV every 6 weeks.  Previously Velcade, Cytoxan, and Decadron were being administered every 2 weeks, and Rituxan was being administered every 4 weeks. (from 12/04/2012 - 08/05/2013). Prior to that  beginning on 10/15/2011 - 12/04/2012, Velcade, Cytoxan, and Decadron were being administered weekly, and Rituxan was being administered every 2 weeks.  INTERIM HISTORY: Patient is in today for a followup visit prior to proceeding with his next scheduled cycle of  Rituximab, Velcade, Cytoxan and dexamethasone for treatment of his relapsed mantle cell lymphoma, originally diagnosed March 2008.    Of note, patient was celebrating his birthday in April and had an accidental fall resulting in a left hip fracture.  He underwent L THR on 12/09/2013 by Dr. Paralee Cancel.  He was discharged to rehab and now is nearly completely recovered.  Prior to his fall, he had restaging scans including CT C/A/P on 04/09 which demonstrated absence of recurrent lymphoma in the chest, abdomen or pelvis.   In August of 2014, he was scheduled  for chemotherapy but had chest pain and it was held. He underwent a cardiac cath which revealed two of three native vessel ischemic disease on 04/13/2013 but he was a poor surgical candidate for a re-do CABG. His therapy was re-started in October 2014.   Overall he's tolerated his treatment without difficulty. His last therapy was 11/18/2013 due to his fall and  subsequent recovery.    Today, he denies chest pain. He states that he has tolerated the last few cycles without chest discomfort.  His wife is accompanying him today. He reports a good appetite and his weight is stable.  He does  report starting lasix one week ago due to increase lower extremity edema complicated by weeping and lower extremity wounds that are currently being managed by wound care.  He is scheduled for a follow up appointment on 04/21/2014.     MEDICAL HISTORY: Past Medical History  Diagnosis Date  . Hypertension   . CAD 11/12/2007    a. s/p CABG;  b. Cath 7/11 Patent LIMA to the LAD. Previously known occlusion of a saphenous vein graft to diagonal and sequential to obtuse marginals. Severe diffuse native obtuse marginal and diagonal branch vessel disease. Preserved ejection fraction.;  c. 03/2013 Lexi CL: mild inflat and inf ischemia, EF 54%->initial med rx.  Marland Kitchen HYPERLIPIDEMIA 11/12/2007  . HYPERTENSION 11/12/2007  . PERIPHERAL VASCULAR DISEASE 11/12/2007  . ARTHRITIS 11/12/2007  . BENIGN PROSTATIC HYPERTROPHY, HX OF 11/12/2007  . Edema 10/17/2010  . INGUINAL HERNIAS, BILATERAL 11/10/2006  . Obesity, unspecified 07/05/2009  . SLEEP APNEA 11/12/2007  . History of PTCA 2005  . Osteoarthritis   . Carotid stenosis, bilateral   . Nephrolithiasis   . thymus ca dx'd 2003    Radiation comp 2003  . NHL (non-Hodgkin's lymphoma) dx'd 2008    Chemo comp 10/2010; rituxin comp 10/2010  . 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 MANTLE CELL LYMPHOMA INTRA-ABDOMINAL LYMPH NODES 11/12/2007  . Tricuspid valve regurgitation     INTERIM HISTORY: has CANCER, THYMUS; MANTLE CELL LYMPHOMA INTRA-ABDOMINAL LYMPH NODES; HYPERLIPIDEMIA; Obesity, unspecified; HYPERTENSION; CAD; PERIPHERAL VASCULAR DISEASE; INGUINAL HERNIAS, BILATERAL; NEPHROLITHIASIS, RECURRENT; ARTHRITIS; SLEEP APNEA; BENIGN PROSTATIC HYPERTROPHY, HX OF; EDEMA; Edema; Bruising; Ulcer of lower limb, unspecified; Midsternal chest pain; Fracture of femoral neck, left; Obesity (BMI 30-39.9); Acute blood loss anemia; and Tricuspid regurgitation on his problem list.    ALLERGIES:  is allergic to atorvastatin.  MEDICATIONS: has a current medication list which includes the following  prescription(s): acyclovir, amlodipine, aspirin, atenolol, citalopram, clopidogrel, doxycycline, furosemide, hydrocodone-acetaminophen, isosorbide mononitrate, lactose free nutrition, lidocaine-prilocaine, methocarbamol, nitroglycerin, pravastatin, PRESCRIPTION MEDICATION, and sulfamethoxazole-trimethoprim, and the following Facility-Administered Medications: sodium chloride.  SURGICAL HISTORY:  Past Surgical History  Procedure Laterality Date  . Coronary artery bypass graft    . Cardiac catheterization  08/2009, 02/2010  . Total hip arthroplasty Left 12/09/2013    Procedure: TOTAL HIP ARTHROPLASTY;  Surgeon: Mauri Pole, MD;  Location: [facility];  Service: Orthopedics;  Laterality: Left;   REVIEW OF SYSTEMS:   Constitutional: Denies fevers, chills or abnormal weight loss Eyes: Denies blurriness of vision Ears, nose, mouth, throat, and face: Denies mucositis or sore throat Respiratory: Denies cough, dyspnea or wheezes Cardiovascular: Denies palpitation, chest discomfort or lower extremity swelling Gastrointestinal:  Denies nausea, heartburn or change in bowel habits Skin: Denies abnormal skin rashes Lymphatics: Denies new lymphadenopathy or easy bruising Neurological:Denies numbness, tingling or new weaknesses Behavioral/Psych: Mood is stable, no new changes  All other systems were reviewed with the patient and are negative.  PHYSICAL EXAMINATION: ECOG PERFORMANCE STATUS: 0 - Asymptomatic  Blood pressure 137/76, pulse 63, temperature 98 F (36.7 C), temperature source Oral, resp. rate 19, height 5\' 8"  (1.727 m), weight 203 lb 3.2 oz (92.171 kg).  GENERAL:alert, no distress and comfortable;well developed and well nourished.  SKIN: skin color, texture, turgor are normal, no rashes or significant lesions EYES:  normal, Conjunctiva are pink and non-injected, sclera clear OROPHARYNX:no exudate, no erythema and lips, buccal mucosa, and tongue normal  NECK: supple, thyroid normal size,  non-tender, without nodularity LYMPH:  no palpable lymphadenopathy in the cervical, axillary or supraclavicular LUNGS: clear to auscultation and percussion with normal breathing effort HEART: regular rate & rhythm and no murmurs and bilateral extremity edema, R>L (chronic due to heart surgery ) and bandaged.  ABDOMEN:abdomen soft, non-tender and normal bowel sounds Musculoskeletal:no cyanosis of digits and no clubbing NEURO: alert & oriented x 3 with fluent speech, no focal motor/sensory deficits  Labs:  Lab Results  Component Value Date   WBC 4.7 04/19/2014   HGB 12.2* 04/19/2014   HCT 37.4* 04/19/2014   MCV 98.3* 04/19/2014   PLT 196 04/19/2014   NEUTROABS 2.2 04/19/2014      Chemistry      Component Value Date/Time   NA 141 03/30/2014 1236   NA 141 12/11/2013 0524   NA 142 09/02/2011 0936   K 4.4 03/30/2014 1236   K 4.2 12/11/2013 0524   K 4.1 09/02/2011 0936   CL 102 12/11/2013 0524   CL 106 01/29/2013 0900   CL 98 09/02/2011 0936   CO2 26 03/30/2014 1236   CO2 29 12/11/2013 0524   CO2 29 09/02/2011 0936   BUN 17.4 03/30/2014 1236   BUN 15 12/11/2013 0524   BUN 17 09/02/2011 0936   CREATININE 1.5* 03/30/2014 1236   CREATININE 1.03 12/11/2013 0524   CREATININE 0.8 09/02/2011 0936      Component Value Date/Time   CALCIUM 8.9 03/30/2014 1236   CALCIUM 7.9* 12/11/2013 0524   CALCIUM 8.5 09/02/2011 0936   ALKPHOS 88 03/30/2014 1236   ALKPHOS 59 04/08/2012 0850   ALKPHOS 72 09/02/2011 0936   AST 19 03/30/2014 1236   AST 21 04/08/2012 0850   AST 21 09/02/2011 0936   ALT 11 03/30/2014 1236   ALT 14 04/08/2012 0850   ALT 17 09/02/2011 0936   BILITOT 0.54 03/30/2014 1236   BILITOT 0.6 04/08/2012 0850   BILITOT 0.60 09/02/2011 0936     RADIOGRAPHIC STUDIES:  1. CT scan of chest, abdomen and pelvis with IV contrast on 01/01/2011 were negative for evidence of recurrent lymphoma.  2. PET scan from 08/22/2010 showed no evidence for recurrent lymphoma.  3. CT scan of chest, abdomen and pelvis with IV contrast on 01/01/2011  showed no evidence for lymphoma.  4. CT scan of abdomen and pelvis on 07/01/2011 showed interval development of peritoneal disease worrisome for recurrence of lymphoma. There was new right external iliac lymph node pathologically enlarged and worrisome for recurrent lymphoma. There was some stable appearance of mild soft tissue stranding surrounding the celiac trunk and superior mesenteric artery.  5. Chest x-ray, 2 view, from 09/02/2011 showed no acute findings.  6. CT scan of abdomen and pelvis with IV contrast on 09/02/2011 showed interval progression of lymphadenopathy in the abdomen and pelvis. 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 The 1.1 x 1.7 cm omental nodule which was measured on the prior study of 07/01/2011 now measures 4.1 x 2.4 cm. A 2nd discrete soft tissue nodule in the omentum which previously measured 1.1 x 1.5 cm now measures 2.2 x 3.0 cm.  7. CT scan of abdomen and pelvis with IV contrast on 01/08/2012 showed no residual measurable mass along the anterior aspect of the right diaphragm at the site of the previously demonstrated 4.9 x 2.1 cm nodule. There is no residual measurable gastrohepatic or celiac  lymphadenopathy. There is some soft tissue stranding around the proximal abdominal aorta, celiac trunk and superior mesenteric artery as noted on images 27-34. Pelvic lymphadenopathy is nearly completely resolved. A right external iliac node measuring 6 mm on image 66 previously measured 9 mm. Another node which previously measured 17 mm short axis now measures 8 mm on image 70. There is no residual inguinal adenopathy. The spleen remains normal in size, demonstrates no focal abnormalities. There are no suspect osseous findings. There is no residual mass at the left posterior costophrenic angle. The final impression was near complete response to therapy in the lymphadenopathy and omental disease with some minimal residual measurable disease as previously noted. There was an enlarged left inguinal  hernia containing a knuckle of sigmoid colon with  no evidence for incarceration or bowel obstruction. There was a nonobstructing left renal calculus.  8. CT scan of abdomen and pelvis with IV contrast carried out on 06/19/2012 was compared with the scan of 01/08/2012 and showed stable plaque-like soft tissue density in the upper abdominal retroperitoneum, which had the appearance of treated lymphoma. No new or progressive disease within the abdomen or pelvis was noted. There is a stable incidental finding of a small left inguinal hernia and diverticulosis.  9. Chest x-ray, 2-view, from 09/07/2012 showed low lung volumes with streaky basilar atelectasis.  10. CT scan of abdomen and pelvis with IV contrast from 11/30/2012 compared with the prior CT scans from 06/19/2012 showed no significant changes and no evidence of recurrent lymphoma or other acute findings.  11. Nuclear Myocardial scan (04/09/2013) Lexiscan Sestamibi: Clinically negative, electrically negative for ischemia. MIBI scan with evidence of a ischemia in the inferolateral wall and mild ischemia in the inferior wall. Overall region small to medium in size. LVEF on gating calculated at 54%.  12. CT scan of chest, abdomen and pelvis with IV contrast (04/08/2013) when compared to 11/30/2012 showed no evidence of lymphomatous involvement in the chest. No evidence of lymphomatous involvement in the abdomen/pelvis and mildly thick-walled bladder, correlate for cystitis. 13. CT scan of abdomen and pelvis with IV contrast (08/04/2013) showed no evidence of recurrent disease in the abdomen pelvis.  14. CT scan of chest, abdomen and pelvis with IV contrast (12/02/2013) showed no evidence of recurrent disease in the chest, abdomen and pelvis.   ASSESSMENT: Martin Morris 80 y.o. male with a history of MANTLE CELL LYMPHOMA INTRA-ABDOMINAL LYMPH NODES   PLAN:  1. Mantle cell lymphoma, now in remission.  -- CT of abdomen and pelvis as noted above on  12/02/2013 demonstrated his disease is now in remission. We balanced continuing his chemotherapy and its benefit on controlling his mantle cell lymphoma with the risk of his ongoing chest discomfort that he associates with his therapy. We reviewed his nuclear myocardial scans and EKG and cardiac cath report. We provided him a brief holiday from his therapy given his recent fall and Left hip fracture requiring a total hip replacement.  We resumed his chemotherapy on Wednesday, 02/16/2014. He voiced agreement to continuing chemotherapy in the setting of his cardiac history. He has tolerated therapy well without additional chest pain.   We will see him monthly and repeat scans every 4 months to assess for reoccurrence.   --On today, he is scheduled to receive the following:      Rituximab 375 mg/m2     Velcade 1.3 mg per meter square,       Cytoxan 190 mg per meter square,  Decadron 20 mg IV,       He will return in 3 weeks for his next cycle (without rituximab). Previously, he has been provided handouts detailing each chemotherapy agent including each agent's indications, side effects,etc.  He understands a risk of marrow suppression and if he has a fever of 100.5 or greater to call the M.D. on call and/or report to the emergency room.  He understood these risks and benefits as detailed above and decided to continue his chemotherapy.   --We obtained a CT of abdomen pelvis on 12/02/2013 for re-staging as indicated above. Since it is stable, we started a q 3 week cycle instead of q 2 week cycle to allow him more time to recuperate between cycles and decrease risk of chest pain reoccurrence. He voiced agreement to this plan.   2. Chest pain, resolved.  -- His recent cardiac catherization (04/13/2013) revealed severe native vessel disease two of three grafts occluded. The LIMA to the LAD has moderate graft stenosis. Preserved EF but a poor candidate for CABG. He is asymptomatic for chest pain presently.   He is handling his basic and intermediate ADLs without difficulty.    3. S/p L THR (12/09/2013). --He reports that he is near his baseline.  He denies pain.   4. Lower extremity edema (R greater than L) complicated by weeping and abrasions. --He reports improvement since starting lasix nearly week ago.  He has follow up with wound care on 04/21/2014.   5. Follow-up. RTC for chemotherapy pre-visit in 3 weeks with chemistries, complete blood count, and chemotherapy.   All questions were answered. The patient knows to call the clinic with any problems, questions or concerns. We can certainly see the patient much sooner if necessary. Was provided and after visit summary.  I spent 15 minutes counseling the patient face to face. The total time spent in the appointment was 25 minutes.    Anahlia Iseminger, MD 04/19/2014 9:11 AM

## 2014-04-19 NOTE — Patient Instructions (Signed)
Peripheral Edema °You have swelling in your legs (peripheral edema). This swelling is due to excess accumulation of salt and water in your body. Edema may be a sign of heart, kidney or liver disease, or a side effect of a medication. It may also be due to problems in the leg veins. Elevating your legs and using special support stockings may be very helpful, if the cause of the swelling is due to poor venous circulation. Avoid long periods of standing, whatever the cause. °Treatment of edema depends on identifying the cause. Chips, pretzels, pickles and other salty foods should be avoided. Restricting salt in your diet is almost always needed. Water pills (diuretics) are often used to remove the excess salt and water from your body via urine. These medicines prevent the kidney from reabsorbing sodium. This increases urine flow. °Diuretic treatment may also result in lowering of potassium levels in your body. Potassium supplements may be needed if you have to use diuretics daily. Daily weights can help you keep track of your progress in clearing your edema. You should call your caregiver for follow up care as recommended. °SEEK IMMEDIATE MEDICAL CARE IF:  °· You have increased swelling, pain, redness, or heat in your legs. °· You develop shortness of breath, especially when lying down. °· You develop chest or abdominal pain, weakness, or fainting. °· You have a fever. °Document Released: 09/19/2004 Document Revised: 11/04/2011 Document Reviewed: 08/30/2009 °[employer]® Patient Information ©2015 [employer]. This information is not intended to replace advice given to you by your health care provider. Make sure you discuss any questions you have with your health care provider. ° °

## 2014-04-19 NOTE — Telephone Encounter (Signed)
Per staff message and POF I have scheduled appts. Advised scheduler of appts. JMW  

## 2014-04-19 NOTE — Telephone Encounter (Signed)
Pt confirmed labs/ov per 08/25 POF, gave pt AVS....KJ °

## 2014-04-19 NOTE — Patient Instructions (Signed)
[facility] Discharge Instructions for Patients Receiving Chemotherapy  Today you received the following chemotherapy agents Rituxan, Cytoxan, Velcade  To help prevent nausea and vomiting after your treatment, we encourage you to take your nausea medication    If you develop nausea and vomiting that is not controlled by your nausea medication, call the clinic.   BELOW ARE SYMPTOMS THAT SHOULD BE REPORTED IMMEDIATELY:  *FEVER GREATER THAN 100.5 F  *CHILLS WITH OR WITHOUT FEVER  NAUSEA AND VOMITING THAT IS NOT CONTROLLED WITH YOUR NAUSEA MEDICATION  *UNUSUAL SHORTNESS OF BREATH  *UNUSUAL BRUISING OR BLEEDING  TENDERNESS IN MOUTH AND THROAT WITH OR WITHOUT PRESENCE OF ULCERS  *URINARY PROBLEMS  *BOWEL PROBLEMS  UNUSUAL RASH Items with * indicate a potential emergency and should be followed up as soon as possible.  Feel free to call the clinic you have any questions or concerns. The clinic phone number is [phone].

## 2014-04-20 ENCOUNTER — Other Ambulatory Visit: Payer: Medicare Other

## 2014-04-20 ENCOUNTER — Ambulatory Visit: Payer: Medicare Other

## 2014-04-21 DIAGNOSIS — I87339 Chronic venous hypertension (idiopathic) with ulcer and inflammation of unspecified lower extremity: Secondary | ICD-10-CM | POA: Diagnosis not present

## 2014-04-21 DIAGNOSIS — L97909 Non-pressure chronic ulcer of unspecified part of unspecified lower leg with unspecified severity: Secondary | ICD-10-CM | POA: Diagnosis not present

## 2014-04-28 ENCOUNTER — Encounter (HOSPITAL_BASED_OUTPATIENT_CLINIC_OR_DEPARTMENT_OTHER): Payer: Medicare Other | Attending: Internal Medicine

## 2014-04-28 DIAGNOSIS — I872 Venous insufficiency (chronic) (peripheral): Secondary | ICD-10-CM | POA: Diagnosis not present

## 2014-04-28 DIAGNOSIS — L97809 Non-pressure chronic ulcer of other part of unspecified lower leg with unspecified severity: Secondary | ICD-10-CM | POA: Diagnosis not present

## 2014-05-09 ENCOUNTER — Ambulatory Visit (HOSPITAL_BASED_OUTPATIENT_CLINIC_OR_DEPARTMENT_OTHER): Payer: Medicare Other | Admitting: Hematology

## 2014-05-09 ENCOUNTER — Other Ambulatory Visit (HOSPITAL_BASED_OUTPATIENT_CLINIC_OR_DEPARTMENT_OTHER): Payer: Medicare Other

## 2014-05-09 ENCOUNTER — Ambulatory Visit (HOSPITAL_BASED_OUTPATIENT_CLINIC_OR_DEPARTMENT_OTHER): Payer: Medicare Other

## 2014-05-09 ENCOUNTER — Telehealth: Payer: Self-pay | Admitting: Hematology

## 2014-05-09 VITALS — BP 139/71 | HR 68 | Temp 98.1°F | Resp 19 | Ht 68.0 in | Wt 207.8 lb

## 2014-05-09 DIAGNOSIS — C8313 Mantle cell lymphoma, intra-abdominal lymph nodes: Secondary | ICD-10-CM

## 2014-05-09 DIAGNOSIS — Z5112 Encounter for antineoplastic immunotherapy: Secondary | ICD-10-CM | POA: Diagnosis not present

## 2014-05-09 DIAGNOSIS — Z23 Encounter for immunization: Secondary | ICD-10-CM | POA: Diagnosis not present

## 2014-05-09 DIAGNOSIS — C8319 Mantle cell lymphoma, extranodal and solid organ sites: Secondary | ICD-10-CM

## 2014-05-09 DIAGNOSIS — Z5111 Encounter for antineoplastic chemotherapy: Secondary | ICD-10-CM | POA: Diagnosis not present

## 2014-05-09 LAB — CBC WITH DIFFERENTIAL/PLATELET
BASO%: 0.5 % (ref 0.0–2.0)
Basophils Absolute: 0 10*3/uL (ref 0.0–0.1)
EOS%: 6.6 % (ref 0.0–7.0)
Eosinophils Absolute: 0.4 10*3/uL (ref 0.0–0.5)
HCT: 38 % — ABNORMAL LOW (ref 38.4–49.9)
HGB: 12.4 g/dL — ABNORMAL LOW (ref 13.0–17.1)
LYMPH%: 27 % (ref 14.0–49.0)
MCH: 32.3 pg (ref 27.2–33.4)
MCHC: 32.6 g/dL (ref 32.0–36.0)
MCV: 99 fL — AB (ref 79.3–98.0)
MONO#: 0.6 10*3/uL (ref 0.1–0.9)
MONO%: 10.2 % (ref 0.0–14.0)
NEUT#: 3.1 10*3/uL (ref 1.5–6.5)
NEUT%: 55.7 % (ref 39.0–75.0)
Platelets: 182 10*3/uL (ref 140–400)
RBC: 3.84 10*6/uL — AB (ref 4.20–5.82)
RDW: 14.5 % (ref 11.0–14.6)
WBC: 5.6 10*3/uL (ref 4.0–10.3)
lymph#: 1.5 10*3/uL (ref 0.9–3.3)

## 2014-05-09 LAB — COMPREHENSIVE METABOLIC PANEL (CC13)
ALBUMIN: 3.5 g/dL (ref 3.5–5.0)
ALK PHOS: 86 U/L (ref 40–150)
ALT: 12 U/L (ref 0–55)
AST: 18 U/L (ref 5–34)
Anion Gap: 9 mEq/L (ref 3–11)
BUN: 20.5 mg/dL (ref 7.0–26.0)
CO2: 27 mEq/L (ref 22–29)
Calcium: 9 mg/dL (ref 8.4–10.4)
Chloride: 108 mEq/L (ref 98–109)
Creatinine: 1.4 mg/dL — ABNORMAL HIGH (ref 0.7–1.3)
Glucose: 118 mg/dl (ref 70–140)
POTASSIUM: 4.2 meq/L (ref 3.5–5.1)
SODIUM: 144 meq/L (ref 136–145)
Total Bilirubin: 0.54 mg/dL (ref 0.20–1.20)
Total Protein: 6.3 g/dL — ABNORMAL LOW (ref 6.4–8.3)

## 2014-05-09 LAB — LACTATE DEHYDROGENASE (CC13): LDH: 212 U/L (ref 125–245)

## 2014-05-09 MED ORDER — ONDANSETRON 8 MG/NS 50 ML IVPB
INTRAVENOUS | Status: AC
  Filled 2014-05-09: qty 8

## 2014-05-09 MED ORDER — DEXAMETHASONE SODIUM PHOSPHATE 4 MG/ML IJ SOLN
20.0000 mg | Freq: Once | INTRAMUSCULAR | Status: AC
  Administered 2014-05-09: 20 mg via INTRAVENOUS

## 2014-05-09 MED ORDER — HEPARIN SOD (PORK) LOCK FLUSH 100 UNIT/ML IV SOLN
500.0000 [IU] | Freq: Once | INTRAVENOUS | Status: AC | PRN
  Administered 2014-05-09: 500 [IU]
  Filled 2014-05-09: qty 5

## 2014-05-09 MED ORDER — BORTEZOMIB CHEMO SQ INJECTION 3.5 MG (2.5MG/ML)
1.3000 mg/m2 | Freq: Once | INTRAMUSCULAR | Status: AC
  Administered 2014-05-09: 2.75 mg via SUBCUTANEOUS
  Filled 2014-05-09: qty 2.75

## 2014-05-09 MED ORDER — SODIUM CHLORIDE 0.9 % IV SOLN
Freq: Once | INTRAVENOUS | Status: AC
  Administered 2014-05-09: 14:00:00 via INTRAVENOUS

## 2014-05-09 MED ORDER — INFLUENZA VAC SPLIT QUAD 0.5 ML IM SUSY
0.5000 mL | PREFILLED_SYRINGE | Freq: Once | INTRAMUSCULAR | Status: AC
  Administered 2014-05-09: 0.5 mL via INTRAMUSCULAR
  Filled 2014-05-09: qty 0.5

## 2014-05-09 MED ORDER — PNEUMOCOCCAL VAC POLYVALENT 25 MCG/0.5ML IJ INJ
0.5000 mL | INJECTION | Freq: Once | INTRAMUSCULAR | Status: DC
  Filled 2014-05-09: qty 0.5

## 2014-05-09 MED ORDER — DEXAMETHASONE SODIUM PHOSPHATE 20 MG/5ML IJ SOLN
INTRAMUSCULAR | Status: AC
  Filled 2014-05-09: qty 5

## 2014-05-09 MED ORDER — SODIUM CHLORIDE 0.9 % IJ SOLN
10.0000 mL | INTRAMUSCULAR | Status: DC | PRN
  Administered 2014-05-09: 10 mL
  Filled 2014-05-09: qty 10

## 2014-05-09 MED ORDER — ONDANSETRON 8 MG/50ML IVPB (CHCC)
8.0000 mg | Freq: Once | INTRAVENOUS | Status: AC
  Administered 2014-05-09: 8 mg via INTRAVENOUS

## 2014-05-09 MED ORDER — CYCLOPHOSPHAMIDE CHEMO INJECTION 1 GM
190.0000 mg/m2 | Freq: Once | INTRAMUSCULAR | Status: AC
  Administered 2014-05-09: 400 mg via INTRAVENOUS
  Filled 2014-05-09: qty 20

## 2014-05-09 NOTE — Progress Notes (Signed)
[facility] PROGRESS NOTE 05/09/14  Martin Mustache, MD [place] [id number]  DIAGNOSIS: MCL (Mantle cell Lymphoma) on maintenance chemotherapy.  PRINCIPLE DIAGNOSIS: Mantle cell lymphoma diagnosed in March 2008 with positive bone marrow initially on 12/03/2006 and then negative bone marrow after treatment in October 2009. As stated, the patient presented with obstructive liver abnormalities requiring ERCP and non metal stent placement by Dr. Erskine Emery. The stent was subsequently removed in October 2008.   PRIOR THERAPY: The patient was treated with 8 cycles of Rituxan, Cytoxan, vincristine and Decadron in combination with Neulasta from 12/19/2006 through 05/15/2007. The patient then received maintenance Rituxan from July 16, 2007 through February 01, 2010. While on Rituxan, he had a recurrence of his disease by CT scan carried out on 01/24/2010. Martin Morris then received treatment with bendamustine and Rituxan in combination with Neulasta for 6 cycles from 02/19/2010 through 07/26/2010. We have a prior PET scan from 08/22/2010 and CT scans of chest, abdomen, and pelvis from 01/01/2011 that showed no evidence of disease. He had been off treatment since December 2011 and had been doing well without any symptoms until the CT scan of the abdomen and pelvis on 07/01/2011 showed signs of recurrence. CT scans of abdomen and pelvis with IVC on 09/02/11 showed further progression. Rituxan, subcutaneous Velcade, intravenous Cytoxan and Decadron were initiated on 10/15/2011. CT scans of the abdomen and pelvis carried out on 01/08/2012 showed a near complete response to therapy. CT scan of the abdomen and pelvis with IV contrast carried out on 06/19/2012 showed stable plaque-like soft tissue density in the upper abdominal retroperitoneum compared with the CT scan of 01/08/2012. CT scan of the abdomen and pelvis with IV contrast carried out on 11/30/2012 showed no  evidence of recurrent lymphoma. Last restaging scan was in April 2015 negative for recurrence.  CURRENT THERAPY:  As of 08/05/2013, treatment program will be as follows:  -Velcade 2.75 mg subcu.  -Cytoxan 400 mg IV.  -Decadron 20 mg IV.  The above drugs will be given every 3 weeks.  -Rituxan 800 mg IV every 6 weeks.  Previously Velcade, Cytoxan, and Decadron were being administered every 2 weeks, and Rituxan was being administered every 4 weeks. (from 12/04/2012 - 08/05/2013). Prior to that  beginning on 10/15/2011 - 12/04/2012, Velcade, Cytoxan, and Decadron were being administered weekly, and Rituxan was being administered every 2 weeks.  INTERIM HISTORY: Patient is in today for a followup visit prior to proceeding with his next scheduled cycle of  Rituximab, Velcade, Cytoxan and dexamethasone for treatment of his relapsed mantle cell lymphoma, originally diagnosed March 2008.    Of note, patient was celebrating his birthday in April and had an accidental fall resulting in a left hip fracture.  He underwent L THR on 12/09/2013 by Dr. Paralee Cancel.  He was discharged to rehab and now is nearly completely recovered.  Prior to his fall, he had restaging scans including CT C/A/P on 04/09 which demonstrated absence of recurrent lymphoma in the chest, abdomen or pelvis.   In August of 2014, he was scheduled  for chemotherapy but had chest pain and it was held. He underwent a cardiac cath which revealed two of three native vessel ischemic disease on 04/13/2013 but he was a poor surgical candidate for a re-do CABG. His therapy was re-started in October 2014.   Overall he's tolerated his treatment without difficulty. His last therapy was 11/18/2013 due to his fall and subsequent recovery.  Today he gets his cycle 78 day 1 of therapy. No Riuxan today which he got 3 weeks ago.He denies any cardiac symptoms, nausea, vomiting, SOB, pedal edema or rash.    MEDICAL HISTORY: Past Medical History  Diagnosis  Date  . Hypertension   . CAD 11/12/2007    a. s/p CABG;  b. Cath 7/11 Patent LIMA to the LAD. Previously known occlusion of a saphenous vein graft to diagonal and sequential to obtuse marginals. Severe diffuse native obtuse marginal and diagonal branch vessel disease. Preserved ejection fraction.;  c. 03/2013 Lexi CL: mild inflat and inf ischemia, EF 54%->initial med rx.  Marland Kitchen HYPERLIPIDEMIA 11/12/2007  . HYPERTENSION 11/12/2007  . PERIPHERAL VASCULAR DISEASE 11/12/2007  . ARTHRITIS 11/12/2007  . BENIGN PROSTATIC HYPERTROPHY, HX OF 11/12/2007  . Edema 10/17/2010  . INGUINAL HERNIAS, BILATERAL 11/10/2006  . Obesity, unspecified 07/05/2009  . SLEEP APNEA 11/12/2007  . History of PTCA 2005  . Osteoarthritis   . Carotid stenosis, bilateral   . Nephrolithiasis   . thymus ca dx'd 2003    Radiation comp 2003  . NHL (non-Hodgkin's lymphoma) dx'd 2008    Chemo comp 10/2010; rituxin comp 10/2010  . 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 MANTLE CELL LYMPHOMA INTRA-ABDOMINAL LYMPH NODES 11/12/2007  . Tricuspid valve regurgitation     INTERIM HISTORY: has CANCER, THYMUS; MANTLE CELL LYMPHOMA INTRA-ABDOMINAL LYMPH NODES; HYPERLIPIDEMIA; Obesity, unspecified; HYPERTENSION; CAD; PERIPHERAL VASCULAR DISEASE; INGUINAL HERNIAS, BILATERAL; NEPHROLITHIASIS, RECURRENT; ARTHRITIS; SLEEP APNEA; BENIGN PROSTATIC HYPERTROPHY, HX OF; EDEMA; Edema; Bruising; Ulcer of lower limb, unspecified; Midsternal chest pain; Fracture of femoral neck, left; Obesity (BMI 30-39.9); Acute blood loss anemia; and Tricuspid regurgitation on his problem list.    ALLERGIES:  is allergic to atorvastatin.  MEDICATIONS: has a current medication list which includes the following prescription(s): acyclovir, amlodipine, aspirin, atenolol, citalopram, clopidogrel, furosemide, hydrocodone-acetaminophen, isosorbide mononitrate, lactose free nutrition, lidocaine-prilocaine, methocarbamol, nitroglycerin, pravastatin, PRESCRIPTION MEDICATION, and  sulfamethoxazole-trimethoprim, and the following Facility-Administered Medications: pneumococcal 23 valent vaccine, sodium chloride, and sodium chloride.  SURGICAL HISTORY:  Past Surgical History  Procedure Laterality Date  . Coronary artery bypass graft    . Cardiac catheterization  08/2009, 02/2010  . Total hip arthroplasty Left 12/09/2013    Procedure: TOTAL HIP ARTHROPLASTY;  Surgeon: Mauri Pole, MD;  Location: [facility];  Service: Orthopedics;  Laterality: Left;   REVIEW OF SYSTEMS:   Constitutional: Denies fevers, chills or abnormal weight loss Eyes: Denies blurriness of vision Ears, nose, mouth, throat, and face: Denies mucositis or sore throat Respiratory: Denies cough, dyspnea or wheezes Cardiovascular: Denies palpitation, chest discomfort or lower extremity swelling Gastrointestinal:  Denies nausea, heartburn or change in bowel habits Skin: Denies abnormal skin rashes Lymphatics: Denies new lymphadenopathy or easy bruising Neurological:Denies numbness, tingling or new weaknesses Behavioral/Psych: Mood is stable, no new changes  All other systems were reviewed with the patient and are negative.  PHYSICAL EXAMINATION: ECOG PERFORMANCE STATUS: 0  Blood pressure 139/71, pulse 68, temperature 98.1 F (36.7 C), temperature source Oral, resp. rate 19, height 5\' 8"  (1.727 m), weight 207 lb 12.8 oz (94.257 kg).  GENERAL:alert, no distress and comfortable;well developed and well nourished.  SKIN: skin color, texture, turgor are normal, no rashes or significant lesions EYES: normal, Conjunctiva are pink and non-injected, sclera clear OROPHARYNX:no exudate, no erythema and lips, buccal mucosa, and tongue normal  NECK: supple, thyroid normal size, non-tender, without nodularity LYMPH:  no palpable lymphadenopathy in the cervical, axillary or supraclavicular LUNGS: clear to auscultation and percussion with normal breathing  effort HEART: regular rate & rhythm and no murmurs and bilateral  extremity edema, R>L (chronic due to heart surgery ) and bandaged.  ABDOMEN:abdomen soft, non-tender and normal bowel sounds Musculoskeletal:no cyanosis of digits and no clubbing NEURO: alert & oriented x 3 with fluent speech, no focal motor/sensory deficits  Labs:  Lab Results  Component Value Date   WBC 5.6 05/09/2014   HGB 12.4* 05/09/2014   HCT 38.0* 05/09/2014   MCV 99.0* 05/09/2014   PLT 182 05/09/2014   NEUTROABS 3.1 05/09/2014      Chemistry      Component Value Date/Time   NA 144 05/09/2014 1253   NA 141 12/11/2013 0524   NA 142 09/02/2011 0936   K 4.2 05/09/2014 1253   K 4.2 12/11/2013 0524   K 4.1 09/02/2011 0936   CL 102 12/11/2013 0524   CL 106 01/29/2013 0900   CL 98 09/02/2011 0936   CO2 27 05/09/2014 1253   CO2 29 12/11/2013 0524   CO2 29 09/02/2011 0936   BUN 20.5 05/09/2014 1253   BUN 15 12/11/2013 0524   BUN 17 09/02/2011 0936   CREATININE 1.4* 05/09/2014 1253   CREATININE 1.03 12/11/2013 0524   CREATININE 0.8 09/02/2011 0936      Component Value Date/Time   CALCIUM 9.0 05/09/2014 1253   CALCIUM 7.9* 12/11/2013 0524   CALCIUM 8.5 09/02/2011 0936   ALKPHOS 86 05/09/2014 1253   ALKPHOS 59 04/08/2012 0850   ALKPHOS 72 09/02/2011 0936   AST 18 05/09/2014 1253   AST 21 04/08/2012 0850   AST 21 09/02/2011 0936   ALT 12 05/09/2014 1253   ALT 14 04/08/2012 0850   ALT 17 09/02/2011 0936   BILITOT 0.54 05/09/2014 1253   BILITOT 0.6 04/08/2012 0850   BILITOT 0.60 09/02/2011 0936              RADIOGRAPHIC STUDIES:  1. CT scan of chest, abdomen and pelvis with IV contrast on 01/01/2011 were negative for evidence of recurrent lymphoma.  2. PET scan from 08/22/2010 showed no evidence for recurrent lymphoma.  3. CT scan of chest, abdomen and pelvis with IV contrast on 01/01/2011 showed no evidence for lymphoma.  4. CT scan of abdomen and pelvis on 07/01/2011 showed interval development of peritoneal disease worrisome for recurrence of lymphoma. There was new right external iliac lymph node  pathologically enlarged and worrisome for recurrent lymphoma. There was some stable appearance of mild soft tissue stranding surrounding the celiac trunk and superior mesenteric artery.  5. Chest x-ray, 2 view, from 09/02/2011 showed no acute findings.  6. CT scan of abdomen and pelvis with IV contrast on 09/02/2011 showed interval progression of lymphadenopathy in the abdomen and pelvis. 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 The 1.1 x 1.7 cm omental nodule which was measured on the prior study of 07/01/2011 now measures 4.1 x 2.4 cm. A 2nd discrete soft tissue nodule in the omentum which previously measured 1.1 x 1.5 cm now measures 2.2 x 3.0 cm.  7. CT scan of abdomen and pelvis with IV contrast on 01/08/2012 showed no residual measurable mass along the anterior aspect of the right diaphragm at the site of the previously demonstrated 4.9 x 2.1 cm nodule. There is no residual measurable gastrohepatic or celiac lymphadenopathy. There is some soft tissue stranding around the proximal abdominal aorta, celiac trunk and superior mesenteric artery as noted on images 27-34. Pelvic lymphadenopathy is nearly completely resolved. A right external iliac node measuring 6 mm on image  66 previously measured 9 mm. Another node which previously measured 17 mm short axis now measures 8 mm on image 70. There is no residual inguinal adenopathy. The spleen remains normal in size, demonstrates no focal abnormalities. There are no suspect osseous findings. There is no residual mass at the left posterior costophrenic angle. The final impression was near complete response to therapy in the lymphadenopathy and omental disease with some minimal residual measurable disease as previously noted. There was an enlarged left inguinal hernia containing a knuckle of sigmoid colon with  no evidence for incarceration or bowel obstruction. There was a nonobstructing left renal calculus.  8. CT scan of abdomen and pelvis with IV contrast carried out on  06/19/2012 was compared with the scan of 01/08/2012 and showed stable plaque-like soft tissue density in the upper abdominal retroperitoneum, which had the appearance of treated lymphoma. No new or progressive disease within the abdomen or pelvis was noted. There is a stable incidental finding of a small left inguinal hernia and diverticulosis.  9. Chest x-ray, 2-view, from 09/07/2012 showed low lung volumes with streaky basilar atelectasis.  10. CT scan of abdomen and pelvis with IV contrast from 11/30/2012 compared with the prior CT scans from 06/19/2012 showed no significant changes and no evidence of recurrent lymphoma or other acute findings.  11. Nuclear Myocardial scan (04/09/2013) Lexiscan Sestamibi: Clinically negative, electrically negative for ischemia. MIBI scan with evidence of a ischemia in the inferolateral wall and mild ischemia in the inferior wall. Overall region small to medium in size. LVEF on gating calculated at 54%.  12. CT scan of chest, abdomen and pelvis with IV contrast (04/08/2013) when compared to 11/30/2012 showed no evidence of lymphomatous involvement in the chest. No evidence of lymphomatous involvement in the abdomen/pelvis and mildly thick-walled bladder, correlate for cystitis. 13. CT scan of abdomen and pelvis with IV contrast (08/04/2013) showed no evidence of recurrent disease in the abdomen pelvis.  14. CT scan of chest, abdomen and pelvis with IV contrast (12/02/2013) showed no evidence of recurrent disease in the chest, abdomen and pelvis.   ASSESSMENT: Martin Morris 80 y.o. male with a history of MCL on maintenece chemotherapy with Velcade, Cytoxan and Dexamethasone. He gets Rituxan every alternate cycle. His labs look fine today.   PLAN:  1. Mantle cell lymphoma, now in remission.  -- CT of abdomen and pelvis as noted above on 12/02/2013 demonstrated his disease is now in remission. We balanced continuing his chemotherapy and its benefit on controlling his  mantle cell lymphoma with the risk of his ongoing chest discomfort that he associates with his therapy. We reviewed his nuclear myocardial scans and EKG and cardiac cath report. We provided him a brief holiday from his therapy given his recent fall and Left hip fracture requiring a total hip replacement.  We resumed his chemotherapy on Wednesday, 02/16/2014. He voiced agreement to continuing chemotherapy in the setting of his cardiac history. He has tolerated therapy well without additional chest pain.   We will see him monthly and repeat scans every 4 months to assess for reoccurrence.   --Today, he is scheduled to receive the following:           Velcade 1.3 mg per meter square,       Cytoxan 190 mg per meter square,       Decadron 20 mg IV,       He will return in 3 weeks for his next cycle (with rituximab). Previously, he has been  provided handouts detailing each chemotherapy agent including each agent's indications, side effects,etc.  He understands a risk of marrow suppression and if he has a fever of 100.5 or greater to call the M.D. on call and/or report to the emergency room.  He understood these risks and benefits as detailed above and decided to continue his chemotherapy.   --We obtained a CT of abdomen pelvis on 12/02/2013 for re-staging as indicated above. Since it is stable, we started a q 3 week cycle instead of q 2 week cycle to allow him more time to recuperate between cycles and decrease risk of chest pain reoccurrence. He voiced agreement to this plan. We will plan next restaging scan in December 2015.  2. Chest pain, resolved.  -- His recent cardiac catherization (04/13/2013) revealed severe native vessel disease two of three grafts occluded. The LIMA to the LAD has moderate graft stenosis. Preserved EF but a poor candidate for CABG. He is asymptomatic for chest pain presently.  He is handling his basic and intermediate ADLs without difficulty.    3. S/p L THR (12/09/2013). --He  reports that he is near his baseline.  He denies pain.   4. Lower extremity edema (R greater than L) complicated by weeping and abrasions. --He reports improvement since starting lasix.  He has follow up with wound care on 04/21/2014.   5. He did get a flu shot and a pneumonia shot today in office.  6. Follow-up. RTC for chemotherapy pre-visit in 3 weeks with chemistries, complete blood count, and chemotherapy.   All questions were answered. The patient knows to call the clinic with any problems, questions or concerns. We can certainly see the patient much sooner if necessary. Was provided and after visit summary.  I spent 15 minutes counseling the patient face to face. The total time spent in the appointment was 25 minutes.   Bernadene Bell, MD Medical Hematologist/Oncologist [place] Pager: [phone] Office No: [phone]

## 2014-05-09 NOTE — Progress Notes (Signed)
Addendum: Patient had received pneumovax in 2013 so he did not get the vaccine today.

## 2014-05-09 NOTE — Telephone Encounter (Signed)
gv and printed appt sched and avs for pt for OCT.....sed added tx. pt wanted all tx and visit and labs on the same day

## 2014-05-09 NOTE — Patient Instructions (Signed)
[facility] Discharge Instructions for Patients Receiving Chemotherapy  Today you received the following chemotherapy agents: Velcade and Cytoxan  To help prevent nausea and vomiting after your treatment, we encourage you to take your nausea medication: as directed.   If you develop nausea and vomiting that is not controlled by your nausea medication, call the clinic.   BELOW ARE SYMPTOMS THAT SHOULD BE REPORTED IMMEDIATELY:  *FEVER GREATER THAN 100.5 F  *CHILLS WITH OR WITHOUT FEVER  NAUSEA AND VOMITING THAT IS NOT CONTROLLED WITH YOUR NAUSEA MEDICATION  *UNUSUAL SHORTNESS OF BREATH  *UNUSUAL BRUISING OR BLEEDING  TENDERNESS IN MOUTH AND THROAT WITH OR WITHOUT PRESENCE OF ULCERS  *URINARY PROBLEMS  *BOWEL PROBLEMS  UNUSUAL RASH Items with * indicate a potential emergency and should be followed up as soon as possible.  Feel free to call the clinic you have any questions or concerns. The clinic phone number is [phone].

## 2014-05-09 NOTE — Progress Notes (Signed)
Order for Pneumovax, noted pt had pneumococcal vaccine 08/2011. Unclear if MD intended pt to receive Prevnar today. MD unavailable for clarification. Will defer until next visit. Pt and wife in agreement.

## 2014-05-10 ENCOUNTER — Ambulatory Visit: Payer: Medicare Other

## 2014-05-11 ENCOUNTER — Other Ambulatory Visit: Payer: Self-pay | Admitting: Internal Medicine

## 2014-05-12 ENCOUNTER — Other Ambulatory Visit: Payer: Medicare Other

## 2014-05-12 ENCOUNTER — Ambulatory Visit: Payer: Medicare Other

## 2014-05-17 ENCOUNTER — Other Ambulatory Visit: Payer: Self-pay | Admitting: *Deleted

## 2014-05-17 DIAGNOSIS — C8319 Mantle cell lymphoma, extranodal and solid organ sites: Secondary | ICD-10-CM

## 2014-05-17 MED ORDER — CITALOPRAM HYDROBROMIDE 20 MG PO TABS: ORAL_TABLET | ORAL | Status: DC

## 2014-05-17 MED ORDER — ACYCLOVIR 400 MG PO TABS: ORAL_TABLET | ORAL | Status: DC

## 2014-05-18 ENCOUNTER — Telehealth: Payer: Self-pay | Admitting: *Deleted

## 2014-05-18 NOTE — Telephone Encounter (Signed)
Patient's wife called reporting [facility] has not received a response from our office for Acyclovir 400 mg and Citalopram 20 mg.  EMR shows order sent eRx yesterday and was received at 1110 am.  Verified correct pharmacy name.  Called [facility] [phone]) at 1330.  Order being processed at this time.  "We had inventory and this threw off orders."

## 2014-05-30 ENCOUNTER — Ambulatory Visit (HOSPITAL_BASED_OUTPATIENT_CLINIC_OR_DEPARTMENT_OTHER): Payer: Medicare Other | Admitting: Physician Assistant

## 2014-05-30 ENCOUNTER — Other Ambulatory Visit: Payer: Self-pay | Admitting: Hematology

## 2014-05-30 ENCOUNTER — Encounter: Payer: Self-pay | Admitting: Physician Assistant

## 2014-05-30 ENCOUNTER — Other Ambulatory Visit (HOSPITAL_BASED_OUTPATIENT_CLINIC_OR_DEPARTMENT_OTHER): Payer: Medicare Other

## 2014-05-30 ENCOUNTER — Telehealth: Payer: Self-pay | Admitting: Physician Assistant

## 2014-05-30 ENCOUNTER — Ambulatory Visit (HOSPITAL_BASED_OUTPATIENT_CLINIC_OR_DEPARTMENT_OTHER): Payer: Medicare Other

## 2014-05-30 VITALS — BP 139/72 | HR 63 | Temp 97.6°F | Resp 18 | Ht 68.0 in | Wt 209.2 lb

## 2014-05-30 DIAGNOSIS — R609 Edema, unspecified: Secondary | ICD-10-CM

## 2014-05-30 DIAGNOSIS — C8313 Mantle cell lymphoma, intra-abdominal lymph nodes: Secondary | ICD-10-CM

## 2014-05-30 DIAGNOSIS — Z5112 Encounter for antineoplastic immunotherapy: Secondary | ICD-10-CM | POA: Diagnosis not present

## 2014-05-30 DIAGNOSIS — C8319 Mantle cell lymphoma, extranodal and solid organ sites: Secondary | ICD-10-CM

## 2014-05-30 DIAGNOSIS — Z5111 Encounter for antineoplastic chemotherapy: Secondary | ICD-10-CM

## 2014-05-30 LAB — CBC WITH DIFFERENTIAL/PLATELET
BASO%: 0.8 % (ref 0.0–2.0)
BASOS ABS: 0 10*3/uL (ref 0.0–0.1)
EOS ABS: 0.3 10*3/uL (ref 0.0–0.5)
EOS%: 5.8 % (ref 0.0–7.0)
HEMATOCRIT: 35.4 % — AB (ref 38.4–49.9)
HEMOGLOBIN: 11.5 g/dL — AB (ref 13.0–17.1)
LYMPH%: 26.6 % (ref 14.0–49.0)
MCH: 32.1 pg (ref 27.2–33.4)
MCHC: 32.5 g/dL (ref 32.0–36.0)
MCV: 98.9 fL — AB (ref 79.3–98.0)
MONO#: 0.6 10*3/uL (ref 0.1–0.9)
MONO%: 12.7 % (ref 0.0–14.0)
NEUT%: 54.1 % (ref 39.0–75.0)
NEUTROS ABS: 2.6 10*3/uL (ref 1.5–6.5)
Platelets: 197 10*3/uL (ref 140–400)
RBC: 3.58 10*6/uL — ABNORMAL LOW (ref 4.20–5.82)
RDW: 14.1 % (ref 11.0–14.6)
WBC: 4.8 10*3/uL (ref 4.0–10.3)
lymph#: 1.3 10*3/uL (ref 0.9–3.3)
nRBC: 0 % (ref 0–0)

## 2014-05-30 LAB — COMPREHENSIVE METABOLIC PANEL (CC13)
ALBUMIN: 3.4 g/dL — AB (ref 3.5–5.0)
ALT: 14 U/L (ref 0–55)
ANION GAP: 8 meq/L (ref 3–11)
AST: 17 U/L (ref 5–34)
Alkaline Phosphatase: 78 U/L (ref 40–150)
BUN: 15.3 mg/dL (ref 7.0–26.0)
CALCIUM: 9.1 mg/dL (ref 8.4–10.4)
CO2: 26 meq/L (ref 22–29)
Chloride: 110 mEq/L — ABNORMAL HIGH (ref 98–109)
Creatinine: 1.2 mg/dL (ref 0.7–1.3)
GLUCOSE: 99 mg/dL (ref 70–140)
POTASSIUM: 4.7 meq/L (ref 3.5–5.1)
Sodium: 144 mEq/L (ref 136–145)
TOTAL PROTEIN: 6 g/dL — AB (ref 6.4–8.3)
Total Bilirubin: 0.64 mg/dL (ref 0.20–1.20)

## 2014-05-30 LAB — LACTATE DEHYDROGENASE (CC13): LDH: 207 U/L (ref 125–245)

## 2014-05-30 MED ORDER — HEPARIN SOD (PORK) LOCK FLUSH 100 UNIT/ML IV SOLN
500.0000 [IU] | Freq: Once | INTRAVENOUS | Status: AC | PRN
  Administered 2014-05-30: 500 [IU]
  Filled 2014-05-30: qty 5

## 2014-05-30 MED ORDER — ONDANSETRON 8 MG/50ML IVPB (CHCC)
8.0000 mg | Freq: Once | INTRAVENOUS | Status: AC
  Administered 2014-05-30: 8 mg via INTRAVENOUS

## 2014-05-30 MED ORDER — SODIUM CHLORIDE 0.9 % IV SOLN
190.0000 mg/m2 | Freq: Once | INTRAVENOUS | Status: AC
  Administered 2014-05-30: 400 mg via INTRAVENOUS
  Filled 2014-05-30: qty 20

## 2014-05-30 MED ORDER — SODIUM CHLORIDE 0.9 % IV SOLN
375.0000 mg/m2 | Freq: Once | INTRAVENOUS | Status: AC
  Administered 2014-05-30: 800 mg via INTRAVENOUS
  Filled 2014-05-30: qty 80

## 2014-05-30 MED ORDER — SODIUM CHLORIDE 0.9 % IV SOLN
Freq: Once | INTRAVENOUS | Status: AC
  Administered 2014-05-30: 10:00:00 via INTRAVENOUS

## 2014-05-30 MED ORDER — SODIUM CHLORIDE 0.9 % IJ SOLN
10.0000 mL | INTRAMUSCULAR | Status: DC | PRN
  Administered 2014-05-30: 10 mL
  Filled 2014-05-30: qty 10

## 2014-05-30 MED ORDER — DIPHENHYDRAMINE HCL 25 MG PO CAPS
25.0000 mg | ORAL_CAPSULE | Freq: Once | ORAL | Status: AC
  Administered 2014-05-30: 25 mg via ORAL

## 2014-05-30 MED ORDER — ACETAMINOPHEN 325 MG PO TABS
650.0000 mg | ORAL_TABLET | Freq: Once | ORAL | Status: AC
  Administered 2014-05-30: 650 mg via ORAL

## 2014-05-30 MED ORDER — ONDANSETRON 8 MG/NS 50 ML IVPB
INTRAVENOUS | Status: AC
  Filled 2014-05-30: qty 8

## 2014-05-30 MED ORDER — ACETAMINOPHEN 325 MG PO TABS
ORAL_TABLET | ORAL | Status: AC
  Filled 2014-05-30: qty 2

## 2014-05-30 MED ORDER — DEXAMETHASONE SODIUM PHOSPHATE 4 MG/ML IJ SOLN
20.0000 mg | Freq: Once | INTRAMUSCULAR | Status: AC
  Administered 2014-05-30: 20 mg via INTRAVENOUS

## 2014-05-30 MED ORDER — DIPHENHYDRAMINE HCL 25 MG PO CAPS
ORAL_CAPSULE | ORAL | Status: AC
  Filled 2014-05-30: qty 1

## 2014-05-30 MED ORDER — BORTEZOMIB CHEMO SQ INJECTION 3.5 MG (2.5MG/ML)
1.3000 mg/m2 | Freq: Once | INTRAMUSCULAR | Status: AC
  Administered 2014-05-30: 2.75 mg via SUBCUTANEOUS
  Filled 2014-05-30: qty 2.75

## 2014-05-30 MED ORDER — DEXAMETHASONE SODIUM PHOSPHATE 20 MG/5ML IJ SOLN
INTRAMUSCULAR | Status: AC
  Filled 2014-05-30: qty 5

## 2014-05-30 NOTE — Telephone Encounter (Signed)
Pt confirmed labs/ov per 10/05 POF, gave pt AVS..... KJ °

## 2014-05-30 NOTE — Progress Notes (Signed)
[facility] PROGRESS NOTE 05/09/14  [name], MD [place] [address]  DIAGNOSIS: MCL (Mantle cell Lymphoma) on maintenance chemotherapy.  PRINCIPLE DIAGNOSIS: Mantle cell lymphoma diagnosed in March 2008 with positive bone marrow initially on 12/03/2006 and then negative bone marrow after treatment in October 2009. As stated, the patient presented with obstructive liver abnormalities requiring ERCP and non metal stent placement by Dr. Erskine Emery. The stent was subsequently removed in October 2008.   PRIOR THERAPY: The patient was treated with 8 cycles of Rituxan, Cytoxan, vincristine and Decadron in combination with Neulasta from 12/19/2006 through 05/15/2007. The patient then received maintenance Rituxan from July 16, 2007 through February 01, 2010. While on Rituxan, he had a recurrence of his disease by CT scan carried out on 01/24/2010. Mr. Ashmead then received treatment with bendamustine and Rituxan in combination with Neulasta for 6 cycles from 02/19/2010 through 07/26/2010. We have a prior PET scan from 08/22/2010 and CT scans of chest, abdomen, and pelvis from 01/01/2011 that showed no evidence of disease. He had been off treatment since December 2011 and had been doing well without any symptoms until the CT scan of the abdomen and pelvis on 07/01/2011 showed signs of recurrence. CT scans of abdomen and pelvis with IVC on 09/02/11 showed further progression. Rituxan, subcutaneous Velcade, intravenous Cytoxan and Decadron were initiated on 10/15/2011. CT scans of the abdomen and pelvis carried out on 01/08/2012 showed a near complete response to therapy. CT scan of the abdomen and pelvis with IV contrast carried out on 06/19/2012 showed stable plaque-like soft tissue density in the upper abdominal retroperitoneum compared with the CT scan of 01/08/2012. CT scan of the abdomen and pelvis with IV contrast carried out on 11/30/2012 showed no  evidence of recurrent lymphoma. Last restaging scan was in April 2015 negative for recurrence.  CURRENT THERAPY:  As of 08/05/2013, treatment program will be as follows:  -Velcade 2.75 mg subcu.  -Cytoxan 400 mg IV.  -Decadron 20 mg IV.  The above drugs will be given every 3 weeks.  -Rituxan 800 mg IV every 6 weeks.  Previously Velcade, Cytoxan, and Decadron were being administered every 2 weeks, and Rituxan was being administered every 4 weeks. (from 12/04/2012 - 08/05/2013). Prior to that  beginning on 10/15/2011 - 12/04/2012, Velcade, Cytoxan, and Decadron were being administered weekly, and Rituxan was being administered every 2 weeks.  INTERIM HISTORY: Patient is in today for a followup visit prior to proceeding with his next scheduled cycle of  Rituximab, Velcade, Cytoxan and dexamethasone for treatment of his relapsed mantle cell lymphoma, originally diagnosed March 2008.    Since his last office visit on 05/09/2014 he's been doing well. He continues to have bilateral lower extremity edema, a little more so on the right than on the left but this is overall stable. He is tolerating his chemotherapy well. He voices no specific complaints today. He's had no further falls and denies any recent episodes of chest pain. Of note patient received Pneumovax vaccine in 2013. He denies any cardiac symptoms, nausea, vomiting, dyspnea, or rash.   He presents to proceed with cycle #79 day 1 of therapy. He will receive Rituxan with this cycle if it has been 6 weeks since his previous Rituxan therapy.    MEDICAL HISTORY: Past Medical History  Diagnosis Date  . Hypertension   . CAD 11/12/2007    a. s/p CABG;  b. Cath 7/11 Patent LIMA to the LAD. Previously known  occlusion of a saphenous vein graft to diagonal and sequential to obtuse marginals. Severe diffuse native obtuse marginal and diagonal branch vessel disease. Preserved ejection fraction.;  c. 03/2013 Lexi CL: mild inflat and inf ischemia, EF  54%->initial med rx.  Marland Kitchen HYPERLIPIDEMIA 11/12/2007  . HYPERTENSION 11/12/2007  . PERIPHERAL VASCULAR DISEASE 11/12/2007  . ARTHRITIS 11/12/2007  . BENIGN PROSTATIC HYPERTROPHY, HX OF 11/12/2007  . Edema 10/17/2010  . INGUINAL HERNIAS, BILATERAL 11/10/2006  . Obesity, unspecified 07/05/2009  . SLEEP APNEA 11/12/2007  . History of PTCA 2005  . Osteoarthritis   . Carotid stenosis, bilateral   . Nephrolithiasis   . thymus ca dx'd 2003    Radiation comp 2003  . NHL (non-Hodgkin's lymphoma) dx'd 2008    Chemo comp 10/2010; rituxin comp 10/2010  . 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 MANTLE CELL LYMPHOMA INTRA-ABDOMINAL LYMPH NODES 11/12/2007  . Tricuspid valve regurgitation     INTERIM HISTORY: has CANCER, THYMUS; Mantle cell lymphoma of intra-abdominal lymph nodes; HYPERLIPIDEMIA; Obesity, unspecified; HYPERTENSION; CAD; PERIPHERAL VASCULAR DISEASE; INGUINAL HERNIAS, BILATERAL; NEPHROLITHIASIS, RECURRENT; ARTHRITIS; SLEEP APNEA; BENIGN PROSTATIC HYPERTROPHY, HX OF; EDEMA; Edema; Bruising; Ulcer of lower limb, unspecified; Midsternal chest pain; Fracture of femoral neck, left; Obesity (BMI 30-39.9); Acute blood loss anemia; and Tricuspid regurgitation on his problem list.    ALLERGIES:  is allergic to atorvastatin.  MEDICATIONS: has a current medication list which includes the following prescription(s): acyclovir, amlodipine, aspirin, atenolol, citalopram, clopidogrel, furosemide, hydrocodone-acetaminophen, isosorbide mononitrate, lactose free nutrition, lidocaine-prilocaine, methocarbamol, nitroglycerin, pravastatin, PRESCRIPTION MEDICATION, and sulfamethoxazole-trimethoprim, and the following Facility-Administered Medications: sodium chloride and sodium chloride.  SURGICAL HISTORY:  Past Surgical History  Procedure Laterality Date  . Coronary artery bypass graft    . Cardiac catheterization  08/2009, 02/2010  . Total hip arthroplasty Left 12/09/2013    Procedure: TOTAL HIP ARTHROPLASTY;  Surgeon: Mauri Pole, MD;  Location: [facility];  Service: Orthopedics;  Laterality: Left;   REVIEW OF SYSTEMS:   Constitutional: Denies fevers, chills or abnormal weight loss Eyes: Denies blurriness of vision Ears, nose, mouth, throat, and face: Denies mucositis or sore throat Respiratory: Denies cough, dyspnea or wheezes Cardiovascular: Denies palpitation, chest discomfort or lower extremity swelling Gastrointestinal:  Denies nausea, heartburn or change in bowel habits Skin: Denies abnormal skin rashes Lymphatics: Denies new lymphadenopathy or easy bruising Neurological:Denies numbness, tingling or new weaknesses Behavioral/Psych: Mood is stable, no new changes  All other systems were reviewed with the patient and are negative.  PHYSICAL EXAMINATION: ECOG PERFORMANCE STATUS: 0  Blood pressure 139/72, pulse 63, temperature 97.6 F (36.4 C), temperature source Oral, resp. rate 18, height 5\' 8"  (1.727 m), weight 209 lb 3.2 oz (94.892 kg).  GENERAL:alert, no distress and comfortable;well developed and well nourished.  SKIN: skin color, texture, turgor are normal, no rashes or significant lesions EYES: normal, Conjunctiva are pink and non-injected, sclera clear OROPHARYNX:no exudate, no erythema and lips, buccal mucosa, and tongue normal  NECK: supple, thyroid normal size, non-tender, without nodularity LYMPH:  no palpable lymphadenopathy in the cervical, axillary or supraclavicular LUNGS: clear to auscultation and percussion with normal breathing effort HEART: regular rate & rhythm and no murmurs and bilateral extremity edema, R>L (chronic due to heart surgery ) and bandaged.  ABDOMEN:abdomen soft, non-tender and normal bowel sounds Musculoskeletal:no cyanosis of digits and no clubbing. 1+ to 2+ bilateral lower extremity edema greater on the right than on the left NEURO: alert & oriented x 3 with fluent speech, no focal motor/sensory deficits  Labs:  Lab  Results  Component Value Date   WBC 4.8  05/30/2014   HGB 11.5* 05/30/2014   HCT 35.4* 05/30/2014   MCV 98.9* 05/30/2014   PLT 197 05/30/2014   NEUTROABS 2.6 05/30/2014      Chemistry      Component Value Date/Time   NA 144 05/30/2014 0919   NA 141 12/11/2013 0524   NA 142 09/02/2011 0936   K 4.7 05/30/2014 0919   K 4.2 12/11/2013 0524   K 4.1 09/02/2011 0936   CL 102 12/11/2013 0524   CL 106 01/29/2013 0900   CL 98 09/02/2011 0936   CO2 26 05/30/2014 0919   CO2 29 12/11/2013 0524   CO2 29 09/02/2011 0936   BUN 15.3 05/30/2014 0919   BUN 15 12/11/2013 0524   BUN 17 09/02/2011 0936   CREATININE 1.2 05/30/2014 0919   CREATININE 1.03 12/11/2013 0524   CREATININE 0.8 09/02/2011 0936      Component Value Date/Time   CALCIUM 9.1 05/30/2014 0919   CALCIUM 7.9* 12/11/2013 0524   CALCIUM 8.5 09/02/2011 0936   ALKPHOS 78 05/30/2014 0919   ALKPHOS 59 04/08/2012 0850   ALKPHOS 72 09/02/2011 0936   AST 17 05/30/2014 0919   AST 21 04/08/2012 0850   AST 21 09/02/2011 0936   ALT 14 05/30/2014 0919   ALT 14 04/08/2012 0850   ALT 17 09/02/2011 0936   BILITOT 0.64 05/30/2014 0919   BILITOT 0.6 04/08/2012 0850   BILITOT 0.60 09/02/2011 0936       RADIOGRAPHIC STUDIES:  1. CT scan of chest, abdomen and pelvis with IV contrast on 01/01/2011 were negative for evidence of recurrent lymphoma.  2. PET scan from 08/22/2010 showed no evidence for recurrent lymphoma.  3. CT scan of chest, abdomen and pelvis with IV contrast on 01/01/2011 showed no evidence for lymphoma.  4. CT scan of abdomen and pelvis on 07/01/2011 showed interval development of peritoneal disease worrisome for recurrence of lymphoma. There was new right external iliac lymph node pathologically enlarged and worrisome for recurrent lymphoma. There was some stable appearance of mild soft tissue stranding surrounding the celiac trunk and superior mesenteric artery.  5. Chest x-ray, 2 view, from 09/02/2011 showed no acute findings.  6. CT scan of abdomen and pelvis with IV contrast on 09/02/2011 showed interval  progression of lymphadenopathy in the abdomen and pelvis. 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 The 1.1 x 1.7 cm omental nodule which was measured on the prior study of 07/01/2011 now measures 4.1 x 2.4 cm. A 2nd discrete soft tissue nodule in the omentum which previously measured 1.1 x 1.5 cm now measures 2.2 x 3.0 cm.  7. CT scan of abdomen and pelvis with IV contrast on 01/08/2012 showed no residual measurable mass along the anterior aspect of the right diaphragm at the site of the previously demonstrated 4.9 x 2.1 cm nodule. There is no residual measurable gastrohepatic or celiac lymphadenopathy. There is some soft tissue stranding around the proximal abdominal aorta, celiac trunk and superior mesenteric artery as noted on images 27-34. Pelvic lymphadenopathy is nearly completely resolved. A right external iliac node measuring 6 mm on image 66 previously measured 9 mm. Another node which previously measured 17 mm short axis now measures 8 mm on image 70. There is no residual inguinal adenopathy. The spleen remains normal in size, demonstrates no focal abnormalities. There are no suspect osseous findings. There is no residual mass at the left posterior costophrenic angle. The final impression was near complete response  to therapy in the lymphadenopathy and omental disease with some minimal residual measurable disease as previously noted. There was an enlarged left inguinal hernia containing a knuckle of sigmoid colon with  no evidence for incarceration or bowel obstruction. There was a nonobstructing left renal calculus.  8. CT scan of abdomen and pelvis with IV contrast carried out on 06/19/2012 was compared with the scan of 01/08/2012 and showed stable plaque-like soft tissue density in the upper abdominal retroperitoneum, which had the appearance of treated lymphoma. No new or progressive disease within the abdomen or pelvis was noted. There is a stable incidental finding of a small left inguinal hernia and  diverticulosis.  9. Chest x-ray, 2-view, from 09/07/2012 showed low lung volumes with streaky basilar atelectasis.  10. CT scan of abdomen and pelvis with IV contrast from 11/30/2012 compared with the prior CT scans from 06/19/2012 showed no significant changes and no evidence of recurrent lymphoma or other acute findings.  11. Nuclear Myocardial scan (04/09/2013) Lexiscan Sestamibi: Clinically negative, electrically negative for ischemia. MIBI scan with evidence of a ischemia in the inferolateral wall and mild ischemia in the inferior wall. Overall region small to medium in size. LVEF on gating calculated at 54%.  12. CT scan of chest, abdomen and pelvis with IV contrast (04/08/2013) when compared to 11/30/2012 showed no evidence of lymphomatous involvement in the chest. No evidence of lymphomatous involvement in the abdomen/pelvis and mildly thick-walled bladder, correlate for cystitis. 13. CT scan of abdomen and pelvis with IV contrast (08/04/2013) showed no evidence of recurrent disease in the abdomen pelvis.  14. CT scan of chest, abdomen and pelvis with IV contrast (12/02/2013) showed no evidence of recurrent disease in the chest, abdomen and pelvis.   ASSESSMENT: DIA JEFFERYS 80 y.o. male with a history of MCL on maintenece chemotherapy with Velcade, Cytoxan and Dexamethasone. He gets Rituxan every alternate cycle. His labs have been reviewed and are within physical range he'll proceed with chemotherapy today as scheduled.    PLAN:  1. Mantle cell lymphoma, now in remission.  -- CT of abdomen and pelvis as noted above on 12/02/2013 demonstrated his disease is now in remission. We balanced continuing his chemotherapy and its benefit on controlling his mantle cell lymphoma with the risk of his ongoing chest discomfort that he associates with his therapy. We reviewed his nuclear myocardial scans and EKG and cardiac cath report. We provided him a brief holiday from his therapy given his recent  fall and Left hip fracture requiring a total hip replacement.  We resumed his chemotherapy on Wednesday, 02/16/2014. He voiced agreement to continuing chemotherapy in the setting of his cardiac history. He has tolerated therapy well without additional chest pain.   We will see him monthly and repeat scans every 4 months to assess for reoccurrence.   --Today, he is scheduled to receive the following:           Velcade 1.3 mg per meter square,       Cytoxan 190 mg per meter square,       Decadron 20 mg IV, and Rituxan 800 mg IV       He will return in 3 weeks for his next cycle (without rituximab). Previously, he has been provided handouts detailing each chemotherapy agent including each agent's indications, side effects,etc.  He understands a risk of marrow suppression and if he has a fever of 100.5 or greater to call the M.D. on call and/or report to the emergency room.  He understood these risks and benefits as detailed above and decided to continue his chemotherapy.   --We obtained a CT of abdomen pelvis on 12/02/2013 for re-staging as indicated above. Since it is stable, we started a q 3 week cycle instead of q 2 week cycle to allow him more time to recuperate between cycles and decrease risk of chest pain reoccurrence. We will plan next restaging scan in December 2015.  2. Chest pain, resolved.  -- His recent cardiac catherization (04/13/2013) revealed severe native vessel disease two of three grafts occluded. The LIMA to the LAD has moderate graft stenosis. Preserved EF but a poor candidate for CABG. He is asymptomatic for chest pain presently.  He is handling his basic and intermediate ADLs without difficulty.    3. S/p L THR (12/09/2013). --He reports that he is near his baseline.  He denies pain.   4. Lower extremity edema (R greater than L) complicated by weeping and abrasions. --He reports improvement and stablility since starting lasix.   5. He did get a flu shot and a pneumonia shot  today in office.  6. Follow-up. RTC for chemotherapy pre-visit in 3 weeks with chemistries, complete blood count, and chemotherapy.   All questions were answered. The patient knows to call the clinic with any problems, questions or concerns. We can certainly see the patient much sooner if necessary. Was provided and after visit summary.  Patient was reviewed with Dr. Lona Kettle.  Wynetta Emery, Jacara Benito E, PA-C

## 2014-05-30 NOTE — Patient Instructions (Signed)
[facility] Discharge Instructions for Patients Receiving Chemotherapy  Today you received the following chemotherapy agents Cytoxan, Velcade and Rituxan  To help prevent nausea and vomiting after your treatment, we encourage you to take your nausea medication as prescribed.   If you develop nausea and vomiting that is not controlled by your nausea medication, call the clinic.   BELOW ARE SYMPTOMS THAT SHOULD BE REPORTED IMMEDIATELY:  *FEVER GREATER THAN 100.5 F  *CHILLS WITH OR WITHOUT FEVER  NAUSEA AND VOMITING THAT IS NOT CONTROLLED WITH YOUR NAUSEA MEDICATION  *UNUSUAL SHORTNESS OF BREATH  *UNUSUAL BRUISING OR BLEEDING  TENDERNESS IN MOUTH AND THROAT WITH OR WITHOUT PRESENCE OF ULCERS  *URINARY PROBLEMS  *BOWEL PROBLEMS  UNUSUAL RASH Items with * indicate a potential emergency and should be followed up as soon as possible.  Feel free to call the clinic you have any questions or concerns. The clinic phone number is [phone].

## 2014-05-31 ENCOUNTER — Ambulatory Visit: Payer: Medicare Other

## 2014-06-02 NOTE — Patient Instructions (Signed)
Follow-up in 3 weeks

## 2014-06-06 ENCOUNTER — Other Ambulatory Visit: Payer: Self-pay | Admitting: Hematology

## 2014-06-08 ENCOUNTER — Telehealth: Payer: Self-pay | Admitting: *Deleted

## 2014-06-08 NOTE — Telephone Encounter (Signed)
MD notified that we need new orders

## 2014-06-08 NOTE — Telephone Encounter (Signed)
Late entry----Per staff message I have tried to scchedule treatments. No care plan/new orders in computer. I have advised scheduler and  desk RN multiple times

## 2014-06-09 ENCOUNTER — Other Ambulatory Visit: Payer: Self-pay | Admitting: Hematology

## 2014-06-09 ENCOUNTER — Other Ambulatory Visit: Payer: Self-pay | Admitting: Physician Assistant

## 2014-06-10 ENCOUNTER — Telehealth: Payer: Self-pay | Admitting: *Deleted

## 2014-06-10 NOTE — Telephone Encounter (Signed)
Per staff message and POF I have scheduled appts. Advised scheduler of appts. JMW  

## 2014-06-13 ENCOUNTER — Other Ambulatory Visit: Payer: Self-pay | Admitting: Hematology

## 2014-06-20 ENCOUNTER — Telehealth: Payer: Self-pay | Admitting: Hematology

## 2014-06-20 ENCOUNTER — Ambulatory Visit (HOSPITAL_BASED_OUTPATIENT_CLINIC_OR_DEPARTMENT_OTHER): Payer: Medicare Other

## 2014-06-20 ENCOUNTER — Ambulatory Visit (HOSPITAL_BASED_OUTPATIENT_CLINIC_OR_DEPARTMENT_OTHER): Payer: Medicare Other | Admitting: Hematology

## 2014-06-20 ENCOUNTER — Other Ambulatory Visit (HOSPITAL_BASED_OUTPATIENT_CLINIC_OR_DEPARTMENT_OTHER): Payer: Medicare Other

## 2014-06-20 ENCOUNTER — Telehealth: Payer: Self-pay | Admitting: *Deleted

## 2014-06-20 ENCOUNTER — Other Ambulatory Visit: Payer: Self-pay

## 2014-06-20 VITALS — BP 143/76 | HR 64 | Temp 97.8°F | Resp 18 | Ht 68.0 in | Wt 209.0 lb

## 2014-06-20 DIAGNOSIS — R600 Localized edema: Secondary | ICD-10-CM | POA: Diagnosis not present

## 2014-06-20 DIAGNOSIS — Z5112 Encounter for antineoplastic immunotherapy: Secondary | ICD-10-CM

## 2014-06-20 DIAGNOSIS — C8313 Mantle cell lymphoma, intra-abdominal lymph nodes: Secondary | ICD-10-CM | POA: Diagnosis not present

## 2014-06-20 DIAGNOSIS — Z5111 Encounter for antineoplastic chemotherapy: Secondary | ICD-10-CM

## 2014-06-20 LAB — CBC WITH DIFFERENTIAL/PLATELET
BASO%: 2 % (ref 0.0–2.0)
BASOS ABS: 0.1 10*3/uL (ref 0.0–0.1)
EOS%: 6.5 % (ref 0.0–7.0)
Eosinophils Absolute: 0.4 10*3/uL (ref 0.0–0.5)
HEMATOCRIT: 36.6 % — AB (ref 38.4–49.9)
HEMOGLOBIN: 11.9 g/dL — AB (ref 13.0–17.1)
LYMPH%: 24.4 % (ref 14.0–49.0)
MCH: 32.9 pg (ref 27.2–33.4)
MCHC: 32.6 g/dL (ref 32.0–36.0)
MCV: 100.9 fL — ABNORMAL HIGH (ref 79.3–98.0)
MONO#: 0.8 10*3/uL (ref 0.1–0.9)
MONO%: 15 % — AB (ref 0.0–14.0)
NEUT#: 2.9 10*3/uL (ref 1.5–6.5)
NEUT%: 52.1 % (ref 39.0–75.0)
Platelets: 200 10*3/uL (ref 140–400)
RBC: 3.63 10*6/uL — ABNORMAL LOW (ref 4.20–5.82)
RDW: 14.7 % — ABNORMAL HIGH (ref 11.0–14.6)
WBC: 5.6 10*3/uL (ref 4.0–10.3)
lymph#: 1.4 10*3/uL (ref 0.9–3.3)

## 2014-06-20 LAB — COMPREHENSIVE METABOLIC PANEL (CC13)
ALK PHOS: 86 U/L (ref 40–150)
ALT: 14 U/L (ref 0–55)
AST: 18 U/L (ref 5–34)
Albumin: 3.6 g/dL (ref 3.5–5.0)
Anion Gap: 8 mEq/L (ref 3–11)
BUN: 13.6 mg/dL (ref 7.0–26.0)
CALCIUM: 9.2 mg/dL (ref 8.4–10.4)
CHLORIDE: 108 meq/L (ref 98–109)
CO2: 25 mEq/L (ref 22–29)
CREATININE: 1.2 mg/dL (ref 0.7–1.3)
Glucose: 97 mg/dl (ref 70–140)
Potassium: 4.1 mEq/L (ref 3.5–5.1)
Sodium: 141 mEq/L (ref 136–145)
Total Bilirubin: 0.83 mg/dL (ref 0.20–1.20)
Total Protein: 6 g/dL — ABNORMAL LOW (ref 6.4–8.3)

## 2014-06-20 LAB — LACTATE DEHYDROGENASE (CC13): LDH: 222 U/L (ref 125–245)

## 2014-06-20 MED ORDER — HEPARIN SOD (PORK) LOCK FLUSH 100 UNIT/ML IV SOLN
500.0000 [IU] | Freq: Once | INTRAVENOUS | Status: AC | PRN
  Administered 2014-06-20: 500 [IU]
  Filled 2014-06-20: qty 5

## 2014-06-20 MED ORDER — ACYCLOVIR 400 MG PO TABS: ORAL_TABLET | ORAL | Status: DC

## 2014-06-20 MED ORDER — DEXAMETHASONE SODIUM PHOSPHATE 4 MG/ML IJ SOLN
20.0000 mg | Freq: Once | INTRAMUSCULAR | Status: AC
  Administered 2014-06-20: 20 mg via INTRAVENOUS

## 2014-06-20 MED ORDER — SODIUM CHLORIDE 0.9 % IV SOLN
Freq: Once | INTRAVENOUS | Status: AC
  Administered 2014-06-20: 11:00:00 via INTRAVENOUS

## 2014-06-20 MED ORDER — ONDANSETRON 8 MG/50ML IVPB (CHCC)
8.0000 mg | Freq: Once | INTRAVENOUS | Status: AC
  Administered 2014-06-20: 8 mg via INTRAVENOUS

## 2014-06-20 MED ORDER — BORTEZOMIB CHEMO SQ INJECTION 3.5 MG (2.5MG/ML)
1.3000 mg/m2 | Freq: Once | INTRAMUSCULAR | Status: AC
  Administered 2014-06-20: 2.75 mg via SUBCUTANEOUS
  Filled 2014-06-20: qty 2.75

## 2014-06-20 MED ORDER — SODIUM CHLORIDE 0.9 % IJ SOLN
10.0000 mL | INTRAMUSCULAR | Status: DC | PRN
  Administered 2014-06-20: 10 mL
  Filled 2014-06-20: qty 10

## 2014-06-20 MED ORDER — SODIUM CHLORIDE 0.9 % IV SOLN
190.0000 mg/m2 | Freq: Once | INTRAVENOUS | Status: AC
  Administered 2014-06-20: 400 mg via INTRAVENOUS
  Filled 2014-06-20: qty 20

## 2014-06-20 MED ORDER — CITALOPRAM HYDROBROMIDE 20 MG PO TABS: ORAL_TABLET | ORAL | Status: DC

## 2014-06-20 MED ORDER — ONDANSETRON 8 MG/NS 50 ML IVPB
INTRAVENOUS | Status: AC
  Filled 2014-06-20: qty 8

## 2014-06-20 MED ORDER — DEXAMETHASONE SODIUM PHOSPHATE 20 MG/5ML IJ SOLN
INTRAMUSCULAR | Status: AC
  Filled 2014-06-20: qty 5

## 2014-06-20 NOTE — Telephone Encounter (Signed)
Pt confirmed labs/ov per 10/26 POF, gave pt AVS.... KJ °

## 2014-06-20 NOTE — Patient Instructions (Signed)
[facility] Discharge Instructions for Patients Receiving Chemotherapy  Today you received the following chemotherapy agents Velcade/Cytoxan.   To help prevent nausea and vomiting after your treatment, we encourage you to take your nausea medication as directed.    If you develop nausea and vomiting that is not controlled by your nausea medication, call the clinic.   BELOW ARE SYMPTOMS THAT SHOULD BE REPORTED IMMEDIATELY:  *FEVER GREATER THAN 100.5 F  *CHILLS WITH OR WITHOUT FEVER  NAUSEA AND VOMITING THAT IS NOT CONTROLLED WITH YOUR NAUSEA MEDICATION  *UNUSUAL SHORTNESS OF BREATH  *UNUSUAL BRUISING OR BLEEDING  TENDERNESS IN MOUTH AND THROAT WITH OR WITHOUT PRESENCE OF ULCERS  *URINARY PROBLEMS  *BOWEL PROBLEMS  UNUSUAL RASH Items with * indicate a potential emergency and should be followed up as soon as possible.  Feel free to call the clinic you have any questions or concerns. The clinic phone number is [phone].

## 2014-06-20 NOTE — Progress Notes (Signed)
[name] PROGRESS NOTE DATE OF VISIT: 06/20/2014  Martin Mustache, MD [place] [id number]  DIAGNOSIS: MCL (Mantle cell Lymphoma) on maintenance chemotherapy.  PRINCIPLE DIAGNOSIS: Mantle cell lymphoma diagnosed in March 2008 with positive bone marrow initially on 12/03/2006 and then negative bone marrow after treatment in October 2009. As stated, the patient presented with obstructive liver abnormalities requiring ERCP and non metal stent placement by Dr. Erskine Emery. The stent was subsequently removed in October 2008.   PRIOR THERAPY: The patient was treated with 8 cycles of Rituxan, Cytoxan, vincristine and Decadron in combination with Neulasta from 12/19/2006 through 05/15/2007. The patient then received maintenance Rituxan from July 16, 2007 through February 01, 2010. While on Rituxan, he had a recurrence of his disease by CT scan carried out on 01/24/2010. Mr. Willcutt then received treatment with bendamustine and Rituxan in combination with Neulasta for 6 cycles from 02/19/2010 through 07/26/2010. We have a prior PET scan from 08/22/2010 and CT scans of chest, abdomen, and pelvis from 01/01/2011 that showed no evidence of disease. He had been off treatment since December 2011 and had been doing well without any symptoms until the CT scan of the abdomen and pelvis on 07/01/2011 showed signs of recurrence. CT scans of abdomen and pelvis with IVC on 09/02/11 showed further progression. Rituxan, subcutaneous Velcade, intravenous Cytoxan and Decadron were initiated on 10/15/2011. CT scans of the abdomen and pelvis carried out on 01/08/2012 showed a near complete response to therapy. CT scan of the abdomen and pelvis with IV contrast carried out on 06/19/2012 showed stable plaque-like soft tissue density in the upper abdominal retroperitoneum compared with the CT scan of 01/08/2012. CT scan of the abdomen and pelvis with IV contrast carried out on  11/30/2012 showed no evidence of recurrent lymphoma. Last restaging scan was in April 2015 negative for recurrence.  CURRENT THERAPY:  As of 08/05/2013, treatment program will be as follows:  -Velcade 2.75 mg subcu.  -Cytoxan 400 mg IV.  -Decadron 20 mg IV.  The above drugs will be given every 3 weeks.  -Rituxan 800 mg IV every 6 weeks.  Previously Velcade, Cytoxan, and Decadron were being administered every 2 weeks, and Rituxan was being administered every 4 weeks. (from 12/04/2012 - 08/05/2013). Prior to that  beginning on 10/15/2011 - 12/04/2012, Velcade, Cytoxan, and Decadron were being administered weekly, and Rituxan was being administered every 2 weeks.  INTERIM HISTORY: Patient is in today for a followup visit prior to proceeding with his next scheduled cycle of  Rituximab, Velcade, Cytoxan and dexamethasone for treatment of his relapsed mantle cell lymphoma, originally diagnosed March 2008.    Since his last office visit on 05/09/2014 he's been doing well. He continues to have bilateral lower extremity edema, a little more so on the right than on the left but this is overall stable. He is tolerating his chemotherapy well. He voices no specific complaints today. He's had no further falls and denies any recent episodes of chest pain. Of note patient received Pneumovax vaccine in 2013. He denies any cardiac symptoms, nausea, vomiting, dyspnea, or rash. He got his flu shot this year. His renal function today is normal and albumin is back to normal a marker of nutrition.   He presents to proceed with cycle #80 day 1 of therapy. He will receive Rituxan with this cycle if it has been 6 weeks since his previous Rituxan therapy.    MEDICAL HISTORY: Past Medical History  Diagnosis  Date  . Hypertension   . CAD 11/12/2007    a. s/p CABG;  b. Cath 7/11 Patent LIMA to the LAD. Previously known occlusion of a saphenous vein graft to diagonal and sequential to obtuse marginals. Severe diffuse native  obtuse marginal and diagonal branch vessel disease. Preserved ejection fraction.;  c. 03/2013 Lexi CL: mild inflat and inf ischemia, EF 54%->initial med rx.  Marland Kitchen HYPERLIPIDEMIA 11/12/2007  . HYPERTENSION 11/12/2007  . PERIPHERAL VASCULAR DISEASE 11/12/2007  . ARTHRITIS 11/12/2007  . BENIGN PROSTATIC HYPERTROPHY, HX OF 11/12/2007  . Edema 10/17/2010  . INGUINAL HERNIAS, BILATERAL 11/10/2006  . Obesity, unspecified 07/05/2009  . SLEEP APNEA 11/12/2007  . History of PTCA 2005  . Osteoarthritis   . Carotid stenosis, bilateral   . Nephrolithiasis   . thymus ca dx'd 2003    Radiation comp 2003  . NHL (non-Hodgkin's lymphoma) dx'd 2008    Chemo comp 10/2010; rituxin comp 10/2010  . 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 MANTLE CELL LYMPHOMA INTRA-ABDOMINAL LYMPH NODES 11/12/2007  . Tricuspid valve regurgitation     INTERIM HISTORY: has CANCER, THYMUS; Mantle cell lymphoma of intra-abdominal lymph nodes; HYPERLIPIDEMIA; Obesity, unspecified; HYPERTENSION; CAD; PERIPHERAL VASCULAR DISEASE; INGUINAL HERNIAS, BILATERAL; NEPHROLITHIASIS, RECURRENT; ARTHRITIS; SLEEP APNEA; BENIGN PROSTATIC HYPERTROPHY, HX OF; EDEMA; Edema; Bruising; Ulcer of lower limb, unspecified; Midsternal chest pain; Fracture of femoral neck, left; Obesity (BMI 30-39.9); Acute blood loss anemia; and Tricuspid regurgitation on his problem list.    ALLERGIES:  is allergic to atorvastatin.  MEDICATIONS: has a current medication list which includes the following prescription(s): amlodipine, aspirin, atenolol, clopidogrel, furosemide, hydrocodone-acetaminophen, isosorbide mononitrate, lactose free nutrition, lidocaine-prilocaine, methocarbamol, nitroglycerin, pravastatin, PRESCRIPTION MEDICATION, sulfamethoxazole-trimethoprim, acyclovir, and citalopram, and the following Facility-Administered Medications: sodium chloride.  SURGICAL HISTORY:  Past Surgical History  Procedure Laterality Date  . Coronary artery bypass graft    . Cardiac catheterization   08/2009, 02/2010  . Total hip arthroplasty Left 12/09/2013    Procedure: TOTAL HIP ARTHROPLASTY;  Surgeon: Mauri Pole, MD;  Location: [facility];  Service: Orthopedics;  Laterality: Left;   REVIEW OF SYSTEMS:   Constitutional: Denies fevers, chills or abnormal weight loss Eyes: Denies blurriness of vision Ears, nose, mouth, throat, and face: Denies mucositis or sore throat Respiratory: Denies cough, dyspnea or wheezes Cardiovascular: Denies palpitation, chest discomfort or lower extremity swelling Gastrointestinal:  Denies nausea, heartburn or change in bowel habits Skin: Denies abnormal skin rashes Lymphatics: Denies new lymphadenopathy or easy bruising Neurological:Denies numbness, tingling or new weaknesses Behavioral/Psych: Mood is stable, no new changes  All other systems were reviewed with the patient and are negative.  PHYSICAL EXAMINATION: ECOG PERFORMANCE STATUS: 0  Blood pressure 143/76, pulse 64, temperature 97.8 F (36.6 C), temperature source Oral, resp. rate 18, height 5\' 8"  (1.727 m), weight 209 lb (94.802 kg), SpO2 99.00%.  GENERAL:alert, no distress and comfortable;well developed and well nourished.  SKIN: skin color, texture, turgor are normal, no rashes or significant lesions EYES: normal, Conjunctiva are pink and non-injected, sclera clear OROPHARYNX:no exudate, no erythema and lips, buccal mucosa, and tongue normal  NECK: supple, thyroid normal size, non-tender, without nodularity LYMPH:  no palpable lymphadenopathy in the cervical, axillary or supraclavicular LUNGS: clear to auscultation and percussion with normal breathing effort HEART: regular rate & rhythm and no murmurs and bilateral extremity edema, R>L (chronic due to heart surgery ) and bandaged.  ABDOMEN:abdomen soft, non-tender and normal bowel sounds Musculoskeletal:no cyanosis of digits and no clubbing. 1+ to 2+ bilateral lower extremity edema greater on  the right than on the left NEURO: alert & oriented  x 3 with fluent speech, no focal motor/sensory deficits  Labs:  Lab Results  Component Value Date   WBC 5.6 06/20/2014   HGB 11.9* 06/20/2014   HCT 36.6* 06/20/2014   MCV 100.9* 06/20/2014   PLT 200 06/20/2014   NEUTROABS 2.9 06/20/2014      Chemistry      Component Value Date/Time   NA 141 06/20/2014 0946   NA 141 12/11/2013 0524   NA 142 09/02/2011 0936   K 4.1 06/20/2014 0946   K 4.2 12/11/2013 0524   K 4.1 09/02/2011 0936   CL 102 12/11/2013 0524   CL 106 01/29/2013 0900   CL 98 09/02/2011 0936   CO2 25 06/20/2014 0946   CO2 29 12/11/2013 0524   CO2 29 09/02/2011 0936   BUN 13.6 06/20/2014 0946   BUN 15 12/11/2013 0524   BUN 17 09/02/2011 0936   CREATININE 1.2 06/20/2014 0946   CREATININE 1.03 12/11/2013 0524   CREATININE 0.8 09/02/2011 0936      Component Value Date/Time   CALCIUM 9.2 06/20/2014 0946   CALCIUM 7.9* 12/11/2013 0524   CALCIUM 8.5 09/02/2011 0936   ALKPHOS 86 06/20/2014 0946   ALKPHOS 59 04/08/2012 0850   ALKPHOS 72 09/02/2011 0936   AST 18 06/20/2014 0946   AST 21 04/08/2012 0850   AST 21 09/02/2011 0936   ALT 14 06/20/2014 0946   ALT 14 04/08/2012 0850   ALT 17 09/02/2011 0936   BILITOT 0.83 06/20/2014 0946   BILITOT 0.6 04/08/2012 0850   BILITOT 0.60 09/02/2011 0936       RADIOGRAPHIC STUDIES:  1. CT scan of chest, abdomen and pelvis with IV contrast on 01/01/2011 were negative for evidence of recurrent lymphoma.  2. PET scan from 08/22/2010 showed no evidence for recurrent lymphoma.  3. CT scan of chest, abdomen and pelvis with IV contrast on 01/01/2011 showed no evidence for lymphoma.  4. CT scan of abdomen and pelvis on 07/01/2011 showed interval development of peritoneal disease worrisome for recurrence of lymphoma. There was new right external iliac lymph node pathologically enlarged and worrisome for recurrent lymphoma. There was some stable appearance of mild soft tissue stranding surrounding the celiac trunk and superior mesenteric artery.  5. Chest x-ray, 2  view, from 09/02/2011 showed no acute findings.  6. CT scan of abdomen and pelvis with IV contrast on 09/02/2011 showed interval progression of lymphadenopathy in the abdomen and pelvis. 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 The 1.1 x 1.7 cm omental nodule which was measured on the prior study of 07/01/2011 now measures 4.1 x 2.4 cm. A 2nd discrete soft tissue nodule in the omentum which previously measured 1.1 x 1.5 cm now measures 2.2 x 3.0 cm.  7. CT scan of abdomen and pelvis with IV contrast on 01/08/2012 showed no residual measurable mass along the anterior aspect of the right diaphragm at the site of the previously demonstrated 4.9 x 2.1 cm nodule. There is no residual measurable gastrohepatic or celiac lymphadenopathy. There is some soft tissue stranding around the proximal abdominal aorta, celiac trunk and superior mesenteric artery as noted on images 27-34. Pelvic lymphadenopathy is nearly completely resolved. A right external iliac node measuring 6 mm on image 66 previously measured 9 mm. Another node which previously measured 17 mm short axis now measures 8 mm on image 70. There is no residual inguinal adenopathy. The spleen remains normal in size, demonstrates no focal abnormalities. There  are no suspect osseous findings. There is no residual mass at the left posterior costophrenic angle. The final impression was near complete response to therapy in the lymphadenopathy and omental disease with some minimal residual measurable disease as previously noted. There was an enlarged left inguinal hernia containing a knuckle of sigmoid colon with  no evidence for incarceration or bowel obstruction. There was a nonobstructing left renal calculus.  8. CT scan of abdomen and pelvis with IV contrast carried out on 06/19/2012 was compared with the scan of 01/08/2012 and showed stable plaque-like soft tissue density in the upper abdominal retroperitoneum, which had the appearance of treated lymphoma. No new or progressive  disease within the abdomen or pelvis was noted. There is a stable incidental finding of a small left inguinal hernia and diverticulosis.  9. Chest x-ray, 2-view, from 09/07/2012 showed low lung volumes with streaky basilar atelectasis.  10. CT scan of abdomen and pelvis with IV contrast from 11/30/2012 compared with the prior CT scans from 06/19/2012 showed no significant changes and no evidence of recurrent lymphoma or other acute findings.  11. Nuclear Myocardial scan (04/09/2013) Lexiscan Sestamibi: Clinically negative, electrically negative for ischemia. MIBI scan with evidence of a ischemia in the inferolateral wall and mild ischemia in the inferior wall. Overall region small to medium in size. LVEF on gating calculated at 54%.  12. CT scan of chest, abdomen and pelvis with IV contrast (04/08/2013) when compared to 11/30/2012 showed no evidence of lymphomatous involvement in the chest. No evidence of lymphomatous involvement in the abdomen/pelvis and mildly thick-walled bladder, correlate for cystitis. 13. CT scan of abdomen and pelvis with IV contrast (08/04/2013) showed no evidence of recurrent disease in the abdomen pelvis.  14. CT scan of chest, abdomen and pelvis with IV contrast (12/02/2013) showed no evidence of recurrent disease in the chest, abdomen and pelvis.   ASSESSMENT: Martin Morris 80 y.o. male with a history of MCL on maintenece chemotherapy with Velcade, Cytoxan and Dexamethasone. He gets Rituxan every alternate cycle. His labs have been reviewed and are within physical range he'll proceed with chemotherapy today as scheduled.    PLAN:  1. Mantle cell lymphoma, now in remission.  -- CT of abdomen and pelvis as noted above on 12/02/2013 demonstrated his disease is now in remission. We balanced continuing his chemotherapy and its benefit on controlling his mantle cell lymphoma with the risk of his ongoing chest discomfort that he associates with his therapy. We reviewed his  nuclear myocardial scans and EKG and cardiac cath report. We provided him a brief holiday from his therapy given his recent fall and Left hip fracture requiring a total hip replacement.  We resumed his chemotherapy on Wednesday, 02/16/2014. He voiced agreement to continuing chemotherapy in the setting of his cardiac history. He has tolerated therapy well without additional chest pain.   We will see him monthly and repeat scans every 4 months to assess for reoccurrence.   --Today, he is scheduled to receive the following:           Velcade 1.3 mg per meter square,       Cytoxan 190 mg per meter square,       Decadron 20 mg IV, and Rituxan 800 mg IV       He will return in 3 weeks for his next cycle (with rituximab). Previously, he has been provided handouts detailing each chemotherapy agent including each agent's indications, side effects,etc.  He understands a risk of marrow suppression  and if he has a fever of 100.5 or greater to call the M.D. on call and/or report to the emergency room.  He understood these risks and benefits as detailed above and decided to continue his chemotherapy. I reviewed his labs and examined him and approved his chemotherapy today.  --We obtained a CT of abdomen pelvis on 12/02/2013 for re-staging as indicated above. Since it is stable, we started a q 3 week cycle instead of q 2 week cycle to allow him more time to recuperate between cycles and decrease risk of chest pain reoccurrence. We will plan next restaging scan in next year around April 2015 unless we have new onset symptoms or lab abnormalities.  2. Chest pain, resolved.  -- His recent cardiac catherization (04/13/2013) revealed severe native vessel disease two of three grafts occluded. The LIMA to the LAD has moderate graft stenosis. Preserved EF but a poor candidate for CABG. He is asymptomatic for chest pain presently.  He is handling his basic and intermediate ADLs without difficulty.    3. S/p L THR  (12/09/2013). --He reports that he is near his baseline.  He denies pain.   4. Lower extremity edema (R greater than L) complicated by weeping and abrasions. --He reports improvement and stablility since starting lasix.   5. He did get a flu shot and a pneumonia shot in office few weeks ago.  6. Follow-up. RTC for chemotherapy pre-visit in 3 weeks with chemistries, complete blood count, and chemotherapy.   All questions were answered. The patient knows to call the clinic with any problems, questions or concerns. We can certainly see the patient much sooner if necessary. Was provided and after visit summary.    Bernadene Bell, MD Medical Hematologist/Oncologist [place] Pager: [phone] Office No: [phone]

## 2014-06-20 NOTE — Telephone Encounter (Signed)
Per staff message and POF I have scheduled appts. Advised scheduler of appts. JMW  

## 2014-07-11 ENCOUNTER — Encounter: Payer: Self-pay | Admitting: Physician Assistant

## 2014-07-11 ENCOUNTER — Other Ambulatory Visit: Payer: Self-pay | Admitting: Hematology and Oncology

## 2014-07-11 ENCOUNTER — Other Ambulatory Visit (HOSPITAL_BASED_OUTPATIENT_CLINIC_OR_DEPARTMENT_OTHER): Payer: Medicare Other

## 2014-07-11 ENCOUNTER — Telehealth: Payer: Self-pay | Admitting: Physician Assistant

## 2014-07-11 ENCOUNTER — Ambulatory Visit (HOSPITAL_BASED_OUTPATIENT_CLINIC_OR_DEPARTMENT_OTHER): Payer: Medicare Other | Admitting: Physician Assistant

## 2014-07-11 ENCOUNTER — Ambulatory Visit (HOSPITAL_BASED_OUTPATIENT_CLINIC_OR_DEPARTMENT_OTHER): Payer: Medicare Other

## 2014-07-11 ENCOUNTER — Other Ambulatory Visit: Payer: Self-pay | Admitting: Hematology

## 2014-07-11 ENCOUNTER — Ambulatory Visit: Payer: Medicare Other | Admitting: Cardiology

## 2014-07-11 VITALS — BP 135/75 | HR 65 | Temp 97.8°F | Resp 18 | Ht 68.0 in | Wt 211.3 lb

## 2014-07-11 DIAGNOSIS — R609 Edema, unspecified: Secondary | ICD-10-CM

## 2014-07-11 DIAGNOSIS — Z5112 Encounter for antineoplastic immunotherapy: Secondary | ICD-10-CM | POA: Diagnosis not present

## 2014-07-11 DIAGNOSIS — C8313 Mantle cell lymphoma, intra-abdominal lymph nodes: Secondary | ICD-10-CM

## 2014-07-11 DIAGNOSIS — C8310 Mantle cell lymphoma, unspecified site: Secondary | ICD-10-CM

## 2014-07-11 DIAGNOSIS — Z5111 Encounter for antineoplastic chemotherapy: Secondary | ICD-10-CM | POA: Diagnosis not present

## 2014-07-11 LAB — CBC WITH DIFFERENTIAL/PLATELET
BASO%: 0.9 % (ref 0.0–2.0)
BASOS ABS: 0.1 10*3/uL (ref 0.0–0.1)
EOS%: 7.6 % — ABNORMAL HIGH (ref 0.0–7.0)
Eosinophils Absolute: 0.4 10*3/uL (ref 0.0–0.5)
HCT: 37.5 % — ABNORMAL LOW (ref 38.4–49.9)
HGB: 12.4 g/dL — ABNORMAL LOW (ref 13.0–17.1)
LYMPH%: 25 % (ref 14.0–49.0)
MCH: 33.1 pg (ref 27.2–33.4)
MCHC: 33.1 g/dL (ref 32.0–36.0)
MCV: 100 fL — ABNORMAL HIGH (ref 79.3–98.0)
MONO#: 0.7 10*3/uL (ref 0.1–0.9)
MONO%: 13.7 % (ref 0.0–14.0)
NEUT%: 52.8 % (ref 39.0–75.0)
NEUTROS ABS: 2.9 10*3/uL (ref 1.5–6.5)
NRBC: 0 % (ref 0–0)
Platelets: ADEQUATE 10*3/uL (ref 140–400)
RBC: 3.75 10*6/uL — AB (ref 4.20–5.82)
RDW: 13.8 % (ref 11.0–14.6)
WBC: 5.4 10*3/uL (ref 4.0–10.3)
lymph#: 1.4 10*3/uL (ref 0.9–3.3)

## 2014-07-11 LAB — COMPREHENSIVE METABOLIC PANEL (CC13)
ALT: 16 U/L (ref 0–55)
ANION GAP: 7 meq/L (ref 3–11)
AST: 19 U/L (ref 5–34)
Albumin: 3.6 g/dL (ref 3.5–5.0)
Alkaline Phosphatase: 76 U/L (ref 40–150)
BUN: 19.6 mg/dL (ref 7.0–26.0)
CHLORIDE: 108 meq/L (ref 98–109)
CO2: 25 meq/L (ref 22–29)
Calcium: 8.8 mg/dL (ref 8.4–10.4)
Creatinine: 1.4 mg/dL — ABNORMAL HIGH (ref 0.7–1.3)
GLUCOSE: 117 mg/dL (ref 70–140)
Potassium: 4.4 mEq/L (ref 3.5–5.1)
SODIUM: 140 meq/L (ref 136–145)
TOTAL PROTEIN: 5.9 g/dL — AB (ref 6.4–8.3)
Total Bilirubin: 0.48 mg/dL (ref 0.20–1.20)

## 2014-07-11 LAB — LACTATE DEHYDROGENASE (CC13): LDH: 209 U/L (ref 125–245)

## 2014-07-11 MED ORDER — SODIUM CHLORIDE 0.9 % IV SOLN
Freq: Once | INTRAVENOUS | Status: AC
  Administered 2014-07-11: 11:00:00 via INTRAVENOUS

## 2014-07-11 MED ORDER — BORTEZOMIB CHEMO SQ INJECTION 3.5 MG (2.5MG/ML)
1.3000 mg/m2 | Freq: Once | INTRAMUSCULAR | Status: AC
  Administered 2014-07-11: 2.75 mg via SUBCUTANEOUS
  Filled 2014-07-11: qty 2.75

## 2014-07-11 MED ORDER — DIPHENHYDRAMINE HCL 25 MG PO CAPS
25.0000 mg | ORAL_CAPSULE | Freq: Once | ORAL | Status: AC
  Administered 2014-07-11: 25 mg via ORAL

## 2014-07-11 MED ORDER — DIPHENHYDRAMINE HCL 25 MG PO CAPS
ORAL_CAPSULE | ORAL | Status: AC
  Filled 2014-07-11: qty 1

## 2014-07-11 MED ORDER — HEPARIN SOD (PORK) LOCK FLUSH 100 UNIT/ML IV SOLN
500.0000 [IU] | Freq: Once | INTRAVENOUS | Status: AC | PRN
  Administered 2014-07-11: 500 [IU]
  Filled 2014-07-11: qty 5

## 2014-07-11 MED ORDER — ONDANSETRON 8 MG/50ML IVPB (CHCC)
8.0000 mg | Freq: Once | INTRAVENOUS | Status: AC
  Administered 2014-07-11: 8 mg via INTRAVENOUS

## 2014-07-11 MED ORDER — SODIUM CHLORIDE 0.9 % IJ SOLN
10.0000 mL | INTRAMUSCULAR | Status: DC | PRN
  Administered 2014-07-11: 10 mL
  Filled 2014-07-11: qty 10

## 2014-07-11 MED ORDER — ACETAMINOPHEN 325 MG PO TABS
ORAL_TABLET | ORAL | Status: AC
  Filled 2014-07-11: qty 2

## 2014-07-11 MED ORDER — ACETAMINOPHEN 325 MG PO TABS
650.0000 mg | ORAL_TABLET | Freq: Once | ORAL | Status: AC
  Administered 2014-07-11: 650 mg via ORAL

## 2014-07-11 MED ORDER — RITUXIMAB CHEMO INJECTION 500 MG/50ML
375.0000 mg/m2 | Freq: Once | INTRAVENOUS | Status: AC
  Administered 2014-07-11: 800 mg via INTRAVENOUS
  Filled 2014-07-11: qty 80

## 2014-07-11 MED ORDER — DEXAMETHASONE SODIUM PHOSPHATE 20 MG/5ML IJ SOLN
INTRAMUSCULAR | Status: AC
  Filled 2014-07-11: qty 5

## 2014-07-11 MED ORDER — SODIUM CHLORIDE 0.9 % IV SOLN
190.0000 mg/m2 | Freq: Once | INTRAVENOUS | Status: AC
  Administered 2014-07-11: 400 mg via INTRAVENOUS
  Filled 2014-07-11: qty 20

## 2014-07-11 MED ORDER — DEXAMETHASONE SODIUM PHOSPHATE 4 MG/ML IJ SOLN
20.0000 mg | Freq: Once | INTRAMUSCULAR | Status: AC
  Administered 2014-07-11: 20 mg via INTRAVENOUS

## 2014-07-11 MED ORDER — ONDANSETRON 8 MG/NS 50 ML IVPB
INTRAVENOUS | Status: AC
  Filled 2014-07-11: qty 8

## 2014-07-11 NOTE — Patient Instructions (Signed)
[facility] Discharge Instructions for Patients Receiving Chemotherapy  Today you received the following chemotherapy agents: Cytoxan, Velcade, Rituxan  To help prevent nausea and vomiting after your treatment, we encourage you to take your nausea medication as prescribed.   If you develop nausea and vomiting that is not controlled by your nausea medication, call the clinic.   BELOW ARE SYMPTOMS THAT SHOULD BE REPORTED IMMEDIATELY:  *FEVER GREATER THAN 100.5 F  *CHILLS WITH OR WITHOUT FEVER  NAUSEA AND VOMITING THAT IS NOT CONTROLLED WITH YOUR NAUSEA MEDICATION  *UNUSUAL SHORTNESS OF BREATH  *UNUSUAL BRUISING OR BLEEDING  TENDERNESS IN MOUTH AND THROAT WITH OR WITHOUT PRESENCE OF ULCERS  *URINARY PROBLEMS  *BOWEL PROBLEMS  UNUSUAL RASH Items with * indicate a potential emergency and should be followed up as soon as possible.  Feel free to call the clinic you have any questions or concerns. The clinic phone number is [phone].

## 2014-07-11 NOTE — Patient Instructions (Signed)
Continue labs and chemotherapy as scheduled FOllow up in 3 weeks with Dr. Alvy Bimler with a restaging PET scan to re-evaluate your disease

## 2014-07-11 NOTE — Progress Notes (Signed)
[place] [facility] OFF[facility] PROGRESS NOTE DATE OF VISIT: 06/20/2014  [name], MD [place] [address]  DIAGNOSIS: MCL (Mantle cell Lymphoma) on maintenance chemotherapy.  PRINCIPLE DIAGNOSIS: Mantle cell lymphoma diagnosed in March 2008 with positive bone marrow initially on 12/03/2006 and then negative bone marrow after treatment in October 2009. As stated, the patient presented with obstructive liver abnormalities requiring ERCP and non metal stent placement by Dr. Erskine Emery. The stent was subsequently removed in October 2008.   PRIOR THERAPY: The patient was treated with 8 cycles of Rituxan, Cytoxan, vincristine and Decadron in combination with Neulasta from 12/19/2006 through 05/15/2007. The patient then received maintenance Rituxan from July 16, 2007 through February 01, 2010. While on Rituxan, he had a recurrence of his disease by CT scan carried out on 01/24/2010. Mr. Arnett then received treatment with bendamustine and Rituxan in combination with Neulasta for 6 cycles from 02/19/2010 through 07/26/2010. We have a prior PET scan from 08/22/2010 and CT scans of chest, abdomen, and pelvis from 01/01/2011 that showed no evidence of disease. He had been off treatment since December 2011 and had been doing well without any symptoms until the CT scan of the abdomen and pelvis on 07/01/2011 showed signs of recurrence. CT scans of abdomen and pelvis with IVC on 09/02/11 showed further progression. Rituxan, subcutaneous Velcade, intravenous Cytoxan and Decadron were initiated on 10/15/2011. CT scans of the abdomen and pelvis carried out on 01/08/2012 showed a near complete response to therapy. CT scan of the abdomen and pelvis with IV contrast carried out on 06/19/2012 showed stable plaque-like soft tissue density in the upper abdominal retroperitoneum compared with the CT scan of 01/08/2012. CT scan of the abdomen and pelvis with IV contrast carried out on  11/30/2012 showed no evidence of recurrent lymphoma. Last restaging scan was in April 2015 negative for recurrence.  CURRENT THERAPY:  As of 08/05/2013, treatment program will be as follows:  -Velcade 2.75 mg subcu.  -Cytoxan 400 mg IV.  -Decadron 20 mg IV.  The above drugs will be given every 3 weeks.  -Rituxan 800 mg IV every 6 weeks.  Previously Velcade, Cytoxan, and Decadron were being administered every 2 weeks, and Rituxan was being administered every 4 weeks. (from 12/04/2012 - 08/05/2013). Prior to that  beginning on 10/15/2011 - 12/04/2012, Velcade, Cytoxan, and Decadron were being administered weekly, and Rituxan was being administered every 2 weeks.  INTERIM HISTORY: Patient is in today for a followup visit prior to proceeding with his next scheduled cycle of  Rituximab, Velcade, Cytoxan and dexamethasone for treatment of his relapsed mantle cell lymphoma, originally diagnosed March 2008.    Since his last office visit on 06/20/2014 he's been doing well. He continues to have bilateral lower extremity edema.this is more pronounced on the right than on the left. He is on Lasix but is finding that it is not helping that much. He is followed by cardiology for his history of coronary artery disease. He has a follow-up appointment with his cardiologist on 07/13/2014.  He is tolerating his chemotherapy well. He voices no specific complaints today. He denies any recent episodes of chest pain. Of note patient received Pneumovax vaccine in 2013. He denies any cardiac symptoms, nausea, vomiting, dyspnea, or rash.  His renal function is slightly elevated at 1.4 today and albumin is slightly low at 5.9.   He presents to proceed with cycle #81 day 1 of therapy. He will receive Rituxan with this cycle if  it has been 6 weeks since his previous Rituxan therapy.    MEDICAL HISTORY: Past Medical History  Diagnosis Date  . Hypertension   . CAD 11/12/2007    a. s/p CABG;  b. Cath 7/11 Patent LIMA to  the LAD. Previously known occlusion of a saphenous vein graft to diagonal and sequential to obtuse marginals. Severe diffuse native obtuse marginal and diagonal branch vessel disease. Preserved ejection fraction.;  c. 03/2013 Lexi CL: mild inflat and inf ischemia, EF 54%->initial med rx.  Marland Kitchen HYPERLIPIDEMIA 11/12/2007  . HYPERTENSION 11/12/2007  . PERIPHERAL VASCULAR DISEASE 11/12/2007  . ARTHRITIS 11/12/2007  . BENIGN PROSTATIC HYPERTROPHY, HX OF 11/12/2007  . Edema 10/17/2010  . INGUINAL HERNIAS, BILATERAL 11/10/2006  . Obesity, unspecified 07/05/2009  . SLEEP APNEA 11/12/2007  . History of PTCA 2005  . Osteoarthritis   . Carotid stenosis, bilateral   . Nephrolithiasis   . thymus ca dx'd 2003    Radiation comp 2003  . NHL (non-Hodgkin's lymphoma) dx'd 2008    Chemo comp 10/2010; rituxin comp 10/2010  . 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 MANTLE CELL LYMPHOMA INTRA-ABDOMINAL LYMPH NODES 11/12/2007  . Tricuspid valve regurgitation     INTERIM HISTORY: has CANCER, THYMUS; Mantle cell lymphoma of intra-abdominal lymph nodes; HYPERLIPIDEMIA; Obesity, unspecified; HYPERTENSION; CAD; PERIPHERAL VASCULAR DISEASE; INGUINAL HERNIAS, BILATERAL; NEPHROLITHIASIS, RECURRENT; ARTHRITIS; SLEEP APNEA; BENIGN PROSTATIC HYPERTROPHY, HX OF; EDEMA; Edema; Bruising; Ulcer of lower limb, unspecified; Midsternal chest pain; Fracture of femoral neck, left; Obesity (BMI 30-39.9); Acute blood loss anemia; and Tricuspid regurgitation on his problem list.    ALLERGIES:  is allergic to atorvastatin.  MEDICATIONS: has a current medication list which includes the following prescription(s): acyclovir, amlodipine, aspirin, atenolol, citalopram, clopidogrel, furosemide, hydrocodone-acetaminophen, isosorbide mononitrate, lactose free nutrition, lidocaine-prilocaine, methocarbamol, pravastatin, PRESCRIPTION MEDICATION, sulfamethoxazole-trimethoprim, and nitroglycerin, and the following Facility-Administered Medications: bortezomib sq,  cyclophosphamide (CYTOXAN) 400 mg in sodium chloride 0.9 % 250 mL chemo infusion, heparin lock flush, riTUXimab (RITUXAN) 800 mg in sodium chloride 0.9 % 170 mL chemo infusion, sodium chloride, and sodium chloride.  SURGICAL HISTORY:  Past Surgical History  Procedure Laterality Date  . Coronary artery bypass graft    . Cardiac catheterization  08/2009, 02/2010  . Total hip arthroplasty Left 12/09/2013    Procedure: TOTAL HIP ARTHROPLASTY;  Surgeon: Mauri Pole, MD;  Location: [facility];  Service: Orthopedics;  Laterality: Left;   REVIEW OF SYSTEMS:   Constitutional: Denies fevers, chills or abnormal weight loss Eyes: Denies blurriness of vision Ears, nose, mouth, throat, and face: Denies mucositis or sore throat Respiratory: Denies cough, dyspnea or wheezes Cardiovascular: Denies palpitation, chest discomfort or lower extremity swelling Gastrointestinal:  Denies nausea, heartburn or change in bowel habits Skin: Denies abnormal skin rashes Lymphatics: Denies new lymphadenopathy or easy bruising Neurological:Denies numbness, tingling or new weaknesses Behavioral/Psych: Mood is stable, no new changes  All other systems were reviewed with the patient and are negative.  PHYSICAL EXAMINATION: ECOG PERFORMANCE STATUS: 0  Blood pressure 135/75, pulse 65, temperature 97.8 F (36.6 C), temperature source Oral, resp. rate 18, height _0  (1.727 m), weight 211 lb 4.8 oz (95.845 kg).  GENERAL:alert, no distress and comfortable;well developed and well nourished.  SKIN: skin color, texture, turgor are normal, no rashes or significant lesions EYES: normal, Conjunctiva are pink and non-injected, sclera clear OROPHARYNX:no exudate, no erythema and lips, buccal mucosa, and tongue normal  NECK: supple, thyroid normal size, non-tender, without nodularity LYMPH:  no palpable lymphadenopathy in the cervical, axillary or supraclavicular LUNGS:  clear to auscultation and percussion with normal breathing  effort HEART: regular rate & rhythm and no murmurs and bilateral extremity edema, R>L (chronic due to heart surgery ) and bandaged.  ABDOMEN:abdomen soft, non-tender and normal bowel sounds Musculoskeletal:no cyanosis of digits and no clubbing. 1+ to 2++ bilateral lower extremity edema greater on the right than on the left NEURO: alert & oriented x 3 with fluent speech, no focal motor/sensory deficits  Labs:  Lab Results  Component Value Date   WBC 5.4 07/11/2014   HGB 12.4* 07/11/2014   HCT 37.5* 07/11/2014   MCV 100.0* 07/11/2014   PLT Clumped Platelets--Appears Adequate 07/11/2014   NEUTROABS 2.9 07/11/2014      Chemistry      Component Value Date/Time   NA 140 07/11/2014 0933   NA 141 12/11/2013 0524   NA 142 09/02/2011 0936   K 4.4 07/11/2014 0933   K 4.2 12/11/2013 0524   K 4.1 09/02/2011 0936   CL 102 12/11/2013 0524   CL 106 01/29/2013 0900   CL 98 09/02/2011 0936   CO2 25 07/11/2014 0933   CO2 29 12/11/2013 0524   CO2 29 09/02/2011 0936   BUN 19.6 07/11/2014 0933   BUN 15 12/11/2013 0524   BUN 17 09/02/2011 0936   CREATININE 1.4* 07/11/2014 0933   CREATININE 1.03 12/11/2013 0524   CREATININE 0.8 09/02/2011 0936      Component Value Date/Time   CALCIUM 8.8 07/11/2014 0933   CALCIUM 7.9* 12/11/2013 0524   CALCIUM 8.5 09/02/2011 0936   ALKPHOS 76 07/11/2014 0933   ALKPHOS 59 04/08/2012 0850   ALKPHOS 72 09/02/2011 0936   AST 19 07/11/2014 0933   AST 21 04/08/2012 0850   AST 21 09/02/2011 0936   ALT 16 07/11/2014 0933   ALT 14 04/08/2012 0850   ALT 17 09/02/2011 0936   BILITOT 0.48 07/11/2014 0933   BILITOT 0.6 04/08/2012 0850   BILITOT 0.60 09/02/2011 0936       RADIOGRAPHIC STUDIES:  1. CT scan of chest, abdomen and pelvis with IV contrast on 01/01/2011 were negative for evidence of recurrent lymphoma.  2. PET scan from 08/22/2010 showed no evidence for recurrent lymphoma.  3. CT scan of chest, abdomen and pelvis with IV contrast on 01/01/2011  showed no evidence for lymphoma.  4. CT scan of abdomen and pelvis on 07/01/2011 showed interval development of peritoneal disease worrisome for recurrence of lymphoma. There was new right external iliac lymph node pathologically enlarged and worrisome for recurrent lymphoma. There was some stable appearance of mild soft tissue stranding surrounding the celiac trunk and superior mesenteric artery.  5. Chest x-ray, 2 view, from 09/02/2011 showed no acute findings.  6. CT scan of abdomen and pelvis with IV contrast on 09/02/2011 showed interval progression of lymphadenopathy in the abdomen and pelvis. 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 The 1.1 x 1.7 cm omental nodule which was measured on the prior study of 07/01/2011 now measures 4.1 x 2.4 cm. A 2nd discrete soft tissue nodule in the omentum which previously measured 1.1 x 1.5 cm now measures 2.2 x 3.0 cm.  7. CT scan of abdomen and pelvis with IV contrast on 01/08/2012 showed no residual measurable mass along the anterior aspect of the right diaphragm at the site of the previously demonstrated 4.9 x 2.1 cm nodule. There is no residual measurable gastrohepatic or celiac lymphadenopathy. There is some soft tissue stranding around the proximal abdominal aorta, celiac trunk and superior mesenteric artery as noted on  images 27-34. Pelvic lymphadenopathy is nearly completely resolved. A right external iliac node measuring 6 mm on image 66 previously measured 9 mm. Another node which previously measured 17 mm short axis now measures 8 mm on image 70. There is no residual inguinal adenopathy. The spleen remains normal in size, demonstrates no focal abnormalities. There are no suspect osseous findings. There is no residual mass at the left posterior costophrenic angle. The final impression was near complete response to therapy in the lymphadenopathy and omental disease with some minimal residual measurable disease as previously noted. There was an enlarged left inguinal  hernia containing a knuckle of sigmoid colon with  no evidence for incarceration or bowel obstruction. There was a nonobstructing left renal calculus.  8. CT scan of abdomen and pelvis with IV contrast carried out on 06/19/2012 was compared with the scan of 01/08/2012 and showed stable plaque-like soft tissue density in the upper abdominal retroperitoneum, which had the appearance of treated lymphoma. No new or progressive disease within the abdomen or pelvis was noted. There is a stable incidental finding of a small left inguinal hernia and diverticulosis.  9. Chest x-ray, 2-view, from 09/07/2012 showed low lung volumes with streaky basilar atelectasis.  10. CT scan of abdomen and pelvis with IV contrast from 11/30/2012 compared with the prior CT scans from 06/19/2012 showed no significant changes and no evidence of recurrent lymphoma or other acute findings.  11. Nuclear Myocardial scan (04/09/2013) Lexiscan Sestamibi: Clinically negative, electrically negative for ischemia. MIBI scan with evidence of a ischemia in the inferolateral wall and mild ischemia in the inferior wall. Overall region small to medium in size. LVEF on gating calculated at 54%.  12. CT scan of chest, abdomen and pelvis with IV contrast (04/08/2013) when compared to 11/30/2012 showed no evidence of lymphomatous involvement in the chest. No evidence of lymphomatous involvement in the abdomen/pelvis and mildly thick-walled bladder, correlate for cystitis. 13. CT scan of abdomen and pelvis with IV contrast (08/04/2013) showed no evidence of recurrent disease in the abdomen pelvis.  14. CT scan of chest, abdomen and pelvis with IV contrast (12/02/2013) showed no evidence of recurrent disease in the chest, abdomen and pelvis.   ASSESSMENT: Martin Morris 80 y.o. male with a history of MCL on maintenece chemotherapy with Velcade, Cytoxan and Dexamethasone. He gets Rituxan every alternate cycle. His labs have been reviewed and are within  physical range he'll proceed with chemotherapy today as scheduled.    PLAN:  1. Mantle cell lymphoma, now in remission.  -- CT of abdomen and pelvis as noted above on 12/02/2013 demonstrated his disease is now in remission. We balanced continuing his chemotherapy and its benefit on controlling his mantle cell lymphoma with the risk of his ongoing chest discomfort that he associates with his therapy. We reviewed his nuclear myocardial scans and EKG and cardiac cath report. We provided him a brief holiday from his therapy given his recent fall and Left hip fracture requiring a total hip replacement.  We resumed his chemotherapy on Wednesday, 02/16/2014. He voiced agreement to continuing chemotherapy in the setting of his cardiac history. He has tolerated therapy well without additional chest pain.   We will see him monthly and repeat scans every 4 months to assess for reoccurrence.   --Today, he is scheduled to receive the following:           Velcade 1.3 mg per meter square,       Cytoxan 190 mg per meter square,  Decadron 20 mg IV, and Rituxan 800 mg IV        Previously, he has been provided handouts detailing each chemotherapy agent including each agent's indications, side effects,etc.  He understands a risk of marrow suppression and if he has a fever of 100.5 or greater to call the M.D. on call and/or report to the emergency room.  He understood these risks and benefits as detailed above and decided to continue his chemotherapy. I reviewed his labs and examined him and approved his chemotherapy today.  --We obtained a CT of abdomen pelvis on 12/02/2013 for re-staging as indicated above. Since it is stable, we started a q 3 week cycle instead of q 2 week cycle to allow him more time to recuperate between cycles and decrease risk of chest pain reoccurrence. We will plan next restaging scan in next year around April 2015 unless we have new onset symptoms or lab abnormalities.  2. Chest pain,  resolved.  -- His recent cardiac catherization (04/13/2013) revealed severe native vessel disease two of three grafts occluded. The LIMA to the LAD has moderate graft stenosis. Preserved EF but a poor candidate for CABG. He is asymptomatic for chest pain presently.  He is handling his basic and intermediate ADLs without difficulty.  Patient has a follow-up appointment with his cardiologist on 07/13/2014.  3. S/p L THR (12/09/2013). --He reports that he is near his baseline.  He denies pain.   4. Lower extremity edema (R greater than L) complicated by weeping and abrasions. --He reports slight improvement and stablility since starting lasix. Patient to follow-up with his cardiologist as scheduled on 07/13/2014.we will leave adjustment of his diuretics/management of the lower extremity edema to his cardiologist.  5. He did get a flu shot  On 05/09/2014 and a pneumonia shot 09/06/2011   6. Follow-up. Patient was reviewed with Dr. Lona Kettle. The patient was also reviewed with Dr. Alvy Bimler, as she will be taking over the patient's care. He will follow-up in 3 weeks with a repeat CBC differential, C met LDH as well as a restaging PET scan to reevaluate his diseaseand to discuss treatment options..  All questions were answered. The patient knows to call the clinic with any problems, questions or concerns. We can certainly see the patient much sooner if necessary.     Carlton Adam, PA-C 07/11/2014

## 2014-07-11 NOTE — Telephone Encounter (Signed)
Gave avs & cal forDec. Sent mess to sch tx. °

## 2014-07-11 NOTE — Assessment & Plan Note (Addendum)
Patient is a very pleasant 80 year old Caucasian male with mantle cell lymphoma originally diagnosed in March 2008. He has been heavily treated since his diagnosis with 8 cycles of Rituxan Cytoxan vincristine and Decadron with Neulasta, followed by maintenance Rituxan, followed by bendamustine plus Rituxan versus 6 cycles with Neulasta support. Rituxan and subcutaneous Velcade with IV Cytoxan plus dexamethasone was initiated in February 2013 secondary to evidence for disease progression on his restaging CT scan. He has been on maintenance chemotherapy consisting of Velcade at 0.7 mg/m2,Cytoxan 190 mg/m, dexamethasone 20 mg IV every 3 weeks in addition to Rituxan at 375 mg/m given every 6 weeks since November 2014. Overall is been tolerating his chemotherapy relatively well. He has had issues with lower extremity edema and is followed by cardiology, with a follow-up appointment with his cardiologist on 07/13/2014. Patient was reviewed with Dr. Lona Kettle, as well as with Dr. Candis Musa that she will be taking over Mr. Barros care. He'll proceed with chemotherapy today as scheduled. He will follow-up with Dr. Alvy Bimler in 3 weeks with a restaging PET scan to reevaluate his disease and to discuss treatment options.

## 2014-07-13 ENCOUNTER — Ambulatory Visit: Payer: Medicare Other | Admitting: Cardiology

## 2014-07-14 ENCOUNTER — Encounter: Payer: Self-pay | Admitting: Cardiology

## 2014-07-14 ENCOUNTER — Ambulatory Visit: Payer: Medicare Other | Admitting: Cardiology

## 2014-07-14 ENCOUNTER — Ambulatory Visit (INDEPENDENT_AMBULATORY_CARE_PROVIDER_SITE_OTHER): Payer: Medicare Other | Admitting: Cardiology

## 2014-07-14 VITALS — BP 136/82 | HR 62 | Ht 67.0 in | Wt 212.2 lb

## 2014-07-14 DIAGNOSIS — E878 Other disorders of electrolyte and fluid balance, not elsewhere classified: Secondary | ICD-10-CM

## 2014-07-14 NOTE — Patient Instructions (Addendum)
Your physician recommends that you schedule a follow-up appointment in: 6 months with Dr. Percival Spanish  Get you labs done in 10 days at Dr. Tawanna Sat office   Take Lasix 40 mg daily

## 2014-07-14 NOTE — Addendum Note (Signed)
Addended by: Dolan Amen on: 07/14/2014 06:13 PM   Modules accepted: Orders

## 2014-07-14 NOTE — Progress Notes (Signed)
The patient presents for followup of his known coronary disease.  Most recent cardiac catheterization in August of 2014 demonstrated LAD proximal 90% stenosis, mid long severe diffuse disease. Ostial disease and a moderate first diagonal. Ostial disease in the second diagonal. The nondominant right coronary with 90% stenosis. LIMA to the LAD had 70% stenosis in the body of it. Vein graft to the OM and PDA was occluded. Vein graft to the diagonal is occluded. We managed him medically.  He then fell and broke a hip and had problems with this. He's been treated for lymphoma. Actually he's done well with this.  He does not have any increased shortness of breath, PND or orthopnea. He has not had any chest pressure, neck or arm discomfort. However, he does have increasing lower extremity swelling.  I gave him Lasix last time. He doesn't like to take it because it makes him urinate too much. However, he has been taking it more recently.  Allergies  Allergen Reactions  . Atorvastatin Other (See Comments)    Incredible dizziness    Current Outpatient Prescriptions  Medication Sig Dispense Refill  . acyclovir (ZOVIRAX) 400 MG tablet TAKE ONE TABLET BY MOUTH TWICE DAILY 60 tablet 2  . amLODipine (NORVASC) 5 MG tablet Take 1 tablet (5 mg total) by mouth daily. 30 tablet 11  . aspirin 81 MG tablet Take 81 mg by mouth every other day.    Marland Kitchen atenolol (TENORMIN) 50 MG tablet Take 1 tablet (50 mg total) by mouth daily. 30 tablet 11  . citalopram (CELEXA) 20 MG tablet TAKE ONE TABLET BY MOUTH ONCE DAILY 30 tablet 2  . clopidogrel (PLAVIX) 75 MG tablet Take 1 tablet (75 mg total) by mouth daily. 30 tablet 11  . furosemide (LASIX) 20 MG tablet Take one daily as needed for fluid 30 tablet 3  . HYDROcodone-acetaminophen (NORCO/VICODIN) 5-325 MG per tablet Take 1-2 tablets by mouth every 6 (six) hours as needed for moderate pain. 100 tablet 0  . isosorbide mononitrate (IMDUR) 120 MG 24 hr tablet Take 1 tablet (120 mg  total) by mouth daily. 30 tablet 11  . lactose free nutrition (BOOST PLUS) LIQD Take 237 mLs by mouth 2 (two) times daily as needed (Allow w/pt or pt's wife request after diet advancement). 60 Can 0  . lidocaine-prilocaine (EMLA) cream Apply 1 application topically See admin instructions. Apply to port 1 hour before treatment. Cover site with plastic wrap 30 g 2  . methocarbamol (ROBAXIN) 500 MG tablet Take 1 tablet (500 mg total) by mouth every 6 (six) hours as needed for muscle spasms. 50 tablet 0  . nitroGLYCERIN (NITROSTAT) 0.4 MG SL tablet Place 1 tablet (0.4 mg total) under the tongue every 5 (five) minutes as needed for chest pain. 25 tablet prn  . pravastatin (PRAVACHOL) 80 MG tablet Take 1 tablet (80 mg total) by mouth daily. 30 tablet 11  . PRESCRIPTION MEDICATION Inject into the vein every 21 ( twenty-one) days. Velcade and Cytoxan.    . sulfamethoxazole-trimethoprim (BACTRIM DS) 800-160 MG per tablet Take 1 tablet by mouth every Monday, Wednesday, and Friday. 24 tablet 2   No current facility-administered medications for this visit.   Facility-Administered Medications Ordered in Other Visits  Medication Dose Route Frequency Provider Last Rate Last Dose  . sodium chloride 0.9 % injection 10 mL  10 mL Intracatheter PRN Jeanie Cooks, MD   10 mL at 08/03/12 1419    Past Medical History  Diagnosis Date  .  Hypertension   . CAD 11/12/2007    a. s/p CABG;  b. Cath 7/11 Patent LIMA to the LAD. Previously known occlusion of a saphenous vein graft to diagonal and sequential to obtuse marginals. Severe diffuse native obtuse marginal and diagonal branch vessel disease. Preserved ejection fraction.;  c. 03/2013 Lexi CL: mild inflat and inf ischemia, EF 54%->initial med rx.  Marland Kitchen HYPERLIPIDEMIA 11/12/2007  . HYPERTENSION 11/12/2007  . PERIPHERAL VASCULAR DISEASE 11/12/2007  . ARTHRITIS 11/12/2007  . BENIGN PROSTATIC HYPERTROPHY, HX OF 11/12/2007  . Edema 10/17/2010  . INGUINAL HERNIAS, BILATERAL  11/10/2006  . Obesity, unspecified 07/05/2009  . SLEEP APNEA 11/12/2007  . History of PTCA 2005  . Osteoarthritis   . Carotid stenosis, bilateral   . Nephrolithiasis   . thymus ca dx'd 2003    Radiation comp 2003  . NHL (non-Hodgkin's lymphoma) dx'd 2008    Chemo comp 10/2010; rituxin comp 10/2010  . 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 MANTLE CELL LYMPHOMA INTRA-ABDOMINAL LYMPH NODES 11/12/2007  . Tricuspid valve regurgitation     Past Surgical History  Procedure Laterality Date  . Coronary artery bypass graft    . Cardiac catheterization  08/2009, 02/2010  . Total hip arthroplasty Left 12/09/2013    Procedure: TOTAL HIP ARTHROPLASTY;  Surgeon: Mauri Pole, MD;  Location: [facility];  Service: Orthopedics;  Laterality: Left;    ROS:  As stated in the HPI and negative for all other systems.  PHYSICAL EXAM BP 136/82 mmHg  Pulse 62  Ht 5\' 7"  (1.702 m)  Wt 212 lb 3.2 oz (96.253 kg)  BMI 33.23 kg/m2 GENERAL:  Well appearing HEENT:  Pupils equal round and reactive, fundi not visualized, oral mucosa unremarkable NECK:  No jugular venous distention, waveform within normal limits, carotid upstroke brisk and symmetric, no bruits, no thyromegaly LUNGS:  Clear to auscultation bilaterally BACK:  No CVA tenderness CHEST:  Well healed sternotomy scar. HEART:  PMI not displaced or sustained,S1 and S2 within normal limits, no S3, no S4, no clicks, no rubs, no murmurs ABD:  Flat, positive bowel sounds normal in frequency in pitch, no bruits, no rebound, no guarding, no midline pulsatile mass, no hepatomegaly, no splenomegaly EXT:  2 plus pulses throughout, severe right greater than left lower ext edema, no cyanosis no clubbing  EKG:  Sinus rhythm, rate 62, axis within normal limits, intervals within normal limits, poor anterior R wave progression, no acute ST-T wave changes, no change from previous. 07/14/2014   ASSESSMENT AND PLAN  CAD -  He has coronary anatomy as described. He has no new symptoms. No  change in therapy or further intervention as needed.  HYPERTENSION -  The blood pressure is at target. No change in medications is indicated. We will continue with therapeutic lifestyle changes (TLC).   HYPERLIPIDEMIA -  Although he is not on target statin according to current guidelines given his ongoing medical problems I will leave him on pravastatin.  EDEMA - This seems to be increased.  I will Increase his Lasix to 40 mg daily.  We talked about wearing compression stockings. He will keep his feet elevated. Otherwise he will continue the meds as listed.

## 2014-07-15 ENCOUNTER — Other Ambulatory Visit: Payer: Self-pay | Admitting: Internal Medicine

## 2014-07-18 ENCOUNTER — Telehealth: Payer: Self-pay | Admitting: *Deleted

## 2014-07-18 NOTE — Telephone Encounter (Signed)
Per staff message and POF I have scheduled appts. Advised scheduler of appts. JMW  

## 2014-07-20 ENCOUNTER — Other Ambulatory Visit: Payer: Self-pay | Admitting: *Deleted

## 2014-07-20 DIAGNOSIS — C8310 Mantle cell lymphoma, unspecified site: Secondary | ICD-10-CM

## 2014-07-20 MED ORDER — SULFAMETHOXAZOLE-TRIMETHOPRIM 800-160 MG PO TABS: 1.0000 | ORAL_TABLET | ORAL | Status: DC

## 2014-07-27 ENCOUNTER — Encounter (HOSPITAL_COMMUNITY): Payer: Self-pay

## 2014-07-27 ENCOUNTER — Ambulatory Visit (HOSPITAL_COMMUNITY)
Admission: RE | Admit: 2014-07-27 | Discharge: 2014-07-27 | Disposition: A | Discharge status: Home / Self Care | Payer: Medicare Other | Source: Ambulatory Visit | Attending: Physician Assistant | Admitting: Physician Assistant

## 2014-07-27 DIAGNOSIS — C8313 Mantle cell lymphoma, intra-abdominal lymph nodes: Secondary | ICD-10-CM

## 2014-07-27 LAB — GLUCOSE, CAPILLARY: Glucose-Capillary: 83 mg/dL (ref 70–99)

## 2014-07-27 MED ORDER — FLUDEOXYGLUCOSE F - 18 (FDG) INJECTION
10.1000 | Freq: Once | INTRAVENOUS | Status: AC | PRN
  Administered 2014-07-27: 10.1 via INTRAVENOUS

## 2014-08-01 ENCOUNTER — Encounter: Payer: Self-pay | Admitting: Hematology and Oncology

## 2014-08-01 ENCOUNTER — Other Ambulatory Visit (HOSPITAL_BASED_OUTPATIENT_CLINIC_OR_DEPARTMENT_OTHER): Payer: Medicare Other

## 2014-08-01 ENCOUNTER — Ambulatory Visit (HOSPITAL_BASED_OUTPATIENT_CLINIC_OR_DEPARTMENT_OTHER): Payer: Medicare Other | Admitting: Hematology and Oncology

## 2014-08-01 VITALS — BP 119/57 | HR 72 | Temp 98.2°F | Resp 18 | Ht 67.0 in | Wt 207.9 lb

## 2014-08-01 DIAGNOSIS — N182 Chronic kidney disease, stage 2 (mild): Secondary | ICD-10-CM

## 2014-08-01 DIAGNOSIS — C8313 Mantle cell lymphoma, intra-abdominal lymph nodes: Secondary | ICD-10-CM

## 2014-08-01 DIAGNOSIS — D630 Anemia in neoplastic disease: Secondary | ICD-10-CM

## 2014-08-01 LAB — COMPREHENSIVE METABOLIC PANEL (CC13)
ALT: 12 U/L (ref 0–55)
AST: 18 U/L (ref 5–34)
Albumin: 3.6 g/dL (ref 3.5–5.0)
Alkaline Phosphatase: 84 U/L (ref 40–150)
Anion Gap: 12 meq/L — ABNORMAL HIGH (ref 3–11)
BUN: 24.5 mg/dL (ref 7.0–26.0)
CO2: 28 meq/L (ref 22–29)
Calcium: 9 mg/dL (ref 8.4–10.4)
Chloride: 103 meq/L (ref 98–109)
Creatinine: 1.6 mg/dL — ABNORMAL HIGH (ref 0.7–1.3)
EGFR: 42 ml/min/1.73 m2 — ABNORMAL LOW
Glucose: 156 mg/dL — ABNORMAL HIGH (ref 70–140)
Potassium: 3.8 meq/L (ref 3.5–5.1)
Sodium: 143 meq/L (ref 136–145)
Total Bilirubin: 0.58 mg/dL (ref 0.20–1.20)
Total Protein: 6 g/dL — ABNORMAL LOW (ref 6.4–8.3)

## 2014-08-01 LAB — CBC WITH DIFFERENTIAL/PLATELET
BASO%: 0.6 % (ref 0.0–2.0)
Basophils Absolute: 0 10*3/uL (ref 0.0–0.1)
EOS ABS: 0.3 10*3/uL (ref 0.0–0.5)
EOS%: 3.8 % (ref 0.0–7.0)
HCT: 36.3 % — ABNORMAL LOW (ref 38.4–49.9)
HGB: 11.8 g/dL — ABNORMAL LOW (ref 13.0–17.1)
LYMPH%: 30.6 % (ref 14.0–49.0)
MCH: 32.9 pg (ref 27.2–33.4)
MCHC: 32.5 g/dL (ref 32.0–36.0)
MCV: 101.1 fL — AB (ref 79.3–98.0)
MONO#: 0.7 10*3/uL (ref 0.1–0.9)
MONO%: 9.4 % (ref 0.0–14.0)
NEUT%: 55.6 % (ref 39.0–75.0)
NEUTROS ABS: 3.8 10*3/uL (ref 1.5–6.5)
PLATELETS: 196 10*3/uL (ref 140–400)
RBC: 3.59 10*6/uL — ABNORMAL LOW (ref 4.20–5.82)
RDW: 13.5 % (ref 11.0–14.6)
WBC: 6.9 10*3/uL (ref 4.0–10.3)
lymph#: 2.1 10*3/uL (ref 0.9–3.3)

## 2014-08-01 LAB — LACTATE DEHYDROGENASE (CC13): LDH: 199 U/L (ref 125–245)

## 2014-08-01 NOTE — Assessment & Plan Note (Signed)
This is chronic in nature, unlikely related to his cancer. Recommend close observation. 

## 2014-08-01 NOTE — Progress Notes (Signed)
[facility] progress notes  Patient Care Team: Dione Housekeeper, MD as PCP - General (Family Medicine) Heath Lark, MD as Consulting Physician (Hematology and Oncology)  CHIEF COMPLAINTS/PURPOSE OF VISIT:  Mantle cell lymphoma  HISTORY OF PRESENTING ILLNESS:  Martin Morris 80 y.o. male was transferred to my care after his prior physician has left.  I reviewed the patient's records extensive and collaborated the history with the patient. Summary of his history is as follows: He was diagnosed with Mantle cell lymphoma diagnosed in March 2008 with positive bone marrow initially on 12/03/2006 and then negative bone marrow after treatment in October 2009. The patient presented with obstructive liver abnormalities requiring ERCP and non metal stent placement by Dr. Erskine Emery. The stent was subsequently removed in October 2008.  The patient was treated with 8 cycles of Rituxan, Cytoxan, vincristine and Decadron in combination with Neulasta from 12/19/2006 through 05/15/2007. The patient then received maintenance Rituxan from July 16, 2007 through February 01, 2010. While on Rituxan, he had a recurrence of his disease by CT scan carried out on 01/24/2010. Mr. Lust then received treatment with bendamustine and Rituxan in combination with Neulasta for 6 cycles from 02/19/2010 through 07/26/2010. PET scan from 08/22/2010 and CT scans of chest, abdomen, and pelvis from 01/01/2011 that showed no evidence of disease. He had been off treatment since December 2011 and had been doing well without any symptoms until the CT scan of the abdomen and pelvis on 07/01/2011 showed signs of recurrence. CT scans of abdomen and pelvis with IVC on 09/02/11 showed further progression. Rituxan, subcutaneous Velcade, intravenous Cytoxan and Decadron were initiated on 10/15/2011. CT scans of the abdomen and pelvis carried out on 01/08/2012 showed a near complete response to therapy. CT scan of the abdomen  and pelvis with IV contrast carried out on 06/19/2012 showed stable plaque-like soft tissue density in the upper abdominal retroperitoneum compared with the CT scan of 01/08/2012. CT scan of the abdomen and pelvis with IV contrast carried out on 11/30/2012 showed no evidence of recurrent lymphoma.  As of 08/05/2013, treatment program will be as follows:  -Velcade 2.75 mg subcu.  -Cytoxan 400 mg IV.  -Decadron 20 mg IV.  The above drugs will be given every 3 weeks.  -Rituxan 800 mg IV every 6 weeks.  Previously Velcade, Cytoxan, and Decadron were being administered every 2 weeks, and Rituxan was being administered every 4 weeks. (from 12/04/2012 - 08/05/2013). Prior to that  beginning on 10/15/2011 - 12/04/2012, Velcade, Cytoxan, and Decadron were being administered weekly, and Rituxan was being administered every 2 weeks. In August of 2014, he was scheduled  for chemotherapy but had chest pain and it was held. He underwent a cardiac cath which revealed two of three native vessel ischemic disease on 04/13/2013 but he was a poor surgical candidate for a re-do CABG. His therapy was re-started in October 2014.   Overall he's tolerated his treatment without difficulty.  In April he had an accidental fall resulting in a left hip fracture.  He underwent L THR on 12/09/2013 by Dr. Paralee Cancel.  He was discharged to rehab.  Prior to his fall, he had restaging scans including CT C/A/P on 04/09 which demonstrated absence of recurrent lymphoma in the chest, abdomen or pelvis.   He complained of bilateral lower extremity edema and chronic back pain. He denies peripheral neuropathy from treatment. Denies new lymphadenopathy.   MEDICAL HISTORY:  Past Medical History  Diagnosis Date  .  Hypertension   . CAD 11/12/2007    a. s/p CABG;  b. Cath 7/11 Patent LIMA to the LAD. Previously known occlusion of a saphenous vein graft to diagonal and sequential to obtuse marginals. Severe diffuse native obtuse marginal and  diagonal branch vessel disease. Preserved ejection fraction.;  c. 03/2013 Lexi CL: mild inflat and inf ischemia, EF 54%->initial med rx.  Marland Kitchen HYPERLIPIDEMIA 11/12/2007  . HYPERTENSION 11/12/2007  . PERIPHERAL VASCULAR DISEASE 11/12/2007  . ARTHRITIS 11/12/2007  . BENIGN PROSTATIC HYPERTROPHY, HX OF 11/12/2007  . Edema 10/17/2010  . INGUINAL HERNIAS, BILATERAL 11/10/2006  . Obesity, unspecified 07/05/2009  . SLEEP APNEA 11/12/2007  . History of PTCA 2005  . Osteoarthritis   . Carotid stenosis, bilateral   . Nephrolithiasis   . thymus ca dx'd 2003    Radiation comp 2003  . NHL (non-Hodgkin's lymphoma) dx'd 2008    Chemo comp 10/2010; rituxin comp 10/2010  . 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 MANTLE CELL LYMPHOMA INTRA-ABDOMINAL LYMPH NODES 11/12/2007  . Tricuspid valve regurgitation     SURGICAL HISTORY: Past Surgical History  Procedure Laterality Date  . Coronary artery bypass graft    . Cardiac catheterization  08/2009, 02/2010  . Total hip arthroplasty Left 12/09/2013    Procedure: TOTAL HIP ARTHROPLASTY;  Surgeon: Mauri Pole, MD;  Location: [facility];  Service: Orthopedics;  Laterality: Left;    SOCIAL HISTORY: History   Social History  . Marital Status: Married    Spouse Name: N/A    Number of Children: N/A  . Years of Education: N/A   Occupational History  . Not on file.   Social History Main Topics  . Smoking status: Never Smoker   . Smokeless tobacco: Never Used  . Alcohol Use: No  . Drug Use: No  . Sexual Activity: Not on file   Other Topics Concern  . Not on file   Social History Narrative   The patient lives in [place] with his wife. He is a retired Engineer, agricultural. HE is accompanied today by his wife and daughter. No tobacco or alcohol use.    FAMILY HISTORY: Family History  Problem Relation Age of Onset  . Coronary artery disease Mother     died at 52  . Heart attack Sister     died at age 39  . Heart attack Brother     ALLERGIES:  is allergic to  atorvastatin.  MEDICATIONS:  Current Outpatient Prescriptions  Medication Sig Dispense Refill  . acyclovir (ZOVIRAX) 400 MG tablet TAKE ONE TABLET BY MOUTH TWICE DAILY 60 tablet 2  . amLODipine (NORVASC) 5 MG tablet Take 1 tablet (5 mg total) by mouth daily. 30 tablet 11  . aspirin 81 MG tablet Take 81 mg by mouth every other day.    Marland Kitchen atenolol (TENORMIN) 50 MG tablet Take 1 tablet (50 mg total) by mouth daily. 30 tablet 11  . citalopram (CELEXA) 20 MG tablet TAKE ONE TABLET BY MOUTH ONCE DAILY 30 tablet 2  . clopidogrel (PLAVIX) 75 MG tablet Take 1 tablet (75 mg total) by mouth daily. 30 tablet 11  . furosemide (LASIX) 20 MG tablet Take one daily as needed for fluid (Patient taking differently: 40 mg. Take one daily as needed for fluid) 30 tablet 3  . HYDROcodone-acetaminophen (NORCO/VICODIN) 5-325 MG per tablet Take 1-2 tablets by mouth every 6 (six) hours as needed for moderate pain. 100 tablet 0  . isosorbide mononitrate (IMDUR) 120 MG 24 hr tablet Take 1 tablet (120 mg  total) by mouth daily. 30 tablet 11  . lactose free nutrition (BOOST PLUS) LIQD Take 237 mLs by mouth 2 (two) times daily as needed (Allow w/pt or pt's wife request after diet advancement). 60 Can 0  . lidocaine-prilocaine (EMLA) cream Apply 1 application topically See admin instructions. Apply to port 1 hour before treatment. Cover site with plastic wrap 30 g 2  . methocarbamol (ROBAXIN) 500 MG tablet Take 1 tablet (500 mg total) by mouth every 6 (six) hours as needed for muscle spasms. 50 tablet 0  . nitroGLYCERIN (NITROSTAT) 0.4 MG SL tablet Place 1 tablet (0.4 mg total) under the tongue every 5 (five) minutes as needed for chest pain. 25 tablet prn  . pravastatin (PRAVACHOL) 80 MG tablet Take 1 tablet (80 mg total) by mouth daily. 30 tablet 11  . PRESCRIPTION MEDICATION Inject into the vein every 21 ( twenty-one) days. Velcade and Cytoxan.    . sulfamethoxazole-trimethoprim (BACTRIM DS,SEPTRA DS) 800-160 MG per tablet Take  1 tablet by mouth every Monday, Wednesday, and Friday. 24 tablet 3   No current facility-administered medications for this visit.    REVIEW OF SYSTEMS:   Constitutional: Denies fevers, chills or abnormal night sweats Eyes: Denies blurriness of vision, double vision or watery eyes Ears, nose, mouth, throat, and face: Denies mucositis or sore throat Respiratory: Denies cough, dyspnea or wheezes Cardiovascular: Denies palpitation, chest discomfort Gastrointestinal:  Denies nausea, heartburn or change in bowel habits Skin: Denies abnormal skin rashes Lymphatics: Denies new lymphadenopathy or easy bruising Neurological:Denies numbness, tingling or new weaknesses Behavioral/Psych: Mood is stable, no new changes  All other systems were reviewed with the patient and are negative.  PHYSICAL EXAMINATION: ECOG PERFORMANCE STATUS: 1 - Symptomatic but completely ambulatory  Filed Vitals:   08/01/14 1259  BP: 119/57  Pulse: 72  Temp: 98.2 F (36.8 C)  Resp: 18   Filed Weights   08/01/14 1259  Weight: 207 lb 14.4 oz (94.303 kg)    GENERAL:alert, no distress and comfortable SKIN: skin color, texture, turgor are normal, no rashes or significant lesions. Noted extensive skin bruising EYES: normal, conjunctiva are pink and non-injected, sclera clear OROPHARYNX:no exudate, normal lips, buccal mucosa, and tongue  NECK: supple, thyroid normal size, non-tender, without nodularity LYMPH:  no palpable lymphadenopathy in the cervical, axillary or inguinal LUNGS: clear to auscultation and percussion with normal breathing effort HEART: regular rate & rhythm and no murmurs with moderate bilateral lower extremity edema ABDOMEN:abdomen soft, non-tender and normal bowel sounds Musculoskeletal:no cyanosis of digits and no clubbing  PSYCH: alert & oriented x 3 with fluent speech NEURO: no focal motor/sensory deficits  LABORATORY DATA:  I have reviewed the data as listed Lab Results  Component Value  Date   WBC 6.9 08/01/2014   HGB 11.8* 08/01/2014   HCT 36.3* 08/01/2014   MCV 101.1* 08/01/2014   PLT 196 08/01/2014    Recent Labs  12/09/13 0130 12/10/13 0615 12/11/13 0524  06/20/14 0946 07/11/14 0933 08/01/14 1216  NA 140 138 141  < > 141 140 143  K 3.8 4.4 4.2  < > 4.1 4.4 3.8  CL 102 102 102  --   --   --   --   CO2 25 27 29   < > 25 25 28   GLUCOSE 135* 121* 117*  < > 97 117 156*  BUN 24* 17 15  < > 13.6 19.6 24.5  CREATININE 1.33 1.05 1.03  < > 1.2 1.4* 1.6*  CALCIUM 9.1 8.1* 7.9*  < >  9.2 8.8 9.0  GFRNONAA 49* 65* 67*  --   --   --   --   GFRAA 57* 76* 78*  --   --   --   --   PROT  --   --   --   < > 6.0* 5.9* 6.0*  ALBUMIN  --   --   --   < > 3.6 3.6 3.6  AST  --   --   --   < > 18 19 18   ALT  --   --   --   < > 14 16 12   ALKPHOS  --   --   --   < > 86 76 84  BILITOT  --   --   --   < > 0.83 0.48 0.58  < > = values in this interval not displayed.  RADIOGRAPHIC STUDIES: I have personally reviewed the radiological images as listed and agreed with the findings in the report. Nm Pet Image Restag (ps) Skull Base To Thigh  07/27/2014   CLINICAL DATA:  Subsequent Treatment strategy for lymphoma. Restaging examination.  EXAM: NUCLEAR MEDICINE PET SKULL BASE TO THIGH  TECHNIQUE: 10.1 mCi F-18 FDG was injected intravenously. Full-ring PET imaging was performed from the skull base to thigh after the radiotracer. CT data was obtained and used for attenuation correction and anatomic localization.  FASTING BLOOD GLUCOSE:  Value: 83 mg/dl  COMPARISON:  PET-CT 08/22/2010.  FINDINGS: NECK  There is asymmetric soft tissue thickening in the posterior aspect of the nasopharynx bilaterally (left greater than right), which is hypermetabolic (SUVmax = 7.7 on the left and 5.2 on the right). There is also a hypermetabolism in the posterior spinal musculature at the skullbase, anterior spinal musculature on the left, and strap muscles on the left, presumably physiologic. No hypermetabolic lymph  nodes in the neck.  CHEST  Compared to prior examinations there are new multifocal areas of nodular pleural thickening which are hypermetabolic (SUVmax = 9.7)XY the thorax bilaterally (left greater than right). In addition, there is an irregular-shaped ovoid lesion in the right upper lobe (image 25 of series 6) measuring approximately 2.5 x 1.4 cm with several tiny satellite nodules which demonstrates some low-level hypermetabolic activity (SUVmax = 4.5). 1.3 cm short axis left hilar lymph node is hypermetabolic (SUVmax = 5.0). No other mediastinal or right hilar lymphadenopathy is noted. Small hiatal hernia with some hypermetabolism in the herniated portion of the cardia of the stomach (SUVmax = 4.6). The remainder the esophagus is otherwise unremarkable in appearance. Heart size is normal. There is no significant pericardial fluid, thickening or pericardial calcification. There is atherosclerosis of the thoracic aorta, the great vessels of the mediastinum and the coronary arteries, including calcified atherosclerotic plaque in the left main, left anterior descending, left circumflex and right coronary arteries. Status post median sternotomy for CABG, including LIMA to the LAD. Calcifications of the aortic valve. Right-sided internal jugular single-lumen porta cath with tip terminating in the distal superior vena cava.  ABDOMEN/PELVIS  Multiple other foci of hypermetabolism are noted within the gastrointestinal tract, including some amorphous hypermetabolism along the greater curvature of the fundus of the stomach, which corresponds to some mild focal gastric wall thickening (SUVmax = 6.6). Mildly enlarged 1 cm short axis gastrohepatic ligament lymph node demonstrates low-level hypermetabolism (SUVmax = 3.5). In addition, there is some focal thickening in the posterior colonic wall in the proximal descending colon, best appreciated on image 103 of series 4 where there is  hypermetabolism (SUVmax = 5.7). There is  also some mass like thickening of the cecum adjacent to the ileocecal valve (image 134 of series 4) measuring approximately 3.4 x 2.9 cm demonstrating hypermetabolism (SUVmax = 8.0). No abnormal hypermetabolic activity within the liver, pancreas, adrenal glands, or spleen. 3 mm calcification in the interpolar region of the left kidney is favored to be vascular, but could represent a nonobstructive calculus. Extensive atherosclerosis throughout the abdominal and pelvic vasculature, without evidence of aneurysm. Numerous colonic diverticulae are noted, particularly in the distal descending colon, without surrounding inflammatory changes to suggest an acute diverticulitis at this time. Moderate left inguinal hernia containing only fat incidentally noted.  SKELETON  No focal hypermetabolic activity to suggest skeletal metastasis. Status post left hip arthroplasty. Median sternotomy wires.  IMPRESSION: 1. Today's study demonstrates multifocal hypermetabolism throughout the neck, chest and abdomen, as detailed above. Sites are highly varied and include posterior nasopharyngeal mucosa (left greater than right), pleura, left hilar lymph node, gastric mucosa, gastrohepatic ligament lymph node and colonic lesions, as above. Recurrent lymphoma could certainly effect any or all of these regions, however, the appearance is somewhat unusual, and in a very different distribution than the patient's original disease which was predominantly extensive retroperitoneal and upper abdominal lymphadenopathy in 2008. Correlation with colonoscopy is suggested to evaluate for potential colonic primary. As well, upper endoscopy is suggested to evaluate the activity within the proximal stomach. 2. In addition to the multifocal pleural lesions, there is an irregular-shaped nodule in the periphery of the right upper lobe which demonstrates some hypermetabolism. Based on the appearance of this lesion, this is favored to be of infectious or  inflammatory etiology, however, a site of parenchymal infiltration with lymphoma or primary bronchogenic neoplasm is not excluded and close attention on followup studies is recommended. 3. Atherosclerosis, including left main and 3 vessel coronary artery disease. Status post median sternotomy for CABG, including LIMA to the LAD. 4. Colonic diverticulosis without findings to suggest acute diverticulitis at this time. 5. Additional incidental findings, as above.   Electronically Signed   By: Vinnie Langton M.D.   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 I recommend reviewing his case at the next hematology tumor board for treatment recommendations. The patient may need repeat biopsy. If repeat biopsy confirmed recurrence of mantle cell lymphoma, the current guidelines suggested treatment with Ibrutinib. I will call the patient next week for further discussion about treatment recommendation. In the meantime, I will cancel his chemotherapy today.  Anemia in neoplastic disease This is likely anemia of chronic disease and chronic kidney disease. The patient denies recent history of bleeding such as epistaxis, hematuria or hematochezia. He is asymptomatic from the anemia. We will observe for now.  He does not require transfusion now.    Chronic kidney disease (CKD), stage II (mild) This is chronic in nature, unlikely related to his cancer. Recommend close observation.   No orders of the defined types were placed in this encounter.    All questions were answered. The patient knows to call the clinic with any problems, questions or concerns. I spent 40 minutes counseling the patient face to face. The total time spent in the appointment was 60 minutes and more than 50% was on counseling.     [facility], [place], MD 08/01/2014 7:56 PM

## 2014-08-01 NOTE — Assessment & Plan Note (Signed)
This is likely anemia of chronic disease and chronic kidney disease. The patient denies recent history of bleeding such as epistaxis, hematuria or hematochezia. He is asymptomatic from the anemia. We will observe for now.  He does not require transfusion now.

## 2014-08-01 NOTE — Assessment & Plan Note (Signed)
The pattern of recurrence is highly unusual. I recommend reviewing his case at the next hematology tumor board for treatment recommendations. The patient may need repeat biopsy. If repeat biopsy confirmed recurrence of mantle cell lymphoma, the current guidelines suggested treatment with Ibrutinib. I will call the patient next week for further discussion about treatment recommendation. In the meantime, I will cancel his chemotherapy today.

## 2014-08-02 ENCOUNTER — Encounter (HOSPITAL_COMMUNITY): Payer: Self-pay | Admitting: Emergency Medicine

## 2014-08-02 ENCOUNTER — Encounter (HOSPITAL_COMMUNITY)
Admission: EM | Disposition: A | Discharge status: Home Health | Payer: Self-pay | Source: Home / Self Care | Attending: Internal Medicine

## 2014-08-02 ENCOUNTER — Other Ambulatory Visit: Payer: Self-pay

## 2014-08-02 ENCOUNTER — Telehealth: Payer: Self-pay | Admitting: Cardiology

## 2014-08-02 ENCOUNTER — Ambulatory Visit: Payer: Medicare Other

## 2014-08-02 ENCOUNTER — Emergency Department (HOSPITAL_COMMUNITY): Payer: Medicare Other

## 2014-08-02 ENCOUNTER — Inpatient Hospital Stay (HOSPITAL_COMMUNITY): Payer: Medicare Other

## 2014-08-02 ENCOUNTER — Inpatient Hospital Stay (HOSPITAL_COMMUNITY)
Admission: EM | Admit: 2014-08-02 | Discharge: 2014-08-12 | DRG: 326 | Disposition: A | Discharge status: Home Health | Payer: Medicare Other | Attending: Internal Medicine | Admitting: Internal Medicine

## 2014-08-02 ENCOUNTER — Telehealth: Payer: Self-pay | Admitting: *Deleted

## 2014-08-02 DIAGNOSIS — R0602 Shortness of breath: Secondary | ICD-10-CM

## 2014-08-02 DIAGNOSIS — K922 Gastrointestinal hemorrhage, unspecified: Secondary | ICD-10-CM | POA: Diagnosis present

## 2014-08-02 DIAGNOSIS — R578 Other shock: Secondary | ICD-10-CM | POA: Diagnosis present

## 2014-08-02 DIAGNOSIS — G4730 Sleep apnea, unspecified: Secondary | ICD-10-CM | POA: Diagnosis present

## 2014-08-02 DIAGNOSIS — C7889 Secondary malignant neoplasm of other digestive organs: Principal | ICD-10-CM | POA: Diagnosis present

## 2014-08-02 DIAGNOSIS — Z85238 Personal history of other malignant neoplasm of thymus: Secondary | ICD-10-CM | POA: Diagnosis not present

## 2014-08-02 DIAGNOSIS — Z923 Personal history of irradiation: Secondary | ICD-10-CM

## 2014-08-02 DIAGNOSIS — C8313 Mantle cell lymphoma, intra-abdominal lymph nodes: Secondary | ICD-10-CM | POA: Diagnosis present

## 2014-08-02 DIAGNOSIS — I361 Nonrheumatic tricuspid (valve) insufficiency: Secondary | ICD-10-CM | POA: Diagnosis present

## 2014-08-02 DIAGNOSIS — E876 Hypokalemia: Secondary | ICD-10-CM | POA: Diagnosis not present

## 2014-08-02 DIAGNOSIS — D631 Anemia in chronic kidney disease: Secondary | ICD-10-CM | POA: Diagnosis present

## 2014-08-02 DIAGNOSIS — M1990 Unspecified osteoarthritis, unspecified site: Secondary | ICD-10-CM | POA: Diagnosis present

## 2014-08-02 DIAGNOSIS — I878 Other specified disorders of veins: Secondary | ICD-10-CM | POA: Diagnosis present

## 2014-08-02 DIAGNOSIS — N182 Chronic kidney disease, stage 2 (mild): Secondary | ICD-10-CM | POA: Diagnosis present

## 2014-08-02 DIAGNOSIS — N179 Acute kidney failure, unspecified: Secondary | ICD-10-CM | POA: Diagnosis not present

## 2014-08-02 DIAGNOSIS — R571 Hypovolemic shock: Secondary | ICD-10-CM

## 2014-08-02 DIAGNOSIS — I6523 Occlusion and stenosis of bilateral carotid arteries: Secondary | ICD-10-CM | POA: Diagnosis present

## 2014-08-02 DIAGNOSIS — R519 Headache, unspecified: Secondary | ICD-10-CM

## 2014-08-02 DIAGNOSIS — R040 Epistaxis: Secondary | ICD-10-CM | POA: Diagnosis not present

## 2014-08-02 DIAGNOSIS — Z96642 Presence of left artificial hip joint: Secondary | ICD-10-CM | POA: Diagnosis present

## 2014-08-02 DIAGNOSIS — N390 Urinary tract infection, site not specified: Secondary | ICD-10-CM | POA: Diagnosis present

## 2014-08-02 DIAGNOSIS — R739 Hyperglycemia, unspecified: Secondary | ICD-10-CM | POA: Diagnosis present

## 2014-08-02 DIAGNOSIS — R55 Syncope and collapse: Secondary | ICD-10-CM | POA: Diagnosis present

## 2014-08-02 DIAGNOSIS — I10 Essential (primary) hypertension: Secondary | ICD-10-CM | POA: Diagnosis present

## 2014-08-02 DIAGNOSIS — K254 Chronic or unspecified gastric ulcer with hemorrhage: Secondary | ICD-10-CM | POA: Diagnosis present

## 2014-08-02 DIAGNOSIS — Z7902 Long term (current) use of antithrombotics/antiplatelets: Secondary | ICD-10-CM | POA: Diagnosis not present

## 2014-08-02 DIAGNOSIS — Z79899 Other long term (current) drug therapy: Secondary | ICD-10-CM

## 2014-08-02 DIAGNOSIS — R531 Weakness: Secondary | ICD-10-CM | POA: Diagnosis present

## 2014-08-02 DIAGNOSIS — G4733 Obstructive sleep apnea (adult) (pediatric): Secondary | ICD-10-CM | POA: Diagnosis present

## 2014-08-02 DIAGNOSIS — Z8572 Personal history of non-Hodgkin lymphomas: Secondary | ICD-10-CM

## 2014-08-02 DIAGNOSIS — Z8249 Family history of ischemic heart disease and other diseases of the circulatory system: Secondary | ICD-10-CM

## 2014-08-02 DIAGNOSIS — Z888 Allergy status to other drugs, medicaments and biological substances status: Secondary | ICD-10-CM | POA: Diagnosis not present

## 2014-08-02 DIAGNOSIS — R001 Bradycardia, unspecified: Secondary | ICD-10-CM | POA: Diagnosis present

## 2014-08-02 DIAGNOSIS — D62 Acute posthemorrhagic anemia: Secondary | ICD-10-CM | POA: Diagnosis present

## 2014-08-02 DIAGNOSIS — Z951 Presence of aortocoronary bypass graft: Secondary | ICD-10-CM | POA: Diagnosis not present

## 2014-08-02 DIAGNOSIS — Z87442 Personal history of urinary calculi: Secondary | ICD-10-CM

## 2014-08-02 DIAGNOSIS — D696 Thrombocytopenia, unspecified: Secondary | ICD-10-CM | POA: Diagnosis not present

## 2014-08-02 DIAGNOSIS — B9620 Unspecified Escherichia coli [E. coli] as the cause of diseases classified elsewhere: Secondary | ICD-10-CM | POA: Diagnosis present

## 2014-08-02 DIAGNOSIS — I2510 Atherosclerotic heart disease of native coronary artery without angina pectoris: Secondary | ICD-10-CM | POA: Diagnosis present

## 2014-08-02 DIAGNOSIS — I639 Cerebral infarction, unspecified: Secondary | ICD-10-CM | POA: Diagnosis not present

## 2014-08-02 DIAGNOSIS — Z7982 Long term (current) use of aspirin: Secondary | ICD-10-CM

## 2014-08-02 DIAGNOSIS — I959 Hypotension, unspecified: Secondary | ICD-10-CM | POA: Diagnosis present

## 2014-08-02 DIAGNOSIS — I739 Peripheral vascular disease, unspecified: Secondary | ICD-10-CM | POA: Diagnosis present

## 2014-08-02 DIAGNOSIS — J9620 Acute and chronic respiratory failure, unspecified whether with hypoxia or hypercapnia: Secondary | ICD-10-CM

## 2014-08-02 DIAGNOSIS — D689 Coagulation defect, unspecified: Secondary | ICD-10-CM

## 2014-08-02 DIAGNOSIS — C8318 Mantle cell lymphoma, lymph nodes of multiple sites: Secondary | ICD-10-CM | POA: Diagnosis present

## 2014-08-02 DIAGNOSIS — E785 Hyperlipidemia, unspecified: Secondary | ICD-10-CM | POA: Diagnosis present

## 2014-08-02 DIAGNOSIS — R600 Localized edema: Secondary | ICD-10-CM | POA: Diagnosis present

## 2014-08-02 DIAGNOSIS — I129 Hypertensive chronic kidney disease with stage 1 through stage 4 chronic kidney disease, or unspecified chronic kidney disease: Secondary | ICD-10-CM | POA: Diagnosis present

## 2014-08-02 DIAGNOSIS — C7931 Secondary malignant neoplasm of brain: Secondary | ICD-10-CM

## 2014-08-02 DIAGNOSIS — R51 Headache: Secondary | ICD-10-CM

## 2014-08-02 DIAGNOSIS — J9621 Acute and chronic respiratory failure with hypoxia: Secondary | ICD-10-CM | POA: Diagnosis not present

## 2014-08-02 DIAGNOSIS — R509 Fever, unspecified: Secondary | ICD-10-CM

## 2014-08-02 DIAGNOSIS — G9340 Encephalopathy, unspecified: Secondary | ICD-10-CM | POA: Diagnosis present

## 2014-08-02 DIAGNOSIS — C8590 Non-Hodgkin lymphoma, unspecified, unspecified site: Secondary | ICD-10-CM

## 2014-08-02 HISTORY — PX: ESOPHAGOGASTRODUODENOSCOPY: SHX5428

## 2014-08-02 LAB — CBC WITH DIFFERENTIAL/PLATELET
BASOS PCT: 0 % (ref 0–1)
Basophils Absolute: 0 10*3/uL (ref 0.0–0.1)
Eosinophils Absolute: 0.3 10*3/uL (ref 0.0–0.7)
Eosinophils Relative: 3 % (ref 0–5)
HEMATOCRIT: 25.1 % — AB (ref 39.0–52.0)
HEMOGLOBIN: 8.4 g/dL — AB (ref 13.0–17.0)
LYMPHS ABS: 2.5 10*3/uL (ref 0.7–4.0)
LYMPHS PCT: 28 % (ref 12–46)
MCH: 34 pg (ref 26.0–34.0)
MCHC: 33.5 g/dL (ref 30.0–36.0)
MCV: 101.6 fL — ABNORMAL HIGH (ref 78.0–100.0)
MONOS PCT: 10 % (ref 3–12)
Monocytes Absolute: 0.9 10*3/uL (ref 0.1–1.0)
NEUTROS PCT: 59 % (ref 43–77)
Neutro Abs: 5.2 10*3/uL (ref 1.7–7.7)
Platelets: 166 10*3/uL (ref 150–400)
RBC: 2.47 MIL/uL — AB (ref 4.22–5.81)
RDW: 13.6 % (ref 11.5–15.5)
WBC: 8.8 10*3/uL (ref 4.0–10.5)

## 2014-08-02 LAB — CBC
HCT: 23 % — ABNORMAL LOW (ref 39.0–52.0)
HCT: 25.1 % — ABNORMAL LOW (ref 39.0–52.0)
HCT: 27.5 % — ABNORMAL LOW (ref 39.0–52.0)
Hemoglobin: 7.4 g/dL — ABNORMAL LOW (ref 13.0–17.0)
Hemoglobin: 8.3 g/dL — ABNORMAL LOW (ref 13.0–17.0)
Hemoglobin: 9.2 g/dL — ABNORMAL LOW (ref 13.0–17.0)
MCH: 33.2 pg (ref 26.0–34.0)
MCH: 31.1 pg (ref 26.0–34.0)
MCH: 30.9 pg (ref 26.0–34.0)
MCHC: 32.2 g/dL (ref 30.0–36.0)
MCHC: 33.1 g/dL (ref 30.0–36.0)
MCHC: 33.5 g/dL (ref 30.0–36.0)
MCV: 103.1 fL — ABNORMAL HIGH (ref 78.0–100.0)
MCV: 94 fL (ref 78.0–100.0)
MCV: 92.3 fL (ref 78.0–100.0)
PLATELETS: 117 10*3/uL — AB (ref 150–400)
PLATELETS: 162 10*3/uL (ref 150–400)
Platelets: 124 10*3/uL — ABNORMAL LOW (ref 150–400)
RBC: 2.23 MIL/uL — ABNORMAL LOW (ref 4.22–5.81)
RBC: 2.67 MIL/uL — AB (ref 4.22–5.81)
RBC: 2.98 MIL/uL — ABNORMAL LOW (ref 4.22–5.81)
RDW: 13.7 % (ref 11.5–15.5)
RDW: 17.9 % — ABNORMAL HIGH (ref 11.5–15.5)
RDW: 18.2 % — AB (ref 11.5–15.5)
WBC: 6.1 10*3/uL (ref 4.0–10.5)
WBC: 6.6 10*3/uL (ref 4.0–10.5)
WBC: 7.8 10*3/uL (ref 4.0–10.5)

## 2014-08-02 LAB — POC OCCULT BLOOD, ED: Fecal Occult Bld: POSITIVE — AB

## 2014-08-02 LAB — BLOOD GAS, ARTERIAL
ACID-BASE DEFICIT: 1.6 mmol/L (ref 0.0–2.0)
Bicarbonate: 23 mEq/L (ref 20.0–24.0)
Drawn by: 331471
FIO2: 1 %
MECHVT: 600 mL
O2 Saturation: 99.9 %
PEEP: 5 cmH2O
PO2 ART: 325 mmHg — AB (ref 80.0–100.0)
Patient temperature: 98.6
RATE: 14 resp/min
TCO2: 22 mmol/L (ref 0–100)
pCO2 arterial: 41.3 mmHg (ref 35.0–45.0)
pH, Arterial: 7.365 (ref 7.350–7.450)

## 2014-08-02 LAB — COMPREHENSIVE METABOLIC PANEL
ALBUMIN: 2.6 g/dL — AB (ref 3.5–5.2)
ALK PHOS: 60 U/L (ref 39–117)
ALT: 8 U/L (ref 0–53)
AST: 14 U/L (ref 0–37)
Anion gap: 12 (ref 5–15)
BILIRUBIN TOTAL: 0.3 mg/dL (ref 0.3–1.2)
BUN: 33 mg/dL — ABNORMAL HIGH (ref 6–23)
CO2: 24 mEq/L (ref 19–32)
Calcium: 8.3 mg/dL — ABNORMAL LOW (ref 8.4–10.5)
Chloride: 106 mEq/L (ref 96–112)
Creatinine, Ser: 1.42 mg/dL — ABNORMAL HIGH (ref 0.50–1.35)
GFR calc Af Amer: 53 mL/min — ABNORMAL LOW (ref >90)
GFR calc non Af Amer: 45 mL/min — ABNORMAL LOW (ref >90)
GLUCOSE: 153 mg/dL — AB (ref 70–99)
POTASSIUM: 4.2 meq/L (ref 3.7–5.3)
Sodium: 142 mEq/L (ref 137–147)
Total Protein: 4.8 g/dL — ABNORMAL LOW (ref 6.0–8.3)

## 2014-08-02 LAB — MRSA PCR SCREENING: MRSA BY PCR: NEGATIVE

## 2014-08-02 LAB — GLUCOSE, CAPILLARY
GLUCOSE-CAPILLARY: 148 mg/dL — AB (ref 70–99)
GLUCOSE-CAPILLARY: 92 mg/dL (ref 70–99)

## 2014-08-02 LAB — ABO/RH: ABO/RH(D): A POS

## 2014-08-02 LAB — PREPARE RBC (CROSSMATCH)

## 2014-08-02 LAB — TROPONIN I

## 2014-08-02 SURGERY — EGD (ESOPHAGOGASTRODUODENOSCOPY)
Anesthesia: Moderate Sedation

## 2014-08-02 MED ORDER — FENTANYL CITRATE 0.05 MG/ML IJ SOLN
50.0000 ug | INTRAMUSCULAR | Status: DC | PRN
  Administered 2014-08-02 – 2014-08-03 (×2): 50 ug via INTRAVENOUS
  Filled 2014-08-02 (×2): qty 2

## 2014-08-02 MED ORDER — MIDAZOLAM HCL 10 MG/2ML IJ SOLN
INTRAMUSCULAR | Status: AC
  Filled 2014-08-02: qty 2

## 2014-08-02 MED ORDER — ACETAMINOPHEN 650 MG RE SUPP: 650.0000 mg | Freq: Four times a day (QID) | RECTAL | Status: DC | PRN

## 2014-08-02 MED ORDER — ONDANSETRON HCL 4 MG/2ML IJ SOLN: 4.0000 mg | Freq: Once | INTRAMUSCULAR | Status: DC

## 2014-08-02 MED ORDER — SODIUM CHLORIDE 0.9 % IV BOLUS (SEPSIS)
2000.0000 mL | Freq: Once | INTRAVENOUS | Status: AC
  Administered 2014-08-02: 2000 mL via INTRAVENOUS

## 2014-08-02 MED ORDER — ACETAMINOPHEN 325 MG PO TABS
650.0000 mg | ORAL_TABLET | Freq: Four times a day (QID) | ORAL | Status: DC | PRN
  Administered 2014-08-05 – 2014-08-09 (×6): 650 mg via ORAL
  Filled 2014-08-02 (×6): qty 2

## 2014-08-02 MED ORDER — CHLORHEXIDINE GLUCONATE 0.12 % MT SOLN
15.0000 mL | Freq: Two times a day (BID) | OROMUCOSAL | Status: DC
  Administered 2014-08-02 – 2014-08-04 (×5): 15 mL via OROMUCOSAL
  Filled 2014-08-02 (×5): qty 15

## 2014-08-02 MED ORDER — MIDAZOLAM HCL 10 MG/2ML IJ SOLN
INTRAMUSCULAR | Status: DC | PRN
Start: 2014-08-02 — End: 2014-08-02
  Administered 2014-08-02 (×2): 1 mg via INTRAVENOUS
  Administered 2014-08-02 (×2): 2 mg via INTRAVENOUS

## 2014-08-02 MED ORDER — FENTANYL CITRATE 0.05 MG/ML IJ SOLN
INTRAMUSCULAR | Status: DC | PRN
  Administered 2014-08-02 (×2): 25 ug via INTRAVENOUS

## 2014-08-02 MED ORDER — SODIUM CHLORIDE 0.9 % IV SOLN
8.0000 mg/h | INTRAVENOUS | Status: AC
  Administered 2014-08-02 – 2014-08-05 (×4): 8 mg/h via INTRAVENOUS
  Filled 2014-08-02 (×14): qty 80

## 2014-08-02 MED ORDER — FENTANYL CITRATE 0.05 MG/ML IJ SOLN
INTRAMUSCULAR | Status: AC
  Filled 2014-08-02: qty 2

## 2014-08-02 MED ORDER — SUCCINYLCHOLINE CHLORIDE 20 MG/ML IJ SOLN
INTRAMUSCULAR | Status: AC
  Filled 2014-08-02: qty 1

## 2014-08-02 MED ORDER — FENTANYL CITRATE 0.05 MG/ML IJ SOLN: 100.0000 ug | Freq: Once | INTRAMUSCULAR | Status: AC

## 2014-08-02 MED ORDER — SODIUM CHLORIDE 0.9 % IV SOLN
INTRAVENOUS | Status: DC
  Administered 2014-08-02 – 2014-08-10 (×8): via INTRAVENOUS

## 2014-08-02 MED ORDER — MIDAZOLAM HCL 2 MG/2ML IJ SOLN: 2.0000 mg | Freq: Once | INTRAMUSCULAR | Status: AC

## 2014-08-02 MED ORDER — NITROGLYCERIN 0.4 MG SL SUBL: 0.4000 mg | SUBLINGUAL_TABLET | SUBLINGUAL | Status: DC | PRN

## 2014-08-02 MED ORDER — SODIUM CHLORIDE 0.9 % IV SOLN
Freq: Once | INTRAVENOUS | Status: DC
Start: 2014-08-02 — End: 2014-08-02

## 2014-08-02 MED ORDER — CETYLPYRIDINIUM CHLORIDE 0.05 % MT LIQD
7.0000 mL | Freq: Four times a day (QID) | OROMUCOSAL | Status: DC
  Administered 2014-08-02 – 2014-08-04 (×5): 7 mL via OROMUCOSAL

## 2014-08-02 MED ORDER — SODIUM CHLORIDE 0.9 % IV SOLN: Freq: Once | INTRAVENOUS | Status: DC

## 2014-08-02 MED ORDER — ETOMIDATE 2 MG/ML IV SOLN
INTRAVENOUS | Status: AC
  Administered 2014-08-02: 20 mg
  Filled 2014-08-02: qty 20

## 2014-08-02 MED ORDER — LIDOCAINE HCL (CARDIAC) 20 MG/ML IV SOLN
INTRAVENOUS | Status: AC
  Filled 2014-08-02: qty 5

## 2014-08-02 MED ORDER — ONDANSETRON HCL 4 MG/2ML IJ SOLN
4.0000 mg | Freq: Four times a day (QID) | INTRAMUSCULAR | Status: DC | PRN
  Administered 2014-08-05: 4 mg via INTRAVENOUS
  Filled 2014-08-02: qty 2

## 2014-08-02 MED ORDER — ONDANSETRON HCL 4 MG PO TABS: 4.0000 mg | ORAL_TABLET | Freq: Four times a day (QID) | ORAL | Status: DC | PRN

## 2014-08-02 MED ORDER — ROCURONIUM BROMIDE 50 MG/5ML IV SOLN
INTRAVENOUS | Status: AC
  Filled 2014-08-02: qty 2

## 2014-08-02 MED ORDER — FENTANYL CITRATE 0.05 MG/ML IJ SOLN
50.0000 ug | INTRAMUSCULAR | Status: DC | PRN
  Administered 2014-08-02 – 2014-08-03 (×7): 50 ug via INTRAVENOUS
  Filled 2014-08-02 (×7): qty 2

## 2014-08-02 MED ORDER — MIDAZOLAM HCL 2 MG/2ML IJ SOLN
INTRAMUSCULAR | Status: AC
  Administered 2014-08-02: 2 mg
  Filled 2014-08-02: qty 4

## 2014-08-02 MED ORDER — ALBUTEROL (5 MG/ML) CONTINUOUS INHALATION SOLN
INHALATION_SOLUTION | RESPIRATORY_TRACT | Status: AC
Start: 2014-08-02 — End: 2014-08-02
  Filled 2014-08-02: qty 20

## 2014-08-02 MED ORDER — SODIUM CHLORIDE 0.9 % IV SOLN
80.0000 mg | Freq: Once | INTRAVENOUS | Status: AC
  Administered 2014-08-02: 80 mg via INTRAVENOUS
  Filled 2014-08-02: qty 80

## 2014-08-02 MED ORDER — FENTANYL CITRATE 0.05 MG/ML IJ SOLN
INTRAMUSCULAR | Status: AC
  Administered 2014-08-02: 100 ug
  Filled 2014-08-02: qty 2

## 2014-08-02 MED ORDER — PANTOPRAZOLE SODIUM 40 MG IV SOLR
40.0000 mg | Freq: Two times a day (BID) | INTRAVENOUS | Status: DC
  Administered 2014-08-06 – 2014-08-10 (×9): 40 mg via INTRAVENOUS
  Filled 2014-08-02 (×11): qty 40

## 2014-08-02 NOTE — Consult Note (Signed)
Referring Provider: [facility] Primary Care Physician:  [name], MD Primary Gastroenterologist:  Dr.Kaplan  Reason for Consultation:  Acute Major  GI bleed  HPI: Martin Morris is a 80 y.o. 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 He had undergone ERCP and stent placement at that time. Patient was treated and had no evidence of lymphoma for several years. He had restaging scans in April 2015 and was found to have evidence of recurrent lymphoma in the chest abdomen and pelvis. He just had a PET scan done 07/27/2014 with new multifocal areas of nodular pleural thickening which are hypermetabolic and an irregular shaped nodule in the periphery of the right upper lobe. Multi-multiple other foci of hypermetabolism noted within the GI tract including some amorphous hypermetabolism along the greater curvature of the stomach which corresponds to mild focal gastric wall thickening there is a and a large short axis gastrohepatic ligament lymph node and some focal thickening in the posterior colonic wall in the proximal descending colon also some masslike thickening of the cecum adjacent to the ileocecal valve measuring approximately 3.4 x 2.9 cm extensive atherosclerosis and numerous diverticuli. Per patient's wife he had not been complaining of any abdominal discomfort and nausea etc. but last evening became nauseated and then started vomiting up a large amount of bright red blood. He had several episodes at home and then also began passing bright red blood per rectum. He was hypotensive on arrival to the emergency room. Patient also has history of coronary artery disease is maintained on Plavix and aspirin and did take his Plavix last evening. Also with chronic kidney disease stage III, mild chronic anemia and coronary artery disease status post prior CABG. He had had a recent cath and is being managed  medically. CT of the head was also done through the emergency room last evening due to complaint of headache over the past couple of weeks and he may have early intracranial metastases. Hemoglobin was 11.8 on presentation and is down to the 8 range this morning platelets 120 Bun and 33 creatinine 1.42. Patient had a vasol vagal episode with vomiting earlier this morning her rate in the 20s and blood pressure down to 70 systolic. We have asked critical care to consult, and we'll focus on stabilization this morning and have also asked that he be intubated.   Past Medical History  Diagnosis Date  . Hypertension   . CAD 11/12/2007    a. s/p CABG;  b. Cath 7/11 Patent LIMA to the LAD. Previously known occlusion of a saphenous vein graft to diagonal and sequential to obtuse marginals. Severe diffuse native obtuse marginal and diagonal branch vessel disease. Preserved ejection fraction.;  c. 03/2013 [facility]: mild inflat and inf ischemia, EF 54%->initial med rx.  Marland Kitchen HYPERLIPIDEMIA 11/12/2007  . HYPERTENSION 11/12/2007  . PERIPHERAL VASCULAR DISEASE 11/12/2007  . ARTHRITIS 11/12/2007  . BENIGN PROSTATIC HYPERTROPHY, HX OF 11/12/2007  . Edema 10/17/2010  . INGUINAL HERNIAS, BILATERAL 11/10/2006  . Obesity, unspecified 07/05/2009  . SLEEP APNEA 11/12/2007  . History of PTCA 2005  . Osteoarthritis   . Carotid stenosis, bilateral   . Nephrolithiasis   . thymus ca dx'd 2003    Radiation comp 2003  . NHL (non-Hodgkin's lymphoma) dx'd 2008    Chemo comp 10/2010; rituxin comp 10/2010  . 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 MANTLE CELL LYMPHOMA INTRA-ABDOMINAL LYMPH NODES 11/12/2007  . Tricuspid valve regurgitation  Past Surgical History  Procedure Laterality Date  . Coronary artery bypass graft    . Cardiac catheterization  08/2009, 02/2010  . Total hip arthroplasty Left 12/09/2013    Procedure: TOTAL HIP ARTHROPLASTY;  Surgeon: Mauri Pole, MD;  Location: [facility];  Service: Orthopedics;  Laterality: Left;     Prior to Admission medications   Medication Sig Start Date End Date Taking? Authorizing Provider  acyclovir (ZOVIRAX) 400 MG tablet TAKE ONE TABLET BY MOUTH TWICE DAILY Patient taking differently: Take 400 mg by mouth daily. For 6 months then stop 06/20/14  Yes Aasim Marla Roe, MD  amLODipine (NORVASC) 5 MG tablet Take 1 tablet (5 mg total) by mouth daily. 01/12/14  Yes Minus Breeding, MD  aspirin 81 MG tablet Take 81 mg by mouth daily.    Yes Historical Provider, MD  atenolol (TENORMIN) 50 MG tablet Take 1 tablet (50 mg total) by mouth daily. 01/12/14  Yes Minus Breeding, MD  citalopram (CELEXA) 20 MG tablet TAKE ONE TABLET BY MOUTH ONCE DAILY Patient taking differently: Take 20 mg by mouth daily.  06/20/14  Yes Aasim Marla Roe, MD  clopidogrel (PLAVIX) 75 MG tablet Take 1 tablet (75 mg total) by mouth daily. 01/12/14  Yes Minus Breeding, MD  furosemide (LASIX) 20 MG tablet Take one daily as needed for fluid Patient taking differently: Take 20 mg by mouth as needed for fluid.  04/15/14  Yes Minus Breeding, MD  HYDROcodone-acetaminophen (NORCO/VICODIN) 5-325 MG per tablet Take 1-2 tablets by mouth every 6 (six) hours as needed for moderate pain. 12/10/13  Yes Lucille Passy Babish, PA-C  isosorbide mononitrate (IMDUR) 120 MG 24 hr tablet Take 1 tablet (120 mg total) by mouth daily. 01/12/14  Yes Minus Breeding, MD  lactose free nutrition (BOOST PLUS) LIQD Take 237 mLs by mouth 2 (two) times daily as needed (Allow w/pt or pt's wife request after diet advancement). 12/13/13  Yes Annita Brod, MD  lidocaine-prilocaine (EMLA) cream Apply 1 application topically See admin instructions. Apply to port 1 hour before treatment. Cover site with plastic wrap 03/24/14  Yes Concha Norway, MD  methocarbamol (ROBAXIN) 500 MG tablet Take 1 tablet (500 mg total) by mouth every 6 (six) hours as needed for muscle spasms. 12/10/13  Yes Lucille Passy Babish, PA-C  nitroGLYCERIN (NITROSTAT) 0.4 MG SL tablet  Place 1 tablet (0.4 mg total) under the tongue every 5 (five) minutes as needed for chest pain. 01/12/14  Yes Minus Breeding, MD  pravastatin (PRAVACHOL) 80 MG tablet Take 1 tablet (80 mg total) by mouth daily. 01/12/14  Yes Minus Breeding, MD  PRESCRIPTION MEDICATION Inject into the vein every 21 ( twenty-one) days. Velcade and Cytoxan.    Historical Provider, MD  sulfamethoxazole-trimethoprim (BACTRIM DS,SEPTRA DS) 800-160 MG per tablet Take 1 tablet by mouth every Monday, Wednesday, and Friday. Patient not taking: Reported on 08/02/2014 07/20/14   Aasim Marla Roe, MD    Current Facility-Administered Medications  Medication Dose Route Frequency Provider Last Rate Last Dose  . 0.9 %  sodium chloride infusion   Intravenous Continuous Rise Patience, MD 10 mL/hr at 08/02/14 0617    . acetaminophen (TYLENOL) tablet 650 mg  650 mg Oral Q6H PRN Rise Patience, MD       Or  . acetaminophen (TYLENOL) suppository 650 mg  650 mg Rectal Q6H PRN Rise Patience, MD      . albuterol (PROVENTIL, VENTOLIN) (5 MG/ML) 0.5% continuous inhalation solution           .  etomidate (AMIDATE) 2 MG/ML injection           . fentaNYL (SUBLIMAZE) 0.05 MG/ML injection           . lidocaine (cardiac) 100 mg/66ml (XYLOCAINE) 20 MG/ML injection 2%           . midazolam (VERSED) 2 MG/2ML injection           . nitroGLYCERIN (NITROSTAT) SL tablet 0.4 mg  0.4 mg Sublingual Q5 min PRN Rise Patience, MD      . ondansetron [facility]) tablet 4 mg  4 mg Oral Q6H PRN Rise Patience, MD       Or  . ondansetron [facility]) injection 4 mg  4 mg Intravenous Q6H PRN Rise Patience, MD      . pantoprazole (PROTONIX) 80 mg in sodium chloride 0.9 % 250 mL (0.32 mg/mL) infusion  8 mg/hr Intravenous Continuous Rise Patience, MD 25 mL/hr at 08/02/14 0646 8 mg/hr at 08/02/14 0646  . [START ON 08/05/2014] pantoprazole (PROTONIX) injection 40 mg  40 mg Intravenous Q12H Rise Patience, MD      . rocuronium  [facility]) 50 MG/5ML injection           . succinylcholine (ANECTINE) 20 MG/ML injection             Allergies as of 08/02/2014 - Review Complete 08/02/2014  Allergen Reaction Noted  . Atorvastatin Other (See Comments)     Family History  Problem Relation Age of Onset  . Coronary artery disease Mother     died at 68  . Heart attack Sister     died at age 61  . Heart attack Brother     History   Social History  . Marital Status: Married    Spouse Name: N/A    Number of Children: N/A  . Years of Education: N/A   Occupational History  . Not on file.   Social History Main Topics  . Smoking status: Never Smoker   . Smokeless tobacco: Never Used  . Alcohol Use: No  . Drug Use: No  . Sexual Activity: Not on file   Other Topics Concern  . Not on file   Social History Narrative   The patient lives in [place] with his wife. He is a retired Engineer, agricultural. HE is accompanied today by his wife and daughter. No tobacco or alcohol use.    Review of Systems: Pertinent positive and negative review of systems were noted in the above HPI section.  All other review of systems was otherwise negative.n.  Physical Exam: Vital signs in last 24 hours: Temp:  [97.4 F (36.3 C)-98.2 F (36.8 C)] 97.6 F (36.4 C) (12/08 0914) Pulse Rate:  [59-72] 61 (12/08 0914) Resp:  [13-22] 13 (12/08 0914) BP: (70-131)/(35-71) 93/52 mmHg (12/08 0914) SpO2:  [87 %-100 %] 100 % (12/08 0914) Weight:  [207 lb (93.895 kg)-207 lb 14.4 oz (94.303 kg)] 207 lb (93.895 kg) (12/08 0353) Last BM Date: 08/02/14 General:   Alert,  Well-developed, well-nourished,eldelry WM ,pale, alert, family at bedside Head:  Normocephalic and atraumatic. Eyes:  Sclera clear, no icterus.   Conjunctiva pale Ears:  Normal auditory acuity. Nose:  No deformity, discharge,  or lesions. Mouth:  No deformity or lesions.   Neck:  Supple; no masses or thyromegaly. Lungs:  Clear throughout to auscultation.   No wheezes, crackles, or  rhonchi. Heart:  Regular rate and rhythm; no murmurs, clicks, rubs,  or gallops. Abdomen:  Soft, obese,  BS+ no focal tenderness, no palp mass or HSM Rectal:  Deferred - vomited frank blood Msk:  Symmetrical without gross deformities. . Pulses:  Normal pulses noted. Extremities:  Without clubbing or edema. Neurologic:  Alert and  oriented x4;  grossly normal neurologically. Skin:  Intact without significant lesions or rashes.. Psych:  Alert and cooperative. Normal mood and affect.  Intake/Output from previous day: 12/07 0701 - 12/08 0700 In: 1113 [I.V.:1013; IV Piggyback:100] Out: -  Intake/Output this shift: Total I/O In: 445 [I.V.:70; Blood:375] Out: 75 [Urine:75]  Lab Results:  Recent Labs  08/01/14 1215 08/02/14 0431 08/02/14 0542  WBC 6.9 8.8 6.1  HGB 11.8* 8.4* 7.4*  HCT 36.3* 25.1* 23.0*  PLT 196 166 124*   BMET  Recent Labs  08/01/14 1216 08/02/14 0431  NA 143 142  K 3.8 4.2  CL  --  106  CO2 28 24  GLUCOSE 156* 153*  BUN 24.5 33*  CREATININE 1.6* 1.42*  CALCIUM 9.0 8.3*   LFT  Recent Labs  08/02/14 0431  PROT 4.8*  ALBUMIN 2.6*  AST 14  ALT 8  ALKPHOS 60  BILITOT 0.3   PT/INR No results for input(s): LABPROT, INR in the last 72 hours. Hepatitis Panel No results for input(s): HEPBSAG, HCVAB, HEPAIGM, HEPBIGM in the last 72 hours.   IMPRESSION:  #75  80 year old male with recurrent mantle cell lymphoma with extensive involvement of the chest abdomen and pelvis and possibly with intracranial metastases. Patient presents with an acute upper GI bleed in setting of chronic antiplatelet therapy with Plavix and aspirin. PET scan done one week ago showed abnormal soft tissue along the greater curvature of the stomach most likely representing metastatic disease to the stomach and this is likely the source of his bleed. #2 syncope and hypotension secondary to #1 #3 right kidney disease stage III #4 coronary artery disease status post CABG, recent  Abnormal being managed medically #5 sleep apnea #6 mild chronic anemia  PLAN: #1 Have involved critical care for help with management of this critically ill patient. ,would prefer him to be intubated prior to attempting EGD. #2 stabilized over the next few hours with blood and platelets #3 patient on a PPI infusion #4 Long  discussion with the family regarding current imaging findings, and suspicion that he is bleeding from a metastatic mass in the stomach. Endoscopic intervention is limited in the face of Plavix and aspirin. We will schedule for endoscopy later today for diagnostic purposes and possibly Endo Clip for control of bleeding. Patient may need IR involvement for angiogram and coiling. #5 Please involve Dr. Alvy Bimler /oncology.    Amy Esterwood  08/02/2014, 9:30 AM   GI ATTENDING  HISTORY,LABS,XRAYS,ENDOSCOPY REPORTS REVIEWED. PATIENT SEEN AND EXAMINED. AGREE WITH ABOVE. CRITICALLY ILL MAN WITH LIFE THREATENING GI BLEED, POSSIBLY DUE TO MALIGNANCY. WILL PROCEED WITH EMERGENT ENDOSCOPY. FAMILY AWARE.  Docia Chuck. Geri Seminole., M.D. [facility] Division of Gastroenterology

## 2014-08-02 NOTE — Op Note (Signed)
[facility] [place], [address]   ENDOSCOPY PROCEDURE REPORT  PATIENT: Martin Morris, Martin Morris  MR#: [id number] BIRTHDATE: [birth year] , 5  yrs. old GENDER: male ENDOSCOPIST: Eustace Quail, MD REFERRED BY:  [facility] PROCEDURE DATE:  08/02/2014 PROCEDURE:  EGD w/ control of bleeding   (endoclipped -3) ASA CLASS:     Class III INDICATIONS:  hematemesis, hemorrhagic shock.... ...PATIENT WITH KNOWN METASTATIC MANTLE CELL LYMPHOMA. MEDICATIONS: Intubated. Per nursing report TOPICAL ANESTHETIC: none  DESCRIPTION OF PROCEDURE: After the risks benefits and alternatives of the procedure were thoroughly explained, informed consent was obtained.  The Pentax Gastroscope [id number] endoscope was introduced through the mouth and advanced to the second portion of the duodenum , Without limitations.  The instrument was slowly withdrawn as the mucosa was fully examined.  EXAM:The double-channel therapeutic endoscope was passed into the esophagus, which was blood-filled.  After clearing the esophagus, which was normal except for an incidental distal esophageal ring, the stomach was entered.  There was a small hiatal hernia.  It was a large volume of fresh blood and clots replacing the proximal stomach.  The gastric body and antrum were cleared as was the duodenum.  With great labor (time, irrigation,suctioning, movement of patient position, Jabier Mutton net) the proximal stomach was cleared. The most significant finding was that of a 2 cm heaped up lesion with central ulcer consistent with lymphoma. This was located in the distal fundus on the greater curvature.  There was active bleeding.  The lesion was endoclipped -3 with hemostasis achieved. Retroflexed views revealed a hiatal hernia.     The scope was then withdrawn from the patient and the procedure completed.  COMPLICATIONS: There were no immediate complications.  ENDOSCOPIC IMPRESSION: 1. 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 Interventional radiology has been consulted for consideration of embolization therapy 2. Monitor for rebleeding and transfuse as needed 3. Continue PPI  and hold Plavix  I Discussed with patient's family in detail as well as interventional radiology.  REPEAT EXAM:  eSigned:  Eustace Quail, MD 08/02/2014 5:30 PM    OV:ZCHYIF [name], MD and The Patient  PATIENT NAME:  [name], [name] MR#: [id number]

## 2014-08-02 NOTE — ED Notes (Signed)
Report given to floor waiting on CT before moving to floor.

## 2014-08-02 NOTE — Telephone Encounter (Signed)
Received call from wife Olin Hauser wanting to inform Dr. Alvy Bimler re:  Pt had a bleed from the cancer in the stomach wall this am.  Pt is currently in intensive care at [facility].    Pamela's  Phone    [phone].

## 2014-08-02 NOTE — Consult Note (Signed)
PULMONARY / CRITICAL CARE MEDICINE   Name: Martin Morris MRN: [id number] DOB: 17-Sep-1932    ADMISSION DATE:  08/02/2014 CONSULTATION DATE:  12/9  REFERRING MD :  Charlies Silvers   CHIEF COMPLAINT:  Acute UGIB   INITIAL PRESENTATION:  80 year old male undergoing active chemo-therapy for Mantle cell lymphoma. Most recent PET scan demonstrated diffuse progression of disease (see study section below), most notably tumor in fundus of stomach. Admitted to [facility] on 12/8 w/ acute UGIB, treated w/ PPI gtt and blood x-fusion. PCCM asked to consult later that am due to bradycardia and syncopal event following episode of hematemesis.    STUDIES:  PET scan 12/2: most recent PET scan shows extensive hypermetabolic foci.. Including: area which likely represents tumor in the fundus of the stomach, descending colon and cecum . Also has hypermetabolism throughout the neck, chest and abdomen, as detailed above. Sites are highlyvaried and include posterior nasopharyngeal mucosa (left greater than right), pleura, left hilar lymph node, gastric mucosa, gastrohepatic ligament lymph node and colonic lesions CT head 12/8: Diffuse atrophy and small vessel ischemic changes. Early intracranial metastases are not excluded. No significant mass effect. No intracranial hemorrhage.  SIGNIFICANT EVENTS:    HISTORY OF PRESENT ILLNESS:   This is a 80 y.o. male with history of metastatic lymphoma, CAD, hypertension, hyperlipidemia and chronic lower extremity edema presented to the ER on 12/8 because of throwing up blood and had loss consciousness. Patient was brought to the ER and while there ER  had another episode of hematemesis. He was initially hypotensive and was given 2 L normal saline bolus following which patient's blood pressure improved. Patient's hemoglobin showed at least 4 g drop from previous. Hemoglobin fell from 11.8-7.4. Patient was on aspirin and Plavix for CAD.He denied any chest pain or shortness of breath or  abdominal pain. Had been noticing some black stools over the last 2 days.Admitted to SDU acute GI bleed. Most recent PET scan showed significant progression of his disease including what appears to be a mass in the stomach.  He is being followed by Dr. Alvy Bimler. Had another event the AM of 12/8 where he had a large bloody BM followed by hematemesis, then transient bradycardia and decreased LOC. PCCM was asked to see given concern about active bleeding.    PAST MEDICAL HISTORY :   has a past medical history of Hypertension; CAD (11/12/2007); HYPERLIPIDEMIA (11/12/2007); HYPERTENSION (11/12/2007); PERIPHERAL VASCULAR DISEASE (11/12/2007); ARTHRITIS (11/12/2007); BENIGN PROSTATIC HYPERTROPHY, HX OF (11/12/2007); Edema (10/17/2010); INGUINAL HERNIAS, BILATERAL (11/10/2006); Obesity, unspecified (07/05/2009); SLEEP APNEA (11/12/2007); History of PTCA (2005); Osteoarthritis; Carotid stenosis, bilateral; Nephrolithiasis; thymus ca (dx'd 2003); NHL (non-Hodgkin's lymphoma) (dx'd 2008); CANCER, THYMUS (11/12/2007); MANTLE CELL LYMPHOMA INTRA-ABDOMINAL LYMPH NODES (11/12/2007); and Tricuspid valve regurgitation.  has past surgical history that includes Coronary artery bypass graft; Cardiac catheterization (08/2009, 02/2010); and Total hip arthroplasty (Left, 12/09/2013). Prior to Admission medications   Medication Sig Start Date End Date Taking? Authorizing Provider  acyclovir (ZOVIRAX) 400 MG tablet TAKE ONE TABLET BY MOUTH TWICE DAILY Patient taking differently: Take 400 mg by mouth daily. For 6 months then stop 06/20/14  Yes Aasim Marla Roe, MD  amLODipine (NORVASC) 5 MG tablet Take 1 tablet (5 mg total) by mouth daily. 01/12/14  Yes Minus Breeding, MD  aspirin 81 MG tablet Take 81 mg by mouth daily.    Yes Historical Provider, MD  atenolol (TENORMIN) 50 MG tablet Take 1 tablet (50 mg total) by mouth daily. 01/12/14  Yes Minus Breeding, MD  citalopram (CELEXA) 20 MG tablet TAKE ONE TABLET BY MOUTH ONCE DAILY Patient  taking differently: Take 20 mg by mouth daily.  06/20/14  Yes Aasim Marla Roe, MD  clopidogrel (PLAVIX) 75 MG tablet Take 1 tablet (75 mg total) by mouth daily. 01/12/14  Yes Minus Breeding, MD  furosemide (LASIX) 20 MG tablet Take one daily as needed for fluid Patient taking differently: Take 20 mg by mouth as needed for fluid.  04/15/14  Yes Minus Breeding, MD  HYDROcodone-acetaminophen (NORCO/VICODIN) 5-325 MG per tablet Take 1-2 tablets by mouth every 6 (six) hours as needed for moderate pain. 12/10/13  Yes Lucille Passy Babish, PA-C  isosorbide mononitrate (IMDUR) 120 MG 24 hr tablet Take 1 tablet (120 mg total) by mouth daily. 01/12/14  Yes Minus Breeding, MD  lactose free nutrition (BOOST PLUS) LIQD Take 237 mLs by mouth 2 (two) times daily as needed (Allow w/pt or pt's wife request after diet advancement). 12/13/13  Yes Annita Brod, MD  lidocaine-prilocaine (EMLA) cream Apply 1 application topically See admin instructions. Apply to port 1 hour before treatment. Cover site with plastic wrap 03/24/14  Yes Concha Norway, MD  methocarbamol (ROBAXIN) 500 MG tablet Take 1 tablet (500 mg total) by mouth every 6 (six) hours as needed for muscle spasms. 12/10/13  Yes Lucille Passy Babish, PA-C  nitroGLYCERIN (NITROSTAT) 0.4 MG SL tablet Place 1 tablet (0.4 mg total) under the tongue every 5 (five) minutes as needed for chest pain. 01/12/14  Yes Minus Breeding, MD  pravastatin (PRAVACHOL) 80 MG tablet Take 1 tablet (80 mg total) by mouth daily. 01/12/14  Yes Minus Breeding, MD  PRESCRIPTION MEDICATION Inject into the vein every 21 ( twenty-one) days. Velcade and Cytoxan.    Historical Provider, MD  sulfamethoxazole-trimethoprim (BACTRIM DS,SEPTRA DS) 800-160 MG per tablet Take 1 tablet by mouth every Monday, Wednesday, and Friday. Patient not taking: Reported on 08/02/2014 07/20/14   Aasim Marla Roe, MD   Allergies  Allergen Reactions  . Atorvastatin Other (See Comments)    Incredible dizziness     FAMILY HISTORY:  indicated that his mother is deceased. He indicated that his father is alive.  SOCIAL HISTORY:  reports that he has never smoked. He has never used smokeless tobacco. He reports that he does not drink alcohol or use illicit drugs.  REVIEW OF SYSTEMS:   Per HPI  SUBJECTIVE:  Not in acute distress.  VITAL SIGNS: Temp:  [97.4 F (36.3 C)-98.2 F (36.8 C)] 97.5 F (36.4 C) (12/08 0803) Pulse Rate:  [59-72] 66 (12/08 0803) Resp:  [13-22] 13 (12/08 0803) BP: (77-131)/(41-71) 89/46 mmHg (12/08 0803) SpO2:  [87 %-100 %] 99 % (12/08 0803) Weight:  [93.895 kg (207 lb)-94.303 kg (207 lb 14.4 oz)] 93.895 kg (207 lb) (12/08 0353) HEMODYNAMICS:   VENTILATOR SETTINGS:   INTAKE / OUTPUT:  Intake/Output Summary (Last 24 hours) at 08/02/14 0847 Last data filed at 08/02/14 0800  Gross per 24 hour  Intake 1177.99 ml  Output      0 ml  Net 1177.99 ml    PHYSICAL EXAMINATION: General:  80 year old male, slightly pale  Neuro:  Awake, oriented  HEENT:  Awake, alert, no focal def  Cardiovascular:  rrr Lungs:  Clear  Abdomen:  Soft, non-tender + bowel sound  Musculoskeletal:  weak Skin:  Bilateral LE edema w/ chronic venous stasis changes   LABS:  CBC  Recent Labs Lab 08/01/14 1215 08/02/14 0431 08/02/14 0542  WBC 6.9 8.8 6.1  HGB  11.8* 8.4* 7.4*  HCT 36.3* 25.1* 23.0*  PLT 196 166 124*   Coag's No results for input(s): APTT, INR in the last 168 hours. BMET  Recent Labs Lab 08/01/14 1216 08/02/14 0431  NA 143 142  K 3.8 4.2  CL  --  106  CO2 28 24  BUN 24.5 33*  CREATININE 1.6* 1.42*  GLUCOSE 156* 153*   Electrolytes  Recent Labs Lab 08/01/14 1216 08/02/14 0431  CALCIUM 9.0 8.3*   Sepsis Markers No results for input(s): LATICACIDVEN, PROCALCITON, O2SATVEN in the last 168 hours. ABG No results for input(s): PHART, PCO2ART, PO2ART in the last 168 hours. Liver Enzymes  Recent Labs Lab 08/01/14 1216 08/02/14 0431  AST 18 14  ALT 12  8  ALKPHOS 84 60  BILITOT 0.58 0.3  ALBUMIN 3.6 2.6*   Cardiac Enzymes  Recent Labs Lab 08/02/14 0542  TROPONINI <0.30   Glucose  Recent Labs Lab 07/27/14 1215  GLUCAP 83    Imaging No results found.   ASSESSMENT / PLAN:  PULMONARY OETT A:  H/o OSA P:   Supportive care Plan for intubation and airway support for EGD   CARDIOVASCULAR CVL: right port  A:  Symptomatic bradycardia in setting of syncopal event Hemorrhagic shock  P:  Volume resuscitate Hold all antihypertensives Cont tele monitoring  RENAL A:   CKD stage II w/ acute on chronic AKI (baseline scr 1.4 range). Improved since admit  P:   Avoid hypotension Hold antihypertensives Strict I&O  GASTROINTESTINAL A:   Acute UGIB. prob gastric Mass  Epistaxis  P:   GI consult pending PPI gtt See heme section   HEMATOLOGIC A:   Mantle cell lymphoma of intra-abd LNs stage IV w/ disease progression (see PET scan report above) Chronic anemia; Macrocytic  Acute blood loss anemia  Mild thrombocytopenia P:  Transfuse for hgb goal of at least > 8 in setting of active bleeding Hold ASA and plavix  Transfuse platelets  See GI section    INFECTIOUS A: no acute  P:   Trend fever curve  ENDOCRINE A:  Mild hyperglycemia  P:   ssi protocol   NEUROLOGIC A:   Transient acute encephalopathy (due to syncopal event w/ GIB)-->resolved.  Early brain mets  P:   RASS goal: na Supportive care  Hold all sedating meds    FAMILY  - Updates: family updated at length on plan of care 12/8  - Inter-disciplinary family meet or Palliative Care meeting due by:  12/13  Erick Colace ACNP-BC [place] Pager # [phone] OR # [phone] if no answer    ATTENDING NOTE: I have personally reviewed patient's available data, including medical history, events of note, physical examination and test results as part of my evaluation. I have discussed with resident/NP and other careteam  providers such as pharmacist, RN and RRT & co-ordinated with consultants. In addition, I personally evaluated patient and elicited key history of vomiting blood with brady & near syncope, exam findings of clear lungs, s1s2 nml & labs showing acute blood loss anemia.  Recent PEt reviewed - disease progression noted, with PET + lung nodule   TODAY'S MD SUMMARY: Intubated for airway protection to enable EGD, this does appear to be tumor involving the stomach, if unable to treat endoscopically, will need IR embolisation Rest per NP/medical resident whose note is outlined above and that I agree with and edited in full.    The patient is critically ill with multiple organ systems failure  and requires high complexity decision making for assessment and support, frequent evaluation and titration of therapies, application of advanced monitoring technologies and extensive interpretation of multiple databases. Critical Care Time devoted to patient care services described in this note independent of APP time is 50 minutes.    Rigoberto Noel MD   08/02/2014, 8:47 AM

## 2014-08-02 NOTE — ED Notes (Signed)
Pt placed on cardiac monitoring.  

## 2014-08-02 NOTE — Progress Notes (Signed)
Pt had a bloody bowel movement at shift change.  Shortly after he started to vomit and became unresponsive for a short period.  Pt was able to vomit a very large dark red clot and was slightly wheezing after he vomited.  During the vomiting period while he was becoming unresponsive, his HR dropped to the 20s and B/P was 70/35. Pt shortly after became arousable and his HR came back to the 60s quickly.  Oxygen Sats stayed in 90s during vomiting episode.  MD notified. Blood started. Will continue to monitor pt.

## 2014-08-02 NOTE — ED Notes (Addendum)
Pt arrived via EMS on stretcher with report of GI bleed upper and lower. Pt color pale. Spouse at bedside and reported that pt gotten up to go to [facility] and passed out. Noted N/V x1 of bloody emesis. Pt was given NS 54ml bolus via EMS d/t hypotension with effectiveness noted.  EMS reported that pt was his Lymphoma has gotten worse. Pt receiving chemotherapy weekly.

## 2014-08-02 NOTE — ED Notes (Signed)
Bed: KP22 Expected date:  Expected time:  Means of arrival:  Comments: EMS 80 yo male with GI bleed

## 2014-08-02 NOTE — ED Provider Notes (Signed)
CSN: [id number]     Arrival date & time 08/02/14  0351 History   First MD Initiated Contact with Patient 08/02/14 0415     Chief Complaint  Patient presents with  . GI Bleeding     (Consider location/radiation/quality/duration/timing/severity/associated sxs/prior Treatment) HPI  Martin Morris is a 80 y.o. male with PMH of metastatic lymphoma to his neck, lungs, stomach and colon, here with GI bleed.  History was obtained from wife due to the acuity of his condition.  Tonight patient began vomiting multiple episodes of bright red blood and also passing bright red blood per rectum. He states over the past couple days he has had dark black stool. He denies any history of this previously. He denies any fevers or recent infections. Patient admits to being weak and tired and lightheaded.      Past Medical History  Diagnosis Date  . Hypertension   . CAD 11/12/2007    a. s/p CABG;  b. Cath 7/11 Patent LIMA to the LAD. Previously known occlusion of a saphenous vein graft to diagonal and sequential to obtuse marginals. Severe diffuse native obtuse marginal and diagonal branch vessel disease. Preserved ejection fraction.;  c. 03/2013 Lexi CL: mild inflat and inf ischemia, EF 54%->initial med rx.  Marland Kitchen HYPERLIPIDEMIA 11/12/2007  . HYPERTENSION 11/12/2007  . PERIPHERAL VASCULAR DISEASE 11/12/2007  . ARTHRITIS 11/12/2007  . BENIGN PROSTATIC HYPERTROPHY, HX OF 11/12/2007  . Edema 10/17/2010  . INGUINAL HERNIAS, BILATERAL 11/10/2006  . Obesity, unspecified 07/05/2009  . SLEEP APNEA 11/12/2007  . History of PTCA 2005  . Osteoarthritis   . Carotid stenosis, bilateral   . Nephrolithiasis   . thymus ca dx'd 2003    Radiation comp 2003  . NHL (non-Hodgkin's lymphoma) dx'd 2008    Chemo comp 10/2010; rituxin comp 10/2010  . 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 MANTLE CELL LYMPHOMA INTRA-ABDOMINAL LYMPH NODES 11/12/2007  . Tricuspid valve regurgitation    Past Surgical History  Procedure Laterality Date  . Coronary  artery bypass graft    . Cardiac catheterization  08/2009, 02/2010  . Total hip arthroplasty Left 12/09/2013    Procedure: TOTAL HIP ARTHROPLASTY;  Surgeon: Mauri Pole, MD;  Location: [facility] ORS;  Service: Orthopedics;  Laterality: Left;   Family History  Problem Relation Age of Onset  . Coronary artery disease Mother     died at 20  . Heart attack Sister     died at age 8  . Heart attack Brother    History  Substance Use Topics  . Smoking status: Never Smoker   . Smokeless tobacco: Never Used  . Alcohol Use: No    Review of Systems  Unable to perform ROS: Acuity of condition      Allergies  Atorvastatin  Home Medications   Prior to Admission medications   Medication Sig Start Date End Date Taking? Authorizing Provider  acyclovir (ZOVIRAX) 400 MG tablet TAKE ONE TABLET BY MOUTH TWICE DAILY Patient taking differently: Take 400 mg by mouth daily. For 6 months then stop 06/20/14  Yes Aasim Marla Roe, MD  amLODipine (NORVASC) 5 MG tablet Take 1 tablet (5 mg total) by mouth daily. 01/12/14  Yes Minus Breeding, MD  aspirin 81 MG tablet Take 81 mg by mouth daily.    Yes Historical Provider, MD  atenolol (TENORMIN) 50 MG tablet Take 1 tablet (50 mg total) by mouth daily. 01/12/14  Yes Minus Breeding, MD  citalopram (CELEXA) 20 MG tablet TAKE ONE TABLET BY MOUTH ONCE  DAILY Patient taking differently: Take 20 mg by mouth daily.  06/20/14  Yes Aasim Marla Roe, MD  clopidogrel (PLAVIX) 75 MG tablet Take 1 tablet (75 mg total) by mouth daily. 01/12/14  Yes Minus Breeding, MD  furosemide (LASIX) 20 MG tablet Take one daily as needed for fluid Patient taking differently: Take 20 mg by mouth as needed for fluid.  04/15/14  Yes Minus Breeding, MD  HYDROcodone-acetaminophen (NORCO/VICODIN) 5-325 MG per tablet Take 1-2 tablets by mouth every 6 (six) hours as needed for moderate pain. 12/10/13  Yes Lucille Passy Babish, PA-C  isosorbide mononitrate (IMDUR) 120 MG 24 hr tablet Take 1  tablet (120 mg total) by mouth daily. 01/12/14  Yes Minus Breeding, MD  lactose free nutrition (BOOST PLUS) LIQD Take 237 mLs by mouth 2 (two) times daily as needed (Allow w/pt or pt's wife request after diet advancement). 12/13/13  Yes Annita Brod, MD  lidocaine-prilocaine (EMLA) cream Apply 1 application topically See admin instructions. Apply to port 1 hour before treatment. Cover site with plastic wrap 03/24/14  Yes Concha Norway, MD  methocarbamol (ROBAXIN) 500 MG tablet Take 1 tablet (500 mg total) by mouth every 6 (six) hours as needed for muscle spasms. 12/10/13  Yes Lucille Passy Babish, PA-C  nitroGLYCERIN (NITROSTAT) 0.4 MG SL tablet Place 1 tablet (0.4 mg total) under the tongue every 5 (five) minutes as needed for chest pain. 01/12/14  Yes Minus Breeding, MD  pravastatin (PRAVACHOL) 80 MG tablet Take 1 tablet (80 mg total) by mouth daily. 01/12/14  Yes Minus Breeding, MD  PRESCRIPTION MEDICATION Inject into the vein every 21 ( twenty-one) days. Velcade and Cytoxan.    Historical Provider, MD  sulfamethoxazole-trimethoprim (BACTRIM DS,SEPTRA DS) 800-160 MG per tablet Take 1 tablet by mouth every Monday, Wednesday, and Friday. Patient not taking: Reported on 08/02/2014 07/20/14   Aasim Marla Roe, MD   BP 131/62 mmHg  Pulse 59  Temp(Src) 97.4 F (36.3 C) (Rectal)  Resp 22  Ht 5\' 8"  (1.727 m)  Wt 207 lb (93.895 kg)  BMI 31.48 kg/m2  SpO2 87% Physical Exam  Constitutional: Vital signs are normal. He appears well-developed and well-nourished.  Non-toxic appearance. He does not appear ill. He appears distressed.  HENT:  Head: Normocephalic and atraumatic.  Nose: Nose normal.  Mouth/Throat: No oropharyngeal exudate.  Dried blood seen in oropharynx.  Eyes: EOM are normal. Pupils are equal, round, and reactive to light. No scleral icterus.  Pale conjunctiva bilaterally.  Neck: Normal range of motion. Neck supple. No tracheal deviation, no edema, no erythema and normal range of motion  present. No thyroid mass and no thyromegaly present.  Cardiovascular: Normal rate, regular rhythm, S1 normal, S2 normal, normal heart sounds, intact distal pulses and normal pulses.  Exam reveals no gallop and no friction rub.   No murmur heard. Pulses:      Radial pulses are 2+ on the right side, and 2+ on the left side.       Dorsalis pedis pulses are 2+ on the right side, and 2+ on the left side.  Pulmonary/Chest: Effort normal and breath sounds normal. No respiratory distress. He has no wheezes. He has no rhonchi. He has no rales.  Abdominal: Soft. Normal appearance and bowel sounds are normal. He exhibits no distension, no ascites and no mass. There is no hepatosplenomegaly. There is tenderness. There is no rebound, no guarding and no CVA tenderness.  Diffuse tenderness to palpation.  Musculoskeletal: Normal range of motion. He exhibits  no edema or tenderness.  Lymphadenopathy:    He has no cervical adenopathy.  Neurological: He has normal strength. No sensory deficit. He exhibits normal muscle tone. GCS eye subscore is 4. GCS verbal subscore is 5. GCS motor subscore is 6.  Drowsy but arousable.  Skin: Skin is warm, dry and intact. No petechiae and no rash noted. He is not diaphoretic. No erythema. No pallor.  Psychiatric: He has a normal mood and affect. His behavior is normal. Judgment normal.  Nursing note and vitals reviewed.   ED Course  Procedures (including critical care time) Labs Review Labs Reviewed  CBC WITH DIFFERENTIAL - Abnormal; Notable for the following:    RBC 2.47 (*)    Hemoglobin 8.4 (*)    HCT 25.1 (*)    MCV 101.6 (*)    All other components within normal limits  COMPREHENSIVE METABOLIC PANEL - Abnormal; Notable for the following:    Glucose, Bld 153 (*)    BUN 33 (*)    Creatinine, Ser 1.42 (*)    Calcium 8.3 (*)    Total Protein 4.8 (*)    Albumin 2.6 (*)    GFR calc non Af Amer 45 (*)    GFR calc Af Amer 53 (*)    All other components within normal  limits  POC OCCULT BLOOD, ED - Abnormal; Notable for the following:    Fecal Occult Bld POSITIVE (*)    All other components within normal limits  CBC  TROPONIN I  TYPE AND SCREEN  PREPARE RBC (CROSSMATCH)  ABO/RH    Imaging Review Dg Chest Port 1 View  08/02/2014   CLINICAL DATA:  GI bleed, history of lymphoma and weakness.  EXAM: PORTABLE CHEST - 1 VIEW  COMPARISON:  PET-CT July 27, 2014 and chest radiograph December 09, 2013  FINDINGS: Patient is rotated to the RIGHT. Cardiomediastinal silhouette is nonsuspicious, mildly calcified aortic knob. Status post median sternotomy for apparent CABG. Bandlike density in LEFT lung base persists, no pleural effusions. No focal consolidation. No pneumothorax.  Single lumen RIGHT chest Port-A-Cath with distal tip projecting in mid superior vena cava, unchanged. Soft tissue planes and included osseous structures are nonsuspicious; scoliosis.  IMPRESSION: Stable LEFT lung base atelectasis versus scarring.   Electronically Signed   By: Elon Alas   On: 08/02/2014 05:10     EKG Interpretation None      MDM   Final diagnoses:  Weakness    Patient presents emergency department for GI bleed. Upon my evaluation the patient he was lightheaded, drowsy and blood pressure on the monitor read 50/30. Patient had dried blood around his chin and neck. He also had one episode of bright red blood emesis while I was in the room. Nursing staff was called in the room, second IV was obtained and patient was bolused 2 L IV fluid.  Type and cross was sent for 2 units of blood. Patient was transferred to the resuscitation room. Will consider intubation if he continues to vomit blood.  Patient will be admitted to the stepdown unit for continued treatment.    CRITICAL CARE Performed by: Everlene Balls   Total critical care time: 40 min.  Critical care time was exclusive of separately billable procedures and treating other patients.  Critical care was necessary  to treat or prevent imminent or life-threatening deterioration.  Critical care was time spent personally by me on the following activities: development of treatment plan with patient and/or surrogate as well as nursing, discussions with  consultants, evaluation of patient's response to treatment, examination of patient, obtaining history from patient or surrogate, ordering and performing treatments and interventions, ordering and review of laboratory studies, ordering and review of radiographic studies, pulse oximetry and re-evaluation of patient's condition.     Everlene Balls, MD 08/02/14 [phone]

## 2014-08-02 NOTE — Progress Notes (Signed)
IR team aware of this pt with UGI bleed felt to be coming from metastatic tumor spread in the stomach. S/p endoscopy with clipping. Hx Lymphoma with recent PET findings noted. Pt was seen by Oncology yesterday, no sxs, now admitted with hematemesis. Hx PAD and CAD on daily Plavix including yesterday.  IR aware of possible need for angiogram and embolization. Do not see Endo report yet but if the area clipped correlates with PET findings, this may indicate bleeding from the left gastric branches. IR team available if persistent or recurrent bleeding and family decides to continue aggressive therapy.  Martin Dike PA-C Interventional Radiology 08/02/2014 4:31 PM

## 2014-08-02 NOTE — Telephone Encounter (Signed)
Pt's wife called in wanting to let Dr.Hochrein know that the pt was hospitalized due to some internal bleeding in his stomach. She just wanted to make him aware in case some adjustments to his meds needed to be made.  Thanks

## 2014-08-02 NOTE — H&P (Signed)
[facility] History and Physical  Martin Morris OFB:[id number] DOB: 1932-12-30 DOA: 08/02/2014  Referring physician: ER physician. PCP: [name], MD   Chief Complaint: Throwing up blood.  HPI: Martin Morris is a 80 y.o. male with history of metastatic lymphoma, CAD, hypertension, hyperlipidemia and chronic lower extremity edema presented to the ER because of throwing up blood and had loss consciousness. Last evening patient threw up blood and when tried to walk lost consciousness for a brief episode. Patient was brought to the ER and ER patient had another episode of hematemesis or frank blood. Patient was initially hypotensive and was given 2 L normal saline bolus following which patient's blood pressure improved. Patient's hemoglobin shows at least 4 g drop from previous. Hemoglobin fell from 11.8-7.4. Patient is on aspirin and Plavix for CAD. Patient denies any chest pain or shortness of breath or abdominal pain. Patient has been noticing some black stools over the last 2 days. Patient is admitted for acute GI bleed. Patient has been having headache for last few days with neck pain. As per patient his lymphoma has progressed and is being followed by Dr. Alvy Bimler.  Review of Systems: As presented in the history of presenting illness, rest negative.  Past Medical History  Diagnosis Date  . Hypertension   . CAD 11/12/2007    a. s/p CABG;  b. Cath 7/11 Patent LIMA to the LAD. Previously known occlusion of a saphenous vein graft to diagonal and sequential to obtuse marginals. Severe diffuse native obtuse marginal and diagonal branch vessel disease. Preserved ejection fraction.;  c. 03/2013 Lexi CL: mild inflat and inf ischemia, EF 54%->initial med rx.  Marland Kitchen HYPERLIPIDEMIA 11/12/2007  . HYPERTENSION 11/12/2007  . PERIPHERAL VASCULAR DISEASE 11/12/2007  . ARTHRITIS 11/12/2007  . BENIGN PROSTATIC HYPERTROPHY, HX OF 11/12/2007  . Edema 10/17/2010  . INGUINAL HERNIAS, BILATERAL  11/10/2006  . Obesity, unspecified 07/05/2009  . SLEEP APNEA 11/12/2007  . History of PTCA 2005  . Osteoarthritis   . Carotid stenosis, bilateral   . Nephrolithiasis   . thymus ca dx'd 2003    Radiation comp 2003  . NHL (non-Hodgkin's lymphoma) dx'd 2008    Chemo comp 10/2010; rituxin comp 10/2010  . 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 MANTLE CELL LYMPHOMA INTRA-ABDOMINAL LYMPH NODES 11/12/2007  . Tricuspid valve regurgitation    Past Surgical History  Procedure Laterality Date  . Coronary artery bypass graft    . Cardiac catheterization  08/2009, 02/2010  . Total hip arthroplasty Left 12/09/2013    Procedure: TOTAL HIP ARTHROPLASTY;  Surgeon: Mauri Pole, MD;  Location: [facility];  Service: Orthopedics;  Laterality: Left;   Social History:  reports that he has never smoked. He has never used smokeless tobacco. He reports that he does not drink alcohol or use illicit drugs. Where does patient live home. Can patient participate in ADLs? Yes.  Allergies  Allergen Reactions  . Atorvastatin Other (See Comments)    Incredible dizziness    Family History:  Family History  Problem Relation Age of Onset  . Coronary artery disease Mother     died at 65  . Heart attack Sister     died at age 2  . Heart attack Brother       Prior to Admission medications   Medication Sig Start Date End Date Taking? Authorizing Provider  acyclovir (ZOVIRAX) 400 MG tablet TAKE ONE TABLET BY MOUTH TWICE DAILY Patient taking differently: Take 400 mg by mouth daily. For 6 months then  stop 06/20/14  Yes Aasim Marla Roe, MD  amLODipine (NORVASC) 5 MG tablet Take 1 tablet (5 mg total) by mouth daily. 01/12/14  Yes Minus Breeding, MD  aspirin 81 MG tablet Take 81 mg by mouth daily.    Yes Historical Provider, MD  atenolol (TENORMIN) 50 MG tablet Take 1 tablet (50 mg total) by mouth daily. 01/12/14  Yes Minus Breeding, MD  citalopram (CELEXA) 20 MG tablet TAKE ONE TABLET BY MOUTH ONCE DAILY Patient taking  differently: Take 20 mg by mouth daily.  06/20/14  Yes Aasim Marla Roe, MD  clopidogrel (PLAVIX) 75 MG tablet Take 1 tablet (75 mg total) by mouth daily. 01/12/14  Yes Minus Breeding, MD  furosemide (LASIX) 20 MG tablet Take one daily as needed for fluid Patient taking differently: Take 20 mg by mouth as needed for fluid.  04/15/14  Yes Minus Breeding, MD  HYDROcodone-acetaminophen (NORCO/VICODIN) 5-325 MG per tablet Take 1-2 tablets by mouth every 6 (six) hours as needed for moderate pain. 12/10/13  Yes Lucille Passy Babish, PA-C  isosorbide mononitrate (IMDUR) 120 MG 24 hr tablet Take 1 tablet (120 mg total) by mouth daily. 01/12/14  Yes Minus Breeding, MD  lactose free nutrition (BOOST PLUS) LIQD Take 237 mLs by mouth 2 (two) times daily as needed (Allow w/pt or pt's wife request after diet advancement). 12/13/13  Yes Annita Brod, MD  lidocaine-prilocaine (EMLA) cream Apply 1 application topically See admin instructions. Apply to port 1 hour before treatment. Cover site with plastic wrap 03/24/14  Yes Concha Norway, MD  methocarbamol (ROBAXIN) 500 MG tablet Take 1 tablet (500 mg total) by mouth every 6 (six) hours as needed for muscle spasms. 12/10/13  Yes Lucille Passy Babish, PA-C  nitroGLYCERIN (NITROSTAT) 0.4 MG SL tablet Place 1 tablet (0.4 mg total) under the tongue every 5 (five) minutes as needed for chest pain. 01/12/14  Yes Minus Breeding, MD  pravastatin (PRAVACHOL) 80 MG tablet Take 1 tablet (80 mg total) by mouth daily. 01/12/14  Yes Minus Breeding, MD  PRESCRIPTION MEDICATION Inject into the vein every 21 ( twenty-one) days. Velcade and Cytoxan.    Historical Provider, MD  sulfamethoxazole-trimethoprim (BACTRIM DS,SEPTRA DS) 800-160 MG per tablet Take 1 tablet by mouth every Monday, Wednesday, and Friday. Patient not taking: Reported on 08/02/2014 07/20/14   Aasim Marla Roe, MD    Physical Exam: Filed Vitals:   08/02/14 0437 08/02/14 0445 08/02/14 0500 08/02/14 0530  BP:  108/59 131/62 122/57 104/57  Pulse: 62 59 62 63  Temp:  97.4 F (36.3 C)    TempSrc:  Rectal    Resp: 18 22 18 16   Height:      Weight:      SpO2: 100% 87% 100% 100%     General:  Well-developed and nourished.  Eyes: Anicteric pallor present.  ENT: No discharge from the ears eyes nose and mouth.  Neck: No neck rigidity.  Cardiovascular: S1-S2 heard.  Respiratory: No rhonchi or crepitations.  Abdomen: Soft nontender bowel sounds present.  Skin: No rash.  Musculoskeletal: Bilateral lower extremity edema.  Psychiatric: Appears normal.  Neurologic: Alert awake oriented to time place and person. Moves all extremities.  Labs on Admission:  Basic Metabolic Panel:  Recent Labs Lab 08/01/14 1216 08/02/14 0431  NA 143 142  K 3.8 4.2  CL  --  106  CO2 28 24  GLUCOSE 156* 153*  BUN 24.5 33*  CREATININE 1.6* 1.42*  CALCIUM 9.0 8.3*   Liver Function Tests:  Recent Labs Lab 08/01/14 1216 08/02/14 0431  AST 18 14  ALT 12 8  ALKPHOS 84 60  BILITOT 0.58 0.3  PROT 6.0* 4.8*  ALBUMIN 3.6 2.6*   No results for input(s): LIPASE, AMYLASE in the last 168 hours. No results for input(s): AMMONIA in the last 168 hours. CBC:  Recent Labs Lab 08/01/14 1215 08/02/14 0431 08/02/14 0542  WBC 6.9 8.8 6.1  NEUTROABS 3.8 5.2  --   HGB 11.8* 8.4* 7.4*  HCT 36.3* 25.1* 23.0*  MCV 101.1* 101.6* 103.1*  PLT 196 166 PENDING   Cardiac Enzymes: No results for input(s): CKTOTAL, CKMB, CKMBINDEX, TROPONINI in the last 168 hours.  BNP (last 3 results)  Recent Labs  12/09/13 0130  PROBNP 630.1*   CBG:  Recent Labs Lab 07/27/14 1215  GLUCAP 83    Radiological Exams on Admission: Ct Head Wo Contrast  08/02/2014   CLINICAL DATA:  Headache to the top of the head. No injury. History of thymus cancer come of non-Hodgkin's lymphoma, mantle cell lymphoma, mass to stomach, colon, and neck. Headache.  EXAM: CT HEAD WITHOUT CONTRAST  TECHNIQUE: Contiguous axial images were  obtained from the base of the skull through the vertex without intravenous contrast.  COMPARISON:  None.  FINDINGS: Diffuse cerebral atrophy. Ventricular dilatation consistent with central atrophy. Low-attenuation changes in the deep white matter most likely representing small vessel ischemia. Low-attenuation changes demonstrate somewhat nodular appearance in the centrum semiovale and posterior periventricular regions. While these most likely represent a small vessel ischemic changes, given the history of metastatic disease, metastasis is not excluded. Consider further evaluation with contrast-enhanced MRI. Note mass effect or midline shift. No abnormal extra-axial fluid collections. Gray-white matter junctions are distinct. Basal cisterns are not effaced. No evidence of acute intracranial hemorrhage. Visualized paranasal sinuses and mastoid air cells are not opacified. Vascular calcifications. Calvarium appears intact.  IMPRESSION: Diffuse atrophy and small vessel ischemic changes. Early intracranial metastases are not excluded. No significant mass effect. No intracranial hemorrhage.   Electronically Signed   By: Lucienne Capers M.D.   On: 08/02/2014 06:08   Dg Chest Port 1 View  08/02/2014   CLINICAL DATA:  GI bleed, history of lymphoma and weakness.  EXAM: PORTABLE CHEST - 1 VIEW  COMPARISON:  PET-CT July 27, 2014 and chest radiograph December 09, 2013  FINDINGS: Patient is rotated to the RIGHT. Cardiomediastinal silhouette is nonsuspicious, mildly calcified aortic knob. Status post median sternotomy for apparent CABG. Bandlike density in LEFT lung base persists, no pleural effusions. No focal consolidation. No pneumothorax.  Single lumen RIGHT chest Port-A-Cath with distal tip projecting in mid superior vena cava, unchanged. Soft tissue planes and included osseous structures are nonsuspicious; scoliosis.  IMPRESSION: Stable LEFT lung base atelectasis versus scarring.   Electronically Signed   By: Elon Alas   On: 08/02/2014 05:10     Assessment/Plan Principal Problem:   Acute GI bleeding Active Problems:   Essential hypertension   Acute blood loss anemia   Chronic kidney disease (CKD), stage II (mild)   Bleeding gastrointestinal   1. Acute GI bleed - most likely upper GI. I have discussed with on-call gastroenterologist Dr. Henrene Pastor who will be seeing patient in consult. Patient has been kept nothing by mouth and we will transfuse 2 units of PRBC at this time as patient was hypotensive and his hemoglobin has fallen to 7.4 with a drop of 4 g from just 2 days ago. In addition patient also has CAD. Follow CBC  after transfusion. Patient is on Protonix infusion. Further recommendations per gastroenterologist. Aspirin Plavix on hold secondary to acute GI bleed. 2. Acute blood loss anemia - see #1. 3. Syncope probably secondary to vomiting and blood loss and hypotension. 4. CAD - denies any chest pain - aspirin and Plavix is on hold secondary #1. 5. Metastatic lymphoma - per oncologist. 6. Chronic kidney disease stage II - creatinine appears to be at baseline. 7. Chronic lower exudate edema - holding Lasix due to initial hypotension and active bleeding. 8. Hypertension - holding antihypertensives secondary to #1.    Code Status: Full code.  Family Communication: Patient's wife at the bedside.  Disposition Plan: Admit to inpatient to stepdown.    KAKRAKANDY,ARSHAD N. [facility] Pager [phone].  If 7PM-7AM, please contact night-coverage [email or web address] Password [email or web address] 08/02/2014, 6:12 AM

## 2014-08-02 NOTE — Procedures (Signed)
Intubation Procedure Note Martin Morris [id number] 11/15/1932  Procedure: Intubation Indications: Airway protection and maintenance  Procedure Details Consent: Risks of procedure as well as the alternatives and risks of each were explained to the (patient/caregiver).  Consent for procedure obtained. Time Out: Verified patient identification, verified procedure, site/side was marked, verified correct patient position, special equipment/implants available, medications/allergies/relevent history reviewed, required imaging and test results available.  Performed  Maximum sterile technique was used including antiseptics, cap, gloves, gown, hand hygiene and mask.  MAC and 4    Evaluation Hemodynamic Status: BP stable throughout; O2 sats: stable throughout Patient's Current Condition: stable Complications: No apparent complications Patient did tolerate procedure well. Chest X-ray ordered to verify placement.  CXR: pending.   BABCOCK,PETE 08/02/2014  Supervised by me   Kara Mead V.

## 2014-08-02 NOTE — Telephone Encounter (Signed)
Message sent to Dr.Hochrein. 

## 2014-08-02 NOTE — Progress Notes (Signed)
CARE MANAGEMENT NOTE 08/02/2014  Patient:  Martin Morris, Martin Morris   Account Number:  [id number]  Date Initiated:  08/02/2014  Documentation initiated by:  DAVIS,RHONDA  Subjective/Objective Assessment:   pt with active upper gi bleed in the presence of metstatic gi cancers, pt was intubated prior to [facility] for airway protection.     Action/Plan:   tbd based on progression   Anticipated DC Date:  08/05/2014   Anticipated DC Plan:  HOME/SELF CARE  In-house referral  NA      DC Planning Services  CM consult      PAC Choice  NA   Choice offered to / List presented to:  NA   DME arranged  NA      DME agency  NA     [place] arranged  NA      [place] agency  NA   Status of service:  In process, will continue to follow Medicare Important Message given?   (If response is "NO", the following Medicare IM given date fields will be blank) Date Medicare IM given:   Medicare IM given by:   Date Additional Medicare IM given:   Additional Medicare IM given by:    Discharge Disposition:    Per UR Regulation:  Reviewed for med. necessity/level of care/duration of stay  If discussed at [place] of Stay Meetings, dates discussed:    Comments:  [id number]/Rhonda Davis,RN,BSN,CCM:

## 2014-08-03 ENCOUNTER — Encounter (HOSPITAL_COMMUNITY): Payer: Self-pay | Admitting: Internal Medicine

## 2014-08-03 ENCOUNTER — Ambulatory Visit
Admit: 2014-08-03 | Discharge: 2014-08-03 | Disposition: A | Discharge status: Home / Self Care | Payer: Medicare Other | Attending: Radiation Oncology | Admitting: Radiation Oncology

## 2014-08-03 ENCOUNTER — Other Ambulatory Visit: Payer: Self-pay

## 2014-08-03 DIAGNOSIS — C8313 Mantle cell lymphoma, intra-abdominal lymph nodes: Secondary | ICD-10-CM

## 2014-08-03 DIAGNOSIS — J9601 Acute respiratory failure with hypoxia: Secondary | ICD-10-CM

## 2014-08-03 DIAGNOSIS — D6489 Other specified anemias: Secondary | ICD-10-CM

## 2014-08-03 DIAGNOSIS — K250 Acute gastric ulcer with hemorrhage: Secondary | ICD-10-CM

## 2014-08-03 DIAGNOSIS — R51 Headache: Secondary | ICD-10-CM

## 2014-08-03 DIAGNOSIS — D631 Anemia in chronic kidney disease: Secondary | ICD-10-CM

## 2014-08-03 DIAGNOSIS — D62 Acute posthemorrhagic anemia: Secondary | ICD-10-CM

## 2014-08-03 DIAGNOSIS — N179 Acute kidney failure, unspecified: Secondary | ICD-10-CM

## 2014-08-03 LAB — CBC
HCT: 29.9 % — ABNORMAL LOW (ref 39.0–52.0)
HCT: 29.1 % — ABNORMAL LOW (ref 39.0–52.0)
HEMATOCRIT: 29.7 % — AB (ref 39.0–52.0)
Hemoglobin: 10 g/dL — ABNORMAL LOW (ref 13.0–17.0)
Hemoglobin: 9.8 g/dL — ABNORMAL LOW (ref 13.0–17.0)
Hemoglobin: 9.7 g/dL — ABNORMAL LOW (ref 13.0–17.0)
MCH: 29.9 pg (ref 26.0–34.0)
MCH: 30.2 pg (ref 26.0–34.0)
MCH: 29.8 pg (ref 26.0–34.0)
MCHC: 32.7 g/dL (ref 30.0–36.0)
MCHC: 33.4 g/dL (ref 30.0–36.0)
MCHC: 33.7 g/dL (ref 30.0–36.0)
MCV: 89.5 fL (ref 78.0–100.0)
MCV: 91.4 fL (ref 78.0–100.0)
MCV: 89.8 fL (ref 78.0–100.0)
PLATELETS: 137 10*3/uL — AB (ref 150–400)
Platelets: 146 10*3/uL — ABNORMAL LOW (ref 150–400)
Platelets: 143 10*3/uL — ABNORMAL LOW (ref 150–400)
RBC: 3.34 MIL/uL — ABNORMAL LOW (ref 4.22–5.81)
RBC: 3.25 MIL/uL — ABNORMAL LOW (ref 4.22–5.81)
RBC: 3.24 MIL/uL — AB (ref 4.22–5.81)
RDW: 18.9 % — AB (ref 11.5–15.5)
RDW: 19.2 % — ABNORMAL HIGH (ref 11.5–15.5)
RDW: 19.3 % — AB (ref 11.5–15.5)
WBC: 8.2 10*3/uL (ref 4.0–10.5)
WBC: 7.7 10*3/uL (ref 4.0–10.5)
WBC: 7.7 10*3/uL (ref 4.0–10.5)

## 2014-08-03 LAB — GLUCOSE, CAPILLARY
GLUCOSE-CAPILLARY: 102 mg/dL — AB (ref 70–99)
GLUCOSE-CAPILLARY: 89 mg/dL (ref 70–99)
Glucose-Capillary: 120 mg/dL — ABNORMAL HIGH (ref 70–99)
Glucose-Capillary: 108 mg/dL — ABNORMAL HIGH (ref 70–99)
Glucose-Capillary: 99 mg/dL (ref 70–99)

## 2014-08-03 LAB — PREPARE PLATELET PHERESIS: Unit division: 0

## 2014-08-03 LAB — BASIC METABOLIC PANEL
Anion gap: 10 (ref 5–15)
BUN: 28 mg/dL — AB (ref 6–23)
CHLORIDE: 111 meq/L (ref 96–112)
CO2: 24 mEq/L (ref 19–32)
Calcium: 7.9 mg/dL — ABNORMAL LOW (ref 8.4–10.5)
Creatinine, Ser: 1.08 mg/dL (ref 0.50–1.35)
GFR, EST AFRICAN AMERICAN: 73 mL/min — AB (ref >90)
GFR, EST NON AFRICAN AMERICAN: 63 mL/min — AB (ref >90)
Glucose, Bld: 120 mg/dL — ABNORMAL HIGH (ref 70–99)
POTASSIUM: 3.7 meq/L (ref 3.7–5.3)
SODIUM: 145 meq/L (ref 137–147)

## 2014-08-03 LAB — PROTIME-INR
INR: 1.23 (ref 0.00–1.49)
PROTHROMBIN TIME: 15.7 s — AB (ref 11.6–15.2)

## 2014-08-03 NOTE — Progress Notes (Signed)
PULMONARY / CRITICAL CARE MEDICINE   Name: Martin Morris MRN: [id number] DOB: October 21, 1932    ADMISSION DATE:  08/02/2014 CONSULTATION DATE:  12/9  REFERRING MD :  Charlies Silvers   CHIEF COMPLAINT:  Acute UGIB   INITIAL PRESENTATION:  80 year old male undergoing active chemo-therapy for Mantle cell lymphoma. Most recent PET scan demonstrated diffuse progression of disease (see study section below), most notably tumor in fundus of stomach. Admitted to [facility] on 12/8 w/ acute UGIB, treated w/ PPI gtt and blood x-fusion. PCCM asked to consult later that am due to bradycardia and syncopal event following episode of hematemesis.    STUDIES:  PET scan 12/2: most recent PET scan shows extensive hypermetabolic foci.. Including: area which likely represents tumor in the fundus of the stomach, descending colon and cecum . Also has hypermetabolism throughout the neck, chest and abdomen, as detailed above. Sites are highlyvaried and include posterior nasopharyngeal mucosa (left greater than right), pleura, left hilar lymph node, gastric mucosa, gastrohepatic ligament lymph node and colonic lesions CT head 12/8: Diffuse atrophy and small vessel ischemic changes. Early intracranial metastases are not excluded. No significant mass effect. No intracranial hemorrhage.  SIGNIFICANT EVENTS: 12/8 EGD-malignant gastric ulcer - clipping  SUBJECTIVE:  Not in acute distress.  Afebrile No hematemesis or malena  VITAL SIGNS: Temp:  [96.3 F (35.7 C)-100 F (37.8 C)] 98.7 F (37.1 C) (12/09 0740) Pulse Rate:  [57-72] 67 (12/09 0751) Resp:  [8-28] 18 (12/09 0751) BP: (93-147)/(47-75) 104/49 mmHg (12/09 0751) SpO2:  [97 %-100 %] 100 % (12/09 0751) FiO2 (%):  [35 %-100 %] 35 % (12/09 0751) Weight:  [98.3 kg (216 lb 11.4 oz)] 98.3 kg (216 lb 11.4 oz) (12/09 0400) HEMODYNAMICS:   VENTILATOR SETTINGS: Vent Mode:  [-] CPAP;PSV FiO2 (%):  [35 %-100 %] 35 % Set Rate:  [14 bmp] 14 bmp Vt Set:  [600 mL] 600 mL PEEP:  [5  cmH20] 5 cmH20 Pressure Support:  [5 cmH20] 5 cmH20 Plateau Pressure:  [11 cmH20-24 cmH20] 16 cmH20 INTAKE / OUTPUT:  Intake/Output Summary (Last 24 hours) at 08/03/14 3474 Last data filed at 08/03/14 0700  Gross per 24 hour  Intake   3277 ml  Output   1685 ml  Net   1592 ml    PHYSICAL EXAMINATION: General:  80 year old male, slightly pale  Neuro:  Awake, oriented  HEENT:  Awake, alert, no focal def  Cardiovascular:  rrr Lungs:  Clear  Abdomen:  Soft, non-tender + bowel sound  Musculoskeletal:  weak Skin:  Bilateral LE edema w/ chronic venous stasis changes   LABS:  CBC  Recent Labs Lab 08/02/14 1030 08/02/14 1600 08/03/14 0028  WBC 6.6 7.8 8.2  HGB 8.3* 9.2* 10.0*  HCT 25.1* 27.5* 29.9*  PLT 117* 162 146*   Coag's No results for input(s): APTT, INR in the last 168 hours. BMET  Recent Labs Lab 08/01/14 1216 08/02/14 0431 08/03/14 0400  NA 143 142 145  K 3.8 4.2 3.7  CL  --  106 111  CO2 28 24 24   BUN 24.5 33* 28*  CREATININE 1.6* 1.42* 1.08  GLUCOSE 156* 153* 120*   Electrolytes  Recent Labs Lab 08/01/14 1216 08/02/14 0431 08/03/14 0400  CALCIUM 9.0 8.3* 7.9*   Sepsis Markers No results for input(s): LATICACIDVEN, PROCALCITON, O2SATVEN in the last 168 hours. ABG  Recent Labs Lab 08/02/14 1055  PHART 7.365  PCO2ART 41.3  PO2ART 325.0*   Liver Enzymes  Recent Labs Lab  08/01/14 1216 08/02/14 0431  AST 18 14  ALT 12 8  ALKPHOS 84 60  BILITOT 0.58 0.3  ALBUMIN 3.6 2.6*   Cardiac Enzymes  Recent Labs Lab 08/02/14 0542  TROPONINI <0.30   Glucose  Recent Labs Lab 07/27/14 1215 08/02/14 1242 08/02/14 1823 08/03/14 0004 08/03/14 0613 08/03/14 0738  GLUCAP 83 148* 92 89 120* 108*    Imaging Ct Head Wo Contrast  08/02/2014   CLINICAL DATA:  Headache to the top of the head. No injury. History of thymus cancer come of non-Hodgkin's lymphoma, mantle cell lymphoma, mass to stomach, colon, and neck. Headache.  EXAM: CT HEAD  WITHOUT CONTRAST  TECHNIQUE: Contiguous axial images were obtained from the base of the skull through the vertex without intravenous contrast.  COMPARISON:  None.  FINDINGS: Diffuse cerebral atrophy. Ventricular dilatation consistent with central atrophy. Low-attenuation changes in the deep white matter most likely representing small vessel ischemia. Low-attenuation changes demonstrate somewhat nodular appearance in the centrum semiovale and posterior periventricular regions. While these most likely represent a small vessel ischemic changes, given the history of metastatic disease, metastasis is not excluded. Consider further evaluation with contrast-enhanced MRI. Note mass effect or midline shift. No abnormal extra-axial fluid collections. Gray-white matter junctions are distinct. Basal cisterns are not effaced. No evidence of acute intracranial hemorrhage. Visualized paranasal sinuses and mastoid air cells are not opacified. Vascular calcifications. Calvarium appears intact.  IMPRESSION: Diffuse atrophy and small vessel ischemic changes. Early intracranial metastases are not excluded. No significant mass effect. No intracranial hemorrhage.   Electronically Signed   By: Lucienne Capers M.D.   On: 08/02/2014 06:08   Portable Chest Xray  08/02/2014   CLINICAL DATA:  Respiratory distress. Acute on chronic respiratory failure.  EXAM: PORTABLE CHEST - 1 VIEW 10:20 a.m.  COMPARISON:  08/02/2014 at 4:48 a.m.  FINDINGS: Endotracheal tube has been inserted and appears in good position approximately 3 cm above the carina. Power port in place with the tip in the superior vena cava. NG tube tip is below the diaphragm.  Heart size and pulmonary vascularity are normal. There is slight increased but minimal atelectasis at the left lung base. There is a vague nodular appearing density laterally in the left midzone which is new since 12/09/2013.  IMPRESSION: Endotracheal tube and NG tube appear in good position. Slight  increased but minimal atelectasis at the left lung base. Possible pulmonary nodule in the left midzone laterally.   Electronically Signed   By: Rozetta Nunnery M.D.   On: 08/02/2014 10:49   Dg Chest Port 1 View  08/02/2014   CLINICAL DATA:  GI bleed, history of lymphoma and weakness.  EXAM: PORTABLE CHEST - 1 VIEW  COMPARISON:  PET-CT July 27, 2014 and chest radiograph December 09, 2013  FINDINGS: Patient is rotated to the RIGHT. Cardiomediastinal silhouette is nonsuspicious, mildly calcified aortic knob. Status post median sternotomy for apparent CABG. Bandlike density in LEFT lung base persists, no pleural effusions. No focal consolidation. No pneumothorax.  Single lumen RIGHT chest Port-A-Cath with distal tip projecting in mid superior vena cava, unchanged. Soft tissue planes and included osseous structures are nonsuspicious; scoliosis.  IMPRESSION: Stable LEFT lung base atelectasis versus scarring.   Electronically Signed   By: Elon Alas   On: 08/02/2014 05:10     ASSESSMENT / PLAN:  PULMONARY OETT A:  H/o OSA Acute resp failure , intubated for EGD P:   Supportive care extubate  CARDIOVASCULAR CVL: right port  A:  Symptomatic  bradycardia in setting of syncopal event Hemorrhagic shock  P:  Volume resuscitated Hold all antihypertensives Hold ASA & plavix  RENAL A:   CKD stage II w/ acute on chronic AKI (baseline scr 1.4 range). Improved  P:   Avoid hypotension Hold antihypertensives Strict I&O  GASTROINTESTINAL A:   Acute UGIB. malignant gastric ulcer Epistaxis  P:   GI following PPI gtt If rebleed, will need IR embolisation   HEMATOLOGIC A:   Mantle cell lymphoma of intra-abd LNs stage IV w/ disease progression (see PET scan report above) Chronic anemia; Macrocytic  Acute blood loss anemia  Mild thrombocytopenia P:  Transfuse for hgb goal of at least > 8 in setting of active bleeding Transfused  platelets     INFECTIOUS A: no acute  P:   Trend  fever curve  ENDOCRINE A:  Mild hyperglycemia  P:   ssi protocol   NEUROLOGIC A:   Transient acute encephalopathy (due to syncopal event w/ GIB)-->resolved.  Early brain mets  P:   RASS goal: 0 Supportive care  Hold all sedating meds    FAMILY  - Updates: family updated at length on plan of care 12/8  - Inter-disciplinary family meet or Palliative Care meeting due by:  12/13    The patient is critically ill with multiple organ systems failure and requires high complexity decision making for assessment and support, frequent evaluation and titration of therapies, application of advanced monitoring technologies and extensive interpretation of multiple databases. Critical Care Time devoted to patient care services described in this note independent of APP time is 35 minutes.    Rigoberto Noel MD   08/03/2014, 8:33 AM

## 2014-08-03 NOTE — Consult Note (Signed)
Martin Morris   DOB:18-Jul-1933   GL#:[id number]   JJO#:[id number]  Patient Care Team: Dione Housekeeper, MD as PCP - General (Family Medicine) Heath Lark, MD as Consulting Physician (Hematology and Oncology) I have seen the patient, examined him and edited the notes as follows  Subjective: Martin Morris is a 80 year old man with a history of Mantle Cell Lymphoma, admitted on 12/8 with acute GI bleed, likely due to malignancy seen per PET scan on 12/2. On presentation, the patient developed bradycardia and a syncopal episode following hematemesis, acute respiratory failure requiring intubation and acute on chronic renal failure. The patient is now extubated since this morning. He denies any further hematemesis. He denies hematochezia, gum or nose bleeding. His last bowel movement was on 12/8 without melena. He denies any worsening shortness of breath or chest pain. He denies any pain. He reports feeling exhausted.No confusion is reported by Nursing.  On 12/8 GI evaluated the patient and proceeded with EGD. A malignant gastric ulcer was seen and clipped with resolution of bleeding. A CT of the head on 12/8 showed possible early intracranial metastases. No intracranial hemorrhage or significant mass effect was noted. He is being managed by [facility], receiving pressors, IV fluids and transfusion of blood with good response. His Aspirin and plavix were placed on hold.   Summary of his Oncological History is as follows: He was diagnosed with Mantle cell lymphoma diagnosed in March 2008 with positive bone marrow initially on 12/03/2006 and then negative bone marrow after treatment in October 2009.  The patient presented with obstructive liver abnormalities requiring ERCP and non metal stent placement by Dr. Erskine Emery. The stent was subsequently removed in October 2008.  The patient was treated with 8 cycles of Rituxan, Cytoxan, vincristine and Decadron in combination with Neulasta from  12/19/2006 through 05/15/2007.  The patient then received maintenance Rituxan from July 16, 2007 through February 01, 2010.  While on Rituxan, he had a recurrence of his disease by CT scan carried out on 01/24/2010.  Martin Morris then received treatment with bendamustine and Rituxan in combination with Neulasta for 6 cycles from 02/19/2010 through 07/26/2010.  PET scan from 08/22/2010 and CT scans of chest, abdomen, and pelvis from 01/01/2011 that showed no evidence of disease.  He had been off treatment since December 2011 and had been doing well without any symptoms until the CT scan of the abdomen and pelvis on 07/01/2011 showed signs of recurrence.  CT scans of abdomen and pelvis with IVC on 09/02/11 showed further progression.  Rituxan, subcutaneous Velcade, intravenous Cytoxan and Decadron were initiated on 10/15/2011.  CT scans of the abdomen and pelvis carried out on 01/08/2012 showed a near complete response to therapy.  CT scan of the abdomen and pelvis with IV contrast carried out on 06/19/2012 showed stable plaque-like soft tissue density in the upper abdominal retroperitoneum compared with the CT scan of 01/08/2012.  CT scan of the abdomen and pelvis with IV contrast carried out on 11/30/2012 showed no evidence of recurrent lymphoma.   Previously Velcade, Cytoxan, and Decadron were being administered every 2 weeks, and Rituxan was being administered every 4 weeks. (from 12/04/2012 - 08/05/2013). Prior to that beginning on 10/15/2011 - 12/04/2012, Velcade, Cytoxan, and Decadron were being administered weekly, and Rituxan was being administered every 2 weeks. In August of 2014, he was scheduled for chemotherapy but had chest pain and it was held.  He underwent a cardiac cath which revealed two of three native vessel  ischemic disease on 04/13/2013 but he was a poor surgical candidate for a re-do CABG. His therapy was re-started in October 2014. As of 08/05/2013, treatment program was as  follows:  -Velcade 2.75 mg subcu.  -Cytoxan 400 mg IV.  -Decadron 20 mg IV.  The above drugs will be given every 3 weeks.  -Rituxan 800 mg IV every 6 weeks.  In April he had an accidental fall resulting in a left hip fracture. He underwent L THR on 12/09/2013 by Dr. Paralee Cancel. He was discharged to rehab. Prior to his fall, he had restaging scans including CT chest, abdomen and pelvis on 11/913 which demonstrated absence of recurrent lymphoma in the chest, abdomen or pelvis.  PET scan performed on 12/2 had shown diffuse progression of the disease, most obvious in fundus of stomach, as well as descending colon and cecum. Also seen, hypermetabolism in the neck, chest, and abdomen. A CT of the head on 12/8 during admission showed possible early intracranial metastases. No intracranial hemorrhage or significant mass effect was noted. On 12/8 GI evaluated the patient and proceeded with EGD. A malignant gastric ulcer was seen and clipped with resolution of bleeding. He complained of recent headaches. He denies focal neurological deficits.  Scheduled Meds: . sodium chloride   Intravenous Once  . sodium chloride   Intravenous Once  . antiseptic oral rinse  7 mL Mouth Rinse QID  . chlorhexidine  15 mL Mouth Rinse BID  . [START ON 08/05/2014] pantoprazole (PROTONIX) IV  40 mg Intravenous Q12H   Continuous Infusions: . sodium chloride 75 mL/hr at 08/02/14 0942  . pantoprozole (PROTONIX) infusion 8 mg/hr (08/02/14 0646)   PRN Meds:acetaminophen **OR** acetaminophen, fentaNYL, fentaNYL, nitroGLYCERIN, ondansetron **OR** ondansetron (ZOFRAN) IV   Objective:  Filed Vitals:   08/03/14 0751  BP: 104/49  Pulse: 67  Temp:   Resp: 18      Intake/Output Summary (Last 24 hours) at 08/03/14 0830 Last data filed at 08/03/14 0700  Gross per 24 hour  Intake   3277 ml  Output   1685 ml  Net   1592 ml    ECOG PERFORMANCE STATUS:  GENERAL:alert, no distress and ill appearing SKIN: skin  color, texture, turgor are normal, no rashes or significant lesions. Chronic venous stasis changes EYES: normal, conjunctiva are pink and non-injected, sclera clear OROPHARYNX:no exudate, no erythema and lips, buccal mucosa, and tongue normal  NECK: supple, thyroid normal size, non-tender, without nodularity LYMPH:  no palpable lymphadenopathy in the cervical, axillary or inguinal LUNGS:trace of rhonchi anteriorly without wheezes or rales, with normal breathing effort HEART: regular rate & rhythm and no murmurs and bilateral lower extremity edema with venous stasis changes ABDOMEN:abdomen soft, non-tender and normal bowel sounds Musculoskeletal:no cyanosis of digits and no clubbing  PSYCH: alert & oriented x 3 with fluent speech NEURO: no focal motor/sensory deficits    CBG (last 3)   Recent Labs  08/03/14 0004 08/03/14 0613 08/03/14 0738  GLUCAP 89 120* 108*     Labs:   Recent Labs Lab 08/01/14 1215 08/02/14 0431 08/02/14 0542 08/02/14 1030 08/02/14 1600 08/03/14 0028  WBC 6.9 8.8 6.1 6.6 7.8 8.2  HGB 11.8* 8.4* 7.4* 8.3* 9.2* 10.0*  HCT 36.3* 25.1* 23.0* 25.1* 27.5* 29.9*  PLT 196 166 124* 117* 162 146*  MCV 101.1* 101.6* 103.1* 94.0 92.3 89.5  MCH 32.9 34.0 33.2 31.1 30.9 29.9  MCHC 32.5 33.5 32.2 33.1 33.5 33.4  RDW 13.5 13.6 13.7 17.9* 18.2* 18.9*  LYMPHSABS 2.1  2.5  --   --   --   --   MONOABS 0.7 0.9  --   --   --   --   EOSABS 0.3 0.3  --   --   --   --   BASOSABS 0.0 0.0  --   --   --   --      Chemistries:    Recent Labs Lab 08/01/14 1216 08/02/14 0431 08/03/14 0400  NA 143 142 145  K 3.8 4.2 3.7  CL  --  106 111  CO2 28 24 24   GLUCOSE 156* 153* 120*  BUN 24.5 33* 28*  CREATININE 1.6* 1.42* 1.08  CALCIUM 9.0 8.3* 7.9*  AST 18 14  --   ALT 12 8  --   ALKPHOS 84 60  --   BILITOT 0.58 0.3  --     GFR Estimated Creatinine Clearance: 63.1 mL/min (by C-G formula based on Cr of 1.08).  Liver Function Tests:  Recent Labs Lab  08/01/14 1216 08/02/14 0431  AST 18 14  ALT 12 8  ALKPHOS 84 60  BILITOT 0.58 0.3  PROT 6.0* 4.8*  ALBUMIN 3.6 2.6*   Urine Studies     Component Value Date/Time   COLORURINE YELLOW 12/09/2013 0109   APPEARANCEUR CLEAR 12/09/2013 0109   LABSPEC 1.010 12/09/2013 0109   PHURINE 7.0 12/09/2013 0109   GLUCOSEU NEGATIVE 12/09/2013 0109   HGBUR NEGATIVE 12/09/2013 0109   BILIRUBINUR NEGATIVE 12/09/2013 0109   KETONESUR NEGATIVE 12/09/2013 0109   PROTEINUR NEGATIVE 12/09/2013 0109   UROBILINOGEN 0.2 12/09/2013 0109   NITRITE NEGATIVE 12/09/2013 0109   LEUKOCYTESUR NEGATIVE 12/09/2013 0109     Cardiac Enzymes:  Recent Labs Lab 08/02/14 0542  TROPONINI <0.30   CBG:  Recent Labs Lab 08/02/14 1242 08/02/14 1823 08/03/14 0004 08/03/14 0613 08/03/14 0738  GLUCAP 148* 92 89 120* 108*   Microbiology Cultures negative    Imaging Studies: I reviewed the imaging Ct Head Wo Contrast  08/02/2014   CLINICAL DATA:  Headache to the top of the head. No injury. History of thymus cancer come of non-Hodgkin's lymphoma, mantle cell lymphoma, mass to stomach, colon, and neck. Headache.  EXAM: CT HEAD WITHOUT CONTRAST  TECHNIQUE: Contiguous axial images were obtained from the base of the skull through the vertex without intravenous contrast.  COMPARISON:  None.  FINDINGS: Diffuse cerebral atrophy. Ventricular dilatation consistent with central atrophy. Low-attenuation changes in the deep white matter most likely representing small vessel ischemia. Low-attenuation changes demonstrate somewhat nodular appearance in the centrum semiovale and posterior periventricular regions. While these most likely represent a small vessel ischemic changes, given the history of metastatic disease, metastasis is not excluded. Consider further evaluation with contrast-enhanced MRI. Note mass effect or midline shift. No abnormal extra-axial fluid collections. Gray-white matter junctions are distinct. Basal cisterns  are not effaced. No evidence of acute intracranial hemorrhage. Visualized paranasal sinuses and mastoid air cells are not opacified. Vascular calcifications. Calvarium appears intact.  IMPRESSION: Diffuse atrophy and small vessel ischemic changes. Early intracranial metastases are not excluded. No significant mass effect. No intracranial hemorrhage.   Electronically Signed   By: Lucienne Capers M.D.   On: 08/02/2014 06:08   Portable Chest Xray  08/02/2014   CLINICAL DATA:  Respiratory distress. Acute on chronic respiratory failure.  EXAM: PORTABLE CHEST - 1 VIEW 10:20 a.m.  COMPARISON:  08/02/2014 at 4:48 a.m.  FINDINGS: Endotracheal tube has been inserted and appears in good position approximately 3 cm  above the carina. Power port in place with the tip in the superior vena cava. NG tube tip is below the diaphragm.  Heart size and pulmonary vascularity are normal. There is slight increased but minimal atelectasis at the left lung base. There is a vague nodular appearing density laterally in the left midzone which is new since 12/09/2013.  IMPRESSION: Endotracheal tube and NG tube appear in good position. Slight increased but minimal atelectasis at the left lung base. Possible pulmonary nodule in the left midzone laterally.   Electronically Signed   By: Rozetta Nunnery M.D.   On: 08/02/2014 10:49   Dg Chest Port 1 View  08/02/2014   CLINICAL DATA:  GI bleed, history of lymphoma and weakness.  EXAM: PORTABLE CHEST - 1 VIEW  COMPARISON:  PET-CT July 27, 2014 and chest radiograph December 09, 2013  FINDINGS: Patient is rotated to the RIGHT. Cardiomediastinal silhouette is nonsuspicious, mildly calcified aortic knob. Status post median sternotomy for apparent CABG. Bandlike density in LEFT lung base persists, no pleural effusions. No focal consolidation. No pneumothorax.  Single lumen RIGHT chest Port-A-Cath with distal tip projecting in mid superior vena cava, unchanged. Soft tissue planes and included osseous  structures are nonsuspicious; scoliosis.  IMPRESSION: Stable LEFT lung base atelectasis versus scarring.   Electronically Signed   By: Elon Alas   On: 08/02/2014 05:10   Nm Pet Image Restag (ps) Skull Base To Thigh  12/2/2015COMPARISON: PET-CT 08/22/2010. FINDINGS: NECK There is asymmetric soft tissue thickening in the posterior aspect of the nasopharynx bilaterally (left greater than right), which is hypermetabolic (SUVmax = 7.7 on the left and 5.2 on the right). There is also a hypermetabolism in the posterior spinal musculature at the skullbase, anterior spinal musculature on the left, and strap muscles on the left, presumably physiologic. No hypermetabolic lymph nodes in the neck. CHEST Compared to prior examinations there are new multifocal areas of nodular pleural thickening which are hypermetabolic (SUVmax = 1.6)XW the thorax bilaterally (left greater than right). In addition, there is an irregular-shaped ovoid lesion in the right upper lobe (image 25 of series 6) measuring approximately 2.5 x 1.4 cm with several tiny satellite nodules which demonstrates some low-level hypermetabolic activity (SUVmax = 4.5). 1.3 cm short axis left hilar lymph node is hypermetabolic (SUVmax = 5.0). No other mediastinal or right hilar lymphadenopathy is noted. Small hiatal hernia with some hypermetabolism in the herniated portion of the cardia of the stomach (SUVmax = 4.6). The remainder the esophagus is otherwise unremarkable in appearance. Heart size is normal. There is no significant pericardial fluid, thickening or pericardial calcification. There is atherosclerosis of the thoracic aorta, the great vessels of the mediastinum and the coronary arteries, including calcified atherosclerotic plaque in the left main, left anterior descending, left circumflex and right coronary arteries. Status post median sternotomy for CABG, including LIMA to the LAD. Calcifications of the aortic valve. Right-sided internal  jugular single-lumen porta cath with tip terminating in the distal superior vena cava. ABDOMEN/PELVIS Multiple other foci of hypermetabolism are noted within the gastrointestinal tract, including some amorphous hypermetabolism along the greater curvature of the fundus of the stomach, which corresponds to some mild focal gastric wall thickening (SUVmax = 6.6). Mildly enlarged 1 cm short axis gastrohepatic ligament lymph node demonstrates low-level hypermetabolism (SUVmax = 3.5). In addition, there is some focal thickening in the posterior colonic wall in the proximal descending colon, best appreciated on image 103 of series 4 where there is hypermetabolism (SUVmax = 5.7). There is  also some mass like thickening of the cecum adjacent to the ileocecal valve (image 134 of series 4) measuring approximately 3.4 x 2.9 cm demonstrating hypermetabolism (SUVmax = 8.0). No abnormal hypermetabolic activity within the liver, pancreas, adrenal glands, or spleen. 3 mm calcification in the interpolar region of the left kidney is favored to be vascular, but could represent a nonobstructive calculus. Extensive atherosclerosis throughout the abdominal and pelvic vasculature, without evidence of aneurysm. Numerous colonic diverticulae are noted, particularly in the distal descending colon, without surrounding inflammatory changes to suggest an acute diverticulitis at this time. Moderate left inguinal hernia containing only fat incidentally noted. SKELETON No focal hypermetabolic activity to suggest skeletal metastasis. Status post left hip arthroplasty. Median sternotomy wires. IMPRESSION: 1. Today's study demonstrates multifocal hypermetabolism throughout the neck, chest and abdomen, as detailed above. Sites are highly varied and include posterior nasopharyngeal mucosa (left greater than right), pleura, left hilar lymph node, gastric mucosa, gastrohepatic ligament lymph node and colonic lesions, as above. Recurrent lymphoma could  certainly effect any or all of these regions, however, the appearance is somewhat unusual, and in a very different distribution than the patient's original disease which was predominantly extensive retroperitoneal and upper abdominal lymphadenopathy in 2008. Correlation with colonoscopy is suggested to evaluate for potential colonic primary. As well, upper endoscopy is suggested to evaluate the activity within the proximal stomach. 2. In addition to the multifocal pleural lesions, there is an irregular-shaped nodule in the periphery of the right upper lobe which demonstrates some hypermetabolism. Based on the appearance of this lesion, this is favored to be of infectious or inflammatory etiology, however, a site of parenchymal infiltration with lymphoma or primary bronchogenic neoplasm is not excluded and close attention on followup studies is recommended. 3. Atherosclerosis, including left main and 3 vessel coronary artery disease. Status post median sternotomy for CABG, including LIMA to the LAD. 4. Colonic diverticulosis without findings to suggest acute diverticulitis at this time. 5. Additional incidental findings, as above. Electronically Signed By: Vinnie Langton M.D. On: 07/27/2014 15:08   Assessment/Plan: 80 y.o.   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 He receives Rituxan every alternate cycle. Last chemotherapy was given on 07/11/14, cycle #81. The pattern of recurrence is highly unusual. PET scan on 12/2 shows multifocal hypermetabolism throughout the neck, chest and abdomen. CT of the head shows possible early metastatic disease. His case will be reviewed at the next hematology tumor board for treatment recommendations. The patient may need repeat biopsy. If repeat biopsy confirmed recurrence of mantle cell lymphoma, the current guidelines suggested treatment with Ibrutinib. His scheduled chemo  for 12/7 was cancelled until further notice  Anemia In the setting of acute GI bleed, chronic disease and chronic kidney disease Hb on admission was 7.4 requiring 2 units of blood with good response. He underwent Upper Endoscopy, with visualization of a malignant ulcer was seen and clipped with resolution of bleeding at this time.  Possible angiogram with embolization and coiling by IR if a candidate for the procedure Continue to transfuse to maintain a Hb above 7, or to 8 if patient remains symptomatic. Appreciate GI and PCCM involvement. I will consult Dr. Tammi Klippel for possible radiation therapy to help with the malignant bleeding ulcer.  Chronic kidney disease (CKD), stage II (mild) This is chronic in nature, unlikely related to his cancer. Recommend close observation. Baseline is 1.4 His acute renal failure is now resolved  Headaches with abnormal head  imagingf I am not too impressive with the findings on his CT scan. Once the patient is most stable, MRI of the head would be indicated to exclude malignancy.  Full Code  Other medical issues including Symptomatic bradycardia in setting of syncopal event, Obstructive sleep apnea, hyperglycemia and transient acute encephalopathy   as per admitting team     **Disclaimer: This note was dictated with voice recognition software. Similar sounding words can inadvertently be transcribed and this note may contain transcription errors which may not have been corrected upon publication of note.Sharene Butters E, PA-C 08/03/2014  8:30 AM Janiel Derhammer, MD 08/03/2014

## 2014-08-03 NOTE — Plan of Care (Signed)
Problem: Phase I Progression Outcomes Goal: HOB elevated 30 degrees Outcome: Completed/Met Date Met:  08/03/14

## 2014-08-03 NOTE — Plan of Care (Signed)
Problem: Phase I Progression Outcomes Goal: VTE prophylaxis Outcome: Completed/Met Date Met:  08/03/14 Patient is on SCD's

## 2014-08-03 NOTE — Plan of Care (Signed)
Problem: Phase I Progression Outcomes Goal: VAP prevention protocol initiated Outcome: Completed/Met Date Met:  08/03/14

## 2014-08-03 NOTE — Procedures (Signed)
Extubation Procedure Note  Patient Details:   Name: Martin Morris DOB: 16-Mar-1933 MRN: [id number]   Airway Documentation:     Evaluation  O2 sats: stable throughout Complications: No apparent complications Patient did tolerate procedure well. Bilateral Breath Sounds: Diminished, Clear   Yes  Estill Bamberg 08/03/2014, 9:05 AM

## 2014-08-03 NOTE — Progress Notes (Signed)
Patient had large, dark, red bowel movement this morning. Patient had another bowel movement, same but smaller. MD Henrene Pastor on unit and made aware of bowels and current status. Monitor also reading out ST changes; performed EKG. MD Henrene Pastor also saw EKG. No new orders placed. Will continue to monitor. IR at bedside speaking with family at this time about upcoming procedure.

## 2014-08-03 NOTE — Plan of Care (Signed)
Problem: Phase I Progression Outcomes Goal: HOB elevated 30 degrees Patients HOB remains elevated above 30 degrees

## 2014-08-03 NOTE — Progress Notes (Deleted)
Patient extubated and nasal cannula placed. Family at bedside.

## 2014-08-03 NOTE — Plan of Care (Signed)
Problem: Phase I Progression Outcomes Goal: Oral Care per Protocol Outcome: Completed/Met Date Met:  08/03/14 Oral care protocol has been initiated and carried out daily

## 2014-08-03 NOTE — Plan of Care (Signed)
Problem: Phase I Progression Outcomes Goal: GIProphysixis Outcome: Completed/Met Date Met:  08/03/14 Patient is on protonix drip

## 2014-08-03 NOTE — H&P (Signed)
Reason for Consult: GI bleed Chief Complaint: Chief Complaint  Patient presents with  . GI Bleeding   Referring Physician(s): GI  History of Present Illness: Martin Morris is a 80 y.o. male with history of mantle cell lymphoma who follows with Dr. Alvy Bimler in the office and is receiving chemotherapy. He presented to ED 12/8 with wife who states patient had went to go to the bathroom and passed out. The patient was noted have vomited multiple times with bright red blood and BM bright red blood. The patient was also on plavix for history of CAD s/p CABG and states he last saw his cardiologist less than 2 weeks ago and has been doing well. He does admit to RLE chronic edema since CABG and vein stripping and new LLE edema within the past 3 months. He denies any chest pain, worsening DOE, lightheadedness or dizziness. GI has seen the patient and the patient underwent an EGD on 12/8 findings of malignant proximal gastric ulcer with acute bleeding s/p endoclipping. IR received request for elective embolization. The patient was extubated this morning and family is in the room today. RN reports continued BM dark red blood and recent ECG with ST changes.   Past Medical History  Diagnosis Date  . Hypertension   . CAD 11/12/2007    a. s/p CABG;  b. Cath 7/11 Patent LIMA to the LAD. Previously known occlusion of a saphenous vein graft to diagonal and sequential to obtuse marginals. Severe diffuse native obtuse marginal and diagonal branch vessel disease. Preserved ejection fraction.;  c. 03/2013 Lexi CL: mild inflat and inf ischemia, EF 54%->initial med rx.  Marland Kitchen HYPERLIPIDEMIA 11/12/2007  . HYPERTENSION 11/12/2007  . PERIPHERAL VASCULAR DISEASE 11/12/2007  . ARTHRITIS 11/12/2007  . BENIGN PROSTATIC HYPERTROPHY, HX OF 11/12/2007  . Edema 10/17/2010  . INGUINAL HERNIAS, BILATERAL 11/10/2006  . Obesity, unspecified 07/05/2009  . SLEEP APNEA 11/12/2007  . History of PTCA 2005  . Osteoarthritis   . Carotid  stenosis, bilateral   . Nephrolithiasis   . thymus ca dx'd 2003    Radiation comp 2003  . NHL (non-Hodgkin's lymphoma) dx'd 2008    Chemo comp 10/2010; rituxin comp 10/2010  . 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 MANTLE CELL LYMPHOMA INTRA-ABDOMINAL LYMPH NODES 11/12/2007  . Tricuspid valve regurgitation     Past Surgical History  Procedure Laterality Date  . Coronary artery bypass graft    . Cardiac catheterization  08/2009, 02/2010  . Total hip arthroplasty Left 12/09/2013    Procedure: TOTAL HIP ARTHROPLASTY;  Surgeon: Mauri Pole, MD;  Location: [facility];  Service: Orthopedics;  Laterality: Left;  . Esophagogastroduodenoscopy N/A 08/02/2014    Procedure: ESOPHAGOGASTRODUODENOSCOPY (EGD) at bedside;  Surgeon: Irene Shipper, MD;  Location: [facility];  Service: Endoscopy;  Laterality: N/A;    Allergies: Atorvastatin  Medications: Prior to Admission medications   Medication Sig Start Date End Date Taking? Authorizing Provider  acyclovir (ZOVIRAX) 400 MG tablet TAKE ONE TABLET BY MOUTH TWICE DAILY Patient taking differently: Take 400 mg by mouth daily. For 6 months then stop 06/20/14  Yes Aasim Marla Roe, MD  amLODipine (NORVASC) 5 MG tablet Take 1 tablet (5 mg total) by mouth daily. 01/12/14  Yes Minus Breeding, MD  aspirin 81 MG tablet Take 81 mg by mouth daily.    Yes Historical Provider, MD  atenolol (TENORMIN) 50 MG tablet Take 1 tablet (50 mg total) by mouth daily. 01/12/14  Yes Minus Breeding, MD  citalopram (CELEXA) 20 MG  tablet TAKE ONE TABLET BY MOUTH ONCE DAILY Patient taking differently: Take 20 mg by mouth daily.  06/20/14  Yes Aasim Marla Roe, MD  clopidogrel (PLAVIX) 75 MG tablet Take 1 tablet (75 mg total) by mouth daily. 01/12/14  Yes Minus Breeding, MD  furosemide (LASIX) 20 MG tablet Take one daily as needed for fluid Patient taking differently: Take 20 mg by mouth as needed for fluid.  04/15/14  Yes Minus Breeding, MD  HYDROcodone-acetaminophen (NORCO/VICODIN) 5-325  MG per tablet Take 1-2 tablets by mouth every 6 (six) hours as needed for moderate pain. 12/10/13  Yes Lucille Passy Babish, PA-C  isosorbide mononitrate (IMDUR) 120 MG 24 hr tablet Take 1 tablet (120 mg total) by mouth daily. 01/12/14  Yes Minus Breeding, MD  lactose free nutrition (BOOST PLUS) LIQD Take 237 mLs by mouth 2 (two) times daily as needed (Allow w/pt or pt's wife request after diet advancement). 12/13/13  Yes Annita Brod, MD  lidocaine-prilocaine (EMLA) cream Apply 1 application topically See admin instructions. Apply to port 1 hour before treatment. Cover site with plastic wrap 03/24/14  Yes Concha Norway, MD  methocarbamol (ROBAXIN) 500 MG tablet Take 1 tablet (500 mg total) by mouth every 6 (six) hours as needed for muscle spasms. 12/10/13  Yes Lucille Passy Babish, PA-C  nitroGLYCERIN (NITROSTAT) 0.4 MG SL tablet Place 1 tablet (0.4 mg total) under the tongue every 5 (five) minutes as needed for chest pain. 01/12/14  Yes Minus Breeding, MD  pravastatin (PRAVACHOL) 80 MG tablet Take 1 tablet (80 mg total) by mouth daily. 01/12/14  Yes Minus Breeding, MD  PRESCRIPTION MEDICATION Inject into the vein every 21 ( twenty-one) days. Velcade and Cytoxan.    Historical Provider, MD  sulfamethoxazole-trimethoprim (BACTRIM DS,SEPTRA DS) 800-160 MG per tablet Take 1 tablet by mouth every Monday, Wednesday, and Friday. Patient not taking: Reported on 08/02/2014 07/20/14   Aasim Marla Roe, MD    Family History  Problem Relation Age of Onset  . Coronary artery disease Mother     died at 29  . Heart attack Sister     died at age 77  . Heart attack Brother     History   Social History  . Marital Status: Married    Spouse Name: N/A    Number of Children: N/A  . Years of Education: N/A   Social History Main Topics  . Smoking status: Never Smoker   . Smokeless tobacco: Never Used  . Alcohol Use: No  . Drug Use: No  . Sexual Activity: None   Other Topics Concern  . None   Social  History Narrative   The patient lives in [place] with his wife. He is a retired Engineer, agricultural. HE is accompanied today by his wife and daughter. No tobacco or alcohol use.   Review of Systems: A 12 point ROS discussed and pertinent positives are indicated in the HPI above.  All other systems are negative.  Review of Systems  Vital Signs: BP 148/70 mmHg  Pulse 78  Temp(Src) 99.9 F (37.7 C) (Axillary)  Resp 20  Ht 5\' 8"  (1.727 m)  Wt 216 lb 11.4 oz (98.3 kg)  BMI 32.96 kg/m2  SpO2 99%  Physical Exam  Constitutional: He is oriented to person, place, and time. No distress.  HENT:  Head: Normocephalic and atraumatic.  Neck: No tracheal deviation present.  Cardiovascular: Normal rate and regular rhythm.  Exam reveals no gallop and no friction rub.   No murmur heard. Pulmonary/Chest:  Effort normal and breath sounds normal. No respiratory distress. He has no wheezes. He has no rales.  Abdominal: Soft. Bowel sounds are normal. He exhibits no distension. There is no tenderness.  Musculoskeletal: He exhibits edema.  Neurological: He is alert and oriented to person, place, and time.  Skin: He is not diaphoretic.  Right chest wall port intact.   Imaging: Ct Head Wo Contrast  08/02/2014   CLINICAL DATA:  Headache to the top of the head. No injury. History of thymus cancer come of non-Hodgkin's lymphoma, mantle cell lymphoma, mass to stomach, colon, and neck. Headache.  EXAM: CT HEAD WITHOUT CONTRAST  TECHNIQUE: Contiguous axial images were obtained from the base of the skull through the vertex without intravenous contrast.  COMPARISON:  None.  FINDINGS: Diffuse cerebral atrophy. Ventricular dilatation consistent with central atrophy. Low-attenuation changes in the deep white matter most likely representing small vessel ischemia. Low-attenuation changes demonstrate somewhat nodular appearance in the centrum semiovale and posterior periventricular regions. While these most likely represent a small  vessel ischemic changes, given the history of metastatic disease, metastasis is not excluded. Consider further evaluation with contrast-enhanced MRI. Note mass effect or midline shift. No abnormal extra-axial fluid collections. Gray-white matter junctions are distinct. Basal cisterns are not effaced. No evidence of acute intracranial hemorrhage. Visualized paranasal sinuses and mastoid air cells are not opacified. Vascular calcifications. Calvarium appears intact.  IMPRESSION: Diffuse atrophy and small vessel ischemic changes. Early intracranial metastases are not excluded. No significant mass effect. No intracranial hemorrhage.   Electronically Signed   By: Lucienne Capers M.D.   On: 08/02/2014 06:08   Nm Pet Image Restag (ps) Skull Base To Thigh  07/27/2014   CLINICAL DATA:  Subsequent Treatment strategy for lymphoma. Restaging examination.  EXAM: NUCLEAR MEDICINE PET SKULL BASE TO THIGH  TECHNIQUE: 10.1 mCi F-18 FDG was injected intravenously. Full-ring PET imaging was performed from the skull base to thigh after the radiotracer. CT data was obtained and used for attenuation correction and anatomic localization.  FASTING BLOOD GLUCOSE:  Value: 83 mg/dl  COMPARISON:  PET-CT 08/22/2010.  FINDINGS: NECK  There is asymmetric soft tissue thickening in the posterior aspect of the nasopharynx bilaterally (left greater than right), which is hypermetabolic (SUVmax = 7.7 on the left and 5.2 on the right). There is also a hypermetabolism in the posterior spinal musculature at the skullbase, anterior spinal musculature on the left, and strap muscles on the left, presumably physiologic. No hypermetabolic lymph nodes in the neck.  CHEST  Compared to prior examinations there are new multifocal areas of nodular pleural thickening which are hypermetabolic (SUVmax = 5.3)ZJ the thorax bilaterally (left greater than right). In addition, there is an irregular-shaped ovoid lesion in the right upper lobe (image 25 of series 6)  measuring approximately 2.5 x 1.4 cm with several tiny satellite nodules which demonstrates some low-level hypermetabolic activity (SUVmax = 4.5). 1.3 cm short axis left hilar lymph node is hypermetabolic (SUVmax = 5.0). No other mediastinal or right hilar lymphadenopathy is noted. Small hiatal hernia with some hypermetabolism in the herniated portion of the cardia of the stomach (SUVmax = 4.6). The remainder the esophagus is otherwise unremarkable in appearance. Heart size is normal. There is no significant pericardial fluid, thickening or pericardial calcification. There is atherosclerosis of the thoracic aorta, the great vessels of the mediastinum and the coronary arteries, including calcified atherosclerotic plaque in the left main, left anterior descending, left circumflex and right coronary arteries. Status post median sternotomy for CABG,  including LIMA to the LAD. Calcifications of the aortic valve. Right-sided internal jugular single-lumen porta cath with tip terminating in the distal superior vena cava.  ABDOMEN/PELVIS  Multiple other foci of hypermetabolism are noted within the gastrointestinal tract, including some amorphous hypermetabolism along the greater curvature of the fundus of the stomach, which corresponds to some mild focal gastric wall thickening (SUVmax = 6.6). Mildly enlarged 1 cm short axis gastrohepatic ligament lymph node demonstrates low-level hypermetabolism (SUVmax = 3.5). In addition, there is some focal thickening in the posterior colonic wall in the proximal descending colon, best appreciated on image 103 of series 4 where there is hypermetabolism (SUVmax = 5.7). There is also some mass like thickening of the cecum adjacent to the ileocecal valve (image 134 of series 4) measuring approximately 3.4 x 2.9 cm demonstrating hypermetabolism (SUVmax = 8.0). No abnormal hypermetabolic activity within the liver, pancreas, adrenal glands, or spleen. 3 mm calcification in the interpolar  region of the left kidney is favored to be vascular, but could represent a nonobstructive calculus. Extensive atherosclerosis throughout the abdominal and pelvic vasculature, without evidence of aneurysm. Numerous colonic diverticulae are noted, particularly in the distal descending colon, without surrounding inflammatory changes to suggest an acute diverticulitis at this time. Moderate left inguinal hernia containing only fat incidentally noted.  SKELETON  No focal hypermetabolic activity to suggest skeletal metastasis. Status post left hip arthroplasty. Median sternotomy wires.  IMPRESSION: 1. Today's study demonstrates multifocal hypermetabolism throughout the neck, chest and abdomen, as detailed above. Sites are highly varied and include posterior nasopharyngeal mucosa (left greater than right), pleura, left hilar lymph node, gastric mucosa, gastrohepatic ligament lymph node and colonic lesions, as above. Recurrent lymphoma could certainly effect any or all of these regions, however, the appearance is somewhat unusual, and in a very different distribution than the patient's original disease which was predominantly extensive retroperitoneal and upper abdominal lymphadenopathy in 2008. Correlation with colonoscopy is suggested to evaluate for potential colonic primary. As well, upper endoscopy is suggested to evaluate the activity within the proximal stomach. 2. In addition to the multifocal pleural lesions, there is an irregular-shaped nodule in the periphery of the right upper lobe which demonstrates some hypermetabolism. Based on the appearance of this lesion, this is favored to be of infectious or inflammatory etiology, however, a site of parenchymal infiltration with lymphoma or primary bronchogenic neoplasm is not excluded and close attention on followup studies is recommended. 3. Atherosclerosis, including left main and 3 vessel coronary artery disease. Status post median sternotomy for CABG, including LIMA  to the LAD. 4. Colonic diverticulosis without findings to suggest acute diverticulitis at this time. 5. Additional incidental findings, as above.   Electronically Signed   By: Vinnie Langton M.D.   On: 07/27/2014 15:08   Portable Chest Xray  08/02/2014   CLINICAL DATA:  Respiratory distress. Acute on chronic respiratory failure.  EXAM: PORTABLE CHEST - 1 VIEW 10:20 a.m.  COMPARISON:  08/02/2014 at 4:48 a.m.  FINDINGS: Endotracheal tube has been inserted and appears in good position approximately 3 cm above the carina. Power port in place with the tip in the superior vena cava. NG tube tip is below the diaphragm.  Heart size and pulmonary vascularity are normal. There is slight increased but minimal atelectasis at the left lung base. There is a vague nodular appearing density laterally in the left midzone which is new since 12/09/2013.  IMPRESSION: Endotracheal tube and NG tube appear in good position. Slight increased but minimal atelectasis at  the left lung base. Possible pulmonary nodule in the left midzone laterally.   Electronically Signed   By: Rozetta Nunnery M.D.   On: 08/02/2014 10:49   Dg Chest Port 1 View  08/02/2014   CLINICAL DATA:  GI bleed, history of lymphoma and weakness.  EXAM: PORTABLE CHEST - 1 VIEW  COMPARISON:  PET-CT July 27, 2014 and chest radiograph December 09, 2013  FINDINGS: Patient is rotated to the RIGHT. Cardiomediastinal silhouette is nonsuspicious, mildly calcified aortic knob. Status post median sternotomy for apparent CABG. Bandlike density in LEFT lung base persists, no pleural effusions. No focal consolidation. No pneumothorax.  Single lumen RIGHT chest Port-A-Cath with distal tip projecting in mid superior vena cava, unchanged. Soft tissue planes and included osseous structures are nonsuspicious; scoliosis.  IMPRESSION: Stable LEFT lung base atelectasis versus scarring.   Electronically Signed   By: Elon Alas   On: 08/02/2014 05:10    Labs:  CBC:  Recent  Labs  08/02/14 1030 08/02/14 1600 08/03/14 0028 08/03/14 0910  WBC 6.6 7.8 8.2 7.7  HGB 8.3* 9.2* 10.0* 9.8*  HCT 25.1* 27.5* 29.9* 29.1*  PLT 117* 162 146* 137*    COAGS:  Recent Labs  12/09/13 0130  INR 1.06    BMP:  Recent Labs  12/10/13 0615 12/11/13 0524  07/11/14 0933 08/01/14 1216 08/02/14 0431 08/03/14 0400  NA 138 141  < > 140 143 142 145  K 4.4 4.2  < > 4.4 3.8 4.2 3.7  CL 102 102  --   --   --  106 111  CO2 27 29  < > 25 28 24 24   GLUCOSE 121* 117*  < > 117 156* 153* 120*  BUN 17 15  < > 19.6 24.5 33* 28*  CALCIUM 8.1* 7.9*  < > 8.8 9.0 8.3* 7.9*  CREATININE 1.05 1.03  < > 1.4* 1.6* 1.42* 1.08  GFRNONAA 65* 67*  --   --   --  45* 63*  GFRAA 76* 78*  --   --   --  53* 73*  < > = values in this interval not displayed.  LIVER FUNCTION TESTS:  Recent Labs  06/20/14 0946 07/11/14 0933 08/01/14 1216 08/02/14 0431  BILITOT 0.83 0.48 0.58 0.3  AST 18 19 18 14   ALT 14 16 12 8   ALKPHOS 86 76 84 60  PROT 6.0* 5.9* 6.0* 4.8*  ALBUMIN 3.6 3.6 3.6 2.6*   Assessment and Plan: History of mantle cell lymphoma, last maintenance chemotherapy 07/11/14, PET 12/2 shows multifocal hypermetabolism, oncology following.  GI bleed, acute blood loss anemia S/p endoclipping 12/8 of gastric ulcer, H/H stable, vitals stable Request for elective visceral arteriogram and embolization with moderate sedation Patient is currently stable and did have liquids including broth, jello and water within the last 2 hour, will make NPO after midnight and plan for procedure on 12/10 per patient's request for sedation.  Labs and EGD reviewed Risks and Benefits discussed with the patient and his family. All questions were answered, patient and his family are agreeable to proceed. Consent signed and in chart. CAD s/p CABG on plavix, stopped 2 days ago-last echo 11/2013 EF 55-60%, normal wall motion, last cardiac stress test 03/2013 MIBI scan with evidence of ischemia small to medium size  inferolateral wall.    Thank you for this interesting consult.  I greatly enjoyed meeting Martin Morris and look forward to participating in their care.    I spent a total of 40  minutes face to face in clinical consultation, greater than 50% of which was counseling/coordinating care   Signed: Hedy Jacob 08/03/2014, 3:42 PM

## 2014-08-03 NOTE — Plan of Care (Signed)
Problem: Phase I Progression Outcomes Goal: Pain controlled with appropriate interventions Outcome: Completed/Met Date Met:  08/03/14 Pain controlled with PRN Fentanyl

## 2014-08-03 NOTE — Progress Notes (Signed)
Patient ID: Martin Morris, male   DOB: 1933-04-26, 80 y.o.   MRN: [id number]  [facility] Progress Note  Subjective: Just extubated, feels ok- denies any pain. No overt  rebleeding overnight.  HGB 10   Objective:  Vital signs in last 24 hours: Temp:  [96.3 F (35.7 C)-100 F (37.8 C)] 98.7 F (37.1 C) (12/09 0740) Pulse Rate:  [57-72] 67 (12/09 0751) Resp:  [8-28] 18 (12/09 0751) BP: (93-147)/(47-75) 104/49 mmHg (12/09 0751) SpO2:  [97 %-100 %] 100 % (12/09 0751) FiO2 (%):  [35 %-100 %] 35 % (12/09 0751) Weight:  [216 lb 11.4 oz (98.3 kg)] 216 lb 11.4 oz (98.3 kg) (12/09 0400) Last BM Date: 08/02/14 General:   Alert,  Well-developed, elderly WM    in NAD Heart:  Regular rate and rhythm; no murmurs Pulm;scattered rhonchi Abdomen:  Soft, nontender and nondistended. Normal bowel sounds, without guarding, and without rebound.   Extremities:  Without edema. Neurologic:  Alert and  oriented x4;  grossly normal neurologically. Psych:  Alert and cooperative. Normal mood and affect.  Intake/Output from previous day: 12/08 0701 - 12/09 0700 In: 3657 [I.V.:2054.5; Blood:1602.5] Out: 1685 [Urine:1685] Intake/Output this shift:    Lab Results:  Recent Labs  08/02/14 1030 08/02/14 1600 08/03/14 0028  WBC 6.6 7.8 8.2  HGB 8.3* 9.2* 10.0*  HCT 25.1* 27.5* 29.9*  PLT 117* 162 146*   BMET  Recent Labs  08/01/14 1216 08/02/14 0431 08/03/14 0400  NA 143 142 145  K 3.8 4.2 3.7  CL  --  106 111  CO2 28 24 24   GLUCOSE 156* 153* 120*  BUN 24.5 33* 28*  CREATININE 1.6* 1.42* 1.08  CALCIUM 9.0 8.3* 7.9*   LFT  Recent Labs  08/02/14 0431  PROT 4.8*  ALBUMIN 2.6*  AST 14  ALT 8  ALKPHOS 60  BILITOT 0.3   PT/INR No results for input(s): LABPROT, INR in the last 72 hours. Hepatitis Panel No results for input(s): HEPBSAG, HCVAB, HEPAIGM, HEPBIGM in the last 72 hours.  Assessment / Plan: #1 80 yo male with acute major GI bleed secondary to malignant  tumor in stomach consistent with lymphoma. S/P EGD  With endoclipping yesterday with hemostasis He is at high risk for recurrent bleeding- endoclips are temporary fix. Would favor IR proceeding with angio and embolization/coiling #2 antiplatelet therapy- off plavix day2- do not plan to restart #3 anemia-acute on chronic Transfuse as needed #4 CAD-s/p CABG- recent cath abnormal #5 Recurrent Lymphoma with wide spread disease- will need oncology input.  Principal Problem:   Acute GI bleeding Active Problems:   Essential hypertension   Acute blood loss anemia   Chronic kidney disease (CKD), stage II (mild)   Bleeding gastrointestinal   Acute on chronic respiratory failure   Lymphoma     LOS: 1 day   Amy Esterwood  08/03/2014, 8:59 AM   GI ATTENDING  Interval history data reviewed. Patient seen and examined. Agree with progress note as outlined above. Chronically stable after endoscopic intervention. Long-term, I feel vascular embolization, if possible, may be helpful. Overall, poor prognosis.  Docia Chuck. Geri Seminole., M.D. [facility] Division of Gastroenterology

## 2014-08-03 NOTE — Plan of Care (Signed)
Problem: Consults Goal: Skin Care Protocol Initiated - if Braden Score 18 or less If consults are not indicated, leave blank or document N/A  Outcome: Completed/Met Date Met:  08/03/14     

## 2014-08-03 NOTE — Progress Notes (Signed)
  Radiation Oncology         [phone] ________________________________  Name: Martin Morris MRN: [id number]  Date: 08/02/2014  DOB: 01-Jun-1933  Chart Note:  At the request of medical oncology, I reviewed this patient's most recent findings.  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 He received 60 Gy to the anterior mediastinum. In the interim, he has been treated for mantle cell lymphoma and presents now with an ulcerated gastric mass producing profound anemia.  Images from endoscopy earlier today:    In this setting, the patient may benefit from localized palliative radiotherapy to the stomach for hemostasis.  There would be no anticipated overlap with his previous radiotherapy shown in the portals below:    He is also scheduled for embolization tomorrow with interventional radiology. I will await completion of the embolization procedure and then meet with the patient and his family about possible palliative radiotherapy, if it remains clinically indicated for hemostasis.   ________________________________  Sheral Apley Tammi Klippel, M.D.

## 2014-08-04 ENCOUNTER — Encounter: Payer: Self-pay | Admitting: *Deleted

## 2014-08-04 LAB — GLUCOSE, CAPILLARY
Glucose-Capillary: 83 mg/dL (ref 70–99)
Glucose-Capillary: 88 mg/dL (ref 70–99)
Glucose-Capillary: 86 mg/dL (ref 70–99)

## 2014-08-04 LAB — BASIC METABOLIC PANEL
Anion gap: 7 (ref 5–15)
BUN: 17 mg/dL (ref 6–23)
CALCIUM: 7.9 mg/dL — AB (ref 8.4–10.5)
CO2: 27 mEq/L (ref 19–32)
CREATININE: 1.09 mg/dL (ref 0.50–1.35)
Chloride: 110 mEq/L (ref 96–112)
GFR calc non Af Amer: 63 mL/min — ABNORMAL LOW (ref >90)
GFR, EST AFRICAN AMERICAN: 73 mL/min — AB (ref >90)
Glucose, Bld: 104 mg/dL — ABNORMAL HIGH (ref 70–99)
Potassium: 3.1 mEq/L — ABNORMAL LOW (ref 3.7–5.3)
Sodium: 144 mEq/L (ref 137–147)

## 2014-08-04 MED ORDER — CETYLPYRIDINIUM CHLORIDE 0.05 % MT LIQD
7.0000 mL | Freq: Two times a day (BID) | OROMUCOSAL | Status: DC
  Administered 2014-08-04 – 2014-08-12 (×14): 7 mL via OROMUCOSAL

## 2014-08-04 MED ORDER — POTASSIUM CHLORIDE 10 MEQ/50ML IV SOLN
10.0000 meq | INTRAVENOUS | Status: AC
  Administered 2014-08-04 (×3): 10 meq via INTRAVENOUS
  Filled 2014-08-04 (×3): qty 50

## 2014-08-04 NOTE — Progress Notes (Signed)
Patient ID: Martin Morris, male   DOB: 1933-07-11, 80 y.o.   MRN: [id number] Patient has been stable and no signs of recurrent upper GI bleeding.  Elective angiography with possible embolization was not able to be performed today.  I explained the arteriography and embolization procedure with the patient and wife in depth.  They understand that it may be difficult to embolize the gastric lesion based on its location but it will depend on the angiography images.  Will plan for angiography tomorrow.

## 2014-08-04 NOTE — Progress Notes (Signed)
[place] Physician Progress Note and Electrolyte Replacement  Patient Name: Martin Morris DOB: Dec 18, 1932 MRN: [id number]  Date of Service  08/04/2014   HPI/Events of Note    Recent Labs Lab 08/01/14 1216  08/02/14 0431 08/03/14 0400 08/04/14 0405  NA 143  --  142 145 144  K 3.8  < > 4.2 3.7 3.1*  CL  --   --  106 111 110  CO2 28  --  24 24 27   GLUCOSE 156*  --  153* 120* 104*  BUN 24.5  --  33* 28* 17  CREATININE 1.6*  --  1.42* 1.08 1.09  CALCIUM 9.0  --  8.3* 7.9* 7.9*  < > = values in this interval not displayed.  Estimated Creatinine Clearance: 62.5 mL/min (by C-G formula based on Cr of 1.09).  Intake/Output      12/09 0701 - 12/10 0700   P.O. 320   I.V. (mL/kg) 2300 (23.4)   Total Intake(mL/kg) 2620 (26.7)   Urine (mL/kg/hr) 2125 (0.9)   Stool 1 (0)   Total Output 2126   Net +494       Stool Occurrence 2 x    - I/O DETAILED x 24h    Total I/O In: 1100 [I.V.:1100] Out: 1401 [Urine:1400; Stool:1] - I/O THIS SHIFT    ASSESSMENT Low K  eICURN Interventions  IV KCL 88meq x 3 runs   ASSESSMENT: MAJOR ELECTROLYTE      Dr. Brand Males, M.D., [facility].C.P Pulmonary and Critical Care Medicine Staff Physician [facility] Pager: [phone], If no answer or between  15:00h - 7:00h: call [phone]  08/04/2014 6:43 AM

## 2014-08-04 NOTE — Progress Notes (Signed)
Patient ID: DESHON HSIAO, male   DOB: 09-19-1932, 80 y.o.   MRN: [id number]  [facility] Progress Note  Subjective: Doing well, no complaints of abdominal pain. No N/V. One BM yesterday. Scheduled for IR angio and embolization /coiling today, then oncology planning radiation  Objective:  Vital signs in last 24 hours: Temp:  [98.8 F (37.1 C)-100 F (37.8 C)] 99.1 F (37.3 C) (12/10 0800) Pulse Rate:  [64-83] 67 (12/10 0800) Resp:  [0-22] 15 (12/10 0800) BP: (116-154)/(51-79) 144/79 mmHg (12/10 0800) SpO2:  [92 %-100 %] 92 % (12/10 0800) Last BM Date: 08/03/14 General:   Alert,  Well-developed, elderly WM    in NAD,wife at bedside Heart:  Regular rate and rhythm; no murmurs Pulm;clear ant Abdomen:  Soft, large ,nontender and nondistended. Normal bowel sounds, without guarding, and without rebound.   Extremities:  Without edema. Neurologic:  Alert and  oriented x4;  grossly normal neurologically. Psych:  Alert and cooperative. Normal mood and affect.  Intake/Output from previous day: 12/09 0701 - 12/10 0700 In: 2620 [P.O.:320; I.V.:2300] Out: 2126 [Urine:2125; Stool:1] Intake/Output this shift:    Lab Results:  Recent Labs  08/03/14 0028 08/03/14 0910 08/03/14 1600  WBC 8.2 7.7 7.7  HGB 10.0* 9.8* 9.7*  HCT 29.9* 29.1* 29.7*  PLT 146* 137* 143*   BMET  Recent Labs  08/02/14 0431 08/03/14 0400 08/04/14 0405  NA 142 145 144  K 4.2 3.7 3.1*  CL 106 111 110  CO2 24 24 27   GLUCOSE 153* 120* 104*  BUN 33* 28* 17  CREATININE 1.42* 1.08 1.09  CALCIUM 8.3* 7.9* 7.9*   LFT  Recent Labs  08/02/14 0431  PROT 4.8*  ALBUMIN 2.6*  AST 14  ALT 8  ALKPHOS 60  BILITOT 0.3   PT/INR  Recent Labs  08/03/14 1444  LABPROT 15.7*  INR 1.23      Assessment / Plan: #1  80 yo male  With recurrent mantle cell lymphoma widespread disease,with acute  life threatening GI bleed 12/7 secondary to ulcerated gastric mass. EGD and endoclipping for  hemostasis 12/8. Very high risk of recurrent hemorrhage  Therefore to have angio and embolization today per IR . He will be off plavix permanently  He is stable, no further transfusion requirement Being followed by CCM, and oncology Continue PPI BID We will sign off, but are available if can help this very nice gentleman. Principal Problem:   Acute GI bleeding Active Problems:   Essential hypertension   Acute blood loss anemia   Chronic kidney disease (CKD), stage II (mild)   Bleeding gastrointestinal   Acute on chronic respiratory failure   Lymphoma     LOS: 2 days   Amy Esterwood  08/04/2014, 8:59 AM  GI ATTENDING  Interval history and data reviewed. Patient seen and examined. Agree with progress note as above. Stable from GI standpoint. Agree with interventional radiology intervention. Nothing further to offer from GI standpoint were he to rebleed. Hopefully he will not. We will sign off. Thank you

## 2014-08-04 NOTE — Progress Notes (Signed)
Martin Morris   DOB:Aug 31, 1932   ZO#:[id number]   JWJ#:[id number]  Patient Care Team: Dione Housekeeper, MD as PCP - General (Family Medicine) Heath Lark, MD as Consulting Physician (Hematology and Oncology) I have seen the patient, examined him and edited the notes as follows  Subjective: Patient seen and examined. No new events overnight. No bleeding issues reported. He had 1 bowel movement last night, with dark stools, but no overt blood. He is alert, conversant and denies any other medical problems. Denies headaches or vision problems.  He is ready to undergo embolization procedure to the gastric ulcer this morning for the resolution of bleeding.   Scheduled Meds: . sodium chloride   Intravenous Once  . sodium chloride   Intravenous Once  . antiseptic oral rinse  7 mL Mouth Rinse QID  . chlorhexidine  15 mL Mouth Rinse BID  . [START ON 08/05/2014] pantoprazole (PROTONIX) IV  40 mg Intravenous Q12H  . potassium chloride  10 mEq Intravenous Q1 Hr x 3   Continuous Infusions: . sodium chloride 75 mL/hr at 08/03/14 1351  . pantoprozole (PROTONIX) infusion 8 mg/hr (08/03/14 1700)   PRN Meds:acetaminophen **OR** acetaminophen, nitroGLYCERIN, ondansetron **OR** ondansetron (ZOFRAN) IV   Objective:  Filed Vitals:   08/04/14 0700  BP: 148/73  Pulse: 73  Temp: 99.1 F (37.3 C)  Resp: 18      Intake/Output Summary (Last 24 hours) at 08/04/14 0745 Last data filed at 08/04/14 0600  Gross per 24 hour  Intake   2620 ml  Output   2126 ml  Net    494 ml    ECOG PERFORMANCE STATUS: 3  GENERAL:alert, no distress and ill appearing SKIN: skin color, texture, turgor are normal, no rashes or significant lesions. Chronic venous stasis changes EYES: normal, conjunctiva are pink and non-injected, sclera clear OROPHARYNX:no exudate, no erythema and lips, buccal mucosa, and tongue normal  NECK: supple, thyroid normal size, non-tender, without nodularity LYMPH: no palpable lymphadenopathy in  the cervical, axillary or inguinal LUNGS:trace of rhonchi anteriorly without wheezes or rales, with normal breathing effort HEART: regular rate & rhythm and no murmurs and bilateral lower extremity edema with venous stasis changes ABDOMEN:abdomen soft, non-tender and normal bowel sounds Musculoskeletal:no cyanosis of digits and no clubbing  PSYCH: alert & oriented x 3 with fluent speech NEURO: no focal motor/sensory deficits     CBG (last 3)   Recent Labs  08/03/14 0738 08/03/14 1142 08/03/14 1942  GLUCAP 108* 102* 99     Labs:   Recent Labs Lab 08/01/14 1215 08/02/14 0431  08/02/14 1030 08/02/14 1600 08/03/14 0028 08/03/14 0910 08/03/14 1600  WBC 6.9 8.8  < > 6.6 7.8 8.2 7.7 7.7  HGB 11.8* 8.4*  < > 8.3* 9.2* 10.0* 9.8* 9.7*  HCT 36.3* 25.1*  < > 25.1* 27.5* 29.9* 29.1* 29.7*  PLT 196 166  < > 117* 162 146* 137* 143*  MCV 101.1* 101.6*  < > 94.0 92.3 89.5 89.8 91.4  MCH 32.9 34.0  < > 31.1 30.9 29.9 30.2 29.8  MCHC 32.5 33.5  < > 33.1 33.5 33.4 33.7 32.7  RDW 13.5 13.6  < > 17.9* 18.2* 18.9* 19.3* 19.2*  LYMPHSABS 2.1 2.5  --   --   --   --   --   --   MONOABS 0.7 0.9  --   --   --   --   --   --   EOSABS 0.3 0.3  --   --   --   --   --   --  BASOSABS 0.0 0.0  --   --   --   --   --   --   < > = values in this interval not displayed.   Chemistries:    Recent Labs Lab 08/01/14 1216 08/02/14 0431 08/03/14 0400 08/04/14 0405  NA 143 142 145 144  K 3.8 4.2 3.7 3.1*  CL  --  106 111 110  CO2 28 24 24 27   GLUCOSE 156* 153* 120* 104*  BUN 24.5 33* 28* 17  CREATININE 1.6* 1.42* 1.08 1.09  CALCIUM 9.0 8.3* 7.9* 7.9*  AST 18 14  --   --   ALT 12 8  --   --   ALKPHOS 84 60  --   --   BILITOT 0.58 0.3  --   --     GFR Estimated Creatinine Clearance: 62.5 mL/min (by C-G formula based on Cr of 1.09).  Liver Function Tests:  Recent Labs Lab 08/01/14 1216 08/02/14 0431  AST 18 14  ALT 12 8  ALKPHOS 84 60  BILITOT 0.58 0.3  PROT 6.0*  4.8*  ALBUMIN 3.6 2.6*    Coagulation profile  Recent Labs Lab 08/03/14 1444  INR 1.23    Cardiac Enzymes:  Recent Labs Lab 08/02/14 0542  TROPONINI <0.30   BNP: Invalid input(s): POCBNP CBG:  Recent Labs Lab 08/03/14 0004 08/03/14 0613 08/03/14 0738 08/03/14 1142 08/03/14 1942  GLUCAP 89 120* 108* 102* 99    Imaging Studies:  Portable Chest Xray  08/02/2014   CLINICAL DATA:  Respiratory distress. Acute on chronic respiratory failure.  EXAM: PORTABLE CHEST - 1 VIEW 10:20 a.m.  COMPARISON:  08/02/2014 at 4:48 a.m.  FINDINGS: Endotracheal tube has been inserted and appears in good position approximately 3 cm above the carina. Power port in place with the tip in the superior vena cava. NG tube tip is below the diaphragm.  Heart size and pulmonary vascularity are normal. There is slight increased but minimal atelectasis at the left lung base. There is a vague nodular appearing density laterally in the left midzone which is new since 12/09/2013.  IMPRESSION: Endotracheal tube and NG tube appear in good position. Slight increased but minimal atelectasis at the left lung base. Possible pulmonary nodule in the left midzone laterally.   Electronically Signed   By: Rozetta Nunnery M.D.   On: 08/02/2014 10:49    Assessment/Plan: 80 y.o.  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 He receives Rituxan every alternate cycle. Last chemotherapy was given on 07/11/14, cycle #81. The pattern of recurrence is highly unusual. PET scan on 12/2 shows multifocal hypermetabolism throughout the neck, chest and abdomen. CT of the head shows possible early metastatic disease. His case will be reviewed at the next hematology tumor board for treatment recommendations. The patient may need repeat biopsy. If repeat biopsy confirmed recurrence of mantle cell lymphoma, the current guidelines suggested treatment with  Ibrutinib. His scheduled chemo for 12/7 was cancelled until further notice  Anemia In the setting of acute GI bleed, chronic disease and chronic kidney disease Hb on admission was 7.4 requiring 2 units of blood with good response. He underwent Upper Endoscopy, with visualization of a malignant ulcer was seen and clipped with resolution of bleeding at this time.  He is to undergo angiogram with embolization and coiling by IR today Dr. Tammi Klippel consulted patient on 12/9 for possible radiation therapy to help with the malignant bleeding ulcer.  He will await completion of the embolization procedure to determine candidacy for palliative radiotherapy Continue to transfuse to maintain a Hb above 7, or to 8 if patient remains symptomatic. Appreciate GI and PCCM involvement.   Chronic kidney disease (CKD), stage II (mild) This is chronic in nature, unlikely related to his cancer. Recommend close observation. Baseline is 1.4 His acute renal failure is now resolved  Headaches with abnormal head imaging The findings on his CT scan are not too impressive. Once the patient is most stable, MRI of the head would be indicated to exclude malignancy.  Full Code  Other medical issues including Symptomatic bradycardia in setting of syncopal event, Obstructive sleep apnea, hyperglycemia and transient acute encephalopathy as per admitting team   Other medical issues as per admitting team     **Disclaimer: This note was dictated with voice recognition software. Similar sounding words can inadvertently be transcribed and this note may contain transcription errors which may not have been corrected upon publication of note.Sharene Butters E, PA-C 08/04/2014  7:45 AM  Mathis Cashman, MD 08/04/2014

## 2014-08-04 NOTE — Progress Notes (Signed)
PULMONARY / CRITICAL CARE MEDICINE   Name: Martin Morris MRN: [id number] DOB: 1932-11-25    ADMISSION DATE:  08/02/2014 CONSULTATION DATE:  12/9  REFERRING MD :  Charlies Silvers   CHIEF COMPLAINT:  Acute UGIB   INITIAL PRESENTATION:  80 year old male undergoing active chemo-therapy for Mantle cell lymphoma. Most recent PET scan demonstrated diffuse progression of disease (see study section below), most notably tumor in fundus of stomach. Admitted to [facility] on 12/8 w/ acute UGIB, treated w/ PPI gtt and blood x-fusion. PCCM asked to consult later that am due to bradycardia and syncopal event following episode of hematemesis.    STUDIES:  PET scan 12/2: most recent PET scan shows extensive hypermetabolic foci.. Including: area which likely represents tumor in the fundus of the stomach, descending colon and cecum . Also has hypermetabolism throughout the neck, chest and abdomen, as detailed above. Sites are highlyvaried and include posterior nasopharyngeal mucosa (left greater than right), pleura, left hilar lymph node, gastric mucosa, gastrohepatic ligament lymph node and colonic lesions CT head 12/8: Diffuse atrophy and small vessel ischemic changes. Early intracranial metastases are not excluded. No significant mass effect. No intracranial hemorrhage.  SIGNIFICANT EVENTS: 12/8 EGD-malignant gastric ulcer - clipping  SUBJECTIVE:  C/o sore throat Not in acute distress.  Afebrile No hematemesis or malena  VITAL SIGNS: Temp:  [98.8 F (37.1 C)-100 F (37.8 C)] 99.1 F (37.3 C) (12/10 0800) Pulse Rate:  [64-83] 67 (12/10 0800) Resp:  [0-21] 15 (12/10 0800) BP: (116-154)/(51-79) 144/79 mmHg (12/10 0800) SpO2:  [92 %-100 %] 92 % (12/10 0800) HEMODYNAMICS:   VENTILATOR SETTINGS:   INTAKE / OUTPUT:  Intake/Output Summary (Last 24 hours) at 08/04/14 0539 Last data filed at 08/04/14 0600  Gross per 24 hour  Intake   2420 ml  Output   1926 ml  Net    494 ml    PHYSICAL  EXAMINATION: General:  80 year old male,  pale  Neuro:  Awake, oriented  HEENT:  Awake, alert, no focal def  Cardiovascular:  rrr Lungs:  Clear  Abdomen:  Soft, non-tender + bowel sound  Musculoskeletal:  weak Skin:  Bilateral LE edema w/ chronic venous stasis changes   LABS:  CBC  Recent Labs Lab 08/03/14 0028 08/03/14 0910 08/03/14 1600  WBC 8.2 7.7 7.7  HGB 10.0* 9.8* 9.7*  HCT 29.9* 29.1* 29.7*  PLT 146* 137* 143*   Coag's  Recent Labs Lab 08/03/14 1444  INR 1.23   BMET  Recent Labs Lab 08/02/14 0431 08/03/14 0400 08/04/14 0405  NA 142 145 144  K 4.2 3.7 3.1*  CL 106 111 110  CO2 24 24 27   BUN 33* 28* 17  CREATININE 1.42* 1.08 1.09  GLUCOSE 153* 120* 104*   Electrolytes  Recent Labs Lab 08/02/14 0431 08/03/14 0400 08/04/14 0405  CALCIUM 8.3* 7.9* 7.9*   Sepsis Markers No results for input(s): LATICACIDVEN, PROCALCITON, O2SATVEN in the last 168 hours. ABG  Recent Labs Lab 08/02/14 1055  PHART 7.365  PCO2ART 41.3  PO2ART 325.0*   Liver Enzymes  Recent Labs Lab 08/01/14 1216 08/02/14 0431  AST 18 14  ALT 12 8  ALKPHOS 84 60  BILITOT 0.58 0.3  ALBUMIN 3.6 2.6*   Cardiac Enzymes  Recent Labs Lab 08/02/14 0542  TROPONINI <0.30   Glucose  Recent Labs Lab 08/02/14 1823 08/03/14 0004 08/03/14 0613 08/03/14 0738 08/03/14 1142 08/03/14 1942  GLUCAP 92 89 120* 108* 102* 99    Imaging No results  found.   ASSESSMENT / PLAN:  PULMONARY OETT 12/8>> 12/9 A:  H/o OSA Acute resp failure , intubated for EGD P:   extubated  CARDIOVASCULAR CVL: right port  A:  Symptomatic bradycardia in setting of syncopal event Hemorrhagic shock -resolved P:  Hold all antihypertensives Hold ASA & plavix  RENAL A:   CKD stage II w/ acute on chronic AKI (baseline scr 1.4 range). Improved  Hypokalemia P:   Avoid hypotension Hold antihypertensives Strict I&O  GASTROINTESTINAL A:   Acute UGIB. malignant gastric  ulcer Epistaxis  P:   GI following PPI gtt  IR embolisation planned   HEMATOLOGIC A:   Mantle cell lymphoma of intra-abd LNs stage IV w/ disease progression (see PET scan report above) Chronic anemia; Macrocytic  Acute blood loss anemia  Mild thrombocytopenia P:  Transfuse for hgb goal of at least > 8 in setting of active bleeding Transfused  platelets  Radiation planned    INFECTIOUS A: no acute  P:   Trend fever curve  ENDOCRINE A:  Mild hyperglycemia  P:   ssi protocol   NEUROLOGIC A:   Transient acute encephalopathy (due to syncopal event w/ GIB)-->resolved.  Early brain mets  P:   RASS goal: 0 Supportive care  Hold all sedating meds    FAMILY  - Updates: family updated12/10  - Inter-disciplinary family meet or Palliative Care meeting due by:  12/13  PCCM to sign off , pl re-address code status prior to discharge   [facility] V. MD   08/04/2014, 9:23 AM

## 2014-08-05 ENCOUNTER — Inpatient Hospital Stay (HOSPITAL_COMMUNITY): Payer: Medicare Other

## 2014-08-05 DIAGNOSIS — R579 Shock, unspecified: Secondary | ICD-10-CM

## 2014-08-05 DIAGNOSIS — R001 Bradycardia, unspecified: Secondary | ICD-10-CM

## 2014-08-05 DIAGNOSIS — D638 Anemia in other chronic diseases classified elsewhere: Secondary | ICD-10-CM

## 2014-08-05 DIAGNOSIS — C169 Malignant neoplasm of stomach, unspecified: Secondary | ICD-10-CM

## 2014-08-05 DIAGNOSIS — R55 Syncope and collapse: Secondary | ICD-10-CM

## 2014-08-05 DIAGNOSIS — D696 Thrombocytopenia, unspecified: Secondary | ICD-10-CM

## 2014-08-05 LAB — CBC
HCT: 29.7 % — ABNORMAL LOW (ref 39.0–52.0)
HEMATOCRIT: 27.8 % — AB (ref 39.0–52.0)
Hemoglobin: 9.5 g/dL — ABNORMAL LOW (ref 13.0–17.0)
Hemoglobin: 9.9 g/dL — ABNORMAL LOW (ref 13.0–17.0)
MCH: 31.3 pg (ref 26.0–34.0)
MCH: 30.6 pg (ref 26.0–34.0)
MCHC: 34.2 g/dL (ref 30.0–36.0)
MCHC: 33.3 g/dL (ref 30.0–36.0)
MCV: 91.4 fL (ref 78.0–100.0)
MCV: 91.7 fL (ref 78.0–100.0)
Platelets: 131 10*3/uL — ABNORMAL LOW (ref 150–400)
Platelets: 150 10*3/uL (ref 150–400)
RBC: 3.04 MIL/uL — ABNORMAL LOW (ref 4.22–5.81)
RBC: 3.24 MIL/uL — ABNORMAL LOW (ref 4.22–5.81)
RDW: 17.5 % — AB (ref 11.5–15.5)
RDW: 17.2 % — ABNORMAL HIGH (ref 11.5–15.5)
WBC: 7.3 10*3/uL (ref 4.0–10.5)
WBC: 9.6 10*3/uL (ref 4.0–10.5)

## 2014-08-05 LAB — GLUCOSE, CAPILLARY
GLUCOSE-CAPILLARY: 137 mg/dL — AB (ref 70–99)
GLUCOSE-CAPILLARY: 97 mg/dL (ref 70–99)

## 2014-08-05 MED ORDER — MIDAZOLAM HCL 2 MG/2ML IJ SOLN
INTRAMUSCULAR | Status: AC | PRN
  Administered 2014-08-05 (×2): 0.5 mg via INTRAVENOUS
  Administered 2014-08-05: 1 mg via INTRAVENOUS

## 2014-08-05 MED ORDER — FENTANYL CITRATE 0.05 MG/ML IJ SOLN
INTRAMUSCULAR | Status: AC
  Filled 2014-08-05: qty 4

## 2014-08-05 MED ORDER — LIDOCAINE HCL 1 % IJ SOLN
INTRAMUSCULAR | Status: AC
  Filled 2014-08-05: qty 20

## 2014-08-05 MED ORDER — FENTANYL CITRATE 0.05 MG/ML IJ SOLN
INTRAMUSCULAR | Status: AC | PRN
  Administered 2014-08-05: 50 ug via INTRAVENOUS
  Administered 2014-08-05: 25 ug via INTRAVENOUS

## 2014-08-05 MED ORDER — MIDAZOLAM HCL 2 MG/2ML IJ SOLN
INTRAMUSCULAR | Status: AC
  Filled 2014-08-05: qty 6

## 2014-08-05 MED ORDER — IOHEXOL 300 MG/ML  SOLN
80.0000 mL | Freq: Once | INTRAMUSCULAR | Status: AC | PRN
  Administered 2014-08-05: 100 mL via INTRA_ARTERIAL

## 2014-08-05 NOTE — Procedures (Signed)
Mesenteric arteriogram, coil embo R gastroepiploic artery No complication No blood loss. See complete dictation in [facility].

## 2014-08-05 NOTE — Progress Notes (Signed)
Martin Morris   DOB:1933-02-24   KZ#:[id number]   LTJ#:[id number]  Patient Care Team: Dione Housekeeper, MD as PCP - General (Family Medicine) Heath Lark, MD as Consulting Physician (Hematology and Oncology) I have seen the patient, examined him and edited the notes as follows  Subjective: Patient seen and examined. No new events overnight. No bleeding issues reported. Stools without overt blood. He is alert, conversant and denies any other medical problems. Denies headaches or vision problems. Due to scheduling issues, his embolization to the gastric ulcer had to be postponed until today. He is eager to undergo procedure.  Scheduled Meds: . sodium chloride   Intravenous Once  . sodium chloride   Intravenous Once  . antiseptic oral rinse  7 mL Mouth Rinse BID  . pantoprazole (PROTONIX) IV  40 mg Intravenous Q12H   Continuous Infusions: . sodium chloride 75 mL/hr at 08/05/14 0248   PRN Meds:acetaminophen **OR** acetaminophen, nitroGLYCERIN, ondansetron **OR** ondansetron (ZOFRAN) IV   Objective:  Filed Vitals:   08/05/14 0600  BP: 153/77  Pulse: 82  Temp: 99.7 F (37.6 C)  Resp: 19      Intake/Output Summary (Last 24 hours) at 08/05/14 0700 Last data filed at 08/05/14 0600  Gross per 24 hour  Intake   2400 ml  Output   1700 ml  Net    700 ml    ECOG PERFORMANCE STATUS: 2-3  GENERAL:alert, no distress and ill appearing SKIN: skin color, texture, turgor are normal, no rashes or significant lesions. Chronic venous stasis changes EYES: normal, conjunctiva are pink and non-injected, sclera clear OROPHARYNX:no exudate, no erythema and lips, buccal mucosa, and tongue normal  NECK: supple, thyroid normal size, non-tender, without nodularity LYMPH: no palpable lymphadenopathy in the cervical, axillary or inguinal LUNGS:trace of rhonchi anteriorly without wheezes or rales, with normal breathing effort HEART: regular rate & rhythm and no murmurs and bilateral lower extremity  edema with venous stasis changes ABDOMEN:abdomen soft, non-tender and normal bowel sounds Musculoskeletal:no cyanosis of digits and no clubbing  PSYCH: alert & oriented x 3 with fluent speech NEURO: no focal motor/sensory deficits     CBG (last 3)   Recent Labs  08/04/14 0548 08/04/14 1139 08/04/14 1637  GLUCAP 83 88 86     Labs:   Recent Labs Lab 08/01/14 1215 08/02/14 0431  08/02/14 1600 08/03/14 0028 08/03/14 0910 08/03/14 1600 08/05/14 0425  WBC 6.9 8.8  < > 7.8 8.2 7.7 7.7 7.3  HGB 11.8* 8.4*  < > 9.2* 10.0* 9.8* 9.7* 9.5*  HCT 36.3* 25.1*  < > 27.5* 29.9* 29.1* 29.7* 27.8*  PLT 196 166  < > 162 146* 137* 143* 131*  MCV 101.1* 101.6*  < > 92.3 89.5 89.8 91.4 91.4  MCH 32.9 34.0  < > 30.9 29.9 30.2 29.8 31.3  MCHC 32.5 33.5  < > 33.5 33.4 33.7 32.7 34.2  RDW 13.5 13.6  < > 18.2* 18.9* 19.3* 19.2* 17.5*  LYMPHSABS 2.1 2.5  --   --   --   --   --   --   MONOABS 0.7 0.9  --   --   --   --   --   --   EOSABS 0.3 0.3  --   --   --   --   --   --   BASOSABS 0.0 0.0  --   --   --   --   --   --   < > = values in this  interval not displayed.   Chemistries:    Recent Labs Lab 08/01/14 1216 08/02/14 0431 08/03/14 0400 08/04/14 0405  NA 143 142 145 144  K 3.8 4.2 3.7 3.1*  CL  --  106 111 110  CO2 28 24 24 27   GLUCOSE 156* 153* 120* 104*  BUN 24.5 33* 28* 17  CREATININE 1.6* 1.42* 1.08 1.09  CALCIUM 9.0 8.3* 7.9* 7.9*  AST 18 14  --   --   ALT 12 8  --   --   ALKPHOS 84 60  --   --   BILITOT 0.58 0.3  --   --     GFR Estimated Creatinine Clearance: 62.5 mL/min (by C-G formula based on Cr of 1.09).  Liver Function Tests:  Recent Labs Lab 08/01/14 1216 08/02/14 0431  AST 18 14  ALT 12 8  ALKPHOS 84 60  BILITOT 0.58 0.3  PROT 6.0* 4.8*  ALBUMIN 3.6 2.6*    Coagulation profile  Recent Labs Lab 08/03/14 1444  INR 1.23    Cardiac Enzymes:  Recent Labs Lab 08/02/14 0542  TROPONINI <0.30   BNP: Invalid input(s):  POCBNP CBG:  Recent Labs Lab 08/03/14 1142 08/03/14 1942 08/04/14 0548 08/04/14 1139 08/04/14 1637  GLUCAP 102* 99 83 88 86    Imaging Studies:  No results found.  Assessment/Plan: 80 y.o.  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 He receives Rituxan every alternate cycle. Last chemotherapy was given on 07/11/14, cycle #81. The pattern of recurrence is highly unusual. PET scan on 12/2 shows multifocal hypermetabolism throughout the neck, chest and abdomen. CT of the head shows possible early metastatic disease. His case will be reviewed at the next hematology tumor board for treatment recommendations. The patient may need repeat biopsy. If repeat biopsy confirmed recurrence of mantle cell lymphoma, the current guidelines suggested treatment with Ibrutinib. His scheduled chemo for 12/7 was cancelled until further notice  Anemia In the setting of acute GI bleed, chronic disease and chronic kidney disease Hb on admission was 7.4 requiring 2 units of blood with good response. He underwent Upper Endoscopy, with visualization of a malignant ulcer was seen and clipped with resolution of bleeding at this time.  He is to undergo angiogram with embolization and coiling by IR today Dr. Tammi Klippel consulted patient on 12/9 for possible radiation therapy to help with the malignant bleeding ulcer. He will await completion of the embolization procedure to determine candidacy for palliative radiotherapy Continue to transfuse to maintain a Hb above 7, or to 8 if patient remains symptomatic. Appreciate GI and PCCM involvement.  Thrombocytopenia, mild Due to acute bleed, dilution, malignancy and polypharmacy He received 1 unit of platelets on 12/8 due to bleeding Continue to monitor. Avoid Heparin products. No transfusion is indicated at this time as bleeding resolved  Chronic kidney disease (CKD), stage II  (mild) This is chronic in nature, unlikely related to his cancer. Recommend close observation. Baseline is 1.4 His acute renal failure is now resolved  Headaches with abnormal head imaging The findings on his CT scan are not too impressive. Once the patient is most stable, MRI of the head would be indicated to exclude malignancy.  Full Code  Other medical issues including Symptomatic bradycardia in setting of syncopal event, Obstructive sleep apnea, as per admitting team   **Disclaimer: This note was dictated with voice recognition software. Similar sounding words can inadvertently be transcribed and this note  may contain transcription errors which may not have been corrected upon publication of note.** WERTMAN,SARA E, PA-C 08/05/2014  7:00 AM  Seniya Stoffers, MD 08/05/2014

## 2014-08-05 NOTE — Progress Notes (Signed)
[facility] PROGRESS NOTE  Martin Morris JSE:[id number] DOB: 1933-07-07 DOA: 08/02/2014 PCP: [name], MD  Assessment/Plan: #1 hemorrhagic shock/upper GI bleed Secondary to upper GI bleed secondary to malignant proximal gastric ulcer per EGD 08/02/2014 status post Endo Clip 3. Patient status post mesenteric arteriogram with coil embolization of the right gastroepiploic artery. Interventional radiology 08/05/2014. Hemoglobin currently stable. Patient status post 4 unit packed red blood cells and 1 unit platelets(08/02/14). Shock has resolved and blood pressure stable. Continue PPI. Continue to hold aspirin and Plavix. Continue to hold antihypertensive medications.  #2 upper GI bleed/acute blood loss anemia Secondary to malignant proximal gastric ulcer status post Endo Clip 3 (08/02/2014). Patient is also status post mesenteric arteriogram with coil embolization right gastroepiploic artery per Interventional radiology 08/05/2014. Patient status post 4 units packed red blood cells and 1 unit platelets 08/02/2014. Hemoglobin currently stable. Continue PPI every 12 hours. Continue to hold aspirin and Plavix. Keep hemoglobin greater than 8.  #3 malignant proximal gastric ulcer Status post Endo Clip 3 08/02/2014. Status post mesenteric arteriogram with coil embolization right gastroepiploic artery per interventional radiology 08/05/2014. Radiation oncology has been consulted and saw patient 08/03/2014 for possible radiation therapy however are awaiting completion of embolization procedure to determine candidate before meals for palliative radiotherapy. Oncology following.  #4 acute respiratory failure Patient was intubated for EGD. Patient has been extubated. Respiratory status stable. Resolved.  #5 symptomatic bradycardia in the setting of syncopal event Likely secondary to hemorrhagic shock. Antihypertensives on hold. Aspirin and Plavix on hold. Resolved.  #6 acute on chronic  kidney disease stage II Baseline creatinine 1.4. Likely prerenal azotemia secondary to hemorrhagic shock. Improved.   #7 hypokalemia Check a basic metabolic profile.  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 CT of the head shows possible early metastatic disease. Oncology following. Per oncology.  #9 thrombocytopenia Likely multifactorial in nature secondary to acute bleed, dilutional, malignancy. Patient is status post 1 unit platelets on 08/02/2014 secondary to bleeding. Last set of platelets was 131.. No active bleeding. Follow.  #10 acute encephalopathy Likely secondary to syncopal event secondary to hemorrhagic shock.??? Early brain metastases. Improving. Once patient is more stable consider MRI of the head to rule out brain metastases. Hold sedating medications.  #11 hypertension Hold antihypertensive medications secondary to problem #1.  #12 prophylaxis PPI for GI prophylaxis. SCDs for DVT prophylaxis.     Code Status: Full Family Communication: Updated patient and wife at bedside. Disposition Plan: Remain in step down   Consultants:  PCCM Dr. Elsworth Soho 08/02/2014  Gastroenterology: Dr. Henrene Pastor 08/02/2014  Oncology: Dr. Alvy Bimler 08/03/2014  Interventional radiology: Dr. Anselm Pancoast 08/04/2014  Radiation/ oncology: Dr. Tammi Klippel 08/03/2014  Procedures:  Chest x-ray 08/02/2014  CT head 08/02/2014  Upper endoscopy 08/02/2014-Dr. Henrene Pastor  IR mesenteric arteriogram, coil embolization right gastroepiploic artery per interventional radiology Dr. Vernard Gambles 08/05/2014  Intubation 08/02/2014>>> extubation 08/03/2014  Antibiotics:  None  HPI/Subjective: Patient denies any hematemesis. No further bleeding. No chest pain. No shortness of breath.  Objective: Filed Vitals:   08/05/14 2200  BP: 136/75  Pulse: 97  Temp: 100.4 F (38 C)   Resp: 19    Intake/Output Summary (Last 24 hours) at 08/05/14 2223 Last data filed at 08/05/14 2200  Gross per 24 hour  Intake   2150 ml  Output   2200 ml  Net    -50 ml   [employer]  08/02/14 0353 08/03/14 0400  Weight: 93.895 kg (207 lb) 98.3 kg (216 lb 11.4 oz)    Exam:   General:  NAD. Dry mucous membranes.  Cardiovascular: RRR  Respiratory: CTAB anterior lung fields  Abdomen: Soft, nontender, nondistended, positive bowel sounds.  Musculoskeletal: No clubbing cyanosis or edema.  Data Reviewed: Basic Metabolic Panel:  Recent Labs Lab 08/01/14 1216 08/02/14 0431 08/03/14 0400 08/04/14 0405  NA 143 142 145 144  K 3.8 4.2 3.7 3.1*  CL  --  106 111 110  CO2 28 24 24 27   GLUCOSE 156* 153* 120* 104*  BUN 24.5 33* 28* 17  CREATININE 1.6* 1.42* 1.08 1.09  CALCIUM 9.0 8.3* 7.9* 7.9*   Liver Function Tests:  Recent Labs Lab 08/01/14 1216 08/02/14 0431  AST 18 14  ALT 12 8  ALKPHOS 84 60  BILITOT 0.58 0.3  PROT 6.0* 4.8*  ALBUMIN 3.6 2.6*   No results for input(s): LIPASE, AMYLASE in the last 168 hours. No results for input(s): AMMONIA in the last 168 hours. CBC:  Recent Labs Lab 08/01/14 1215 08/02/14 0431  08/02/14 1600 08/03/14 0028 08/03/14 0910 08/03/14 1600 08/05/14 0425  WBC 6.9 8.8  < > 7.8 8.2 7.7 7.7 7.3  NEUTROABS 3.8 5.2  --   --   --   --   --   --   HGB 11.8* 8.4*  < > 9.2* 10.0* 9.8* 9.7* 9.5*  HCT 36.3* 25.1*  < > 27.5* 29.9* 29.1* 29.7* 27.8*  MCV 101.1* 101.6*  < > 92.3 89.5 89.8 91.4 91.4  PLT 196 166  < > 162 146* 137* 143* 131*  < > = values in this interval not displayed. Cardiac Enzymes:  Recent Labs Lab 08/02/14 0542  TROPONINI <0.30   BNP (last 3 results)  Recent Labs  12/09/13 0130  PROBNP 630.1*   CBG:  Recent Labs Lab 08/03/14 2331 08/04/14 0548 08/04/14 1139 08/04/14 1637 08/05/14 1149  GLUCAP 97 83 88 86 137*    Recent Results (from the past 240 hour(s))  MRSA PCR Screening      Status: None   Collection Time: 08/02/14  6:17 AM  Result Value Ref Range Status   MRSA by PCR NEGATIVE NEGATIVE Final    Comment:        The GeneXpert MRSA Assay (FDA approved for NASAL specimens only), is one component of a comprehensive MRSA colonization surveillance program. It is not intended to diagnose MRSA infection nor to guide or monitor treatment for MRSA infections.      Studies: No results found.  Scheduled Meds: . sodium chloride   Intravenous Once  . sodium chloride   Intravenous Once  . antiseptic oral rinse  7 mL Mouth Rinse BID  . lidocaine      . pantoprazole (PROTONIX) IV  40 mg Intravenous Q12H   Continuous Infusions: . sodium chloride 75 mL/hr at 08/05/14 1618    Principal Problem:   Hemorrhagic shock Active Problems:   Acute upper GI bleed   Malignant gastric ulcer: proximal per EGD 08/02/14   Mantle cell lymphoma of intra-abdominal lymph nodes   Essential hypertension   Acute blood loss anemia   Chronic kidney disease (CKD), stage II (mild)   Acute GI bleeding   Bleeding gastrointestinal   Acute on chronic respiratory failure   Lymphoma   Syncope   Bradycardia   Acute encephalopathy    Time spent: 45 mins    [facility] MD [facility] Pager [phone]. If  7PM-7AM, please contact night-coverage at [email or web address], password [facility] 08/05/2014, 10:23 PM  LOS: 3 days

## 2014-08-05 NOTE — Sedation Documentation (Addendum)
O2 increased from 2 to 4LPM via [place].

## 2014-08-05 NOTE — Sedation Documentation (Signed)
5Fr sheath removed from R Femoral artery by Dr. Vernard Gambles.  Hemostasis achieved using Exoseal device.  R groin level 0.

## 2014-08-05 NOTE — Sedation Documentation (Signed)
Family updated as to patient's status.

## 2014-08-06 LAB — BASIC METABOLIC PANEL
ANION GAP: 14 (ref 5–15)
ANION GAP: 12 (ref 5–15)
Anion gap: 11 (ref 5–15)
BUN: 9 mg/dL (ref 6–23)
BUN: 10 mg/dL (ref 6–23)
BUN: 11 mg/dL (ref 6–23)
CALCIUM: 8.1 mg/dL — AB (ref 8.4–10.5)
CALCIUM: 8.2 mg/dL — AB (ref 8.4–10.5)
CO2: 27 mEq/L (ref 19–32)
CO2: 28 mEq/L (ref 19–32)
CO2: 25 mEq/L (ref 19–32)
Calcium: 7.9 mg/dL — ABNORMAL LOW (ref 8.4–10.5)
Chloride: 101 mEq/L (ref 96–112)
Chloride: 103 mEq/L (ref 96–112)
Chloride: 101 mEq/L (ref 96–112)
Creatinine, Ser: 0.93 mg/dL (ref 0.50–1.35)
Creatinine, Ser: 0.99 mg/dL (ref 0.50–1.35)
Creatinine, Ser: 0.98 mg/dL (ref 0.50–1.35)
GFR calc Af Amer: 88 mL/min — ABNORMAL LOW (ref >90)
GFR calc non Af Amer: 78 mL/min — ABNORMAL LOW (ref >90)
GFR calc non Af Amer: 76 mL/min — ABNORMAL LOW (ref >90)
GFR, EST AFRICAN AMERICAN: 88 mL/min — AB (ref >90)
GFR, EST NON AFRICAN AMERICAN: 76 mL/min — AB (ref >90)
Glucose, Bld: 115 mg/dL — ABNORMAL HIGH (ref 70–99)
Glucose, Bld: 120 mg/dL — ABNORMAL HIGH (ref 70–99)
Glucose, Bld: 253 mg/dL — ABNORMAL HIGH (ref 70–99)
POTASSIUM: 3.2 meq/L — AB (ref 3.7–5.3)
Potassium: 3 mEq/L — ABNORMAL LOW (ref 3.7–5.3)
Potassium: 4.1 mEq/L (ref 3.7–5.3)
Sodium: 142 mEq/L (ref 137–147)
Sodium: 142 mEq/L (ref 137–147)
Sodium: 138 mEq/L (ref 137–147)

## 2014-08-06 LAB — TYPE AND SCREEN
ABO/RH(D): A POS
Antibody Screen: NEGATIVE
UNIT DIVISION: 0
UNIT DIVISION: 0
Unit division: 0
Unit division: 0
Unit division: 0
Unit division: 0

## 2014-08-06 LAB — CBC
HEMATOCRIT: 30.8 % — AB (ref 39.0–52.0)
Hemoglobin: 9.9 g/dL — ABNORMAL LOW (ref 13.0–17.0)
MCH: 29.6 pg (ref 26.0–34.0)
MCHC: 32.1 g/dL (ref 30.0–36.0)
MCV: 92.2 fL (ref 78.0–100.0)
Platelets: 147 10*3/uL — ABNORMAL LOW (ref 150–400)
RBC: 3.34 MIL/uL — ABNORMAL LOW (ref 4.22–5.81)
RDW: 17.2 % — ABNORMAL HIGH (ref 11.5–15.5)
WBC: 11.2 10*3/uL — ABNORMAL HIGH (ref 4.0–10.5)

## 2014-08-06 LAB — GLUCOSE, CAPILLARY
GLUCOSE-CAPILLARY: 101 mg/dL — AB (ref 70–99)
Glucose-Capillary: 120 mg/dL — ABNORMAL HIGH (ref 70–99)
Glucose-Capillary: 124 mg/dL — ABNORMAL HIGH (ref 70–99)
Glucose-Capillary: 148 mg/dL — ABNORMAL HIGH (ref 70–99)

## 2014-08-06 LAB — MAGNESIUM: Magnesium: 1.7 mg/dL (ref 1.5–2.5)

## 2014-08-06 MED ORDER — HYDROCODONE-ACETAMINOPHEN 5-325 MG PO TABS: 1.0000 | ORAL_TABLET | ORAL | Status: DC | PRN

## 2014-08-06 MED ORDER — POTASSIUM CHLORIDE CRYS ER 20 MEQ PO TBCR
40.0000 meq | EXTENDED_RELEASE_TABLET | Freq: Once | ORAL | Status: AC
  Administered 2014-08-06: 40 meq via ORAL
  Filled 2014-08-06: qty 2

## 2014-08-06 MED ORDER — POTASSIUM CHLORIDE CRYS ER 20 MEQ PO TBCR
40.0000 meq | EXTENDED_RELEASE_TABLET | ORAL | Status: AC
  Administered 2014-08-06 (×2): 40 meq via ORAL
  Filled 2014-08-06 (×2): qty 2

## 2014-08-06 MED ORDER — DEXTROSE 5 % IV SOLN
3.0000 g | Freq: Once | INTRAVENOUS | Status: AC
  Administered 2014-08-06: 3 g via INTRAVENOUS
  Filled 2014-08-06: qty 6

## 2014-08-06 MED ORDER — HYDRALAZINE HCL 20 MG/ML IJ SOLN: 10.0000 mg | Freq: Four times a day (QID) | INTRAMUSCULAR | Status: DC | PRN

## 2014-08-06 NOTE — Progress Notes (Signed)
Attempted to call report. RN currently in isolation room; will call back when out.

## 2014-08-06 NOTE — Progress Notes (Addendum)
Patient ID: Martin Morris, male   DOB: July 09, 1933, 80 y.o.   MRN: [id number] [facility] PROGRESS NOTE  COTY LARSH INO:[id number] DOB: Jun 29, 1933 DOA: 08/02/2014 PCP: [name], MD  Brief narrative:    80 year old male with past medical history of mantle cell lymphoma (diagnosed in 10/2006)undergoing active chemotherapy (under Dr. Calton Dach care), most recently has had PET scan 07/27/14 which showed diffuse progression of the disease most obvious in fundus of stomach, as well as descending colon and cecum. CT head 08/02/2014 showed possible early intracranial metastases.  Patient presented to [facility] 08/02/2014 with acute GI bleed. Patient was evaluated by GI. He was intubated for EGD which revealed malignant gastric ulcer status post endoclipping and resolution of bleeding. Please note patient received PPI gtt and blood transfusion as well. PCCM consulted for syncope and bradycardia following hematemesis. Additionally, IR consulted for elective embolization. Patinet is status post mesenteric angiogram with coil embolization of the right gastroepiploic artery 08/05/2014.   Assessment/Plan:    Principal Problem: Acute hemorrhagic shock / upper GI bleed / acute blood loss anemia  - Secondary to upper GI bleed secondary to malignant proximal gastric ulcer as seen on EGD 08/02/2014  - Patient is status post Endo Clip 3. Also, status post mesenteric arteriogram with coil embolization of the right gastroepiploic artery done by IR  08/05/2014. - Patient status post 4 unit packed red blood cells and 1 unit platelets(08/02/14) transfusion.  - Plavix and Asprin are on hold - Continue protonix 40 mg IV Q 12 hours  - Hemoglobin remains stable at 9.9 - Patient is hemodynamically stable and will be transferred to telemetry unit today - Diet advanced to clear liquid today.   Active Problems: Malignant proximal gastric ulcer - Status post Endo Clip 3 08/02/2014. Status post mesenteric  arteriogram with coil embolization right gastroepiploic artery per interventional radiology 08/05/2014.  - Radiation oncology has been consulted and saw patient 08/03/2014 for possible radiation therapy. Rad oncology to re-eval after completion of embolization procedure if patient is a candidate for palliative radiotherapy.   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 He receives Rituxan every alternate cycle. - Last chemotherapy was given on 07/11/14, cycle #81 - CT of the head shows possible early metastatic disease. - It will be necessary to review the code status with the patient and family.  Thrombocytopenia, mild - Due to acute bleed, malignancy  - Patient received 1 unit of platelets on 12/8 due to bleeding  Chronic kidney disease (CKD), stage II (mild) - This is chronic in nature, unlikely related to his cancer.  - Baseline is 1.4. Currently, creatinine is WNL.  Headaches with abnormal head imaging - will need MRI once more stable to exclude malignancy.  Acute respiratory failure with hypoxia - Patient was intubated for EGD. Patient has been extubated and his respiratory status remains stable.   Symptomatic bradycardia in the setting of syncopal event - Likely secondary to hemorrhagic shock. Antihypertensives on hold. Aspirin and Plavix on hold.  - Resolved.  Hypokalemia - Likely GI losses - Being supplemented   Acute encephalopathy - Likely secondary to syncopal event secondary to hemorrhagic shock.??? Early brain metastases.   Essential hypertension - has had hypotension so antihypertensives were on hold. This am BP is 146/80. Will give as needed hydralazine    DVT Prophylaxis  - aspirin and plavix on hold due to risk of bleeding  - SCD's bilaterally  Code Status: Full.  Family Communication:  Family not at the bedside this am Disposition Plan: transfer to telemetry unit  today.   IV access:   PAC  Procedures and diagnostic studies:     Chest x-ray 08/02/2014  CT head 08/02/2014  Upper endoscopy 08/02/2014-Dr. Henrene Pastor  IR mesenteric arteriogram, coil embolization right gastroepiploic artery per interventional radiology Dr. Vernard Gambles 08/05/2014  Intubation 08/02/2014>>> extubation 08/03/2014  Medical Consultants:   PCCM Dr. Elsworth Soho 08/02/2014  Gastroenterology: Dr. Henrene Pastor 08/02/2014  Oncology: Dr. Alvy Bimler 08/03/2014  Interventional radiology: Dr. Anselm Pancoast 08/04/2014  Radiation/ oncology: Dr. Tammi Klippel 08/03/2014 Other Consultants:   Physical therapy  IAnti-Infectives:    None    Leisa Lenz, MD  [facility] Pager [phone]  If 7PM-7AM, please contact night-coverage [email or web address] Password [facility] 08/06/2014, 10:47 AM   LOS: 4 days    HPI/Subjective: No acute overnight events.  Objective: Filed Vitals:   08/06/14 0500 08/06/14 0600 08/06/14 0800 08/06/14 1000  BP: 143/85 138/75 131/76 146/80  Pulse: 92 89 93 95  Temp: 100.2 F (37.9 C) 100.2 F (37.9 C) 100 F (37.8 C) 100.6 F (38.1 C)  TempSrc:      Resp: 13 17 20 21   Height:      Weight:      SpO2: 97% 95% 96% 97%    Intake/Output Summary (Last 24 hours) at 08/06/14 1047 Last data filed at 08/06/14 1000  Gross per 24 hour  Intake   1850 ml  Output   2475 ml  Net   -625 ml    Exam:   General:  Pt is alert, follows commands appropriately, not in acute distress  Cardiovascular: Regular rate and rhythm, S1/S2, no murmurs  Respiratory: Clear to auscultation bilaterally, no wheezing, no crackles, no rhonchi  Abdomen: Soft, non tender, non distended, bowel sounds present  Extremities: No edema, pulses DP and PT palpable bilaterally  Neuro: Grossly nonfocal  Data Reviewed: Basic Metabolic Panel:  Recent Labs Lab 08/02/14 0431 08/03/14 0400 08/04/14 0405 08/05/14 2308 08/06/14 0400  NA 142 145 144 142 142  K 4.2 3.7 3.1* 3.0* 3.2*  CL 106 111 110 101  103  CO2 24 24 27 27 28   GLUCOSE 153* 120* 104* 115* 120*  BUN 33* 28* 17 9 10   CREATININE 1.42* 1.08 1.09 0.93 0.99  CALCIUM 8.3* 7.9* 7.9* 8.1* 8.2*  MG  --   --   --   --  1.7   Liver Function Tests:  Recent Labs Lab 08/01/14 1216 08/02/14 0431  AST 18 14  ALT 12 8  ALKPHOS 84 60  BILITOT 0.58 0.3  PROT 6.0* 4.8*  ALBUMIN 3.6 2.6*   No results for input(s): LIPASE, AMYLASE in the last 168 hours. No results for input(s): AMMONIA in the last 168 hours. CBC:  Recent Labs Lab 08/01/14 1215 08/02/14 0431  08/03/14 0910 08/03/14 1600 08/05/14 0425 08/05/14 2308 08/06/14 0400  WBC 6.9 8.8  < > 7.7 7.7 7.3 9.6 11.2*  NEUTROABS 3.8 5.2  --   --   --   --   --   --   HGB 11.8* 8.4*  < > 9.8* 9.7* 9.5* 9.9* 9.9*  HCT 36.3* 25.1*  < > 29.1* 29.7* 27.8* 29.7* 30.8*  MCV 101.1* 101.6*  < > 89.8 91.4 91.4 91.7 92.2  PLT 196 166  < > 137* 143* 131* 150 147*  < > = values in this interval not displayed. Cardiac Enzymes:  Recent Labs Lab 08/02/14 0542  TROPONINI <  0.30   BNP: Invalid input(s): POCBNP CBG:  Recent Labs Lab 08/04/14 0548 08/04/14 1139 08/04/14 1637 08/05/14 1149 08/06/14 0013  GLUCAP 83 88 86 137* 120*    Recent Results (from the past 240 hour(s))  MRSA PCR Screening     Status: None   Collection Time: 08/02/14  6:17 AM  Result Value Ref Range Status   MRSA by PCR NEGATIVE NEGATIVE Final     Scheduled Meds: . antiseptic oral rinse  7 mL Mouth Rinse BID  . pantoprazole (PROTONIX) IV  40 mg Intravenous Q12H   Continuous Infusions: . sodium chloride 75 mL/hr at 08/05/14 1618

## 2014-08-06 NOTE — Progress Notes (Signed)
Referring Physician(s): TRH  Subjective:  Mesenteric arteriogram with embolization of gastroepiploic artery 08/05/14 Pt up in chair; eating liq diet Feels fine  Allergies: Atorvastatin  Medications: Prior to Admission medications   Medication Sig Start Date End Date Taking? Authorizing Provider  acyclovir (ZOVIRAX) 400 MG tablet TAKE ONE TABLET BY MOUTH TWICE DAILY Patient taking differently: Take 400 mg by mouth daily. For 6 months then stop 06/20/14  Yes Aasim Marla Roe, MD  amLODipine (NORVASC) 5 MG tablet Take 1 tablet (5 mg total) by mouth daily. 01/12/14  Yes Minus Breeding, MD  aspirin 81 MG tablet Take 81 mg by mouth daily.    Yes Historical Provider, MD  atenolol (TENORMIN) 50 MG tablet Take 1 tablet (50 mg total) by mouth daily. 01/12/14  Yes Minus Breeding, MD  citalopram (CELEXA) 20 MG tablet TAKE ONE TABLET BY MOUTH ONCE DAILY Patient taking differently: Take 20 mg by mouth daily.  06/20/14  Yes Aasim Marla Roe, MD  clopidogrel (PLAVIX) 75 MG tablet Take 1 tablet (75 mg total) by mouth daily. 01/12/14  Yes Minus Breeding, MD  furosemide (LASIX) 20 MG tablet Take one daily as needed for fluid Patient taking differently: Take 20 mg by mouth as needed for fluid.  04/15/14  Yes Minus Breeding, MD  HYDROcodone-acetaminophen (NORCO/VICODIN) 5-325 MG per tablet Take 1-2 tablets by mouth every 6 (six) hours as needed for moderate pain. 12/10/13  Yes Lucille Passy Babish, PA-C  isosorbide mononitrate (IMDUR) 120 MG 24 hr tablet Take 1 tablet (120 mg total) by mouth daily. 01/12/14  Yes Minus Breeding, MD  lactose free nutrition (BOOST PLUS) LIQD Take 237 mLs by mouth 2 (two) times daily as needed (Allow w/pt or pt's wife request after diet advancement). 12/13/13  Yes Annita Brod, MD  lidocaine-prilocaine (EMLA) cream Apply 1 application topically See admin instructions. Apply to port 1 hour before treatment. Cover site with plastic wrap 03/24/14  Yes Concha Norway, MD    methocarbamol (ROBAXIN) 500 MG tablet Take 1 tablet (500 mg total) by mouth every 6 (six) hours as needed for muscle spasms. 12/10/13  Yes Lucille Passy Babish, PA-C  nitroGLYCERIN (NITROSTAT) 0.4 MG SL tablet Place 1 tablet (0.4 mg total) under the tongue every 5 (five) minutes as needed for chest pain. 01/12/14  Yes Minus Breeding, MD  pravastatin (PRAVACHOL) 80 MG tablet Take 1 tablet (80 mg total) by mouth daily. 01/12/14  Yes Minus Breeding, MD  PRESCRIPTION MEDICATION Inject into the vein every 21 ( twenty-one) days. Velcade and Cytoxan.    Historical Provider, MD  sulfamethoxazole-trimethoprim (BACTRIM DS,SEPTRA DS) 800-160 MG per tablet Take 1 tablet by mouth every Monday, Wednesday, and Friday. Patient not taking: Reported on 08/02/2014 07/20/14   Aasim Marla Roe, MD    Review of Systems  Vital Signs: BP 146/80 mmHg  Pulse 95  Temp(Src) 100.6 F (38.1 C) (Core (Comment))  Resp 21  Ht 5' 8"  (1.727 m)  Wt 98.3 kg (216 lb 11.4 oz)  BMI 32.96 kg/m2  SpO2 97%  Physical Exam  Abdominal:  Rt groin NT; no bleeding; no hematoma Rt foot 1+pulses  Low grade temp vss     Imaging: Ir Angiogram Visceral Selective  08/06/2014   INDICATION: Mantle cell lymphoma, with acute GI bleed secondary to gastric involvement, currently controlled with endoscopic clipping. The lesion is along the greater curvature. Prophylactic arterial embolization is requested.  EXAM: 1. ULTRAOUND GUIDANCE FOR ARTERIAL ACCESS 2. CELIAC AND SUPERIOR MESENTERIC ARTERIOGRAM (1st ORDER) 3.  LEFT PHRENIC ARTERIOGRAM (1ST ORDER) 4. GASTRODOUDENAL ARTERIOGRAM (3rd ORDER) 5. PERCUTANEOUS COIL EMBOLIZATION RIGHT GASTROEPIPLOIC ARTERY  COMPARISON:  PET-CT 07/27/2014 and earlier studies  MEDICATIONS: Fentanyl 75 mcg IV; Versed 2.0 mg IV  CONTRAST:  136m OMNIPAQUE IOHEXOL 300 MG/ML  SOLN  ANESTHESIA/SEDATION: Total Moderate Sedation Time  89 minutes  FLUOROSCOPY TIME:  29 minutes.  48 seconds.  ACCESS: Right common femoral  artery; hemostasis achieved with Exoseal.  COMPLICATIONS: None immediate  TECHNIQUE: Informed written consent was obtained from the patient after a discussion of the risks, benefits and alternatives to treatment. Questions regarding the procedure were encouraged and answered. A timeout was performed prior to the initiation of the procedure.  The right groin was prepped and drapped in the usual sterile fashion, and a sterile drape was applied covering the operative field. Maximum barrier sterile technique with sterile gowns and gloves were used for the procedure. A timeout was performed prior to the initiation of the procedure. Local anesthesia was provided with 1% lidocaine.  Under ultrasound guidance, the right common femoral artery was accessed with a micropuncture kit after the overlying soft tissues were anesthetized with 1% lidocaine. An ultrasound image was saved for documentation purposes. The micropuncture sheath was exchanged for a 5 [place] sheath over a Bentson wire. At the conclusion of the case, closure arteriogram was performed through the side of the sheath confirming access within the right common femoral artery.  Over a Bentson wire, a C2 catheter was advanced and utilized to select the superior mesenteric artery and a superior mesenteric arteriogram was performed. The C2 catheter was then advanced cranially and utilized to select the celiac artery and a celiac arteriogram was performed.  The C2 was exchanged for a Sos catheter. The the left phrenic artery with was selectively catheterized for arteriography. This demonstrated no significant perfusion to the greater curvature of the stomach.  The Sos was exchanged for a Mickelson. With an angled roadrunner wire, this was advanced into the common hepatic artery. A renegade micro catheter was cul axially advanced into the gastroduodenal artery with the aid of the trans and guidewire. Gastroduodenal arteriography was performed. The micro  catheter was exchanged for a longer device, which allowed the distal catheterization of the right gastroepiploic artery at the level of the endoscopic clips. Coil embolization above and below the level of the endoscopic clips was performed using 2 and 3 mm interlock coils. Final arteriogram through the micro catheter in the right gastroepiploic artery was performed. The micro catheter was removed. Follow-up celiac arteriogram was performed.  At this point, the procedure was terminated. All wires and catheters and sheaths were removed from the patient. Hemostasis was achieved at the right groin and access site . A dressing was placed. The patient tolerated procedure well without immediate postprocedural complication.  FINDINGS: Superior mesenteric arteriogram demonstrates replaced right hepatic arterial artery, an anatomic variant. No significant gastric arterial supply.  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 A large right gastroepiploic artery from the GDA supplies the greater curvature of the stomach to the level of the endoscopic clips.  Left phrenic arteriogram shows no significant gastric arterial supply. If  Percutaneous coil embolization of the distal right gastroepiploic artery along the greater curvature of the stomach at the level of the endoscopic clips was performed.  Completion arteriogram shows technically successful occlusion of the distal right gastroepiploic artery inferior to the level of the endoscopic clips, with the vessel patent more proximally  at the level of the gastric antrum. Follow-up celiac arteriogram shows stable arterial anatomy without evident complication.  IMPRESSION: 1. Technically successful coil embolization of the distal right gastroepiploic artery at the level of the endoscopic clips along the greater curvature of the stomach, site of known bleeding lesion.   Electronically Signed   By: Arne Cleveland  M.D.   On: 08/06/2014 09:17   Ir Angiogram Visceral Selective  08/06/2014   INDICATION: Mantle cell lymphoma, with acute GI bleed secondary to gastric involvement, currently controlled with endoscopic clipping. The lesion is along the greater curvature. Prophylactic arterial embolization is requested.  EXAM: 1. ULTRAOUND GUIDANCE FOR ARTERIAL ACCESS 2. CELIAC AND SUPERIOR MESENTERIC ARTERIOGRAM (1st ORDER) 3. LEFT PHRENIC ARTERIOGRAM (1ST ORDER) 4. GASTRODOUDENAL ARTERIOGRAM (3rd ORDER) 5. PERCUTANEOUS COIL EMBOLIZATION RIGHT GASTROEPIPLOIC ARTERY  COMPARISON:  PET-CT 07/27/2014 and earlier studies  MEDICATIONS: Fentanyl 75 mcg IV; Versed 2.0 mg IV  CONTRAST:  120m OMNIPAQUE IOHEXOL 300 MG/ML  SOLN  ANESTHESIA/SEDATION: Total Moderate Sedation Time  89 minutes  FLUOROSCOPY TIME:  29 minutes.  48 seconds.  ACCESS: Right common femoral artery; hemostasis achieved with Exoseal.  COMPLICATIONS: None immediate  TECHNIQUE: Informed written consent was obtained from the patient after a discussion of the risks, benefits and alternatives to treatment. Questions regarding the procedure were encouraged and answered. A timeout was performed prior to the initiation of the procedure.  The right groin was prepped and drapped in the usual sterile fashion, and a sterile drape was applied covering the operative field. Maximum barrier sterile technique with sterile gowns and gloves were used for the procedure. A timeout was performed prior to the initiation of the procedure. Local anesthesia was provided with 1% lidocaine.  Under ultrasound guidance, the right common femoral artery was accessed with a micropuncture kit after the overlying soft tissues were anesthetized with 1% lidocaine. An ultrasound image was saved for documentation purposes. The micropuncture sheath was exchanged for a 5 [place] sheath over a Bentson wire. At the conclusion of the case, closure arteriogram was performed through the side of the sheath  confirming access within the right common femoral artery.  Over a Bentson wire, a C2 catheter was advanced and utilized to select the superior mesenteric artery and a superior mesenteric arteriogram was performed. The C2 catheter was then advanced cranially and utilized to select the celiac artery and a celiac arteriogram was performed.  The C2 was exchanged for a Sos catheter. The the left phrenic artery with was selectively catheterized for arteriography. This demonstrated no significant perfusion to the greater curvature of the stomach.  The Sos was exchanged for a Mickelson. With an angled roadrunner wire, this was advanced into the common hepatic artery. A renegade micro catheter was cul axially advanced into the gastroduodenal artery with the aid of the trans and guidewire. Gastroduodenal arteriography was performed. The micro catheter was exchanged for a longer device, which allowed the distal catheterization of the right gastroepiploic artery at the level of the endoscopic clips. Coil embolization above and below the level of the endoscopic clips was performed using 2 and 3 mm interlock coils. Final arteriogram through the micro catheter in the right gastroepiploic artery was performed. The micro catheter was removed. Follow-up celiac arteriogram was performed.  At this point, the procedure was terminated. All wires and catheters and sheaths were removed from the patient. Hemostasis was achieved at the right groin and access site . A dressing was placed. The patient tolerated procedure well without immediate postprocedural  complication.  FINDINGS: Superior mesenteric arteriogram demonstrates replaced right hepatic arterial artery, an anatomic variant. No significant gastric arterial supply.  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 A large right gastroepiploic artery from the GDA supplies the greater curvature of the stomach to the level  of the endoscopic clips.  Left phrenic arteriogram shows no significant gastric arterial supply. If  Percutaneous coil embolization of the distal right gastroepiploic artery along the greater curvature of the stomach at the level of the endoscopic clips was performed.  Completion arteriogram shows technically successful occlusion of the distal right gastroepiploic artery inferior to the level of the endoscopic clips, with the vessel patent more proximally at the level of the gastric antrum. Follow-up celiac arteriogram shows stable arterial anatomy without evident complication.  IMPRESSION: 1. Technically successful coil embolization of the distal right gastroepiploic artery at the level of the endoscopic clips along the greater curvature of the stomach, site of known bleeding lesion.   Electronically Signed   By: Arne Cleveland M.D.   On: 08/06/2014 09:17   Ir Angiogram Visceral Selective  08/06/2014   INDICATION: Mantle cell lymphoma, with acute GI bleed secondary to gastric involvement, currently controlled with endoscopic clipping. The lesion is along the greater curvature. Prophylactic arterial embolization is requested.  EXAM: 1. ULTRAOUND GUIDANCE FOR ARTERIAL ACCESS 2. CELIAC AND SUPERIOR MESENTERIC ARTERIOGRAM (1st ORDER) 3. LEFT PHRENIC ARTERIOGRAM (1ST ORDER) 4. GASTRODOUDENAL ARTERIOGRAM (3rd ORDER) 5. PERCUTANEOUS COIL EMBOLIZATION RIGHT GASTROEPIPLOIC ARTERY  COMPARISON:  PET-CT 07/27/2014 and earlier studies  MEDICATIONS: Fentanyl 75 mcg IV; Versed 2.0 mg IV  CONTRAST:  139m OMNIPAQUE IOHEXOL 300 MG/ML  SOLN  ANESTHESIA/SEDATION: Total Moderate Sedation Time  89 minutes  FLUOROSCOPY TIME:  29 minutes.  48 seconds.  ACCESS: Right common femoral artery; hemostasis achieved with Exoseal.  COMPLICATIONS: None immediate  TECHNIQUE: Informed written consent was obtained from the patient after a discussion of the risks, benefits and alternatives to treatment. Questions regarding the procedure were  encouraged and answered. A timeout was performed prior to the initiation of the procedure.  The right groin was prepped and drapped in the usual sterile fashion, and a sterile drape was applied covering the operative field. Maximum barrier sterile technique with sterile gowns and gloves were used for the procedure. A timeout was performed prior to the initiation of the procedure. Local anesthesia was provided with 1% lidocaine.  Under ultrasound guidance, the right common femoral artery was accessed with a micropuncture kit after the overlying soft tissues were anesthetized with 1% lidocaine. An ultrasound image was saved for documentation purposes. The micropuncture sheath was exchanged for a 5 [place] sheath over a Bentson wire. At the conclusion of the case, closure arteriogram was performed through the side of the sheath confirming access within the right common femoral artery.  Over a Bentson wire, a C2 catheter was advanced and utilized to select the superior mesenteric artery and a superior mesenteric arteriogram was performed. The C2 catheter was then advanced cranially and utilized to select the celiac artery and a celiac arteriogram was performed.  The C2 was exchanged for a Sos catheter. The the left phrenic artery with was selectively catheterized for arteriography. This demonstrated no significant perfusion to the greater curvature of the stomach.  The Sos was exchanged for a Mickelson. With an angled roadrunner wire, this was advanced into the common hepatic artery. A renegade micro catheter was cul axially advanced into the gastroduodenal artery with the  aid of the trans and guidewire. Gastroduodenal arteriography was performed. The micro catheter was exchanged for a longer device, which allowed the distal catheterization of the right gastroepiploic artery at the level of the endoscopic clips. Coil embolization above and below the level of the endoscopic clips was performed using 2 and 3 mm  interlock coils. Final arteriogram through the micro catheter in the right gastroepiploic artery was performed. The micro catheter was removed. Follow-up celiac arteriogram was performed.  At this point, the procedure was terminated. All wires and catheters and sheaths were removed from the patient. Hemostasis was achieved at the right groin and access site . A dressing was placed. The patient tolerated procedure well without immediate postprocedural complication.  FINDINGS: Superior mesenteric arteriogram demonstrates replaced right hepatic arterial artery, an anatomic variant. No significant gastric arterial supply.  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 A large right gastroepiploic artery from the GDA supplies the greater curvature of the stomach to the level of the endoscopic clips.  Left phrenic arteriogram shows no significant gastric arterial supply. If  Percutaneous coil embolization of the distal right gastroepiploic artery along the greater curvature of the stomach at the level of the endoscopic clips was performed.  Completion arteriogram shows technically successful occlusion of the distal right gastroepiploic artery inferior to the level of the endoscopic clips, with the vessel patent more proximally at the level of the gastric antrum. Follow-up celiac arteriogram shows stable arterial anatomy without evident complication.  IMPRESSION: 1. Technically successful coil embolization of the distal right gastroepiploic artery at the level of the endoscopic clips along the greater curvature of the stomach, site of known bleeding lesion.   Electronically Signed   By: Arne Cleveland M.D.   On: 08/06/2014 09:17   Ir Angiogram Selective Each Additional Vessel  08/06/2014   INDICATION: Mantle cell lymphoma, with acute GI bleed secondary to gastric involvement, currently controlled with endoscopic clipping. The lesion is along the greater  curvature. Prophylactic arterial embolization is requested.  EXAM: 1. ULTRAOUND GUIDANCE FOR ARTERIAL ACCESS 2. CELIAC AND SUPERIOR MESENTERIC ARTERIOGRAM (1st ORDER) 3. LEFT PHRENIC ARTERIOGRAM (1ST ORDER) 4. GASTRODOUDENAL ARTERIOGRAM (3rd ORDER) 5. PERCUTANEOUS COIL EMBOLIZATION RIGHT GASTROEPIPLOIC ARTERY  COMPARISON:  PET-CT 07/27/2014 and earlier studies  MEDICATIONS: Fentanyl 75 mcg IV; Versed 2.0 mg IV  CONTRAST:  137m OMNIPAQUE IOHEXOL 300 MG/ML  SOLN  ANESTHESIA/SEDATION: Total Moderate Sedation Time  89 minutes  FLUOROSCOPY TIME:  29 minutes.  48 seconds.  ACCESS: Right common femoral artery; hemostasis achieved with Exoseal.  COMPLICATIONS: None immediate  TECHNIQUE: Informed written consent was obtained from the patient after a discussion of the risks, benefits and alternatives to treatment. Questions regarding the procedure were encouraged and answered. A timeout was performed prior to the initiation of the procedure.  The right groin was prepped and drapped in the usual sterile fashion, and a sterile drape was applied covering the operative field. Maximum barrier sterile technique with sterile gowns and gloves were used for the procedure. A timeout was performed prior to the initiation of the procedure. Local anesthesia was provided with 1% lidocaine.  Under ultrasound guidance, the right common femoral artery was accessed with a micropuncture kit after the overlying soft tissues were anesthetized with 1% lidocaine. An ultrasound image was saved for documentation purposes. The micropuncture sheath was exchanged for a 5 [place] sheath over a Bentson wire. At the conclusion of the case, closure arteriogram was performed through the side  of the sheath confirming access within the right common femoral artery.  Over a Bentson wire, a C2 catheter was advanced and utilized to select the superior mesenteric artery and a superior mesenteric arteriogram was performed. The C2 catheter was then advanced  cranially and utilized to select the celiac artery and a celiac arteriogram was performed.  The C2 was exchanged for a Sos catheter. The the left phrenic artery with was selectively catheterized for arteriography. This demonstrated no significant perfusion to the greater curvature of the stomach.  The Sos was exchanged for a Mickelson. With an angled roadrunner wire, this was advanced into the common hepatic artery. A renegade micro catheter was cul axially advanced into the gastroduodenal artery with the aid of the trans and guidewire. Gastroduodenal arteriography was performed. The micro catheter was exchanged for a longer device, which allowed the distal catheterization of the right gastroepiploic artery at the level of the endoscopic clips. Coil embolization above and below the level of the endoscopic clips was performed using 2 and 3 mm interlock coils. Final arteriogram through the micro catheter in the right gastroepiploic artery was performed. The micro catheter was removed. Follow-up celiac arteriogram was performed.  At this point, the procedure was terminated. All wires and catheters and sheaths were removed from the patient. Hemostasis was achieved at the right groin and access site . A dressing was placed. The patient tolerated procedure well without immediate postprocedural complication.  FINDINGS: Superior mesenteric arteriogram demonstrates replaced right hepatic arterial artery, an anatomic variant. No significant gastric arterial supply.  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 A large right gastroepiploic artery from the GDA supplies the greater curvature of the stomach to the level of the endoscopic clips.  Left phrenic arteriogram shows no significant gastric arterial supply. If  Percutaneous coil embolization of the distal right gastroepiploic artery along the greater curvature of the stomach at the level of the endoscopic  clips was performed.  Completion arteriogram shows technically successful occlusion of the distal right gastroepiploic artery inferior to the level of the endoscopic clips, with the vessel patent more proximally at the level of the gastric antrum. Follow-up celiac arteriogram shows stable arterial anatomy without evident complication.  IMPRESSION: 1. Technically successful coil embolization of the distal right gastroepiploic artery at the level of the endoscopic clips along the greater curvature of the stomach, site of known bleeding lesion.   Electronically Signed   By: Arne Cleveland M.D.   On: 08/06/2014 09:17   Ir Angiogram Selective Each Additional Vessel  08/06/2014   INDICATION: Mantle cell lymphoma, with acute GI bleed secondary to gastric involvement, currently controlled with endoscopic clipping. The lesion is along the greater curvature. Prophylactic arterial embolization is requested.  EXAM: 1. ULTRAOUND GUIDANCE FOR ARTERIAL ACCESS 2. CELIAC AND SUPERIOR MESENTERIC ARTERIOGRAM (1st ORDER) 3. LEFT PHRENIC ARTERIOGRAM (1ST ORDER) 4. GASTRODOUDENAL ARTERIOGRAM (3rd ORDER) 5. PERCUTANEOUS COIL EMBOLIZATION RIGHT GASTROEPIPLOIC ARTERY  COMPARISON:  PET-CT 07/27/2014 and earlier studies  MEDICATIONS: Fentanyl 75 mcg IV; Versed 2.0 mg IV  CONTRAST:  159m OMNIPAQUE IOHEXOL 300 MG/ML  SOLN  ANESTHESIA/SEDATION: Total Moderate Sedation Time  89 minutes  FLUOROSCOPY TIME:  29 minutes.  48 seconds.  ACCESS: Right common femoral artery; hemostasis achieved with Exoseal.  COMPLICATIONS: None immediate  TECHNIQUE: Informed written consent was obtained from the patient after a discussion of the risks, benefits and alternatives to treatment. Questions regarding the procedure were encouraged and answered. A timeout was performed  prior to the initiation of the procedure.  The right groin was prepped and drapped in the usual sterile fashion, and a sterile drape was applied covering the operative field. Maximum  barrier sterile technique with sterile gowns and gloves were used for the procedure. A timeout was performed prior to the initiation of the procedure. Local anesthesia was provided with 1% lidocaine.  Under ultrasound guidance, the right common femoral artery was accessed with a micropuncture kit after the overlying soft tissues were anesthetized with 1% lidocaine. An ultrasound image was saved for documentation purposes. The micropuncture sheath was exchanged for a 5 [place] vascular sheath over a Bentson wire. At the conclusion of the case, closure arteriogram was performed through the side of the sheath confirming access within the right common femoral artery.  Over a Bentson wire, a C2 catheter was advanced and utilized to select the superior mesenteric artery and a superior mesenteric arteriogram was performed. The C2 catheter was then advanced cranially and utilized to select the celiac artery and a celiac arteriogram was performed.  The C2 was exchanged for a Sos catheter. The the left phrenic artery with was selectively catheterized for arteriography. This demonstrated no significant perfusion to the greater curvature of the stomach.  The Sos was exchanged for a Mickelson. With an angled roadrunner wire, this was advanced into the common hepatic artery. A renegade micro catheter was cul axially advanced into the gastroduodenal artery with the aid of the trans and guidewire. Gastroduodenal arteriography was performed. The micro catheter was exchanged for a longer device, which allowed the distal catheterization of the right gastroepiploic artery at the level of the endoscopic clips. Coil embolization above and below the level of the endoscopic clips was performed using 2 and 3 mm interlock coils. Final arteriogram through the micro catheter in the right gastroepiploic artery was performed. The micro catheter was removed. Follow-up celiac arteriogram was performed.  At this point, the procedure was terminated.  All wires and catheters and sheaths were removed from the patient. Hemostasis was achieved at the right groin and access site . A dressing was placed. The patient tolerated procedure well without immediate postprocedural complication.  FINDINGS: Superior mesenteric arteriogram demonstrates replaced right hepatic arterial artery, an anatomic variant. No significant gastric arterial supply.  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 A large right gastroepiploic artery from the GDA supplies the greater curvature of the stomach to the level of the endoscopic clips.  Left phrenic arteriogram shows no significant gastric arterial supply. If  Percutaneous coil embolization of the distal right gastroepiploic artery along the greater curvature of the stomach at the level of the endoscopic clips was performed.  Completion arteriogram shows technically successful occlusion of the distal right gastroepiploic artery inferior to the level of the endoscopic clips, with the vessel patent more proximally at the level of the gastric antrum. Follow-up celiac arteriogram shows stable arterial anatomy without evident complication.  IMPRESSION: 1. Technically successful coil embolization of the distal right gastroepiploic artery at the level of the endoscopic clips along the greater curvature of the stomach, site of known bleeding lesion.   Electronically Signed   By: Arne Cleveland M.D.   On: 08/06/2014 09:17   Ir US Guide Vasc Access Right  08/06/2014   INDICATION: Mantle cell lymphoma, with acute GI bleed secondary to gastric involvement, currently controlled with endoscopic clipping. The lesion is along the greater curvature. Prophylactic arterial embolization is requested.  EXAM: 1. Deatra Robinson  GUIDANCE FOR ARTERIAL ACCESS 2. CELIAC AND SUPERIOR MESENTERIC ARTERIOGRAM (1st ORDER) 3. LEFT PHRENIC ARTERIOGRAM (1ST ORDER) 4. GASTRODOUDENAL ARTERIOGRAM (3rd ORDER)  5. PERCUTANEOUS COIL EMBOLIZATION RIGHT GASTROEPIPLOIC ARTERY  COMPARISON:  PET-CT 07/27/2014 and earlier studies  MEDICATIONS: Fentanyl 75 mcg IV; Versed 2.0 mg IV  CONTRAST:  171m OMNIPAQUE IOHEXOL 300 MG/ML  SOLN  ANESTHESIA/SEDATION: Total Moderate Sedation Time  89 minutes  FLUOROSCOPY TIME:  29 minutes.  48 seconds.  ACCESS: Right common femoral artery; hemostasis achieved with Exoseal.  COMPLICATIONS: None immediate  TECHNIQUE: Informed written consent was obtained from the patient after a discussion of the risks, benefits and alternatives to treatment. Questions regarding the procedure were encouraged and answered. A timeout was performed prior to the initiation of the procedure.  The right groin was prepped and drapped in the usual sterile fashion, and a sterile drape was applied covering the operative field. Maximum barrier sterile technique with sterile gowns and gloves were used for the procedure. A timeout was performed prior to the initiation of the procedure. Local anesthesia was provided with 1% lidocaine.  Under ultrasound guidance, the right common femoral artery was accessed with a micropuncture kit after the overlying soft tissues were anesthetized with 1% lidocaine. An ultrasound image was saved for documentation purposes. The micropuncture sheath was exchanged for a 5 [place] sheath over a Bentson wire. At the conclusion of the case, closure arteriogram was performed through the side of the sheath confirming access within the right common femoral artery.  Over a Bentson wire, a C2 catheter was advanced and utilized to select the superior mesenteric artery and a superior mesenteric arteriogram was performed. The C2 catheter was then advanced cranially and utilized to select the celiac artery and a celiac arteriogram was performed.  The C2 was exchanged for a Sos catheter. The the left phrenic artery with was selectively catheterized for arteriography. This demonstrated no significant  perfusion to the greater curvature of the stomach.  The Sos was exchanged for a Mickelson. With an angled roadrunner wire, this was advanced into the common hepatic artery. A renegade micro catheter was cul axially advanced into the gastroduodenal artery with the aid of the trans and guidewire. Gastroduodenal arteriography was performed. The micro catheter was exchanged for a longer device, which allowed the distal catheterization of the right gastroepiploic artery at the level of the endoscopic clips. Coil embolization above and below the level of the endoscopic clips was performed using 2 and 3 mm interlock coils. Final arteriogram through the micro catheter in the right gastroepiploic artery was performed. The micro catheter was removed. Follow-up celiac arteriogram was performed.  At this point, the procedure was terminated. All wires and catheters and sheaths were removed from the patient. Hemostasis was achieved at the right groin and access site . A dressing was placed. The patient tolerated procedure well without immediate postprocedural complication.  FINDINGS: Superior mesenteric arteriogram demonstrates replaced right hepatic arterial artery, an anatomic variant. No significant gastric arterial supply.  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 A large right gastroepiploic artery from the GDA supplies the greater curvature of the stomach to the level of the endoscopic clips.  Left phrenic arteriogram shows no significant gastric arterial supply. If  Percutaneous coil embolization of the distal right gastroepiploic artery along the greater curvature of the stomach at the level of the endoscopic clips was performed.  Completion arteriogram shows technically successful occlusion of the distal right gastroepiploic artery inferior  to the level of the endoscopic clips, with the vessel patent more proximally at the level of the gastric antrum.  Follow-up celiac arteriogram shows stable arterial anatomy without evident complication.  IMPRESSION: 1. Technically successful coil embolization of the distal right gastroepiploic artery at the level of the endoscopic clips along the greater curvature of the stomach, site of known bleeding lesion.   Electronically Signed   By: Arne Cleveland M.D.   On: 08/06/2014 09:17   Ir Embo Arterial Not [place]  08/06/2014   INDICATION: Mantle cell lymphoma, with acute GI bleed secondary to gastric involvement, currently controlled with endoscopic clipping. The lesion is along the greater curvature. Prophylactic arterial embolization is requested.  EXAM: 1. ULTRAOUND GUIDANCE FOR ARTERIAL ACCESS 2. CELIAC AND SUPERIOR MESENTERIC ARTERIOGRAM (1st ORDER) 3. LEFT PHRENIC ARTERIOGRAM (1ST ORDER) 4. GASTRODOUDENAL ARTERIOGRAM (3rd ORDER) 5. PERCUTANEOUS COIL EMBOLIZATION RIGHT GASTROEPIPLOIC ARTERY  COMPARISON:  PET-CT 07/27/2014 and earlier studies  MEDICATIONS: Fentanyl 75 mcg IV; Versed 2.0 mg IV  CONTRAST:  172m OMNIPAQUE IOHEXOL 300 MG/ML  SOLN  ANESTHESIA/SEDATION: Total Moderate Sedation Time  89 minutes  FLUOROSCOPY TIME:  29 minutes.  48 seconds.  ACCESS: Right common femoral artery; hemostasis achieved with Exoseal.  COMPLICATIONS: None immediate  TECHNIQUE: Informed written consent was obtained from the patient after a discussion of the risks, benefits and alternatives to treatment. Questions regarding the procedure were encouraged and answered. A timeout was performed prior to the initiation of the procedure.  The right groin was prepped and drapped in the usual sterile fashion, and a sterile drape was applied covering the operative field. Maximum barrier sterile technique with sterile gowns and gloves were used for the procedure. A timeout was performed prior to the initiation of the procedure. Local anesthesia was provided with 1% lidocaine.  Under ultrasound guidance, the right  common femoral artery was accessed with a micropuncture kit after the overlying soft tissues were anesthetized with 1% lidocaine. An ultrasound image was saved for documentation purposes. The micropuncture sheath was exchanged for a 5 [place] sheath over a Bentson wire. At the conclusion of the case, closure arteriogram was performed through the side of the sheath confirming access within the right common femoral artery.  Over a Bentson wire, a C2 catheter was advanced and utilized to select the superior mesenteric artery and a superior mesenteric arteriogram was performed. The C2 catheter was then advanced cranially and utilized to select the celiac artery and a celiac arteriogram was performed.  The C2 was exchanged for a Sos catheter. The the left phrenic artery with was selectively catheterized for arteriography. This demonstrated no significant perfusion to the greater curvature of the stomach.  The Sos was exchanged for a Mickelson. With an angled roadrunner wire, this was advanced into the common hepatic artery. A renegade micro catheter was cul axially advanced into the gastroduodenal artery with the aid of the trans and guidewire. Gastroduodenal arteriography was performed. The micro catheter was exchanged for a longer device, which allowed the distal catheterization of the right gastroepiploic artery at the level of the endoscopic clips. Coil embolization above and below the level of the endoscopic clips was performed using 2 and 3 mm interlock coils. Final arteriogram through the micro catheter in the right gastroepiploic artery was performed. The micro catheter was removed. Follow-up celiac arteriogram was performed.  At this point, the procedure was terminated. All wires and catheters and sheaths were removed from the patient. Hemostasis was achieved at the  right groin and access site . A dressing was placed. The patient tolerated procedure well without immediate postprocedural complication.   FINDINGS: Superior mesenteric arteriogram demonstrates replaced right hepatic arterial artery, an anatomic variant. No significant gastric arterial supply.  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 A large right gastroepiploic artery from the GDA supplies the greater curvature of the stomach to the level of the endoscopic clips.  Left phrenic arteriogram shows no significant gastric arterial supply. If  Percutaneous coil embolization of the distal right gastroepiploic artery along the greater curvature of the stomach at the level of the endoscopic clips was performed.  Completion arteriogram shows technically successful occlusion of the distal right gastroepiploic artery inferior to the level of the endoscopic clips, with the vessel patent more proximally at the level of the gastric antrum. Follow-up celiac arteriogram shows stable arterial anatomy without evident complication.  IMPRESSION: 1. Technically successful coil embolization of the distal right gastroepiploic artery at the level of the endoscopic clips along the greater curvature of the stomach, site of known bleeding lesion.   Electronically Signed   By: Arne Cleveland M.D.   On: 08/06/2014 09:17    Labs:  CBC:  Recent Labs  08/03/14 1600 08/05/14 0425 08/05/14 2308 08/06/14 0400  WBC 7.7 7.3 9.6 11.2*  HGB 9.7* 9.5* 9.9* 9.9*  HCT 29.7* 27.8* 29.7* 30.8*  PLT 143* 131* 150 147*    COAGS:  Recent Labs  12/09/13 0130 08/03/14 1444  INR 1.06 1.23    BMP:  Recent Labs  08/03/14 0400 08/04/14 0405 08/05/14 2308 08/06/14 0400  NA 145 144 142 142  K 3.7 3.1* 3.0* 3.2*  CL 111 110 101 103  CO2 24 27 27 28   GLUCOSE 120* 104* 115* 120*  BUN 28* 17 9 10   CALCIUM 7.9* 7.9* 8.1* 8.2*  CREATININE 1.08 1.09 0.93 0.99  GFRNONAA 63* 63* 78* 76*  GFRAA 73* 73* >90 88*    LIVER FUNCTION TESTS:  Recent Labs  06/20/14 0946 07/11/14 0933 08/01/14 1216  08/02/14 0431  BILITOT 0.83 0.48 0.58 0.3  AST 18 19 18 14   ALT 14 16 12 8   ALKPHOS 86 76 84 60  PROT 6.0* 5.9* 6.0* 4.8*  ALBUMIN 3.6 3.6 3.6 2.6*    Assessment and Plan:  Embolization of gastroepiploic artery 12/11 in IR with Dr Vernard Gambles Doing well     I spent a total of 15 minutes face to face in clinical consultation/evaluation, greater than 50% of which was counseling/coordinating care for arterial embolization  Signed: Blaze Nylund A 08/06/2014, 12:04 PM

## 2014-08-06 NOTE — Evaluation (Signed)
Physical Therapy Evaluation Patient Details Name: TYQUEZ HOLLIBAUGH MRN: [id number] DOB: [birth year] Today's Date: 08/06/2014   History of Present Illness  80 yo male admitted with hemorrhagic shock. s/p mesenteric arteriogram, coil 08/05/14 by IR. Hx of L THA, metastatic lymphoma, HTN, chronic LE edema, BPH, PVD  Clinical Impression  On eval, pt required Mod assist for bed mobility and Min assist to stand/pivot to recliner with RW. Demonstrates general weakness, decreased activity tolerance, and impaired gait and balance. Discussed d/c plan with pt/wife-wife states plan is for home so will recommend HHPT.     Follow Up Recommendations Home health PT;Supervision/Assistance - 24 hour (pt states she feels she can manage with pt at home)    Equipment Recommendations  Rolling walker with 5" wheels (possibly. Pt has rollator already. )    Recommendations for Other Services OT consult     Precautions / Restrictions Precautions Precautions: Fall Restrictions Weight Bearing Restrictions: No      Mobility  Bed Mobility Overal bed mobility: Needs Assistance Bed Mobility: Supine to Sit     Supine to sit: Mod assist;HOB elevated     General bed mobility comments: Assist for LEs and trunk to upright (most assist for trunk). Increased time.   Transfers Overall transfer level: Needs assistance Equipment used: Rolling walker (2 wheeled) Transfers: Sit to/from [facility] Sit to Stand: Min assist;From elevated surface Stand pivot transfers: Min assist       General transfer comment: Assist to rise, stabilize, control descent, maneuver with RW. VCs safety, technique, hand placement. Pt took very small steps. Increased time.   Ambulation/Gait             General Gait Details: NT-pt fatigued after transfer.  Stairs            Wheelchair Mobility    Modified Rankin (Stroke Patients Only)       Balance                                              Pertinent Vitals/Pain Pain Assessment: No/denies pain    Home Living Family/patient expects to be discharged to:: Private residence Living Arrangements: Spouse/significant other   Type of Home: House Home Access: Stairs to enter Entrance Stairs-Rails: Right Entrance Stairs-Number of Steps: 3 Home Layout: One level;Laundry or work area in [place]: Environmental consultant - 4 wheels      Prior Function Level of Independence: Independent               Hand Dominance        Extremity/Trunk Assessment   Upper Extremity Assessment: Generalized weakness           Lower Extremity Assessment: Generalized weakness      Cervical / Trunk Assessment: Kyphotic  Communication   Communication: No difficulties  Cognition Arousal/Alertness: Awake/alert Behavior During Therapy: WFL for tasks assessed/performed Overall Cognitive Status: Within Functional Limits for tasks assessed                      General Comments      Exercises General Exercises - Lower Extremity Ankle Circles/Pumps: AROM;Both;10 reps;Seated Long Arc Quad: AROM;Both;10 reps;Seated      Assessment/Plan    PT Assessment Patient needs continued PT services  PT Diagnosis Difficulty walking;Generalized weakness   PT Problem List Decreased strength;Decreased activity tolerance;Decreased balance;Decreased mobility;Obesity;Decreased knowledge of  use of DME  PT Treatment Interventions DME instruction;Gait training;Functional mobility training;Therapeutic activities;Therapeutic exercise;Patient/family education;Balance training   PT Goals (Current goals can be found in the Care Plan section) Acute Rehab PT Goals Patient Stated Goal: home. get stronger.  PT Goal Formulation: With patient/family Time For Goal Achievement: 08/20/14 Potential to Achieve Goals: Good    Frequency Min 3X/week   Barriers to discharge        Co-evaluation               End of Session    Activity Tolerance: Patient limited by fatigue Patient left: in chair;with call bell/phone within reach;with family/visitor present           Time: 7972-8206 PT Time Calculation (min) (ACUTE ONLY): 16 min   Charges:   PT Evaluation $Initial PT Evaluation Tier I: 1 Procedure PT Treatments $Therapeutic Activity: 8-22 mins   PT G Codes:          Weston Anna, MPT Pager: [phone]

## 2014-08-07 ENCOUNTER — Inpatient Hospital Stay (HOSPITAL_COMMUNITY): Payer: Medicare Other

## 2014-08-07 DIAGNOSIS — G9340 Encephalopathy, unspecified: Secondary | ICD-10-CM

## 2014-08-07 LAB — GLUCOSE, CAPILLARY
GLUCOSE-CAPILLARY: 123 mg/dL — AB (ref 70–99)
GLUCOSE-CAPILLARY: 133 mg/dL — AB (ref 70–99)
GLUCOSE-CAPILLARY: 187 mg/dL — AB (ref 70–99)
Glucose-Capillary: 113 mg/dL — ABNORMAL HIGH (ref 70–99)
Glucose-Capillary: 114 mg/dL — ABNORMAL HIGH (ref 70–99)
Glucose-Capillary: 135 mg/dL — ABNORMAL HIGH (ref 70–99)

## 2014-08-07 LAB — URINALYSIS, ROUTINE W REFLEX MICROSCOPIC
Bilirubin Urine: NEGATIVE
GLUCOSE, UA: NEGATIVE mg/dL
Ketones, ur: NEGATIVE mg/dL
Nitrite: NEGATIVE
PH: 5.5 (ref 5.0–8.0)
Protein, ur: 30 mg/dL — AB
SPECIFIC GRAVITY, URINE: 1.017 (ref 1.005–1.030)
Urobilinogen, UA: 1 mg/dL (ref 0.0–1.0)

## 2014-08-07 LAB — URINE MICROSCOPIC-ADD ON

## 2014-08-07 LAB — MAGNESIUM: Magnesium: 2.2 mg/dL (ref 1.5–2.5)

## 2014-08-07 NOTE — Progress Notes (Signed)
Patient complaining of feeling cold and shivering. Patient looked lethargic and HR in 120s.At 1700, checked oral temp and it was 98.8, checked rectal temp and it was 102.7, Tylenol given and Dr. Charlies Silvers notified. New orders placed. Will continue to monitor patient.

## 2014-08-07 NOTE — Progress Notes (Signed)
Patient ID: Martin Morris, male   DOB: [birth year], 80 y.o.   MRN: [id number] [facility] PROGRESS NOTE  Martin Morris SFK:[id number] DOB: 1932-12-30 DOA: 08/02/2014 PCP: Martin Mustache, MD  Brief narrative:    80 year old male with past medical history of mantle cell lymphoma (diagnosed in 10/2006)undergoing active chemotherapy (under Dr. Calton Dach care), most recently has had PET scan 07/27/14 which showed diffuse progression of the disease most obvious in fundus of stomach, as well as descending colon and cecum. CT head 08/02/2014 showed possible early intracranial metastases.  Patient presented to [facility] 08/02/2014 with acute GI bleed. Patient was evaluated by GI. He was intubated for EGD which revealed malignant gastric ulcer status post endoclipping and resolution of bleeding. Please note patient received PPI gtt and blood transfusion as well. PCCM consulted for syncope and bradycardia following hematemesis. Additionally, IR consulted for elective embolization. Patinet is status post mesenteric angiogram with coil embolization of the right gastroepiploic artery 08/05/2014.  Assessment/Plan:     Principal Problem: Acute hemorrhagic shock / upper GI bleed / acute blood loss anemia  - Secondary to upper GI bleed secondary to malignant proximal gastric ulcer as seen on EGD 08/02/2014  - Patient is status post Endo Clip 3. Also, status post mesenteric arteriogram with coil embolization of the right gastroepiploic artery done by IR 08/05/2014. - Patient status post 4 unit packed red blood cells and 1 unit platelets(08/02/14) transfusion.  - Plavix and Asprin still on hold. - Continue protonix 40 mg IV Q 12 hours  - Hemoglobin remains stable at 9.9 - diet advanced to soft this am. He has tolerated clear liquid diet.   Active Problems: Malignant proximal gastric ulcer - Status post Endo Clip 3 08/02/2014. Status post mesenteric arteriogram with coil embolization right  gastroepiploic artery per interventional radiology 08/05/2014.  - Radiation oncology has been consulted and saw patient 08/03/2014 for possible radiation therapy. Rad oncology to re-eval after completion of embolization procedure if patient is a candidate for palliative radiotherapy.   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 He receives Rituxan every alternate cycle. - Last chemotherapy was given on 07/11/14, cycle #81 - CT of the head showed possible early metastatic disease. - It will be necessary to review the code status with the patient and family.  Thrombocytopenia, mild - Due to acute bleed, malignancy  - Patient received 1 unit of platelets on 12/8 due to bleeding  Chronic kidney disease (CKD), stage II (mild) - This is chronic in nature, unlikely related to his cancer.  - Baseline is 1.4. Current creatinine WNL.  Headaches with abnormal head imaging - will need MRI brain prior to discharge.   Acute respiratory failure with hypoxia - Patient was intubated for EGD. Patient has been extubated and his respiratory status remains stable.   Symptomatic bradycardia in the setting of syncopal event - Likely secondary to hemorrhagic shock. Antihypertensives on hold. Aspirin and Plavix on hold.  - Resolved.  Hypokalemia - Likely GI losses - Being supplemented   Acute encephalopathy - Likely secondary to syncopal event secondary to hemorrhagic shock.??? Early brain metastases.   Essential hypertension - has had hypotension so antihypertensives were on hold. May continue as needed hydralazine    DVT Prophylaxis  - aspirin and plavix on hold due to risk of bleeding  - SCD's bilaterally   Code Status: Full.  Family Communication: Family not at the bedside this am Disposition  Plan: home once stable   IV access:   PAC  Procedures and diagnostic studies:    Chest  x-ray 08/02/2014  CT head 08/02/2014  Upper endoscopy 08/02/2014-Dr. Henrene Pastor  IR mesenteric arteriogram, coil embolization right gastroepiploic artery per interventional radiology Dr. Vernard Gambles 08/05/2014  Intubation 08/02/2014>>> extubation 08/03/2014  Medical Consultants:   PCCM Dr. Elsworth Soho 08/02/2014  Gastroenterology: Dr. Henrene Pastor 08/02/2014  Oncology: Dr. Alvy Bimler 08/03/2014  Interventional radiology: Dr. Anselm Pancoast 08/04/2014  Radiation/ oncology: Dr. Tammi Klippel 08/03/2014  Other Consultants:   Physical therapy  IAnti-Infectives:    None   Leisa Lenz, MD  [facility] Pager [phone]  If 7PM-7AM, please contact night-coverage [email or web address] Password [email or web address] 08/07/2014, 8:25 AM   LOS: 5 days    HPI/Subjective: No acute overnight events.  Objective: Filed Vitals:   08/06/14 1740 08/06/14 2248 08/06/14 2323 08/07/14 0601  BP: 122/67 121/61  117/63  Pulse: 89 101  92  Temp: 99.9 F (37.7 C) 101.2 F (38.4 C) 98.2 F (36.8 C) 98.7 F (37.1 C)  TempSrc: Oral Oral Oral Oral  Resp: 22 18  18   Height:      Weight:      SpO2: 98% 94%  96%    Intake/Output Summary (Last 24 hours) at 08/07/14 0825 Last data filed at 08/07/14 0603  Gross per 24 hour  Intake   1065 ml  Output    725 ml  Net    340 ml    Exam:   General:  Pt is alert, follows commands appropriately, not in acute distress  Cardiovascular: Regular rate and rhythm, S1/S2 appreciated   Respiratory: no wheezing, no crackles, no rhonchi  Abdomen: Soft, non tender, non distended, bowel sounds present  Extremities: trace pitting pedal edema, pulses DP and PT palpable bilaterally  Neuro: Grossly nonfocal  Data Reviewed: Basic Metabolic Panel:  Recent Labs Lab 08/03/14 0400 08/04/14 0405 08/05/14 2308 08/06/14 0400 08/06/14 1340 08/07/14 0512  NA 145 144 142 142 138  --   K 3.7 3.1* 3.0* 3.2* 4.1  --   CL 111 110 101 103 101  --   CO2 24 27 27 28 25   --   GLUCOSE 120* 104* 115*  120* 253*  --   BUN 28* 17 9 10 11   --   CREATININE 1.08 1.09 0.93 0.99 0.98  --   CALCIUM 7.9* 7.9* 8.1* 8.2* 7.9*  --   MG  --   --   --  1.7  --  2.2   Liver Function Tests:  Recent Labs Lab 08/01/14 1216 08/02/14 0431  AST 18 14  ALT 12 8  ALKPHOS 84 60  BILITOT 0.58 0.3  PROT 6.0* 4.8*  ALBUMIN 3.6 2.6*   No results for input(s): LIPASE, AMYLASE in the last 168 hours. No results for input(s): AMMONIA in the last 168 hours. CBC:  Recent Labs Lab 08/01/14 1215 08/02/14 0431  08/03/14 0910 08/03/14 1600 08/05/14 0425 08/05/14 2308 08/06/14 0400  WBC 6.9 8.8  < > 7.7 7.7 7.3 9.6 11.2*  NEUTROABS 3.8 5.2  --   --   --   --   --   --   HGB 11.8* 8.4*  < > 9.8* 9.7* 9.5* 9.9* 9.9*  HCT 36.3* 25.1*  < > 29.1* 29.7* 27.8* 29.7* 30.8*  MCV 101.1* 101.6*  < > 89.8 91.4 91.4 91.7 92.2  PLT 196 166  < > 137* 143* 131* 150 147*  < > = values in this interval not displayed. Cardiac  Enzymes:  Recent Labs Lab 08/02/14 0542  TROPONINI <0.30   BNP: Invalid input(s): POCBNP CBG:  Recent Labs Lab 08/06/14 0013 08/06/14 1126 08/06/14 1738 08/06/14 2343 08/07/14 0605  GLUCAP 120* 101* 124* 148* 123*    Recent Results (from the past 240 hour(s))  MRSA PCR Screening     Status: None   Collection Time: 08/02/14  6:17 AM  Result Value Ref Range Status   MRSA by PCR NEGATIVE NEGATIVE Final     Scheduled Meds: . antiseptic oral rinse  7 mL Mouth Rinse BID  . pantoprazole (PROTONIX) IV  40 mg Intravenous Q12H   Continuous Infusions: . sodium chloride 75 mL/hr at 08/07/14 0702

## 2014-08-07 NOTE — Progress Notes (Signed)
Result of CXR called to on call  PA. Result: CHF, mild pulm edema. Patient denies SOB, O2 sat 94% at 4L/min.Lung sound diminished with scattered ronchi/wheezes. HOB semifowlers.gen 1+ pitting edema, sputum frothy per report. No new order received except to call on call when UA resulted. Will monitor  Closely.

## 2014-08-08 ENCOUNTER — Inpatient Hospital Stay (HOSPITAL_COMMUNITY): Payer: Medicare Other

## 2014-08-08 DIAGNOSIS — I369 Nonrheumatic tricuspid valve disorder, unspecified: Secondary | ICD-10-CM

## 2014-08-08 LAB — BLOOD GAS, ARTERIAL
Acid-Base Excess: 1.4 mmol/L (ref 0.0–2.0)
BICARBONATE: 24.8 meq/L — AB (ref 20.0–24.0)
DRAWN BY: 422461
FIO2: 0.4 %
MODE: POSITIVE
O2 Saturation: 91 %
PATIENT TEMPERATURE: 99.1
PEEP: 5 cmH2O
PH ART: 7.448 (ref 7.350–7.450)
Pressure control: 7 cmH2O
TCO2: 23.3 mmol/L (ref 0–100)
pCO2 arterial: 36.6 mmHg (ref 35.0–45.0)
pO2, Arterial: 68.1 mmHg — ABNORMAL LOW (ref 80.0–100.0)

## 2014-08-08 LAB — CBC
HEMATOCRIT: 28.6 % — AB (ref 39.0–52.0)
Hemoglobin: 9.1 g/dL — ABNORMAL LOW (ref 13.0–17.0)
MCH: 30.1 pg (ref 26.0–34.0)
MCHC: 31.8 g/dL (ref 30.0–36.0)
MCV: 94.7 fL (ref 78.0–100.0)
PLATELETS: 152 10*3/uL (ref 150–400)
RBC: 3.02 MIL/uL — ABNORMAL LOW (ref 4.22–5.81)
RDW: 17.2 % — AB (ref 11.5–15.5)
WBC: 10 10*3/uL (ref 4.0–10.5)

## 2014-08-08 LAB — GLUCOSE, CAPILLARY
GLUCOSE-CAPILLARY: 148 mg/dL — AB (ref 70–99)
GLUCOSE-CAPILLARY: 122 mg/dL — AB (ref 70–99)

## 2014-08-08 MED ORDER — FUROSEMIDE 10 MG/ML IJ SOLN
20.0000 mg | Freq: Once | INTRAMUSCULAR | Status: AC
  Administered 2014-08-08: 20 mg via INTRAVENOUS

## 2014-08-08 MED ORDER — METOPROLOL TARTRATE 1 MG/ML IV SOLN
INTRAVENOUS | Status: AC
  Administered 2014-08-08: 5 mg
  Filled 2014-08-08: qty 5

## 2014-08-08 MED ORDER — FUROSEMIDE 10 MG/ML IJ SOLN
INTRAMUSCULAR | Status: AC
  Administered 2014-08-08: 40 mg
  Filled 2014-08-08: qty 4

## 2014-08-08 MED ORDER — FUROSEMIDE 10 MG/ML IJ SOLN
INTRAMUSCULAR | Status: AC
  Filled 2014-08-08: qty 2

## 2014-08-08 MED ORDER — ALBUTEROL SULFATE (2.5 MG/3ML) 0.083% IN NEBU
2.5000 mg | INHALATION_SOLUTION | Freq: Once | RESPIRATORY_TRACT | Status: AC
  Administered 2014-08-08: 2.5 mg via RESPIRATORY_TRACT

## 2014-08-08 MED ORDER — FUROSEMIDE 10 MG/ML IJ SOLN
40.0000 mg | Freq: Once | INTRAMUSCULAR | Status: AC
  Administered 2014-08-08: 40 mg via INTRAVENOUS
  Filled 2014-08-08: qty 4

## 2014-08-08 MED ORDER — METOPROLOL TARTRATE 1 MG/ML IV SOLN
5.0000 mg | Freq: Once | INTRAVENOUS | Status: AC
  Administered 2014-08-08: 5 mg via INTRAVENOUS

## 2014-08-08 MED ORDER — ALBUTEROL SULFATE (2.5 MG/3ML) 0.083% IN NEBU
INHALATION_SOLUTION | RESPIRATORY_TRACT | Status: AC
  Filled 2014-08-08: qty 3

## 2014-08-08 MED ORDER — METOPROLOL TARTRATE 1 MG/ML IV SOLN
INTRAVENOUS | Status: AC
  Filled 2014-08-08: qty 5

## 2014-08-08 MED ORDER — CEFTRIAXONE SODIUM IN DEXTROSE 20 MG/ML IV SOLN
1.0000 g | Freq: Every day | INTRAVENOUS | Status: DC
  Administered 2014-08-08 – 2014-08-11 (×5): 1 g via INTRAVENOUS
  Filled 2014-08-08 (×7): qty 50

## 2014-08-08 MED ORDER — POTASSIUM CHLORIDE 20 MEQ/15ML (10%) PO SOLN
40.0000 meq | Freq: Once | ORAL | Status: AC
  Administered 2014-08-08: 40 meq via ORAL
  Filled 2014-08-08: qty 30

## 2014-08-08 MED ORDER — GADOBENATE DIMEGLUMINE 529 MG/ML IV SOLN
20.0000 mL | Freq: Once | INTRAVENOUS | Status: AC | PRN
  Administered 2014-08-08: 20 mL via INTRAVENOUS

## 2014-08-08 NOTE — Progress Notes (Signed)
PT Cancellation Note  Patient Details Name: Martin Morris MRN: [id number] DOB: 11/15/1932   Cancelled Treatment:    Reason Eval/Treat Not Completed: Medical issues which prohibited therapy (will hold PT today and check back on another day. )   Weston Anna, MPT Pager: [phone]

## 2014-08-08 NOTE — Progress Notes (Addendum)
Shift event: RN paged regarding pt with diffuse rhonchi, febrile, on 4 L [place] with 94%O2 sats. Earlier CXR with mild pulm edema-  ordered IV Lasix 20mg  X1, and decreased fluids to NS 32ml/hr.  UTI - UA with evidence of UTI- Placed on IV Rocephin, ordered urine cultures.   Update: Pt c/o's of shortness of breath,  O2 sats dropping 70s despite ventimask, tachycardic HR 140s. Alert and oriented, with inc work of breathing  Acute respiratory failure with hypoxia  -pt with SOB and desatting despite Ventimask, placed on 100% NRB, ordered Lasix 40mg  IV X1.  However, with continued to work to breath.  Repeat CXR with clearing pulm edema.  Pt is subsequently transferred to SDU and placed on Bipap. -ABG pending  Tachycardia -HR in 140s, EKG with ST wth short PR -Given Lopressor 5mg  X2. HR stable [facility] [phone] [facility]

## 2014-08-08 NOTE — Progress Notes (Signed)
  Echocardiogram 2D Echocardiogram has been performed.  Martin Morris 08/08/2014, 3:40 PM

## 2014-08-08 NOTE — Progress Notes (Addendum)
Patient ID: Martin Morris, male   DOB: 09/20/1932, 80 y.o.   MRN: [id number] [facility] PROGRESS NOTE  [name] MWN:[id number] DOB: 10-08-1932 DOA: 08/02/2014 PCP: [name], MD  Brief narrative:    80 year old male with past medical history of mantle cell lymphoma (diagnosed in 10/2006)undergoing active chemotherapy (under Dr. Calton Dach care), most recently has had PET scan 07/27/14 which showed diffuse progression of the disease most obvious in fundus of stomach, as well as descending colon and cecum. CT head 08/02/2014 showed possible early intracranial metastases.  Patient presented to [facility] 08/02/2014 with acute GI bleed. Patient was evaluated by GI. He was intubated for EGD which revealed malignant gastric ulcer status post endoclipping and resolution of bleeding. Please note patient received PPI gtt and blood transfusion as well. PCCM consulted for syncope and bradycardia following hematemesis. Additionally, IR consulted for elective embolization. Patinet is status post mesenteric angiogram with coil embolization of the right gastroepiploic artery 08/05/2014. Overnight, patient went into respiratory distress. He was moved to stepdown unit. His urinalysis was significant for urinary tract infection and patient was started on Rocephin.  Assessment/Plan:     Prncipal Problem: Acute hemorrhagic shock / upper GI bleed / acute blood loss anemia  - Secondary to upper GI bleed secondary to malignant proximal gastric ulcer as seen on EGD 08/02/2014  - Patient is status post Endo Clip 3. Also, status post mesenteric arteriogram with coil embolization of the right gastroepiploic artery done by IR 08/05/2014. - Patient status post 4 unit packed red blood cells and 1 unit platelets(08/02/14) transfusion.  - Plavix and Asprin still on hold. - Continue protonix 40 mg IV Q 12 hours  - Patient was evaluated by radiation oncology for palliative radiation to lymphoma. Per their  recommendation, palliative gastric radiation likely to take place outpatient.  Active Problems: Acute respiratory failure with hypoxia - Chest x-ray on 08/08/2014 showed clearing of pulmonary edema. Patient received 1 dose of Lasix overnight. He was moved to stepdown unit because he required BiPAP. - Appreciate critical care input.  Urinary tract infection - Patient febrile overnight 08/07/2014. Urinalysis with evidence of infection. Patient started on Rocephin. - Follow up urine culture results.  Malignant proximal gastric ulcer - Status post Endo Clip 3 08/02/2014. Status post mesenteric arteriogram with coil embolization right gastroepiploic artery per interventional radiology 08/05/2014.  - Radiation oncology has been consulted and saw patient 08/03/2014 for possible radiation therapy. Rad oncology re-evaluated 08/08/2014 for palliative gastric radiation. This will likely take place outpatient.  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 He receives Rituxan every alternate cycle. - Last chemotherapy was given on 07/11/14, cycle #81 - CT of the head showed possible early metastatic disease. - MRI brain with contrast ordered, pending  Thrombocytopenia, mild - Due to acute bleed, malignancy  - Patient received 1 unit of platelets on 12/8 due to bleeding  Chronic kidney disease (CKD), stage II (mild) - This is chronic in nature, unlikely related to his cancer.  - Baseline is 1.4. Current creatinine WNL.  Headaches with abnormal head imaging - Follow-up MRI of the brain.  Symptomatic bradycardia in the setting of syncopal event - Likely secondary to hemorrhagic shock. Antihypertensives on hold. Aspirin and Plavix on hold.  - Resolved.  Hypokalemia - Likely GI losses - Being supplemented   Acute encephalopathy - Likely secondary to syncopal event secondary to hemorrhagic shock.???  Early brain metastases.  Essential hypertension - has had hypotension so antihypertensives were on hold. May continue as needed hydralazine    DVT Prophylaxis  - aspirin and plavix on hold due to risk of bleeding  - SCD's bilaterally   Code Status: Full.  Family Communication: Family at the bedside this am Disposition Plan: Remains inpatient  IV access:   PAC  Procedures and diagnostic studies:    Chest x-ray 08/02/2014  CT head 08/02/2014  Upper endoscopy 08/02/2014-Dr. Henrene Pastor  IR mesenteric arteriogram, coil embolization right gastroepiploic artery per interventional radiology Dr. Vernard Gambles 08/05/2014  Intubation 08/02/2014>>> extubation 08/03/2014  Medical Consultants:   PCCM Dr. Elsworth Soho 08/02/2014; 08/08/2014  Gastroenterology: Dr. Henrene Pastor 08/02/2014  Oncology: Dr. Alvy Bimler 08/03/2014  Interventional radiology: Dr. Anselm Pancoast 08/04/2014  Radiation/ oncology: Dr. Tammi Klippel 08/03/2014  Other Consultants:   Physical therapy  IAnti-Infectives:    Rocephin 08/07/2014 --.    Leisa Lenz, MD  [facility] Pager [phone]  If 7PM-7AM, please contact night-coverage [email or web address] Password [email or web address] 08/08/2014, 2:54 PM   LOS: 6 days    HPI/Subjective: No acute overnight events.  Objective: Filed Vitals:   08/08/14 1100 08/08/14 1200 08/08/14 1300 08/08/14 1400  BP: 88/48 87/51 88/55  96/52  Pulse: 85 80 77 84  Temp:  98.6 F (37 C)    TempSrc:  Oral    Resp: 13 16 14 16   Height:      Weight:      SpO2: 98% 98% 98% 99%    Intake/Output Summary (Last 24 hours) at 08/08/14 1454 Last data filed at 08/08/14 1400  Gross per 24 hour  Intake 1735.42 ml  Output   2700 ml  Net -964.58 ml    Exam:   General:  Pt is on BIPAP, no distress  Cardiovascular: Regular rate and rhythm, S1/S2 appreciated  Respiratory: Clear to auscultation bilaterally, no wheezing, no crackles, no rhonchi  Abdomen:  non tender, non distended, bowel sounds  present  Extremities: No edema, pulses DP and PT palpable bilaterally  Neuro: Grossly nonfocal  Data Reviewed: Basic Metabolic Panel:  Recent Labs Lab 08/03/14 0400 08/04/14 0405 08/05/14 2308 08/06/14 0400 08/06/14 1340 08/07/14 0512  NA 145 144 142 142 138  --   K 3.7 3.1* 3.0* 3.2* 4.1  --   CL 111 110 101 103 101  --   CO2 24 27 27 28 25   --   GLUCOSE 120* 104* 115* 120* 253*  --   BUN 28* 17 9 10 11   --   CREATININE 1.08 1.09 0.93 0.99 0.98  --   CALCIUM 7.9* 7.9* 8.1* 8.2* 7.9*  --   MG  --   --   --  1.7  --  2.2   Liver Function Tests:  Recent Labs Lab 08/02/14 0431  AST 14  ALT 8  ALKPHOS 60  BILITOT 0.3  PROT 4.8*  ALBUMIN 2.6*   No results for input(s): LIPASE, AMYLASE in the last 168 hours. No results for input(s): AMMONIA in the last 168 hours. CBC:  Recent Labs Lab 08/02/14 0431  08/03/14 1600 08/05/14 0425 08/05/14 2308 08/06/14 0400 08/08/14 0600  WBC 8.8  < > 7.7 7.3 9.6 11.2* 10.0  NEUTROABS 5.2  --   --   --   --   --   --   HGB 8.4*  < > 9.7* 9.5* 9.9* 9.9* 9.1*  HCT 25.1*  < > 29.7* 27.8* 29.7* 30.8* 28.6*  MCV 101.6*  < > 91.4 91.4 91.7 92.2 94.7  PLT 166  < >  143* 131* 150 147* 152  < > = values in this interval not displayed. Cardiac Enzymes:  Recent Labs Lab 08/02/14 0542  TROPONINI <0.30   BNP: Invalid input(s): POCBNP CBG:  Recent Labs Lab 08/07/14 0605 08/07/14 1305 08/07/14 1853 08/07/14 2312 08/07/14 2345  GLUCAP 123* 133* 187* 148* 135*    Recent Results (from the past 240 hour(s))  MRSA PCR Screening     Status: None   Collection Time: 08/02/14  6:17 AM  Result Value Ref Range Status   MRSA by PCR NEGATIVE NEGATIVE Final    Comment:        The GeneXpert MRSA Assay (FDA approved for NASAL specimens only), is one component of a comprehensive MRSA colonization surveillance program. It is not intended to diagnose MRSA infection nor to guide or monitor treatment for MRSA infections.       Scheduled Meds: . antiseptic oral rinse  7 mL Mouth Rinse BID  . cefTRIAXone (ROCEPHIN)  IV  1 g Intravenous QHS  . furosemide  40 mg Intravenous Once  . pantoprazole (PROTONIX) IV  40 mg Intravenous Q12H  . potassium chloride  40 mEq Oral Once   Continuous Infusions: . sodium chloride 50 mL/hr at 08/08/14 0137

## 2014-08-08 NOTE — Progress Notes (Signed)
  Radiation Oncology         ([phone]) [phone] ________________________________  Name: Martin Morris MRN: [id number]  Date: 08/02/2014  DOB: June 01, 1933  Chart Note:  I reviewed this patient's most recent findings and wanted to take a minute to document my impression.  The patient presented with a gastric bleed from a deposit of lymphoma.  He has undergone endoscopic intervention and now embolization.  He is hemodynamically stable without active bleeding at present.  Localized radiotherapy to the lymphoma deposit could be used now to help lower the likelihood of future bleeding, but, the logistics of radiotherapy and the toxicity including nausea may introduce additional challenges for this gentleman.  I would consider continued medical support/stabilization to progress to hospital discharge.  As an outpatient, we could coordinate gastric radiotherapy once the patient is otherwise stable and in better condition, if clinically warranted.  ________________________________  Sheral Apley. Tammi Klippel, M.D.

## 2014-08-08 NOTE — Progress Notes (Signed)
Noted patient O2 sat kept on dropping to 70's. Also observed patient having chills and c/o SOB.Increased O2 to 6L/min and checked rectal temp-99.1.  Patient cont to desat to 60's, venturi mask started at 40%,went up a little to 96% and dropped again to 70's. NRB started, called respiratory and rapid response. HR sustaining in the 140's at that time. Hospitalist Clinton PA made aware abt patients change in condition. New orders received; Lasix 40 mg, Lopressor 5 mg , Bipap, PCXR STAT. Another dose of lopressor 5 mg iv given with the HR in 120's. Transferred patient to 1237. Report given via phone to Gilmer Mor, the receiving nurse.   08/08/14 0418  Vitals  Temp 99.1 F (37.3 C)  Temp Source Rectal  BP (!) 142/80 mmHg  BP Location Right Arm  BP Method Automatic  Patient Position (if appropriate) Lying  Pulse Rate (!) 117  Pulse Rate Source Dinamap  Resp (!) 26  Oxygen Therapy  SpO2 (!) 79 %  O2 Device Nasal Cannula  O2 Flow Rate (L/min) 4 L/min

## 2014-08-08 NOTE — Progress Notes (Signed)
Pt placed on NIV PC, BiPAP, on Servo-i upon arrival to ICU.  Pt placed on settings of PC above PEEP of 7; PEEP of 5; FiO2 of 0.40; and BUR of 10.  Pt is currently stable with HR of 99; SpO2 of 97; and B/P of 113/61.  Will get ABG in 30 mins and continue to monitor.

## 2014-08-08 NOTE — Progress Notes (Signed)
PULMONARY / CRITICAL CARE MEDICINE   Name: Martin Morris MRN: [id number] DOB: [birth year]    ADMISSION DATE:  08/02/2014 CONSULTATION DATE:  12/9  REFERRING MD :  Charlies Silvers   CHIEF COMPLAINT:  Acute UGIB   INITIAL PRESENTATION:  80 year old male undergoing active chemo-therapy for Mantle cell lymphoma. Most recent PET scan demonstrated diffuse progression of disease (see study section below), most notably tumor in fundus of stomach. Admitted to [facility] on 12/8 w/ acute UGIB, treated w/ PPI gtt and blood x-fusion. PCCM asked to consult later that am due to bradycardia and syncopal event following episode of hematemesis.    STUDIES:  PET scan 12/2: most recent PET scan shows extensive hypermetabolic foci.. Including: area which likely represents tumor in the fundus of the stomach, descending colon and cecum . Also has hypermetabolism throughout the neck, chest and abdomen, as detailed above. Sites are highlyvaried and include posterior nasopharyngeal mucosa (left greater than right), pleura, left hilar lymph node, gastric mucosa, gastrohepatic ligament lymph node and colonic lesions CT head 12/8: Diffuse atrophy and small vessel ischemic changes. Early intracranial metastases are not excluded. No significant mass effect. No intracranial hemorrhage. MRI 12/14>>>  SIGNIFICANT EVENTS: 12/8 EGD-malignant gastric ulcer - clipping 12/9 extubated 12/11 s/p embolization and coiling 12/13 spiked temp; increased SOB, transferred back to SDU, placed on BIPAP and given lasix-->better 12/14: PCCM asked to eval again   SUBJECTIVE:  No distress.  VITAL SIGNS: Temp:  [98.7 F (37.1 C)-102.7 F (39.3 C)] 101.6 F (38.7 C) (12/14 0800) Pulse Rate:  [87-117] 89 (12/14 0930) Resp:  [18-28] 22 (12/14 0930) BP: (85-142)/(47-80) 95/57 mmHg (12/14 0900) SpO2:  [67 %-100 %] 97 % (12/14 0930) FiO2 (%):  [40 %] 40 % (12/14 0915) Weight:  [98.3 kg (216 lb 11.4 oz)] 98.3 kg (216 lb 11.4 oz) (12/14 0636)   3  liters  HEMODYNAMICS:   VENTILATOR SETTINGS: Vent Mode:  [-] BIPAP;PCV FiO2 (%):  [40 %] 40 % Set Rate:  [10 bmp] 10 bmp PEEP:  [5 cmH20] 5 cmH20 INTAKE / OUTPUT:  Intake/Output Summary (Last 24 hours) at 08/08/14 0948 Last data filed at 08/08/14 0900  Gross per 24 hour  Intake 1685.42 ml  Output   2500 ml  Net -814.58 ml    PHYSICAL EXAMINATION: General:  80 year old male,  pale  Neuro:  Awake, oriented  HEENT:  Awake, alert, no focal def  Cardiovascular:  rrr Lungs:  Faint crackles both bases  Abdomen:  Soft, non-tender + bowel sound  Musculoskeletal:  weak Skin:  Bilateral LE edema w/ chronic venous stasis changes   LABS:  CBC  Recent Labs Lab 08/05/14 2308 08/06/14 0400 08/08/14 0600  WBC 9.6 11.2* 10.0  HGB 9.9* 9.9* 9.1*  HCT 29.7* 30.8* 28.6*  PLT 150 147* 152   Coag's  Recent Labs Lab 08/03/14 1444  INR 1.23   BMET  Recent Labs Lab 08/05/14 2308 08/06/14 0400 08/06/14 1340  NA 142 142 138  K 3.0* 3.2* 4.1  CL 101 103 101  CO2 27 28 25   BUN 9 10 11   CREATININE 0.93 0.99 0.98  GLUCOSE 115* 120* 253*   Electrolytes  Recent Labs Lab 08/05/14 2308 08/06/14 0400 08/06/14 1340 08/07/14 0512  CALCIUM 8.1* 8.2* 7.9*  --   MG  --  1.7  --  2.2   Sepsis Markers No results for input(s): LATICACIDVEN, PROCALCITON, O2SATVEN in the last 168 hours. ABG  Recent Labs Lab 08/02/14 1055 08/08/14  0555  PHART 7.365 7.448  PCO2ART 41.3 36.6  PO2ART 325.0* 68.1*   Liver Enzymes  Recent Labs Lab 08/01/14 1216 08/02/14 0431  AST 18 14  ALT 12 8  ALKPHOS 84 60  BILITOT 0.58 0.3  ALBUMIN 3.6 2.6*   Cardiac Enzymes  Recent Labs Lab 08/02/14 0542  TROPONINI <0.30   Glucose  Recent Labs Lab 08/06/14 2343 08/07/14 0605 08/07/14 1305 08/07/14 1853 08/07/14 2312 08/07/14 2345  GLUCAP 148* 123* 133* 187* 148* 135*    Imaging Dg Chest Port 1 View  08/07/2014   CLINICAL DATA:  Initial evaluation for fever  EXAM: PORTABLE  CHEST - 1 VIEW  COMPARISON:  08/02/2014  FINDINGS: Stable mild cardiac enlargement. Moderate vascular congestion. Mild diffuse interstitial prominence.  Stable Port-A-Cath on the right. Status post removal of endotracheal tube and enteric tube. Mild bibasilar atelectasis. No significant pleural effusion.  IMPRESSION: Findings suggest congestive heart failure with mild interstitial pulmonary edema   Electronically Signed   By: Skipper Cliche M.D.   On: 08/07/2014 18:44  Right > left airspace disease. Has improved since lasix on 12/13. Suspect edema  ASSESSMENT / PLAN:  PULMONARY OETT 12/8>> 12/9 A:  H/o OSA Acute resp failure 12/13 in setting of pulmonary edema. 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 Improved  Hypokalemia P:   Avoid hypotension Hold antihypertensives Strict I&O  GASTROINTESTINAL A:   Acute UGIB. 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 The patient may need repeat biopsy. If repeat biopsy confirmed recurrence of mantle cell lymphoma, the current guidelines suggested treatment with Ibrutinib. Possible palliative xrt to malignant tumor   INFECTIOUS A:  Fever, chills 12/13 P:   Trend  fever curve UC 12/13>>> BC 12/13>>> Rocephin 12/13>>>  ENDOCRINE A:  Mild hyperglycemia  P:   ssi protocol   NEUROLOGIC A:   Transient acute encephalopathy (due to syncopal event w/ GIB)-->resolved.  Early brain mets  P:   RASS goal: 0 Supportive care  Hold all sedating meds  For MRI brain 12/14  FAMILY  - Updates: family updated12/10  - Inter-disciplinary family meet or Palliative Care meeting due by:  12/13  NP SUMMARY STATEMENT No distress. Seems to have responded favorably to lasix. Suspect that this was d/t pulmonary edema. Awaiting MRI of brain to further assess METs, also Onc is wondering about palliative XRT to gastric tumor now that he is s/p coiling. He remains full code at this point. If we are getting to more palliative interventions than setting some limitations would be appropriate. Will f/u on the MRI and try to meet w/ family on 12/15  Erick Colace ACNP-BC [place] Pager # [phone]  OR # [phone] if no answer   MD ASSESSMENT AND SUMMARY STATEMENT    08/08/2014, 9:48 AM

## 2014-08-08 NOTE — Progress Notes (Signed)
Rapid Response Event Note  Overview:  called stating that patient was desating and having a hard time breathing.  0428    Initial Focused Assessment:0430-patient in bed with clear respiratory distress. Patient shaking and using accessory muscles to breath.  BP 112/94, R 36, P 142, temp 99.1 rectal reported by nurse, 99% on venti mask at 40%.     Interventions:Md called and order given for Lasix 40mg  Iv, Lopressor 5mg  IV, and albuterol treatment to be given.  Given at 0435-Lasix, Albuterol and Lopressor.  Patient in Sinus Tachy.  Clear yellow urine in foley noted and sufficient amount. 0445-another dose of Lopressor 5mg  Iv given.  Patient still with R 32, P 108, BP 113/74, 98% Venti mask at 40%.  Patient remains using accessory muscles to breath, with wheezes and crackles heard throughout lung fields.  Notified MD, Stat portable chest x-ray ordered, and patient to be transferred to [facility] for Bipap and ABG.   Patient transported to ICU room 1237. Respiratory at bedside and RN.  Patients wife waiting in waiting room and reported that son was notified of patients change in condition.  Wife with no questions at this time.     Event Summary:   at   patient in ICU room 1237 for Bipap and ABG    at          [facility], [place]

## 2014-08-08 NOTE — Progress Notes (Signed)
At 2029, pt seen, currently on room air, HR100, rr20, spo2 96%.  No respiratory distress noted or voiced by pt at this time.  RT will continue to assess for need for bipap.

## 2014-08-09 DIAGNOSIS — R0600 Dyspnea, unspecified: Secondary | ICD-10-CM

## 2014-08-09 DIAGNOSIS — R930 Abnormal findings on diagnostic imaging of skull and head, not elsewhere classified: Secondary | ICD-10-CM

## 2014-08-09 DIAGNOSIS — I639 Cerebral infarction, unspecified: Secondary | ICD-10-CM

## 2014-08-09 DIAGNOSIS — D649 Anemia, unspecified: Secondary | ICD-10-CM

## 2014-08-09 LAB — COMPREHENSIVE METABOLIC PANEL WITH GFR
ALT: 10 U/L (ref 0–53)
AST: 27 U/L (ref 0–37)
Albumin: 2.1 g/dL — ABNORMAL LOW (ref 3.5–5.2)
Alkaline Phosphatase: 73 U/L (ref 39–117)
Anion gap: 11 (ref 5–15)
BUN: 20 mg/dL (ref 6–23)
CO2: 30 meq/L (ref 19–32)
Calcium: 8 mg/dL — ABNORMAL LOW (ref 8.4–10.5)
Chloride: 101 meq/L (ref 96–112)
Creatinine, Ser: 1.43 mg/dL — ABNORMAL HIGH (ref 0.50–1.35)
GFR calc Af Amer: 52 mL/min — ABNORMAL LOW
GFR calc non Af Amer: 45 mL/min — ABNORMAL LOW
Glucose, Bld: 128 mg/dL — ABNORMAL HIGH (ref 70–99)
Potassium: 3.1 meq/L — ABNORMAL LOW (ref 3.7–5.3)
Sodium: 142 meq/L (ref 137–147)
Total Bilirubin: 0.2 mg/dL — ABNORMAL LOW (ref 0.3–1.2)
Total Protein: 5.1 g/dL — ABNORMAL LOW (ref 6.0–8.3)

## 2014-08-09 LAB — GLUCOSE, CAPILLARY
Glucose-Capillary: 115 mg/dL — ABNORMAL HIGH (ref 70–99)
Glucose-Capillary: 144 mg/dL — ABNORMAL HIGH (ref 70–99)
Glucose-Capillary: 129 mg/dL — ABNORMAL HIGH (ref 70–99)

## 2014-08-09 LAB — URINE CULTURE: Colony Count: >=100000

## 2014-08-09 MED ORDER — POTASSIUM CHLORIDE CRYS ER 20 MEQ PO TBCR
40.0000 meq | EXTENDED_RELEASE_TABLET | Freq: Once | ORAL | Status: AC
  Administered 2014-08-09: 40 meq via ORAL
  Filled 2014-08-09: qty 2

## 2014-08-09 MED ORDER — ATENOLOL 50 MG PO TABS
50.0000 mg | ORAL_TABLET | Freq: Every day | ORAL | Status: DC
  Administered 2014-08-09 – 2014-08-12 (×3): 50 mg via ORAL
  Filled 2014-08-09 (×2): qty 1
  Filled 2014-08-09: qty 2
  Filled 2014-08-09: qty 1

## 2014-08-09 NOTE — Progress Notes (Signed)
Bipap not indicated at this time.  HR98, rr21, spo2 98% on 2lnc.  No increased wob or respiratory distress noted.  This RT asked RN to request order for bipap to be changed to prn.

## 2014-08-09 NOTE — Consult Note (Signed)
Stroke Consult    Chief Complaint: stroke HPI: Martin Morris is an 80 y.o. male past medical history of mantle cell lymphoma (diagnosed in 10/2006) undergoing active chemotherapy (under Dr. Calton Dach care), most recently has had PET scan 07/27/14 which showed diffuse progression of the disease most obvious in fundus of stomach, as well as descending colon and cecum. CT head 08/02/2014 showed possible early intracranial metastases.   Patient presented to [facility] 08/02/2014 with acute GI bleed. Patient was evaluated by GI. He was intubated for EGD which revealed malignant gastric ulcer status post endoclipping and resolution of bleeding. Patient received PPI gtt and blood transfusion as well. PCCM consulted for syncope and bradycardia following hematemesis. Additionally, IR consulted for elective embolization. Patinet is status post mesenteric angiogram with coil embolization of the right gastroepiploic artery 08/05/2014. Radiation oncology has seen the patient in consultation for input on palliative radiotherapy to lymphoma. Due to acute illness the recommendation is for outpatient follow-up. Additionally, MRI of the brain was done to evaluate the possibility of intracranial metastases seen on CT head. MRI did not reveal metastatic disease in the brain but it did show acute posterior circulation infarct in the left cerebellum and right occipital lobe. Patient is not on aspirin and Plavix because of risk of bleeding.   Date last known well: 08/02/2014 Time last known well: 0800 tPA Given: no, out of window, recent GI bleed  Past Medical History  Diagnosis Date  . Hypertension   . CAD 11/12/2007    a. s/p CABG;  b. Cath 7/11 Patent LIMA to the LAD. Previously known occlusion of a saphenous vein graft to diagonal and sequential to obtuse marginals. Severe diffuse native obtuse marginal and diagonal branch vessel disease. Preserved ejection fraction.;  c. 03/2013 Lexi CL: mild inflat and inf ischemia, EF  54%->initial med rx.  Marland Kitchen HYPERLIPIDEMIA 11/12/2007  . HYPERTENSION 11/12/2007  . PERIPHERAL VASCULAR DISEASE 11/12/2007  . ARTHRITIS 11/12/2007  . BENIGN PROSTATIC HYPERTROPHY, HX OF 11/12/2007  . Edema 10/17/2010  . INGUINAL HERNIAS, BILATERAL 11/10/2006  . Obesity, unspecified 07/05/2009  . SLEEP APNEA 11/12/2007  . History of PTCA 2005  . Osteoarthritis   . Carotid stenosis, bilateral   . Nephrolithiasis   . thymus ca dx'd 2003    Radiation comp 2003  . NHL (non-Hodgkin's lymphoma) dx'd 2008    Chemo comp 10/2010; rituxin comp 10/2010  . 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 MANTLE CELL LYMPHOMA INTRA-ABDOMINAL LYMPH NODES 11/12/2007  . Tricuspid valve regurgitation     Past Surgical History  Procedure Laterality Date  . Coronary artery bypass graft    . Cardiac catheterization  08/2009, 02/2010  . Total hip arthroplasty Left 12/09/2013    Procedure: TOTAL HIP ARTHROPLASTY;  Surgeon: Mauri Pole, MD;  Location: [facility];  Service: Orthopedics;  Laterality: Left;  . Esophagogastroduodenoscopy N/A 08/02/2014    Procedure: ESOPHAGOGASTRODUODENOSCOPY (EGD) at bedside;  Surgeon: Irene Shipper, MD;  Location: [facility];  Service: Endoscopy;  Laterality: N/A;    Family History  Problem Relation Age of Onset  . Coronary artery disease Mother     died at 12  . Heart attack Sister     died at age 48  . Heart attack Brother    Social History:  reports that he has never smoked. He has never used smokeless tobacco. He reports that he does not drink alcohol or use illicit drugs.  Allergies:  Allergies  Allergen Reactions  . Atorvastatin Other (See Comments)  Incredible dizziness    Medications Prior to Admission  Medication Sig Dispense Refill  . acyclovir (ZOVIRAX) 400 MG tablet TAKE ONE TABLET BY MOUTH TWICE DAILY (Patient taking differently: Take 400 mg by mouth daily. For 6 months then stop) 60 tablet 2  . amLODipine (NORVASC) 5 MG tablet Take 1 tablet (5 mg total) by mouth daily. 30 tablet  11  . aspirin 81 MG tablet Take 81 mg by mouth daily.     Marland Kitchen atenolol (TENORMIN) 50 MG tablet Take 1 tablet (50 mg total) by mouth daily. 30 tablet 11  . citalopram (CELEXA) 20 MG tablet TAKE ONE TABLET BY MOUTH ONCE DAILY (Patient taking differently: Take 20 mg by mouth daily. ) 30 tablet 2  . clopidogrel (PLAVIX) 75 MG tablet Take 1 tablet (75 mg total) by mouth daily. 30 tablet 11  . furosemide (LASIX) 20 MG tablet Take one daily as needed for fluid (Patient taking differently: Take 20 mg by mouth as needed for fluid. ) 30 tablet 3  . HYDROcodone-acetaminophen (NORCO/VICODIN) 5-325 MG per tablet Take 1-2 tablets by mouth every 6 (six) hours as needed for moderate pain. 100 tablet 0  . isosorbide mononitrate (IMDUR) 120 MG 24 hr tablet Take 1 tablet (120 mg total) by mouth daily. 30 tablet 11  . lactose free nutrition (BOOST PLUS) LIQD Take 237 mLs by mouth 2 (two) times daily as needed (Allow w/pt or pt's wife request after diet advancement). 60 Can 0  . lidocaine-prilocaine (EMLA) cream Apply 1 application topically See admin instructions. Apply to port 1 hour before treatment. Cover site with plastic wrap 30 g 2  . methocarbamol (ROBAXIN) 500 MG tablet Take 1 tablet (500 mg total) by mouth every 6 (six) hours as needed for muscle spasms. 50 tablet 0  . nitroGLYCERIN (NITROSTAT) 0.4 MG SL tablet Place 1 tablet (0.4 mg total) under the tongue every 5 (five) minutes as needed for chest pain. 25 tablet prn  . pravastatin (PRAVACHOL) 80 MG tablet Take 1 tablet (80 mg total) by mouth daily. 30 tablet 11  . PRESCRIPTION MEDICATION Inject into the vein every 21 ( twenty-one) days. Velcade and Cytoxan.    . sulfamethoxazole-trimethoprim (BACTRIM DS,SEPTRA DS) 800-160 MG per tablet Take 1 tablet by mouth every Monday, Wednesday, and Friday. (Patient not taking: Reported on 08/02/2014) 24 tablet 3    ROS: Out of a complete 14 system review, the patient complains of only the following symptoms, and all  other reviewed systems are negative. +weakness, fatigue  MRI imaging reviewed and shows 2 posterior circulation punctate infarcts  Physical Examination: Filed Vitals:   08/09/14 0800  BP:   Pulse:   Temp: 98.1 F (36.7 C)  Resp:    Physical Exam  Constitutional: He appears well-developed and well-nourished.  Psych: Affect appropriate to situation Eyes: No scleral injection HENT: No OP obstrucion Head: Normocephalic.  Cardiovascular: Normal rate and regular rhythm.  Respiratory: Effort normal and breath sounds normal.  GI: Soft. Bowel sounds are normal. No distension. There is no tenderness.  Skin: WDI   Neurologic Examination: Mental Status: Alert, oriented, thought content appropriate.  Speech fluent without evidence of aphasia.  Able to follow 3 step commands without difficulty. Cranial Nerves: II: funduscopic exam wnl bilaterally, visual fields grossly normal, pupils equal, round, reactive to light and accommodation III,IV, VI: ptosis not present, extra-ocular motions intact bilaterally V,VII: smile symmetric, facial light touch sensation normal bilaterally VIII: hearing normal bilaterally IX,X: gag reflex present XI: trapezius strength/neck flexion  strength normal bilaterally XII: tongue strength normal  Motor: Right : Upper extremity    Left:     Upper extremity 5/5 deltoid       5/5 deltoid 5/5 biceps      5/5 biceps  5/5 triceps      5/5 triceps 5/5 hand grip      5/5 hand grip  Lower extremity     Lower extremity Proximal bilateral LE 4/5, distal 5/5 Sensory: Pinprick and light touch intact throughout, bilaterally Deep Tendon Reflexes: 2+ and symmetric throughout Plantars: Right: downgoing   Left: downgoing Cerebellar: normal finger-to-nose, normal rapid alternating movements and normal heel-to-shin test Gait: deferred due to multiple leads in place  Laboratory Studies:   Basic Metabolic Panel:  Recent Labs Lab 08/04/14 0405 08/05/14 2308  08/06/14 0400 08/06/14 1340 08/07/14 0512 08/09/14 0405  NA 144 142 142 138  --  142  K 3.1* 3.0* 3.2* 4.1  --  3.1*  CL 110 101 103 101  --  101  CO2 27 27 28 25   --  30  GLUCOSE 104* 115* 120* 253*  --  128*  BUN 17 9 10 11   --  20  CREATININE 1.09 0.93 0.99 0.98  --  1.43*  CALCIUM 7.9* 8.1* 8.2* 7.9*  --  8.0*  MG  --   --  1.7  --  2.2  --     Liver Function Tests:  Recent Labs Lab 08/09/14 0405  AST 27  ALT 10  ALKPHOS 73  BILITOT 0.2*  PROT 5.1*  ALBUMIN 2.1*   No results for input(s): LIPASE, AMYLASE in the last 168 hours. No results for input(s): AMMONIA in the last 168 hours.  CBC:  Recent Labs Lab 08/03/14 1600 08/05/14 0425 08/05/14 2308 08/06/14 0400 08/08/14 0600  WBC 7.7 7.3 9.6 11.2* 10.0  HGB 9.7* 9.5* 9.9* 9.9* 9.1*  HCT 29.7* 27.8* 29.7* 30.8* 28.6*  MCV 91.4 91.4 91.7 92.2 94.7  PLT 143* 131* 150 147* 152    Cardiac Enzymes: No results for input(s): CKTOTAL, CKMB, CKMBINDEX, TROPONINI in the last 168 hours.  BNP: Invalid input(s): POCBNP  CBG:  Recent Labs Lab 08/07/14 1305 08/07/14 1853 08/07/14 2312 08/07/14 2345 08/08/14 1851  GLUCAP 133* 187* 148* 135* 56*    Microbiology: Results for orders placed or performed during the hospital encounter of 08/02/14  MRSA PCR Screening     Status: None   Collection Time: 08/02/14  6:17 AM  Result Value Ref Range Status   MRSA by PCR NEGATIVE NEGATIVE Final    Comment:        The GeneXpert MRSA Assay (FDA approved for NASAL specimens only), is one component of a comprehensive MRSA colonization surveillance program. It is not intended to diagnose MRSA infection nor to guide or monitor treatment for MRSA infections.   Culture, blood (routine x 2)     Status: None (Preliminary result)   Collection Time: 08/07/14  7:09 PM  Result Value Ref Range Status   Specimen Description BLOOD L ARM  Final   Special Requests BOTTLES DRAWN AEROBIC AND ANAEROBIC 8CC  Final   Culture  Setup  Time   Final    08/08/2014 03:11 Performed at [facility]    Culture   Final           BLOOD CULTURE RECEIVED NO GROWTH TO DATE CULTURE WILL BE HELD FOR 5 DAYS BEFORE ISSUING A FINAL NEGATIVE REPORT Performed at [facility]  Report Status PENDING  Incomplete  Culture, blood (routine x 2)     Status: None (Preliminary result)   Collection Time: 08/07/14  7:20 PM  Result Value Ref Range Status   Specimen Description BLOOD R ARM  Final   Special Requests BOTTLES DRAWN AEROBIC AND ANAEROBIC 10CC  Final   Culture  Setup Time   Final    08/08/2014 03:11 Performed at [facility]    Culture   Final           BLOOD CULTURE RECEIVED NO GROWTH TO DATE CULTURE WILL BE HELD FOR 5 DAYS BEFORE ISSUING A FINAL NEGATIVE REPORT Performed at [facility]    Report Status PENDING  Incomplete  Urine culture     Status: None (Preliminary result)   Collection Time: 08/07/14 10:56 PM  Result Value Ref Range Status   Specimen Description URINE, RANDOM  Final   Special Requests NONE  Final   Culture  Setup Time   Final    08/08/2014 10:02 Performed at [place]   Final    >=100,000 COLONIES/ML Performed at [facility]    Culture   Final    ESCHERICHIA COLI Performed at [facility]    Report Status PENDING  Incomplete    Coagulation Studies: No results for input(s): LABPROT, INR in the last 72 hours.  Urinalysis:  Recent Labs Lab 08/07/14 2256  COLORURINE YELLOW  LABSPEC 1.017  PHURINE 5.5  GLUCOSEU NEGATIVE  HGBUR MODERATE*  BILIRUBINUR NEGATIVE  KETONESUR NEGATIVE  PROTEINUR 30*  UROBILINOGEN 1.0  NITRITE NEGATIVE  LEUKOCYTESUR LARGE*    Lipid Panel:     Component Value Date/Time   CHOL  03/22/2010 0225    167        ATP III CLASSIFICATION:  <200     mg/dL   Desirable  200-239  mg/dL   Borderline High  >=240    mg/dL   High          TRIG 139 03/22/2010 0225   HDL 39* 03/22/2010 0225    CHOLHDL 4.3 03/22/2010 0225   VLDL 28 03/22/2010 0225   LDLCALC * 03/22/2010 0225    100        Total Cholesterol/HDL:CHD Risk Coronary Heart Disease Risk Table                     Men   Women  1/2 Average Risk   3.4   3.3  Average Risk       5.0   4.4  2 X Average Risk   9.6   7.1  3 X Average Risk  23.4   11.0        Use the calculated Patient Ratio above and the CHD Risk Table to determine the patient's CHD Risk.        ATP III CLASSIFICATION (LDL):  <100     mg/dL   Optimal  100-129  mg/dL   Near or Above                    Optimal  130-159  mg/dL   Borderline  160-189  mg/dL   High  >190     mg/dL   Very High    HgbA1C:  Lab Results  Component Value Date   HGBA1C * 03/22/2010    6.4 (NOTE)  According to the ADA Clinical Practice Recommendations for 2011, when HbA1c is used as a screening test:   >=6.5%   Diagnostic of Diabetes Mellitus           (if abnormal result  is confirmed)  5.7-6.4%   Increased risk of developing Diabetes Mellitus  References:Diagnosis and Classification of Diabetes Mellitus,Diabetes [id number],6948,54(OEVOJ 1):S62-S69 and Standards of Medical Care in         Diabetes - 2011,Diabetes [id number],3818,29  (Suppl 1):S11-S61.    Urine Drug Screen:  No results found for: LABOPIA, COCAINSCRNUR, LABBENZ, AMPHETMU, THCU, LABBARB  Alcohol Level: No results for input(s): ETH in the last 168 hours.  Imaging: Mr Jeri Cos HB Contrast  08/08/2014   CLINICAL DATA:  Mantle cell lymphoma. Evaluate for brain metastases.  EXAM: MRI HEAD WITHOUT AND WITH CONTRAST  TECHNIQUE: Multiplanar, multiecho pulse sequences of the brain and surrounding structures were obtained without and with intravenous contrast.  CONTRAST:  36mL MULTIHANCE GADOBENATE DIMEGLUMINE 529 MG/ML IV SOLN  COMPARISON:  Head CT 08/02/2014  FINDINGS: There are several punctate foci of cortical of restricted diffusion in the right occipital lobe  without associated enhancement, consistent with acute infarcts. There is also a subcentimeter focus of acute infarction in the left cerebellum. There is no evidence of intracranial hemorrhage, mass, midline shift, or extra-axial fluid collection. There is moderate ventricular enlargement which is slightly out of proportion to the degree of sulcal enlargement, likely reflecting central predominant cerebral atrophy. Patchy and confluent T2 hyperintensities are present throughout the subcortical and deep cerebral white matter bilaterally with mild T2 hyperintensities in the brainstem, nonspecific but compatible with moderate chronic small vessel ischemic disease. A  Small, remote cortical/subcortical infarct is noted in the right frontal lobe. Remote subcortical right parietal infarct is noted, and there is also encephalomalacia in the right occipital lobe likely reflecting a chronic infarct. Small, chronic infarcts are also present in the corona radiata bilaterally and right cerebellum. No abnormal brain parenchymal or definite abnormal meningeal enhancement is identified.  There is asymmetric masslike soft tissue thickening in the left posterior nasopharynx measuring approximately 2.0 x 1.3 cm corresponding to abnormal FDG uptake on recent PET-CT. Prior right cataract extraction is noted. There is a small right mastoid effusion. Distal right vertebral artery flow void is not well seen and may be either hypoplastic or occluded. Other major intracranial vascular flow voids are preserved.  IMPRESSION: 1. Punctate, acute posterior circulation infarcts involving the left cerebellum and right occipital lobe. 2. No evidence of intracranial metastases. 3. Moderate chronic small vessel ischemic disease and cerebral atrophy. Chronic infarcts as above. 4. Asymmetric, nodular soft tissue in the left nasopharynx, concerning for malignancy, specifically involvement by patient's known lymphoma, as described on PET-CT.    Electronically Signed   By: Logan Bores   On: 08/08/2014 15:19   Dg Chest Port 1 View  08/08/2014   CLINICAL DATA:  Increased shortness of breath  EXAM: PORTABLE CHEST - 1 VIEW  COMPARISON:  08/07/2013  FINDINGS: Right IJ porta catheter remains in good position, tip at the SVC.  Unchanged cardiomegaly and aortic contours. CABG changes again noted. Near complete clearance of pulmonary edema, with mild perihilar congestion still present. No pleural effusion or pneumothorax.  IMPRESSION: Clearing pulmonary edema.   Electronically Signed   By: Jorje Guild M.D.   On: 08/08/2014 05:16   Dg Chest Port 1 View  08/07/2014   CLINICAL DATA:  Initial evaluation for fever  EXAM: PORTABLE CHEST - 1 VIEW  COMPARISON:  08/02/2014  FINDINGS: Stable mild cardiac enlargement. Moderate vascular congestion. Mild diffuse interstitial prominence.  Stable Port-A-Cath on the right. Status post removal of endotracheal tube and enteric tube. Mild bibasilar atelectasis. No significant pleural effusion.  IMPRESSION: Findings suggest congestive heart failure with mild interstitial pulmonary edema   Electronically Signed   By: Skipper Cliche M.D.   On: 08/07/2014 18:44    Assessment: 80 y.o. male past medical history of mantle cell lymphoma, coronary artery disease, peripheral vascular disease, dyslipidemia, chronic kidney disease stage II, anemia of chronic disease, sleep apnea presenting with acute GI bleed s/p coil embolization. MRI brain completed for question of metastatic disease show 2 posterior circulation punctate infarcts. Suspect these are likely incidental findings. Of note, his SBP did drop down to the 80s during his hospital course.  Stroke Risk Factors - CAD, PVD, dyslipidemia  Plan: 1. HgbA1c, fasting lipid panel 2. Check MRA brain 3. PT consult, OT consult, Speech consult 4. Echocardiogram 5. Carotid dopplers 6. Prophylactic therapy-hold all due to GI bleed 7. Risk factor modification 8. Telemetry  monitoring 9. Frequent neuro checks   Jim Like, DO [facility] [phone]  If 7pm- 7am, please page neurology on call as listed in [place]. 08/09/2014, 10:43 AM

## 2014-08-09 NOTE — Progress Notes (Signed)
PULMONARY / CRITICAL CARE MEDICINE   Name: Martin Morris MRN: [id number] DOB: 1932/12/06    ADMISSION DATE:  08/02/2014 CONSULTATION DATE:  12/9  REFERRING MD :  Charlies Silvers   CHIEF COMPLAINT:  Acute UGIB   INITIAL PRESENTATION:  80 year old male undergoing active chemo-therapy for Mantle cell lymphoma. Most recent PET scan demonstrated diffuse progression of disease (see study section below), most notably tumor in fundus of stomach. Admitted to [facility] on 12/8 w/ acute UGIB, treated w/ PPI gtt and blood x-fusion. PCCM asked to consult later that am due to bradycardia and syncopal event following episode of hematemesis.    STUDIES:  PET scan 12/2: most recent PET scan shows extensive hypermetabolic foci.. Including: area which likely represents tumor in the fundus of the stomach, descending colon and cecum . Also has hypermetabolism throughout the neck, chest and abdomen, as detailed above. Sites are highlyvaried and include posterior nasopharyngeal mucosa (left greater than right), pleura, left hilar lymph node, gastric mucosa, gastrohepatic ligament lymph node and colonic lesions CT head 12/8: Diffuse atrophy and small vessel ischemic changes. Early intracranial metastases are not excluded. No significant mass effect. No intracranial hemorrhage. MRI 12/14>>>neg for METS ECHO 12/15: There was mild focalbasal hypertrophy of the septum. The estimated ejection fraction was 55%. Doppler parameters are consistent with abnormal leftventricular relaxation (grade 1 diastolic dysfunction).- Mitral valve: Calcified annulus. There was mild regurgitation. - Left atrium: The atrium was moderately dilated. - Atrial septum: No defect or patent foramen ovale was identified. - Tricuspid valve: There was mild-moderate regurgitation. - Pulmonary arteries: PA peak pressure: 44 mm Hg (S).  SIGNIFICANT EVENTS: 12/8 EGD-malignant gastric ulcer - clipping 12/9 extubated 12/11 s/p embolization and coiling 12/13  spiked temp; increased SOB, transferred back to SDU, placed on BIPAP and given lasix-->better 12/14: PCCM asked to eval again   SUBJECTIVE:  No distress.  VITAL SIGNS: Temp:  [98.1 F (36.7 C)-98.9 F (37.2 C)] 98.1 F (36.7 C) (12/15 0800) Pulse Rate:  [77-103] 90 (12/15 0700) Resp:  [13-22] 20 (12/15 0700) BP: (87-133)/(48-73) 126/73 mmHg (12/15 0700) SpO2:  [93 %-100 %] 97 % (12/15 0700) Weight:  [92.4 kg (203 lb 11.3 oz)] 92.4 kg (203 lb 11.3 oz) (12/15 0400)  Room air  HEMODYNAMICS:   VENTILATOR SETTINGS:   INTAKE / OUTPUT:  Intake/Output Summary (Last 24 hours) at 08/09/14 1012 Last data filed at 08/09/14 0700  Gross per 24 hour  Intake    120 ml  Output   2150 ml  Net  -2030 ml    PHYSICAL EXAMINATION: General:  80 year old male,  pale  Neuro:  Awake, oriented  HEENT:  Awake, alert, no focal def  Cardiovascular:  rrr Lungs: clear, no accessory muscle use  Abdomen:  Soft, non-tender + bowel sound  Musculoskeletal:  weak Skin:  Bilateral LE edema w/ chronic venous stasis changes   LABS:  CBC  Recent Labs Lab 08/05/14 2308 08/06/14 0400 08/08/14 0600  WBC 9.6 11.2* 10.0  HGB 9.9* 9.9* 9.1*  HCT 29.7* 30.8* 28.6*  PLT 150 147* 152   Coag's  Recent Labs Lab 08/03/14 1444  INR 1.23   BMET  Recent Labs Lab 08/06/14 0400 08/06/14 1340 08/09/14 0405  NA 142 138 142  K 3.2* 4.1 3.1*  CL 103 101 101  CO2 28 25 30   BUN 10 11 20   CREATININE 0.99 0.98 1.43*  GLUCOSE 120* 253* 128*   Electrolytes  Recent Labs Lab 08/06/14 0400 08/06/14 1340 08/07/14  6195 08/09/14 0405  CALCIUM 8.2* 7.9*  --  8.0*  MG 1.7  --  2.2  --    Sepsis Markers No results for input(s): LATICACIDVEN, PROCALCITON, O2SATVEN in the last 168 hours. ABG  Recent Labs Lab 08/02/14 1055 08/08/14 0555  PHART 7.365 7.448  PCO2ART 41.3 36.6  PO2ART 325.0* 68.1*   Liver Enzymes  Recent Labs Lab 08/09/14 0405  AST 27  ALT 10  ALKPHOS 73  BILITOT 0.2*   ALBUMIN 2.1*   Cardiac Enzymes No results for input(s): TROPONINI, PROBNP in the last 168 hours. Glucose  Recent Labs Lab 08/07/14 0605 08/07/14 1305 08/07/14 1853 08/07/14 2312 08/07/14 2345 08/08/14 1851  GLUCAP 123* 133* 187* 148* 135* 122*    Imaging Mr Brain W Wo Contrast  08/08/2014   CLINICAL DATA:  Mantle cell lymphoma. Evaluate for brain metastases.  EXAM: MRI HEAD WITHOUT AND WITH CONTRAST  TECHNIQUE: Multiplanar, multiecho pulse sequences of the brain and surrounding structures were obtained without and with intravenous contrast.  CONTRAST:  14mL MULTIHANCE GADOBENATE DIMEGLUMINE 529 MG/ML IV SOLN  COMPARISON:  Head CT 08/02/2014  FINDINGS: There are several punctate foci of cortical of restricted diffusion in the right occipital lobe without associated enhancement, consistent with acute infarcts. There is also a subcentimeter focus of acute infarction in the left cerebellum. There is no evidence of intracranial hemorrhage, mass, midline shift, or extra-axial fluid collection. There is moderate ventricular enlargement which is slightly out of proportion to the degree of sulcal enlargement, likely reflecting central predominant cerebral atrophy. Patchy and confluent T2 hyperintensities are present throughout the subcortical and deep cerebral white matter bilaterally with mild T2 hyperintensities in the brainstem, nonspecific but compatible with moderate chronic small vessel ischemic disease. A  Small, remote cortical/subcortical infarct is noted in the right frontal lobe. Remote subcortical right parietal infarct is noted, and there is also encephalomalacia in the right occipital lobe likely reflecting a chronic infarct. Small, chronic infarcts are also present in the corona radiata bilaterally and right cerebellum. No abnormal brain parenchymal or definite abnormal meningeal enhancement is identified.  There is asymmetric masslike soft tissue thickening in the left posterior  nasopharynx measuring approximately 2.0 x 1.3 cm corresponding to abnormal FDG uptake on recent PET-CT. Prior right cataract extraction is noted. There is a small right mastoid effusion. Distal right vertebral artery flow void is not well seen and may be either hypoplastic or occluded. Other major intracranial vascular flow voids are preserved.  IMPRESSION: 1. Punctate, acute posterior circulation infarcts involving the left cerebellum and right occipital lobe. 2. No evidence of intracranial metastases. 3. Moderate chronic small vessel ischemic disease and cerebral atrophy. Chronic infarcts as above. 4. Asymmetric, nodular soft tissue in the left nasopharynx, concerning for malignancy, specifically involvement by patient's known lymphoma, as described on PET-CT.   Electronically Signed   By: Logan Bores   On: 08/08/2014 15:19   Dg Chest Port 1 View  08/08/2014   CLINICAL DATA:  Increased shortness of breath  EXAM: PORTABLE CHEST - 1 VIEW  COMPARISON:  08/07/2013  FINDINGS: Right IJ porta catheter remains in good position, tip at the SVC.  Unchanged cardiomegaly and aortic contours. CABG changes again noted. Near complete clearance of pulmonary edema, with mild perihilar congestion still present. No pleural effusion or pneumothorax.  IMPRESSION: Clearing pulmonary edema.   Electronically Signed   By: Jorje Guild M.D.   On: 08/08/2014 05:16  Right > left airspace disease. Has improved since lasix on 12/13.  Suspect edema  ASSESSMENT / PLAN:  PULMONARY OETT 12/8>> 12/9 A:  r/o OSA Acute resp failure 12/13 in setting of pulmonary edema. Improved w/ lasix and BIPAP. Resolved  P:   Cont lasix as BP and cr allow  Wean FIO2 Mobilize  Dc BIPAP  Will set her up w/ out pt sleep   CARDIOVASCULAR CVL: right port  A:  Symptomatic bradycardia in setting of syncopal event Hemorrhagic shock -resolved Pulmonary edema 01/75 Grade I diastolic dyfxn w/ focal basal hypertrophy  P:  Add back atenolol   Hold ASA & plavix  RENAL A:   CKD stage II w/ acute on chronic AKI (baseline scr 1.4 range).  Scr bumped after lasix  Hypokalemia P:   Avoid hypotension Hold antihypertensives Strict I&O Hold lasix today (12/15), reassess 12/16  GASTROINTESTINAL A:   Acute UGIB. 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 The patient may need repeat biopsy. If repeat biopsy confirmed recurrence of mantle cell lymphoma, the current guidelines suggested treatment with Ibrutinib. Possible palliative xrt to malignant tumor when stable    INFECTIOUS A:  Fever, chills 12/13 P:   Trend fever curve UC 12/13>>> BC 12/13>>> Rocephin 12/13>>>  ENDOCRINE A:  Mild hyperglycemia  P:   ssi protocol   NEUROLOGIC A:   Transient acute encephalopathy (due to syncopal event w/ GIB)-->resolved.  MRI brain w/out evidence of METS P:   RASS goal: 0 Supportive care  Hold all sedating meds    FAMILY  - Updates: family updated12/10  - Inter-disciplinary family meet or Palliative Care meeting due by:  12/13  NP SUMMARY STATEMENT No distress. Seems to have responded favorably to lasix. Suspect that this was d/t pulmonary edema. Has gd I d-dysfxn. Had long discussion w/ both the pt and his wife Pam at the bedside. At this point he is full Code, and would want short term aggressive care including mechanical ventilation, IV hydration, pressors and ACLS as needed. However he does have a living will. He does not want prolonged mechanical ventilation or trach. He would also be  agreeable to discontinuation of care if he gets to a point where interventions are no longer helping. Have encouraged he and his wife to continue to keep a open dialog about his wishes. We will s/o     Erick Colace ACNP-BC [place] Pager # [phone] OR # [phone] if no answer      08/09/2014, 10:12 AM

## 2014-08-09 NOTE — Progress Notes (Signed)
    Progress Note   Subjective  No complaints   Objective   Vital signs in last 24 hours: Temp:  [98.1 F (36.7 C)-98.9 F (37.2 C)] 98.6 F (37 C) (12/15 1200) Pulse Rate:  [90-103] 94 (12/15 1200) Resp:  [17-25] 20 (12/15 1200) BP: (106-139)/(57-78) 136/78 mmHg (12/15 1200) SpO2:  [93 %-100 %] 96 % (12/15 1200) Weight:  [203 lb 11.3 oz (92.4 kg)] 203 lb 11.3 oz (92.4 kg) (12/15 0400) Last BM Date: 08/08/14 General:    White male in NAD Heart:  Regular rate and rhythm Lungs: Respirations even and unlabored Abdomen:  Soft, nontender and nondistended.  Extremities:  Without edema. Neurologic:  Alert and oriented,  grossly normal neurologically. Psych:  Cooperative. Normal mood and affect.  Lab Results:  Recent Labs  08/08/14 0600  WBC 10.0  HGB 9.1*  HCT 28.6*  PLT 152   BMET  Recent Labs  08/09/14 0405  NA 142  K 3.1*  CL 101  CO2 30  GLUCOSE 128*  BUN 20  CREATININE 1.43*  CALCIUM 8.0*   LFT  Recent Labs  08/09/14 0405  PROT 5.1*  ALBUMIN 2.1*  AST 27  ALT 10  ALKPHOS 73  BILITOT 0.2*      Assessment / Plan:     80 year old male with mantle cell lymphoma. He had a major GI bleed secondary to gastric mass requiring endoclipping 08/02/14. Patient ultimately required angio /embolization by IR. No further bleeding. Mass wasn't biopsied at time of EGD (not done in setting of active bleeding). Oncology wants tissue diagnosis to proceed with treatment. Patient agreeable to repeat EGD with biopsy in am.      LOS: 7 days   Tye Savoy  08/09/2014, 4:08 PM   ________________________________________________________________________  Velora Heckler GI MD note:  I personally examined the patient, reviewed the data and agree with the assessment and plan described above.  Discussed with patient and Dr. Alvy Bimler repeat EGD for biopsy of gastric mass. Still will be risk of bleeding after biopsy but given IR embolization last week I think it is unlikely to be  significant bleeding.     Owens Loffler, MD [facility] Pager [phone]

## 2014-08-09 NOTE — Progress Notes (Signed)
Physical Therapy Treatment Patient Details Name: Martin Morris MRN: [id number] DOB: 1933-01-22 Today's Date: 08/09/2014    History of Present Illness 80 yo male admitted with hemorrhagic shock. s/p mesenteric arteriogram, coil 08/05/14 by IR. MRI of the brain was done to evaluate the possibility of intracranial metastases seen on CT head. 08/08/14--MRI did not reveal metastatic disease in the brain but it did show acute posterior circulation infarct in the left cerebellum and right occipital lobe. Hx of L THA, metastatic lymphoma, HTN, chronic LE edema, BPH, PVD    PT Comments     Pt tolerated session well. Requiring Min -Mod assist for mobility. Discussed d/c plan with pt/wife-open to CIR consult to see if pt is a candidate.   Follow Up Recommendations  CIR;Supervision/Assistance - 24 hour (if CIR not an option, then family prefers home with HHPT)     Equipment Recommendations  Rolling walker with 5" wheels    Recommendations for Other Services OT consult;Rehab consult     Precautions / Restrictions Precautions Precautions: Fall Restrictions Weight Bearing Restrictions: No    Mobility  Bed Mobility Overal bed mobility: Needs Assistance Bed Mobility: Supine to Sit     Supine to sit: Mod assist;HOB elevated     General bed mobility comments: Assist for LEs and trunk to upright (most assist for trunk). Increased time.   Transfers Overall transfer level: Needs assistance Equipment used: Rolling walker (2 wheeled) Transfers: Sit to/from Stand Sit to Stand: Min assist;From elevated surface         General transfer comment: Assist to rise, stabilize, control descent. VCs safety, technique, hand placement  Ambulation/Gait Ambulation/Gait assistance: Min assist Ambulation Distance (Feet): 60 Feet Assistive device: Rolling walker (2 wheeled) Gait Pattern/deviations: Step-through pattern;Trunk flexed;Decreased stride length;Decreased step length - left;Decreased step  length - right     General Gait Details: Assist to stabilize throughout ambulation. VCS safety, posture, distance from walker. Fatigues fairly easily.    Stairs            Wheelchair Mobility    Modified Rankin (Stroke Patients Only)       Balance Overall balance assessment: Needs assistance Sitting-balance support: Bilateral upper extremity supported;Feet supported Sitting balance-Leahy Scale: Good     Standing balance support: Bilateral upper extremity supported;During functional activity Standing balance-Leahy Scale: Poor                      Cognition Arousal/Alertness: Awake/alert Behavior During Therapy: Flat affect Overall Cognitive Status: Within Functional Limits for tasks assessed (slow to process/respond at times)                      Exercises      General Comments        Pertinent Vitals/Pain Pain Assessment: No/denies pain    Home Living                      Prior Function            PT Goals (current goals can now be found in the care plan section) Progress towards PT goals: Progressing toward goals    Frequency  Min 3X/week    PT Plan Current plan remains appropriate    Co-evaluation             End of Session Equipment Utilized During Treatment: Gait belt Activity Tolerance: Patient tolerated treatment well Patient left: in chair;with call bell/phone within reach;with family/visitor present  Time: 1226-1258 PT Time Calculation (min) (ACUTE ONLY): 32 min  Charges:  $Gait Training: 8-22 mins $Therapeutic Activity: 8-22 mins                    G Codes:      Weston Anna, MPT Pager: [phone]

## 2014-08-09 NOTE — Progress Notes (Signed)
Martin Morris   DOB:May 16, 1933   XH#:[id number]   FYB#:[id number]  Patient Care Team: Dione Housekeeper, MD as PCP - General (Family Medicine) Heath Lark, MD as Consulting Physician (Hematology and Oncology) I have seen the patient, examined him and edited the notes as follows I have seen the patient, examined him and edited the notes as follows  Subjective: Patient seen and examined. No new events overnight. No bleeding issues reported. Stools without blood. He has intermittent periods of dyspnea, Improved with respiratory therapy. Denies Chest pain. He denies abdominal pain, nausea or vomiting. He denies diarrhea. He is less conversant than prior visits, more somnolent. Denies headaches or vision problems at this time. MRI show minor stroke. The patient denies new neurological deficit   Scheduled Meds: . antiseptic oral rinse  7 mL Mouth Rinse BID  . cefTRIAXone (ROCEPHIN)  IV  1 g Intravenous QHS  . pantoprazole (PROTONIX) IV  40 mg Intravenous Q12H   Continuous Infusions: . sodium chloride 50 mL/hr at 08/08/14 0137   PRN Meds:acetaminophen **OR** acetaminophen, hydrALAZINE, HYDROcodone-acetaminophen, nitroGLYCERIN, ondansetron **OR** ondansetron (ZOFRAN) IV   Objective:  Filed Vitals:   08/09/14 0700  BP: 126/73  Pulse: 90  Temp:   Resp: 20      Intake/Output Summary (Last 24 hours) at 08/09/14 0753 Last data filed at 08/09/14 0700  Gross per 24 hour  Intake    150 ml  Output   3650 ml  Net  -3500 ml    ECOG PERFORMANCE STATUS: 2-3  GENERAL:alert, no distress and ill appearing SKIN: skin color, texture, turgor are normal, no rashes or significant lesions. Chronic venous stasis changes EYES: normal, conjunctiva are pink and non-injected, sclera clear OROPHARYNX:no exudate, no erythema and lips, buccal mucosa, and tongue normal  NECK: supple, thyroid normal size, non-tender, without nodularity LYMPH: no palpable lymphadenopathy in the cervical, axillary or  inguinal LUNGS:trace of rhonchi and rales anteriorly without wheeze, with normal breathing effort HEART: regular rate & rhythm and no murmurs and bilateral lower extremity edema with venous stasis changes ABDOMEN:abdomen soft, non-tender and normal bowel sounds Musculoskeletal:no cyanosis of digits and no clubbing  PSYCH: alert & oriented x 3 with fluent speech NEURO: no focal motor/sensory deficits     CBG (last 3)   Recent Labs  08/07/14 2312 08/07/14 2345 08/08/14 1851  GLUCAP 148* 135* 122*     Labs:   Recent Labs Lab 08/03/14 1600 08/05/14 0425 08/05/14 2308 08/06/14 0400 08/08/14 0600  WBC 7.7 7.3 9.6 11.2* 10.0  HGB 9.7* 9.5* 9.9* 9.9* 9.1*  HCT 29.7* 27.8* 29.7* 30.8* 28.6*  PLT 143* 131* 150 147* 152  MCV 91.4 91.4 91.7 92.2 94.7  MCH 29.8 31.3 30.6 29.6 30.1  MCHC 32.7 34.2 33.3 32.1 31.8  RDW 19.2* 17.5* 17.2* 17.2* 17.2*     Chemistries:    Recent Labs Lab 08/04/14 0405 08/05/14 2308 08/06/14 0400 08/06/14 1340 08/07/14 0512 08/09/14 0405  NA 144 142 142 138  --  142  K 3.1* 3.0* 3.2* 4.1  --  3.1*  CL 110 101 103 101  --  101  CO2 27 27 28 25   --  30  GLUCOSE 104* 115* 120* 253*  --  128*  BUN 17 9 10 11   --  20  CREATININE 1.09 0.93 0.99 0.98  --  1.43*  CALCIUM 7.9* 8.1* 8.2* 7.9*  --  8.0*  MG  --   --  1.7  --  2.2  --   AST  --   --   --   --   --  27  ALT  --   --   --   --   --  10  ALKPHOS  --   --   --   --   --  73  BILITOT  --   --   --   --   --  0.2*    GFR Estimated Creatinine Clearance: 46.2 mL/min (by C-G formula based on Cr of 1.43).  Liver Function Tests:  Recent Labs Lab 08/09/14 0405  AST 27  ALT 10  ALKPHOS 73  BILITOT 0.2*  PROT 5.1*  ALBUMIN 2.1*    Coagulation profile  Recent Labs Lab 08/03/14 1444  INR 1.23    Cardiac Enzymes: No results for input(s): CKTOTAL, CKMB, CKMBINDEX, TROPONINI in the last 168 hours. BNP: Invalid input(s): POCBNP CBG:  Recent Labs Lab  08/07/14 1305 08/07/14 1853 08/07/14 2312 08/07/14 2345 08/08/14 1851  GLUCAP 133* 187* 148* 135* 122*    Imaging Studies:  Mr Kizzie Fantasia Contrast  08/08/2014   CLINICAL DATA:  Mantle cell lymphoma. Evaluate for brain metastases.  EXAM: MRI HEAD WITHOUT AND WITH CONTRAST  TECHNIQUE: Multiplanar, multiecho pulse sequences of the brain and surrounding structures were obtained without and with intravenous contrast.  CONTRAST:  66mL MULTIHANCE GADOBENATE DIMEGLUMINE 529 MG/ML IV SOLN  COMPARISON:  Head CT 08/02/2014  FINDINGS: There are several punctate foci of cortical of restricted diffusion in the right occipital lobe without associated enhancement, consistent with acute infarcts. There is also a subcentimeter focus of acute infarction in the left cerebellum. There is no evidence of intracranial hemorrhage, mass, midline shift, or extra-axial fluid collection. There is moderate ventricular enlargement which is slightly out of proportion to the degree of sulcal enlargement, likely reflecting central predominant cerebral atrophy. Patchy and confluent T2 hyperintensities are present throughout the subcortical and deep cerebral white matter bilaterally with mild T2 hyperintensities in the brainstem, nonspecific but compatible with moderate chronic small vessel ischemic disease. A  Small, remote cortical/subcortical infarct is noted in the right frontal lobe. Remote subcortical right parietal infarct is noted, and there is also encephalomalacia in the right occipital lobe likely reflecting a chronic infarct. Small, chronic infarcts are also present in the corona radiata bilaterally and right cerebellum. No abnormal brain parenchymal or definite abnormal meningeal enhancement is identified.  There is asymmetric masslike soft tissue thickening in the left posterior nasopharynx measuring approximately 2.0 x 1.3 cm corresponding to abnormal FDG uptake on recent PET-CT. Prior right cataract extraction is noted.  There is a small right mastoid effusion. Distal right vertebral artery flow void is not well seen and may be either hypoplastic or occluded. Other major intracranial vascular flow voids are preserved.  IMPRESSION: 1. Punctate, acute posterior circulation infarcts involving the left cerebellum and right occipital lobe. 2. No evidence of intracranial metastases. 3. Moderate chronic small vessel ischemic disease and cerebral atrophy. Chronic infarcts as above. 4. Asymmetric, nodular soft tissue in the left nasopharynx, concerning for malignancy, specifically involvement by patient's known lymphoma, as described on PET-CT.   Electronically Signed   By: Logan Bores   On: 08/08/2014 15:19   Dg Chest Port 1 View  08/08/2014   CLINICAL DATA:  Increased shortness of breath  EXAM: PORTABLE CHEST - 1 VIEW  COMPARISON:  08/07/2013  FINDINGS: Right IJ porta catheter remains in good position, tip at the SVC.  Unchanged cardiomegaly and aortic contours. CABG changes again noted. Near complete clearance of pulmonary edema, with mild perihilar congestion still present. No  pleural effusion or pneumothorax.  IMPRESSION: Clearing pulmonary edema.   Electronically Signed   By: Jorje Guild M.D.   On: 08/08/2014 05:16   Dg Chest Port 1 View  08/07/2014   CLINICAL DATA:  Initial evaluation for fever  EXAM: PORTABLE CHEST - 1 VIEW  COMPARISON:  08/02/2014  FINDINGS: Stable mild cardiac enlargement. Moderate vascular congestion. Mild diffuse interstitial prominence.  Stable Port-A-Cath on the right. Status post removal of endotracheal tube and enteric tube. Mild bibasilar atelectasis. No significant pleural effusion.  IMPRESSION: Findings suggest congestive heart failure with mild interstitial pulmonary edema   Electronically Signed   By: Skipper Cliche M.D.   On: 08/07/2014 18:44    Assessment/Plan: 80 y.o.  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 He receives Rituxan every alternate cycle. Last chemotherapy was given on 07/11/14, cycle #81. The pattern of recurrence is highly unusual. PET scan on 12/2 shows multifocal hypermetabolism throughout the neck, chest and abdomen. I reviewed his case at the hematology tumor board and overall consensus is to proceed with either biopsy of the stomach mass or cecal mass. I have spoken with Dr. Henrene Pastor and Dr. Ardis Hughs. Dr. Ardis Hughs would probably plan on doing EGD and biopsy over the next 2 days. If repeat biopsy confirmed recurrence of mantle cell lymphoma, the current guidelines suggested treatment with Ibrutinib. His scheduled chemo for 12/7 was cancelled until further notice  Anemia In the setting of acute GI bleed, chronic disease and chronic kidney disease Hb on admission was 7.4 requiring 2 units of blood with good response. He underwent Upper Endoscopy on 12/8, with visualization of a malignant ulcer was seen and clipped. This was followed by mesenteric angiogram with coil embolization of the right gastroepiploic artery 08/05/2014. Status was complicated by respiratory distress and UTI requiring admission to the stepdown unit. Dr. Tammi Klippel recommends outpatient radiation therapy to the lymphoma deposit to help with the malignant bleeding ulcer, as he is critically ill.  Continue to transfuse to maintain a Hb above 7, or to 8 if patient remains symptomatic. To date, he received 4 units of blood Appreciate GI and PCCM involvement.  Thrombocytopenia, mild Due to acute bleed, dilution, malignancy and polypharmacy He received 1 unit of platelets on 12/8 due to bleeding Avoid Heparin products. Aspirin and Plavix are on hold. No transfusion is indicated at this time as bleeding resolved  Chronic kidney disease (CKD), stage II (mild) This is chronic in nature, unlikely related to his cancer. Recommend close observation. Baseline is 1.4  Headaches with abnormal head imaging with  possible stroke The findings on his CT scan are not too impressive. MRI of the brain showed no evidence of intracranial metastases Asymmetric, nodular soft tissue in the left nasopharynx, concerning for malignancy, observed in PET scan, is seen again, measuring 2x1.3 cm  antiplatelet agents is contraindicated due to recent GI bleed   Acute Respiratory Failure Likely secondary to pulmonary edema Improved with Lasix and Bipap as per Critical Care Team  Full Code I readdressed CODE STATUS and the patient wants to remain in full code  Other medical issues including Symptomatic bradycardia in setting of syncopal event, Obstructive sleep apnea, Urinary tract infection, Hypokalemia and Hypertension as per admitting team and [place] E, PA-C 08/09/2014  7:53 AM  [place], Kiyla Ringler, MD 08/09/2014

## 2014-08-09 NOTE — Progress Notes (Addendum)
Patient ID: Martin Morris, male   DOB: 10-10-1932, 80 y.o.   MRN: [id number] [facility] PROGRESS NOTE  Martin Morris DXI:[id number] DOB: [birth year] DOA: 08/02/2014 PCP: [name], MD  Brief narrative:    80 year old male with past medical history of mantle cell lymphoma (diagnosed in 10/2006) undergoing active chemotherapy (under Dr. Calton Dach care), most recently has had PET scan 07/27/14 which showed diffuse progression of the disease most obvious in fundus of stomach, as well as descending colon and cecum. Patient also has history of coronary artery disease, the referral vascular disease, dyslipidemia, chronic kidney disease stage II, anemia of chronic disease, sleep apnea.  Patient presented [facility] 08/02/2014 with acute GI bleed. Patient was evaluated by GI. He was intubated for EGD which revealed malignant gastric ulcer status post endoclipping and resolution of bleeding. Please note patient received PPI gtt and blood transfusion as well. PCCM consulted for syncope and bradycardia following hematemesis. Additionally, IR consulted for elective embolization. Patinet is status post mesenteric angiogram with coil embolization of the right gastroepiploic artery 08/05/2014.   Radiation oncology has seen the patient in consultation for input on palliative radiotherapy to lymphoma. Due to acute illness the recommendation is for outpatient follow-up.  Additionally, MRI of the brain was done to evaluate the possibility of intracranial metastases seen on CT head. MRI did not reveal metastatic disease in the brain but it did show acute posterior circulation infarct in the left cerebellum and right occipital lobe. Neurology was consulted for input on management. Patient is not on aspirin and Plavix because of risk of bleeding.  Off note, on 08/07/2014 patient went into respiratory distress, was lethargic and had a fever. Fever workup obtained. He required BiPAP to keep oxygen saturations  above 90% and was subsequently transfered to stepdown unit. He was found to have clearing pulmonary edema. He was given a small bolus of Lasix IV. He was started on Rocephin for urinary tract infection which was evident on urinalysis. Blood cultures to date are negative. Urine culture is pending at this time. Critical care medicine was consulted the following morning on 08/08/2014 to be on standby should patient require intubation. Patient is currently feeling better and did not require BiPAP in past 24 hours.    Assessment/Plan:     Prncipal Problem: Acute hemorrhagic shock / upper GI bleed / acute blood loss anemia  - Secondary to upper GI bleed secondary to malignant proximal gastric ulcer as seen on EGD 08/02/2014  - Patient is status post Endo Clip 3. Also, status post mesenteric arteriogram with coil embolization of the right gastroepiploic artery done by IR 08/05/2014. - Patient status post 4 unit packed red blood cells and 1 unit platelets(08/02/14) transfusion.  - Plavix and Asprin still on hold due to risk of bleeding. - Continue protonix 40 mg IV Q 12 hours  - Patient continues to be hemodynamically stable with no evidence of bleeding. Patient is tolerating soft diet.  Active Problems: Acute respiratory failure with hypoxia / pulmonary edema - Chest x-ray on 08/08/2014 showed clearing of pulmonary edema. Patient received 1 dose of Lasix overnight. He was moved to stepdown unit because he required BiPAP. - Critical care medicine consulted on 08/08/2014 should patient require intubation. Fortunately, he started to improve and his respiratory status was stable in past 24 hours. He did not require BiPAP. - We will transfer to telemetry unit today. Order placed.  Urinary tract infection - Patient febrile overnight 08/07/2014. Urinalysis with evidence of infection.  Patient started on Rocephin. - Follow up urine culture results.  Malignant proximal gastric ulcer - Status post Endo  Clip 3 08/02/2014. Status post mesenteric arteriogram with coil embolization right gastroepiploic artery per interventional radiology 08/05/2014.  - Radiation oncology has been consulted and saw patient 08/03/2014 for possible radiation therapy. Rad oncology re-evaluated 08/08/2014 for palliative gastric radiation. This will likely take place outpatient due to multiple acute medical issues at this time.  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 He receives Rituxan every alternate cycle. - Last chemotherapy was given on 07/11/14, cycle #81 - CT of the head showed possible early metastatic disease. - MRI brain with contrast did not show evidence of intracranial metastasis.  Acute CVA - MRI of the brain done 08/08/2014 with findings of punctate, acute posterior circulation infarct involving the left cerebellum and right occipital lobe. Patient is off of aspirin and Plavix due to risk of bleeding. - Appreciate neurology consult and recommendations.  Thrombocytopenia, mild - Due to acute bleed, malignancy  - Patient received 1 unit of platelets on 12/8 due to bleeding  Chronic kidney disease (CKD), stage II (mild) - This is chronic in nature, unlikely related to his cancer.  - Baseline is 1.4. Creatinine normalized over a period of time but then this morning a bump to 1.43 noted which is likely because of Lasix given for pulmonary edema.  Hypokalemia - Likely secondary to recent Lasix given for pulmonary edema on 08/07/2014 - Supplemented. Follow-up BMP in the morning.   Symptomatic bradycardia in the setting of syncopal event - Likely secondary to hemorrhagic shock. Antihypertensives on hold. Aspirin and Plavix on hold.  - Resolved.   Acute encephalopathy - Likely secondary to syncopal event secondary to hemorrhagic shock - Mental status at baseline, alert and oriented to time, place and  person  Essential hypertension - has had hypotension so antihypertensives were on hold. May continue as needed hydralazine    DVT Prophylaxis  - aspirin and plavix on hold due to risk of bleeding  - SCD's bilaterally   Code Status: Full.  Family Communication: Family at the bedside this am Disposition Plan: Remains inpatient; transfer to telemetry floor today    IV access:   PAC  Procedures and diagnostic studies:     Mr Kizzie Fantasia Contrast 08/08/2014  1. Punctate, acute posterior circulation infarcts involving the left cerebellum and right occipital lobe. 2. No evidence of intracranial metastases. 3. Moderate chronic small vessel ischemic disease and cerebral atrophy. Chronic infarcts as above. 4. Asymmetric, nodular soft tissue in the left nasopharynx, concerning for malignancy, specifically involvement by patient's known lymphoma, as described on PET-CT.   Electronically Signed   By: Logan Bores   On: 08/08/2014 15:19   Ir Angiogram Visceral Selective 08/06/2014    1. Technically successful coil embolization of the distal right gastroepiploic artery at the level of the endoscopic clips along the greater curvature of the stomach, site of known bleeding lesion.   Electronically Signed   By: Arne Cleveland M.D.   On: 08/06/2014 09:17   Ir Angiogram Visceral Selective 08/06/2014    1. Technically successful coil embolization of the distal right gastroepiploic artery at the level of the endoscopic clips along the greater curvature of the stomach, site of known bleeding lesion.   Electronically Signed   By: Arne Cleveland M.D.   On: 08/06/2014 09:17   Ir Angiogram Visceral Selective 08/06/2014  1. Technically successful coil embolization of the distal right gastroepiploic artery at the level of the endoscopic clips along the greater curvature of the stomach, site of known bleeding lesion.   Electronically Signed   By: Arne Cleveland M.D.   On: 08/06/2014 09:17   Ir Angiogram  Selective Each Additional Vessel 08/06/2014  1. Technically successful coil embolization of the distal right gastroepiploic artery at the level of the endoscopic clips along the greater curvature of the stomach, site of known bleeding lesion.   Electronically Signed   By: Arne Cleveland M.D.   On: 08/06/2014 09:17   Ir Angiogram Selective Each Additional Vessel 08/06/2014  1. Technically successful coil embolization of the distal right gastroepiploic artery at the level of the endoscopic clips along the greater curvature of the stomach, site of known bleeding lesion.   Electronically Signed   By: Arne Cleveland M.D.   On: 08/06/2014 09:17   Ir US Guide Vasc Access Right 08/06/2014  1. Technically successful coil embolization of the distal right gastroepiploic artery at the level of the endoscopic clips along the greater curvature of the stomach, site of known bleeding lesion.   Electronically Signed   By: Arne Cleveland M.D.   On: 08/06/2014 09:17   Dg Chest Port 1 View 08/08/2014  Clearing pulmonary edema.   Electronically Signed   By: Jorje Guild M.D.   On: 08/08/2014 05:16   Dg Chest Port 1 View 08/07/2014    Findings suggest congestive heart failure with mild interstitial pulmonary edema   Electronically Signed   By: Skipper Cliche M.D.   On: 08/07/2014 18:44   Ir Embo Arterial Not Corrigan 08/06/2014  1. Technically successful coil embolization of the distal right gastroepiploic artery at the level of the endoscopic clips along the greater curvature of the stomach, site of known bleeding lesion.     Upper endoscopy 08/02/2014-DrHenrene Pastor  Intubation 08/02/2014>>> extubation 08/03/2014  Medical Consultants:   PCCM Dr. Elsworth Soho 08/02/2014; 08/08/2014  Gastroenterology: Dr. Henrene Pastor 08/02/2014  Oncology: Dr. Alvy Bimler 08/03/2014  Interventional radiology: Dr. Anselm Pancoast 08/04/2014  Radiation/ oncology: Dr. Tammi Klippel 08/03/2014  Other Consultants:   Physical therapy    IAnti-Infectives:    Rocephin 08/07/2014 -->   Leisa Lenz, MD  [facility] Pager [phone]  If 7PM-7AM, please contact night-coverage [email or web address] Password [email or web address] 08/09/2014, 10:15 AM   LOS: 7 days    HPI/Subjective: No acute overnight events.  Objective: Filed Vitals:   08/09/14 0000 08/09/14 0400 08/09/14 0700 08/09/14 0800  BP: 113/63  126/73   Pulse: 100  90   Temp: 98.4 F (36.9 C) 98.9 F (37.2 C)  98.1 F (36.7 C)  TempSrc: Oral Oral  Oral  Resp: 19  20   Height:      Weight:  92.4 kg (203 lb 11.3 oz)    SpO2: 98%  97%     Intake/Output Summary (Last 24 hours) at 08/09/14 1015 Last data filed at 08/09/14 0700  Gross per 24 hour  Intake    120 ml  Output   2150 ml  Net  -2030 ml    Exam:   General:  Pt is not in acute distress  Cardiovascular: Regular rate and rhythm, S1/S2 appreciated   Respiratory: Clear to auscultation bilaterally, no wheezing, no crackles, no rhonchi  Abdomen: non tender, non distended, bowel sounds present  Extremities: trace pedal pitting edema, pulses DP and PT palpable bilaterally  Neuro: Grossly nonfocal  Data Reviewed: Basic  Metabolic Panel:  Recent Labs Lab 08/04/14 0405 08/05/14 2308 08/06/14 0400 08/06/14 1340 08/07/14 0512 08/09/14 0405  NA 144 142 142 138  --  142  K 3.1* 3.0* 3.2* 4.1  --  3.1*  CL 110 101 103 101  --  101  CO2 27 27 28 25   --  30  GLUCOSE 104* 115* 120* 253*  --  128*  BUN 17 9 10 11   --  20  CREATININE 1.09 0.93 0.99 0.98  --  1.43*  CALCIUM 7.9* 8.1* 8.2* 7.9*  --  8.0*  MG  --   --  1.7  --  2.2  --    Liver Function Tests:  Recent Labs Lab 08/09/14 0405  AST 27  ALT 10  ALKPHOS 73  BILITOT 0.2*  PROT 5.1*  ALBUMIN 2.1*   No results for input(s): LIPASE, AMYLASE in the last 168 hours. No results for input(s): AMMONIA in the last 168 hours. CBC:  Recent Labs Lab 08/03/14 1600 08/05/14 0425 08/05/14 2308 08/06/14 0400 08/08/14 0600  WBC 7.7 7.3  9.6 11.2* 10.0  HGB 9.7* 9.5* 9.9* 9.9* 9.1*  HCT 29.7* 27.8* 29.7* 30.8* 28.6*  MCV 91.4 91.4 91.7 92.2 94.7  PLT 143* 131* 150 147* 152   Cardiac Enzymes: No results for input(s): CKTOTAL, CKMB, CKMBINDEX, TROPONINI in the last 168 hours. BNP: Invalid input(s): POCBNP CBG:  Recent Labs Lab 08/07/14 1305 08/07/14 1853 08/07/14 2312 08/07/14 2345 08/08/14 1851  GLUCAP 133* 187* 148* 135* 122*    Recent Results (from the past 240 hour(s))  MRSA PCR Screening     Status: None   Collection Time: 08/02/14  6:17 AM  Result Value Ref Range Status   MRSA by PCR NEGATIVE NEGATIVE Final  Culture, blood (routine x 2)     Status: None (Preliminary result)   Collection Time: 08/07/14  7:09 PM  Result Value Ref Range Status   Specimen Description BLOOD L ARM  Final   Special Requests BOTTLES DRAWN AEROBIC AND ANAEROBIC 8CC  Final   Culture  Setup Time   Final   Culture   Final           BLOOD CULTURE RECEIVED NO GROWTH TO DATE    Report Status PENDING  Incomplete  Culture, blood (routine x 2)     Status: None (Preliminary result)   Collection Time: 08/07/14  7:20 PM  Result Value Ref Range Status   Specimen Description BLOOD R ARM  Final   Special Requests BOTTLES DRAWN AEROBIC AND ANAEROBIC 10CC  Final   Culture  Setup Time   Final   Culture   Final           BLOOD CULTURE RECEIVED NO GROWTH TO DATE     Report Status PENDING  Incomplete     Scheduled Meds: . antiseptic oral rinse  7 mL Mouth Rinse BID  . cefTRIAXone (ROCEPHIN)  IV  1 g Intravenous QHS  . pantoprazole (PROTONIX) IV  40 mg Intravenous Q12H  . potassium chloride  40 mEq Oral Once   Continuous Infusions: . sodium chloride 50 mL/hr at 08/08/14 0137

## 2014-08-09 NOTE — Progress Notes (Signed)
Rehab Admissions Coordinator Note:  Patient was screened by Retta Diones for appropriateness for an Inpatient Acute Rehab Consult.  At this time, we are recommending Inpatient Rehab consult.  Retta Diones 08/09/2014, 2:47 PM  I can be reached at [phone].

## 2014-08-10 ENCOUNTER — Encounter (HOSPITAL_COMMUNITY): Payer: Self-pay | Admitting: *Deleted

## 2014-08-10 ENCOUNTER — Telehealth: Payer: Self-pay | Admitting: Hematology and Oncology

## 2014-08-10 ENCOUNTER — Inpatient Hospital Stay (HOSPITAL_COMMUNITY): Payer: Medicare Other | Admitting: Anesthesiology

## 2014-08-10 ENCOUNTER — Encounter (HOSPITAL_COMMUNITY)
Admission: EM | Disposition: A | Discharge status: Home Health | Payer: Self-pay | Source: Home / Self Care | Attending: Internal Medicine

## 2014-08-10 DIAGNOSIS — N390 Urinary tract infection, site not specified: Secondary | ICD-10-CM

## 2014-08-10 DIAGNOSIS — G4730 Sleep apnea, unspecified: Secondary | ICD-10-CM

## 2014-08-10 DIAGNOSIS — J9600 Acute respiratory failure, unspecified whether with hypoxia or hypercapnia: Secondary | ICD-10-CM

## 2014-08-10 HISTORY — PX: ESOPHAGOGASTRODUODENOSCOPY (EGD) WITH PROPOFOL: SHX5813

## 2014-08-10 LAB — GLUCOSE, CAPILLARY: Glucose-Capillary: 83 mg/dL (ref 70–99)

## 2014-08-10 LAB — LIPID PANEL
CHOL/HDL RATIO: 10.4 ratio
Cholesterol: 125 mg/dL (ref 0–200)
HDL: 12 mg/dL — ABNORMAL LOW (ref >39)
LDL CALC: 51 mg/dL (ref 0–99)
Triglycerides: 311 mg/dL — ABNORMAL HIGH (ref <150)
VLDL: 62 mg/dL — AB (ref 0–40)

## 2014-08-10 LAB — HEMOGLOBIN A1C
HEMOGLOBIN A1C: 5.9 % — AB (ref <5.7)
Mean Plasma Glucose: 123 mg/dL — ABNORMAL HIGH (ref <117)

## 2014-08-10 SURGERY — ESOPHAGOGASTRODUODENOSCOPY (EGD) WITH PROPOFOL
Anesthesia: Monitor Anesthesia Care

## 2014-08-10 MED ORDER — LIDOCAINE HCL (CARDIAC) 20 MG/ML IV SOLN
INTRAVENOUS | Status: DC | PRN
  Administered 2014-08-10: 100 mg via INTRAVENOUS

## 2014-08-10 MED ORDER — PROPOFOL 10 MG/ML IV BOLUS
INTRAVENOUS | Status: AC
  Filled 2014-08-10: qty 20

## 2014-08-10 MED ORDER — PROPOFOL INFUSION 10 MG/ML OPTIME
INTRAVENOUS | Status: DC | PRN
  Administered 2014-08-10: 140 ug/kg/min via INTRAVENOUS

## 2014-08-10 MED ORDER — DIPHENHYDRAMINE-ZINC ACETATE 2-0.1 % EX CREA
TOPICAL_CREAM | Freq: Every day | CUTANEOUS | Status: DC | PRN
  Filled 2014-08-10: qty 28

## 2014-08-10 MED ORDER — LIDOCAINE HCL (CARDIAC) 20 MG/ML IV SOLN
INTRAVENOUS | Status: AC
  Filled 2014-08-10: qty 5

## 2014-08-10 MED ORDER — BUTAMBEN-TETRACAINE-BENZOCAINE 2-2-14 % EX AERO
INHALATION_SPRAY | CUTANEOUS | Status: DC | PRN
  Administered 2014-08-10: 2 via TOPICAL

## 2014-08-10 SURGICAL SUPPLY — 14 items

## 2014-08-10 NOTE — Progress Notes (Signed)
Martin Morris   DOB:[birth year]   YK#:[id number]   NLZ#:[id number]  Patient Care Team: Dione Housekeeper, MD as PCP - General (Family Medicine) Heath Lark, MD as Consulting Physician (Hematology and Oncology) Irene Shipper, MD as Consulting Physician (Gastroenterology) I have seen the patient, examined him and edited the notes as follows  Subjective: Patient seen and examined. No bleeding issues reported. Stools without blood. Repeat EGD show no evidence of bleeding. Biopsies were obtained.. He has intermittent periods of dyspnea, improved with respiratory therapy. Denies chest pain. He denies abdominal pain, nausea or vomiting. He denies diarrhea. He has periods of intermittent confusion followed by lucidity, in the setting of minor stroke seen on MRI. He follows simple commands. Denies headaches or vision problems at this time.   Scheduled Meds: . antiseptic oral rinse  7 mL Mouth Rinse BID  . atenolol  50 mg Oral Daily  . cefTRIAXone (ROCEPHIN)  IV  1 g Intravenous QHS  . pantoprazole (PROTONIX) IV  40 mg Intravenous Q12H   Continuous Infusions: . sodium chloride 50 mL/hr at 08/08/14 0137   PRN Meds:acetaminophen **OR** acetaminophen, hydrALAZINE, HYDROcodone-acetaminophen, nitroGLYCERIN, ondansetron **OR** ondansetron (ZOFRAN) IV   Objective:  Filed Vitals:   08/10/14 0400  BP: 119/67  Pulse: 68  Temp: 98.9 F (37.2 C)  Resp:       Intake/Output Summary (Last 24 hours) at 08/10/14 0743 Last data filed at 08/10/14 0600  Gross per 24 hour  Intake    990 ml  Output   1375 ml  Net   -385 ml    ECOG PERFORMANCE STATUS: 3  GENERAL:alert, no distress and ill appearing. Answers simple questions and goes back to sleep.  SKIN: skin color, texture, turgor are normal, no rashes or significant lesions. Chronic venous stasis changes EYES: normal, conjunctiva are pink and non-injected, sclera clear OROPHARYNX:no exudate, no erythema and lips, buccal mucosa, and tongue normal  NECK:  supple, thyroid normal size, non-tender, without nodularity LYMPH: no palpable lymphadenopathy in the cervical, axillary or inguinal area LUNGS:trace of rhonchi and rales anteriorly without wheeze, with normal breathing effort HEART: regular rate & rhythm and no murmurs and bilateral lower extremity edema with venous stasis changes ABDOMEN:abdomen soft, non-tender and normal bowel sounds Musculoskeletal:no cyanosis of digits and no clubbing  PSYCH: somewhat lethargic.  NEURO: follows simple commands, strength and sensation intact.Alert to place and person, not to date or year.    CBG (last 3)   Recent Labs  08/09/14 0545 08/09/14 1322 08/09/14 2302  GLUCAP 115* 144* 129*     Labs:   Recent Labs Lab 08/03/14 1600 08/05/14 0425 08/05/14 2308 08/06/14 0400 08/08/14 0600  WBC 7.7 7.3 9.6 11.2* 10.0  HGB 9.7* 9.5* 9.9* 9.9* 9.1*  HCT 29.7* 27.8* 29.7* 30.8* 28.6*  PLT 143* 131* 150 147* 152  MCV 91.4 91.4 91.7 92.2 94.7  MCH 29.8 31.3 30.6 29.6 30.1  MCHC 32.7 34.2 33.3 32.1 31.8  RDW 19.2* 17.5* 17.2* 17.2* 17.2*     Chemistries:    Recent Labs Lab 08/04/14 0405 08/05/14 2308 08/06/14 0400 08/06/14 1340 08/07/14 0512 08/09/14 0405  NA 144 142 142 138  --  142  K 3.1* 3.0* 3.2* 4.1  --  3.1*  CL 110 101 103 101  --  101  CO2 27 27 28 25   --  30  GLUCOSE 104* 115* 120* 253*  --  128*  BUN 17 9 10 11   --  20  CREATININE 1.09 0.93 0.99 0.98  --  1.43*  CALCIUM 7.9* 8.1* 8.2* 7.9*  --  8.0*  MG  --   --  1.7  --  2.2  --   AST  --   --   --   --   --  27  ALT  --   --   --   --   --  10  ALKPHOS  --   --   --   --   --  73  BILITOT  --   --   --   --   --  0.2*    GFR Estimated Creatinine Clearance: 46.2 mL/min (by C-G formula based on Cr of 1.43).  Liver Function Tests:  Recent Labs Lab 08/09/14 0405  AST 27  ALT 10  ALKPHOS 73  BILITOT 0.2*  PROT 5.1*  ALBUMIN 2.1*    Coagulation profile  Recent Labs Lab 08/03/14 1444  INR  1.23  CBG:  Recent Labs Lab 08/07/14 2345 08/08/14 1851 08/09/14 0545 08/09/14 1322 08/09/14 2302  GLUCAP 135* 122* 115* 144* 129*    Imaging Studies:  None today  2 D echo on 12/14: Study Conclusions  - Left ventricle: The cavity size was normal. There was mild focal basal hypertrophy of the septum. The estimated ejection fraction was 55%. Doppler parameters are consistent with abnormal left ventricular relaxation (grade 1 diastolic dysfunction). Doppler parameters are consistent with elevated ventricular end-diastolicfilling pressure. - Mitral valve: Calcified annulus. There was mild regurgitation. - Left atrium: The atrium was moderately dilated. - Atrial septum: No defect or patent foramen ovale was identified. - Tricuspid valve: There was mild-moderate regurgitation. - Pulmonary arteries: PA peak pressure: 44 mm Hg (S).  Assessment/Plan: 80 y.o.  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 He receives Rituxan every alternate cycle. Last chemotherapy was given on 07/11/14, cycle #81. The pattern of recurrence is highly unusual. PET scan on 12/2 shows multifocal hypermetabolism throughout the neck, chest and abdomen. Case was reviewed at the hematology tumor board and overall consensus is to proceed with either biopsy of the stomach mass or cecal mass. Dr. Henrene Pastor and Dr. Ardis Hughs were informed. Dr. Ardis Hughs to proceed with EGD and biopsy today. If repeat biopsy confirmed recurrence of mantle cell lymphoma, the current guidelines suggested treatment with Ibrutinib. His scheduled chemo for 12/7 was cancelled until further notice  Anemia In the setting of acute GI bleed, chronic disease and chronic kidney disease Hb on admission was 7.4 requiring 2 units of blood with good response. He underwent Upper Endoscopy on 12/8, with visualization of a malignant ulcer was seen and clipped. This was  followed by mesenteric angiogram with coil embolization of the right gastroepiploic artery 08/05/2014. Status was complicated by respiratory distress and UTI requiring admission to the stepdown unit. Dr. Tammi Klippel (Radiation Oncology) recommends outpatient radiation therapy to the lymphoma deposit to help with the malignant bleeding ulcer, as he is critically ill.  Continue to transfuse to maintain a Hb above 7, or to 8 if patient remains symptomatic. To date, he received 4 units of blood Appreciate GI and PCCM involvement.  Thrombocytopenia, mild Due to acute bleed, dilution, malignancy and polypharmacy He received 1 unit of platelets on 12/8 due to bleeding Avoid Heparin products. Aspirin and Plavix are on hold. No transfusion is indicated at this time as bleeding resolved  Chronic kidney disease (CKD), stage II (mild) This is chronic in nature, unlikely related to his cancer. Recommend  close observation. Baseline is 1.4  Headaches with abnormal head imaging with acute stroke The findings on his CT scan were not impressive MRI of the brain showed no evidence of intracranial metastases. 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 Asymmetric, nodular soft tissue in the left nasopharynx, concerning for malignancy, observed in PET scan, is seen again, measuring 2 x 1.3 cm 2 D echo negative for embolic disease Antiplatelet agents are contraindicated due to recent GI bleed   Confusion In the setting of acute stroke, and UTI encephalopathy MRI brain is negative for metastatic disease. 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 He is on IV antibiotics and IV fluids Speech Therapy consult pending Will continue to monitor.  DVT prophylaxis On mechanical devices as patient is off anticoagulants due to recent GI bleed.  Acute Respiratory Failure Likely secondary to pulmonary edema Improved  with Lasix and Bipap as per Critical Care Team  Full Code CODE STATUS was readdressed by Dr. Alvy Bimler and the patient wants to remain in full code  Disposition We will sign off. Appointment for December 29 will be made to follow-up with the patient and review test results.  Other medical issues including Symptomatic bradycardia in setting of syncopal event, Obstructive sleep apnea, Urinary tract infection, Hypokalemia and Hypertension as per admitting team and Critical Care Team  Rondel Jumbo, PA-C 08/10/2014  7:43 AM   Danai Gotto, MD 08/10/2014

## 2014-08-10 NOTE — Anesthesia Preprocedure Evaluation (Signed)
Anesthesia Evaluation  Patient identified by MRN, date of birth, ID band Patient awake    Reviewed: Allergy & Precautions, H&P , NPO status , Patient's Chart, lab work & pertinent test results  Airway Mallampati: III  TM Distance: <3 FB Neck ROM: Limited    Dental no notable dental hx. (+) Teeth Intact, Dental Advisory Given   Pulmonary sleep apnea ,  breath sounds clear to auscultation  Pulmonary exam normal       Cardiovascular hypertension, Pt. on medications and Pt. on home beta blockers + CAD, + CABG and + Peripheral Vascular Disease Rhythm:Regular Rate:Normal  /19/2009 a. s/p CABG;  b. Cath 7/11 Patent LIMA to the LAD. Previously known occlusion of a saphenous vein graft to diagonal and sequential to obtuse marginals. Severe diffuse native obtuse marginal and diagonal branch vessel disease. Preserved ejection fraction.;  c. 03/2013 [facility]: mild inflat and inf ischemia, EF 54%->initial med     Neuro/Psych negative neurological ROS  negative psych ROS   GI/Hepatic negative GI ROS, Neg liver ROS,   Endo/Other  negative endocrine ROS  Renal/GU negative Renal ROS  negative genitourinary   Musculoskeletal negative musculoskeletal ROS (+) Arthritis -,   Abdominal   Peds negative pediatric ROS (+)  Hematology  (+) anemia ,   Anesthesia Other Findings   Reproductive/Obstetrics negative OB ROS                             Anesthesia Physical Anesthesia Plan  ASA: III  Anesthesia Plan: MAC   Post-op Pain Management:    Induction:   Airway Management Planned: Simple Face Mask  Additional Equipment:   Intra-op Plan:   Post-operative Plan:   Informed Consent:   Plan Discussed with: Surgeon  Anesthesia Plan Comments:         Anesthesia Quick Evaluation

## 2014-08-10 NOTE — Transfer of Care (Signed)
Immediate Anesthesia Transfer of Care Note  Patient: Martin Morris  Procedure(s) Performed: Procedure(s): ESOPHAGOGASTRODUODENOSCOPY (EGD) WITH PROPOFOL (N/A)  Patient Location: PACU and Endoscopy Unit  Anesthesia Type:MAC  Level of Consciousness: awake  Airway & Oxygen Therapy: Patient Spontanous Breathing and Patient connected to nasal cannula oxygen  Post-op Assessment: Report given to PACU RN and Post -op Vital signs reviewed and stable  Post vital signs: Reviewed and stable  Complications: No apparent anesthesia complications

## 2014-08-10 NOTE — Telephone Encounter (Signed)
lvm for pt regarding to Dec appt....mailed pt appt sched and letter °

## 2014-08-10 NOTE — Progress Notes (Signed)
Upon assessment noticed that patient had a rash on his back extending down to bottom. Paged MD for something to  Help with the itching. Patient is very uncomfortable. Will continue to monitor.

## 2014-08-10 NOTE — Op Note (Signed)
[facility] [place], [address]   ENDOSCOPY PROCEDURE REPORT  PATIENT: Martin, Morris  MR#: [id number] BIRTHDATE: 06/01/1933 , 29  yrs. old GENDER: male ENDOSCOPIST: Milus Banister, MD PROCEDURE DATE:  08/10/2014 PROCEDURE:  EGD w/ biopsy ASA CLASS:     Class III INDICATIONS:  known NH lymphoma, PET avid bleeding lesion in stomach (treated with endocliping and the angiographic embolization). MEDICATIONS: Monitored anesthesia care TOPICAL ANESTHETIC: none  DESCRIPTION OF PROCEDURE: After the risks benefits and alternatives of the procedure were thoroughly explained, informed consent was obtained.  The Pentax Gastroscope [id number] endoscope was introduced through the mouth and advanced to the second portion of the duodenum , Without limitations.  The instrument was slowly withdrawn as the mucosa was fully examined.  There was a 1.5cm centrally ulcerated pancake lesion in mid body of the stomach.  Two previously placed clips were still present on the lesion.  The lesion was biopsies extensively, samples put into saline.  There was only the usual self limited oozing of blood following biopsy.  The examination was otherwise normal. Retroflexed views revealed no abnormalities.     The scope was then withdrawn from the patient and the procedure completed.  COMPLICATIONS: There were no immediate complications.  ENDOSCOPIC IMPRESSION: There was a 1.5cm centrally ulcerated pancake lesion in mid body of the stomach.  Two previously placed clips were still present on the lesion.  The lesion was biopsies extensively, samples put into saline.  There was only the usual self limited oozing of blood following biopsy.  The examination was otherwise normal  RECOMMENDATIONS: Await final pathology   eSigned:  Milus Banister, MD 08/10/2014 12:38 PM    CC: Heath Lark, MD; Scarlette Shorts, MD

## 2014-08-10 NOTE — Anesthesia Postprocedure Evaluation (Signed)
  Anesthesia Post-op Note  Patient: Martin Morris  Procedure(s) Performed: Procedure(s) (LRB): ESOPHAGOGASTRODUODENOSCOPY (EGD) WITH PROPOFOL (N/A)  Patient Location: PACU  Anesthesia Type: MAC  Level of Consciousness: awake and alert   Airway and Oxygen Therapy: Patient Spontanous Breathing  Post-op Pain: mild  Post-op Assessment: Post-op Vital signs reviewed, Patient's Cardiovascular Status Stable, Respiratory Function Stable, Patent Airway and No signs of Nausea or vomiting  Last Vitals:  Filed Vitals:   08/10/14 1322  BP: 133/80  Pulse:   Temp: 36.9 C  Resp: 22    Post-op Vital Signs: stable   Complications: No apparent anesthesia complications

## 2014-08-10 NOTE — Progress Notes (Addendum)
Patient ID: Martin Morris, male   DOB: 24-Feb-1933, 80 y.o.   MRN: [id number] [facility] PROGRESS NOTE  GWIN EAGON ELF:[id number] DOB: 06/09/1933 DOA: 08/02/2014 PCP: [name], MD  Brief narrative:    80 year old male with past medical history of mantle cell lymphoma (diagnosed in 10/2006) undergoing active chemotherapy (under Dr. Calton Dach care), most recently has had PET scan 07/27/14 which showed diffuse progression of the disease most obvious in fundus of stomach, as well as descending colon and cecum. Patient also has history of coronary artery disease, the referral vascular disease, dyslipidemia, chronic kidney disease stage II, anemia of chronic disease, sleep apnea.  Patient presented to [facility] 08/02/2014 with acute GI bleed. Patient was evaluated by GI. He was intubated for EGD which revealed malignant gastric ulcer status post endoclipping and resolution of bleeding. Please note patient received PPI gtt and blood transfusion as well. PCCM consulted for syncope and bradycardia following hematemesis. Additionally, IR consulted for elective embolization. Patinet is status post mesenteric angiogram with coil embolization of the right gastroepiploic artery 08/05/2014.   Radiation oncology has seen the patient in consultation for input on palliative radiotherapy to lymphoma. Due to acute illness the recommendation is for outpatient follow-up.  Additionally, MRI of the brain was done to evaluate the possibility of intracranial metastases seen on CT head. MRI did not reveal metastatic disease in the brain but it did show acute posterior circulation infarct in the left cerebellum and right occipital lobe. Neurology was consulted for input on management. Patient is not on aspirin and Plavix because of risk of bleeding.  Off note, on 08/07/2014 patient went into respiratory distress, was lethargic and had a fever. Fever workup obtained. He required BiPAP to keep oxygen saturations  above 90% and was subsequently transfered to stepdown unit. He was found to have clearing pulmonary edema. He was given a small bolus of Lasix IV. He was started on Rocephin for urinary tract infection which was evident on urinalysis. Blood cultures to date are negative. Urine culture is pending at this time. Critical care medicine was consulted the following morning on 08/08/2014 to be on standby should patient require intubation. Patient's respiratory status continues to be stable off BiPAP.  Assessment/Plan:     Prncipal Problem: Acute hemorrhagic shock / upper GI bleed / acute blood loss anemia  - Secondary to upper GI bleed secondary to malignant proximal gastric ulcer as seen on EGD 08/02/2014  - Patient is status post Endo Clip 3. Also, status post mesenteric arteriogram with coil embolization of the right gastroepiploic artery done by IR 08/05/2014. - Patient status post 4 unit packed red blood cells and 1 unit platelets(08/02/14) transfusion.  - Plavix and Asprin still on hold due to risk of bleeding. - Continue protonix 40 mg IV Q 12 hours  - Patient continues to be hemodynamically stable with no evidence of bleeding. Patient is tolerating soft diet.  Active Problems: Acute respiratory failure with hypoxia / pulmonary edema - Chest x-ray on 08/08/2014 showed clearing of pulmonary edema. Patient received 1 dose of Lasix overnight. He was moved to stepdown unit because he required BiPAP. - Critical care medicine consulted on 08/08/2014 should patient require intubation. Fortunately, he started to improve and his respiratory status continues to be stable off BiPAP. - Order was placed for transfer to telemetry however no bed available.  Urinary tract infection secondary to Escherichia coli - Patient febrile overnight 08/07/2014. Urinalysis with evidence of infection. Patient started on Rocephin. - Urine  culture is growing Escherichia coli sensitive to Rocephin.  Malignant proximal  gastric ulcer - Status post Endo Clip 3 08/02/2014. Status post mesenteric arteriogram with coil embolization right gastroepiploic artery per interventional radiology 08/05/2014.  - Radiation oncology has been consulted and saw patient 08/03/2014 for possible radiation therapy. Rad oncology re-evaluated 08/08/2014 for palliative gastric radiation. This will likely take place outpatient due to multiple acute medical issues at this time.  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 He receives Rituxan every alternate cycle. - Last chemotherapy was given on 07/11/14, cycle #81 - CT of the head showed possible early metastatic disease. - MRI brain with contrast did not show evidence of intracranial metastasis. - Please note that the case was reviewed at the hematology tumor board and overall consensus was to proceed with either biopsy of the stomach mass or cecal mass. Dr. Henrene Pastor and Dr. Ardis Hughs were informed. Dr. Ardis Hughs will proceed with EGD and biopsy today 08/10/2014. If repeat biopsy confirmed recurrence of mantle cell lymphoma, the current guidelines suggested treatment with Ibrutinib.  Acute CVA - MRI of the brain done 08/08/2014 with findings of punctate, acute posterior circulation infarct involving the left cerebellum and right occipital lobe. Patient is off of aspirin and Plavix due to risk of bleeding. - Appreciate neurology consult and recommendations. For now, neurology recommends holding off on aspirin and Plavix.  Thrombocytopenia, mild - Due to acute bleed, malignancy  - Patient received 1 unit of platelets on 12/8 due to bleeding  Chronic kidney disease (CKD), stage II (mild) - This is chronic in nature, unlikely related to his cancer.  - Baseline is 1.4. Creatinine normalized over a period of time but then this morning a bump to 1.43 noted which is likely because of Lasix given for pulmonary  edema.  Hypokalemia - Likely secondary to recent Lasix given for pulmonary edema on 08/07/2014 - Supplemented.    Symptomatic bradycardia in the setting of syncopal event - Likely secondary to hemorrhagic shock. Antihypertensives on hold. Aspirin and Plavix on hold.  - Resolved.   Acute encephalopathy - Likely secondary to syncopal event secondary to hemorrhagic shock - Mental status at baseline, alert and oriented to time, place and person  Essential hypertension - has had hypotension so antihypertensives were on hold. May continue as needed hydralazine    DVT Prophylaxis  - aspirin and plavix on hold due to risk of bleeding  - SCD's bilaterally   Code Status: Full.  Family Communication: Family at the bedside this am Disposition Plan: Remains inpatient; transfer to telemetry floor today    IV access:   PAC  Procedures and diagnostic studies:     Mr Kizzie Fantasia Contrast 08/08/2014  1. Punctate, acute posterior circulation infarcts involving the left cerebellum and right occipital lobe. 2. No evidence of intracranial metastases. 3. Moderate chronic small vessel ischemic disease and cerebral atrophy. Chronic infarcts as above. 4. Asymmetric, nodular soft tissue in the left nasopharynx, concerning for malignancy, specifically involvement by patient's known lymphoma, as described on PET-CT.   Electronically Signed   By: Logan Bores   On: 08/08/2014 15:19   Ir Angiogram Visceral Selective 08/06/2014    1. Technically successful coil embolization of the distal right gastroepiploic artery at the level of the endoscopic clips along the greater curvature of the stomach, site of known bleeding lesion.   Electronically Signed   By: Arne Cleveland M.D.   On:  08/06/2014 09:17   Ir Angiogram Visceral Selective 08/06/2014    1. Technically successful coil embolization of the distal right gastroepiploic artery at the level of the endoscopic clips along the greater curvature of the  stomach, site of known bleeding lesion.   Electronically Signed   By: Arne Cleveland M.D.   On: 08/06/2014 09:17   Ir Angiogram Visceral Selective 08/06/2014  1. Technically successful coil embolization of the distal right gastroepiploic artery at the level of the endoscopic clips along the greater curvature of the stomach, site of known bleeding lesion.   Electronically Signed   By: Arne Cleveland M.D.   On: 08/06/2014 09:17   Ir Angiogram Selective Each Additional Vessel 08/06/2014  1. Technically successful coil embolization of the distal right gastroepiploic artery at the level of the endoscopic clips along the greater curvature of the stomach, site of known bleeding lesion.   Electronically Signed   By: Arne Cleveland M.D.   On: 08/06/2014 09:17   Ir Angiogram Selective Each Additional Vessel 08/06/2014  1. Technically successful coil embolization of the distal right gastroepiploic artery at the level of the endoscopic clips along the greater curvature of the stomach, site of known bleeding lesion.   Electronically Signed   By: Arne Cleveland M.D.   On: 08/06/2014 09:17   Ir US Guide Vasc Access Right 08/06/2014  1. Technically successful coil embolization of the distal right gastroepiploic artery at the level of the endoscopic clips along the greater curvature of the stomach, site of known bleeding lesion.   Electronically Signed   By: Arne Cleveland M.D.   On: 08/06/2014 09:17   Dg Chest Port 1 View 08/08/2014  Clearing pulmonary edema.   Electronically Signed   By: Jorje Guild M.D.   On: 08/08/2014 05:16   Dg Chest Port 1 View 08/07/2014    Findings suggest congestive heart failure with mild interstitial pulmonary edema   Electronically Signed   By: Skipper Cliche M.D.   On: 08/07/2014 18:44   Ir Embo Arterial Not [place] 08/06/2014  1. Technically successful coil embolization of the distal right gastroepiploic artery at the level of the endoscopic clips  along the greater curvature of the stomach, site of known bleeding lesion.     Upper endoscopy 08/02/2014-DrHenrene Pastor  Intubation 08/02/2014>>> extubation 08/03/2014  Medical Consultants:   PCCM Dr. Elsworth Soho 08/02/2014; 08/08/2014  Gastroenterology: Dr. Henrene Pastor 08/02/2014  Oncology: Dr. Alvy Bimler 08/03/2014  Interventional radiology: Dr. Anselm Pancoast 08/04/2014  Radiation/ oncology: Dr. Tammi Klippel 08/03/2014  Other Consultants:   Physical therapy   IAnti-Infectives:    Rocephin 08/07/2014 -->   Leisa Lenz, MD  [facility] Pager [phone]  If 7PM-7AM, please contact night-coverage [email or web address] Password [facility] 08/10/2014, 10:55 AM   LOS: 8 days    HPI/Subjective: No acute overnight events.  Objective: Filed Vitals:   08/09/14 2000 08/10/14 0000 08/10/14 0400 08/10/14 0800  BP: 142/71 113/61 119/67 142/77  Pulse: 72 73 68 69  Temp:  98.4 F (36.9 C) 98.9 F (37.2 C) 97.7 F (36.5 C)  TempSrc:  Oral Oral Oral  Resp: 20   20  Height:      Weight:      SpO2: 98% 97% 94% 95%    Intake/Output Summary (Last 24 hours) at 08/10/14 1055 Last data filed at 08/10/14 0900  Gross per 24 hour  Intake    750 ml  Output   1375 ml  Net   -625 ml  Exam:   General:  Pt is alert, not in acute distress  Cardiovascular: Regular rate and rhythm, S1/S2 appreciated   Respiratory: no wheezing, no crackles, no rhonchi  Abdomen: non distended, bowel sounds present  Extremities: No edema, pulses DP and PT palpable bilaterally  Neuro: Grossly nonfocal  Data Reviewed: Basic Metabolic Panel:  Recent Labs Lab 08/04/14 0405 08/05/14 2308 08/06/14 0400 08/06/14 1340 08/07/14 0512 08/09/14 0405  NA 144 142 142 138  --  142  K 3.1* 3.0* 3.2* 4.1  --  3.1*  CL 110 101 103 101  --  101  CO2 27 27 28 25   --  30  GLUCOSE 104* 115* 120* 253*  --  128*  BUN 17 9 10 11   --  20  CREATININE 1.09 0.93 0.99 0.98  --  1.43*  CALCIUM 7.9* 8.1* 8.2* 7.9*  --  8.0*  MG  --   --  1.7   --  2.2  --    Liver Function Tests:  Recent Labs Lab 08/09/14 0405  AST 27  ALT 10  ALKPHOS 73  BILITOT 0.2*  PROT 5.1*  ALBUMIN 2.1*   No results for input(s): LIPASE, AMYLASE in the last 168 hours. No results for input(s): AMMONIA in the last 168 hours. CBC:  Recent Labs Lab 08/03/14 1600 08/05/14 0425 08/05/14 2308 08/06/14 0400 08/08/14 0600  WBC 7.7 7.3 9.6 11.2* 10.0  HGB 9.7* 9.5* 9.9* 9.9* 9.1*  HCT 29.7* 27.8* 29.7* 30.8* 28.6*  MCV 91.4 91.4 91.7 92.2 94.7  PLT 143* 131* 150 147* 152   Cardiac Enzymes: No results for input(s): CKTOTAL, CKMB, CKMBINDEX, TROPONINI in the last 168 hours. BNP: Invalid input(s): POCBNP CBG:  Recent Labs Lab 08/07/14 2345 08/08/14 1851 08/09/14 0545 08/09/14 1322 08/09/14 2302  GLUCAP 135* 122* 115* 144* 129*    MRSA PCR Screening     Status: None   Collection Time: 08/02/14  6:17 AM  Result Value Ref Range Status   MRSA by PCR NEGATIVE NEGATIVE Final  Culture, blood (routine x 2)     Status: None (Preliminary result)   Collection Time: 08/07/14  7:09 PM  Result Value Ref Range Status   Specimen Description BLOOD L ARM  Final   Special Requests BOTTLES DRAWN AEROBIC AND ANAEROBIC 8CC  Final   Culture  Setup Time   Final    08/08/2014 03:11 Performed at [facility]    Culture   Final           BLOOD CULTURE RECEIVED NO GROWTH TO DATE    Report Status PENDING  Incomplete  Culture, blood (routine x 2)     Status: None (Preliminary result)   Collection Time: 08/07/14  7:20 PM  Result Value Ref Range Status   Specimen Description BLOOD R ARM  Final   Special Requests BOTTLES DRAWN AEROBIC AND ANAEROBIC 10CC  Final   Culture  Setup Time   Final   Culture   Final           BLOOD CULTURE RECEIVED NO GROWTH TO DATE    Report Status PENDING  Incomplete  Urine culture     Status: None   Collection Time: 08/07/14 10:56 PM  Result Value Ref Range Status   Specimen Description URINE, RANDOM  Final   Special  Requests NONE  Final   Culture  Setup Time   Final   Colony Count   Final    >=100,000 COLONIES/ML   Culture  Final    ESCHERICHIA COLI   Report Status 08/09/2014 FINAL  Final   Organism ID, Bacteria ESCHERICHIA COLI  Final      Susceptibility   Escherichia coli - MIC*    AMPICILLIN >=32 RESISTANT Resistant     CEFAZOLIN <=4 SENSITIVE Sensitive     CEFTRIAXONE <=1 SENSITIVE Sensitive     CIPROFLOXACIN 1 SENSITIVE Sensitive     GENTAMICIN >=16 RESISTANT Resistant     LEVOFLOXACIN 1 SENSITIVE Sensitive     NITROFURANTOIN <=16 SENSITIVE Sensitive     TOBRAMYCIN 8 INTERMEDIATE Intermediate     TRIMETH/SULFA >=320 RESISTANT Resistant     PIP/TAZO <=4 SENSITIVE Sensitive     * ESCHERICHIA COLI     Scheduled Meds: . antiseptic oral rinse  7 mL Mouth Rinse BID  . atenolol  50 mg Oral Daily  . cefTRIAXone (ROCEPHIN)  IV  1 g Intravenous QHS  . pantoprazole (PROTONIX) IV  40 mg Intravenous Q12H   Continuous Infusions: . sodium chloride 50 mL/hr at 08/08/14 0137

## 2014-08-10 NOTE — Progress Notes (Addendum)
Subjective: No overnight events. 2D echo completed and unremarkable. Scheduled for biopsy with GI this morning.   Objective: Current vital signs: BP 142/77 mmHg  Pulse 69  Temp(Src) 98.9 F (37.2 C) (Oral)  Resp 20  Ht 5\' 8"  (1.727 m)  Wt 92.4 kg (203 lb 11.3 oz)  BMI 30.98 kg/m2  SpO2 95% Vital signs in last 24 hours: Temp:  [98.2 F (36.8 C)-98.9 F (37.2 C)] 98.9 F (37.2 C) (12/16 0400) Pulse Rate:  [68-98] 69 (12/16 0800) Resp:  [18-21] 20 (12/16 0800) BP: (109-142)/(61-78) 142/77 mmHg (12/16 0800) SpO2:  [94 %-100 %] 95 % (12/16 0800)  Intake/Output from previous day: 12/15 0701 - 12/16 0700 In: 1000 [P.O.:720; I.V.:230; IV Piggyback:50] Out: 7673 [Urine:1375] Intake/Output this shift: Total I/O In: 20 [I.V.:20] Out: -  Nutritional status: Diet NPO time specified  Neurologic Exam: Mental Status: Alert, oriented, thought content appropriate. Speech fluent without evidence of aphasia. Cranial Nerves: II: funduscopic exam wnl bilaterally, visual fields grossly normal, pupils equal, round, reactive to light and accommodation III,IV, VI: ptosis not present, extra-ocular motions intact bilaterally V,VII: smile symmetric, facial light touch sensation normal bilaterally Motor: 5/5 strength bilateral UE Proximal bilateral LE 4/5, distal 5/5 Sensory:  light touch intact throughout, bilaterally Deep Tendon Reflexes: 2+ and symmetric throughout Plantars: Right: downgoingLeft: downgoing  Lab Results: Basic Metabolic Panel:  Recent Labs Lab 08/04/14 0405 08/05/14 2308 08/06/14 0400 08/06/14 1340 08/07/14 0512 08/09/14 0405  NA 144 142 142 138  --  142  K 3.1* 3.0* 3.2* 4.1  --  3.1*  CL 110 101 103 101  --  101  CO2 27 27 28 25   --  30  GLUCOSE 104* 115* 120* 253*  --  128*  BUN 17 9 10 11   --  20  CREATININE 1.09 0.93 0.99 0.98  --  1.43*  CALCIUM 7.9* 8.1* 8.2* 7.9*  --  8.0*  MG  --   --  1.7  --  2.2  --     Liver  Function Tests:  Recent Labs Lab 08/09/14 0405  AST 27  ALT 10  ALKPHOS 73  BILITOT 0.2*  PROT 5.1*  ALBUMIN 2.1*   No results for input(s): LIPASE, AMYLASE in the last 168 hours. No results for input(s): AMMONIA in the last 168 hours.  CBC:  Recent Labs Lab 08/03/14 1600 08/05/14 0425 08/05/14 2308 08/06/14 0400 08/08/14 0600  WBC 7.7 7.3 9.6 11.2* 10.0  HGB 9.7* 9.5* 9.9* 9.9* 9.1*  HCT 29.7* 27.8* 29.7* 30.8* 28.6*  MCV 91.4 91.4 91.7 92.2 94.7  PLT 143* 131* 150 147* 152    Cardiac Enzymes: No results for input(s): CKTOTAL, CKMB, CKMBINDEX, TROPONINI in the last 168 hours.  Lipid Panel: No results for input(s): CHOL, TRIG, HDL, CHOLHDL, VLDL, LDLCALC in the last 168 hours.  CBG:  Recent Labs Lab 08/07/14 2345 08/08/14 1851 08/09/14 0545 08/09/14 1322 08/09/14 2302  GLUCAP 135* 122* 115* 144* 129*    Microbiology: Results for orders placed or performed during the hospital encounter of 08/02/14  MRSA PCR Screening     Status: None   Collection Time: 08/02/14  6:17 AM  Result Value Ref Range Status   MRSA by PCR NEGATIVE NEGATIVE Final    Comment:        The GeneXpert MRSA Assay (FDA approved for NASAL specimens only), is one component of a comprehensive MRSA colonization surveillance program. It is not intended to diagnose MRSA infection nor to guide or monitor  treatment for MRSA infections.   Culture, blood (routine x 2)     Status: None (Preliminary result)   Collection Time: 08/07/14  7:09 PM  Result Value Ref Range Status   Specimen Description BLOOD L ARM  Final   Special Requests BOTTLES DRAWN AEROBIC AND ANAEROBIC 8CC  Final   Culture  Setup Time   Final    08/08/2014 03:11 Performed at [facility]    Culture   Final           BLOOD CULTURE RECEIVED NO GROWTH TO DATE CULTURE WILL BE HELD FOR 5 DAYS BEFORE ISSUING A FINAL NEGATIVE REPORT Performed at [facility]    Report Status PENDING  Incomplete  Culture,  blood (routine x 2)     Status: None (Preliminary result)   Collection Time: 08/07/14  7:20 PM  Result Value Ref Range Status   Specimen Description BLOOD R ARM  Final   Special Requests BOTTLES DRAWN AEROBIC AND ANAEROBIC 10CC  Final   Culture  Setup Time   Final    08/08/2014 03:11 Performed at [facility]    Culture   Final           BLOOD CULTURE RECEIVED NO GROWTH TO DATE CULTURE WILL BE HELD FOR 5 DAYS BEFORE ISSUING A FINAL NEGATIVE REPORT Performed at [facility]    Report Status PENDING  Incomplete  Urine culture     Status: None   Collection Time: 08/07/14 10:56 PM  Result Value Ref Range Status   Specimen Description URINE, RANDOM  Final   Special Requests NONE  Final   Culture  Setup Time   Final    08/08/2014 10:02 Performed at [facility]   Final    >=100,000 COLONIES/ML Performed at [facility]    Culture   Final    ESCHERICHIA COLI Performed at [facility]    Report Status 08/09/2014 FINAL  Final   Organism ID, Bacteria ESCHERICHIA COLI  Final      Susceptibility   Escherichia coli - MIC*    AMPICILLIN >=32 RESISTANT Resistant     CEFAZOLIN <=4 SENSITIVE Sensitive     CEFTRIAXONE <=1 SENSITIVE Sensitive     CIPROFLOXACIN 1 SENSITIVE Sensitive     GENTAMICIN >=16 RESISTANT Resistant     LEVOFLOXACIN 1 SENSITIVE Sensitive     NITROFURANTOIN <=16 SENSITIVE Sensitive     TOBRAMYCIN 8 INTERMEDIATE Intermediate     TRIMETH/SULFA >=320 RESISTANT Resistant     PIP/TAZO <=4 SENSITIVE Sensitive     * ESCHERICHIA COLI    Coagulation Studies: No results for input(s): LABPROT, INR in the last 72 hours.  Imaging: Mr Jeri Cos PY Contrast  08/08/2014   CLINICAL DATA:  Mantle cell lymphoma. Evaluate for brain metastases.  EXAM: MRI HEAD WITHOUT AND WITH CONTRAST  TECHNIQUE: Multiplanar, multiecho pulse sequences of the brain and surrounding structures were obtained without and with intravenous contrast.   CONTRAST:  31mL MULTIHANCE GADOBENATE DIMEGLUMINE 529 MG/ML IV SOLN  COMPARISON:  Head CT 08/02/2014  FINDINGS: There are several punctate foci of cortical of restricted diffusion in the right occipital lobe without associated enhancement, consistent with acute infarcts. There is also a subcentimeter focus of acute infarction in the left cerebellum. There is no evidence of intracranial hemorrhage, mass, midline shift, or extra-axial fluid collection. There is moderate ventricular enlargement which is slightly out of proportion to the degree of sulcal enlargement, likely  reflecting central predominant cerebral atrophy. Patchy and confluent T2 hyperintensities are present throughout the subcortical and deep cerebral white matter bilaterally with mild T2 hyperintensities in the brainstem, nonspecific but compatible with moderate chronic small vessel ischemic disease. A  Small, remote cortical/subcortical infarct is noted in the right frontal lobe. Remote subcortical right parietal infarct is noted, and there is also encephalomalacia in the right occipital lobe likely reflecting a chronic infarct. Small, chronic infarcts are also present in the corona radiata bilaterally and right cerebellum. No abnormal brain parenchymal or definite abnormal meningeal enhancement is identified.  There is asymmetric masslike soft tissue thickening in the left posterior nasopharynx measuring approximately 2.0 x 1.3 cm corresponding to abnormal FDG uptake on recent PET-CT. Prior right cataract extraction is noted. There is a small right mastoid effusion. Distal right vertebral artery flow void is not well seen and may be either hypoplastic or occluded. Other major intracranial vascular flow voids are preserved.  IMPRESSION: 1. Punctate, acute posterior circulation infarcts involving the left cerebellum and right occipital lobe. 2. No evidence of intracranial metastases. 3. Moderate chronic small vessel ischemic disease and cerebral  atrophy. Chronic infarcts as above. 4. Asymmetric, nodular soft tissue in the left nasopharynx, concerning for malignancy, specifically involvement by patient's known lymphoma, as described on PET-CT.   Electronically Signed   By: Logan Bores   On: 08/08/2014 15:19    Medications:  Scheduled: . antiseptic oral rinse  7 mL Mouth Rinse BID  . atenolol  50 mg Oral Daily  . cefTRIAXone (ROCEPHIN)  IV  1 g Intravenous QHS  . pantoprazole (PROTONIX) IV  40 mg Intravenous Q12H    Assessment/Plan: 80 y.o. male past medical history of mantle cell lymphoma, coronary artery disease, peripheral vascular disease, dyslipidemia, chronic kidney disease stage II, anemia of chronic disease, sleep apnea presenting with acute GI bleed s/p coil embolization. MRI brain completed for question of metastatic disease show 2 posterior circulation punctate infarcts. Suspect these are likely incidental findings.   -holding all antiplatelet due to recent GI bleed -check Hemoglobin A1c and Lipid panel -check MRA head.  -consider statin if medically indicated -have patient follow up with Dr Erlinda Hong at [facility] upon discharge    LOS: 8 days   Jim Like, DO [facility] [phone]  If 7pm- 7am, please page neurology on call as listed in [place]. 08/10/2014  9:19 AM

## 2014-08-10 NOTE — Progress Notes (Signed)
Received patient from Endoscopy after having EGD performed. Report from endo was given as well as report from ICU/SD from [facility]. Agree with Carolines assessment. VSS on admission. Food ordered for patient and family and patient is currently resting comfortably in bed. Will continue to monitor.

## 2014-08-11 ENCOUNTER — Encounter (HOSPITAL_COMMUNITY): Payer: Self-pay | Admitting: Gastroenterology

## 2014-08-11 LAB — GLUCOSE, CAPILLARY
GLUCOSE-CAPILLARY: 115 mg/dL — AB (ref 70–99)
GLUCOSE-CAPILLARY: 94 mg/dL (ref 70–99)
Glucose-Capillary: 124 mg/dL — ABNORMAL HIGH (ref 70–99)
Glucose-Capillary: 159 mg/dL — ABNORMAL HIGH (ref 70–99)
Glucose-Capillary: 140 mg/dL — ABNORMAL HIGH (ref 70–99)

## 2014-08-11 MED ORDER — PANTOPRAZOLE SODIUM 40 MG PO TBEC
40.0000 mg | DELAYED_RELEASE_TABLET | Freq: Two times a day (BID) | ORAL | Status: DC
  Administered 2014-08-11 – 2014-08-12 (×3): 40 mg via ORAL
  Filled 2014-08-11 (×4): qty 1

## 2014-08-11 NOTE — Progress Notes (Signed)
PT Cancellation Note  Patient Details Name: Martin Morris MRN: [id number] DOB: 1933-07-30   Cancelled Treatment:     EGD scheduled for today.  Will check back later as schedule permits.   Nathanial Rancher 08/11/2014, 8:25 AM

## 2014-08-11 NOTE — Progress Notes (Signed)
Patient ID: Martin Morris, male   DOB: [birth year], 80 y.o.   MRN: [id number] [facility] PROGRESS NOTE  Martin Morris XKG:[id number] DOB: 02-17-1933 DOA: 08/02/2014 PCP: [name], MD  Brief narrative:    80 year old male with past medical history of mantle cell lymphoma (diagnosed in 10/2006) undergoing active chemotherapy (under Dr. Calton Dach care), most recently has had PET scan 07/27/14 which showed diffuse progression of the disease most obvious in fundus of stomach, as well as descending colon and cecum. Patient also has history of coronary artery disease, the referral vascular disease, dyslipidemia, chronic kidney disease stage II, anemia of chronic disease, sleep apnea.  Patient presented to [facility] 08/02/2014 with acute GI bleed. Patient was evaluated by GI. He was intubated for EGD which revealed malignant gastric ulcer status post endoclipping and resolution of bleeding. Please note patient received PPI gtt and blood transfusion as well. PCCM consulted for syncope and bradycardia following hematemesis. Additionally, IR consulted for elective embolization. Patinet is status post mesenteric angiogram with coil embolization of the right gastroepiploic artery 08/05/2014.   Radiation oncology has seen the patient in consultation for input on palliative radiotherapy to lymphoma. Due to acute illness the recommendation is for outpatient follow-up.  Additionally, MRI of the brain was done to evaluate the possibility of intracranial metastases seen on CT head. MRI did not reveal metastatic disease in the brain but it did show acute posterior circulation infarct in the left cerebellum and right occipital lobe. Neurology was consulted for input on management. Patient is not on aspirin and Plavix because of risk of bleeding.  Off note, on 08/07/2014 patient went into respiratory distress, was lethargic and had a fever. Fever workup obtained. He required BiPAP to keep oxygen saturations  above 90% and was subsequently transfered to stepdown unit. He was found to have clearing pulmonary edema. He was given a small bolus of Lasix IV. He was started on Rocephin for urinary tract infection which was evident on urinalysis. Blood cultures to date are negative. Urine culture is pending at this time. Critical care medicine was consulted the following morning on 08/08/2014 to be on standby should patient require intubation. Patient's respiratory status continues to be stable off BiPAP.  Most recently, patient underwent EGD, 08/10/2014 with biopsy of the 1.5 cm central ulcerated lesion in the mid body of the stomach. We will follow up on biopsy results. He is approaching the discharge home likely in next 24-48 hours. He has declined SNF offers and prefers to go home on discharge.   Assessment/Plan:     Prncipal Problem: Acute hemorrhagic shock / upper GI bleed / acute blood loss anemia  - Secondary to upper GI bleed secondary to malignant proximal gastric ulcer as seen on EGD 08/02/2014  - Patient is status post Endo Clip 3. Also, status post mesenteric arteriogram with coil embolization of the right gastroepiploic artery done by IR 08/05/2014. - Patient status post 4 unit packed red blood cells and 1 unit platelets(08/02/14) transfusion.  - Plavix and Asprin still on hold due to risk of bleeding. - Continue protonix 40 mg IV Q 12 hours  - Patient continues to be hemodynamically stable with no evidence of bleeding. Patient is tolerating soft diet.  Active Problems: Acute respiratory failure with hypoxia / pulmonary edema - Chest x-ray on 08/08/2014 showed clearing of pulmonary edema. Patient received 1 dose of Lasix overnight. He was moved to stepdown unit because he required BiPAP. - Critical care medicine consulted on 08/08/2014 should  patient require intubation. Fortunately, he started to improve and his respiratory status continues to be stable.  Urinary tract infection secondary  to Escherichia coli - Patient febrile 08/07/2014. Urinalysis with evidence of infection. Patient started on Rocephin. - Urine culture is growing Escherichia coli sensitive to Rocephin.  Malignant proximal gastric ulcer - Status post Endo Clip 3 08/02/2014. Status post mesenteric arteriogram with coil embolization right gastroepiploic artery per interventional radiology 08/05/2014.  - Radiation oncology has been consulted and saw patient 08/03/2014 for possible radiation therapy. Rad oncology re-evaluated the patient 08/08/2014 for palliative gastric radiation. This will likely take place outpatient due to multiple acute medical issues at this time.  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 He receives Rituxan every alternate cycle. - Last chemotherapy was given on 07/11/14, cycle #81 - CT of the head showed possible early metastatic disease. - MRI brain with contrast did not show evidence of intracranial metastasis. - Please note that the case was reviewed at the hematology tumor board and overall consensus was to proceed with either biopsy of the stomach mass or cecal mass. Dr. Henrene Pastor and Dr. Ardis Hughs were informed. Dr. Ardis Hughs has done EGD and biopsy of central ulcerated lesion in the body of the stomach 08/10/2014. If biopsy confirmed recurrence of mantle cell lymphoma, the current guidelines suggested treatment with Ibrutinib.  Acute CVA - MRI of the brain done 08/08/2014 with findings of punctate, acute posterior circulation infarct involving the left cerebellum and right occipital lobe. Patient is off of aspirin and Plavix due to risk of bleeding. - Appreciate neurology consult and recommendations. For now, neurology recommends holding off on aspirin and Plavix.  Thrombocytopenia, mild - Due to acute bleed, malignancy  - Patient received 1 unit of platelets on 12/8 due to bleeding  Chronic kidney  disease (CKD), stage II (mild) - This is chronic in nature, unlikely related to his cancer.  - Baseline is 1.4. Creatinine normalized over a period of time but then this morning a bump to 1.43 noted which is likely because of Lasix given for pulmonary edema.  Hypokalemia - Likely secondary to recent Lasix given for pulmonary edema on 08/07/2014 - Supplemented.    Symptomatic bradycardia in the setting of syncopal event - Likely secondary to hemorrhagic shock. Antihypertensives on hold. Aspirin and Plavix on hold.  - Resolved.  Acute encephalopathy - Likely secondary to syncopal event secondary to hemorrhagic shock - Mental status at baseline, alert and oriented to time, place and person  Essential hypertension - has had hypotension so antihypertensives were on hold. May continue as needed hydralazine    DVT Prophylaxis  - aspirin and plavix on hold due to risk of bleeding  - SCD's bilaterally   Code Status: Full.  Family Communication: Family at the bedside this am Disposition Plan: home when stable; [place] orders in place.    IV access:   PAC  Procedures and diagnostic studies:     Mr Kizzie Fantasia Contrast 08/08/2014  1. Punctate, acute posterior circulation infarcts involving the left cerebellum and right occipital lobe. 2. No evidence of intracranial metastases. 3. Moderate chronic small vessel ischemic disease and cerebral atrophy. Chronic infarcts as above. 4. Asymmetric, nodular soft tissue in the left nasopharynx, concerning for malignancy, specifically involvement by patient's known lymphoma, as described on PET-CT.   Electronically Signed   By: Logan Bores   On: 08/08/2014 15:19   Ir Angiogram Visceral Selective 08/06/2014  1. Technically successful coil embolization of the distal right gastroepiploic artery at the level of the endoscopic clips along the greater curvature of the stomach, site of known bleeding lesion.   Electronically Signed   By: Arne Cleveland M.D.    On: 08/06/2014 09:17   Ir Angiogram Visceral Selective 08/06/2014    1. Technically successful coil embolization of the distal right gastroepiploic artery at the level of the endoscopic clips along the greater curvature of the stomach, site of known bleeding lesion.   Electronically Signed   By: Arne Cleveland M.D.   On: 08/06/2014 09:17   Ir Angiogram Visceral Selective 08/06/2014  1. Technically successful coil embolization of the distal right gastroepiploic artery at the level of the endoscopic clips along the greater curvature of the stomach, site of known bleeding lesion.   Electronically Signed   By: Arne Cleveland M.D.   On: 08/06/2014 09:17   Ir Angiogram Selective Each Additional Vessel 08/06/2014  1. Technically successful coil embolization of the distal right gastroepiploic artery at the level of the endoscopic clips along the greater curvature of the stomach, site of known bleeding lesion.   Electronically Signed   By: Arne Cleveland M.D.   On: 08/06/2014 09:17   Ir Angiogram Selective Each Additional Vessel 08/06/2014  1. Technically successful coil embolization of the distal right gastroepiploic artery at the level of the endoscopic clips along the greater curvature of the stomach, site of known bleeding lesion.   Electronically Signed   By: Arne Cleveland M.D.   On: 08/06/2014 09:17   Ir US Guide Vasc Access Right 08/06/2014  1. Technically successful coil embolization of the distal right gastroepiploic artery at the level of the endoscopic clips along the greater curvature of the stomach, site of known bleeding lesion.   Electronically Signed   By: Arne Cleveland M.D.   On: 08/06/2014 09:17   Dg Chest Port 1 View 08/08/2014  Clearing pulmonary edema.   Electronically Signed   By: Jorje Guild M.D.   On: 08/08/2014 05:16   Dg Chest Port 1 View 08/07/2014    Findings suggest congestive heart failure with mild interstitial pulmonary edema   Electronically Signed   By: Skipper Cliche M.D.   On: 08/07/2014 18:44   Ir Embo Arterial Not Alicia 08/06/2014  1. Technically successful coil embolization of the distal right gastroepiploic artery at the level of the endoscopic clips along the greater curvature of the stomach, site of known bleeding lesion.     Upper endoscopy 08/02/2014-Dr. Henrene Pastor  EGD [id number] with biopsy of 1.5 central ulcerated lesion in mid body of the stomach.  Intubation 08/02/2014>>> extubation 08/03/2014  Medical Consultants:   PCCM Dr. Elsworth Soho 08/02/2014; 08/08/2014  Gastroenterology: Dr. Henrene Pastor 08/02/2014  Oncology: Dr. Alvy Bimler 08/03/2014  Interventional radiology: Dr. Anselm Pancoast 08/04/2014  Radiation/ oncology: Dr. Tammi Klippel 08/03/2014  Other Consultants:   Physical therapy   IAnti-Infectives:    Rocephin 08/07/2014 -->  Leisa Lenz, MD  [facility] Pager [phone]  If 7PM-7AM, please contact night-coverage [email or web address] Password [email or web address] 08/11/2014, 6:45 PM   LOS: 9 days    HPI/Subjective: No acute overnight events.  Objective: Filed Vitals:   08/10/14 1322 08/10/14 2011 08/11/14 0633 08/11/14 1310  BP: 133/80 139/74 133/74 125/66  Pulse:  79 72 69  Temp: 98.5 F (36.9 C) 99.2 F (37.3 C) 98 F (36.7 C) 98.5 F (36.9 C)  TempSrc:  Oral Oral Oral  Resp: 22 24 20  20  Height: 5\' 7"  (1.702 m)     Weight: 93.895 kg (207 lb)     SpO2: 96% 95% 92% 95%    Intake/Output Summary (Last 24 hours) at 08/11/14 1845 Last data filed at 08/11/14 1816  Gross per 24 hour  Intake 1566.67 ml  Output   1650 ml  Net -83.33 ml    Exam:   General:  Pt is alert, follows commands appropriately, not in acute distress  Cardiovascular: Regular rate and rhythm, S1/S2, no murmurs  Respiratory: Clear to auscultation bilaterally, no wheezing, no crackles, no rhonchi  Abdomen: Soft, non tender, non distended, bowel sounds present  Extremities: No edema, pulses DP and PT palpable bilaterally  Neuro:  Grossly nonfocal  Data Reviewed: Basic Metabolic Panel:  Recent Labs Lab 08/05/14 2308 08/06/14 0400 08/06/14 1340 08/07/14 0512 08/09/14 0405  NA 142 142 138  --  142  K 3.0* 3.2* 4.1  --  3.1*  CL 101 103 101  --  101  CO2 27 28 25   --  30  GLUCOSE 115* 120* 253*  --  128*  BUN 9 10 11   --  20  CREATININE 0.93 0.99 0.98  --  1.43*  CALCIUM 8.1* 8.2* 7.9*  --  8.0*  MG  --  1.7  --  2.2  --    Liver Function Tests:  Recent Labs Lab 08/09/14 0405  AST 27  ALT 10  ALKPHOS 73  BILITOT 0.2*  PROT 5.1*  ALBUMIN 2.1*   No results for input(s): LIPASE, AMYLASE in the last 168 hours. No results for input(s): AMMONIA in the last 168 hours. CBC:  Recent Labs Lab 08/05/14 0425 08/05/14 2308 08/06/14 0400 08/08/14 0600  WBC 7.3 9.6 11.2* 10.0  HGB 9.5* 9.9* 9.9* 9.1*  HCT 27.8* 29.7* 30.8* 28.6*  MCV 91.4 91.7 92.2 94.7  PLT 131* 150 147* 152   Cardiac Enzymes: No results for input(s): CKTOTAL, CKMB, CKMBINDEX, TROPONINI in the last 168 hours. BNP: Invalid input(s): POCBNP CBG:  Recent Labs Lab 08/10/14 1804 08/11/14 0206 08/11/14 0717 08/11/14 1140 08/11/14 1646  GLUCAP 83 124* 115* 159* 94    Recent Results (from the past 240 hour(s))  MRSA PCR Screening     Status: None   Collection Time: 08/02/14  6:17 AM  Result Value Ref Range Status   MRSA by PCR NEGATIVE NEGATIVE Final  Culture, blood (routine x 2)     Status: None (Preliminary result)   Collection Time: 08/07/14  7:09 PM  Result Value Ref Range Status   Specimen Description BLOOD L ARM  Final   Special Requests BOTTLES DRAWN AEROBIC AND ANAEROBIC 8CC  Final   Culture  Setup Time   Final    08/08/2014 03:11 Performed at [facility]    Culture   Final           BLOOD CULTURE RECEIVED NO GROWTH TO DATE CULTURE WILL BE HELD FOR 5 DAYS BEFORE ISSUING A FINAL NEGATIVE REPORT Performed at [facility]    Report Status PENDING  Incomplete  Culture, blood (routine x 2)      Status: None (Preliminary result)   Collection Time: 08/07/14  7:20 PM  Result Value Ref Range Status   Specimen Description BLOOD R ARM  Final   Special Requests BOTTLES DRAWN AEROBIC AND ANAEROBIC 10CC  Final   Culture  Setup Time   Final    08/08/2014 03:11 Performed at [facility]  Culture   Final           BLOOD CULTURE RECEIVED NO GROWTH TO DATE CULTURE WILL BE HELD FOR 5 DAYS BEFORE ISSUING A FINAL NEGATIVE REPORT Performed at [facility]    Report Status PENDING  Incomplete  Urine culture     Status: None   Collection Time: 08/07/14 10:56 PM  Result Value Ref Range Status   Specimen Description URINE, RANDOM  Final   Special Requests NONE  Final   Culture  Setup Time   Final    08/08/2014 10:02 Performed at [facility]   Final    >=100,000 COLONIES/ML Performed at [facility]    Culture   Final    ESCHERICHIA COLI Performed at [facility]    Report Status 08/09/2014 FINAL  Final   Organism ID, Bacteria ESCHERICHIA COLI  Final      Susceptibility   Escherichia coli - MIC*    AMPICILLIN >=32 RESISTANT Resistant     CEFAZOLIN <=4 SENSITIVE Sensitive     CEFTRIAXONE <=1 SENSITIVE Sensitive     CIPROFLOXACIN 1 SENSITIVE Sensitive     GENTAMICIN >=16 RESISTANT Resistant     LEVOFLOXACIN 1 SENSITIVE Sensitive     NITROFURANTOIN <=16 SENSITIVE Sensitive     TOBRAMYCIN 8 INTERMEDIATE Intermediate     TRIMETH/SULFA >=320 RESISTANT Resistant     PIP/TAZO <=4 SENSITIVE Sensitive     * ESCHERICHIA COLI     Scheduled Meds: . antiseptic oral rinse  7 mL Mouth Rinse BID  . atenolol  50 mg Oral Daily  . cefTRIAXone (ROCEPHIN)  IV  1 g Intravenous QHS  . pantoprazole  40 mg Oral BID   Continuous Infusions: . sodium chloride 50 mL/hr at 08/08/14 0137

## 2014-08-11 NOTE — Care Management Note (Addendum)
    Page 1 of 2   08/12/2014     4:17:55 PM CARE MANAGEMENT NOTE 08/12/2014  Patient:  Martin Morris, Martin Morris   Account Number:  [id number]  Date Initiated:  08/02/2014  Documentation initiated by:  DAVIS,RHONDA  Subjective/Objective Assessment:   pt with active upper gi bleed in the presence of metstatic gi cancers, pt was intubated prior to [facility] for airway protection.     Action/Plan:   tbd based on progression   Anticipated DC Date:  08/12/2014   Anticipated DC Plan:  [place] referral  NA      DC Planning Services  CM consult      PAC Choice  NA   Choice offered to / List presented to:  C-1 Patient   DME arranged  NA      DME agency  NA     [place] arranged  HH-1 RN  [place].   Status of service:  Completed, signed off Medicare Important Message given?  YES (If response is "NO", the following Medicare IM given date fields will be blank) Date Medicare IM given:  08/11/2014 Medicare IM given by:  [facility] Date Additional Medicare IM given:   Additional Medicare IM given by:    Discharge Disposition:  [place]  Per UR Regulation:  Reviewed for med. necessity/level of care/duration of stay  If discussed at [place] of Stay Meetings, dates discussed:   08/11/2014    Comments:  08/12/14 Dessa Phi RN BSN NCM [phone] [facility] chosen for [facility].TC Kristen aware of hhc orders, & d/c.  08/11/14 Dessa Phi RN BSN NCM [phone] Transfer from sdu.Patient/spouse pleasantly declines cir/ snf, agree to home w/HHC.Provided spouse w/HHC agency list, await choice.Recommend HHRN/HHPT/HHOT.  [id number] Davis,RN,BSN,CCM:

## 2014-08-11 NOTE — Progress Notes (Signed)
[facility] Progress Note    Since last GI note: EGD yesterday, biopsy of gastric lesion that is suspicious for lymphoma.  This did not bleed any more than is usual for biopsy.  Overnight no overt bleeding  Objective: Vital signs in last 24 hours: Temp:  [97.7 F (36.5 C)-99.2 F (37.3 C)] 98 F (36.7 C) (12/17 5366) Pulse Rate:  [69-79] 72 (12/17 0633) Resp:  [19-24] 20 (12/17 0633) BP: (113-146)/(60-80) 133/74 mmHg (12/17 0633) SpO2:  [92 %-96 %] 92 % (12/17 4403) Weight:  [207 lb (93.895 kg)] 207 lb (93.895 kg) (12/16 1322) Last BM Date: 08/10/14 General: alert and oriented times 3 Heart: regular rate and rythm Abdomen: soft, non-tender, non-distended, normal bowel sounds   Lab Results: No results for input(s): WBC, HGB, PLT, MCV in the last 72 hours.  Recent Labs  08/09/14 0405  NA 142  K 3.1*  CL 101  CO2 30  GLUCOSE 128*  BUN 20  CREATININE 1.43*  CALCIUM 8.0*    Recent Labs  08/09/14 0405  PROT 5.1*  ALBUMIN 2.1*  AST 27  ALT 10  ALKPHOS 73  BILITOT 0.2*   No results for input(s): INR in the last 72 hours.   Studies/Results: No results found.   Medications: Scheduled Meds: . antiseptic oral rinse  7 mL Mouth Rinse BID  . atenolol  50 mg Oral Daily  . cefTRIAXone (ROCEPHIN)  IV  1 g Intravenous QHS  . pantoprazole (PROTONIX) IV  40 mg Intravenous Q12H   Continuous Infusions: . sodium chloride 50 mL/hr at 08/08/14 0137   PRN Meds:.acetaminophen **OR** acetaminophen, diphenhydrAMINE-zinc acetate, hydrALAZINE, HYDROcodone-acetaminophen, nitroGLYCERIN, ondansetron **OR** ondansetron (ZOFRAN) IV    Assessment/Plan: 80 y.o. male with gastric lesion, possible lymphoma  Biopsied yesterday.  Await final pathology.  Please call if any further questions or concerns.  Will change his IV ppi to oral twice daily.    Martin Banister, MD  08/11/2014, 7:37 AM [facility] Pager [phone]

## 2014-08-12 LAB — CBC
HEMATOCRIT: 30.2 % — AB (ref 39.0–52.0)
HEMOGLOBIN: 9.5 g/dL — AB (ref 13.0–17.0)
MCH: 29.4 pg (ref 26.0–34.0)
MCHC: 31.5 g/dL (ref 30.0–36.0)
MCV: 93.5 fL (ref 78.0–100.0)
Platelets: 250 10*3/uL (ref 150–400)
RBC: 3.23 MIL/uL — AB (ref 4.22–5.81)
RDW: 16.6 % — ABNORMAL HIGH (ref 11.5–15.5)
WBC: 6.8 10*3/uL (ref 4.0–10.5)

## 2014-08-12 LAB — BASIC METABOLIC PANEL
Anion gap: 11 (ref 5–15)
BUN: 14 mg/dL (ref 6–23)
CHLORIDE: 101 meq/L (ref 96–112)
CO2: 29 meq/L (ref 19–32)
Calcium: 8.3 mg/dL — ABNORMAL LOW (ref 8.4–10.5)
Creatinine, Ser: 1.07 mg/dL (ref 0.50–1.35)
GFR calc Af Amer: 74 mL/min — ABNORMAL LOW (ref >90)
GFR calc non Af Amer: 64 mL/min — ABNORMAL LOW (ref >90)
Glucose, Bld: 126 mg/dL — ABNORMAL HIGH (ref 70–99)
POTASSIUM: 3.3 meq/L — AB (ref 3.7–5.3)
Sodium: 141 mEq/L (ref 137–147)

## 2014-08-12 LAB — GLUCOSE, CAPILLARY: GLUCOSE-CAPILLARY: 121 mg/dL — AB (ref 70–99)

## 2014-08-12 MED ORDER — SODIUM CHLORIDE 0.9 % IJ SOLN
10.0000 mL | INTRAMUSCULAR | Status: DC | PRN
  Administered 2014-08-12: 10 mL

## 2014-08-12 MED ORDER — CITALOPRAM HYDROBROMIDE 20 MG PO TABS: ORAL_TABLET | ORAL | Status: DC

## 2014-08-12 MED ORDER — PANTOPRAZOLE SODIUM 40 MG PO TBEC: 40.0000 mg | DELAYED_RELEASE_TABLET | Freq: Two times a day (BID) | ORAL | Status: DC

## 2014-08-12 MED ORDER — HEPARIN SOD (PORK) LOCK FLUSH 100 UNIT/ML IV SOLN
500.0000 [IU] | INTRAVENOUS | Status: AC | PRN
Start: 2014-08-12 — End: 2014-08-12
  Administered 2014-08-12: 500 [IU]

## 2014-08-12 NOTE — Discharge Summary (Signed)
Physician Discharge Summary  Martin Morris ENI:[id number] DOB: Nov 12, 1932 DOA: 08/02/2014  PCP: [name], MD  Admit date: 08/02/2014 Discharge date: 08/12/2014   Recommendations for Outpatient Follow-Up:   1. Home health PT, RN and home health aide set up at discharge.   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  Diet recommendation: Low sodium, heart healthy.  Carbohydrate-modified.  Regular.   History of Present Illness with general summary of hospital course:   80 year old male with past medical history of mantle cell lymphoma (diagnosed in 10/2006) undergoing active chemotherapy (under Dr. Calton Dach care), most recently has had PET scan 07/27/14 which showed diffuse progression of the disease most obvious in fundus of stomach, as well as descending colon and cecum. Patient also has a PMH of CAD, PVD, dyslipidemia, chronic kidney disease stage II, anemia of chronic disease, sleep apnea.  Patient presented to [facility] 08/02/2014 with acute GI bleed. Patient was evaluated by GI. He was intubated for EGD which revealed malignant gastric ulcer status post endoclipping and resolution of bleeding. Please note patient received PPI gtt and blood transfusion as well. PCCM consulted for syncope and bradycardia following hematemesis. Additionally, IR consulted for elective embolization. Patinet is status post mesenteric angiogram with coil embolization  of the right gastroepiploic artery 08/05/2014.   Radiation oncology saw the patient in consultation for input on palliative radiotherapy to lymphoma. Due to acute illness the recommendation is for outpatient follow-up.   Additionally, MRI of the brain was done to evaluate the possibility of intracranial metastases seen on CT head. MRI did not reveal metastatic disease in the brain but it did show acute posterior circulation infarct in the left cerebellum and right occipital lobe. Neurology was consulted for input on management. Patient is not on aspirin and Plavix because of risk of bleeding.  Of note, on 08/07/2014 patient went into respiratory distress, was lethargic and had a fever. Fever workup obtained. He required BiPAP to keep oxygen saturations above 90% and was subsequently transfered to stepdown unit. He was found to have clearing pulmonary edema. He was given a small bolus of Lasix IV. He was started on Rocephin for urinary tract infection which was evident on urinalysis. Blood cultures to date are negative. Urine culture is pending at this time. Critical care medicine was consulted the following morning on 08/08/2014 to be on standby should patient require intubation. Patient's respiratory status continues to be stable off BiPAP.  Most recently, patient underwent EGD, 08/10/2014 with biopsy of the 1.5 cm central ulcerated lesion in the mid body of the stomach, which showed mantle cell lymphoma.   Although the patient was recommended to be discharged to rehabilitation, he declined.  Hospital Course by problem:   Principal Problem: Acute hemorrhagic shock / upper GI bleed / acute blood loss anemia  - Secondary to upper GI bleed secondary to malignant proximal gastric ulcer  as seen on EGD 08/02/2014. - Patient is status post Endo Clip 3. Also, status post mesenteric arteriogram with coil embolization of the right gastroepiploic artery done by IR 08/05/2014. - Patient status post 4 unit  packed red blood cells and 1 unit platelets(08/02/14) transfusion.  - Plavix and Asprin on hold due to risk of bleeding. Patient was recommended that he follow-up with his cardiologist regarding safety of resuming these medications at some point in the future. - Discharged home on oral protonix Q 12 hours.  - Patient hemodynamically stable and tolerating a soft diet at discharge.  Active Problems: Acute respiratory failure with hypoxia / pulmonary edema - Chest x-ray on 08/08/2014 showed clearing of pulmonary edema. Patient received 1 dose of Lasix overnight. He was moved to stepdown unit because he required BiPAP. - Critical care medicine consulted on 08/08/2014 should patient require intubation. Fortunately, he started to improve and his respiratory status stabilized.  Urinary tract infection secondary to Escherichia coli - Patient febrile 08/07/2014. Urinalysis with evidence of infection. Patient started on Rocephin. - Urine culture grew Escherichia coli sensitive to Rocephin. He completed a 5 day course of antibiotics.  Malignant proximal gastric ulcer - Status post Endo Clip 3 08/02/2014. Status post mesenteric arteriogram with coil embolization right gastroepiploic artery per interventional radiology 08/05/2014.  - Radiation oncology has been consulted and saw patient 08/03/2014 for possible radiation therapy. Rad oncology re-evaluated the patient 08/08/2014 for palliative gastric radiation. This will likely take place outpatient due to multiple acute medical issues at this time.  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 He receives Rituxan every alternate cycle. - Last chemotherapy was given on 07/11/14, cycle #81 - CT of the head showed possible early metastatic disease. - MRI brain with contrast did not show evidence of intracranial metastasis. - Please note that the case was reviewed at  the hematology tumor board and overall consensus was to proceed with either biopsy of the stomach mass or cecal mass. Dr. Henrene Pastor and Dr. Ardis Hughs were informed. Dr. Ardis Hughs has done EGD and biopsy of central ulcerated lesion in the body of the stomach 08/10/2014. Biopsy confirmed recurrence of mantle cell lymphoma, the current guidelines suggest treatment with Ibrutinib, which can be followed up on as an outpatient.  Acute CVA - MRI of the brain done 08/08/2014 with findings of punctate, acute posterior circulation infarct involving the left cerebellum and right occipital lobe. Patient is off of aspirin and Plavix due to risk of bleeding. - Appreciate neurology consult and recommendations. For now, neurology recommends holding off on aspirin and Plavix.  Thrombocytopenia, mild - Due to acute bleed, malignancy.  - Patient received 1 unit of platelets on 12/8 due to bleeding.  Chronic kidney disease (CKD), stage II (mild) - This is chronic in nature, unlikely related to his cancer.  - Baseline is 1.4. Creatinine normalized over a period of time but then this morning a bump to 1.43 noted which is likely because of Lasix given for pulmonary edema.  Hypokalemia - Likely secondary to recent Lasix given for pulmonary edema on 08/07/2014 - Supplemented.    Symptomatic bradycardia in the setting of syncopal event - Likely secondary to hemorrhagic shock. Antihypertensives on hold. Aspirin and Plavix on hold.  - Resolved.  Acute encephalopathy - Likely secondary to syncopal event secondary to hemorrhagic shock - Mental status at baseline, alert and oriented to time, place and person  Essential hypertension -Okay to resume  antihypertensives at discharge.    Medical Consultants:    PCCM Dr. Elsworth Soho 08/02/2014; 08/08/2014  Gastroenterology: Dr. Henrene Pastor 08/02/2014  Oncology: Dr. Alvy Bimler 08/03/2014  Interventional radiology: Dr. Anselm Pancoast 08/04/2014  Radiation/ oncology: Dr. Tammi Klippel  08/03/2014   Discharge Exam:   Filed Vitals:   08/12/14 1351  BP: 129/63  Pulse: 61  Temp: 98.2 F (36.8 C)  Resp: 20   Filed Vitals:   08/11/14 1310 08/11/14 2133 08/12/14 0655 08/12/14 1351  BP: 125/66 139/73 143/74 129/63  Pulse: 69 66 65 61  Temp: 98.5 F (36.9 C) 97.7 F (36.5 C) 97.9 F (36.6 C) 98.2 F (36.8 C)  TempSrc: Oral Oral Oral Oral  Resp: 20 20 20 20   Height:      Weight:      SpO2: 95% 95% 98%     Gen:  NAD Cardiovascular:  RRR, No M/R/G Respiratory: Lungs CTAB Gastrointestinal: Abdomen soft, NT/ND with normal active bowel sounds. Extremities: No C/E/C   The results of significant diagnostics from this hospitalization (including imaging, microbiology, ancillary and laboratory) are listed below for reference.     Procedures and Diagnostic Studies:      Mr Kizzie Fantasia Contrast 08/08/2014 1. Punctate, acute posterior circulation infarcts involving the left cerebellum and right occipital lobe. 2. No evidence of intracranial metastases. 3. Moderate chronic small vessel ischemic disease and cerebral atrophy. Chronic infarcts as above. 4. Asymmetric, nodular soft tissue in the left nasopharynx, concerning for malignancy, specifically involvement by patient's known lymphoma, as described on PET-CT. Electronically Signed By: Logan Bores On: 08/08/2014 15:19   Ir Angiogram Visceral Selective 08/06/2014 1. Technically successful coil embolization of the distal right gastroepiploic artery at the level of the endoscopic clips along the greater curvature of the stomach, site of known bleeding lesion. Electronically Signed By: Arne Cleveland M.D. On: 08/06/2014 09:17   Ir Angiogram Visceral Selective 08/06/2014 1. Technically successful coil embolization of the distal right gastroepiploic artery at the level of the endoscopic clips along the greater curvature of the stomach, site of known bleeding lesion. Electronically Signed By: Arne Cleveland M.D. On: 08/06/2014 09:17   Ir Angiogram Visceral Selective 08/06/2014 1. Technically successful coil embolization of the distal right gastroepiploic artery at the level of the endoscopic clips along the greater curvature of the stomach, site of known bleeding lesion. Electronically Signed By: Arne Cleveland M.D. On: 08/06/2014 09:17   Ir Angiogram Selective Each Additional Vessel 08/06/2014 1. Technically successful coil embolization of the distal right gastroepiploic artery at the level of the endoscopic clips along the greater curvature of the stomach, site of known bleeding lesion. Electronically Signed By: Arne Cleveland M.D. On: 08/06/2014 09:17   Ir Angiogram Selective Each Additional Vessel 08/06/2014 1. Technically successful coil embolization of the distal right gastroepiploic artery at the level of the endoscopic clips along the greater curvature of the stomach, site of known bleeding lesion. Electronically Signed By: Arne Cleveland M.D. On: 08/06/2014 09:17   Ir US Guide Vasc Access Right 08/06/2014 1. Technically successful coil embolization of the distal right gastroepiploic artery at the level of the endoscopic clips along the greater curvature of the stomach, site of known bleeding lesion. Electronically Signed By: Arne Cleveland M.D. On: 08/06/2014 09:17   Dg Chest Port 1 View 08/08/2014 Clearing pulmonary edema. Electronically Signed By: Jorje Guild M.D. On: 08/08/2014 05:16   Dg Chest Port 1 View 08/07/2014 Findings suggest congestive heart failure with mild interstitial pulmonary edema Electronically Signed By: Skipper Cliche M.D. On: 08/07/2014 18:44  [place] Arterial Not Gu-Win 08/06/2014 1. Technically successful coil embolization of the distal right gastroepiploic artery at the level of the endoscopic clips along the greater curvature of the stomach, site of known bleeding lesion.    Upper endoscopy 08/02/2014-Dr. Henrene Pastor  EGD [id number] with biopsy of 1.5 central ulcerated lesion in mid body of the stomach.  Intubation 08/02/2014>>> extubation 08/03/2014    Labs:   Basic Metabolic Panel:  Recent Labs Lab 08/05/14 2308 08/06/14 0400 08/06/14 1340 08/07/14 0512 08/09/14 0405 08/12/14 0400  NA 142 142 138  --  142 141  K 3.0* 3.2* 4.1  --  3.1* 3.3*  CL 101 103 101  --  101 101  CO2 27 28 25   --  30 29  GLUCOSE 115* 120* 253*  --  128* 126*  BUN 9 10 11   --  20 14  CREATININE 0.93 0.99 0.98  --  1.43* 1.07  CALCIUM 8.1* 8.2* 7.9*  --  8.0* 8.3*  MG  --  1.7  --  2.2  --   --    GFR Estimated Creatinine Clearance: 61.1 mL/min (by C-G formula based on Cr of 1.07). Liver Function Tests:  Recent Labs Lab 08/09/14 0405  AST 27  ALT 10  ALKPHOS 73  BILITOT 0.2*  PROT 5.1*  ALBUMIN 2.1*   CBC:  Recent Labs Lab 08/05/14 2308 08/06/14 0400 08/08/14 0600 08/12/14 0400  WBC 9.6 11.2* 10.0 6.8  HGB 9.9* 9.9* 9.1* 9.5*  HCT 29.7* 30.8* 28.6* 30.2*  MCV 91.7 92.2 94.7 93.5  PLT 150 147* 152 250   CBG:  Recent Labs Lab 08/11/14 0206 08/11/14 0717 08/11/14 1140 08/11/14 1646 08/11/14 2213  GLUCAP 124* 115* 159* 94 140*    Hgb A1c  Recent Labs  08/10/14 1036  HGBA1C 5.9*   Lipid Profile  Recent Labs  08/10/14 1036  CHOL 125  HDL 12*  LDLCALC 51  TRIG 311*  CHOLHDL 10.4   Microbiology Recent Results (from the past 240 hour(s))  Culture, blood (routine x 2)     Status: None (Preliminary result)   Collection Time: 08/07/14  7:09 PM  Result Value Ref Range Status   Specimen Description BLOOD L ARM  Final   Special Requests BOTTLES DRAWN AEROBIC AND ANAEROBIC 8CC  Final   Culture  Setup Time   Final    08/08/2014 03:11 Performed at [facility]    Culture   Final           BLOOD CULTURE RECEIVED NO GROWTH TO DATE CULTURE WILL BE HELD FOR 5 DAYS BEFORE ISSUING A FINAL NEGATIVE REPORT Performed at [facility]    Report Status PENDING  Incomplete  Culture, blood (routine x 2)     Status: None (Preliminary result)   Collection Time: 08/07/14  7:20 PM  Result Value Ref Range Status   Specimen Description BLOOD R ARM  Final   Special Requests BOTTLES DRAWN AEROBIC AND ANAEROBIC 10CC  Final   Culture  Setup Time   Final    08/08/2014 03:11 Performed at [facility]    Culture   Final           BLOOD CULTURE RECEIVED NO GROWTH TO DATE CULTURE WILL BE HELD FOR 5 DAYS BEFORE ISSUING A FINAL NEGATIVE REPORT Performed at [facility]    Report Status PENDING  Incomplete  Urine culture     Status: None   Collection Time:  08/07/14 10:56 PM  Result Value Ref Range Status   Specimen Description URINE, RANDOM  Final   Special Requests NONE  Final   Culture  Setup Time   Final    08/08/2014 10:02 Performed at [place]   Final    >=100,000 COLONIES/ML Performed at [facility]    Culture   Final    ESCHERICHIA COLI Performed at [facility]    Report Status 08/09/2014 FINAL  Final   Organism ID, Bacteria ESCHERICHIA COLI  Final      Susceptibility   Escherichia coli - MIC*    AMPICILLIN >=32 RESISTANT Resistant     CEFAZOLIN <=4 SENSITIVE Sensitive     CEFTRIAXONE <=1 SENSITIVE Sensitive     CIPROFLOXACIN 1 SENSITIVE Sensitive     GENTAMICIN >=16 RESISTANT Resistant     LEVOFLOXACIN 1 SENSITIVE Sensitive     NITROFURANTOIN <=16 SENSITIVE Sensitive     TOBRAMYCIN 8 INTERMEDIATE Intermediate     TRIMETH/SULFA >=320 RESISTANT Resistant     PIP/TAZO <=4 SENSITIVE Sensitive     * ESCHERICHIA COLI     Discharge Instructions:   Discharge Instructions    Call MD for:  extreme fatigue    Complete by:  As directed      Call MD for:  persistant nausea and vomiting    Complete by:  As directed      Call MD for:  severe uncontrolled pain    Complete by:  As directed      Call MD for:  temperature >100.4    Complete by:  As  directed      Diet - low sodium heart healthy    Complete by:  As directed      Discharge instructions    Complete by:  As directed   You were cared for by Dr. Jacquelynn Cree  (a hospitalist) during your hospital stay. If you have any questions about your discharge medications or the care you received while you were in the hospital after you are discharged, you can call the unit and ask to speak with the hospitalist on call if the hospitalist that took care of you is not available. Once you are discharged, your primary care physician will handle any further medical issues. Please note that NO REFILLS for any discharge medications will be authorized once you are discharged, as it is imperative that you return to your primary care physician (or establish a relationship with a primary care physician if you do not have one) for your aftercare needs so that they can reassess your need for medications and monitor your lab values.  Any outstanding tests can be reviewed by your PCP at your follow up visit.  It is also important to review any medicine changes with your PCP.  Please bring these d/c instructions with you to your next visit so your physician can review these changes with you.     Face-to-face encounter (required for Medicare/Medicaid patients)    Complete by:  As directed   I RAMA,CHRISTINA certify that this patient is under my care and that I, or a nurse practitioner or physician's assistant working with me, had a face-to-face encounter that meets the physician face-to-face encounter requirements with this patient on 08/12/2014. The encounter with the patient was in whole, or in part for the following medical condition(s) which is the primary reason for home health care (List medical condition): Prolonged hospitalization from GIB related to mantle  cell lymphoma. Deconditioned needing 2+ assist.  Refused rehab.  High risk for re-hospitalization.  The encounter with the patient was in whole, or in part,  for the following medical condition, which is the primary reason for home health care:  Complicated, prolonged hospitalization from GIB and mantle cell lymphoma  I certify that, based on my findings, the following services are medically necessary home health services:   NursingPhysical therapy    Reason for Medically Necessary Home Health Services:   Skilled Nursing- Skilled Assessment/ObservationSkilled Nursing- Teaching of Disease Process/Symptom ManagementTherapy- Home Adaptation to Facilitate SafetyTherapy- Instruction on Safe use of Assistive Devices for ADLsTherapy- Therapeutic Exercises to Increase Strength and Endurance    My clinical findings support the need for the above services:  Unable to leave home safely without assistance and/or assistive device  Further, I certify that my clinical findings support that this patient is homebound due to:  Unable to leave home safely without assistance     Home Health    Complete by:  As directed   To provide the following care/treatments:   PTRNH[facility] Health Aide       Increase activity slowly    Complete by:  As directed      Walk with assistance    Complete by:  As directed      Walker     Complete by:  As directed             Medication List    STOP taking these medications        aspirin 81 MG tablet     clopidogrel 75 MG tablet  Commonly known as:  PLAVIX      TAKE these medications        acyclovir 400 MG tablet  Commonly known as:  ZOVIRAX  TAKE ONE TABLET BY MOUTH TWICE DAILY     amLODipine 5 MG tablet  Commonly known as:  NORVASC  Take 1 tablet (5 mg total) by mouth daily.     atenolol 50 MG tablet  Commonly known as:  TENORMIN  Take 1 tablet (50 mg total) by mouth daily.     citalopram 20 MG tablet  Commonly known as:  CELEXA  TAKE ONE TABLET BY MOUTH ONCE DAILY     furosemide 20 MG tablet  Commonly known as:  LASIX  Take one daily as needed for fluid     HYDROcodone-acetaminophen 5-325 MG per tablet   Commonly known as:  NORCO/VICODIN  Take 1-2 tablets by mouth every 6 (six) hours as needed for moderate pain.     isosorbide mononitrate 120 MG 24 hr tablet  Commonly known as:  IMDUR  Take 1 tablet (120 mg total) by mouth daily.     lactose free nutrition Liqd  Take 237 mLs by mouth 2 (two) times daily as needed (Allow w/pt or pt's wife request after diet advancement).     lidocaine-prilocaine cream  Commonly known as:  EMLA  Apply 1 application topically See admin instructions. Apply to port 1 hour before treatment. Cover site with plastic wrap     methocarbamol 500 MG tablet  Commonly known as:  ROBAXIN  Take 1 tablet (500 mg total) by mouth every 6 (six) hours as needed for muscle spasms.     nitroGLYCERIN 0.4 MG SL tablet  Commonly known as:  NITROSTAT  Place 1 tablet (0.4 mg total) under the tongue every 5 (five) minutes as needed for chest pain.     pantoprazole 40 MG  tablet  Commonly known as:  PROTONIX  Take 1 tablet (40 mg total) by mouth 2 (two) times daily.     pravastatin 80 MG tablet  Commonly known as:  PRAVACHOL  Take 1 tablet (80 mg total) by mouth daily.     PRESCRIPTION MEDICATION  Inject into the vein every 21 ( twenty-one) days. Velcade and Cytoxan.     sulfamethoxazole-trimethoprim 800-160 MG per tablet  Commonly known as:  BACTRIM DS,SEPTRA DS  Take 1 tablet by mouth every Monday, Wednesday, and Friday.           Follow-up Information    Follow up with Rigoberto Noel., MD On 09/19/2014.   Specialty:  Pulmonary Disease   Why:  230pm    Contact information:   [address] [place] [address] [phone]       Follow up with [facility], NI, MD.   Specialty:  Hematology and Oncology   Why:  At your scheduled appt time.   Contact information:   [place] [address] [phone]       Follow up with [name], MD.   Specialty:  Family Medicine   Why:  As needed   Contact information:   [place] [address] [phone]       Follow up with Minus Breeding, MD In 2 weeks.   Specialty:  Cardiology   Why:  For advice regarding when/if to go back on aspirin/plavix.   Contact information:   [address] [place] [address] [phone]        Time coordinating discharge: 45 minutes.  Signed:  RAMA,CHRISTINA  Pager [phone] [facility] 08/12/2014, 6:20 PM

## 2014-08-12 NOTE — Progress Notes (Signed)
Physical Therapy Treatment Patient Details Name: SAMEL BRUNA MRN: [id number] DOB: 15-Jun-1933 Today's Date: 08/12/2014    History of Present Illness 80 yo male admitted with hemorrhagic shock. s/p mesenteric arteriogram, coil 08/05/14 by IR. MRI of the brain was done to evaluate the possibility of intracranial metastases seen on CT head. 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 Wife states plan is for home possibly today.   Follow Up Recommendations  Home health PT;Supervision/Assistance - 24 hour (pt and wife declined CIR)     Equipment Recommendations  Rolling walker with 5" wheels    Recommendations for Other Services       Precautions / Restrictions Precautions Precautions: Fall Restrictions Weight Bearing Restrictions: No    Mobility  Bed Mobility               General bed mobility comments: pt OOB in recliner  Transfers Overall transfer level: Needs assistance Equipment used: Rolling walker (2 wheeled) Transfers: Sit to/from Stand Sit to Stand: Min assist         General transfer comment: Assist to rise, stabilize, control descent. VCs safety, technique, hand placement  Ambulation/Gait Ambulation/Gait assistance: Min assist Ambulation Distance (Feet): 160 Feet Assistive device: Rolling walker (2 wheeled) Gait Pattern/deviations: Step-through pattern;Trunk flexed;Decreased stride length     General Gait Details: Assist to stabilize throughout ambulation. VCS safety, posture, distance from walker. [place].   Stairs            Wheelchair Mobility    Modified Rankin (Stroke Patients Only)       Balance                                    Cognition Arousal/Alertness:  Awake/alert Behavior During Therapy: Flat affect Overall Cognitive Status: Within Functional Limits for tasks assessed                      Exercises General Exercises - Lower Extremity Ankle Circles/Pumps: AROM;Both;10 reps;Seated Quad Sets: AROM;Both;10 reps;Seated Long Arc Quad: AROM;Both;10 reps;Seated Hip ABduction/ADduction: AAROM;Both;10 reps;Seated    General Comments        Pertinent Vitals/Pain Pain Assessment: No/denies pain    Home Living                      Prior Function            PT Goals (current goals can now be found in the care plan section) Progress towards PT goals: Progressing toward goals    Frequency  Min 3X/week    PT Plan Current plan remains appropriate    Co-evaluation             End of Session Equipment Utilized During Treatment: Gait belt Activity Tolerance: Patient tolerated treatment well Patient left: in chair;with call bell/phone within reach;with family/visitor present     Time: 8563-1497 PT Time Calculation (min) (ACUTE ONLY): 25 min  Charges:  $Gait Training: 8-22 mins $Therapeutic Exercise: 8-22 mins                    G Codes:      Weston Anna, MPT Pager: [phone]

## 2014-08-12 NOTE — Discharge Instructions (Signed)
Bloody Stools  Bloody stools often mean that there is a problem in the digestive tract. Your caregiver may use the term "melena" to describe black, tarry, and bad smelling stools or "hematochezia" to describe red or maroon-colored stools. Blood seen in the stool can be caused by bleeding anywhere along the intestinal tract.   A black stool usually means that blood is coming from the upper part of the gastrointestinal tract (esophagus, stomach, or small bowel). Passing maroon-colored stools or bright red blood usually means that blood is coming from lower down in the large bowel or the rectum. However, sometimes massive bleeding in the stomach or small intestine can cause bright red bloody stools.   Consuming black licorice, lead, iron pills, medicines containing bismuth subsalicylate, or blueberries can also cause black stools. Your caregiver can test black stools to see if blood is present.  It is important that the cause of the bleeding be found. Treatment can then be started, and the problem can be corrected. Rectal bleeding may not be serious, but you should not assume everything is okay until you know the cause. It is very important to follow up with your caregiver or a specialist in gastrointestinal problems.  CAUSES   Blood in the stools can come from various underlying causes. Often, the cause is not found during your first visit. Testing is often needed to discover the cause of bleeding in the gastrointestinal tract. Causes range from simple to serious or even life-threatening. Possible causes include:  · Hemorrhoids. These are veins that are full of blood (engorged) in the rectum. They cause pain, inflammation, and may bleed.  · Anal fissures. These are areas of painful tearing which may bleed. They are often caused by passing hard stool.  · Diverticulosis. These are pouches that form on the colon over time, with age, and may bleed significantly.  · Diverticulitis. This is inflammation in areas with  diverticulosis. It can cause pain, fever, and bloody stools, although bleeding is rare.  · Proctitis and colitis. These are inflamed areas of the rectum or colon. They may cause pain, fever, and bloody stools.  · Polyps and cancer. Colon cancer is a leading cause of preventable cancer death. It often starts out as precancerous polyps that can be removed during a colonoscopy, preventing progression into cancer. Sometimes, polyps and cancer may cause rectal bleeding.  · Gastritis and ulcers. Bleeding from the upper gastrointestinal tract (near the stomach) may travel through the intestines and produce black, sometimes tarry, often bad smelling stools. In certain cases, if the bleeding is fast enough, the stools may not be black, but red and the condition may be life-threatening.  SYMPTOMS   You may have stools that are bright red and bloody, that are normal color with blood on them, or that are dark black and tarry. In some cases, you may only have blood in the toilet bowl. Any of these cases need medical care. You may also have:  · Pain at the anus or anywhere in the rectum.  · Lightheadedness or feeling faint.  · Extreme weakness.  · Nausea or vomiting.  · Fever.  DIAGNOSIS  Your caregiver may use the following methods to find the cause of your bleeding:  · Taking a medical history. Age is important. Older people tend to develop polyps and cancer more often. If there is anal pain and a hard, large stool associated with bleeding, a tear of the anus may be the cause. If blood drips into the toilet after a bowel movement, bleeding hemorrhoids may be the   problem. The color and frequency of the bleeding are additional considerations. In most cases, the medical history provides clues, but seldom the final answer.  · A visual and finger (digital) exam. Your caregiver will inspect the anal area, looking for tears and hemorrhoids. A finger exam can provide information when there is tenderness or a growth inside. In men, the  prostate is also examined.  · Endoscopy. Several types of small, long scopes (endoscopes) are used to view the colon.  ¨ In the office, your caregiver may use a rigid, or more commonly, a flexible viewing sigmoidoscope. This exam is called flexible sigmoidoscopy. It is performed in 5 to 10 minutes.  ¨ A more thorough exam is accomplished with a colonoscope. It allows your caregiver to view the entire 5 to 6 foot long colon. Medicine to help you relax (sedative) is usually given for this exam. Frequently, a bleeding lesion may be present beyond the reach of the sigmoidoscope. So, a colonoscopy may be the best exam to start with. Both exams are usually done on an outpatient basis. This means the patient does not stay overnight in the hospital or surgery center.  ¨ An upper endoscopy may be needed to examine your stomach. Sedation is used and a flexible endoscope is put in your mouth, down to your stomach.  · A barium enema X-ray. This is an X-ray exam. It uses liquid barium inserted by enema into the rectum. This test alone may not identify an actual bleeding point. X-rays highlight abnormal shadows, such as those made by lumps (tumors), diverticuli, or colitis.  TREATMENT   Treatment depends on the cause of your bleeding.   · For bleeding from the stomach or colon, the caregiver doing your endoscopy or colonoscopy may be able to stop the bleeding as part of the procedure.  · Inflammation or infection of the colon can be treated with medicines.  · Many rectal problems can be treated with creams, suppositories, or warm baths.  · Surgery is sometimes needed.  · Blood transfusions are sometimes needed if you have lost a lot of blood.  · For any bleeding problem, let your caregiver know if you take aspirin or other blood thinners regularly.  HOME CARE INSTRUCTIONS   · Take any medicines exactly as prescribed.  · Keep your stools soft by eating a diet high in fiber. Prunes (1 to 3 a day) work well for many people.  · Drink  enough water and fluids to keep your urine clear or pale yellow.  · Take sitz baths if advised. A sitz bath is when you sit in a bathtub with warm water for 10 to 15 minutes to soak, soothe, and cleanse the rectal area.  · If enemas or suppositories are advised, be sure you know how to use them. Tell your caregiver if you have problems with this.  · Monitor your bowel movements to look for signs of improvement or worsening.  SEEK MEDICAL CARE IF:   · You do not improve in the time expected.  · Your condition worsens after initial improvement.  · You develop any new symptoms.  SEEK IMMEDIATE MEDICAL CARE IF:   · You develop severe or prolonged rectal bleeding.  · You vomit blood.  · You feel weak or faint.  · You have a fever.  MAKE SURE YOU:  · Understand these instructions.  · Will watch your condition.  · Will get help right away if you are not doing well or get worse.    Document Released: 08/02/2002 Document Revised: 11/04/2011 Document Reviewed: 12/28/2010  [employer]® Patient Information ©2015 [employer]. This information is not intended to replace advice given to you by your health care provider. Make sure you discuss any questions you have with your health care provider.

## 2014-08-14 LAB — CULTURE, BLOOD (ROUTINE X 2)
CULTURE: NO GROWTH
Culture: NO GROWTH

## 2014-08-15 LAB — GLUCOSE, CAPILLARY
Glucose-Capillary: 72 mg/dL (ref 70–99)
Glucose-Capillary: 124 mg/dL — ABNORMAL HIGH (ref 70–99)

## 2014-08-23 ENCOUNTER — Other Ambulatory Visit (HOSPITAL_BASED_OUTPATIENT_CLINIC_OR_DEPARTMENT_OTHER): Payer: Medicare Other

## 2014-08-23 ENCOUNTER — Encounter: Payer: Self-pay | Admitting: Hematology and Oncology

## 2014-08-23 ENCOUNTER — Ambulatory Visit (HOSPITAL_BASED_OUTPATIENT_CLINIC_OR_DEPARTMENT_OTHER): Payer: Medicare Other | Admitting: Hematology and Oncology

## 2014-08-23 ENCOUNTER — Telehealth: Payer: Self-pay | Admitting: Hematology and Oncology

## 2014-08-23 VITALS — BP 140/72 | HR 68 | Temp 98.0°F | Resp 18 | Ht 67.0 in | Wt 209.5 lb

## 2014-08-23 DIAGNOSIS — D630 Anemia in neoplastic disease: Secondary | ICD-10-CM

## 2014-08-23 DIAGNOSIS — R609 Edema, unspecified: Secondary | ICD-10-CM

## 2014-08-23 DIAGNOSIS — C8313 Mantle cell lymphoma, intra-abdominal lymph nodes: Secondary | ICD-10-CM

## 2014-08-23 DIAGNOSIS — C8310 Mantle cell lymphoma, unspecified site: Secondary | ICD-10-CM

## 2014-08-23 DIAGNOSIS — N182 Chronic kidney disease, stage 2 (mild): Secondary | ICD-10-CM

## 2014-08-23 LAB — COMPREHENSIVE METABOLIC PANEL (CC13)
ALBUMIN: 3.2 g/dL — AB (ref 3.5–5.0)
ALK PHOS: 127 U/L (ref 40–150)
ALT: 12 U/L (ref 0–55)
AST: 19 U/L (ref 5–34)
Anion Gap: 11 mEq/L (ref 3–11)
BILIRUBIN TOTAL: 0.69 mg/dL (ref 0.20–1.20)
BUN: 11.3 mg/dL (ref 7.0–26.0)
CO2: 28 meq/L (ref 22–29)
Calcium: 8.8 mg/dL (ref 8.4–10.4)
Chloride: 105 mEq/L (ref 98–109)
Creatinine: 1.3 mg/dL (ref 0.7–1.3)
EGFR: 52 mL/min/{1.73_m2} — AB (ref >90)
Glucose: 92 mg/dl (ref 70–140)
Potassium: 4.6 mEq/L (ref 3.5–5.1)
Sodium: 144 mEq/L (ref 136–145)
Total Protein: 5.9 g/dL — ABNORMAL LOW (ref 6.4–8.3)

## 2014-08-23 LAB — CBC WITH DIFFERENTIAL/PLATELET
BASO%: 0.6 % (ref 0.0–2.0)
Basophils Absolute: 0 10*3/uL (ref 0.0–0.1)
EOS ABS: 0.4 10*3/uL (ref 0.0–0.5)
EOS%: 6 % (ref 0.0–7.0)
HEMATOCRIT: 32 % — AB (ref 38.4–49.9)
HGB: 10.1 g/dL — ABNORMAL LOW (ref 13.0–17.1)
LYMPH#: 2.1 10*3/uL (ref 0.9–3.3)
LYMPH%: 31.7 % (ref 14.0–49.0)
MCH: 29.7 pg (ref 27.2–33.4)
MCHC: 31.7 g/dL — ABNORMAL LOW (ref 32.0–36.0)
MCV: 93.8 fL (ref 79.3–98.0)
MONO#: 0.7 10*3/uL (ref 0.1–0.9)
MONO%: 10.6 % (ref 0.0–14.0)
NEUT#: 3.4 10*3/uL (ref 1.5–6.5)
NEUT%: 51.1 % (ref 39.0–75.0)
PLATELETS: 212 10*3/uL (ref 140–400)
RBC: 3.41 10*6/uL — ABNORMAL LOW (ref 4.20–5.82)
RDW: 18.7 % — ABNORMAL HIGH (ref 11.0–14.6)
WBC: 6.7 10*3/uL (ref 4.0–10.3)

## 2014-08-23 MED ORDER — IBRUTINIB 140 MG PO CAPS: 560.0000 mg | ORAL_CAPSULE | Freq: Every day | ORAL | Status: DC

## 2014-08-23 NOTE — Assessment & Plan Note (Signed)
This is likely anemia of chronic disease, recent GI bleed and chronic kidney disease. The patient denies recent history of bleeding such as epistaxis, hematuria or hematochezia. He is asymptomatic from the anemia. We will observe for now.  He does not require transfusion now.

## 2014-08-23 NOTE — Progress Notes (Signed)
[facility] OFFICE PROGRESS NOTE  Patient Care Team: Dione Housekeeper, MD as PCP - General (Family Medicine) Heath Lark, MD as Consulting Physician (Hematology and Oncology) Irene Shipper, MD as Consulting Physician (Gastroenterology)  SUMMARY OF ONCOLOGIC HISTORY:  I reviewed the patient's records extensive and collaborated the history with the patient. Summary of his history is as follows: He was diagnosed with Mantle cell lymphoma diagnosed in March 2008 with positive bone marrow initially on 12/03/2006 and then negative bone marrow after treatment in October 2009. The patient presented with obstructive liver abnormalities requiring ERCP and non metal stent placement by Dr. Erskine Emery. The stent was subsequently removed in October 2008.  The patient was treated with 8 cycles of Rituxan, Cytoxan, vincristine and Decadron in combination with Neulasta from 12/19/2006 through 05/15/2007. The patient then received maintenance Rituxan from July 16, 2007 through February 01, 2010. While on Rituxan, he had a recurrence of his disease by CT scan carried out on 01/24/2010. Mr. Driggers then received treatment with bendamustine and Rituxan in combination with Neulasta for 6 cycles from 02/19/2010 through 07/26/2010. PET scan from 08/22/2010 and CT scans of chest, abdomen, and pelvis from 01/01/2011 that showed no evidence of disease. He had been off treatment since December 2011 and had been doing well without any symptoms until the CT scan of the abdomen and pelvis on 07/01/2011 showed signs of recurrence. CT scans of abdomen and pelvis with IVC on 09/02/11 showed further progression. Rituxan, subcutaneous Velcade, intravenous Cytoxan and Decadron were initiated on 10/15/2011. CT scans of the abdomen and pelvis carried out on 01/08/2012 showed a near complete response to therapy. CT scan of the abdomen and pelvis with IV contrast carried out on 06/19/2012 showed stable plaque-like soft tissue density  in the upper abdominal retroperitoneum compared with the CT scan of 01/08/2012. CT scan of the abdomen and pelvis with IV contrast carried out on 11/30/2012 showed no evidence of recurrent lymphoma.  As of 08/05/2013, treatment program will be as follows:  -Velcade 2.75 mg subcu.  -Cytoxan 400 mg IV.  -Decadron 20 mg IV.  The above drugs will be given every 3 weeks.  -Rituxan 800 mg IV every 6 weeks.  Previously Velcade, Cytoxan, and Decadron were being administered every 2 weeks, and Rituxan was being administered every 4 weeks. (from 12/04/2012 - 08/05/2013). Prior to that  beginning on 10/15/2011 - 12/04/2012, Velcade, Cytoxan, and Decadron were being administered weekly, and Rituxan was being administered every 2 weeks. In August of 2014, he was scheduled  for chemotherapy but had chest pain and it was held. He underwent a cardiac cath which revealed two of three native vessel ischemic disease on 04/13/2013 but he was a poor surgical candidate for a re-do CABG. His therapy was re-started in October 2014.   Overall he's tolerated his treatment without difficulty.  In April he had an accidental fall resulting in a left hip fracture.  He underwent L THR on 12/09/2013 by Dr. Paralee Cancel.  He was discharged to rehab.  Prior to his fall, he had restaging scans including CT C/A/P on 04/09 which demonstrated absence of recurrent lymphoma in the chest, abdomen or pelvis.  Repeat PET/CT scan on 07/27/2014 show significant disease progression. Rituximab was discontinued  The patient was admitted to the hospital from 08/02/2014 to 08/12/2014 due to significant GI bleed from malignant tumor in his stomach that was cauterized , biopsy proven to be recurrence of lymphoma.  He was in the intensive care  unit due to hemorrhagic shock requiring significant blood transfusion. The patient also developed mild acute encephalopathy with minor neurological deficit which subsequently resolved.  INTERVAL HISTORY: Please see  below for problem oriented charting. He returns today to review test results of his recent biopsy. He has almost fully recovered from recent hospitalization. Denies focal neurological deficit. The patient denies any recent signs or symptoms of bleeding such as spontaneous epistaxis, hematuria or hematochezia. He complained of shortness of breath on minimal exertion and significant bilateral lower extremity edema  REVIEW OF SYSTEMS:   Constitutional: Denies fevers, chills or abnormal weight loss Eyes: Denies blurriness of vision Ears, nose, mouth, throat, and face: Denies mucositis or sore throat Respiratory: Denies cough, dyspnea or wheezes Cardiovascular: Denies palpitation, chest discomfort  Gastrointestinal:  Denies nausea, heartburn or change in bowel habits Skin: Denies abnormal skin rashes Lymphatics: Denies new lymphadenopathy or easy bruising Neurological:Denies numbness, tingling or new weaknesses Behavioral/Psych: Mood is stable, no new changes  All other systems were reviewed with the patient and are negative.  I have reviewed the past medical history, past surgical history, social history and family history with the patient and they are unchanged from previous note.  ALLERGIES:  is allergic to atorvastatin.  MEDICATIONS:  Current Outpatient Prescriptions  Medication Sig Dispense Refill  . acyclovir (ZOVIRAX) 400 MG tablet TAKE ONE TABLET BY MOUTH TWICE DAILY (Patient taking differently: Take 400 mg by mouth daily. For 6 months then stop) 60 tablet 2  . atenolol (TENORMIN) 50 MG tablet Take 1 tablet (50 mg total) by mouth daily. 30 tablet 11  . citalopram (CELEXA) 20 MG tablet TAKE ONE TABLET BY MOUTH ONCE DAILY 30 tablet 2  . isosorbide mononitrate (IMDUR) 120 MG 24 hr tablet Take 1 tablet (120 mg total) by mouth daily. 30 tablet 11  . lactose free nutrition (BOOST PLUS) LIQD Take 237 mLs by mouth 2 (two) times daily as needed (Allow w/pt or pt's wife request after diet  advancement). 60 Can 0  . pantoprazole (PROTONIX) 40 MG tablet Take 1 tablet (40 mg total) by mouth 2 (two) times daily. 60 tablet 2  . pravastatin (PRAVACHOL) 80 MG tablet Take 1 tablet (80 mg total) by mouth daily. 30 tablet 11  . PRESCRIPTION MEDICATION Inject into the vein every 21 ( twenty-one) days. Velcade and Cytoxan.    Marland Kitchen amLODipine (NORVASC) 5 MG tablet Take 1 tablet (5 mg total) by mouth daily. (Patient not taking: Reported on 08/23/2014) 30 tablet 11  . furosemide (LASIX) 20 MG tablet Take one daily as needed for fluid (Patient not taking: Reported on 08/23/2014) 30 tablet 3  . HYDROcodone-acetaminophen (NORCO/VICODIN) 5-325 MG per tablet Take 1-2 tablets by mouth every 6 (six) hours as needed for moderate pain. (Patient not taking: Reported on 08/23/2014) 100 tablet 0  . ibrutinib (IMBRUVICA) 140 MG capsul Take 4 capsules (560 mg total) by mouth daily. 120 capsule 9  . lidocaine-prilocaine (EMLA) cream Apply 1 application topically See admin instructions. Apply to port 1 hour before treatment. Cover site with plastic wrap (Patient not taking: Reported on 08/23/2014) 30 g 2  . methocarbamol (ROBAXIN) 500 MG tablet Take 1 tablet (500 mg total) by mouth every 6 (six) hours as needed for muscle spasms. (Patient not taking: Reported on 08/23/2014) 50 tablet 0  . nitroGLYCERIN (NITROSTAT) 0.4 MG SL tablet Place 1 tablet (0.4 mg total) under the tongue every 5 (five) minutes as needed for chest pain. (Patient not taking: Reported on 08/23/2014) 25 tablet  prn  . sulfamethoxazole-trimethoprim (BACTRIM DS,SEPTRA DS) 800-160 MG per tablet Take 1 tablet by mouth every Monday, Wednesday, and Friday. (Patient not taking: Reported on 08/02/2014) 24 tablet 3   No current facility-administered medications for this visit.    PHYSICAL EXAMINATION: ECOG PERFORMANCE STATUS: 1 - Symptomatic but completely ambulatory  Filed Vitals:   08/23/14 1358  BP: 140/72  Pulse: 68  Temp: 98 F (36.7 C)  Resp: 18    Filed Weights   08/23/14 1358  Weight: 209 lb 8 oz (95.029 kg)    GENERAL:alert, no distress and comfortable SKIN: skin color, texture, turgor are normal, no rashes or significant lesions EYES: normal, Conjunctiva are pink and non-injected, sclera clear OROPHARYNX:no exudate, no erythema and lips, buccal mucosa, and tongue normal  NECK: supple, thyroid normal size, non-tender, without nodularity LYMPH:  no palpable lymphadenopathy in the cervical, axillary or inguinal LUNGS: clear to auscultation and percussion with normal breathing effort HEART: regular rate & rhythm and no murmurs  With moderate bilateral lower extremity edema ABDOMEN:abdomen soft, non-tender and normal bowel sounds Musculoskeletal:no cyanosis of digits and no clubbing  NEURO: alert & oriented x 3 with fluent speech, no focal motor/sensory deficits  LABORATORY DATA:  I have reviewed the data as listed    Component Value Date/Time   NA 144 08/23/2014 1324   NA 141 08/12/2014 0400   NA 142 09/02/2011 0936   K 4.6 08/23/2014 1324   K 3.3* 08/12/2014 0400   K 4.1 09/02/2011 0936   CL 101 08/12/2014 0400   CL 106 01/29/2013 0900   CL 98 09/02/2011 0936   CO2 28 08/23/2014 1324   CO2 29 08/12/2014 0400   CO2 29 09/02/2011 0936   GLUCOSE 92 08/23/2014 1324   GLUCOSE 126* 08/12/2014 0400   GLUCOSE 101* 01/29/2013 0900   GLUCOSE 96 09/02/2011 0936   BUN 11.3 08/23/2014 1324   BUN 14 08/12/2014 0400   BUN 17 09/02/2011 0936   CREATININE 1.3 08/23/2014 1324   CREATININE 1.07 08/12/2014 0400   CREATININE 0.8 09/02/2011 0936   CALCIUM 8.8 08/23/2014 1324   CALCIUM 8.3* 08/12/2014 0400   CALCIUM 8.5 09/02/2011 0936   PROT 5.9* 08/23/2014 1324   PROT 5.1* 08/09/2014 0405   PROT 7.1 09/02/2011 0936   ALBUMIN 3.2* 08/23/2014 1324   ALBUMIN 2.1* 08/09/2014 0405   AST 19 08/23/2014 1324   AST 27 08/09/2014 0405   AST 21 09/02/2011 0936   ALT 12 08/23/2014 1324   ALT 10 08/09/2014 0405   ALT 17 09/02/2011  0936   ALKPHOS 127 08/23/2014 1324   ALKPHOS 73 08/09/2014 0405   ALKPHOS 72 09/02/2011 0936   BILITOT 0.69 08/23/2014 1324   BILITOT 0.2* 08/09/2014 0405   BILITOT 0.60 09/02/2011 0936   GFRNONAA 64* 08/12/2014 0400   GFRAA 74* 08/12/2014 0400    No results found for: SPEP, UPEP  Lab Results  Component Value Date   WBC 6.7 08/23/2014   NEUTROABS 3.4 08/23/2014   HGB 10.1* 08/23/2014   HCT 32.0* 08/23/2014   MCV 93.8 08/23/2014   PLT 212 08/23/2014      Chemistry      Component Value Date/Time   NA 144 08/23/2014 1324   NA 141 08/12/2014 0400   NA 142 09/02/2011 0936   K 4.6 08/23/2014 1324   K 3.3* 08/12/2014 0400   K 4.1 09/02/2011 0936   CL 101 08/12/2014 0400   CL 106 01/29/2013 0900   CL 98 09/02/2011  0936   CO2 28 08/23/2014 1324   CO2 29 08/12/2014 0400   CO2 29 09/02/2011 0936   BUN 11.3 08/23/2014 1324   BUN 14 08/12/2014 0400   BUN 17 09/02/2011 0936   CREATININE 1.3 08/23/2014 1324   CREATININE 1.07 08/12/2014 0400   CREATININE 0.8 09/02/2011 0936      Component Value Date/Time   CALCIUM 8.8 08/23/2014 1324   CALCIUM 8.3* 08/12/2014 0400   CALCIUM 8.5 09/02/2011 0936   ALKPHOS 127 08/23/2014 1324   ALKPHOS 73 08/09/2014 0405   ALKPHOS 72 09/02/2011 0936   AST 19 08/23/2014 1324   AST 27 08/09/2014 0405   AST 21 09/02/2011 0936   ALT 12 08/23/2014 1324   ALT 10 08/09/2014 0405   ALT 17 09/02/2011 0936   BILITOT 0.69 08/23/2014 1324   BILITOT 0.2* 08/09/2014 0405   BILITOT 0.60 09/02/2011 0936      ASSESSMENT & PLAN:  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   Role of treatment is palliative. The decision was made based on publication at the [facility]. It is a category 1 recommendation from NCCN. Targeting BTK with Ibrutinib in Relapsed or Refractory Mantle-Cell Lymphoma Michael L. Mina Marble, M.D., Remi Haggard, M.D.,  Lita Mains, M.D., Lanice Shirts, M.D., Kerrin Champagne, M.D., Ph.D., Gay Filler, M.D., Knute Neu, M.D., Ph.D., Tonye Pearson. Annitta Needs, M.D., Leavy Cella. Oswald Hillock, M.D., Heinz Knuckles. Jimmye Norman, M.D., Link Snuffer, M.D., Leanor Kail, M.D., Olam Idler, M.D., Lequita Asal, M.D., Norberta Keens, M.D., [facility] Robie Ridge, M.D., Marisa Sprinkles, M.D., Harriette Ohara. Retta Mac, M.D., Hope Pigeon, Ph.D., Julieanne Manson, M.D., Ph.D., Felipa Furnace, Ph.D., Nanci Pina, M.D., Toma Copier, M.S., Lauretta Chester, Ph.D., Earnestine Mealing, M.D., Oretha Caprice, [place].D., Azzie Almas, Ph.D., Garnetta Buddy. Radene Knee, Ph.D., Darrin M. Ocie Cornfield, M.D., Ph.D., Elige Radon, M.D., and Tresa Garter. Hetty Ely, M.D. Alison Stalling J Med 2013; [id number] 8, 2013DOI: 10.1056/[id number] We discussed the role of chemotherapy. The intent is for palliative.  The chemotherapy consists of Ibrutinib, a Bruton's tyrosine kinase inhibitor.  Ibrutinib is taken as a single agent oral treatment indefinitely until unexpected side-effects or lack of response.  The chemotherapy yield an average response rate of 68% with complete response rates of 21%. Estimated progression free survival was 13.9 months and median overall survival has not been reached.  Some of the short term side-effects included, though not limited to, risk of fatigue, weight loss, tumor lysis syndrome, risk of allergic reactions, pancytopenia, bruising, diarrhea, transient leukocytosis, life-threatening infections, need for transfusions of blood products, nausea, vomiting, change in bowel habits, admission to hospital for various reasons, and risks of death.   The patient is aware that the response rates discussed earlier is not guaranteed.    After a long discussion, patient made an informed decision to proceed with the prescribed plan of care.     Anemia in neoplastic disease This is likely anemia of chronic disease, recent GI bleed and chronic kidney disease. The patient  denies recent history of bleeding such as epistaxis, hematuria or hematochezia. He is asymptomatic from the anemia. We will observe for now.  He does not require transfusion now.   Chronic kidney disease (CKD), stage II (mild) This is chronic in nature, unlikely related to his cancer. Recommend close observation.  Edema  He has increasing shortness of breath and bilateral leg edema since discontinuation of diuretic therapy. I recommend  he resumed his diuretics.   Orders Placed This Encounter  Procedures  . CBC with Differential    Standing Status: Standing     Number of Occurrences: 9     Standing Expiration Date: 08/24/2015  . Comprehensive metabolic panel    Standing Status: Standing     Number of Occurrences: 9     Standing Expiration Date: 08/24/2015   All questions were answered. The patient knows to call the clinic with any problems, questions or concerns. No barriers to learning was detected. I spent 40 minutes counseling the patient face to face. The total time spent in the appointment was 60 minutes and more than 50% was on counseling and review of test results     [facility], [place], MD 08/23/2014 9:30 PM

## 2014-08-23 NOTE — Assessment & Plan Note (Signed)
I reviewed the report of the biopsy from the stomach which confirmed the diagnosis of refractory mantle cell lymphoma.   Role of treatment is palliative. The decision was made based on publication at the [facility]. It is a category 1 recommendation from NCCN. Targeting BTK with Ibrutinib in Relapsed or Refractory Mantle-Cell Lymphoma Michael L. Mina Marble, M.D., Remi Haggard, M.D., Lita Mains, M.D., Lanice Shirts, M.D., Kerrin Champagne, M.D., Ph.D., Gay Filler, M.D., Knute Neu, M.D., Ph.D., Tonye Pearson. Annitta Needs, M.D., Leavy Cella. Oswald Hillock, M.D., Heinz Knuckles. Jimmye Norman, M.D., Link Snuffer, M.D., Leanor Kail, M.D., Olam Idler, M.D., Lequita Asal, M.D., Norberta Keens, M.D., [facility] Robie Ridge, M.D., Marisa Sprinkles, M.D., Harriette Ohara. Retta Mac, M.D., Hope Pigeon, Ph.D., Julieanne Manson, M.D., Ph.D., Felipa Furnace, Ph.D., Nanci Pina, M.D., Toma Copier, M.S., Lauretta Chester, Ph.D., Earnestine Mealing, M.D., Oretha Caprice, [place].D., Azzie Almas, Ph.D., Garnetta Buddy. Radene Knee, Ph.D., Darrin M. Ocie Cornfield, M.D., Ph.D., Elige Radon, M.D., and Tresa Garter. Hetty Ely, M.D. Alison Stalling J Med 2013; [id number] 8, 2013DOI: 10.1056/[id number] We discussed the role of chemotherapy. The intent is for palliative.  The chemotherapy consists of Ibrutinib, a Bruton's tyrosine kinase inhibitor.  Ibrutinib is taken as a single agent oral treatment indefinitely until unexpected side-effects or lack of response.  The chemotherapy yield an average response rate of 68% with complete response rates of 21%. Estimated progression free survival was 13.9 months and median overall survival has not been reached.  Some of the short term side-effects included, though not limited to, risk of fatigue, weight loss, tumor lysis syndrome, risk of allergic reactions, pancytopenia, bruising, diarrhea, transient leukocytosis, life-threatening infections, need for transfusions of blood products, nausea, vomiting, change in bowel habits, admission to  hospital for various reasons, and risks of death.   The patient is aware that the response rates discussed earlier is not guaranteed.    After a long discussion, patient made an informed decision to proceed with the prescribed plan of care.

## 2014-08-23 NOTE — Assessment & Plan Note (Signed)
He has increasing shortness of breath and bilateral leg edema since discontinuation of diuretic therapy. I recommend he resumed his diuretics.

## 2014-08-23 NOTE — Assessment & Plan Note (Signed)
This is chronic in nature, unlikely related to his cancer. Recommend close observation.

## 2014-08-23 NOTE — Telephone Encounter (Signed)
Pt confirmed labs/ov per 12/29 POF, gave pt AVS.... KJ °

## 2014-08-24 ENCOUNTER — Encounter: Payer: Self-pay | Admitting: Hematology and Oncology

## 2014-08-24 ENCOUNTER — Telehealth: Payer: Self-pay | Admitting: *Deleted

## 2014-08-24 NOTE — Telephone Encounter (Signed)
Faxed rx for imbruvica to [place]

## 2014-08-24 NOTE — Progress Notes (Signed)
[facility], [id number], approved imbruvica 140mg  from 08/24/14-08/25/15 [id number]

## 2014-08-28 ENCOUNTER — Inpatient Hospital Stay (HOSPITAL_COMMUNITY)
Admission: EM | Admit: 2014-08-28 | Discharge: 2014-09-02 | DRG: 280 | Disposition: A | Discharge status: Home / Self Care | Payer: Medicare Other | Attending: Family Medicine | Admitting: Family Medicine

## 2014-08-28 ENCOUNTER — Encounter (HOSPITAL_COMMUNITY): Payer: Self-pay | Admitting: *Deleted

## 2014-08-28 ENCOUNTER — Emergency Department (HOSPITAL_COMMUNITY): Payer: Medicare Other

## 2014-08-28 DIAGNOSIS — N390 Urinary tract infection, site not specified: Secondary | ICD-10-CM | POA: Diagnosis present

## 2014-08-28 DIAGNOSIS — I472 Ventricular tachycardia: Secondary | ICD-10-CM | POA: Diagnosis present

## 2014-08-28 DIAGNOSIS — E46 Unspecified protein-calorie malnutrition: Secondary | ICD-10-CM | POA: Diagnosis present

## 2014-08-28 DIAGNOSIS — I129 Hypertensive chronic kidney disease with stage 1 through stage 4 chronic kidney disease, or unspecified chronic kidney disease: Secondary | ICD-10-CM | POA: Diagnosis present

## 2014-08-28 DIAGNOSIS — E876 Hypokalemia: Secondary | ICD-10-CM | POA: Diagnosis present

## 2014-08-28 DIAGNOSIS — N184 Chronic kidney disease, stage 4 (severe): Secondary | ICD-10-CM | POA: Diagnosis present

## 2014-08-28 DIAGNOSIS — I071 Rheumatic tricuspid insufficiency: Secondary | ICD-10-CM | POA: Diagnosis present

## 2014-08-28 DIAGNOSIS — Z6830 Body mass index (BMI) 30.0-30.9, adult: Secondary | ICD-10-CM

## 2014-08-28 DIAGNOSIS — C8318 Mantle cell lymphoma, lymph nodes of multiple sites: Secondary | ICD-10-CM | POA: Diagnosis present

## 2014-08-28 DIAGNOSIS — K259 Gastric ulcer, unspecified as acute or chronic, without hemorrhage or perforation: Secondary | ICD-10-CM | POA: Diagnosis present

## 2014-08-28 DIAGNOSIS — I2510 Atherosclerotic heart disease of native coronary artery without angina pectoris: Secondary | ICD-10-CM | POA: Diagnosis present

## 2014-08-28 DIAGNOSIS — G4730 Sleep apnea, unspecified: Secondary | ICD-10-CM | POA: Diagnosis present

## 2014-08-28 DIAGNOSIS — Z951 Presence of aortocoronary bypass graft: Secondary | ICD-10-CM | POA: Diagnosis not present

## 2014-08-28 DIAGNOSIS — I272 Other secondary pulmonary hypertension: Secondary | ICD-10-CM | POA: Diagnosis present

## 2014-08-28 DIAGNOSIS — B9620 Unspecified Escherichia coli [E. coli] as the cause of diseases classified elsewhere: Secondary | ICD-10-CM | POA: Diagnosis present

## 2014-08-28 DIAGNOSIS — I214 Non-ST elevation (NSTEMI) myocardial infarction: Secondary | ICD-10-CM | POA: Diagnosis present

## 2014-08-28 DIAGNOSIS — R0902 Hypoxemia: Secondary | ICD-10-CM | POA: Diagnosis present

## 2014-08-28 DIAGNOSIS — E669 Obesity, unspecified: Secondary | ICD-10-CM | POA: Diagnosis present

## 2014-08-28 DIAGNOSIS — Z8673 Personal history of transient ischemic attack (TIA), and cerebral infarction without residual deficits: Secondary | ICD-10-CM | POA: Diagnosis not present

## 2014-08-28 DIAGNOSIS — I5033 Acute on chronic diastolic (congestive) heart failure: Secondary | ICD-10-CM | POA: Diagnosis present

## 2014-08-28 DIAGNOSIS — M1990 Unspecified osteoarthritis, unspecified site: Secondary | ICD-10-CM | POA: Diagnosis present

## 2014-08-28 DIAGNOSIS — I10 Essential (primary) hypertension: Secondary | ICD-10-CM | POA: Diagnosis present

## 2014-08-28 DIAGNOSIS — I739 Peripheral vascular disease, unspecified: Secondary | ICD-10-CM | POA: Diagnosis present

## 2014-08-28 DIAGNOSIS — Z9841 Cataract extraction status, right eye: Secondary | ICD-10-CM

## 2014-08-28 DIAGNOSIS — Z96642 Presence of left artificial hip joint: Secondary | ICD-10-CM | POA: Diagnosis present

## 2014-08-28 DIAGNOSIS — I951 Orthostatic hypotension: Secondary | ICD-10-CM | POA: Diagnosis not present

## 2014-08-28 DIAGNOSIS — D630 Anemia in neoplastic disease: Secondary | ICD-10-CM | POA: Diagnosis present

## 2014-08-28 DIAGNOSIS — E785 Hyperlipidemia, unspecified: Secondary | ICD-10-CM | POA: Diagnosis present

## 2014-08-28 DIAGNOSIS — R0602 Shortness of breath: Secondary | ICD-10-CM

## 2014-08-28 DIAGNOSIS — N182 Chronic kidney disease, stage 2 (mild): Secondary | ICD-10-CM

## 2014-08-28 DIAGNOSIS — D6959 Other secondary thrombocytopenia: Secondary | ICD-10-CM | POA: Diagnosis present

## 2014-08-28 DIAGNOSIS — Z85238 Personal history of other malignant neoplasm of thymus: Secondary | ICD-10-CM

## 2014-08-28 DIAGNOSIS — R509 Fever, unspecified: Secondary | ICD-10-CM

## 2014-08-28 DIAGNOSIS — N12 Tubulo-interstitial nephritis, not specified as acute or chronic: Secondary | ICD-10-CM | POA: Diagnosis present

## 2014-08-28 DIAGNOSIS — Z888 Allergy status to other drugs, medicaments and biological substances status: Secondary | ICD-10-CM | POA: Diagnosis not present

## 2014-08-28 DIAGNOSIS — C169 Malignant neoplasm of stomach, unspecified: Secondary | ICD-10-CM

## 2014-08-28 DIAGNOSIS — D696 Thrombocytopenia, unspecified: Secondary | ICD-10-CM

## 2014-08-28 DIAGNOSIS — C8310 Mantle cell lymphoma, unspecified site: Secondary | ICD-10-CM | POA: Diagnosis present

## 2014-08-28 DIAGNOSIS — R0600 Dyspnea, unspecified: Secondary | ICD-10-CM

## 2014-08-28 DIAGNOSIS — N400 Enlarged prostate without lower urinary tract symptoms: Secondary | ICD-10-CM | POA: Diagnosis present

## 2014-08-28 DIAGNOSIS — D72829 Elevated white blood cell count, unspecified: Secondary | ICD-10-CM

## 2014-08-28 LAB — URINALYSIS, ROUTINE W REFLEX MICROSCOPIC
Bilirubin Urine: NEGATIVE
Glucose, UA: NEGATIVE mg/dL
Ketones, ur: NEGATIVE mg/dL
Nitrite: POSITIVE — AB
Protein, ur: 100 mg/dL — AB
Specific Gravity, Urine: 1.011 (ref 1.005–1.030)
Urobilinogen, UA: 0.2 mg/dL (ref 0.0–1.0)
pH: 6 (ref 5.0–8.0)

## 2014-08-28 LAB — CBC WITH DIFFERENTIAL/PLATELET
Basophils Absolute: 0 10*3/uL (ref 0.0–0.1)
Basophils Relative: 0 % (ref 0–1)
Eosinophils Absolute: 0 10*3/uL (ref 0.0–0.7)
Eosinophils Relative: 0 % (ref 0–5)
HCT: 32.9 % — ABNORMAL LOW (ref 39.0–52.0)
Hemoglobin: 10.1 g/dL — ABNORMAL LOW (ref 13.0–17.0)
Lymphocytes Relative: 4 % — ABNORMAL LOW (ref 12–46)
Lymphs Abs: 0.5 10*3/uL — ABNORMAL LOW (ref 0.7–4.0)
MCH: 29.4 pg (ref 26.0–34.0)
MCHC: 30.7 g/dL (ref 30.0–36.0)
MCV: 95.9 fL (ref 78.0–100.0)
Monocytes Absolute: 0.5 10*3/uL (ref 0.1–1.0)
Monocytes Relative: 5 % (ref 3–12)
Neutro Abs: 10.1 10*3/uL — ABNORMAL HIGH (ref 1.7–7.7)
Neutrophils Relative %: 91 % — ABNORMAL HIGH (ref 43–77)
Platelets: 142 10*3/uL — ABNORMAL LOW (ref 150–400)
RBC: 3.43 MIL/uL — ABNORMAL LOW (ref 4.22–5.81)
RDW: 17.3 % — ABNORMAL HIGH (ref 11.5–15.5)
WBC: 11.1 10*3/uL — ABNORMAL HIGH (ref 4.0–10.5)

## 2014-08-28 LAB — MAGNESIUM: Magnesium: 1.6 mg/dL (ref 1.5–2.5)

## 2014-08-28 LAB — URINE MICROSCOPIC-ADD ON

## 2014-08-28 LAB — PROTIME-INR
INR: 1.19 (ref 0.00–1.49)
PROTHROMBIN TIME: 15.2 s (ref 11.6–15.2)

## 2014-08-28 LAB — COMPREHENSIVE METABOLIC PANEL
ALT: 9 U/L (ref 0–53)
AST: 22 U/L (ref 0–37)
Albumin: 3.4 g/dL — ABNORMAL LOW (ref 3.5–5.2)
Alkaline Phosphatase: 108 U/L (ref 39–117)
Anion gap: 6 (ref 5–15)
BUN: 17 mg/dL (ref 6–23)
CO2: 27 mmol/L (ref 19–32)
Calcium: 8.2 mg/dL — ABNORMAL LOW (ref 8.4–10.5)
Chloride: 105 mEq/L (ref 96–112)
Creatinine, Ser: 1.4 mg/dL — ABNORMAL HIGH (ref 0.50–1.35)
GFR calc Af Amer: 54 mL/min — ABNORMAL LOW (ref >90)
GFR calc non Af Amer: 46 mL/min — ABNORMAL LOW (ref >90)
Glucose, Bld: 123 mg/dL — ABNORMAL HIGH (ref 70–99)
Potassium: 2.8 mmol/L — ABNORMAL LOW (ref 3.5–5.1)
Sodium: 138 mmol/L (ref 135–145)
Total Bilirubin: 1.1 mg/dL (ref 0.3–1.2)
Total Protein: 5.8 g/dL — ABNORMAL LOW (ref 6.0–8.3)

## 2014-08-28 LAB — TSH: TSH: 3.924 u[IU]/mL (ref 0.350–4.500)

## 2014-08-28 LAB — TROPONIN I: Troponin I: 2.04 ng/mL (ref <0.031)

## 2014-08-28 LAB — I-STAT CG4 LACTIC ACID, ED: Lactic Acid, Venous: 1.51 mmol/L (ref 0.5–2.2)

## 2014-08-28 LAB — BRAIN NATRIURETIC PEPTIDE: B Natriuretic Peptide: 702.8 pg/mL — ABNORMAL HIGH (ref 0.0–100.0)

## 2014-08-28 LAB — MRSA PCR SCREENING: MRSA BY PCR: NEGATIVE

## 2014-08-28 MED ORDER — ACYCLOVIR 400 MG PO TABS
400.0000 mg | ORAL_TABLET | Freq: Every day | ORAL | Status: DC
  Administered 2014-08-28 – 2014-09-02 (×6): 400 mg via ORAL
  Filled 2014-08-28 (×6): qty 1

## 2014-08-28 MED ORDER — FUROSEMIDE 10 MG/ML IJ SOLN
40.0000 mg | Freq: Two times a day (BID) | INTRAMUSCULAR | Status: DC
  Administered 2014-08-28 – 2014-08-30 (×4): 40 mg via INTRAVENOUS
  Filled 2014-08-28 (×4): qty 4

## 2014-08-28 MED ORDER — DEXTROSE 5 % IV SOLN
1.0000 g | Freq: Once | INTRAVENOUS | Status: AC
  Administered 2014-08-28: 1 g via INTRAVENOUS
  Filled 2014-08-28: qty 10

## 2014-08-28 MED ORDER — PRAVASTATIN SODIUM 80 MG PO TABS
80.0000 mg | ORAL_TABLET | Freq: Every day | ORAL | Status: DC
  Administered 2014-08-28 – 2014-08-30 (×3): 80 mg via ORAL
  Filled 2014-08-28 (×5): qty 1

## 2014-08-28 MED ORDER — ACETAMINOPHEN 325 MG PO TABS: 650.0000 mg | ORAL_TABLET | ORAL | Status: DC | PRN

## 2014-08-28 MED ORDER — SODIUM CHLORIDE 0.9 % IJ SOLN
INTRAMUSCULAR | Status: AC
  Filled 2014-08-28: qty 10

## 2014-08-28 MED ORDER — ACETAMINOPHEN 325 MG PO TABS
650.0000 mg | ORAL_TABLET | ORAL | Status: DC | PRN
  Administered 2014-08-29 – 2014-08-31 (×2): 650 mg via ORAL
  Filled 2014-08-28 (×2): qty 2

## 2014-08-28 MED ORDER — SODIUM CHLORIDE 0.9 % IJ SOLN: 3.0000 mL | INTRAMUSCULAR | Status: DC | PRN

## 2014-08-28 MED ORDER — ONDANSETRON HCL 4 MG/2ML IJ SOLN: 4.0000 mg | Freq: Four times a day (QID) | INTRAMUSCULAR | Status: DC | PRN

## 2014-08-28 MED ORDER — LIDOCAINE-PRILOCAINE 2.5-2.5 % EX CREA: 1.0000 "application " | TOPICAL_CREAM | CUTANEOUS | Status: DC

## 2014-08-28 MED ORDER — PANTOPRAZOLE SODIUM 40 MG PO TBEC
40.0000 mg | DELAYED_RELEASE_TABLET | Freq: Two times a day (BID) | ORAL | Status: DC
  Administered 2014-08-28 – 2014-09-02 (×10): 40 mg via ORAL
  Filled 2014-08-28 (×11): qty 1

## 2014-08-28 MED ORDER — SODIUM CHLORIDE 0.9 % IV SOLN
250.0000 mL | INTRAVENOUS | Status: DC | PRN
  Administered 2014-08-29: 250 mL via INTRAVENOUS

## 2014-08-28 MED ORDER — ATENOLOL 50 MG PO TABS
50.0000 mg | ORAL_TABLET | Freq: Every day | ORAL | Status: DC
  Administered 2014-08-29 – 2014-08-30 (×2): 50 mg via ORAL
  Filled 2014-08-28 (×2): qty 2
  Filled 2014-08-28: qty 1

## 2014-08-28 MED ORDER — POTASSIUM CHLORIDE 10 MEQ/100ML IV SOLN
10.0000 meq | Freq: Once | INTRAVENOUS | Status: AC
  Administered 2014-08-28: 10 meq via INTRAVENOUS
  Filled 2014-08-28: qty 100

## 2014-08-28 MED ORDER — POTASSIUM CHLORIDE CRYS ER 20 MEQ PO TBCR
60.0000 meq | EXTENDED_RELEASE_TABLET | Freq: Once | ORAL | Status: AC
  Administered 2014-08-28: 60 meq via ORAL
  Filled 2014-08-28: qty 3

## 2014-08-28 MED ORDER — BOOST PLUS PO LIQD
237.0000 mL | Freq: Two times a day (BID) | ORAL | Status: DC | PRN
  Filled 2014-08-28: qty 237

## 2014-08-28 MED ORDER — ISOSORBIDE MONONITRATE ER 60 MG PO TB24: 120.0000 mg | ORAL_TABLET | Freq: Every day | ORAL | Status: DC

## 2014-08-28 MED ORDER — LIVING BETTER WITH HEART FAILURE BOOK
Freq: Once | Status: AC
  Administered 2014-08-30: 11:00:00

## 2014-08-28 MED ORDER — HYDROCODONE-ACETAMINOPHEN 5-325 MG PO TABS
1.0000 | ORAL_TABLET | Freq: Four times a day (QID) | ORAL | Status: DC | PRN
  Administered 2014-08-28 – 2014-08-30 (×2): 2 via ORAL
  Filled 2014-08-28 (×2): qty 2

## 2014-08-28 MED ORDER — POTASSIUM CHLORIDE CRYS ER 20 MEQ PO TBCR
40.0000 meq | EXTENDED_RELEASE_TABLET | ORAL | Status: AC
  Administered 2014-08-28 (×2): 40 meq via ORAL
  Filled 2014-08-28 (×2): qty 2

## 2014-08-28 MED ORDER — ISOSORBIDE MONONITRATE ER 60 MG PO TB24
120.0000 mg | ORAL_TABLET | Freq: Every day | ORAL | Status: DC
  Administered 2014-08-29 – 2014-09-01 (×4): 120 mg via ORAL
  Filled 2014-08-28 (×4): qty 2

## 2014-08-28 MED ORDER — NITROGLYCERIN 0.4 MG SL SUBL: 0.4000 mg | SUBLINGUAL_TABLET | SUBLINGUAL | Status: DC | PRN

## 2014-08-28 MED ORDER — SODIUM CHLORIDE 0.9 % IJ SOLN
3.0000 mL | Freq: Two times a day (BID) | INTRAMUSCULAR | Status: DC
  Administered 2014-08-28 – 2014-08-29 (×3): 3 mL via INTRAVENOUS

## 2014-08-28 MED ORDER — CITALOPRAM HYDROBROMIDE 20 MG PO TABS
20.0000 mg | ORAL_TABLET | Freq: Every day | ORAL | Status: DC
  Administered 2014-08-28 – 2014-09-02 (×6): 20 mg via ORAL
  Filled 2014-08-28 (×6): qty 1

## 2014-08-28 MED ORDER — CEFTRIAXONE SODIUM 1 G IJ SOLR
1.0000 g | INTRAMUSCULAR | Status: DC
  Administered 2014-08-28 – 2014-08-30 (×3): 1 g via INTRAVENOUS
  Filled 2014-08-28 (×4): qty 10

## 2014-08-28 NOTE — ED Notes (Signed)
MD at bedside. 

## 2014-08-28 NOTE — ED Notes (Signed)
Bed: ZT86 Expected date:  Expected time:  Means of arrival:  Comments: [facility]

## 2014-08-28 NOTE — H&P (Signed)
[facility] History and Physical  Martin Morris UEA:[id number] DOB: 12/22/1932 DOA: 08/28/2014  Referring physician: Dr Wilson Singer PCP: [name], MD   Chief Complaint: SOB/FEVER  HPI: Martin Morris is a 81 y.o. male  With history of coronary artery disease status post CABG, status post cardiac catheterization 04/13/2013 that revealed total three-vessel ischemic disease however patient was a poor surgical candidate for redo CABG with recent 2-D echo from 08/08/2014 with a EF of 55% with grade 1 diastolic dysfunction, hyperlipidemia, hypertension, peripheral vascular disease, bilateral carotid stenosis, non-Hodgkin's lymphoma, mantle cell lymphoma diagnosed in March 2008 with recent PET/CT scan on 07/27/2014 showing significant disease progression. Patient was hospitalized 08/02/2014 2 08/12/2014 secondary to significant GI bleed for malignant tumor in his stomach that was cauterized, biopsy-proven to be recurrence of lymphoma. Patient was in the ICU secondary to hemorrhagic shock requiring significant blood transfusions. Patient also was no acute respiratory failure with hypoxia secondary to pulmonary edema, had a Escherichia coli UTI status post 5 day course of antibiotics, acute CVA per MRI of 08/08/2014 with findings of punctuate, acute posterior circulation infarct involving the left cerebellum and right occipital lobe however due to patient's hemorrhagic shock aspirin and Plavix were discontinued. Patient presents to the ED on the day of admission with a several hour history of worsening shortness of breath with hypoxia nausea vomiting and fever. Per wife she was awoken up by the patient around 3 AM with nausea and nonbloody emesis with a temperature 101.5. She check patient's oxygenation and was noted to have 82% sats on room air was complaining of shortness of breath. Patient's wife thus called 47 and patient was brought to the emergency room. Patient denies any chest pain,  no abdominal pain, no cough, no dysuria, no diarrhea, no constipation, no hematemesis, no melena, no hematochezia, no dizziness, no syncope. Patient does endorse generalized weakness. Patient was seen in the emergency room compressive metabolic profile done on a potassium of 2.8 creatinine of 1.40 hour urine of 3.4 otherwise was within normal limits. Lactic acid level is 1.51. CBC had a white count of 11.1 hemoglobin of 10.1 and a platelet count of 142. Urinalysis done was nitrite positive large leukocytes too numerous to count WBCs, turbid. Chest x-ray done showed cardiomegaly with vascular congestion and small bilateral pleural effusions. And slight ST depression in leads V4 through V6. Patient was given a dose of IV Rocephin. [facility] was called to admit the patient for further evaluation and management.   Review of Systems: As per HPI o/w negative. Constitutional:  No weight loss, night sweats, Fevers, chills, fatigue.  HEENT:  No headaches, Difficulty swallowing,Tooth/dental problems,Sore throat,  No sneezing, itching, ear ache, nasal congestion, post nasal drip,  Cardio-vascular:  No chest pain, Orthopnea, PND, swelling in lower extremities, anasarca, dizziness, palpitations  GI:  No heartburn, indigestion, abdominal pain, nausea, vomiting, diarrhea, change in bowel habits, loss of appetite  Resp:  No shortness of breath with exertion or at rest. No excess mucus, no productive cough, No non-productive cough, No coughing up of blood.No change in color of mucus.No wheezing.No chest wall deformity  Skin:  no rash or lesions.  GU:  no dysuria, change in color of urine, no urgency or frequency. No flank pain.  Musculoskeletal:  No joint pain or swelling. No decreased range of motion. No back pain.  Psych:  No change in mood or affect. No depression or anxiety. No memory loss.   Past Medical History  Diagnosis Date  .  Hypertension   . CAD 11/12/2007    a. s/p CABG;  b. Cath  7/11 Patent LIMA to the LAD. Previously known occlusion of a saphenous vein graft to diagonal and sequential to obtuse marginals. Severe diffuse native obtuse marginal and diagonal branch vessel disease. Preserved ejection fraction.;  c. 03/2013 Lexi CL: mild inflat and inf ischemia, EF 54%->initial med rx.  Marland Kitchen HYPERLIPIDEMIA 11/12/2007  . HYPERTENSION 11/12/2007  . PERIPHERAL VASCULAR DISEASE 11/12/2007  . ARTHRITIS 11/12/2007  . BENIGN PROSTATIC HYPERTROPHY, HX OF 11/12/2007  . Edema 10/17/2010  . INGUINAL HERNIAS, BILATERAL 11/10/2006  . Obesity, unspecified 07/05/2009  . SLEEP APNEA 11/12/2007  . History of PTCA 2005  . Osteoarthritis   . Carotid stenosis, bilateral   . Nephrolithiasis   . thymus ca dx'd 2003    Radiation comp 2003  . NHL (non-Hodgkin's lymphoma) dx'd 2008    Chemo comp 10/2010; rituxin comp 10/2010  . 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 MANTLE CELL LYMPHOMA INTRA-ABDOMINAL LYMPH NODES 11/12/2007  . Tricuspid valve regurgitation    Past Surgical History  Procedure Laterality Date  . Coronary artery bypass graft    . Cardiac catheterization  08/2009, 02/2010  . Total hip arthroplasty Left 12/09/2013    Procedure: TOTAL HIP ARTHROPLASTY;  Surgeon: Mauri Pole, MD;  Location: [facility];  Service: Orthopedics;  Laterality: Left;  . Esophagogastroduodenoscopy N/A 08/02/2014    Procedure: ESOPHAGOGASTRODUODENOSCOPY (EGD) at bedside;  Surgeon: Irene Shipper, MD;  Location: [facility];  Service: Endoscopy;  Laterality: N/A;  . Esophagogastroduodenoscopy (egd) with propofol N/A 08/10/2014    Procedure: ESOPHAGOGASTRODUODENOSCOPY (EGD) WITH PROPOFOL;  Surgeon: Milus Banister, MD;  Location: [facility];  Service: Endoscopy;  Laterality: N/A;   Social History:  reports that he has never smoked. He has never used smokeless tobacco. He reports that he does not drink alcohol or use illicit drugs.  Allergies  Allergen Reactions  . Atorvastatin Other (See Comments)    Incredible dizziness     Family History  Problem Relation Age of Onset  . Coronary artery disease Mother     died at 33  . Heart attack Sister     died at age 14  . Heart attack Brother      Prior to Admission medications   Medication Sig Start Date End Date Taking? Authorizing Provider  acyclovir (ZOVIRAX) 400 MG tablet TAKE ONE TABLET BY MOUTH TWICE DAILY Patient taking differently: Take 400 mg by mouth daily. For 6 months then stop 06/20/14  Yes Aasim Marla Roe, MD  atenolol (TENORMIN) 50 MG tablet Take 1 tablet (50 mg total) by mouth daily. 01/12/14  Yes Minus Breeding, MD  citalopram (CELEXA) 20 MG tablet TAKE ONE TABLET BY MOUTH ONCE DAILY 08/12/14  Yes Venetia Maxon Rama, MD  HYDROcodone-acetaminophen (NORCO/VICODIN) 5-325 MG per tablet Take 1-2 tablets by mouth every 6 (six) hours as needed for moderate pain. 12/10/13  Yes Lucille Passy Babish, PA-C  ibrutinib (IMBRUVICA) 140 MG capsul Take 4 capsules (560 mg total) by mouth daily. 08/23/14  Yes Heath Lark, MD  isosorbide mononitrate (IMDUR) 120 MG 24 hr tablet Take 1 tablet (120 mg total) by mouth daily. 01/12/14  Yes Minus Breeding, MD  lactose free nutrition (BOOST PLUS) LIQD Take 237 mLs by mouth 2 (two) times daily as needed (Allow w/pt or pt's wife request after diet advancement). 12/13/13  Yes Annita Brod, MD  lidocaine-prilocaine (EMLA) cream Apply 1 application topically See admin instructions. Apply to port 1 hour before  treatment. Cover site with plastic wrap 03/24/14  Yes Concha Norway, MD  nitroGLYCERIN (NITROSTAT) 0.4 MG SL tablet Place 1 tablet (0.4 mg total) under the tongue every 5 (five) minutes as needed for chest pain. 01/12/14  Yes Minus Breeding, MD  pantoprazole (PROTONIX) 40 MG tablet Take 1 tablet (40 mg total) by mouth 2 (two) times daily. 08/12/14  Yes Venetia Maxon Rama, MD  pravastatin (PRAVACHOL) 80 MG tablet Take 1 tablet (80 mg total) by mouth daily. 01/12/14  Yes Minus Breeding, MD  amLODipine (NORVASC) 5 MG tablet Take 1  tablet (5 mg total) by mouth daily. Patient not taking: Reported on 08/23/2014 01/12/14   Minus Breeding, MD  furosemide (LASIX) 20 MG tablet Take one daily as needed for fluid Patient not taking: Reported on 08/23/2014 04/15/14   Minus Breeding, MD  methocarbamol (ROBAXIN) 500 MG tablet Take 1 tablet (500 mg total) by mouth every 6 (six) hours as needed for muscle spasms. Patient not taking: Reported on 08/23/2014 12/10/13   Lucille Passy Babish, PA-C  sulfamethoxazole-trimethoprim (BACTRIM DS,SEPTRA DS) 800-160 MG per tablet Take 1 tablet by mouth every Monday, Wednesday, and Friday. Patient not taking: Reported on 08/02/2014 07/20/14   Aasim Marla Roe, MD   Physical Exam: Filed Vitals:   08/28/14 1533 08/28/14 1600 08/28/14 1630 08/28/14 1652  BP:  104/54 103/54 112/60  Pulse:  67 66 64  Temp: 98.5 F (36.9 C)     TempSrc: Oral     Resp:  19 15 15   SpO2:  95% 96% 95%    Wt Readings from Last 3 Encounters:  08/23/14 95.029 kg (209 lb 8 oz)  08/10/14 93.895 kg (207 lb)  08/01/14 94.303 kg (207 lb 14.4 oz)    General:  Well-developed lying on gurney sleeping in no acute cardiopulmonary distress. Speaking in full sentences.  Eyes: PERRLA, EOMI, normal lids, irises & conjunctiva ENT: grossly normal hearing, lips & tongue Neck: no LAD, masses or thyromegaly Cardiovascular: RRR, no m/r/g. 2-3+ bilateral lower extremity edema. Telemetry: SR, no arrhythmias  Respiratory: Diffuse crackles. Decreased breath sounds in the bases. No rhonchi.  Abdomen: soft, ntnd, positive bowel sounds, no rebound, no guarding Skin: no rash or induration seen on limited exam Musculoskeletal: grossly normal tone BUE/BLE Psychiatric: grossly normal mood and affect, speech fluent and appropriate Neurologic: Sleeping but easily arousable. Cranial nerves II through XII are grossly intact. Sensation is intact. No focal deficits.           Labs on Admission:  Basic Metabolic Panel:  Recent Labs Lab  08/23/14 1324 08/28/14 1128  NA 144 138  K 4.6 2.8*  CL  --  105  CO2 28 27  GLUCOSE 92 123*  BUN 11.3 17  CREATININE 1.3 1.40*  CALCIUM 8.8 8.2*   Liver Function Tests:  Recent Labs Lab 08/23/14 1324 08/28/14 1128  AST 19 22  ALT 12 9  ALKPHOS 127 108  BILITOT 0.69 1.1  PROT 5.9* 5.8*  ALBUMIN 3.2* 3.4*   No results for input(s): LIPASE, AMYLASE in the last 168 hours. No results for input(s): AMMONIA in the last 168 hours. CBC:  Recent Labs Lab 08/23/14 1324 08/28/14 1128  WBC 6.7 11.1*  NEUTROABS 3.4 10.1*  HGB 10.1* 10.1*  HCT 32.0* 32.9*  MCV 93.8 95.9  PLT 212 142*   Cardiac Enzymes: No results for input(s): CKTOTAL, CKMB, CKMBINDEX, TROPONINI in the last 168 hours.  BNP (last 3 results)  Recent Labs  12/09/13 0130  PROBNP 630.1*  CBG: No results for input(s): GLUCAP in the last 168 hours.  Radiological Exams on Admission: Dg Chest 2 View  08/28/2014   CLINICAL DATA:  Initial encounter for dyspnea  EXAM: CHEST  2 VIEW  COMPARISON:  08/08/2014  FINDINGS: AP and lateral views of the chest show no focal airspace consolidation. There is some atelectasis the left base. There is pulmonary vascular congestion without overt pulmonary edema. Small bilateral pleural effusions are evident. The cardio pericardial silhouette is enlarged. Patient is status post CABG right Port-A-Cath tip overlies the mid SVC. Telemetry leads overlie the chest.  IMPRESSION: Cardiomegaly with vascular congestion and small bilateral pleural effusions.   Electronically Signed   By: Misty Stanley M.D.   On: 08/28/2014 11:07    EKG: Independently reviewed. Some T-wave inversion in leads V4 through V6 and slight ST depression V4 through V6.  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 Patient noted on physical exam to be volume overloaded with crackles noted on pulmonary exam and 2-3+ bilateral lower extremity edema. Patient's diuretics was recently resumed by his oncologist on follow-up on 08/23/2014 as was noted that patient did have lower extremity edema. Patient's blood pressure has been borderline and a such will admit to the step down unit for close monitoring. Will check a pro BNP. Repeat EKG. Strict I/O's. Daily weights. We'll cycle cardiac enzymes every 6 hours 3. Check a TSH. Patient with recent 2-D echo done on 08/08/2014 and as such will not repeat 2-D echo. Will place on Lasix 40 mg IV every 12 hours. Continue home regimen of indoor. Continue Pravachol. Will hold off on a beta blocker as patient is acutely decompensated. Follow. If blood pressure remains borderline and issues concerning diuresis or abnormal cardiac enzymes will need to consult with cardiology for further evaluation and management.  #2 urinary tract infection Urine cultures pending. Blood cultures pending. Place on IV Rocephin. Follow.  #3 hypokalemia Likely secondary to nausea and emesis as well as diuretics. Check a magnesium level. Replete.  #4 fever Likely secondary to problem #2. Patient has been pancultured. Continue place on IV Rocephin.  #5 leukocytosis Likely secondary to problem #2. Patient has a pancultured. Continue IV Rocephin.  #6 anemia/thrombocytopenia Anemia likely to recent acute blood loss anemia secondary to hemorrhagic shock during last hospitalization. Thrombocytopenia may likely be  secondary to that versus chemotherapy. Patient currently with no overt signs of bleeding. Follow H&H.  #7 hypertension Continue Inderal.  #8 hyperlipidemia Check a fasting lipid panel. Continue statin.  #9 coronary artery disease status post CABG See problem #1. Continue Imdur, statin. Beta blocker on hold secondary to problem #1. Repeat EKG. No need to repeat 2-D echo at this time. Follow.  #10 chronic kidney disease stage II Stable. Monitor closely with diuretics.  #11 Mantle cell lymphoma/malignant gastric ulcer Will lymphoma oncology of patient's admission via Epic. Continue PPI.  #12 recent  acute CVA Due to patient's recent hemorrhagic shock aspirin and Plavix have been held. Follow for now.  #13 prophylaxis PPI for GI prophylaxis. SCDs for DVT prophylaxis.  Code Status: Full DVT Prophylaxis: SCDs Family Communication: Updated patient and wife at bedside. Disposition Plan: Admit to SDU  Time spent: 35 mins  [facility] MD [facility] Pager [phone]

## 2014-08-28 NOTE — ED Notes (Signed)
Patient began to be short of breath last night. Patient had a fever at home. Patient was found with a 02 saturation of 84 by EMS. History of cancer.

## 2014-08-28 NOTE — ED Notes (Signed)
Nurse will call back for report, she transporting a patient.

## 2014-08-28 NOTE — ED Provider Notes (Signed)
CSN: [id number]     Arrival date & time 08/28/14  1036 History   First MD Initiated Contact with Patient 08/28/14 1134     Chief Complaint  Patient presents with  . Fever  . Shortness of Breath  . Hyperventilating     (Consider location/radiation/quality/duration/timing/severity/associated sxs/prior Treatment) HPI Martin Morris is a 81 y.o. 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 History is obtained from patient's wife. She reports since patient was discharged from hospital on December 18 for acute upper GI bleed,  he has not felt well. Last night she reports that patient told her that his chest was very uncomfortable and they felt like he was having a heart attack. She attributes his discomfort to a banana sandwich that he ate for dinner. She also reports he had increased cough and shortness of breath over the last 2 days. She took his temperature and measured it at 101.5 degrees. Patient does admit to not feeling well and feels weak. Past Medical History  Diagnosis Date  . Hypertension   . CAD 11/12/2007    a. s/p CABG;  b. Cath 7/11 Patent LIMA to the LAD. Previously known occlusion of a saphenous vein graft to diagonal and sequential to obtuse marginals. Severe diffuse native obtuse marginal and diagonal branch vessel disease. Preserved ejection fraction.;  c. 03/2013 Lexi CL: mild inflat and inf ischemia, EF 54%->initial med rx.  Marland Kitchen HYPERLIPIDEMIA 11/12/2007  . HYPERTENSION 11/12/2007  . PERIPHERAL VASCULAR DISEASE 11/12/2007  . ARTHRITIS 11/12/2007  . BENIGN PROSTATIC HYPERTROPHY, HX OF 11/12/2007  . Edema 10/17/2010  . INGUINAL HERNIAS, BILATERAL 11/10/2006  . Obesity, unspecified 07/05/2009  . SLEEP APNEA 11/12/2007  . History of PTCA 2005  . Osteoarthritis   . Carotid stenosis, bilateral   . Nephrolithiasis   . thymus ca dx'd 2003    Radiation comp 2003  . NHL (non-Hodgkin's lymphoma)  dx'd 2008    Chemo comp 10/2010; rituxin comp 10/2010  . 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 MANTLE CELL LYMPHOMA INTRA-ABDOMINAL LYMPH NODES 11/12/2007  . Tricuspid valve regurgitation    Past Surgical History  Procedure Laterality Date  . Coronary artery bypass graft    . Cardiac catheterization  08/2009, 02/2010  . Total hip arthroplasty Left 12/09/2013    Procedure: TOTAL HIP ARTHROPLASTY;  Surgeon: Mauri Pole, MD;  Location: [facility];  Service: Orthopedics;  Laterality: Left;  . Esophagogastroduodenoscopy N/A 08/02/2014    Procedure: ESOPHAGOGASTRODUODENOSCOPY (EGD) at bedside;  Surgeon: Irene Shipper, MD;  Location: [facility];  Service: Endoscopy;  Laterality: N/A;  . Esophagogastroduodenoscopy (egd) with propofol N/A 08/10/2014    Procedure: ESOPHAGOGASTRODUODENOSCOPY (EGD) WITH PROPOFOL;  Surgeon: Milus Banister, MD;  Location: [facility];  Service: Endoscopy;  Laterality: N/A;   Family History  Problem Relation Age of Onset  . Coronary artery disease Mother     died at 33  . Heart attack Sister     died at age 22  . Heart attack Brother    History  Substance Use Topics  . Smoking status: Never Smoker   . Smokeless tobacco: Never Used  . Alcohol Use: No    Review of Systems A 10 point review of systems was completed and was negative except for pertinent positives and negatives as mentioned in the history of present illness    Allergies  Atorvastatin  Home Medications   Prior to Admission medications  Medication Sig Start Date End Date Taking? Authorizing Provider  acyclovir (ZOVIRAX) 400 MG tablet TAKE ONE TABLET BY MOUTH TWICE DAILY Patient taking differently: Take 400 mg by mouth daily. For 6 months then stop 06/20/14  Yes Aasim Marla Roe, MD  atenolol (TENORMIN) 50 MG tablet Take 1 tablet (50 mg total) by mouth daily. 01/12/14  Yes Minus Breeding, MD  citalopram (CELEXA) 20 MG tablet TAKE ONE TABLET BY MOUTH ONCE DAILY 08/12/14  Yes Venetia Maxon Rama, MD   HYDROcodone-acetaminophen (NORCO/VICODIN) 5-325 MG per tablet Take 1-2 tablets by mouth every 6 (six) hours as needed for moderate pain. 12/10/13  Yes Lucille Passy Babish, PA-C  ibrutinib (IMBRUVICA) 140 MG capsul Take 4 capsules (560 mg total) by mouth daily. 08/23/14  Yes Heath Lark, MD  isosorbide mononitrate (IMDUR) 120 MG 24 hr tablet Take 1 tablet (120 mg total) by mouth daily. 01/12/14  Yes Minus Breeding, MD  lactose free nutrition (BOOST PLUS) LIQD Take 237 mLs by mouth 2 (two) times daily as needed (Allow w/pt or pt's wife request after diet advancement). 12/13/13  Yes Annita Brod, MD  lidocaine-prilocaine (EMLA) cream Apply 1 application topically See admin instructions. Apply to port 1 hour before treatment. Cover site with plastic wrap 03/24/14  Yes Concha Norway, MD  nitroGLYCERIN (NITROSTAT) 0.4 MG SL tablet Place 1 tablet (0.4 mg total) under the tongue every 5 (five) minutes as needed for chest pain. 01/12/14  Yes Minus Breeding, MD  pantoprazole (PROTONIX) 40 MG tablet Take 1 tablet (40 mg total) by mouth 2 (two) times daily. 08/12/14  Yes Venetia Maxon Rama, MD  pravastatin (PRAVACHOL) 80 MG tablet Take 1 tablet (80 mg total) by mouth daily. 01/12/14  Yes Minus Breeding, MD  amLODipine (NORVASC) 5 MG tablet Take 1 tablet (5 mg total) by mouth daily. Patient not taking: Reported on 08/23/2014 01/12/14   Minus Breeding, MD  furosemide (LASIX) 20 MG tablet Take one daily as needed for fluid Patient not taking: Reported on 08/23/2014 04/15/14   Minus Breeding, MD  methocarbamol (ROBAXIN) 500 MG tablet Take 1 tablet (500 mg total) by mouth every 6 (six) hours as needed for muscle spasms. Patient not taking: Reported on 08/23/2014 12/10/13   Lucille Passy Babish, PA-C  sulfamethoxazole-trimethoprim (BACTRIM DS,SEPTRA DS) 800-160 MG per tablet Take 1 tablet by mouth every Monday, Wednesday, and Friday. Patient not taking: Reported on 08/02/2014 07/20/14   Aasim Marla Roe, MD   BP 112/62  mmHg  Pulse 83  Temp(Src) 100.9 F (38.3 C) (Oral)  Resp 20  SpO2 90% Physical Exam  Constitutional: He is oriented to person, place, and time. He appears well-developed and well-nourished.  Patient appears pale. Is alert and oriented 4  HENT:  Head: Normocephalic and atraumatic.  Mouth/Throat: Oropharynx is clear and moist.  Eyes: Pupils are equal, round, and reactive to light. Right eye exhibits no discharge. Left eye exhibits no discharge. No scleral icterus.  Pale conjunctiva  Neck: Neck supple.  Cardiovascular: Normal rate, regular rhythm and normal heart sounds.   Pulmonary/Chest: Effort normal. No respiratory distress.  Patient has diffuse, coarse lung sounds at the bases bilaterally. No evidence of respiratory distress.  Abdominal: Soft. He exhibits no distension. There is no tenderness.  Musculoskeletal: He exhibits no tenderness.  Neurological: He is alert and oriented to person, place, and time.  Cranial Nerves II-XII grossly intact  Skin: Skin is warm and dry. No rash noted.  Psychiatric: He has a normal mood and affect.  Nursing  note and vitals reviewed.   ED Course  Procedures (including critical care time) Labs Review Labs Reviewed  CBC WITH DIFFERENTIAL - Abnormal; Notable for the following:    WBC 11.1 (*)    RBC 3.43 (*)    Hemoglobin 10.1 (*)    HCT 32.9 (*)    RDW 17.3 (*)    Platelets 142 (*)    Neutrophils Relative % 91 (*)    Neutro Abs 10.1 (*)    Lymphocytes Relative 4 (*)    Lymphs Abs 0.5 (*)    All other components within normal limits  CULTURE, BLOOD (ROUTINE X 2)  CULTURE, BLOOD (ROUTINE X 2)  URINE CULTURE  COMPREHENSIVE METABOLIC PANEL  URINALYSIS, ROUTINE W REFLEX MICROSCOPIC  I-STAT CG4 LACTIC ACID, ED    Imaging Review Dg Chest 2 View  08/28/2014   CLINICAL DATA:  Initial encounter for dyspnea  EXAM: CHEST  2 VIEW  COMPARISON:  08/08/2014  FINDINGS: AP and lateral views of the chest show no focal airspace consolidation. There is  some atelectasis the left base. There is pulmonary vascular congestion without overt pulmonary edema. Small bilateral pleural effusions are evident. The cardio pericardial silhouette is enlarged. Patient is status post CABG right Port-A-Cath tip overlies the mid SVC. Telemetry leads overlie the chest.  IMPRESSION: Cardiomegaly with vascular congestion and small bilateral pleural effusions.   Electronically Signed   By: Misty Stanley M.D.   On: 08/28/2014 11:07     EKG Interpretation None       Date: 08/29/2014  Rate: 82  Rhythm: normal sinus rhythm  QRS Axis: normal  Intervals: QT prolonged  ST/T Wave abnormalities: nonspecific ST changes  Conduction Disutrbances:none  Narrative Interpretation:   Old EKG Reviewed: none available   Meds given in ED:  Medications  sodium chloride 0.9 % injection (not administered)  cefTRIAXone (ROCEPHIN) 1 g in dextrose 5 % 50 mL IVPB (1 g Intravenous New Bag/Given 08/28/14 1543)  potassium chloride 10 mEq in 100 mL IVPB (0 mEq Intravenous Stopped 08/28/14 1530)  potassium chloride SA (K-DUR,KLOR-CON) CR tablet 60 mEq (60 mEq Oral Given 08/28/14 1308)    New Prescriptions   No medications on file   Filed Vitals:   08/28/14 1218 08/28/14 1522 08/28/14 1523 08/28/14 1533  BP: 109/59 96/54 96/54    Pulse: 76 68 68   Temp: 98.2 F (36.8 C)   98.5 F (36.9 C)  TempSrc: Oral   Oral  Resp: 20 20 16    SpO2: 93% 94% 94%     MDM  MEKHI SONN is a 81 y.o. male with metastatic lymphoma, recent admission and discharge on 12/18 for acute upper GI bleed, here for evaluation of cough, SOB, fever. Febrile on arrival that spontaneously reduced in ED without medication. Reports recently restarting Lasix after DC on 12/18.  Vitals stable - WNL -afebrile Pt resting comfortably in ED. PE--Diffuse, course breath sounds. 2LNC 95%. Benign Abd exam.  Labwork--Leukocytosis 11.1, hypokalemic 2.8. Also evidence of UTI on UA. Imaging--CXR shows vascular congestion  with small bil pleural effusions.  No overt EKG changes due to hypoK  Pt with new oxygen requirement, not on home oxygen. Pt with improving dyspnea in ED, will need admission for further evaluation of potential fluid overload. Began repletion of K in ED, initiated Ceftriaxone for UTI Dr. Grandville Silos, hospitalist, to see in ED. Pt admitted.  Discussed pt presentation and ED course with pt's wife and need for admission for evaluation of SOB and UTI. She  prefers admission and agrees with plan at this time.   Prior to patient discharge, I discussed and reviewed this case with Dr.Kohut    Final diagnoses:  Dyspnea        Verl Dicker, PA-C 08/29/14 1145  Virgel Manifold, MD 09/01/14 [phone]

## 2014-08-28 NOTE — Progress Notes (Signed)
CRITICAL VALUE ALERT  Critical value received:  Trop 2.01  Date of notification:  08/28/2014   Time of notification:  1946  Critical value read back:Yes.    Nurse who received alert:  mk  MD notified (1st page):  NP [facility]  Time of first page:  2000  MD notified (2nd page):  Time of second page:  Responding MD:  NP [facility]  Time MD responded:  2015

## 2014-08-29 ENCOUNTER — Inpatient Hospital Stay (HOSPITAL_COMMUNITY): Payer: Medicare Other

## 2014-08-29 DIAGNOSIS — I5033 Acute on chronic diastolic (congestive) heart failure: Secondary | ICD-10-CM

## 2014-08-29 DIAGNOSIS — I214 Non-ST elevation (NSTEMI) myocardial infarction: Principal | ICD-10-CM

## 2014-08-29 DIAGNOSIS — D6959 Other secondary thrombocytopenia: Secondary | ICD-10-CM

## 2014-08-29 DIAGNOSIS — C8313 Mantle cell lymphoma, intra-abdominal lymph nodes: Secondary | ICD-10-CM

## 2014-08-29 DIAGNOSIS — C8310 Mantle cell lymphoma, unspecified site: Secondary | ICD-10-CM

## 2014-08-29 DIAGNOSIS — R7989 Other specified abnormal findings of blood chemistry: Secondary | ICD-10-CM

## 2014-08-29 DIAGNOSIS — D72828 Other elevated white blood cell count: Secondary | ICD-10-CM

## 2014-08-29 DIAGNOSIS — D630 Anemia in neoplastic disease: Secondary | ICD-10-CM

## 2014-08-29 LAB — CBC WITH DIFFERENTIAL/PLATELET
BASOS ABS: 0 10*3/uL (ref 0.0–0.1)
Basophils Relative: 0 % (ref 0–1)
EOS ABS: 0.2 10*3/uL (ref 0.0–0.7)
Eosinophils Relative: 1 % (ref 0–5)
HEMATOCRIT: 28.8 % — AB (ref 39.0–52.0)
HEMOGLOBIN: 9 g/dL — AB (ref 13.0–17.0)
Lymphocytes Relative: 10 % — ABNORMAL LOW (ref 12–46)
Lymphs Abs: 1.2 10*3/uL (ref 0.7–4.0)
MCH: 30.4 pg (ref 26.0–34.0)
MCHC: 31.3 g/dL (ref 30.0–36.0)
MCV: 97.3 fL (ref 78.0–100.0)
Monocytes Absolute: 0.7 10*3/uL (ref 0.1–1.0)
Monocytes Relative: 6 % (ref 3–12)
Neutro Abs: 9.5 10*3/uL — ABNORMAL HIGH (ref 1.7–7.7)
Neutrophils Relative %: 83 % — ABNORMAL HIGH (ref 43–77)
Platelets: 140 10*3/uL — ABNORMAL LOW (ref 150–400)
RBC: 2.96 MIL/uL — ABNORMAL LOW (ref 4.22–5.81)
RDW: 17.7 % — AB (ref 11.5–15.5)
WBC: 11.6 10*3/uL — ABNORMAL HIGH (ref 4.0–10.5)

## 2014-08-29 LAB — LIPID PANEL
CHOLESTEROL: 85 mg/dL (ref 0–200)
HDL: 26 mg/dL — ABNORMAL LOW (ref >39)
LDL CALC: 34 mg/dL (ref 0–99)
TRIGLYCERIDES: 125 mg/dL (ref <150)
Total CHOL/HDL Ratio: 3.3 RATIO
VLDL: 25 mg/dL (ref 0–40)

## 2014-08-29 LAB — BASIC METABOLIC PANEL
Anion gap: 8 (ref 5–15)
BUN: 24 mg/dL — AB (ref 6–23)
CALCIUM: 7.8 mg/dL — AB (ref 8.4–10.5)
CHLORIDE: 104 meq/L (ref 96–112)
CO2: 28 mmol/L (ref 19–32)
CREATININE: 1.64 mg/dL — AB (ref 0.50–1.35)
GFR calc non Af Amer: 38 mL/min — ABNORMAL LOW (ref >90)
GFR, EST AFRICAN AMERICAN: 44 mL/min — AB (ref >90)
Glucose, Bld: 100 mg/dL — ABNORMAL HIGH (ref 70–99)
Potassium: 4.4 mmol/L (ref 3.5–5.1)
Sodium: 140 mmol/L (ref 135–145)

## 2014-08-29 LAB — TROPONIN I
TROPONIN I: 1.04 ng/mL — AB (ref <0.031)
Troponin I: 1.45 ng/mL (ref <0.031)

## 2014-08-29 LAB — BRAIN NATRIURETIC PEPTIDE: B Natriuretic Peptide: 1890.2 pg/mL — ABNORMAL HIGH (ref 0.0–100.0)

## 2014-08-29 MED ORDER — CETYLPYRIDINIUM CHLORIDE 0.05 % MT LIQD
7.0000 mL | Freq: Two times a day (BID) | OROMUCOSAL | Status: DC
  Administered 2014-08-29 – 2014-09-02 (×9): 7 mL via OROMUCOSAL

## 2014-08-29 MED ORDER — ASPIRIN EC 81 MG PO TBEC
81.0000 mg | DELAYED_RELEASE_TABLET | Freq: Every day | ORAL | Status: DC
  Administered 2014-08-29 – 2014-09-02 (×5): 81 mg via ORAL
  Filled 2014-08-29 (×5): qty 1

## 2014-08-29 NOTE — Progress Notes (Signed)
[facility] PROGRESS NOTE  Martin Morris FWY:[id number] DOB: [birth year] DOA: 08/28/2014 PCP: [name], MD  Assessment/Plan: #1 acute on chronic diastolic CHF exacerbation Questionable etiology. Cardiac enzymes elevated but are trending back down. Patient denies any chest pain. Patient states shortness of breath improved.I/O = -740. Patient's weight today is 92.7 kg from 93.4 kg on admission. Pro BNP is currently at 1890.2 from 702 on admission. Continue IV Lasix. Continue imdur, Pravachol. Continue home dose of atenolol. Consult with cardiology for further evaluation and management.  #2 elevated troponin/probable NSTEMI Patient with elevated troponin and had presented with shortness of breath secondary to acute on chronic diastolic CHF exacerbation. Patient currently denies any chest pain. Due to recent hemorrhagic shock patient's antiplatelet therapy was discontinued. Continue current medical management with atenolol, Lasix,imdur. Patient also with chronic kidney disease.??? Resume baby aspirin versus Plavix. Consult with cardiology for further evaluation and management. Follow.  #3 urinary tract infection Urine cultures pending. Continue IV Rocephin.  #4 hyperlipidemia LDL at goal. Continue Pravachol.  #5 hypertension Stable. Continue atenolol, Lasix,imdur.  #6 hypokalemia Repleted.  #7 fever Likely secondary to urinary tract infection. Patient has been pancultured. Cultures are pending. Continue empiric IV Rocephin.  #8 leukocytosis Likely secondary to UTI. Check a CBC. Urine cultures and blood cultures are pending. Continue empiric IV Rocephin.  #9 anemia/thrombocytopenia Likely secondary to recent hemorrhagic shock with acute blood loss anemia, malignancy, infection and chemotherapy. No overt signs of bleeding. Follow H&H. Patient has been seen by hematology oncology.  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  #11 chronic kidney disease stage II Slight  bump in creatinine. Monitor closely with diuresis.  #12 recent acute CVA Due to patient's recent hemorrhagic shock aspirin and Plavix were discontinued. Due to recent non-STEMI and no further bleeding since discharge on 79/18/2015 will start patient on enteric-coated baby aspirin.  #13 coronary artery disease status post CABG See problem #1. Continue current cardiac medications. Beta blocker has been resumed. Will cancel 2-D echo. Cardiology consulted.  #14 prophylaxis PPI for GI prophylaxis. SCDs for DVT prophylaxis.    Code Status: Full Family Communication: Updated patient and wife at bedside. Disposition Plan: Remain in stepdown   Consultants:  Cardiology pending  Oncology: Dr. Alvy Bimler  Procedures:  2-D echo 08/29/2014  Chest x-ray 08/28/2014, 08/29/2014  Antibiotics:  IV Rocephin 08/28/2014  HPI/Subjective: Patient more alert. Patient states shortness of breath has improved. Patient states he's feeling better.  Objective: Filed Vitals:   08/29/14 0800  BP: 103/55  Pulse: 64  Temp:   Resp: 16    Intake/Output Summary (Last 24 hours) at 08/29/14 1011 Last data filed at 08/29/14 1000  Gross per 24 hour  Intake    820 ml  Output   2410 ml  Net  -1590 ml   Filed Weights   08/28/14 1730 08/29/14 0500  Weight: 93.4 kg (205 lb 14.6 oz) 92.7 kg (204 lb 5.9 oz)    Exam:   General:  NAD  Cardiovascular: RRR  Respiratory: Bibasilar crackles.  Abdomen: Soft, nontender, nondistended, positive bowel sounds.  Musculoskeletal: No clubbing no cyanosis 1-2+ bilateral lower extremity edema.  Data Reviewed: Basic Metabolic Panel:  Recent Labs Lab 08/23/14 1324 08/28/14 1128 08/29/14 0525  NA 144 138 140  K 4.6 2.8* 4.4  CL  --  105 104  CO2 28 27 28   GLUCOSE 92 123* 100*  BUN 11.3 17 24*  CREATININE 1.3 1.40* 1.64*  CALCIUM 8.8 8.2* 7.8*  MG  --  1.6  --  Liver Function Tests:  Recent Labs Lab 08/23/14 1324 08/28/14 1128  AST 19 22   ALT 12 9  ALKPHOS 127 108  BILITOT 0.69 1.1  PROT 5.9* 5.8*  ALBUMIN 3.2* 3.4*   No results for input(s): LIPASE, AMYLASE in the last 168 hours. No results for input(s): AMMONIA in the last 168 hours. CBC:  Recent Labs Lab 08/23/14 1324 08/28/14 1128  WBC 6.7 11.1*  NEUTROABS 3.4 10.1*  HGB 10.1* 10.1*  HCT 32.0* 32.9*  MCV 93.8 95.9  PLT 212 142*   Cardiac Enzymes:  Recent Labs Lab 08/28/14 1826 08/28/14 2330 08/29/14 0525  TROPONINI 2.04* 1.45* 1.04*   BNP (last 3 results)  Recent Labs  12/09/13 0130  PROBNP 630.1*   CBG: No results for input(s): GLUCAP in the last 168 hours.  Recent Results (from the past 240 hour(s))  Culture, blood (routine x 2)     Status: None (Preliminary result)   Collection Time: 08/28/14 11:28 AM  Result Value Ref Range Status   Specimen Description BLOOD LEFT ANTECUBITAL  Final   Special Requests BOTTLES DRAWN AEROBIC ONLY 3 CC   Final   Culture   Final           BLOOD CULTURE RECEIVED NO GROWTH TO DATE CULTURE WILL BE HELD FOR 5 DAYS BEFORE ISSUING A FINAL NEGATIVE REPORT Note: Culture results may be compromised due to an inadequate volume of blood received in culture bottles. Performed at [facility]    Report Status PENDING  Incomplete  Culture, blood (routine x 2)     Status: None (Preliminary result)   Collection Time: 08/28/14 11:29 AM  Result Value Ref Range Status   Specimen Description BLOOD LEFT THUMB  Final   Special Requests BOTTLES DRAWN AEROBIC ONLY 4 CC   Final   Culture   Final           BLOOD CULTURE RECEIVED NO GROWTH TO DATE CULTURE WILL BE HELD FOR 5 DAYS BEFORE ISSUING A FINAL NEGATIVE REPORT Performed at [facility]    Report Status PENDING  Incomplete  MRSA PCR Screening     Status: None   Collection Time: 08/28/14  5:40 PM  Result Value Ref Range Status   MRSA by PCR NEGATIVE NEGATIVE Final    Comment:        The GeneXpert MRSA Assay (FDA approved for NASAL specimens only), is  one component of a comprehensive MRSA colonization surveillance program. It is not intended to diagnose MRSA infection nor to guide or monitor treatment for MRSA infections. Performed at [facility]      Studies: Dg Chest 2 View  08/28/2014   CLINICAL DATA:  Initial encounter for dyspnea  EXAM: CHEST  2 VIEW  COMPARISON:  08/08/2014  FINDINGS: AP and lateral views of the chest show no focal airspace consolidation. There is some atelectasis the left base. There is pulmonary vascular congestion without overt pulmonary edema. Small bilateral pleural effusions are evident. The cardio pericardial silhouette is enlarged. Patient is status post CABG right Port-A-Cath tip overlies the mid SVC. Telemetry leads overlie the chest.  IMPRESSION: Cardiomegaly with vascular congestion and small bilateral pleural effusions.   Electronically Signed   By: Misty Stanley M.D.   On: 08/28/2014 11:07   Dg Chest Port 1 View  08/29/2014   CLINICAL DATA:  81 year old male with shortness of breath. History of hypertension, coronary artery disease, and lymphoma. Subsequent encounter.  EXAM: PORTABLE CHEST - 1 VIEW  COMPARISON:  08/28/2014 chest x-ray.  07/27/2014 PET-CT.  FINDINGS: Right-sided MediPort catheter tip proximal superior vena cava level.  Post CABG.  Cardiomegaly.  Mild pulmonary vascular prominence.  No gross pneumothorax.  PET CT detected pulmonary parenchymal changes which demonstrated FDG uptake not as well delineated on the present plain film examination. Left base parenchymal changes stable.  Calcified tortuous aorta.  Coronary artery calcifications.  IMPRESSION: Post CABG.  Cardiomegaly.  Mild pulmonary vascular prominence.  PET CT detected pulmonary parenchymal changes which demonstrated FDG uptake not as well delineated on the present plain film examination. Left base parenchymal changes appear stable.   Electronically Signed   By: Chauncey Cruel M.D.   On: 08/29/2014 07:33    Scheduled Meds: .  acyclovir  400 mg Oral Daily  . antiseptic oral rinse  7 mL Mouth Rinse BID  . aspirin EC  81 mg Oral Daily  . atenolol  50 mg Oral Daily  . cefTRIAXone (ROCEPHIN)  IV  1 g Intravenous Q24H  . citalopram  20 mg Oral Daily  . furosemide  40 mg Intravenous Q12H  . isosorbide mononitrate  120 mg Oral Daily  . Living Better with Heart Failure Book   Does not apply Once  . pantoprazole  40 mg Oral BID  . pravastatin  80 mg Oral Q2200  . 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 If 7PM-7AM, please contact night-coverage at [email or web address], password [facility] 08/29/2014, 10:11 AM  LOS: 1 day

## 2014-08-29 NOTE — Progress Notes (Signed)
UR complete 

## 2014-08-29 NOTE — Consult Note (Signed)
CARDIOLOGY CONSULT NOTE       Patient ID: Martin Morris MRN: [id number] DOB/AGE: [id number] 79 y.o.  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  No chest pain  Noted to have troponin 2.04 and nonspecific ST changes on ECG Echo done 08/08/14 reviewed EF 55% and mild MR   BNP 702 and CXR with mild vascular congestion.  Last cath done  in August of 2014 demonstrated LAD proximal 90% stenosis, mid long severe diffuse disease. Ostial disease and a moderate first diagonal. Ostial disease in the second diagonal. The nondominant right coronary with 90% stenosis. LIMA to the LAD had 70% stenosis in the body of it. Vein graft to the OM and PDA was occluded. Vein graft to the diagonal is occluded. Managed him medically.  Had complicated hospital stay in December with massive GI bleed from gastric lymphoma  ASA and Plavix stopped. Also had CT noted with lacunar infarcts.  Has port a cath and to begin new chemo agent soon but prognosis from lymphoma is poor.   ROS All other systems reviewed and negative except as noted above  Past Medical History  Diagnosis Date  . Hypertension   . CAD 11/12/2007    a. s/p CABG;  b. Cath 7/11 Patent LIMA to the LAD. Previously known occlusion of a  saphenous vein graft to diagonal and sequential to obtuse marginals. Severe diffuse native obtuse marginal and diagonal branch vessel disease. Preserved ejection fraction.;  c. 03/2013 Lexi CL: mild inflat and inf ischemia, EF 54%->initial med rx.  Marland Kitchen HYPERLIPIDEMIA 11/12/2007  . HYPERTENSION 11/12/2007  . PERIPHERAL VASCULAR DISEASE 11/12/2007  . ARTHRITIS 11/12/2007  . BENIGN PROSTATIC HYPERTROPHY, HX OF 11/12/2007  . Edema 10/17/2010  . INGUINAL HERNIAS, BILATERAL 11/10/2006  . Obesity, unspecified 07/05/2009  . SLEEP APNEA 11/12/2007  . History of PTCA 2005  . Osteoarthritis   . Carotid stenosis, bilateral   . Nephrolithiasis   . thymus ca dx'd 2003    Radiation comp 2003  . NHL (non-Hodgkin's lymphoma) dx'd 2008    Chemo comp 10/2010; rituxin comp 10/2010  . 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 MANTLE CELL LYMPHOMA INTRA-ABDOMINAL LYMPH NODES 11/12/2007  . Tricuspid valve regurgitation     Family History  Problem Relation Age of Onset  . Coronary artery disease Mother     died at 69  . Heart attack Sister     died at age 19  . Heart attack Brother     History   Social History  . Marital Status: Married    Spouse Name: N/A    Number of Children: N/A  . Years of Education: N/A   Occupational History  . Not on file.   Social History Main Topics  . Smoking status: Never Smoker   . Smokeless tobacco: Never Used  .  Alcohol Use: No  . Drug Use: No  . Sexual Activity: Not on file   Other Topics Concern  . Not on file   Social History Narrative   The patient lives in [place] with his wife. He is a retired Engineer, agricultural. HE is accompanied today by his wife and daughter. No tobacco or alcohol use.    Past Surgical History  Procedure Laterality Date  . Coronary artery bypass graft    . Cardiac catheterization  08/2009, 02/2010  . Total hip arthroplasty Left 12/09/2013    Procedure: TOTAL HIP ARTHROPLASTY;  Surgeon: Mauri Pole, MD;  Location: [facility];  Service: Orthopedics;  Laterality: Left;   . Esophagogastroduodenoscopy N/A 08/02/2014    Procedure: ESOPHAGOGASTRODUODENOSCOPY (EGD) at bedside;  Surgeon: Irene Shipper, MD;  Location: [facility];  Service: Endoscopy;  Laterality: N/A;  . Esophagogastroduodenoscopy (egd) with propofol N/A 08/10/2014    Procedure: ESOPHAGOGASTRODUODENOSCOPY (EGD) WITH PROPOFOL;  Surgeon: Milus Banister, MD;  Location: [facility];  Service: Endoscopy;  Laterality: N/A;     . acyclovir  400 mg Oral Daily  . antiseptic oral rinse  7 mL Mouth Rinse BID  . aspirin EC  81 mg Oral Daily  . atenolol  50 mg Oral Daily  . cefTRIAXone (ROCEPHIN)  IV  1 g Intravenous Q24H  . citalopram  20 mg Oral Daily  . furosemide  40 mg Intravenous Q12H  . isosorbide mononitrate  120 mg Oral Daily  . Living Better with Heart Failure Book   Does not apply Once  . pantoprazole  40 mg Oral BID  . pravastatin  80 mg Oral Q2200  . sodium chloride  3 mL Intravenous Q12H      Physical Exam: Blood pressure 103/55, pulse 64, temperature 98.4 F (36.9 C), temperature source Oral, resp. rate 16, height 5\' 9"  (1.753 m), weight 92.7 kg (204 lb 5.9 oz), SpO2 97 %.    Affect appropriate Chronically ill white male  HEENT: port a cath under right clavicle  Neck supple with no adenopathy JVP normal no bruits no thyromegaly Lungs bilateral rales  wheezing and good diaphragmatic motion Heart:  S1/S2 no murmur, no rub, gallop or click PMI normal Abdomen: benighn, BS positve, no tenderness, no AAA no bruit.  No HSM or HJR Distal pulses intact with no bruits No edema Neuro non-focal Skin warm and dry No muscular weakness   Labs:   Lab Results  Component Value Date   WBC 11.1* 08/28/2014   HGB 10.1* 08/28/2014   HCT 32.9* 08/28/2014   MCV 95.9 08/28/2014   PLT 142* 08/28/2014    Recent Labs Lab 08/28/14 1128 08/29/14 0525  NA 138 140  K 2.8* 4.4  CL 105 104  CO2 27 28  BUN 17 24*  CREATININE 1.40* 1.64*  CALCIUM 8.2* 7.8*  PROT 5.8*  --   BILITOT 1.1  --    ALKPHOS 108  --   ALT 9  --   AST 22  --   GLUCOSE 123* 100*   Lab Results  Component Value Date   CKTOTAL 43 03/22/2010   CKMB 4.1* 03/22/2010   TROPONINI 1.04* 08/29/2014    Lab Results  Component Value Date   CHOL 85 08/29/2014   CHOL 125 08/10/2014   CHOL  03/22/2010    167        ATP III CLASSIFICATION:  <200     mg/dL   Desirable  200-239  mg/dL   Borderline High  >=240  mg/dL   High          Lab Results  Component Value Date   HDL 26* 08/29/2014   HDL 12* 08/10/2014   HDL 39* 03/22/2010   Lab Results  Component Value Date   LDLCALC 34 08/29/2014   LDLCALC 51 08/10/2014   LDLCALC * 03/22/2010    100        Total Cholesterol/HDL:CHD Risk Coronary Heart Disease Risk Table                     Men   Women  1/2 Average Risk   3.4   3.3  Average Risk       5.0   4.4  2 X Average Risk   9.6   7.1  3 X Average Risk  23.4   11.0        Use the calculated Patient Ratio above and the CHD Risk Table to determine the patient's CHD Risk.        ATP III CLASSIFICATION (LDL):  <100     mg/dL   Optimal  100-129  mg/dL   Near or Above                    Optimal  130-159  mg/dL   Borderline  160-189  mg/dL   High  >190     mg/dL   Very High   Lab Results  Component Value Date   TRIG 125 08/29/2014   TRIG 311* 08/10/2014   TRIG 139 03/22/2010   Lab Results  Component Value Date   CHOLHDL 3.3 08/29/2014   CHOLHDL 10.4 08/10/2014   CHOLHDL 4.3 03/22/2010   No results found for: LDLDIRECT    Radiology: CXR:  Mild vascular congestion porta cath in good position   EKG:  SR nonspecific ST/T wave changes no acute ST elevation    ASSESSMENT AND PLAN:  SEMI:  Not a candidate for cath lab with inability to anticoagulate, recent advance lymphoma and gastric bleed with Cr 1.6  Add back ASA 81mg  and observe CHF:  Likely more ischemic with recent echo EF 55%  Continue diuresis iv should clear quicly Lymphoma:  Oncology to start new chemo Has access with porta  cath  Prognosis poor may be palliative care in near future  Signed: Jenkins Rouge 08/29/2014, 10:06 AM

## 2014-08-29 NOTE — Progress Notes (Signed)
Martin Morris   DOB:[birth year]   EX#:[id number]   LFY#:[id number]  Patient Care Team: Dione Housekeeper, MD as PCP - General (Family Medicine) Heath Lark, MD as Consulting Physician (Hematology and Oncology) Irene Shipper, MD as Consulting Physician (Gastroenterology)  I have seen the patient, examined him and edited the notes as follows  Subjective: Martin Morris is a 81 year old man with a history of Mantle Cell Lymphoma, about to start on palliative Ibrutinib due to disease recurrence, admitted on 1/3 with non-bloody emesis, fever up to 101.5 without chills or night sweats, oxygen desaturation and shortness of breath. He denied any chest pain, palpitations, cough, nausea, diarrhea, dysuria or hematuria. No other bleeding issues such as epistaxis or hematochezia noted.  He has generalized weakness, but no confusion is reported.  Labs showed renal failure, leukocytosis and urinalysis with present nitrites. Chest X ray was positive for cardiomegaly with vascular congestion and small bilateral pleural effusions. EKG was suspicious for slight ST depression in V4-V6. He was placed on antibiotics with Rocephin after cultures were drawn.  We have been kindly informed of the patient's admission. Since admission, his symptoms of SOB has improved  SUMMARY OF ONCOLOGIC HISTORY:  He was diagnosed with Mantle cell lymphoma diagnosed in March 2008 with positive bone marrow initially on 12/03/2006 and then negative bone marrow after treatment in October 2009.  The patient presented with obstructive liver abnormalities requiring ERCP and non metal stent placement by Dr. Erskine Emery. The stent was subsequently removed in October 2008.  The patient was treated with 8 cycles of Rituxan, Cytoxan, vincristine and Decadron in combination with Neulasta from 12/19/2006 through 05/15/2007. The patient then received maintenance Rituxan from July 16, 2007 through February 01, 2010.  While on Rituxan, he had a recurrence of  his disease by CT scan carried out on 01/24/2010.  Martin Morris then received treatment with bendamustine and Rituxan in combination with Neulasta for 6 cycles from 02/19/2010 through 07/26/2010.  PET scan from 08/22/2010 and CT scans of chest, abdomen, and pelvis from 01/01/2011 that showed no evidence of disease.  He had been off treatment since December 2011 and had been doing well without any symptoms until the CT scan of the abdomen and pelvis on 07/01/2011 showed signs of recurrence. CT scans of abdomen and pelvis with IVC on 09/02/11 showed further progression.  Rituxan, subcutaneous Velcade, intravenous Cytoxan and Decadron were initiated on 10/15/2011. CT scans of the abdomen and pelvis carried out on 01/08/2012 showed a near complete response to therapy.  CT scan of the abdomen and pelvis with IV contrast carried out on 06/19/2012 showed stable plaque-like soft tissue density in the upper abdominal retroperitoneum compared with the CT scan of 01/08/2012.  CT scan of the abdomen and pelvis with IV contrast carried out on 11/30/2012 showed no evidence of recurrent lymphoma.   Previously Velcade, Cytoxan, and Decadron were being administered every 2 weeks, and Rituxan was being administered every 4 weeks. (from 12/04/2012 - 08/05/2013).  Prior to that beginning on 10/15/2011 - 12/04/2012, Velcade, Cytoxan, and Decadron were being administered weekly, and Rituxan was being administered every 2 weeks.  In August of 2014, he was scheduled for chemotherapy but had chest pain and it was held.  He underwent a cardiac cath which revealed two of three native vessel ischemic disease on 04/13/2013 but he was a poor surgical candidate for a re-do CABG. His therapy was re-started in October 2014. Overall he's tolerated his treatment without difficulty.  As of  08/05/2013, treatment program was as follows:  -Velcade 2.75 mg subcu.  -Cytoxan 400 mg IV.  -Decadron 20 mg IV.  The above drugs will be  given every 3 weeks.  -Rituxan 800 mg IV every 6 weeks.  In April he had an accidental fall resulting in a left hip fracture. He underwent L THR on 12/09/2013 by Dr. Paralee Cancel. He was discharged to rehab. Prior to his fall, he had restaging scans including CT C/A/P on 04/09 which demonstrated absence of recurrent lymphoma in the chest, abdomen or pelvis.  Repeat PET/CT scan on 07/27/2014 show significant disease progression. Rituximab was discontinued The patient was admitted to the hospital from 08/02/2014 to 08/12/2014 due to significant GI bleed from malignant tumor in his stomach that was cauterized. Biopsy proven to be recurrence of lymphoma. He was in the intensive care unit due to hemorrhagic shock requiring significant blood transfusion. The patient also developed mild acute encephalopathy with minor neurological deficit which subsequently resolved. He was last seen at the [facility] on 08/23/14. Palliative chemotherapy for mantle cell lymphoma was recommended with Ibrutinib, a Bruton's tyrosine kinase inhibitor, taken as a single agent indefinitely until unexpected side effects or lack of response.   Scheduled Meds: . acyclovir  400 mg Oral Daily  . antiseptic oral rinse  7 mL Mouth Rinse BID  . atenolol  50 mg Oral Daily  . cefTRIAXone (ROCEPHIN)  IV  1 g Intravenous Q24H  . citalopram  20 mg Oral Daily  . furosemide  40 mg Intravenous Q12H  . isosorbide mononitrate  120 mg Oral Daily  . Living Better with Heart Failure Book   Does not apply Once  . pantoprazole  40 mg Oral BID  . pravastatin  80 mg Oral Q2200  . sodium chloride  3 mL Intravenous Q12H   Continuous Infusions:  PRN Meds:sodium chloride, acetaminophen, HYDROcodone-acetaminophen, lactose free nutrition, nitroGLYCERIN, ondansetron (ZOFRAN) IV, sodium chloride   Objective:  Filed Vitals:   08/29/14 0732  BP:   Pulse:   Temp: 98.4 F (36.9 C)  Resp:       Intake/Output Summary (Last 24 hours) at  08/29/14 0753 Last data filed at 08/29/14 0600  Gross per 24 hour  Intake    470 ml  Output   1210 ml  Net   -740 ml    ECOG PERFORMANCE STATUS: 4  GENERAL:Somnolent, no distress and comfortable, chronically ill appearing SKIN: skin color, texture, turgor are normal, no rashes or significant lesions. Chronic venous stasis changes EYES: normal, conjunctiva are pink and non-injected, sclera clear OROPHARYNX:no exudate, no erythema and lips, buccal mucosa, and tongue normal  NECK: supple, thyroid normal size, non-tender, without nodularity LYMPH:  no palpable lymphadenopathy in the cervical, axillary or inguinal LUNGS: Decreased breath sounds at the bases. diffuse rales. No rhonchi or wheezing.  HEART: regular rate & rhythm and no murmurs and no lower extremity edema ABDOMEN:abdomen soft, non-tender and normal bowel sounds Musculoskeletal:no cyanosis of digits and no clubbing  PSYCH: alert & oriented x 3 with fluent speech NEURO: no focal motor/sensory deficits    CBG (last 3)  No results for input(s): GLUCAP in the last 72 hours.   Labs:   Recent Labs Lab 08/23/14 1324 08/28/14 1128  WBC 6.7 11.1*  HGB 10.1* 10.1*  HCT 32.0* 32.9*  PLT 212 142*  MCV 93.8 95.9  MCH 29.7 29.4  MCHC 31.7* 30.7  RDW 18.7* 17.3*  LYMPHSABS 2.1 0.5*  MONOABS 0.7 0.5  EOSABS 0.4  0.0  BASOSABS 0.0 0.0     Chemistries:    Recent Labs Lab 08/23/14 1324 08/28/14 1128 08/29/14 0525  NA 144 138 140  K 4.6 2.8* PENDING  CL  --  105 104  CO2 28 27 28   GLUCOSE 92 123* 100*  BUN 11.3 17 24*  CREATININE 1.3 1.40* 1.64*  CALCIUM 8.8 8.2* 7.8*  MG  --  1.6  --   AST 19 22  --   ALT 12 9  --   ALKPHOS 127 108  --   BILITOT 0.69 1.1  --     GFR Estimated Creatinine Clearance: 41.1 mL/min (by C-G formula based on Cr of 1.64).  Liver Function Tests:  Recent Labs Lab 08/23/14 1324 08/28/14 1128  AST 19 22  ALT 12 9  ALKPHOS 127 108  BILITOT 0.69 1.1  PROT 5.9* 5.8*  ALBUMIN  3.2* 3.4*    Urine Studies     Component Value Date/Time   COLORURINE YELLOW 08/28/2014 1304   APPEARANCEUR TURBID* 08/28/2014 1304   LABSPEC 1.011 08/28/2014 1304   PHURINE 6.0 08/28/2014 1304   GLUCOSEU NEGATIVE 08/28/2014 1304   HGBUR SMALL* 08/28/2014 1304   BILIRUBINUR NEGATIVE 08/28/2014 1304   KETONESUR NEGATIVE 08/28/2014 1304   PROTEINUR 100* 08/28/2014 1304   UROBILINOGEN 0.2 08/28/2014 1304   NITRITE POSITIVE* 08/28/2014 1304   LEUKOCYTESUR LARGE* 08/28/2014 1304    Coagulation profile  Recent Labs Lab 08/28/14 1826  INR 1.19    Cardiac Enzymes:  Recent Labs Lab 08/28/14 1826 08/28/14 2330 08/29/14 0525  TROPONINI 2.04* 1.45* 1.04*  Thyroid function studies  Recent Labs  08/28/14 1826  TSH 3.924   Microbiology Recent Results (from the past 240 hour(s))  MRSA PCR Screening     Status: None   Collection Time: 08/28/14  5:40 PM  Result Value Ref Range Status   MRSA by PCR NEGATIVE NEGATIVE Final    Comment:        The GeneXpert MRSA Assay (FDA approved for NASAL specimens only), is one component of a comprehensive MRSA colonization surveillance program. It is not intended to diagnose MRSA infection nor to guide or monitor treatment for MRSA infections. Performed at [facility]        Imaging Studies:  Dg Chest 2 View  08/28/2014   CLINICAL DATA:  Initial encounter for dyspnea  EXAM: CHEST  2 VIEW  COMPARISON:  08/08/2014  FINDINGS: AP and lateral views of the chest show no focal airspace consolidation. There is some atelectasis the left base. There is pulmonary vascular congestion without overt pulmonary edema. Small bilateral pleural effusions are evident. The cardio pericardial silhouette is enlarged. Patient is status post CABG right Port-A-Cath tip overlies the mid SVC. Telemetry leads overlie the chest.  IMPRESSION: Cardiomegaly with vascular congestion and small bilateral pleural effusions.   Electronically Signed   By: Misty Stanley M.D.   On: 08/28/2014 11:07   Dg Chest Port 1 View  08/29/2014   CLINICAL DATA:  81 year old male with shortness of breath. History of hypertension, coronary artery disease, and lymphoma. Subsequent encounter.  EXAM: PORTABLE CHEST - 1 VIEW  COMPARISON:  08/28/2014 chest x-ray.  07/27/2014 PET-CT.  FINDINGS: Right-sided MediPort catheter tip proximal superior vena cava level.  Post CABG.  Cardiomegaly.  Mild pulmonary vascular prominence.  No gross pneumothorax.  PET CT detected pulmonary parenchymal changes which demonstrated FDG uptake not as well delineated on the present plain film examination. Left base parenchymal changes stable.  Calcified tortuous aorta.  Coronary artery calcifications.  IMPRESSION: Post CABG.  Cardiomegaly.  Mild pulmonary vascular prominence.  PET CT detected pulmonary parenchymal changes which demonstrated FDG uptake not as well delineated on the present plain film examination. Left base parenchymal changes appear stable.   Electronically Signed   By: Chauncey Cruel M.D.   On: 08/29/2014 07:33    Assessment/Plan: 81 y.o.   Mantle Cell Lymphoma Awaiting insurance approval on Ibrutinib due to recurrence of disease (non-formulary) Will not start medication for now due to current hospitalization  Acute on chronic diastolic congestive heart failure exacerbation Cardiology evaluation is pending. Responding well to IV diuresis  Urinary tract infection Patient was noted to have nitrites in urinalysis Cultures were drawn and he was placed on antibiotics with IV Rocephin as per admitting team Will await for results.  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 No transfusion is indicated at this time Monitor counts closely Transfuse blood to maintain a Hb of 8 g or if the patient is acutely  bleeding  Thrombocytopenia In the setting of malignancy and infection No transfusion is indicated at this time as no bleeding issues are present Monitor counts closely  Leukocytosis Fever Likely reactive in the setting of  Infection due to UTI Cultures pending Will continue to monitor and support with antibiotics and antipyretics, IV fluids.   Malnutrition Consider Nutrition evaluation Appreciate Nutrition follow up  Mild acute on Chronic Kidney Disease, stage II (mild) This is chronic in nature, secondary to malignancy Monitor closely  Recent stroke On antiplatelet agents  DVT prophylaxis On mechanical devices  Full Code Other medical issues as per admitting team   Will follow  **Disclaimer: This note was dictated with voice recognition software. Similar sounding words can inadvertently be transcribed and this note may contain transcription errors which may not have been corrected upon publication of note.Sharene Butters E, PA-C 08/29/2014  7:53 AM  Angeles Paolucci, MD 08/29/2014

## 2014-08-29 NOTE — Plan of Care (Signed)
Problem: Phase I Progression Outcomes Goal: Voiding-avoid urinary catheter unless indicated Outcome: Completed/Met Date Met:  08/29/14 Pt using condom catheter

## 2014-08-30 DIAGNOSIS — I10 Essential (primary) hypertension: Secondary | ICD-10-CM

## 2014-08-30 DIAGNOSIS — B9620 Unspecified Escherichia coli [E. coli] as the cause of diseases classified elsewhere: Secondary | ICD-10-CM

## 2014-08-30 DIAGNOSIS — R0600 Dyspnea, unspecified: Secondary | ICD-10-CM

## 2014-08-30 LAB — CBC WITH DIFFERENTIAL/PLATELET
Basophils Absolute: 0 10*3/uL (ref 0.0–0.1)
Basophils Relative: 0 % (ref 0–1)
EOS ABS: 0.4 10*3/uL (ref 0.0–0.7)
EOS PCT: 4 % (ref 0–5)
HEMATOCRIT: 29.4 % — AB (ref 39.0–52.0)
HEMOGLOBIN: 9.2 g/dL — AB (ref 13.0–17.0)
LYMPHS ABS: 1.6 10*3/uL (ref 0.7–4.0)
Lymphocytes Relative: 15 % (ref 12–46)
MCH: 30.3 pg (ref 26.0–34.0)
MCHC: 31.3 g/dL (ref 30.0–36.0)
MCV: 96.7 fL (ref 78.0–100.0)
MONO ABS: 0.8 10*3/uL (ref 0.1–1.0)
Monocytes Relative: 8 % (ref 3–12)
Neutro Abs: 7.6 10*3/uL (ref 1.7–7.7)
Neutrophils Relative %: 73 % (ref 43–77)
PLATELETS: 154 10*3/uL (ref 150–400)
RBC: 3.04 MIL/uL — ABNORMAL LOW (ref 4.22–5.81)
RDW: 17.2 % — ABNORMAL HIGH (ref 11.5–15.5)
WBC: 10.4 10*3/uL (ref 4.0–10.5)

## 2014-08-30 LAB — URINE CULTURE: Colony Count: >=100000

## 2014-08-30 LAB — BASIC METABOLIC PANEL
Anion gap: 9 (ref 5–15)
BUN: 28 mg/dL — ABNORMAL HIGH (ref 6–23)
CO2: 30 mmol/L (ref 19–32)
CREATININE: 1.74 mg/dL — AB (ref 0.50–1.35)
Calcium: 8 mg/dL — ABNORMAL LOW (ref 8.4–10.5)
Chloride: 100 mEq/L (ref 96–112)
GFR calc Af Amer: 41 mL/min — ABNORMAL LOW (ref >90)
GFR calc non Af Amer: 36 mL/min — ABNORMAL LOW (ref >90)
Glucose, Bld: 97 mg/dL (ref 70–99)
Potassium: 3.7 mmol/L (ref 3.5–5.1)
Sodium: 139 mmol/L (ref 135–145)

## 2014-08-30 LAB — BRAIN NATRIURETIC PEPTIDE: B NATRIURETIC PEPTIDE 5: 458.1 pg/mL — AB (ref 0.0–100.0)

## 2014-08-30 MED ORDER — FUROSEMIDE 40 MG PO TABS
40.0000 mg | ORAL_TABLET | Freq: Two times a day (BID) | ORAL | Status: DC
  Administered 2014-08-30 – 2014-09-02 (×6): 40 mg via ORAL
  Filled 2014-08-30 (×6): qty 1

## 2014-08-30 NOTE — Progress Notes (Signed)
[facility] PROGRESS NOTE  Martin Morris DZH:[id number] DOB: [birth year] DOA: 08/28/2014 PCP: [name], MD  Assessment/Plan: #1 acute on chronic diastolic CHF exacerbation Likely secondary to NSTEMI. Cardiac enzymes elevated but are trending back down. Patient denies any chest pain. Patient states shortness of breath improved.I/O = -3805/24HRS. Patient's weight today is 89.5 kg from 92.7 kg from 93.4 kg on admission. Pro BNP is currently at 458.1 from 1890.2 from 702 on admission. Patient with clinical improvement. Creatinine trending up. Patient still volume overloaded clinically. Continue IV Lasix. Continue imdur, Pravachol. Continue home dose of atenolol. ??? Change IV Lasix to oral Lasix however will defer to cardiology. Cardiology following and appreciate input and recommendations.  #2 NSTEMI Patient with elevated troponin and had presented with shortness of breath secondary to acute on chronic diastolic CHF exacerbation. Patient currently denies any chest pain. Due to recent hemorrhagic shock patient's antiplatelet therapy was discontinued. Patient has been seen by cardiology and deemed not a candidate for cath lab with inability to anticoagulate secondary to recent hemorrhagic shock, recent advanced lymphoma and gastric bleed with an elevated creatinine. Continue current medical management with atenolol, Lasix,imdur, ASA. Patient also with chronic kidney disease.??? Cardiology following and appreciate input and recommendations.  #3 Escherichia coli UTI  Urine cultures with Escherichia coli sensitivities pending. Continue IV Rocephin.  #4 hyperlipidemia LDL at goal. Continue Pravachol.  #5 hypertension Stable. Continue atenolol, Lasix,imdur.  #6 hypokalemia Secondary to diureses. Repleted.  #7 fever Likely secondary to urinary tract infection. Currently afebrile. Patient has been pancultured. Urine cultures consistent with Escherichia coli sensitivities pending.  Blood cultures pending. Continue empiric IV Rocephin.  #8 leukocytosis Likely secondary to UTI. WBC trending down. Urine cultures consistent with Escherichia coli sensitivities pending. Continue empiric IV Rocephin.  #9 anemia/thrombocytopenia Likely secondary to recent hemorrhagic shock with acute blood loss anemia, malignancy, infection and chemotherapy. No overt signs of bleeding. Follow H&H. Patient has been seen by hematology oncology.  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  #11 chronic kidney disease stage II Slight bump in creatinine. Monitor closely with diuresis.  #12 recent acute CVA Due to patient's recent hemorrhagic shock aspirin and Plavix were discontinued. Due to recent non-STEMI and no further bleeding since discharge on 08/12/2014. Aspirin has been resumed.   #13 coronary artery disease status post CABG See problem #1. Continue current cardiac medications. Beta blocker and ASA has been resumed. Cardiology following.  #14 prophylaxis PPI for GI prophylaxis. SCDs for DVT prophylaxis.    Code Status: Full Family Communication: Updated patient and wife at bedside. Disposition Plan: Transfer to telemetry   Consultants:  Cardiology pending  Oncology: Dr. Alvy Bimler  Procedures:  Chest x-ray 08/28/2014, 08/29/2014  Antibiotics:  IV Rocephin 08/28/2014  HPI/Subjective: Patient more alert. Patient states shortness of breath has improved. Patient states he's feeling better. No CP. No complaints.  Objective: Filed Vitals:   08/30/14 0600  BP: 134/72  Pulse:   Temp:   Resp: 14    Intake/Output Summary (Last 24 hours) at 08/30/14 0850 Last data filed at 08/30/14 0400  Gross per 24 hour  Intake   1070 ml  Output   4175 ml  Net  -3105 ml   Filed Weights   08/28/14 1730 08/29/14 0500 08/30/14 0400  Weight: 93.4 kg (205 lb 14.6 oz) 92.7 kg (204 lb 5.9 oz) 89.5 kg (197 lb 5 oz)    Exam:   General:  NAD  Cardiovascular: RRR  Respiratory:  Bibasilar crackles.  Abdomen: Soft, nontender, nondistended,  positive bowel sounds.  Musculoskeletal: No clubbing no cyanosis 1+ bilateral lower extremity edema.  Data Reviewed: Basic Metabolic Panel:  Recent Labs Lab 08/23/14 1324 08/28/14 1128 08/29/14 0525 08/30/14 0445  NA 144 138 140 139  K 4.6 2.8* 4.4 3.7  CL  --  105 104 100  CO2 28 27 28 30   GLUCOSE 92 123* 100* 97  BUN 11.3 17 24* 28*  CREATININE 1.3 1.40* 1.64* 1.74*  CALCIUM 8.8 8.2* 7.8* 8.0*  MG  --  1.6  --   --    Liver Function Tests:  Recent Labs Lab 08/23/14 1324 08/28/14 1128  AST 19 22  ALT 12 9  ALKPHOS 127 108  BILITOT 0.69 1.1  PROT 5.9* 5.8*  ALBUMIN 3.2* 3.4*   No results for input(s): LIPASE, AMYLASE in the last 168 hours. No results for input(s): AMMONIA in the last 168 hours. CBC:  Recent Labs Lab 08/23/14 1324 08/28/14 1128 08/29/14 1015 08/30/14 0445  WBC 6.7 11.1* 11.6* 10.4  NEUTROABS 3.4 10.1* 9.5* 7.6  HGB 10.1* 10.1* 9.0* 9.2*  HCT 32.0* 32.9* 28.8* 29.4*  MCV 93.8 95.9 97.3 96.7  PLT 212 142* 140* 154   Cardiac Enzymes:  Recent Labs Lab 08/28/14 1826 08/28/14 2330 08/29/14 0525  TROPONINI 2.04* 1.45* 1.04*   BNP (last 3 results)  Recent Labs  12/09/13 0130  PROBNP 630.1*   CBG: No results for input(s): GLUCAP in the last 168 hours.  Recent Results (from the past 240 hour(s))  Culture, blood (routine x 2)     Status: None (Preliminary result)   Collection Time: 08/28/14 11:28 AM  Result Value Ref Range Status   Specimen Description BLOOD LEFT ANTECUBITAL  Final   Special Requests BOTTLES DRAWN AEROBIC ONLY 3 CC   Final   Culture   Final           BLOOD CULTURE RECEIVED NO GROWTH TO DATE CULTURE WILL BE HELD FOR 5 DAYS BEFORE ISSUING A FINAL NEGATIVE REPORT Note: Culture results may be compromised due to an inadequate volume of blood received in culture bottles. Performed at [facility]    Report Status PENDING  Incomplete  Culture, blood  (routine x 2)     Status: None (Preliminary result)   Collection Time: 08/28/14 11:29 AM  Result Value Ref Range Status   Specimen Description BLOOD LEFT THUMB  Final   Special Requests BOTTLES DRAWN AEROBIC ONLY 4 CC   Final   Culture   Final           BLOOD CULTURE RECEIVED NO GROWTH TO DATE CULTURE WILL BE HELD FOR 5 DAYS BEFORE ISSUING A FINAL NEGATIVE REPORT Performed at [facility]    Report Status PENDING  Incomplete  Urine culture     Status: None (Preliminary result)   Collection Time: 08/28/14  1:05 PM  Result Value Ref Range Status   Specimen Description URINE, CLEAN CATCH  Final   Special Requests NONE  Final   Colony Count   Final    >=100,000 COLONIES/ML Performed at [facility]    Culture   Final    ESCHERICHIA COLI Performed at [facility]    Report Status PENDING  Incomplete  MRSA PCR Screening     Status: None   Collection Time: 08/28/14  5:40 PM  Result Value Ref Range Status   MRSA by PCR NEGATIVE NEGATIVE Final    Comment:        The GeneXpert  MRSA Assay (FDA approved for NASAL specimens only), is one component of a comprehensive MRSA colonization surveillance program. It is not intended to diagnose MRSA infection nor to guide or monitor treatment for MRSA infections. Performed at [facility]      Studies: Dg Chest 2 View  08/28/2014   CLINICAL DATA:  Initial encounter for dyspnea  EXAM: CHEST  2 VIEW  COMPARISON:  08/08/2014  FINDINGS: AP and lateral views of the chest show no focal airspace consolidation. There is some atelectasis the left base. There is pulmonary vascular congestion without overt pulmonary edema. Small bilateral pleural effusions are evident. The cardio pericardial silhouette is enlarged. Patient is status post CABG right Port-A-Cath tip overlies the mid SVC. Telemetry leads overlie the chest.  IMPRESSION: Cardiomegaly with vascular congestion and small bilateral pleural effusions.   Electronically  Signed   By: Misty Stanley M.D.   On: 08/28/2014 11:07   Dg Chest Port 1 View  08/29/2014   CLINICAL DATA:  81 year old male with shortness of breath. History of hypertension, coronary artery disease, and lymphoma. Subsequent encounter.  EXAM: PORTABLE CHEST - 1 VIEW  COMPARISON:  08/28/2014 chest x-ray.  07/27/2014 PET-CT.  FINDINGS: Right-sided MediPort catheter tip proximal superior vena cava level.  Post CABG.  Cardiomegaly.  Mild pulmonary vascular prominence.  No gross pneumothorax.  PET CT detected pulmonary parenchymal changes which demonstrated FDG uptake not as well delineated on the present plain film examination. Left base parenchymal changes stable.  Calcified tortuous aorta.  Coronary artery calcifications.  IMPRESSION: Post CABG.  Cardiomegaly.  Mild pulmonary vascular prominence.  PET CT detected pulmonary parenchymal changes which demonstrated FDG uptake not as well delineated on the present plain film examination. Left base parenchymal changes appear stable.   Electronically Signed   By: Chauncey Cruel M.D.   On: 08/29/2014 07:33    Scheduled Meds: . acyclovir  400 mg Oral Daily  . antiseptic oral rinse  7 mL Mouth Rinse BID  . aspirin EC  81 mg Oral Daily  . atenolol  50 mg Oral Daily  . cefTRIAXone (ROCEPHIN)  IV  1 g Intravenous Q24H  . citalopram  20 mg Oral Daily  . furosemide  40 mg Intravenous Q12H  . isosorbide mononitrate  120 mg Oral Daily  . Living Better with Heart Failure Book   Does not apply Once  . pantoprazole  40 mg Oral BID  . pravastatin  80 mg Oral Q2200  . 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 coli UTI   Fever   Hypokalemia   Leukocytosis   Anemia   Thrombocytopenia    Time spent: 40 mins    [facility] MD [facility] Pager [phone]. If 7PM-7AM, please contact night-coverage at [email or web address], password [facility] 08/30/2014, 8:50 AM  LOS: 2 days

## 2014-08-30 NOTE — Progress Notes (Signed)
Martin Morris   DOB:1933-08-21   HW#:[id number]   KJZ#:[id number]  Patient Care Team: Dione Housekeeper, MD as PCP - General (Family Medicine) Heath Lark, MD as Consulting Physician (Hematology and Oncology) Irene Shipper, MD as Consulting Physician (Gastroenterology)  I have seen the patient, examined him and edited the notes as follows  Subjective: Patient seen and examined.He is afebrile. His shortness of breath has improved. He denies any chest pain or palpitations.  He denies any further emesis. He denies any nausea, abdominal pain or diarrhea. He has intermittent confusion in the setting of recent urinary tract infection.   Scheduled Meds: . acyclovir  400 mg Oral Daily  . antiseptic oral rinse  7 mL Mouth Rinse BID  . aspirin EC  81 mg Oral Daily  . atenolol  50 mg Oral Daily  . cefTRIAXone (ROCEPHIN)  IV  1 g Intravenous Q24H  . citalopram  20 mg Oral Daily  . furosemide  40 mg Intravenous Q12H  . isosorbide mononitrate  120 mg Oral Daily  . Living Better with Heart Failure Book   Does not apply Once  . pantoprazole  40 mg Oral BID  . pravastatin  80 mg Oral Q2200  . sodium chloride  3 mL Intravenous Q12H   Continuous Infusions:  PRN Meds:sodium chloride, acetaminophen, HYDROcodone-acetaminophen, lactose free nutrition, nitroGLYCERIN, ondansetron (ZOFRAN) IV, sodium chloride   Objective:  Filed Vitals:   08/30/14 0400  BP: 126/62  Pulse:   Temp: 98.3 F (36.8 C)  Resp: 17      Intake/Output Summary (Last 24 hours) at 08/30/14 9480 Last data filed at 08/30/14 0400  Gross per 24 hour  Intake   1110 ml  Output   4175 ml  Net  -3065 ml    ECOG PERFORMANCE STATUS: 4  GENERAL:Somnolent, no distress and comfortable, chronically ill appearing SKIN: skin color, texture, turgor are normal, no rashes or significant lesions. Chronic venous stasis changes EYES: normal, conjunctiva are pink and non-injected, sclera clear OROPHARYNX:no exudate, no erythema and lips, buccal  mucosa, and tongue normal  NECK: supple, thyroid normal size, non-tender, without nodularity LYMPH:  no palpable lymphadenopathy in the cervical, axillary or inguinal LUNGS: Decreased breath sounds at the bases. diffuse rales. No rhonchi or wheezing.  HEART: regular rate & rhythm and no murmurs with bilateral lower extremity edema ABDOMEN:abdomen soft, non-tender and normal bowel sounds Musculoskeletal:no cyanosis of digits and no clubbing  PSYCH: alert & oriented x 3 with fluent speech NEURO: no focal motor/sensory deficits    CBG (last 3)  No results for input(s): GLUCAP in the last 72 hours.   Labs:   Recent Labs Lab 08/23/14 1324 08/28/14 1128 08/29/14 1015 08/30/14 0445  WBC 6.7 11.1* 11.6* 10.4  HGB 10.1* 10.1* 9.0* 9.2*  HCT 32.0* 32.9* 28.8* 29.4*  PLT 212 142* 140* 154  MCV 93.8 95.9 97.3 96.7  MCH 29.7 29.4 30.4 30.3  MCHC 31.7* 30.7 31.3 31.3  RDW 18.7* 17.3* 17.7* 17.2*  LYMPHSABS 2.1 0.5* 1.2 1.6  MONOABS 0.7 0.5 0.7 0.8  EOSABS 0.4 0.0 0.2 0.4  BASOSABS 0.0 0.0 0.0 0.0     Chemistries:    Recent Labs Lab 08/23/14 1324 08/28/14 1128 08/29/14 0525 08/30/14 0445  NA 144 138 140 139  K 4.6 2.8* 4.4 3.7  CL  --  105 104 100  CO2 28 27 28 30   GLUCOSE 92 123* 100* 97  BUN 11.3 17 24* 28*  CREATININE 1.3 1.40* 1.64* 1.74*  CALCIUM 8.8 8.2* 7.8* 8.0*  MG  --  1.6  --   --   AST 19 22  --   --   ALT 12 9  --   --   ALKPHOS 127 108  --   --   BILITOT 0.69 1.1  --   --     GFR Estimated Creatinine Clearance: 38.1 mL/min (by C-G formula based on Cr of 1.74).  Liver Function Tests:  Recent Labs Lab 08/23/14 1324 08/28/14 1128  AST 19 22  ALT 12 9  ALKPHOS 127 108  BILITOT 0.69 1.1  PROT 5.9* 5.8*  ALBUMIN 3.2* 3.4*    Coagulation profile  Recent Labs Lab 08/28/14 1826  INR 1.19    Cardiac Enzymes:  Recent Labs Lab 08/28/14 1826 08/28/14 2330 08/29/14 0525  TROPONINI 2.04* 1.45* 1.04*  Thyroid function studies  Recent  Labs  08/28/14 1826  TSH 3.924   Microbiology E coli in urine Blood cultures negative to date   Imaging Studies:  None today  Assessment/Plan: 80 y.o.   Mantle Cell Lymphoma He had insurance approval on Ibrutinib  Will not start medication for now due to current hospitalization  Acute on chronic diastolic congestive heart failure exacerbation Appreciate Cardiology evaluation. Per report, he is not a candidate for catheterization due to multiple comorbidities including anticoagulation issues,history of gastric bleed and renal failure. Aspirin 81 mg was added Responding well to IV diuresis   E. 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 No transfusion is indicated at this time Monitor counts closely Transfuse blood to maintain a Hb of 8 g or if the patient is acutely bleeding  Thrombocytopenia In the setting of malignancy and infection No transfusion is indicated at this time as no bleeding issues are present Resolved as of 1/5 Continue to monitor  Leukocytosis Fever Likely reactive in the setting of  Infection due to UTI Blood cultures negative to date, and positive for E Coli in urine Fever has resolved and leukocytosis is trending to normal values Continue to support with antibiotics and antipyretics, IV fluids.   Malnutrition Consider nutrition evaluation  Mild acute on Chronic Kidney Disease, stage II (mild) This is chronic in nature, secondary to malignancy Monitor closely  Recent stroke On antiplatelet agents  DVT prophylaxis On mechanical devices  Full Code Other medical issues as per admitting team   Will  follow  **Disclaimer: This note was dictated with voice recognition software. Similar sounding words can inadvertently be transcribed and this note may contain transcription errors which may not have been corrected upon publication of note.Sharene Butters E, PA-C 08/30/2014  7:02 AM  Cindia Hustead, MD 08/30/2014

## 2014-08-30 NOTE — Progress Notes (Signed)
Subjective:  No chest pain or SOB  Objective:   Vital Signs in the last 24 hours: Temp:  [97.9 F (36.6 C)-99.5 F (37.5 C)] 98.3 F (36.8 C) (01/05 0400) Pulse Rate:  [65-66] 66 (01/04 1400) Resp:  [12-22] 14 (01/05 0800) BP: (91-137)/(43-72) 128/63 mmHg (01/05 0800) SpO2:  [92 %-99 %] 96 % (01/05 0800) Weight:  [197 lb 5 oz (89.5 kg)] 197 lb 5 oz (89.5 kg) (01/05 0400)  Intake/Output from previous day: 01/04 0701 - 01/05 0700 In: 1110 [P.O.:790; I.V.:90; IV Piggyback:50] Out: 2637 [Urine:4175] Net: -8588  I/O since admission: -4905  Medications: . acyclovir  400 mg Oral Daily  . antiseptic oral rinse  7 mL Mouth Rinse BID  . aspirin EC  81 mg Oral Daily  . atenolol  50 mg Oral Daily  . cefTRIAXone (ROCEPHIN)  IV  1 g Intravenous Q24H  . citalopram  20 mg Oral Daily  . furosemide  40 mg Intravenous Q12H  . isosorbide mononitrate  120 mg Oral Daily  . Living Better with Heart Failure Book   Does not apply Once  . pantoprazole  40 mg Oral BID  . pravastatin  80 mg Oral Q2200  . sodium chloride  3 mL Intravenous Q12H       Physical Exam:   General appearance: alert, cooperative and no distress Neck: no JVD, supple, symmetrical, trachea midline and thyroid not enlarged, symmetric, no tenderness/mass/nodules Lungs: no wheezing,  Decreased BS at bases Heart: regular rate and rhythm Abdomen: soft, non-tender; bowel sounds normal; no masses,  no organomegaly Extremities: trace bilateral edema Neurologic: Grossly normal   Rate: 65  Rhythm: normal sinus rhythm  ECG (independently read by me): NSR at 82 with NSSTT changes, PAC  Lab Results:  BMP Latest Ref Rng 08/30/2014 08/29/2014 08/28/2014  Glucose 70 - 99 mg/dL 97 100(H) 123(H)  BUN 6 - 23 mg/dL 28(H) 24(H) 17  Creatinine 0.50 - 1.35 mg/dL 1.74(H) 1.64(H) 1.40(H)  Sodium 135 - 145 mmol/L 139 140 138  Potassium 3.5 - 5.1 mmol/L 3.7 4.4 2.8(L)  Chloride 96 - 112 mEq/L 100 104 105  CO2 19 - 32 mmol/L 30 28 27     Calcium 8.4 - 10.5 mg/dL 8.0(L) 7.8(L) 8.2(L)     CBC Latest Ref Rng 08/30/2014 08/29/2014 08/28/2014  WBC 4.0 - 10.5 K/uL 10.4 11.6(H) 11.1(H)  Hemoglobin 13.0 - 17.0 g/dL 9.2(L) 9.0(L) 10.1(L)  Hematocrit 39.0 - 52.0 % 29.4(L) 28.8(L) 32.9(L)  Platelets 150 - 400 K/uL 154 140(L) 142(L)      Recent Labs  08/28/14 2330 08/29/14 0525  TROPONINI 1.45* 1.04*    Hepatic Function Panel  Recent Labs  08/28/14 1128  PROT 5.8*  ALBUMIN 3.4*  AST 22  ALT 9  ALKPHOS 108  BILITOT 1.1    Recent Labs  08/28/14 1826  INR 1.19   BNP (last 3 results)  Recent Labs  12/09/13 0130  PROBNP 630.1*    Lipid Panel     Component Value Date/Time   CHOL 85 08/29/2014 0525   TRIG 125 08/29/2014 0525   HDL 26* 08/29/2014 0525   CHOLHDL 3.3 08/29/2014 0525   VLDL 25 08/29/2014 0525   LDLCALC 34 08/29/2014 0525      Imaging:  Dg Chest 2 View  08/28/2014   CLINICAL DATA:  Initial encounter for dyspnea  EXAM: CHEST  2 VIEW  COMPARISON:  08/08/2014  FINDINGS: AP and lateral views of the chest show no focal airspace consolidation. There is  some atelectasis the left base. There is pulmonary vascular congestion without overt pulmonary edema. Small bilateral pleural effusions are evident. The cardio pericardial silhouette is enlarged. Patient is status post CABG right Port-A-Cath tip overlies the mid SVC. Telemetry leads overlie the chest.  IMPRESSION: Cardiomegaly with vascular congestion and small bilateral pleural effusions.   Electronically Signed   By: Misty Stanley M.D.   On: 08/28/2014 11:07   Dg Chest Port 1 View  08/29/2014   CLINICAL DATA:  81 year old male with shortness of breath. History of hypertension, coronary artery disease, and lymphoma. Subsequent encounter.  EXAM: PORTABLE CHEST - 1 VIEW  COMPARISON:  08/28/2014 chest x-ray.  07/27/2014 PET-CT.  FINDINGS: Right-sided MediPort catheter tip proximal superior vena cava level.  Post CABG.  Cardiomegaly.  Mild pulmonary vascular  prominence.  No gross pneumothorax.  PET CT detected pulmonary parenchymal changes which demonstrated FDG uptake not as well delineated on the present plain film examination. Left base parenchymal changes stable.  Calcified tortuous aorta.  Coronary artery calcifications.  IMPRESSION: Post CABG.  Cardiomegaly.  Mild pulmonary vascular prominence.  PET CT detected pulmonary parenchymal changes which demonstrated FDG uptake not as well delineated on the present plain film examination. Left base parenchymal changes appear stable.   Electronically Signed   By: Chauncey Cruel M.D.   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 coli UTI   Fever   Hypokalemia   Leukocytosis   Anemia   Thrombocytopenia   Elevated troponin   NSTEMI (non-ST elevated myocardial infarction)  1. CAD: s/p CABG with occ SVG to dx; occ vein to Om and PDA. No angina  2. NSTEMI; continue nitrates/BB;  No AC with recent GI bleed, on ASA 3. Renal insuf with Cr today inc to 1.74; GFR 36 c/w stage 3 CKD;  He received 40 mg IV bid yesterday and iv dose this am with brisk diuresis; will change to oral dosing starting with evening dose tonight. 4. Anemia 5. Acute on chronic CHF combined with BNP 458 6. HLD;  With LDL 34 and total Chol 85 on pravastatin 80 mg will decrease dose to 40 mg. 7. HypoK improved today with K 3.7 from 2.8    Troy Sine, MD, [facility] 08/30/2014, 9:15 AM

## 2014-08-31 ENCOUNTER — Telehealth: Payer: Self-pay | Admitting: *Deleted

## 2014-08-31 DIAGNOSIS — I2510 Atherosclerotic heart disease of native coronary artery without angina pectoris: Secondary | ICD-10-CM

## 2014-08-31 DIAGNOSIS — N183 Chronic kidney disease, stage 3 (moderate): Secondary | ICD-10-CM

## 2014-08-31 LAB — CBC WITH DIFFERENTIAL/PLATELET
BASOS ABS: 0 10*3/uL (ref 0.0–0.1)
Basophils Relative: 0 % (ref 0–1)
Eosinophils Absolute: 0.6 10*3/uL (ref 0.0–0.7)
Eosinophils Relative: 7 % — ABNORMAL HIGH (ref 0–5)
HEMATOCRIT: 36.2 % — AB (ref 39.0–52.0)
Hemoglobin: 11.2 g/dL — ABNORMAL LOW (ref 13.0–17.0)
LYMPHS ABS: 1.6 10*3/uL (ref 0.7–4.0)
LYMPHS PCT: 18 % (ref 12–46)
MCH: 29.7 pg (ref 26.0–34.0)
MCHC: 30.9 g/dL (ref 30.0–36.0)
MCV: 96 fL (ref 78.0–100.0)
MONO ABS: 1 10*3/uL (ref 0.1–1.0)
MONOS PCT: 11 % (ref 3–12)
Neutro Abs: 6 10*3/uL (ref 1.7–7.7)
Neutrophils Relative %: 65 % (ref 43–77)
PLATELETS: 191 10*3/uL (ref 150–400)
RBC: 3.77 MIL/uL — AB (ref 4.22–5.81)
RDW: 16.4 % — AB (ref 11.5–15.5)
WBC: 9.3 10*3/uL (ref 4.0–10.5)

## 2014-08-31 LAB — MAGNESIUM
MAGNESIUM: 0.2 mg/dL — AB (ref 1.5–2.5)
Magnesium: 2.3 mg/dL (ref 1.5–2.5)

## 2014-08-31 LAB — BASIC METABOLIC PANEL
ANION GAP: 12 (ref 5–15)
BUN: 25 mg/dL — ABNORMAL HIGH (ref 6–23)
CO2: 33 mmol/L — AB (ref 19–32)
Calcium: 8.9 mg/dL (ref 8.4–10.5)
Chloride: 98 mEq/L (ref 96–112)
Creatinine, Ser: 1.49 mg/dL — ABNORMAL HIGH (ref 0.50–1.35)
GFR calc Af Amer: 50 mL/min — ABNORMAL LOW (ref >90)
GFR, EST NON AFRICAN AMERICAN: 43 mL/min — AB (ref >90)
Glucose, Bld: 102 mg/dL — ABNORMAL HIGH (ref 70–99)
Potassium: 3.8 mmol/L (ref 3.5–5.1)
Sodium: 143 mmol/L (ref 135–145)

## 2014-08-31 LAB — BRAIN NATRIURETIC PEPTIDE: B Natriuretic Peptide: 394.4 pg/mL — ABNORMAL HIGH (ref 0.0–100.0)

## 2014-08-31 MED ORDER — MAGNESIUM SULFATE 2 GM/50ML IV SOLN
2.0000 g | Freq: Once | INTRAVENOUS | Status: AC
  Administered 2014-08-31: 2 g via INTRAVENOUS
  Filled 2014-08-31: qty 50

## 2014-08-31 MED ORDER — NITROFURANTOIN MONOHYD MACRO 100 MG PO CAPS
100.0000 mg | ORAL_CAPSULE | Freq: Two times a day (BID) | ORAL | Status: DC
  Administered 2014-08-31 – 2014-09-01 (×3): 100 mg via ORAL
  Filled 2014-08-31 (×4): qty 1

## 2014-08-31 MED ORDER — ATENOLOL 50 MG PO TABS
75.0000 mg | ORAL_TABLET | Freq: Every day | ORAL | Status: DC
  Administered 2014-08-31 – 2014-09-02 (×3): 75 mg via ORAL
  Filled 2014-08-31 (×4): qty 1

## 2014-08-31 MED ORDER — SODIUM CHLORIDE 0.9 % IJ SOLN
10.0000 mL | INTRAMUSCULAR | Status: DC | PRN
  Administered 2014-08-31 – 2014-09-02 (×3): 10 mL
  Filled 2014-08-31 (×2): qty 40

## 2014-08-31 MED ORDER — PRAVASTATIN SODIUM 40 MG PO TABS
40.0000 mg | ORAL_TABLET | Freq: Every day | ORAL | Status: DC
  Administered 2014-08-31 – 2014-09-01 (×2): 40 mg via ORAL
  Filled 2014-08-31 (×3): qty 1

## 2014-08-31 NOTE — Evaluation (Signed)
Occupational Therapy Evaluation Patient Details Name: Martin Morris MRN: [id number] DOB: 1932-12-31 Today's Date: 08/31/2014    History of Present Illness Martin Morris is a 81 y.o. 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 History is obtained from patient's wife.   Clinical Impression   Pt admitted with CHF. Pt currently with functional limitations due to the deficits listed below (see OT Problem List).  Pt will benefit from skilled OT to increase their safety and independence with ADL and functional mobility for ADL to facilitate discharge to venue listed below.      Follow Up Recommendations  No OT follow up    Equipment Recommendations  None recommended by OT       Precautions / Restrictions Precautions Precautions: Fall Restrictions Weight Bearing Restrictions: No      Mobility Bed Mobility Overal bed mobility: Needs Assistance Bed Mobility: Supine to Sit     Supine to sit: Min assist        Transfers Overall transfer level: Needs assistance Equipment used: 1 person hand held assist Transfers: Sit to/from Stand Sit to Stand: Min guard Stand pivot transfers: Min guard       General transfer comment: pt did lose balance posteriorly - but was able to self correct    Balance                                            ADL Overall ADL's : Needs assistance/impaired Eating/Feeding: Set up;Sitting   Grooming: Sitting;Set up   Upper Body Bathing: Set up;Sitting   Lower Body Bathing: Moderate assistance;Sit to/from stand   Upper Body Dressing : Set up;Sitting   Lower Body Dressing: Moderate assistance;Sit to/from stand   Toilet Transfer: Minimal assistance   Toileting- Water quality scientist and Hygiene: Sit to/from stand;Minimal assistance               Vision                     Perception     Praxis       Pertinent Vitals/Pain Pain Assessment: No/denies pain     Hand Dominance     Extremity/Trunk Assessment Upper Extremity Assessment Upper Extremity Assessment: Generalized weakness           Communication Communication Communication: No difficulties   Cognition Arousal/Alertness: Awake/alert Behavior During Therapy: WFL for tasks assessed/performed Overall Cognitive Status: Within Functional Limits for tasks assessed                     General Comments       Exercises       Shoulder Instructions      Home Living Family/patient expects to be discharged to:: Private residence Living Arrangements: Spouse/significant other   Type of Home: House Home Access: Stairs to enter [employer] of Steps: 3 Entrance Stairs-Rails: Right Home Layout: One level;Laundry or work area in basement     [employer] Shower/Tub: Tub/shower unit [employer] characteristics: [place]: Environmental consultant - 4 wheels          Prior Functioning/Environment Level of Independence: Independent             OT Diagnosis: Generalized weakness   OT Problem List: Decreased strength;Decreased activity tolerance;Impaired  balance (sitting and/or standing)   OT Treatment/Interventions: Self-care/ADL training;DME and/or AE instruction;Patient/family education    OT Goals(Current goals can be found in the care plan section) Acute Rehab OT Goals Patient Stated Goal: home. get stronger.  OT Goal Formulation: With patient Time For Goal Achievement: 09/14/14 Potential to Achieve Goals: Good ADL Goals Pt Will Perform Grooming: with modified independence;standing Pt Will Transfer to Toilet: with modified independence;regular height toilet Pt Will Perform Toileting - Clothing Manipulation and hygiene: with modified independence;sit to/from stand  OT Frequency: Min 2X/week   Barriers to D/C:               End of Session Nurse Communication: Mobility  status  Activity Tolerance: Patient limited by fatigue Patient left: in chair;with call bell/phone within reach;with family/visitor present   Time: 2518-9842 OT Time Calculation (min): 17 min Charges:  OT General Charges $OT Visit: 1 Procedure OT Evaluation $Initial OT Evaluation Tier I: 1 Procedure OT Treatments $Self Care/Home Management : 8-22 mins G-Codes:    Payton Mccallum D 09/27/14, 11:38 AM

## 2014-08-31 NOTE — Evaluation (Signed)
Physical Therapy Evaluation Patient Details Name: Martin Morris MRN: [id number] DOB: [birth year] Today's Date: 08/31/2014   History of Present Illness  81 year old male with hx of metastatic lymphoma, L THA, PVD admitted for acute on chronic CHF and NSTEMI.  Clinical Impression  Pt admitted with above diagnosis. Pt currently with functional limitations due to the deficits listed below (see PT Problem List).  Pt will benefit from skilled PT to increase their independence and safety with mobility to allow discharge to the venue listed below.   Pt and spouse report pt was working with [name] prior to admission and would like to continue this upon d/c.  Recommended pt continue to use RW for safety upon d/c.     Follow Up Recommendations Home health PT;Supervision/Assistance - 24 hour    Equipment Recommendations  Rolling walker with 5" wheels    Recommendations for Other Services       Precautions / Restrictions Precautions Precautions: Fall      Mobility  Bed Mobility Overal bed mobility: Needs Assistance Bed Mobility: Supine to Sit     Supine to sit: Min assist     General bed mobility comments: pt OOB in recliner  Transfers Overall transfer level: Needs assistance Equipment used: 1 person hand held assist Transfers: Sit to/from Stand Sit to Stand: Min guard Stand pivot transfers: Min guard       General transfer comment: unsteady upon rise however able to stabilize without assist, reached for IV pole to begin ambulation  Ambulation/Gait Ambulation/Gait assistance: Min guard Ambulation Distance (Feet): 120 Feet Assistive device:  (IV pole) Gait Pattern/deviations: Step-through pattern;Decreased stride length;Wide base of support     General Gait Details: used IV pole for more support, recommended continuing to use RW upon d/c for safety (was starting to get away from using RW at home prior to admission), SpO2 95% during gait on room air, observed increased R toe  out during ambulation however pt and spouse report unaware of this at baseline  Stairs            Wheelchair Mobility    Modified Rankin (Stroke Patients Only)       Balance                                             Pertinent Vitals/Pain Pain Assessment: No/denies pain    Home Living Family/patient expects to be discharged to:: Private residence Living Arrangements: Spouse/significant other   Type of Home: House Home Access: Stairs to enter Entrance Stairs-Rails: Right Entrance Stairs-Number of Steps: 3 Home Layout: One level;Laundry or work area in [place]: Environmental consultant - 4 wheels      Prior Function Level of Independence: Independent               Hand Dominance        Extremity/Trunk Assessment   Upper Extremity Assessment: Generalized weakness           Lower Extremity Assessment: Generalized weakness         Communication   Communication: No difficulties  Cognition Arousal/Alertness: Awake/alert Behavior During Therapy: WFL for tasks assessed/performed Overall Cognitive Status: Within Functional Limits for tasks assessed                      General Comments      Exercises  Assessment/Plan    PT Assessment Patient needs continued PT services  PT Diagnosis Difficulty walking;Generalized weakness   PT Problem List Decreased strength;Decreased activity tolerance;Decreased balance;Decreased mobility;Decreased knowledge of use of DME  PT Treatment Interventions DME instruction;Gait training;Functional mobility training;Therapeutic activities;Therapeutic exercise;Patient/family education;Balance training   PT Goals (Current goals can be found in the Care Plan section) Acute Rehab PT Goals Patient Stated Goal: home. get stronger.  PT Goal Formulation: With patient/family Time For Goal Achievement: 09/14/14 Potential to Achieve Goals: Good    Frequency Min 3X/week   Barriers to  discharge        Co-evaluation               End of Session Equipment Utilized During Treatment: Gait belt Activity Tolerance: Patient tolerated treatment well Patient left: in chair;with call bell/phone within reach;with family/visitor present           Time: 8182-9937 PT Time Calculation (min) (ACUTE ONLY): 15 min   Charges:   PT Evaluation $Initial PT Evaluation Tier I: 1 Procedure PT Treatments $Gait Training: 8-22 mins   PT G Codes:        Ariyona Eid,KATHrine E 08/31/2014, 12:41 PM Carmelia Bake, PT, DPT 08/31/2014 Pager: [phone]

## 2014-08-31 NOTE — Progress Notes (Signed)
Subjective:  No chest pain or SOB  Objective:   Vital Signs in the last 24 hours: Temp:  [98.3 F (36.8 C)-98.9 F (37.2 C)] 98.3 F (36.8 C) (01/06 0517) Pulse Rate:  [65-68] 65 (01/06 0517) Resp:  [15-18] 17 (01/06 0517) BP: (106-135)/(55-73) 135/73 mmHg (01/06 0517) SpO2:  [96 %-98 %] 98 % (01/06 0517) Weight:  [189 lb 6 oz (85.9 kg)] 189 lb 6 oz (85.9 kg) (01/06 0517)  Intake/Output from previous day: 01/05 0701 - 01/06 0700 In: 775 [P.O.:480; I.V.:245; IV Piggyback:50] Out: 2025 [Urine:2025] Net: -1250  I/O since admission: -5055  Medications: . acyclovir  400 mg Oral Daily  . antiseptic oral rinse  7 mL Mouth Rinse BID  . aspirin EC  81 mg Oral Daily  . atenolol  50 mg Oral Daily  . cefTRIAXone (ROCEPHIN)  IV  1 g Intravenous Q24H  . citalopram  20 mg Oral Daily  . furosemide  40 mg Oral BID  . isosorbide mononitrate  120 mg Oral Daily  . pantoprazole  40 mg Oral BID  . pravastatin  80 mg Oral Q2200  . sodium chloride  3 mL Intravenous Q12H       Physical Exam:   General appearance: alert, cooperative and no distress Neck: no JVD, supple, symmetrical, trachea midline and thyroid not enlarged, symmetric, no tenderness/mass/nodules Lungs: no wheezing,  Decreased BS at bases Heart: regular rate and rhythm 1-2/sem; no s3. No diastolic murmur Abdomen: soft, non-tender; bowel sounds normal; no masses,  no organomegaly Extremities: resolution of prior edema Neurologic: Grossly normal   Rate: 65  Rhythm: normal sinus rhythm  ECG (independently read by me): NSR at 82 with NSSTT changes, PAC  Lab Results:  BMP Latest Ref Rng 08/31/2014 08/30/2014 08/29/2014  Glucose 70 - 99 mg/dL 102(H) 97 100(H)  BUN 6 - 23 mg/dL 25(H) 28(H) 24(H)  Creatinine 0.50 - 1.35 mg/dL 1.49(H) 1.74(H) 1.64(H)  Sodium 135 - 145 mmol/L 143 139 140  Potassium 3.5 - 5.1 mmol/L 3.8 3.7 4.4  Chloride 96 - 112 mEq/L 98 100 104  CO2 19 - 32 mmol/L 33(H) 30 28  Calcium 8.4 - 10.5 mg/dL 8.9  8.0(L) 7.8(L)     CBC Latest Ref Rng 08/31/2014 08/30/2014 08/29/2014  WBC 4.0 - 10.5 K/uL 9.3 10.4 11.6(H)  Hemoglobin 13.0 - 17.0 g/dL 11.2(L) 9.2(L) 9.0(L)  Hematocrit 39.0 - 52.0 % 36.2(L) 29.4(L) 28.8(L)  Platelets 150 - 400 K/uL 191 154 140(L)      Recent Labs  08/28/14 2330 08/29/14 0525  TROPONINI 1.45* 1.04*    Hepatic Function Panel  Recent Labs  08/28/14 1128  PROT 5.8*  ALBUMIN 3.4*  AST 22  ALT 9  ALKPHOS 108  BILITOT 1.1    Recent Labs  08/28/14 1826  INR 1.19   BNP (last 3 results)  Recent Labs  12/09/13 0130  PROBNP 630.1*   BNP (not Pro-BNP) 394  Lipid Panel     Component Value Date/Time   CHOL 85 08/29/2014 0525   TRIG 125 08/29/2014 0525   HDL 26* 08/29/2014 0525   CHOLHDL 3.3 08/29/2014 0525   VLDL 25 08/29/2014 0525   LDLCALC 34 08/29/2014 0525      Imaging:  No results found.    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 coli UTI   Fever   Hypokalemia   Leukocytosis   Anemia   Thrombocytopenia   Elevated troponin   NSTEMI (non-ST elevated myocardial infarction)   Dyspnea  1. CAD: s/p CABG with occ SVG to dx; occ vein to OM and PDA. No angina on current therapy.  2. NSTEMI; continue nitrates/BB;  No AC with recent GI bleed, on ASA 3. Renal insufficiency:  Cr improved  today from 1.74 to 1.49; GFR 43 c/w stage 3 CKD;  Transitioned to oral lasix with evening dose last night. 4. Anemia; HCT 29.4 => 36.2 5. Acute on chronic CHF combined with BNP 458 improved to 394 6. HLD;  With LDL 34 and total Chol 85 on pravastatin 80 mg dose decreased to 40 mg. 7. HypoK improved today with K  3.8 from 2.8    Troy Sine, MD, [facility] 08/31/2014, 8:34 AM

## 2014-08-31 NOTE — Progress Notes (Signed)
CRITICAL VALUE ALERT  Critical value received:  Magnesium   Date of notification:  08/31/14  Time of notification:  9833  Critical value read back:Yes.    Nurse who received alert:  Wess Botts  MD notified (1st page):  Samtani   Time of first page:  1227  MD notified (2nd page):  Time of second page:  Responding MD:  Verlon Au  Time MD responded:  [phone]

## 2014-08-31 NOTE — Telephone Encounter (Signed)
Ibrutinib is ready to pick up at [facility].  Pt's co-pay is $50.  Attempted to notify wife so she can pick it up before pt d/c'd from hospital.  Left VM on her home number to return nurse's call.  No answer, no VM on her cell phone.

## 2014-08-31 NOTE — Progress Notes (Addendum)
Telemetry called informing me of pt having an 18 beat wide complex PVC's.  Pt unsymptomatic.  MD made aware.  Will continue to monitor closely.

## 2014-08-31 NOTE — Progress Notes (Addendum)
[facility] PROGRESS NOTE  TEJAS SEAWOOD XYI:[id number] DOB: 07/14/1933 DOA: 08/28/2014 PCP: [name], MD  81 y/o ? recent admit 12/8-12/18 with hemorrhagic shock secondary to a UGI bleed [1.5 cm pancake lesion mid stomach body] with complicating acute CVA on MRI 12/14,  h/o mantle cell lymphoma, 10/2006-recent head scan 07/27/14 = diffuse progression of disease, history of anemia of chronic, sleep apnea on CPAP, hyperlipidemia, CK D 2, CAD s/p CABG 2003, last cath = LAD stenosis 90%-medical management-now currently off aspirin/Plavix and did not cardiac catheter candidate secondary to multiple reasons, prior history left femoral neck fracture 11/2013, BMI greater than 30 admitted 08/28/14 acute decompensated hypoxic respiratory failure secondary most likely to acute diastolic heart failure   #1 acute on chronic diastolic CHF exacerbation Likely secondary to NSTEMI. Cardiac enzymes elevated but are trending back down. Patient denies any chest pain. Patient states shortness of breath improved.I/O = --4.6 L Patient's weight today is 85.9 down from 89.5 kg from 92.7 kg from 93.4 kg .   Creatinine trending up.   Continue imdur, Pravachol. Continue home dose of atenolol.  IV Lasix  to oral Lasix 40 bid 1/5 appreciated   #2 NSTEMI c complication of NSVT 18 beats 08/31/13 Patient with elevated troponin + decomp acute on chronic diastolic CHF exacerbation. Patient currently denies any chest pain. Due to recent hemorrhagic shock patient's antiplatelet therapy was discontinued.  Patient has been seen by cardiology and deemed not a candidate for cath lab with inability to anticoagulate secondary to recent hemorrhagic shock, recent advanced lymphoma and gastric bleed with an elevated creatinine.  Had 18 beats Vtach nonsustained 1/6 MAg level was 0.2 at that time  atenolol increased 50-->75 by cardiology 1/6 Continue Lasix,imdur, ASA.   #3 Escherichia coli pyelonephritis Urine cultures with  Escherichia coli sensitivities pending. Transition IV Rocephin--->Macrobid twice a day, stop date 09/01/14  #4 hyperlipidemia LDL at goal. Continue Pravachol.  #5 hypertension Stable. Continue atenolol, Lasix,imdur.  #6 hypokalemia Secondary to diureses. Repleted.  #9 anemia/thrombocytopenia Likely secondary to recent hemorrhagic shock with acute blood loss anemia, malignancy, infection and chemotherapy. No overt signs of bleeding. Follow H&H. Patient has been seen by hematology oncology. Hb currently 9-11 range  #10 mantle cell lymphoma Per oncology.  #11 chronic kidney disease stage II Slight bump in creatinine. Monitor closely with diuresis. Baseline GFR 07/27/14 = 88% Currently 50%  #12 recent acute CVA Due to patient's recent hemorrhagic shock aspirin and Plavix were discontinued. Due to recent non-STEMI and no further bleeding since discharge on 08/12/2014. Aspirin has been resumed.  Cardiology as OP to decide on risks and beneits and when to re-start DuaAP therapy  #14 prophylaxis PPI for GI prophylaxis. SCDs for DVT prophylaxis.    Code Status: Full Family Communication: Updated patient and wife at bedside. Disposition Plan: Transfer to telemetry-recommending [facility] PT/OT, + RW c wheels   Consultants:  Cardiology   Oncology: Dr. Alvy Bimler  Procedures:  Chest x-ray 08/28/2014, 08/29/2014  Antibiotics:  IV Rocephin 08/28/2014-08/31/58  Macrobid 08/31/14  HPI/Subjective:  Doing fair No other issues Ambulated some earlier today No CP  NO SOB  No N/V  Objective: Filed Vitals:   08/31/14 1021  BP: 137/66  Pulse: 64  Temp:   Resp:     Intake/Output Summary (Last 24 hours) at 08/31/14 1322 Last data filed at 08/31/14 0900  Gross per 24 hour  Intake   1135 ml  Output    925 ml  Net    210 ml  Filed Weights   08/29/14 0500 08/30/14 0400 08/31/14 0517  Weight: 92.7 kg (204 lb 5.9 oz) 89.5 kg (197 lb 5 oz) 85.9 kg (189 lb 6 oz)    Exam:   General:   NAD  Cardiovascular: RRR  Respiratory: Bibasilar crackles.  Abdomen: Soft, nontender, nondistended, positive bowel sounds.  Musculoskeletal: No clubbing no cyanosis trace Le edema  Data Reviewed: Basic Metabolic Panel:  Recent Labs Lab 08/28/14 1128 08/29/14 0525 08/30/14 0445 08/31/14 0440  NA 138 140 139 143  K 2.8* 4.4 3.7 3.8  CL 105 104 100 98  CO2 27 28 30  33*  GLUCOSE 123* 100* 97 102*  BUN 17 24* 28* 25*  CREATININE 1.40* 1.64* 1.74* 1.49*  CALCIUM 8.2* 7.8* 8.0* 8.9  MG 1.6  --   --  0.2*   Liver Function Tests:  Recent Labs Lab 08/28/14 1128  AST 22  ALT 9  ALKPHOS 108  BILITOT 1.1  PROT 5.8*  ALBUMIN 3.4*   No results for input(s): LIPASE, AMYLASE in the last 168 hours. No results for input(s): AMMONIA in the last 168 hours. CBC:  Recent Labs Lab 08/28/14 1128 08/29/14 1015 08/30/14 0445 08/31/14 0440  WBC 11.1* 11.6* 10.4 9.3  NEUTROABS 10.1* 9.5* 7.6 6.0  HGB 10.1* 9.0* 9.2* 11.2*  HCT 32.9* 28.8* 29.4* 36.2*  MCV 95.9 97.3 96.7 96.0  PLT 142* 140* 154 191   Cardiac Enzymes:  Recent Labs Lab 08/28/14 1826 08/28/14 2330 08/29/14 0525  TROPONINI 2.04* 1.45* 1.04*   BNP (last 3 results)  Recent Labs  12/09/13 0130  PROBNP 630.1*   CBG: No results for input(s): GLUCAP in the last 168 hours.  Recent Results (from the past 240 hour(s))  Culture, blood (routine x 2)     Status: None (Preliminary result)   Collection Time: 08/28/14 11:28 AM  Result Value Ref Range Status   Specimen Description BLOOD LEFT ANTECUBITAL  Final   Special Requests BOTTLES DRAWN AEROBIC ONLY 3 CC   Final   Culture   Final           BLOOD CULTURE RECEIVED NO GROWTH TO DATE CULTURE WILL BE HELD FOR 5 DAYS BEFORE ISSUING A FINAL NEGATIVE REPORT Note: Culture results may be compromised due to an inadequate volume of blood received in culture bottles. Performed at [facility]    Report Status PENDING  Incomplete  Culture, blood (routine x 2)      Status: None (Preliminary result)   Collection Time: 08/28/14 11:29 AM  Result Value Ref Range Status   Specimen Description BLOOD LEFT THUMB  Final   Special Requests BOTTLES DRAWN AEROBIC ONLY 4 CC   Final   Culture   Final           BLOOD CULTURE RECEIVED NO GROWTH TO DATE CULTURE WILL BE HELD FOR 5 DAYS BEFORE ISSUING A FINAL NEGATIVE REPORT Performed at [facility]    Report Status PENDING  Incomplete  Urine culture     Status: None   Collection Time: 08/28/14  1:05 PM  Result Value Ref Range Status   Specimen Description URINE, CLEAN CATCH  Final   Special Requests NONE  Final   Colony Count   Final    >=100,000 COLONIES/ML Performed at [facility]    Culture   Final    ESCHERICHIA COLI Performed at [facility]    Report Status 08/30/2014 FINAL  Final   Organism ID, Bacteria ESCHERICHIA COLI  Final      Susceptibility   Escherichia coli - MIC*    AMPICILLIN >=32 RESISTANT Resistant     CEFAZOLIN <=4 SENSITIVE Sensitive     CEFTRIAXONE <=1 SENSITIVE Sensitive     CIPROFLOXACIN 1 SENSITIVE Sensitive     GENTAMICIN >=16 RESISTANT Resistant     LEVOFLOXACIN 1 SENSITIVE Sensitive     NITROFURANTOIN <=16 SENSITIVE Sensitive     TOBRAMYCIN 8 INTERMEDIATE Intermediate     TRIMETH/SULFA >=320 RESISTANT Resistant     PIP/TAZO <=4 SENSITIVE Sensitive     * ESCHERICHIA COLI  MRSA PCR Screening     Status: None   Collection Time: 08/28/14  5:40 PM  Result Value Ref Range Status   MRSA by PCR NEGATIVE NEGATIVE Final    Comment:        The GeneXpert MRSA Assay (FDA approved for NASAL specimens only), is one component of a comprehensive MRSA colonization surveillance program. It is not intended to diagnose MRSA infection nor to guide or monitor treatment for MRSA infections. Performed at [facility]      Studies: No results found.  Scheduled Meds: . acyclovir  400 mg Oral Daily  . antiseptic oral rinse  7 mL Mouth Rinse BID  .  aspirin EC  81 mg Oral Daily  . atenolol  75 mg Oral Daily  . cefTRIAXone (ROCEPHIN)  IV  1 g Intravenous Q24H  . citalopram  20 mg Oral Daily  . furosemide  40 mg Oral BID  . isosorbide mononitrate  120 mg Oral Daily  . magnesium sulfate 1 - 4 g bolus IVPB  2 g Intravenous Once  . pantoprazole  40 mg Oral BID  . pravastatin  40 mg Oral Q2200  . 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 coli UTI   Fever   Hypokalemia   Leukocytosis   Anemia   Thrombocytopenia   Elevated troponin   NSTEMI (non-ST elevated myocardial infarction)   Dyspnea    Time spent: 35

## 2014-09-01 ENCOUNTER — Telehealth: Payer: Self-pay | Admitting: *Deleted

## 2014-09-01 LAB — CBC WITH DIFFERENTIAL/PLATELET
Basophils Absolute: 0 10*3/uL (ref 0.0–0.1)
Basophils Relative: 0 % (ref 0–1)
Eosinophils Absolute: 0.5 10*3/uL (ref 0.0–0.7)
Eosinophils Relative: 8 % — ABNORMAL HIGH (ref 0–5)
HCT: 33.9 % — ABNORMAL LOW (ref 39.0–52.0)
HEMOGLOBIN: 10.5 g/dL — AB (ref 13.0–17.0)
LYMPHS PCT: 21 % (ref 12–46)
Lymphs Abs: 1.4 10*3/uL (ref 0.7–4.0)
MCH: 29.6 pg (ref 26.0–34.0)
MCHC: 31 g/dL (ref 30.0–36.0)
MCV: 95.5 fL (ref 78.0–100.0)
MONOS PCT: 12 % (ref 3–12)
Monocytes Absolute: 0.8 10*3/uL (ref 0.1–1.0)
NEUTROS ABS: 4 10*3/uL (ref 1.7–7.7)
Neutrophils Relative %: 59 % (ref 43–77)
Platelets: 218 10*3/uL (ref 150–400)
RBC: 3.55 MIL/uL — ABNORMAL LOW (ref 4.22–5.81)
RDW: 16.1 % — ABNORMAL HIGH (ref 11.5–15.5)
WBC: 6.7 10*3/uL (ref 4.0–10.5)

## 2014-09-01 MED ORDER — ISOSORBIDE MONONITRATE ER 60 MG PO TB24
90.0000 mg | ORAL_TABLET | Freq: Every day | ORAL | Status: DC
  Administered 2014-09-02: 90 mg via ORAL
  Filled 2014-09-01: qty 1

## 2014-09-01 MED ORDER — HYDROCODONE-ACETAMINOPHEN 5-325 MG PO TABS: 1.0000 | ORAL_TABLET | Freq: Four times a day (QID) | ORAL | Status: DC | PRN

## 2014-09-01 NOTE — Telephone Encounter (Signed)
Instructed wife to pick up Ibrutinib at [place].  Informed of co-pay $50.  Do not start until instructed by Dr. Alvy Bimler.   Wife verbalized understanding and states pt may be d/c'd from hospital today.  She will let us know for sure when he is discharged.

## 2014-09-01 NOTE — Progress Notes (Signed)
Vitals retaken.  Blood pressure 108/64. O2 97% on 2L.  MD made aware.  Will continue to monitor pt closely.

## 2014-09-01 NOTE — Progress Notes (Signed)
Occupational Therapy Treatment Patient Details Name: KAIDENCE CALLAWAY MRN: [id number] DOB: 11-May-1933 Today's Date: 09/01/2014    History of present illness 81 year old male with hx of metastatic lymphoma, L THA, PVD admitted for acute on chronic CHF and NSTEMI.   OT comments  Hope to go home today  Follow Up Recommendations  Home health OT    Equipment Recommendations       Recommendations for Other Services      Precautions / Restrictions Precautions Precautions: Fall       Mobility Bed Mobility Overal bed mobility: Needs Assistance Bed Mobility: Supine to Sit     Supine to sit: Min assist        Transfers Overall transfer level: Needs assistance Equipment used: 1 person hand held assist   Sit to Stand: Min guard Stand pivot transfers: Min guard       General transfer comment: pt did lose balance posteriorly - but was able to self correct    Balance                                   ADL Overall ADL's : Needs assistance/impaired Eating/Feeding: Set up;Sitting   Grooming: Standing;Min guard   Upper Body Bathing: Set up;Sitting   Lower Body Bathing: Sit to/from stand;Minimal assistance   Upper Body Dressing : Set up;Sitting   Lower Body Dressing: Moderate assistance;Sit to/from stand   Toilet Transfer: Minimal assistance   Toileting- Water quality scientist and Hygiene: Sit to/from stand;Minimal assistance                Vision                     Perception     Praxis      Cognition   Behavior During Therapy: [facility] for tasks assessed/performed Overall Cognitive Status: Within Functional Limits for tasks assessed                       Extremity/Trunk Assessment               Exercises     Shoulder Instructions       General Comments      Pertinent Vitals/ Pain          Home Living                                          Prior Functioning/Environment               Frequency       Progress Toward Goals  OT Goals(current goals can now be found in the care plan section)  Progress towards OT goals: Progressing toward goals     Plan Discharge plan remains appropriate;Discharge plan needs to be updated    Co-evaluation                 End of Session     Activity Tolerance Patient tolerated treatment well   Patient Left     Nurse Communication Mobility status        Time: 6387-5643 OT Time Calculation (min): 11 min  Charges: OT General Charges $OT Visit: 1 Procedure OT Treatments $Self Care/Home Management : 8-22 mins  Isabela Nardelli, Thereasa Parkin 09/01/2014, 12:44 PM

## 2014-09-01 NOTE — Progress Notes (Signed)
[facility] PROGRESS NOTE  Martin Morris KXF:[id number] DOB: November 04, 1932 DOA: 08/28/2014 PCP: [name], MD  81 y/o ? recent admit 12/8-12/18 with hemorrhagic shock secondary to a UGI bleed [1.5 cm pancake lesion mid stomach body] with complicating acute CVA on MRI 12/14,  h/o mantle cell lymphoma, 10/2006-recent head scan 07/27/14 = diffuse progression of disease, history of anemia of chronic, sleep apnea on CPAP, hyperlipidemia, CK D 2, CAD s/p CABG 2003, last cath = LAD stenosis 90%-medical management-now currently off aspirin/Plavix and did not cardiac catheter candidate secondary to multiple reasons, prior history left femoral neck fracture 11/2013, BMI greater than 30 admitted 08/28/14 acute decompensated hypoxic respiratory failure secondary most likely to acute diastolic heart failure He was diuresed and meds were adjustedby Cardiology   #1 acute on chronic diastolic CHF exacerbation Likely secondary to NSTEMI. Cardiac enzymes elevated but are trending back down. Patient denies any chest pain. Patient states shortness of breath improved.I/O = --4.9 L Patient's weight today is 85.3 down from admit wght 93.4 kg .   Creatinine trending up.   Continue imdur, Pravachol.   IV Lasix  to oral Lasix 40 bid 1/5  #2 NSTEMI c complication of NSVT 18 beats 08/31/13 Patient with elevated troponin + decomp acute on chronic diastolic CHF exacerbation. Patient currently denies any chest pain. Due to recent hemorrhagic shock patient's antiplatelet therapy was discontinued.  Patient has been seen by cardiology and deemed not a candidate for cath lab with inability to anticoagulate secondary to recent hemorrhagic shock, recent advanced lymphoma and gastric bleed with an elevated creatinine.  Had 18 beats Vtach nonsustained 1/6 Mag level was 0.2 at that time-normalizedc IV mag  atenolol increased 50-->75 by cardiology 1/6 Unfortunately complications of Iatrogenic and Orthostatic hypotension on 1/7  c dizzyness I have requested Dr. Claiborne Billings evaluate his meds again [might need to cut back Imdur from 120--->90 mg or change completely to BIDIL]   #3 Escherichia coli pyelonephritis Urine cultures with Escherichia coli sensitivities pending.  Transition IV Rocephin--->Macrobid twice a day, stop date 09/01/14  #4 hyperlipidemia LDL at goal. Continue Pravachol 80  #5 hypertension Stable. Continue atenolol 75 daily See above  #6 hypokalemia Secondary to diureses. Repleted.  #9 anemia/thrombocytopenia Likely secondary to recent hemorrhagic shock with acute blood loss anemia, malignancy, infection and chemotherapy. No overt signs of bleeding. Follow H&H. Patient has been seen by hematology oncology. Hb currently 9-11 range  #10 mantle cell lymphoma Per oncology.  #11 chronic kidney disease stage II Slight bump in creatinine. Monitor closely with diuresis. Baseline GFR 07/27/14 = 88% Currently 50% ACE relative contraindication 2/2 to CKD3-4-may start low dose ACE as OP with careful f/u  #12 recent acute CVA Due to patient's recent hemorrhagic shock aspirin and Plavix were discontinued. Due to recent non-STEMI and no further bleeding since discharge on 08/12/2014.  Aspirin 81 has been resumed.  Cardiology as OP to decide on risks and beneits and when to re-start DuaAP therapy Needs 1 wk f/o reg Cardiologist [place]  #14 prophylaxis PPI for GI prophylaxis. SCDs for DVT prophylaxis.    Code Status: Full Family Communication: Updated patient and wife at bedside. Disposition Plan: Transfer to telemetry   Consultants:  Cardiology   Oncology: Dr. Alvy Bimler  Procedures:  Chest x-ray 08/28/2014, 08/29/2014  Antibiotics:  IV Rocephin 08/28/2014-08/31/58  Macrobid 08/31/14  HPI/Subjective:  Orthostatic/hypoxic moving from chair to bed Now better Sats off O2 were 95%  Able to stand now NO CP Some mental slowing? Tolerated food NO  loose stools  Objective: Filed Vitals:    09/01/14 1444  BP:   Pulse:   Temp: 98.2 F (36.8 C)  Resp:     Intake/Output Summary (Last 24 hours) at 09/01/14 1517 Last data filed at 09/01/14 0900  Gross per 24 hour  Intake   2740 ml  Output   2950 ml  Net   -210 ml   Filed Weights   08/30/14 0400 08/31/14 0517 09/01/14 0649  Weight: 89.5 kg (197 lb 5 oz) 85.9 kg (189 lb 6 oz) 85.3 kg (188 lb 0.8 oz)    Exam:   General:  NAD  Cardiovascular: RRR  Respiratory: Bibasilar crackles.  Abdomen: Soft, nontender, nondistended, positive bowel sounds.  Musculoskeletal: No clubbing no cyanosis trace Le edema  Data Reviewed: Basic Metabolic Panel:  Recent Labs Lab 08/28/14 1128 08/29/14 0525 08/30/14 0445 08/31/14 0440 08/31/14 2123  NA 138 140 139 143  --   K 2.8* 4.4 3.7 3.8  --   CL 105 104 100 98  --   CO2 27 28 30  33*  --   GLUCOSE 123* 100* 97 102*  --   BUN 17 24* 28* 25*  --   CREATININE 1.40* 1.64* 1.74* 1.49*  --   CALCIUM 8.2* 7.8* 8.0* 8.9  --   MG 1.6  --   --  0.2* 2.3   Liver Function Tests:  Recent Labs Lab 08/28/14 1128  AST 22  ALT 9  ALKPHOS 108  BILITOT 1.1  PROT 5.8*  ALBUMIN 3.4*   No results for input(s): LIPASE, AMYLASE in the last 168 hours. No results for input(s): AMMONIA in the last 168 hours. CBC:  Recent Labs Lab 08/28/14 1128 08/29/14 1015 08/30/14 0445 08/31/14 0440 09/01/14 0521  WBC 11.1* 11.6* 10.4 9.3 6.7  NEUTROABS 10.1* 9.5* 7.6 6.0 4.0  HGB 10.1* 9.0* 9.2* 11.2* 10.5*  HCT 32.9* 28.8* 29.4* 36.2* 33.9*  MCV 95.9 97.3 96.7 96.0 95.5  PLT 142* 140* 154 191 218   Cardiac Enzymes:  Recent Labs Lab 08/28/14 1826 08/28/14 2330 08/29/14 0525  TROPONINI 2.04* 1.45* 1.04*   BNP (last 3 results)  Recent Labs  12/09/13 0130  PROBNP 630.1*   CBG: No results for input(s): GLUCAP in the last 168 hours.  Recent Results (from the past 240 hour(s))  Culture, blood (routine x 2)     Status: None (Preliminary result)   Collection Time: 08/28/14 11:28  AM  Result Value Ref Range Status   Specimen Description BLOOD LEFT ANTECUBITAL  Final   Special Requests BOTTLES DRAWN AEROBIC ONLY 3 CC   Final   Culture   Final           BLOOD CULTURE RECEIVED NO GROWTH TO DATE CULTURE WILL BE HELD FOR 5 DAYS BEFORE ISSUING A FINAL NEGATIVE REPORT Note: Culture results may be compromised due to an inadequate volume of blood received in culture bottles. Performed at [facility]    Report Status PENDING  Incomplete  Culture, blood (routine x 2)     Status: None (Preliminary result)   Collection Time: 08/28/14 11:29 AM  Result Value Ref Range Status   Specimen Description BLOOD LEFT THUMB  Final   Special Requests BOTTLES DRAWN AEROBIC ONLY 4 CC   Final   Culture   Final           BLOOD CULTURE RECEIVED NO GROWTH TO DATE CULTURE WILL BE HELD FOR 5 DAYS BEFORE ISSUING A FINAL NEGATIVE REPORT Performed  at [facility]    Report Status PENDING  Incomplete  Urine culture     Status: None   Collection Time: 08/28/14  1:05 PM  Result Value Ref Range Status   Specimen Description URINE, CLEAN CATCH  Final   Special Requests NONE  Final   Colony Count   Final    >=100,000 COLONIES/ML Performed at [facility]    Culture   Final    ESCHERICHIA COLI Performed at [facility]    Report Status 08/30/2014 FINAL  Final   Organism ID, Bacteria ESCHERICHIA COLI  Final      Susceptibility   Escherichia coli - MIC*    AMPICILLIN >=32 RESISTANT Resistant     CEFAZOLIN <=4 SENSITIVE Sensitive     CEFTRIAXONE <=1 SENSITIVE Sensitive     CIPROFLOXACIN 1 SENSITIVE Sensitive     GENTAMICIN >=16 RESISTANT Resistant     LEVOFLOXACIN 1 SENSITIVE Sensitive     NITROFURANTOIN <=16 SENSITIVE Sensitive     TOBRAMYCIN 8 INTERMEDIATE Intermediate     TRIMETH/SULFA >=320 RESISTANT Resistant     PIP/TAZO <=4 SENSITIVE Sensitive     * ESCHERICHIA COLI  MRSA PCR Screening     Status: None   Collection Time: 08/28/14  5:40 PM  Result  Value Ref Range Status   MRSA by PCR NEGATIVE NEGATIVE Final    Comment:        The GeneXpert MRSA Assay (FDA approved for NASAL specimens only), is one component of a comprehensive MRSA colonization surveillance program. It is not intended to diagnose MRSA infection nor to guide or monitor treatment for MRSA infections. Performed at [facility]      Studies: No results found.  Scheduled Meds: . acyclovir  400 mg Oral Daily  . antiseptic oral rinse  7 mL Mouth Rinse BID  . aspirin EC  81 mg Oral Daily  . atenolol  75 mg Oral Daily  . citalopram  20 mg Oral Daily  . furosemide  40 mg Oral BID  . isosorbide mononitrate  120 mg Oral Daily  . nitrofurantoin (macrocrystal-monohydrate)  100 mg Oral Q12H  . pantoprazole  40 mg Oral BID  . pravastatin  40 mg Oral Q2200  . 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 coli UTI   Fever   Hypokalemia   Leukocytosis   Anemia   Thrombocytopenia   Elevated troponin   NSTEMI (non-ST elevated myocardial infarction)   Dyspnea    Time spent: 35

## 2014-09-01 NOTE — Progress Notes (Signed)
Pt. Complaining of SOB when getting back to bed.  O2 was 89%.  Placed pt on 2 units O2, sats up to 96%.  Bp 89/57.  Pt asymptomatic.  MD made aware.  Will continue to monitor closely.

## 2014-09-01 NOTE — Progress Notes (Signed)
CARE MANAGEMENT NOTE 09/01/2014  Patient:  Martin Morris, Martin Morris   Account Number:  [id number]  Date Initiated:  09/01/2014  Documentation initiated by:  Karl Bales  Subjective/Objective Assessment:   PT ADMITTED WITH [facility] SOB WITH CHF     Action/Plan:   from home   Anticipated DC Date:  09/01/2014   Anticipated DC Plan:  [place]  CM consult      Choice offered to / List presented to:          [facility] arranged  HH-1 RN  [place].   Status of service:  In process, will continue to follow Medicare Important Message given?   (If response is "NO", the following Medicare IM given date fields will be blank) Date Medicare IM given:   Medicare IM given by:   Date Additional Medicare IM given:   Additional Medicare IM given by:    Discharge Disposition:    Per UR Regulation:  Reviewed for med. necessity/level of care/duration of stay  If discussed at [place] of Stay Meetings, dates discussed:    Comments:  09/01/14 MMcGibboney, RN, BSN Chart reviewed.  Pt was active and will continue with [facility] HHRN/HHPT. Will need HH orders resumed. Thanks.

## 2014-09-01 NOTE — Progress Notes (Signed)
Physical Therapy Treatment Patient Details Name: Martin Morris MRN: [id number] DOB: [birth year] Today's Date: 09/01/2014    History of Present Illness 81 year old male with hx of metastatic lymphoma, L THA, PVD admitted for acute on chronic CHF and NSTEMI.    PT Comments    Pt in bed with spouse at bedside.  Stated pt just got back to bed and was having SOB.  Pt stated he feels "fine".  Before attempting any activity took vitals, HR 59, O2 96% on 2 lts and BP 84/47 all supine.  Assisted pt to EOB BP was 86/56.  Pt asymptomatic.  RN arrived to room and is aware.  Unable to attempt amb, assisted pt back to supine.    Follow Up Recommendations  Home health PT;Supervision/Assistance - 24 hour     Equipment Recommendations  Rolling walker with 5" wheels    Recommendations for Other Services       Precautions / Restrictions Precautions Precautions: Fall Restrictions Weight Bearing Restrictions: No    Mobility  Bed Mobility Overal bed mobility: Needs Assistance Bed Mobility: Supine to Sit;Sit to Supine     Supine to sit: Min assist Sit to supine: Min assist   General bed mobility comments: assisted pt to EOB then back to supuine due to low BP.  RN aware.  Transfers   Unable to attempt OOB activity due to hypotension       Ambulation/Gait                 Stairs            Wheelchair Mobility    Modified Rankin (Stroke Patients Only)       Balance                                    Cognition Arousal/Alertness: Awake/alert Behavior During Therapy: WFL for tasks assessed/performed Overall Cognitive Status: Within Functional Limits for tasks assessed                      Exercises      General Comments        Pertinent Vitals/Pain Pain Assessment: No/denies pain    Home Living                      Prior Function            PT Goals (current goals can now be found in the care plan section) Progress  towards PT goals: Progressing toward goals    Frequency  Min 3X/week    PT Plan      Co-evaluation             End of Session Equipment Utilized During Treatment: Gait belt Activity Tolerance: Other (comment) (hypotension)       Time: 3536-1443 PT Time Calculation (min) (ACUTE ONLY): 17 min  Charges:  $Therapeutic Activity: 8-22 mins                    G Codes:      Rica Koyanagi  PTA [facility]  Acute  Rehab Pager      [phone]

## 2014-09-01 NOTE — Progress Notes (Signed)
Subjective:  No chest pain or SOB  Objective:   Vital Signs in the last 24 hours: Temp:  [97.6 F (36.4 C)-97.9 F (36.6 C)] 97.6 F (36.4 C) (01/07 0649) Pulse Rate:  [55-64] 60 (01/07 0649) Resp:  [18-20] 20 (01/07 0649) BP: (102-148)/(56-75) 148/75 mmHg (01/07 0649) SpO2:  [93 %-96 %] 96 % (01/07 0649) Weight:  [188 lb 0.8 oz (85.3 kg)] 188 lb 0.8 oz (85.3 kg) (01/07 0649)   Weight 205 ==> 188  Intake/Output from previous day: 01/06 0701 - 01/07 0700 In: 2440 [P.O.:1200; I.V.:1240] Out: 2950 [Urine:2950] Net: -47  I/O since admission: -5565  Medications: . acyclovir  400 mg Oral Daily  . antiseptic oral rinse  7 mL Mouth Rinse BID  . aspirin EC  81 mg Oral Daily  . atenolol  75 mg Oral Daily  . citalopram  20 mg Oral Daily  . furosemide  40 mg Oral BID  . isosorbide mononitrate  120 mg Oral Daily  . nitrofurantoin (macrocrystal-monohydrate)  100 mg Oral Q12H  . pantoprazole  40 mg Oral BID  . pravastatin  40 mg Oral Q2200  . sodium chloride  3 mL Intravenous Q12H       Physical Exam:   General appearance: alert, cooperative and no distress Neck: no JVD, supple, symmetrical, trachea midline and thyroid not enlarged, symmetric, no tenderness/mass/nodules Lungs: no wheezing,  Decreased BS at bases Heart: regular rate and rhythm 1-2/sem; no s3. No diastolic murmur Abdomen: soft, non-tender; bowel sounds normal; no masses,  no organomegaly Extremities: resolution of prior edema Neurologic: Grossly normal   Rate: 58-62  Rhythm: normal sinus rhythm with PAC's  08/28/14 ECG (independently read by me): NSR at 82 with NSSTT changes, PAC  08/31/14/ ECG ( independently read by me): NSR at 62; NSSTT changes  Lab Results:  BMP Latest Ref Rng 08/31/2014 08/30/2014 08/29/2014  Glucose 70 - 99 mg/dL 102(H) 97 100(H)  BUN 6 - 23 mg/dL 25(H) 28(H) 24(H)  Creatinine 0.50 - 1.35 mg/dL 1.49(H) 1.74(H) 1.64(H)  Sodium 135 - 145 mmol/L 143 139 140  Potassium 3.5 - 5.1 mmol/L  3.8 3.7 4.4  Chloride 96 - 112 mEq/L 98 100 104  CO2 19 - 32 mmol/L 33(H) 30 28  Calcium 8.4 - 10.5 mg/dL 8.9 8.0(L) 7.8(L)     CBC Latest Ref Rng 09/01/2014 08/31/2014 08/30/2014  WBC 4.0 - 10.5 K/uL 6.7 9.3 10.4  Hemoglobin 13.0 - 17.0 g/dL 10.5(L) 11.2(L) 9.2(L)  Hematocrit 39.0 - 52.0 % 33.9(L) 36.2(L) 29.4(L)  Platelets 150 - 400 K/uL 218 191 154     No results for input(s): TROPONINI in the last 72 hours.  Invalid input(s): CK, MB  Hepatic Function Panel No results for input(s): PROT, ALBUMIN, AST, ALT, ALKPHOS, BILITOT, BILIDIR, IBILI in the last 72 hours. No results for input(s): INR in the last 72 hours. BNP (last 3 results)  Recent Labs  12/09/13 0130  PROBNP 630.1*   BNP (not Pro-BNP) 394  Lipid Panel     Component Value Date/Time   CHOL 85 08/29/2014 0525   TRIG 125 08/29/2014 0525   HDL 26* 08/29/2014 0525   CHOLHDL 3.3 08/29/2014 0525   VLDL 25 08/29/2014 0525   LDLCALC 34 08/29/2014 0525      Imaging:  No results found.    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 coli UTI   Fever   Hypokalemia   Leukocytosis   Anemia   Thrombocytopenia   Elevated troponin   NSTEMI (non-ST elevated myocardial infarction)   Dyspnea  1. CAD: s/p CABG with occ SVG to dx; occ vein to OM and PDA. No angina on current therapy.  2. NSTEMI; continue nitrates/BB;  HR currently 58 - 60; continue BB at present dose but if HR decreases consistently below 54 can decrease dose if needed. No AC with recent GI bleed, on ASA 3. Renal insufficiency:  Cr improved  Yesterday from 1.74 to 1.49; GFR 43 c/w stage 3 CKD;  Now on oral  lasix. Labs pending from today.   4. Anemia; HCT 29.4 => 36.2 5. Acute on chronic CHF combined with BNP 458 improved to 394 6. HLD;  With LDL 34 and total Chol 85 on pravastatin 80 mg dose decreased to 40 mg. 7. HypoK improved  with K 3.8 from 2.8    Troy Sine, MD, [facility] 09/01/2014, 7:26 AM

## 2014-09-02 DIAGNOSIS — I472 Ventricular tachycardia: Secondary | ICD-10-CM

## 2014-09-02 LAB — CBC WITH DIFFERENTIAL/PLATELET
BASOS ABS: 0 10*3/uL (ref 0.0–0.1)
BASOS PCT: 0 % (ref 0–1)
Eosinophils Absolute: 0.4 10*3/uL (ref 0.0–0.7)
Eosinophils Relative: 6 % — ABNORMAL HIGH (ref 0–5)
HCT: 33.1 % — ABNORMAL LOW (ref 39.0–52.0)
HEMOGLOBIN: 10.5 g/dL — AB (ref 13.0–17.0)
LYMPHS PCT: 21 % (ref 12–46)
Lymphs Abs: 1.6 10*3/uL (ref 0.7–4.0)
MCH: 29.9 pg (ref 26.0–34.0)
MCHC: 31.7 g/dL (ref 30.0–36.0)
MCV: 94.3 fL (ref 78.0–100.0)
MONOS PCT: 15 % — AB (ref 3–12)
Monocytes Absolute: 1.1 10*3/uL — ABNORMAL HIGH (ref 0.1–1.0)
Neutro Abs: 4.2 10*3/uL (ref 1.7–7.7)
Neutrophils Relative %: 58 % (ref 43–77)
Platelets: 197 10*3/uL (ref 150–400)
RBC: 3.51 MIL/uL — ABNORMAL LOW (ref 4.22–5.81)
RDW: 16.4 % — ABNORMAL HIGH (ref 11.5–15.5)
WBC: 7.3 10*3/uL (ref 4.0–10.5)

## 2014-09-02 LAB — COMPREHENSIVE METABOLIC PANEL
ALBUMIN: 3.1 g/dL — AB (ref 3.5–5.2)
ALK PHOS: 101 U/L (ref 39–117)
ALT: 14 U/L (ref 0–53)
AST: 24 U/L (ref 0–37)
Anion gap: 10 (ref 5–15)
BILIRUBIN TOTAL: 0.7 mg/dL (ref 0.3–1.2)
BUN: 27 mg/dL — ABNORMAL HIGH (ref 6–23)
CALCIUM: 8.7 mg/dL (ref 8.4–10.5)
CO2: 35 mmol/L — ABNORMAL HIGH (ref 19–32)
CREATININE: 1.58 mg/dL — AB (ref 0.50–1.35)
Chloride: 96 mEq/L (ref 96–112)
GFR calc Af Amer: 46 mL/min — ABNORMAL LOW (ref >90)
GFR, EST NON AFRICAN AMERICAN: 40 mL/min — AB (ref >90)
Glucose, Bld: 119 mg/dL — ABNORMAL HIGH (ref 70–99)
Potassium: 3.3 mmol/L — ABNORMAL LOW (ref 3.5–5.1)
Sodium: 141 mmol/L (ref 135–145)
TOTAL PROTEIN: 5.9 g/dL — AB (ref 6.0–8.3)

## 2014-09-02 LAB — MAGNESIUM: MAGNESIUM: 1.9 mg/dL (ref 1.5–2.5)

## 2014-09-02 MED ORDER — ISOSORBIDE MONONITRATE ER 30 MG PO TB24: 90.0000 mg | ORAL_TABLET | Freq: Every day | ORAL | Status: DC

## 2014-09-02 MED ORDER — POTASSIUM CHLORIDE CRYS ER 20 MEQ PO TBCR
40.0000 meq | EXTENDED_RELEASE_TABLET | Freq: Once | ORAL | Status: AC
  Administered 2014-09-02: 40 meq via ORAL
  Filled 2014-09-02: qty 2

## 2014-09-02 MED ORDER — POTASSIUM CHLORIDE CRYS ER 20 MEQ PO TBCR: 40.0000 meq | EXTENDED_RELEASE_TABLET | Freq: Two times a day (BID) | ORAL | Status: DC

## 2014-09-02 MED ORDER — ATENOLOL 25 MG PO TABS: 75.0000 mg | ORAL_TABLET | Freq: Every day | ORAL | Status: DC

## 2014-09-02 MED ORDER — FUROSEMIDE 40 MG PO TABS: 40.0000 mg | ORAL_TABLET | Freq: Two times a day (BID) | ORAL | Status: DC

## 2014-09-02 MED ORDER — HEPARIN SOD (PORK) LOCK FLUSH 100 UNIT/ML IV SOLN
250.0000 [IU] | INTRAVENOUS | Status: AC | PRN
  Administered 2014-09-02: 500 [IU]

## 2014-09-02 NOTE — Progress Notes (Signed)
[facility]   [facility] is providing the following services: RW  If patient discharges after hours, please call [phone].   Linward Headland 09/02/2014, 2:16 PM

## 2014-09-02 NOTE — Progress Notes (Signed)
Physical Therapy Treatment Patient Details Name: Martin Morris MRN: [id number] DOB: 04/27/1933 Today's Date: 09/02/2014    History of Present Illness 81 year old male with hx of metastatic lymphoma, L THA, PVD admitted for acute on chronic CHF and NSTEMI.    PT Comments    Assisted pt OOB to amb a great distance in hallway then to and from BR.  Slow but steady gait.  No LOB.  No c/o dizziness.  Spouse reports pt's mobility level is at baseline and she is able to assist him with all needs. Orthostatics: Supine                   120/73(84), HR 60, RA 94% EOB                       108/60(72), HR 62, RA 95% Standing                  84/51(58), HR 61, RA 94%   NO c/o dizziness After amb 15 feet  115/63(75), HR 65, RA 96%    Believe pt to be at or close to prior mobility level of Supervision.   Spouse would like to take pt home sooner then later.    Follow Up Recommendations  Home health PT;Supervision/Assistance - 24 hour     Equipment Recommendations  Rolling walker with 5" wheels    Recommendations for Other Services       Precautions / Restrictions Precautions Precautions: Fall Restrictions Weight Bearing Restrictions: No    Mobility  Bed Mobility Overal bed mobility: Needs Assistance Bed Mobility: Supine to Sit     Supine to sit: Supervision     General bed mobility comments: able to self perform with increased time  Transfers Overall transfer level: Needs assistance Equipment used: None Transfers: Sit to/from Stand Sit to Stand: Supervision         General transfer comment: 25% VC's on proper hand placement esp with stand to sit  Ambulation/Gait Ambulation/Gait assistance: Supervision;Min guard Ambulation Distance (Feet): 275 Feet (one sitting rest break) Assistive device: Rolling walker (2 wheeled) Gait Pattern/deviations: Step-through pattern;Decreased stride length;Trunk flexed Gait velocity: decreased   General Gait Details: slow but steady  gait.  No LOB.  No c/o dizziness.  Pt amb a great distance in hallway then to and from bathroom.  Spouse reports he appears at prior level.    Stairs            Wheelchair Mobility    Modified Rankin (Stroke Patients Only)       Balance                                    Cognition                            Exercises      General Comments        Pertinent Vitals/Pain Pain Assessment: No/denies pain    Home Living                      Prior Function            PT Goals (current goals can now be found in the care plan section) Progress towards PT goals: Progressing toward goals    Frequency  Min 3X/week    PT  Plan      Co-evaluation             End of Session Equipment Utilized During Treatment: Gait belt Activity Tolerance: Patient tolerated treatment well Patient left: in chair;with call bell/phone within reach;with chair alarm set;with family/visitor present     Time: 9678-9381 PT Time Calculation (min) (ACUTE ONLY): 40 min  Charges:  $Gait Training: 23-37 mins $Therapeutic Activity: 8-22 mins                    G Codes:      Rica Koyanagi  PTA [facility]  Acute  Rehab Pager      [phone]

## 2014-09-02 NOTE — Discharge Summary (Signed)
Physician Discharge Summary  ESA RADEN WFU:[id number] DOB: October 14, 1932 DOA: 08/28/2014  PCP: [name], MD  Admit date: 08/28/2014 Discharge date: 09/02/2014  Time spent: 35 minutes  Recommendations for Outpatient Follow-up:  1. Needs close follow-up with Dr. Percival Spanish cardiology as outpatient 2. Consider his dry weight ~ 190 pounds. Was 187 on discharge 3. Continue Lasix twice a day 40 mg 4. Monitor for tachyarrhythmias as an out agent-had some of this during hospital stay 5. Recommend checking basic metabolic panel as well as magnesium in the next week 6. Further management per primary physician as well as oncologist and cardiologist 7. Home health RN with heart failure order set requested and rolling walker 8. Reconsider anticoagulation at discretion of both gastroenterology and cardiology as an outpatient? 8 weeks?   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 coli UTI   Fever   Hypokalemia   Leukocytosis   Anemia   Thrombocytopenia   Elevated troponin   NSTEMI (non-ST elevated myocardial infarction)   Dyspnea   Discharge Condition: fair  Diet recommendation: Heart healthy less than 2 g salt  Filed Weights   08/31/14 0517 09/01/14 0649 09/02/14 0531  Weight: 85.9 kg (189 lb 6 oz) 85.3 kg (188 lb 0.8 oz) 84.9 kg (187 lb 2.7 oz)    History of present illness:  81 y/o ? recent admit 12/8-12/18 with hemorrhagic shock secondary to a UGI bleed [1.5 cm pancake lesion mid stomach body] with complicating acute CVA on MRI 12/14, h/o mantle cell lymphoma, 10/2006-recent head scan 07/27/14 = diffuse progression of disease,  history of anemia of chronic, sleep apnea on CPAP, hyperlipidemia, CK D 2, CAD s/p CABG 2003, last cath = LAD stenosis 90%-medical management-now currently off aspirin/Plavix and was not cardiac catheter candidate secondary to multiple reasons, prior history left femoral neck fracture 11/2013, BMI greater than 30 admitted 08/28/14 acute decompensated hypoxic respiratory failure secondary most likely to acute diastolic heart failure He was diuresed and meds were adjustedby Cardiology  Hospital Course:  1 acute on chronic diastolic CHF exacerbation, Grade 1 diastolic dys, EF 20%, PA peak 44 mmHG [Mod Pulm Htn] Likely secondary to NSTEMI. Cardiac enzymes elevated but are trending back down. Patient denies any chest pain. Patient states shortness of breath improved. I/O = --5.9 L  Patient's weight  is 84.9 down from admit wght 93.4 kg.  Creatinine trending up. Continue imdur, Pravachol.  IV Lasix  to oral Lasix 40 bid 1/5  #2 NSTEMI c complication of NSVT 18 beats 08/31/13 Patient with elevated troponin + decomp acute on chronic diastolic CHF exacerbation. Patient currently denies any chest pain. Due to recent hemorrhagic shock patient's antiplatelet therapy was discontinued. Patient has been seen by cardiology and deemed not a candidate for cath lab with inability to anticoagulate secondary to recent hemorrhagic shock, recent advanced lymphoma and gastric bleed with an elevated creatinine.  Had 18 beats Vtach nonsustained 1/6 Mag level was 0.2 at that time-normalizedc IV mag atenolol increased 50-->75 by cardiology 1/6 Unfortunately complications of Iatrogenic and Orthostatic hypotension on 1/7 c dizzyness  This was thought 2/2 to meds and deconditioning and patient recovered on a lower dose of  Imdur 90 Re-eval for either ASA and Plavix as per discretion of both cardiology and Gi  #3 Escherichia coli pyelonephritis Urine cultures with Escherichia coli sensitivities pending.  Transition IV  Rocephin--->Macrobid twice a day, stop date 09/01/14  #4 hyperlipidemia LDL at goal. Continue Pravachol 80  #5 hypertension Stable. Continue atenolol 75 daily See above  #6 hypokalemia Secondary to diureses. Repleted.  #9 anemia/thrombocytopenia Likely secondary to recent hemorrhagic shock with acute blood loss anemia, malignancy, infection and chemotherapy. No overt signs of bleeding. Follow H&H. Patient has been seen by hematology oncology this admit Hb currently 9-11 range  #10 mantle cell lymphoma Per oncology.  #11 chronic kidney disease stage II Slight bump in creatinine. Monitor closely with diuresis. Baseline GFR 07/27/14 = 88% Currently 50% ACE relative contraindication 2/2 to CKD3-4-may start low dose ACE as OP with careful f/u  #12 recent acute CVA Due to patient's recent hemorrhagic shock aspirin and Plavix were discontinued. Due to recent non-STEMI and no further bleeding since discharge on 08/12/2014.  Aspirin 81 has been resumed.  Cardiology/GIas OP to decide on risks and beneits and when to re-start Dual AP therapy Needs 1 wk f/o reg Cardiologist [place]  #14 prophylaxis PPI for GI prophylaxis. SCDs for DVT prophylaxis.  Procedures:  ECHO  Consultations:  Cardiology  Discharge Exam: Filed Vitals:   09/02/14 1018  BP: 118/57  Pulse: 62  Temp:   Resp:     General: NCAT,EOMI Cardiovascular:  s1s2 no m/r/g Respiratory: CLear nt/nd  Discharge Instructions   Discharge Instructions    Diet - low sodium heart healthy    Complete by:  As directed      Discharge instructions    Complete by:  As directed   You'll need blood work done in about a week at your regular physician's office Some of the dosages of her medications have changed so please look at the list carefully Please weigh yourself daily with the same scale and the same amount of clothes on. If you gained more than 1-2 pounds above 190 pounds, you will need to take an extra dose of  Lasix 40 mg. We will have home health come out and help you with your heart failure and with education about low-salt diet as well as fluid restriction 1500 cc Please report any other issues to your regular physician or your cardiologist     Increase activity slowly    Complete by:  As directed           Current Discharge Medication List    START taking these medications   Details  potassium chloride SA (K-DUR,KLOR-CON) 20 MEQ tablet Take 2 tablets (40 mEq total) by mouth 2 (two) times daily. Qty: 60 tablet, Refills: 0      CONTINUE these medications which have CHANGED   Details  atenolol (TENORMIN) 25 MG tablet Take 3 tablets (75 mg total) by mouth daily. Qty: 90 tablet, Refills: 0    furosemide (LASIX) 40 MG tablet Take 1 tablet (40 mg total) by mouth 2 (two) times daily. Qty: 60 tablet, Refills: 0    isosorbide mononitrate (IMDUR) 30 MG 24 hr tablet Take 3 tablets (90 mg total) by mouth daily. 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 Qty: 100 tablet, Refills: 0    ibrutinib (IMBRUVICA) 140 MG capsul Take 4 capsules (560 mg total) by mouth daily. 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 Qty: 60 Can, Refills: 0      nitroGLYCERIN (NITROSTAT) 0.4 MG SL tablet Place 1 tablet (0.4 mg total) under the tongue every 5 (five) minutes as needed for chest pain. Qty: 25 tablet, Refills: prn    pantoprazole (PROTONIX) 40 MG tablet Take 1 tablet (40 mg total) by mouth 2 (two) times daily. Qty: 60 tablet, Refills: 2    pravastatin (PRAVACHOL) 80 MG tablet Take 1 tablet (80 mg total) by mouth daily. Qty: 30 tablet, Refills: 11    amLODipine (NORVASC) 5 MG tablet Take 1 tablet (5 mg total) by mouth daily. Qty: 30 tablet, Refills: 11    methocarbamol (ROBAXIN) 500 MG tablet Take 1 tablet (500 mg total) by mouth every 6 (six) hours as needed for muscle spasms. Qty: 50 tablet, Refills: 0      STOP taking these medications     lidocaine-prilocaine (EMLA) cream      sulfamethoxazole-trimethoprim (BACTRIM DS,SEPTRA DS) 800-160 MG per tablet        Allergies  Allergen Reactions  . Atorvastatin Other (See Comments)    Incredible dizziness      The results of significant diagnostics from this hospitalization (including imaging, microbiology, ancillary and laboratory) are listed below for reference.    Significant Diagnostic Studies: Dg Chest 2 View  08/28/2014   CLINICAL DATA:  Initial encounter for dyspnea  EXAM: CHEST  2 VIEW  COMPARISON:  08/08/2014  FINDINGS: AP and lateral views of the chest show no focal airspace consolidation. There is some atelectasis the left base. There is pulmonary vascular congestion without overt pulmonary edema. Small bilateral pleural effusions are evident. The cardio pericardial silhouette is enlarged. Patient is status post CABG right Port-A-Cath tip overlies the mid SVC. Telemetry leads overlie the chest.  IMPRESSION: Cardiomegaly with vascular congestion and small bilateral pleural effusions.   Electronically Signed   By: Misty Stanley M.D.   On: 08/28/2014 11:07   Mr Jeri Cos OA Contrast  08/08/2014   CLINICAL DATA:  Mantle cell lymphoma. Evaluate for brain metastases.  EXAM:  MRI HEAD WITHOUT AND WITH CONTRAST  TECHNIQUE: Multiplanar, multiecho pulse sequences of the brain and surrounding structures were obtained without and with intravenous contrast.  CONTRAST:  52m MULTIHANCE GADOBENATE DIMEGLUMINE 529 MG/ML IV SOLN  COMPARISON:  Head CT 08/02/2014  FINDINGS: There are several punctate foci of cortical of restricted diffusion in the right occipital lobe without associated enhancement, consistent with acute infarcts. There is also a subcentimeter focus of acute infarction in the left cerebellum. There is no evidence of intracranial hemorrhage, mass, midline shift, or extra-axial fluid collection. There is moderate ventricular enlargement which is slightly out of proportion to the degree of sulcal enlargement, likely reflecting central predominant cerebral atrophy. Patchy and confluent T2 hyperintensities are present throughout the subcortical and deep  cerebral white matter bilaterally with mild T2 hyperintensities in the brainstem, nonspecific but compatible with moderate chronic small vessel ischemic disease. A  Small, remote cortical/subcortical infarct is noted in the right frontal lobe. Remote subcortical right parietal infarct is noted, and there is also encephalomalacia in the right occipital lobe likely reflecting a chronic infarct. Small, chronic infarcts are also present in the corona radiata bilaterally and right cerebellum. No abnormal brain parenchymal or definite abnormal meningeal enhancement is identified.  There is asymmetric masslike soft tissue thickening in the left posterior nasopharynx measuring approximately 2.0 x 1.3 cm corresponding to abnormal FDG uptake on recent PET-CT. Prior right cataract extraction is noted. There is a small right mastoid effusion. Distal right vertebral artery flow void is not well seen and may be either hypoplastic or occluded. Other major intracranial vascular flow voids are preserved.  IMPRESSION: 1. Punctate, acute posterior  circulation infarcts involving the left cerebellum and right occipital lobe. 2. No evidence of intracranial metastases. 3. Moderate chronic small vessel ischemic disease and cerebral atrophy. Chronic infarcts as above. 4. Asymmetric, nodular soft tissue in the left nasopharynx, concerning for malignancy, specifically involvement by patient's known lymphoma, as described on PET-CT.   Electronically Signed   By: Logan Bores   On: 08/08/2014 15:19   Ir Angiogram Visceral Selective  08/06/2014   INDICATION: Mantle cell lymphoma, with acute GI bleed secondary to gastric involvement, currently controlled with endoscopic clipping. The lesion is along the greater curvature. Prophylactic arterial embolization is requested.  EXAM: 1. ULTRAOUND GUIDANCE FOR ARTERIAL ACCESS 2. CELIAC AND SUPERIOR MESENTERIC ARTERIOGRAM (1st ORDER) 3. LEFT PHRENIC ARTERIOGRAM (1ST ORDER) 4. GASTRODOUDENAL ARTERIOGRAM (3rd ORDER) 5. PERCUTANEOUS COIL EMBOLIZATION RIGHT GASTROEPIPLOIC ARTERY  COMPARISON:  PET-CT 07/27/2014 and earlier studies  MEDICATIONS: Fentanyl 75 mcg IV; Versed 2.0 mg IV  CONTRAST:  183m OMNIPAQUE IOHEXOL 300 MG/ML  SOLN  ANESTHESIA/SEDATION: Total Moderate Sedation Time  89 minutes  FLUOROSCOPY TIME:  29 minutes.  48 seconds.  ACCESS: Right common femoral artery; hemostasis achieved with Exoseal.  COMPLICATIONS: None immediate  TECHNIQUE: Informed written consent was obtained from the patient after a discussion of the risks, benefits and alternatives to treatment. Questions regarding the procedure were encouraged and answered. A timeout was performed prior to the initiation of the procedure.  The right groin was prepped and drapped in the usual sterile fashion, and a sterile drape was applied covering the operative field. Maximum barrier sterile technique with sterile gowns and gloves were used for the procedure. A timeout was performed prior to the initiation of the procedure. Local anesthesia was provided with 1%  lidocaine.  Under ultrasound guidance, the right common femoral artery was accessed with a micropuncture kit after the overlying soft tissues were anesthetized with 1% lidocaine. An ultrasound image was saved for documentation purposes. The micropuncture sheath was exchanged for a 5 [place] sheath over a Bentson wire. At the conclusion of the case, closure arteriogram was performed through the side of the sheath confirming access within the right common femoral artery.  Over a Bentson wire, a C2 catheter was advanced and utilized to select the superior mesenteric artery and a superior mesenteric arteriogram was performed. The C2 catheter was then advanced cranially and utilized to select the celiac artery and a celiac arteriogram was performed.  The C2 was exchanged for a Sos catheter. The the left phrenic artery with was selectively catheterized for arteriography. This demonstrated no significant perfusion to the greater curvature of the stomach.  The  Sos was exchanged for a [employer]. With an angled roadrunner wire, this was advanced into the common hepatic artery. A renegade micro catheter was cul axially advanced into the gastroduodenal artery with the aid of the trans and guidewire. Gastroduodenal arteriography was performed. The micro catheter was exchanged for a longer device, which allowed the distal catheterization of the right gastroepiploic artery at the level of the endoscopic clips. Coil embolization above and below the level of the endoscopic clips was performed using 2 and 3 mm interlock coils. Final arteriogram through the micro catheter in the right gastroepiploic artery was performed. The micro catheter was removed. Follow-up celiac arteriogram was performed.  At this point, the procedure was terminated. All wires and catheters and sheaths were removed from the patient. Hemostasis was achieved at the right groin and access site . A dressing was placed. The patient tolerated procedure well  without immediate postprocedural complication.  FINDINGS: Superior mesenteric arteriogram demonstrates replaced right hepatic arterial artery, an anatomic variant. No significant gastric arterial supply.  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 A large right gastroepiploic artery from the GDA supplies the greater curvature of the stomach to the level of the endoscopic clips.  Left phrenic arteriogram shows no significant gastric arterial supply. If  Percutaneous coil embolization of the distal right gastroepiploic artery along the greater curvature of the stomach at the level of the endoscopic clips was performed.  Completion arteriogram shows technically successful occlusion of the distal right gastroepiploic artery inferior to the level of the endoscopic clips, with the vessel patent more proximally at the level of the gastric antrum. Follow-up celiac arteriogram shows stable arterial anatomy without evident complication.  IMPRESSION: 1. Technically successful coil embolization of the distal right gastroepiploic artery at the level of the endoscopic clips along the greater curvature of the stomach, site of known bleeding lesion.   Electronically Signed   By: Arne Cleveland M.D.   On: 08/06/2014 09:17   Ir Angiogram Visceral Selective  08/06/2014   INDICATION: Mantle cell lymphoma, with acute GI bleed secondary to gastric involvement, currently controlled with endoscopic clipping. The lesion is along the greater curvature. Prophylactic arterial embolization is requested.  EXAM: 1. ULTRAOUND GUIDANCE FOR ARTERIAL ACCESS 2. CELIAC AND SUPERIOR MESENTERIC ARTERIOGRAM (1st ORDER) 3. LEFT PHRENIC ARTERIOGRAM (1ST ORDER) 4. GASTRODOUDENAL ARTERIOGRAM (3rd ORDER) 5. PERCUTANEOUS COIL EMBOLIZATION RIGHT GASTROEPIPLOIC ARTERY  COMPARISON:  PET-CT 07/27/2014 and earlier studies  MEDICATIONS: Fentanyl 75 mcg IV; Versed 2.0 mg IV  CONTRAST:  110m  OMNIPAQUE IOHEXOL 300 MG/ML  SOLN  ANESTHESIA/SEDATION: Total Moderate Sedation Time  89 minutes  FLUOROSCOPY TIME:  29 minutes.  48 seconds.  ACCESS: Right common femoral artery; hemostasis achieved with Exoseal.  COMPLICATIONS: None immediate  TECHNIQUE: Informed written consent was obtained from the patient after a discussion of the risks, benefits and alternatives to treatment. Questions regarding the procedure were encouraged and answered. A timeout was performed prior to the initiation of the procedure.  The right groin was prepped and drapped in the usual sterile fashion, and a sterile drape was applied covering the operative field. Maximum barrier sterile technique with sterile gowns and gloves were used for the procedure. A timeout was performed prior to the initiation of the procedure. Local anesthesia was provided with 1% lidocaine.  Under ultrasound guidance, the right common femoral artery was accessed with a micropuncture kit after the overlying soft tissues were anesthetized with 1% lidocaine. An ultrasound image was saved  for documentation purposes. The micropuncture sheath was exchanged for a 5 [place] vascular sheath over a Bentson wire. At the conclusion of the case, closure arteriogram was performed through the side of the sheath confirming access within the right common femoral artery.  Over a Bentson wire, a C2 catheter was advanced and utilized to select the superior mesenteric artery and a superior mesenteric arteriogram was performed. The C2 catheter was then advanced cranially and utilized to select the celiac artery and a celiac arteriogram was performed.  The C2 was exchanged for a Sos catheter. The the left phrenic artery with was selectively catheterized for arteriography. This demonstrated no significant perfusion to the greater curvature of the stomach.  The Sos was exchanged for a Mickelson. With an angled roadrunner wire, this was advanced into the common hepatic artery. A renegade  micro catheter was cul axially advanced into the gastroduodenal artery with the aid of the trans and guidewire. Gastroduodenal arteriography was performed. The micro catheter was exchanged for a longer device, which allowed the distal catheterization of the right gastroepiploic artery at the level of the endoscopic clips. Coil embolization above and below the level of the endoscopic clips was performed using 2 and 3 mm interlock coils. Final arteriogram through the micro catheter in the right gastroepiploic artery was performed. The micro catheter was removed. Follow-up celiac arteriogram was performed.  At this point, the procedure was terminated. All wires and catheters and sheaths were removed from the patient. Hemostasis was achieved at the right groin and access site . A dressing was placed. The patient tolerated procedure well without immediate postprocedural complication.  FINDINGS: Superior mesenteric arteriogram demonstrates replaced right hepatic arterial artery, an anatomic variant. No significant gastric arterial supply.  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 A large right gastroepiploic artery from the GDA supplies the greater curvature of the stomach to the level of the endoscopic clips.  Left phrenic arteriogram shows no significant gastric arterial supply. If  Percutaneous coil embolization of the distal right gastroepiploic artery along the greater curvature of the stomach at the level of the endoscopic clips was performed.  Completion arteriogram shows technically successful occlusion of the distal right gastroepiploic artery inferior to the level of the endoscopic clips, with the vessel patent more proximally at the level of the gastric antrum. Follow-up celiac arteriogram shows stable arterial anatomy without evident complication.  IMPRESSION: 1. Technically successful coil embolization of the distal right gastroepiploic  artery at the level of the endoscopic clips along the greater curvature of the stomach, site of known bleeding lesion.   Electronically Signed   By: Arne Cleveland M.D.   On: 08/06/2014 09:17   Ir Angiogram Visceral Selective  08/06/2014   INDICATION: Mantle cell lymphoma, with acute GI bleed secondary to gastric involvement, currently controlled with endoscopic clipping. The lesion is along the greater curvature. Prophylactic arterial embolization is requested.  EXAM: 1. ULTRAOUND GUIDANCE FOR ARTERIAL ACCESS 2. CELIAC AND SUPERIOR MESENTERIC ARTERIOGRAM (1st ORDER) 3. LEFT PHRENIC ARTERIOGRAM (1ST ORDER) 4. GASTRODOUDENAL ARTERIOGRAM (3rd ORDER) 5. PERCUTANEOUS COIL EMBOLIZATION RIGHT GASTROEPIPLOIC ARTERY  COMPARISON:  PET-CT 07/27/2014 and earlier studies  MEDICATIONS: Fentanyl 75 mcg IV; Versed 2.0 mg IV  CONTRAST:  126m OMNIPAQUE IOHEXOL 300 MG/ML  SOLN  ANESTHESIA/SEDATION: Total Moderate Sedation Time  89 minutes  FLUOROSCOPY TIME:  29 minutes.  48 seconds.  ACCESS: Right common femoral artery; hemostasis achieved with Exoseal.  COMPLICATIONS: None immediate  TECHNIQUE: Informed written  consent was obtained from the patient after a discussion of the risks, benefits and alternatives to treatment. Questions regarding the procedure were encouraged and answered. A timeout was performed prior to the initiation of the procedure.  The right groin was prepped and drapped in the usual sterile fashion, and a sterile drape was applied covering the operative field. Maximum barrier sterile technique with sterile gowns and gloves were used for the procedure. A timeout was performed prior to the initiation of the procedure. Local anesthesia was provided with 1% lidocaine.  Under ultrasound guidance, the right common femoral artery was accessed with a micropuncture kit after the overlying soft tissues were anesthetized with 1% lidocaine. An ultrasound image was saved for documentation purposes. The micropuncture  sheath was exchanged for a 5 [place] vascular sheath over a Bentson wire. At the conclusion of the case, closure arteriogram was performed through the side of the sheath confirming access within the right common femoral artery.  Over a Bentson wire, a C2 catheter was advanced and utilized to select the superior mesenteric artery and a superior mesenteric arteriogram was performed. The C2 catheter was then advanced cranially and utilized to select the celiac artery and a celiac arteriogram was performed.  The C2 was exchanged for a Sos catheter. The the left phrenic artery with was selectively catheterized for arteriography. This demonstrated no significant perfusion to the greater curvature of the stomach.  The Sos was exchanged for a Mickelson. With an angled roadrunner wire, this was advanced into the common hepatic artery. A renegade micro catheter was cul axially advanced into the gastroduodenal artery with the aid of the trans and guidewire. Gastroduodenal arteriography was performed. The micro catheter was exchanged for a longer device, which allowed the distal catheterization of the right gastroepiploic artery at the level of the endoscopic clips. Coil embolization above and below the level of the endoscopic clips was performed using 2 and 3 mm interlock coils. Final arteriogram through the micro catheter in the right gastroepiploic artery was performed. The micro catheter was removed. Follow-up celiac arteriogram was performed.  At this point, the procedure was terminated. All wires and catheters and sheaths were removed from the patient. Hemostasis was achieved at the right groin and access site . A dressing was placed. The patient tolerated procedure well without immediate postprocedural complication.  FINDINGS: Superior mesenteric arteriogram demonstrates replaced right hepatic arterial artery, an anatomic variant. No significant gastric arterial supply.  A celiac arteriogram demonstrates classic  trifurcation into left gastric, splenic, and common hepatic arteries. A large right gastroepiploic artery from the GDA supplies the greater curvature of the stomach to the level of the endoscopic clips.  Left phrenic arteriogram shows no significant gastric arterial supply. If  Percutaneous coil embolization of the distal right gastroepiploic artery along the greater curvature of the stomach at the level of the endoscopic clips was performed.  Completion arteriogram shows technically successful occlusion of the distal right gastroepiploic artery inferior to the level of the endoscopic clips, with the vessel patent more proximally at the level of the gastric antrum. Follow-up celiac arteriogram shows stable arterial anatomy without evident complication.  IMPRESSION: 1. Technically successful coil embolization of the distal right gastroepiploic artery at the level of the endoscopic clips along the greater curvature of the stomach, site of known bleeding lesion.   Electronically Signed   By: Arne Cleveland M.D.   On: 08/06/2014 09:17   Ir Angiogram Selective Each Additional Vessel  08/06/2014   INDICATION: Mantle cell lymphoma,  with acute GI bleed secondary to gastric involvement, currently controlled with endoscopic clipping. The lesion is along the greater curvature. Prophylactic arterial embolization is requested.  EXAM: 1. ULTRAOUND GUIDANCE FOR ARTERIAL ACCESS 2. CELIAC AND SUPERIOR MESENTERIC ARTERIOGRAM (1st ORDER) 3. LEFT PHRENIC ARTERIOGRAM (1ST ORDER) 4. GASTRODOUDENAL ARTERIOGRAM (3rd ORDER) 5. PERCUTANEOUS COIL EMBOLIZATION RIGHT GASTROEPIPLOIC ARTERY  COMPARISON:  PET-CT 07/27/2014 and earlier studies  MEDICATIONS: Fentanyl 75 mcg IV; Versed 2.0 mg IV  CONTRAST:  14m OMNIPAQUE IOHEXOL 300 MG/ML  SOLN  ANESTHESIA/SEDATION: Total Moderate Sedation Time  89 minutes  FLUOROSCOPY TIME:  29 minutes.  48 seconds.  ACCESS: Right common femoral artery; hemostasis achieved with Exoseal.  COMPLICATIONS: None  immediate  TECHNIQUE: Informed written consent was obtained from the patient after a discussion of the risks, benefits and alternatives to treatment. Questions regarding the procedure were encouraged and answered. A timeout was performed prior to the initiation of the procedure.  The right groin was prepped and drapped in the usual sterile fashion, and a sterile drape was applied covering the operative field. Maximum barrier sterile technique with sterile gowns and gloves were used for the procedure. A timeout was performed prior to the initiation of the procedure. Local anesthesia was provided with 1% lidocaine.  Under ultrasound guidance, the right common femoral artery was accessed with a micropuncture kit after the overlying soft tissues were anesthetized with 1% lidocaine. An ultrasound image was saved for documentation purposes. The micropuncture sheath was exchanged for a 5 [place] sheath over a Bentson wire. At the conclusion of the case, closure arteriogram was performed through the side of the sheath confirming access within the right common femoral artery.  Over a Bentson wire, a C2 catheter was advanced and utilized to select the superior mesenteric artery and a superior mesenteric arteriogram was performed. The C2 catheter was then advanced cranially and utilized to select the celiac artery and a celiac arteriogram was performed.  The C2 was exchanged for a Sos catheter. The the left phrenic artery with was selectively catheterized for arteriography. This demonstrated no significant perfusion to the greater curvature of the stomach.  The Sos was exchanged for a Mickelson. With an angled roadrunner wire, this was advanced into the common hepatic artery. A renegade micro catheter was cul axially advanced into the gastroduodenal artery with the aid of the trans and guidewire. Gastroduodenal arteriography was performed. The micro catheter was exchanged for a longer device, which allowed the distal  catheterization of the right gastroepiploic artery at the level of the endoscopic clips. Coil embolization above and below the level of the endoscopic clips was performed using 2 and 3 mm interlock coils. Final arteriogram through the micro catheter in the right gastroepiploic artery was performed. The micro catheter was removed. Follow-up celiac arteriogram was performed.  At this point, the procedure was terminated. All wires and catheters and sheaths were removed from the patient. Hemostasis was achieved at the right groin and access site . A dressing was placed. The patient tolerated procedure well without immediate postprocedural complication.  FINDINGS: Superior mesenteric arteriogram demonstrates replaced right hepatic arterial artery, an anatomic variant. No significant gastric arterial supply.  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 A large right gastroepiploic artery from the GDA supplies the greater curvature of the stomach to the level of the endoscopic clips.  Left phrenic arteriogram shows no significant gastric arterial supply. If  Percutaneous coil embolization of the distal right gastroepiploic artery along  the greater curvature of the stomach at the level of the endoscopic clips was performed.  Completion arteriogram shows technically successful occlusion of the distal right gastroepiploic artery inferior to the level of the endoscopic clips, with the vessel patent more proximally at the level of the gastric antrum. Follow-up celiac arteriogram shows stable arterial anatomy without evident complication.  IMPRESSION: 1. Technically successful coil embolization of the distal right gastroepiploic artery at the level of the endoscopic clips along the greater curvature of the stomach, site of known bleeding lesion.   Electronically Signed   By: Arne Cleveland M.D.   On: 08/06/2014 09:17   Ir Angiogram Selective Each Additional  Vessel  08/06/2014   INDICATION: Mantle cell lymphoma, with acute GI bleed secondary to gastric involvement, currently controlled with endoscopic clipping. The lesion is along the greater curvature. Prophylactic arterial embolization is requested.  EXAM: 1. ULTRAOUND GUIDANCE FOR ARTERIAL ACCESS 2. CELIAC AND SUPERIOR MESENTERIC ARTERIOGRAM (1st ORDER) 3. LEFT PHRENIC ARTERIOGRAM (1ST ORDER) 4. GASTRODOUDENAL ARTERIOGRAM (3rd ORDER) 5. PERCUTANEOUS COIL EMBOLIZATION RIGHT GASTROEPIPLOIC ARTERY  COMPARISON:  PET-CT 07/27/2014 and earlier studies  MEDICATIONS: Fentanyl 75 mcg IV; Versed 2.0 mg IV  CONTRAST:  146m OMNIPAQUE IOHEXOL 300 MG/ML  SOLN  ANESTHESIA/SEDATION: Total Moderate Sedation Time  89 minutes  FLUOROSCOPY TIME:  29 minutes.  48 seconds.  ACCESS: Right common femoral artery; hemostasis achieved with Exoseal.  COMPLICATIONS: None immediate  TECHNIQUE: Informed written consent was obtained from the patient after a discussion of the risks, benefits and alternatives to treatment. Questions regarding the procedure were encouraged and answered. A timeout was performed prior to the initiation of the procedure.  The right groin was prepped and drapped in the usual sterile fashion, and a sterile drape was applied covering the operative field. Maximum barrier sterile technique with sterile gowns and gloves were used for the procedure. A timeout was performed prior to the initiation of the procedure. Local anesthesia was provided with 1% lidocaine.  Under ultrasound guidance, the right common femoral artery was accessed with a micropuncture kit after the overlying soft tissues were anesthetized with 1% lidocaine. An ultrasound image was saved for documentation purposes. The micropuncture sheath was exchanged for a 5 [place] sheath over a Bentson wire. At the conclusion of the case, closure arteriogram was performed through the side of the sheath confirming access within the right common femoral artery.   Over a Bentson wire, a C2 catheter was advanced and utilized to select the superior mesenteric artery and a superior mesenteric arteriogram was performed. The C2 catheter was then advanced cranially and utilized to select the celiac artery and a celiac arteriogram was performed.  The C2 was exchanged for a Sos catheter. The the left phrenic artery with was selectively catheterized for arteriography. This demonstrated no significant perfusion to the greater curvature of the stomach.  The Sos was exchanged for a Mickelson. With an angled roadrunner wire, this was advanced into the common hepatic artery. A renegade micro catheter was cul axially advanced into the gastroduodenal artery with the aid of the trans and guidewire. Gastroduodenal arteriography was performed. The micro catheter was exchanged for a longer device, which allowed the distal catheterization of the right gastroepiploic artery at the level of the endoscopic clips. Coil embolization above and below the level of the endoscopic clips was performed using 2 and 3 mm interlock coils. Final arteriogram through the micro catheter in the right gastroepiploic artery was performed. The micro catheter was removed. Follow-up celiac arteriogram  was performed.  At this point, the procedure was terminated. All wires and catheters and sheaths were removed from the patient. Hemostasis was achieved at the right groin and access site . A dressing was placed. The patient tolerated procedure well without immediate postprocedural complication.  FINDINGS: Superior mesenteric arteriogram demonstrates replaced right hepatic arterial artery, an anatomic variant. No significant gastric arterial supply.  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 A large right gastroepiploic artery from the GDA supplies the greater curvature of the stomach to the level of the endoscopic clips.  Left phrenic arteriogram shows no  significant gastric arterial supply. If  Percutaneous coil embolization of the distal right gastroepiploic artery along the greater curvature of the stomach at the level of the endoscopic clips was performed.  Completion arteriogram shows technically successful occlusion of the distal right gastroepiploic artery inferior to the level of the endoscopic clips, with the vessel patent more proximally at the level of the gastric antrum. Follow-up celiac arteriogram shows stable arterial anatomy without evident complication.  IMPRESSION: 1. Technically successful coil embolization of the distal right gastroepiploic artery at the level of the endoscopic clips along the greater curvature of the stomach, site of known bleeding lesion.   Electronically Signed   By: Arne Cleveland M.D.   On: 08/06/2014 09:17   Ir US Guide Vasc Access Right  08/06/2014   INDICATION: Mantle cell lymphoma, with acute GI bleed secondary to gastric involvement, currently controlled with endoscopic clipping. The lesion is along the greater curvature. Prophylactic arterial embolization is requested.  EXAM: 1. ULTRAOUND GUIDANCE FOR ARTERIAL ACCESS 2. CELIAC AND SUPERIOR MESENTERIC ARTERIOGRAM (1st ORDER) 3. LEFT PHRENIC ARTERIOGRAM (1ST ORDER) 4. GASTRODOUDENAL ARTERIOGRAM (3rd ORDER) 5. PERCUTANEOUS COIL EMBOLIZATION RIGHT GASTROEPIPLOIC ARTERY  COMPARISON:  PET-CT 07/27/2014 and earlier studies  MEDICATIONS: Fentanyl 75 mcg IV; Versed 2.0 mg IV  CONTRAST:  164m OMNIPAQUE IOHEXOL 300 MG/ML  SOLN  ANESTHESIA/SEDATION: Total Moderate Sedation Time  89 minutes  FLUOROSCOPY TIME:  29 minutes.  48 seconds.  ACCESS: Right common femoral artery; hemostasis achieved with Exoseal.  COMPLICATIONS: None immediate  TECHNIQUE: Informed written consent was obtained from the patient after a discussion of the risks, benefits and alternatives to treatment. Questions regarding the procedure were encouraged and answered. A timeout was performed prior to the  initiation of the procedure.  The right groin was prepped and drapped in the usual sterile fashion, and a sterile drape was applied covering the operative field. Maximum barrier sterile technique with sterile gowns and gloves were used for the procedure. A timeout was performed prior to the initiation of the procedure. Local anesthesia was provided with 1% lidocaine.  Under ultrasound guidance, the right common femoral artery was accessed with a micropuncture kit after the overlying soft tissues were anesthetized with 1% lidocaine. An ultrasound image was saved for documentation purposes. The micropuncture sheath was exchanged for a 5 [place] sheath over a Bentson wire. At the conclusion of the case, closure arteriogram was performed through the side of the sheath confirming access within the right common femoral artery.  Over a Bentson wire, a C2 catheter was advanced and utilized to select the superior mesenteric artery and a superior mesenteric arteriogram was performed. The C2 catheter was then advanced cranially and utilized to select the celiac artery and a celiac arteriogram was performed.  The C2 was exchanged for a Sos catheter. The the left phrenic artery with was selectively catheterized for arteriography. This  demonstrated no significant perfusion to the greater curvature of the stomach.  The Sos was exchanged for a Mickelson. With an angled roadrunner wire, this was advanced into the common hepatic artery. A renegade micro catheter was cul axially advanced into the gastroduodenal artery with the aid of the trans and guidewire. Gastroduodenal arteriography was performed. The micro catheter was exchanged for a longer device, which allowed the distal catheterization of the right gastroepiploic artery at the level of the endoscopic clips. Coil embolization above and below the level of the endoscopic clips was performed using 2 and 3 mm interlock coils. Final arteriogram through the micro catheter in  the right gastroepiploic artery was performed. The micro catheter was removed. Follow-up celiac arteriogram was performed.  At this point, the procedure was terminated. All wires and catheters and sheaths were removed from the patient. Hemostasis was achieved at the right groin and access site . A dressing was placed. The patient tolerated procedure well without immediate postprocedural complication.  FINDINGS: Superior mesenteric arteriogram demonstrates replaced right hepatic arterial artery, an anatomic variant. No significant gastric arterial supply.  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 A large right gastroepiploic artery from the GDA supplies the greater curvature of the stomach to the level of the endoscopic clips.  Left phrenic arteriogram shows no significant gastric arterial supply. If  Percutaneous coil embolization of the distal right gastroepiploic artery along the greater curvature of the stomach at the level of the endoscopic clips was performed.  Completion arteriogram shows technically successful occlusion of the distal right gastroepiploic artery inferior to the level of the endoscopic clips, with the vessel patent more proximally at the level of the gastric antrum. Follow-up celiac arteriogram shows stable arterial anatomy without evident complication.  IMPRESSION: 1. Technically successful coil embolization of the distal right gastroepiploic artery at the level of the endoscopic clips along the greater curvature of the stomach, site of known bleeding lesion.   Electronically Signed   By: Arne Cleveland M.D.   On: 08/06/2014 09:17   Dg Chest Port 1 View  08/29/2014   CLINICAL DATA:  81 year old male with shortness of breath. History of hypertension, coronary artery disease, and lymphoma. Subsequent encounter.  EXAM: PORTABLE CHEST - 1 VIEW  COMPARISON:  08/28/2014 chest x-ray.  07/27/2014 PET-CT.  FINDINGS: Right-sided  MediPort catheter tip proximal superior vena cava level.  Post CABG.  Cardiomegaly.  Mild pulmonary vascular prominence.  No gross pneumothorax.  PET CT detected pulmonary parenchymal changes which demonstrated FDG uptake not as well delineated on the present plain film examination. Left base parenchymal changes stable.  Calcified tortuous aorta.  Coronary artery calcifications.  IMPRESSION: Post CABG.  Cardiomegaly.  Mild pulmonary vascular prominence.  PET CT detected pulmonary parenchymal changes which demonstrated FDG uptake not as well delineated on the present plain film examination. Left base parenchymal changes appear stable.   Electronically Signed   By: Chauncey Cruel M.D.   On: 08/29/2014 07:33   Dg Chest Port 1 View  08/08/2014   CLINICAL DATA:  Increased shortness of breath  EXAM: PORTABLE CHEST - 1 VIEW  COMPARISON:  08/07/2013  FINDINGS: Right IJ porta catheter remains in good position, tip at the SVC.  Unchanged cardiomegaly and aortic contours. CABG changes again noted. Near complete clearance of pulmonary edema, with mild perihilar congestion still present. No pleural effusion or pneumothorax.  IMPRESSION: Clearing pulmonary edema.   Electronically Signed   By: Gilford Silvius.D.  On: 08/08/2014 05:16   Dg Chest Port 1 View  08/07/2014   CLINICAL DATA:  Initial evaluation for fever  EXAM: PORTABLE CHEST - 1 VIEW  COMPARISON:  08/02/2014  FINDINGS: Stable mild cardiac enlargement. Moderate vascular congestion. Mild diffuse interstitial prominence.  Stable Port-A-Cath on the right. Status post removal of endotracheal tube and enteric tube. Mild bibasilar atelectasis. No significant pleural effusion.  IMPRESSION: Findings suggest congestive heart failure with mild interstitial pulmonary edema   Electronically Signed   By: Skipper Cliche M.D.   On: 08/07/2014 18:44   Ir Embo Arterial Not [place]  08/06/2014   INDICATION: Mantle cell lymphoma, with acute GI  bleed secondary to gastric involvement, currently controlled with endoscopic clipping. The lesion is along the greater curvature. Prophylactic arterial embolization is requested.  EXAM: 1. ULTRAOUND GUIDANCE FOR ARTERIAL ACCESS 2. CELIAC AND SUPERIOR MESENTERIC ARTERIOGRAM (1st ORDER) 3. LEFT PHRENIC ARTERIOGRAM (1ST ORDER) 4. GASTRODOUDENAL ARTERIOGRAM (3rd ORDER) 5. PERCUTANEOUS COIL EMBOLIZATION RIGHT GASTROEPIPLOIC ARTERY  COMPARISON:  PET-CT 07/27/2014 and earlier studies  MEDICATIONS: Fentanyl 75 mcg IV; Versed 2.0 mg IV  CONTRAST:  158m OMNIPAQUE IOHEXOL 300 MG/ML  SOLN  ANESTHESIA/SEDATION: Total Moderate Sedation Time  89 minutes  FLUOROSCOPY TIME:  29 minutes.  48 seconds.  ACCESS: Right common femoral artery; hemostasis achieved with Exoseal.  COMPLICATIONS: None immediate  TECHNIQUE: Informed written consent was obtained from the patient after a discussion of the risks, benefits and alternatives to treatment. Questions regarding the procedure were encouraged and answered. A timeout was performed prior to the initiation of the procedure.  The right groin was prepped and drapped in the usual sterile fashion, and a sterile drape was applied covering the operative field. Maximum barrier sterile technique with sterile gowns and gloves were used for the procedure. A timeout was performed prior to the initiation of the procedure. Local anesthesia was provided with 1% lidocaine.  Under ultrasound guidance, the right common femoral artery was accessed with a micropuncture kit after the overlying soft tissues were anesthetized with 1% lidocaine. An ultrasound image was saved for documentation purposes. The micropuncture sheath was exchanged for a 5 [place] sheath over a Bentson wire. At the conclusion of the case, closure arteriogram was performed through the side of the sheath confirming access within the right common femoral artery.  Over a Bentson wire, a C2 catheter was advanced and utilized to select  the superior mesenteric artery and a superior mesenteric arteriogram was performed. The C2 catheter was then advanced cranially and utilized to select the celiac artery and a celiac arteriogram was performed.  The C2 was exchanged for a Sos catheter. The the left phrenic artery with was selectively catheterized for arteriography. This demonstrated no significant perfusion to the greater curvature of the stomach.  The Sos was exchanged for a Mickelson. With an angled roadrunner wire, this was advanced into the common hepatic artery. A renegade micro catheter was cul axially advanced into the gastroduodenal artery with the aid of the trans and guidewire. Gastroduodenal arteriography was performed. The micro catheter was exchanged for a longer device, which allowed the distal catheterization of the right gastroepiploic artery at the level of the endoscopic clips. Coil embolization above and below the level of the endoscopic clips was performed using 2 and 3 mm interlock coils. Final arteriogram through the micro catheter in the right gastroepiploic artery was performed. The micro catheter was removed. Follow-up celiac arteriogram was performed.  At this point, the procedure was  terminated. All wires and catheters and sheaths were removed from the patient. Hemostasis was achieved at the right groin and access site . A dressing was placed. The patient tolerated procedure well without immediate postprocedural complication.  FINDINGS: Superior mesenteric arteriogram demonstrates replaced right hepatic arterial artery, an anatomic variant. No significant gastric arterial supply.  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 A large right gastroepiploic artery from the GDA supplies the greater curvature of the stomach to the level of the endoscopic clips.  Left phrenic arteriogram shows no significant gastric arterial supply. If  Percutaneous coil  embolization of the distal right gastroepiploic artery along the greater curvature of the stomach at the level of the endoscopic clips was performed.  Completion arteriogram shows technically successful occlusion of the distal right gastroepiploic artery inferior to the level of the endoscopic clips, with the vessel patent more proximally at the level of the gastric antrum. Follow-up celiac arteriogram shows stable arterial anatomy without evident complication.  IMPRESSION: 1. Technically successful coil embolization of the distal right gastroepiploic artery at the level of the endoscopic clips along the greater curvature of the stomach, site of known bleeding lesion.   Electronically Signed   By: Arne Cleveland M.D.   On: 08/06/2014 09:17    Microbiology: Recent Results (from the past 240 hour(s))  Culture, blood (routine x 2)     Status: None (Preliminary result)   Collection Time: 08/28/14 11:28 AM  Result Value Ref Range Status   Specimen Description BLOOD LEFT ANTECUBITAL  Final   Special Requests BOTTLES DRAWN AEROBIC ONLY 3 CC   Final   Culture   Final           BLOOD CULTURE RECEIVED NO GROWTH TO DATE CULTURE WILL BE HELD FOR 5 DAYS BEFORE ISSUING A FINAL NEGATIVE REPORT Note: Culture results may be compromised due to an inadequate volume of blood received in culture bottles. Performed at [facility]    Report Status PENDING  Incomplete  Culture, blood (routine x 2)     Status: None (Preliminary result)   Collection Time: 08/28/14 11:29 AM  Result Value Ref Range Status   Specimen Description BLOOD LEFT THUMB  Final   Special Requests BOTTLES DRAWN AEROBIC ONLY 4 CC   Final   Culture   Final           BLOOD CULTURE RECEIVED NO GROWTH TO DATE CULTURE WILL BE HELD FOR 5 DAYS BEFORE ISSUING A FINAL NEGATIVE REPORT Performed at [facility]    Report Status PENDING  Incomplete  Urine culture     Status: None   Collection Time: 08/28/14  1:05 PM  Result Value Ref  Range Status   Specimen Description URINE, CLEAN CATCH  Final   Special Requests NONE  Final   Colony Count   Final    >=100,000 COLONIES/ML Performed at [facility]    Culture   Final    ESCHERICHIA COLI Performed at [facility]    Report Status 08/30/2014 FINAL  Final   Organism ID, Bacteria ESCHERICHIA COLI  Final      Susceptibility   Escherichia coli - MIC*    AMPICILLIN >=32 RESISTANT Resistant     CEFAZOLIN <=4 SENSITIVE Sensitive     CEFTRIAXONE <=1 SENSITIVE Sensitive     CIPROFLOXACIN 1 SENSITIVE Sensitive     GENTAMICIN >=16 RESISTANT Resistant     LEVOFLOXACIN 1 SENSITIVE Sensitive  NITROFURANTOIN <=16 SENSITIVE Sensitive     TOBRAMYCIN 8 INTERMEDIATE Intermediate     TRIMETH/SULFA >=320 RESISTANT Resistant     PIP/TAZO <=4 SENSITIVE Sensitive     * ESCHERICHIA COLI  MRSA PCR Screening     Status: None   Collection Time: 08/28/14  5:40 PM  Result Value Ref Range Status   MRSA by PCR NEGATIVE NEGATIVE Final    Comment:        The GeneXpert MRSA Assay (FDA approved for NASAL specimens only), is one component of a comprehensive MRSA colonization surveillance program. It is not intended to diagnose MRSA infection nor to guide or monitor treatment for MRSA infections. Performed at [facility]: Basic Metabolic Panel:  Recent Labs Lab 08/28/14 1128 08/29/14 0525 08/30/14 0445 08/31/14 0440 08/31/14 2123 09/02/14 0435  NA 138 140 139 143  --  141  K 2.8* 4.4 3.7 3.8  --  3.3*  CL 105 104 100 98  --  96  CO2 27 28 30  33*  --  35*  GLUCOSE 123* 100* 97 102*  --  119*  BUN 17 24* 28* 25*  --  27*  CREATININE 1.40* 1.64* 1.74* 1.49*  --  1.58*  CALCIUM 8.2* 7.8* 8.0* 8.9  --  8.7  MG 1.6  --   --  0.2* 2.3 1.9   Liver Function Tests:  Recent Labs Lab 08/28/14 1128 09/02/14 0435  AST 22 24  ALT 9 14  ALKPHOS 108 101  BILITOT 1.1 0.7  PROT 5.8* 5.9*  ALBUMIN 3.4* 3.1*   No results for input(s): LIPASE,  AMYLASE in the last 168 hours. No results for input(s): AMMONIA in the last 168 hours. CBC:  Recent Labs Lab 08/29/14 1015 08/30/14 0445 08/31/14 0440 09/01/14 0521 09/02/14 0435  WBC 11.6* 10.4 9.3 6.7 7.3  NEUTROABS 9.5* 7.6 6.0 4.0 4.2  HGB 9.0* 9.2* 11.2* 10.5* 10.5*  HCT 28.8* 29.4* 36.2* 33.9* 33.1*  MCV 97.3 96.7 96.0 95.5 94.3  PLT 140* 154 191 218 197   Cardiac Enzymes:  Recent Labs Lab 08/28/14 1826 08/28/14 2330 08/29/14 0525  TROPONINI 2.04* 1.45* 1.04*   BNP: BNP (last 3 results)  Recent Labs  12/09/13 0130  PROBNP 630.1*   CBG: No results for input(s): GLUCAP in the last 168 hours.     SignedNita Sells  [facility] 09/02/2014, 1:52 PM

## 2014-09-02 NOTE — Progress Notes (Signed)
Subjective:  No chest pain or SOB  Objective:   Vital Signs in the last 24 hours: Temp:  [97.4 F (36.3 C)-98.3 F (36.8 C)] 97.4 F (36.3 C) (01/08 0531) Pulse Rate:  [57-59] 57 (01/08 0531) Resp:  [16-20] 20 (01/08 0531) BP: (108-124)/(63-69) 124/69 mmHg (01/08 0531) SpO2:  [93 %-97 %] 95 % (01/08 0531) Weight:  [187 lb 2.7 oz (84.9 kg)] 187 lb 2.7 oz (84.9 kg) (01/08 0531)   Weight 205 ==> 187  Intake/Output from previous day: 01/07 0701 - 01/08 0700 In: 1070 [P.O.:940; I.V.:130] Out: 1000 [Urine:1000] Net: +70  I/O since admission: -5415  Medications: . acyclovir  400 mg Oral Daily  . antiseptic oral rinse  7 mL Mouth Rinse BID  . aspirin EC  81 mg Oral Daily  . atenolol  75 mg Oral Daily  . citalopram  20 mg Oral Daily  . furosemide  40 mg Oral BID  . isosorbide mononitrate  90 mg Oral Daily  . pantoprazole  40 mg Oral BID  . potassium chloride  40 mEq Oral BID  . pravastatin  40 mg Oral Q2200  . sodium chloride  3 mL Intravenous Q12H       Physical Exam:   General appearance: alert, cooperative and no distress Neck: no JVD, supple, symmetrical, trachea midline and thyroid not enlarged, symmetric, no tenderness/mass/nodules Lungs: no wheezing,  Decreased BS at bases Heart: regular rate and rhythm 1-2/sem; no s3. No diastolic murmur Abdomen: soft, non-tender; bowel sounds normal; no masses,  no organomegaly Extremities: resolution of prior edema Neurologic: Grossly normal   Rate: 58-62  Rhythm: normal sinus rhythm with PAC's  08/28/14 ECG (independently read by me): NSR at 82 with NSSTT changes, PAC  08/31/14/ ECG ( independently read by me): NSR at 62; NSSTT changes  Lab Results:  BMP Latest Ref Rng 09/02/2014 08/31/2014 08/30/2014  Glucose 70 - 99 mg/dL 119(H) 102(H) 97  BUN 6 - 23 mg/dL 27(H) 25(H) 28(H)  Creatinine 0.50 - 1.35 mg/dL 1.58(H) 1.49(H) 1.74(H)  Sodium 135 - 145 mmol/L 141 143 139  Potassium 3.5 - 5.1 mmol/L 3.3(L) 3.8 3.7  Chloride 96  - 112 mEq/L 96 98 100  CO2 19 - 32 mmol/L 35(H) 33(H) 30  Calcium 8.4 - 10.5 mg/dL 8.7 8.9 8.0(L)     CBC Latest Ref Rng 09/02/2014 09/01/2014 08/31/2014  WBC 4.0 - 10.5 K/uL 7.3 6.7 9.3  Hemoglobin 13.0 - 17.0 g/dL 10.5(L) 10.5(L) 11.2(L)  Hematocrit 39.0 - 52.0 % 33.1(L) 33.9(L) 36.2(L)  Platelets 150 - 400 K/uL 197 218 191     No results for input(s): TROPONINI in the last 72 hours.  Invalid input(s): CK, MB  Hepatic Function Panel  Recent Labs  09/02/14 0435  PROT 5.9*  ALBUMIN 3.1*  AST 24  ALT 14  ALKPHOS 101  BILITOT 0.7   No results for input(s): INR in the last 72 hours. BNP (last 3 results)  Recent Labs  12/09/13 0130  PROBNP 630.1*   BNP (not Pro-BNP) 394  Lipid Panel     Component Value Date/Time   CHOL 85 08/29/2014 0525   TRIG 125 08/29/2014 0525   HDL 26* 08/29/2014 0525   CHOLHDL 3.3 08/29/2014 0525   VLDL 25 08/29/2014 0525   LDLCALC 34 08/29/2014 0525      Imaging:  No results found.    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 coli UTI   Fever   Hypokalemia   Leukocytosis   Anemia   Thrombocytopenia   Elevated troponin   NSTEMI (non-ST elevated myocardial infarction)   Dyspnea  1. CAD: s/p CABG with occ SVG to dx; occ vein to OM and PDA. No angina on current therapy.  2. NSTEMI; continue nitrates/BB;  HR currently 58 - 60; continue BB at present dose but if HR decreases consistently below 54 can decrease dose if needed. No AC with recent GI bleed, on ASA 4. NSVT;  Asymptomatic 16 beats noted on telemetry on 1/6; cycle length ~480 msec; atenolol dose was to 75 mg 3. Renal insufficiency:   Cr improved yesterday from 1.74 to 1.49; today 1.58.  c/w stage 3 CKD;  Now on oral lasix. Labs pending from today.   4. Anemia; HCT 29.4 => 36.2 5. Acute on chronic CHF combined with BNP 458 improved to 394 6. HLD;  With LDL 34 and total Chol 85 on pravastatin 80 mg dose decreased to 40 mg. 7. HypoK  Today low at 3.3; replete to >4.0  BP stable today. Monitor orthostasis. Cardiology F/U with Dr. Percival Spanish post dc.    Troy Sine, MD, [facility] 09/02/2014, 7:45 AM

## 2014-09-03 LAB — CULTURE, BLOOD (ROUTINE X 2)
Culture: NO GROWTH
Culture: NO GROWTH

## 2014-09-06 ENCOUNTER — Telehealth: Payer: Self-pay | Admitting: *Deleted

## 2014-09-06 NOTE — Telephone Encounter (Signed)
Start NOW

## 2014-09-06 NOTE — Telephone Encounter (Signed)
Wife left VM this morning, states pt was d/c'd home from hosptial on Friday.  They have picked up Ibrutinib from pharmacy.  She asks when pt should start taking it and when he needs to f/u w/ Dr. Alvy Bimler?

## 2014-09-06 NOTE — Telephone Encounter (Signed)
Instructed wife to have pt start taking Ibrutinib today. She read bottle and dose 140 mg capsule.  Reviewed instructions to take 4 capsules daily w/ glass of water.  Please call if any side effects or any concerns.  Keep appt for lab and Dr. Alvy Bimler on 1/26 as scheduled.  She verbalized understanding.

## 2014-09-08 ENCOUNTER — Telehealth: Payer: Self-pay | Admitting: *Deleted

## 2014-09-08 ENCOUNTER — Telehealth: Payer: Self-pay | Admitting: Cardiology

## 2014-09-08 NOTE — Telephone Encounter (Signed)
Returned call to [place] with [place] she stated she went out to draw blood today.Stated she wanted Dr.Hochrein to know unable to obtain blood through patient's port.Stated pt has bad veins.Small amount was obtained but blood was hemolyzed,potassium was 6.1.Stated patient has a appointment with Dr.Hochrein tomorrow 09/09/14 at 10:45 am.Stated Dr.Hochrein will need to have potassium repeated.Stated she is also concerned she has orders to do weekly lab and she may not be able to get blood.Message sent to [place].

## 2014-09-08 NOTE — Telephone Encounter (Signed)
Nurse from [facility] called to say she drew labs peripherally from [facility]- was a very slow draw and labs hemolyzed. K level is elevated (results being faxed to [place]). They feel results are related to hemolysis. Has appt with cardiologist tomorrow- labs faxed there as well.

## 2014-09-08 NOTE — Telephone Encounter (Signed)
[facility] called in wanted to let Dr. Percival Spanish know that there was some struggle with drawing the pt's labs for his BMP and Magnesium. She states that the blood hemolyzed and it will be difficult to continue to drawn labs every week due to the pt having poor veins. Please call if need further clarification  Thanks

## 2014-09-09 ENCOUNTER — Encounter: Payer: Self-pay | Admitting: Cardiology

## 2014-09-09 ENCOUNTER — Ambulatory Visit: Payer: Medicare Other | Admitting: Cardiology

## 2014-09-09 ENCOUNTER — Ambulatory Visit (INDEPENDENT_AMBULATORY_CARE_PROVIDER_SITE_OTHER): Payer: Medicare Other | Admitting: Cardiology

## 2014-09-09 VITALS — BP 122/70 | HR 56 | Ht 69.0 in | Wt 189.8 lb

## 2014-09-09 DIAGNOSIS — E878 Other disorders of electrolyte and fluid balance, not elsewhere classified: Secondary | ICD-10-CM

## 2014-09-09 NOTE — Progress Notes (Signed)
The patient presents for followup of his known coronary disease.  Most recent cardiac catheterization in August of 2014 demonstrated LAD proximal 90% stenosis, mid long severe diffuse disease. Ostial disease and a moderate first diagonal. Ostial disease in the second diagonal. The nondominant right coronary with 90% stenosis. LIMA to the LAD had 70% stenosis in the body of it. Vein graft to the OM and PDA was occluded. Vein graft to the diagonal is occluded. We managed him medically.  He has had a broken a hip and had problems with this. He's been treated for lymphoma.  In December he had upper GI bleeding with hemorrhagic shock and a resultant CVA. He had a non-STEMI with all of this. Most recently he's been admitted with some diastolic heart failure on 1/3.  I reviewed both of these hospitalizations and all of the records extensively. He unfortunately had a bleeding ulcer thought to be related to progression of his lymphoma. He is getting oral therapy for this lymphoma. He did have clipping of the ulcer and apparent resolution of the bleeding. He's had no acute shortness of breath since he was diureses in the hospital the second visit and his weights have been stable at home. His lower extremity swelling is improved. He has had a slower heart rate as noted by his home health nurse. He's been very tired and getting around only in the house. However, with this he is not having any shortness of breath, PND or orthopnea. He's not had any palpitations, presyncope or syncope. He's had no PND or orthopnea.   Allergies  Allergen Reactions  . Atorvastatin Other (See Comments)    Incredible dizziness    Current Outpatient Prescriptions  Medication Sig Dispense Refill  . acyclovir (ZOVIRAX) 400 MG tablet TAKE ONE TABLET BY MOUTH TWICE DAILY (Patient taking differently: Take 400 mg by mouth daily. For 6 months then stop) 60 tablet 2  . amLODipine (NORVASC) 5 MG tablet Take 1 tablet (5 mg total) by mouth daily.  30 tablet 11  . atenolol (TENORMIN) 25 MG tablet Take 3 tablets (75 mg total) by mouth daily. 90 tablet 0  . citalopram (CELEXA) 20 MG tablet TAKE ONE TABLET BY MOUTH ONCE DAILY 30 tablet 2  . furosemide (LASIX) 40 MG tablet Take 1 tablet (40 mg total) by mouth 2 (two) times daily. 60 tablet 0  . HYDROcodone-acetaminophen (NORCO/VICODIN) 5-325 MG per tablet Take 1-2 tablets by mouth every 6 (six) hours as needed for moderate pain. 100 tablet 0  . ibrutinib (IMBRUVICA) 140 MG capsul Take 4 capsules (560 mg total) by mouth daily. 120 capsule 9  . isosorbide mononitrate (IMDUR) 30 MG 24 hr tablet Take 3 tablets (90 mg total) by mouth daily. 90 tablet 0  . lactose free nutrition (BOOST PLUS) LIQD Take 237 mLs by mouth 2 (two) times daily as needed (Allow w/pt or pt's wife request after diet advancement). 60 Can 0  . methocarbamol (ROBAXIN) 500 MG tablet Take 1 tablet (500 mg total) by mouth every 6 (six) hours as needed for muscle spasms. 50 tablet 0  . nitroGLYCERIN (NITROSTAT) 0.4 MG SL tablet Place 1 tablet (0.4 mg total) under the tongue every 5 (five) minutes as needed for chest pain. 25 tablet prn  . pantoprazole (PROTONIX) 40 MG tablet Take 1 tablet (40 mg total) by mouth 2 (two) times daily. 60 tablet 2  . potassium chloride SA (K-DUR,KLOR-CON) 20 MEQ tablet Take 2 tablets (40 mEq total) by mouth 2 (two) times  daily. 60 tablet 0  . pravastatin (PRAVACHOL) 80 MG tablet Take 1 tablet (80 mg total) by mouth daily. 30 tablet 11   No current facility-administered medications for this visit.    Past Medical History  Diagnosis Date  . Hypertension   . CAD 11/12/2007    a. s/p CABG;  b. Cath 7/11 Patent LIMA to the LAD. Previously known occlusion of a saphenous vein graft to diagonal and sequential to obtuse marginals. Severe diffuse native obtuse marginal and diagonal branch vessel disease. Preserved ejection fraction.;  c. 03/2013 Lexi CL: mild inflat and inf ischemia, EF 54%->initial med rx.  Marland Kitchen  HYPERLIPIDEMIA 11/12/2007  . HYPERTENSION 11/12/2007  . PERIPHERAL VASCULAR DISEASE 11/12/2007  . ARTHRITIS 11/12/2007  . BENIGN PROSTATIC HYPERTROPHY, HX OF 11/12/2007  . Edema 10/17/2010  . INGUINAL HERNIAS, BILATERAL 11/10/2006  . Obesity, unspecified 07/05/2009  . SLEEP APNEA 11/12/2007  . History of PTCA 2005  . Osteoarthritis   . Carotid stenosis, bilateral   . Nephrolithiasis   . thymus ca dx'd 2003    Radiation comp 2003  . NHL (non-Hodgkin's lymphoma) dx'd 2008    Chemo comp 10/2010; rituxin comp 10/2010  . 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 MANTLE CELL LYMPHOMA INTRA-ABDOMINAL LYMPH NODES 11/12/2007  . Tricuspid valve regurgitation     Past Surgical History  Procedure Laterality Date  . Coronary artery bypass graft    . Cardiac catheterization  08/2009, 02/2010  . Total hip arthroplasty Left 12/09/2013    Procedure: TOTAL HIP ARTHROPLASTY;  Surgeon: Mauri Pole, MD;  Location: [facility];  Service: Orthopedics;  Laterality: Left;  . Esophagogastroduodenoscopy N/A 08/02/2014    Procedure: ESOPHAGOGASTRODUODENOSCOPY (EGD) at bedside;  Surgeon: Irene Shipper, MD;  Location: [facility];  Service: Endoscopy;  Laterality: N/A;  . Esophagogastroduodenoscopy (egd) with propofol N/A 08/10/2014    Procedure: ESOPHAGOGASTRODUODENOSCOPY (EGD) WITH PROPOFOL;  Surgeon: Milus Banister, MD;  Location: [facility];  Service: Endoscopy;  Laterality: N/A;    ROS:  As stated in the HPI and negative for all other systems.  PHYSICAL EXAM BP 122/70 mmHg  Pulse 56  Ht 5\' 9"  (1.753 m)  Wt 189 lb 12.8 oz (86.093 kg)  BMI 28.02 kg/m2 GENERAL:  Well appearing HEENT:  Pupils equal round and reactive, fundi not visualized, oral mucosa unremarkable NECK:  No jugular venous distention, waveform within normal limits, carotid upstroke brisk and symmetric, no bruits, no thyromegaly LUNGS:  Clear to auscultation bilaterally BACK:  No CVA tenderness CHEST:  Well healed sternotomy scar. HEART:  PMI not displaced or  sustained,S1 and S2 within normal limits, no S3, no S4, no clicks, no rubs, no murmurs ABD:  Flat, positive bowel sounds normal in frequency in pitch, no bruits, no rebound, no guarding, no midline pulsatile mass, no hepatomegaly, no splenomegaly EXT:  2 plus pulses throughout, moderate bilateral right greater than left lower ext edema, no cyanosis no clubbing    ASSESSMENT AND PLAN  CAD -  He has coronary anatomy as described. He has no new symptoms. No change in therapy or further intervention as needed.  The elevated cardiac enzymes were likely demand ischemia. His EF remains preserved.  Of note I did send a message to both his urologist and gastroenterologist is you there is any absolute contraindication to restarting 81 mg of aspirin  HYPERTENSION -  The blood pressure is at target. No change in medications is indicated. We will continue with therapeutic lifestyle changes (TLC).   HYPERLIPIDEMIA -  Although  he is not on target statin according to current guidelines given his ongoing medical problems I will leave him on pravastatin.  EDEMA/DIASTOLIC HF - A ongoing to decrease his Lasix to 40 mg daily and his potassium to 40 mEq daily. He'll get a basic metabolic profile in one week and we did discuss how to take when necessary Lasix and watch his salt and fluid intake.

## 2014-09-09 NOTE — Patient Instructions (Signed)
Your physician recommends that you schedule a follow-up appointment in: 6 weeks in [place] with Dr. Percival Spanish  We are decreasing your Atenolol to 50 mg daily  We are changing your lasix to 40 mg daily  We are changing your potassium to 40 MEQ daily  We will have your get some lab work in one week at Dr. Murrell Redden office

## 2014-09-09 NOTE — Telephone Encounter (Signed)
Cancel weekly lab draws at home.

## 2014-09-15 ENCOUNTER — Other Ambulatory Visit: Payer: Self-pay | Admitting: *Deleted

## 2014-09-15 ENCOUNTER — Encounter: Payer: Self-pay | Admitting: Cardiology

## 2014-09-15 DIAGNOSIS — E878 Other disorders of electrolyte and fluid balance, not elsewhere classified: Secondary | ICD-10-CM

## 2014-09-15 MED ORDER — ASPIRIN EC 81 MG PO TBEC: 81.0000 mg | DELAYED_RELEASE_TABLET | Freq: Every day | ORAL | Status: DC

## 2014-09-19 ENCOUNTER — Institutional Professional Consult (permissible substitution): Payer: Medicare Other | Admitting: Pulmonary Disease

## 2014-09-20 ENCOUNTER — Other Ambulatory Visit: Payer: Medicare Other

## 2014-09-20 ENCOUNTER — Ambulatory Visit (HOSPITAL_BASED_OUTPATIENT_CLINIC_OR_DEPARTMENT_OTHER): Payer: Medicare Other | Admitting: Hematology and Oncology

## 2014-09-20 ENCOUNTER — Ambulatory Visit (HOSPITAL_BASED_OUTPATIENT_CLINIC_OR_DEPARTMENT_OTHER): Payer: Medicare Other

## 2014-09-20 ENCOUNTER — Encounter: Payer: Self-pay | Admitting: Hematology and Oncology

## 2014-09-20 ENCOUNTER — Telehealth: Payer: Self-pay | Admitting: *Deleted

## 2014-09-20 ENCOUNTER — Telehealth: Payer: Self-pay | Admitting: Cardiology

## 2014-09-20 ENCOUNTER — Telehealth: Payer: Self-pay | Admitting: Hematology and Oncology

## 2014-09-20 ENCOUNTER — Ambulatory Visit: Payer: Medicare Other | Admitting: Hematology and Oncology

## 2014-09-20 ENCOUNTER — Other Ambulatory Visit (HOSPITAL_BASED_OUTPATIENT_CLINIC_OR_DEPARTMENT_OTHER): Payer: Medicare Other

## 2014-09-20 VITALS — BP 121/51 | HR 51 | Temp 97.6°F | Resp 18 | Ht 69.0 in | Wt 187.4 lb

## 2014-09-20 DIAGNOSIS — E875 Hyperkalemia: Secondary | ICD-10-CM

## 2014-09-20 DIAGNOSIS — R410 Disorientation, unspecified: Secondary | ICD-10-CM

## 2014-09-20 DIAGNOSIS — R001 Bradycardia, unspecified: Secondary | ICD-10-CM

## 2014-09-20 DIAGNOSIS — N19 Unspecified kidney failure: Secondary | ICD-10-CM

## 2014-09-20 DIAGNOSIS — C8313 Mantle cell lymphoma, intra-abdominal lymph nodes: Secondary | ICD-10-CM

## 2014-09-20 DIAGNOSIS — D630 Anemia in neoplastic disease: Secondary | ICD-10-CM

## 2014-09-20 DIAGNOSIS — Z95828 Presence of other vascular implants and grafts: Secondary | ICD-10-CM

## 2014-09-20 DIAGNOSIS — N182 Chronic kidney disease, stage 2 (mild): Secondary | ICD-10-CM

## 2014-09-20 LAB — CBC WITH DIFFERENTIAL/PLATELET
BASO%: 0.5 % (ref 0.0–2.0)
Basophils Absolute: 0 10*3/uL (ref 0.0–0.1)
EOS%: 5.4 % (ref 0.0–7.0)
Eosinophils Absolute: 0.4 10*3/uL (ref 0.0–0.5)
HCT: 36.2 % — ABNORMAL LOW (ref 38.4–49.9)
HEMOGLOBIN: 11.4 g/dL — AB (ref 13.0–17.1)
LYMPH%: 29.5 % (ref 14.0–49.0)
MCH: 29.2 pg (ref 27.2–33.4)
MCHC: 31.5 g/dL — ABNORMAL LOW (ref 32.0–36.0)
MCV: 92.6 fL (ref 79.3–98.0)
MONO#: 1.1 10*3/uL — ABNORMAL HIGH (ref 0.1–0.9)
MONO%: 14 % (ref 0.0–14.0)
NEUT%: 50.6 % (ref 39.0–75.0)
NEUTROS ABS: 4 10*3/uL (ref 1.5–6.5)
Platelets: 157 10*3/uL (ref 140–400)
RBC: 3.91 10*6/uL — ABNORMAL LOW (ref 4.20–5.82)
RDW: 16.5 % — ABNORMAL HIGH (ref 11.0–14.6)
WBC: 7.9 10*3/uL (ref 4.0–10.3)
lymph#: 2.3 10*3/uL (ref 0.9–3.3)

## 2014-09-20 LAB — COMPREHENSIVE METABOLIC PANEL (CC13)
ALT: 9 U/L (ref 0–55)
AST: 13 U/L (ref 5–34)
Albumin: 3.6 g/dL (ref 3.5–5.0)
Alkaline Phosphatase: 107 U/L (ref 40–150)
Anion Gap: 11 mEq/L (ref 3–11)
BUN: 33.9 mg/dL — ABNORMAL HIGH (ref 7.0–26.0)
CO2: 26 meq/L (ref 22–29)
Calcium: 8.8 mg/dL (ref 8.4–10.4)
Chloride: 101 mEq/L (ref 98–109)
Creatinine: 2.1 mg/dL — ABNORMAL HIGH (ref 0.7–1.3)
EGFR: 29 mL/min/{1.73_m2} — ABNORMAL LOW (ref >90)
Glucose: 84 mg/dl (ref 70–140)
Potassium: 5.2 mEq/L — ABNORMAL HIGH (ref 3.5–5.1)
SODIUM: 138 meq/L (ref 136–145)
TOTAL PROTEIN: 6.3 g/dL — AB (ref 6.4–8.3)
Total Bilirubin: 0.76 mg/dL (ref 0.20–1.20)

## 2014-09-20 MED ORDER — HEPARIN SOD (PORK) LOCK FLUSH 100 UNIT/ML IV SOLN
500.0000 [IU] | Freq: Once | INTRAVENOUS | Status: AC
  Administered 2014-09-20: 500 [IU] via INTRAVENOUS
  Filled 2014-09-20: qty 5

## 2014-09-20 MED ORDER — SODIUM CHLORIDE 0.9 % IJ SOLN
10.0000 mL | INTRAMUSCULAR | Status: DC | PRN
  Administered 2014-09-20: 10 mL via INTRAVENOUS
  Filled 2014-09-20: qty 10

## 2014-09-20 NOTE — Telephone Encounter (Signed)
pt called to cx appt no reason why....emailed MD for sooner appt then the end of Feb..Marland KitchenMarland Kitcheni will call pt with new d.t

## 2014-09-20 NOTE — Telephone Encounter (Signed)
I called and spoke with Mrs. Martin Morris regarding an appointment for Mr. Martin Morris with Dr. Percival Spanish.  [name] states that her husband had blood work at Dr. Murrell Redden office and was told that his Potassium level is still high.  Dr. Murrell Redden office stated their office had faxed the patient 's lab results to Dr. Percival Spanish.  Please advise.

## 2014-09-20 NOTE — Patient Instructions (Signed)

## 2014-09-20 NOTE — Telephone Encounter (Signed)
added appt per staff////per pof pt ok and aware of appt

## 2014-09-20 NOTE — Telephone Encounter (Signed)
-----   Message from Heath Lark, MD sent at 09/20/2014  9:34 AM EST ----- Regarding: RE: pt appt This is not good. Cameo, please tell his wife to stop his chemo pills (irbutinib). They should also consider hospice or come back when he is better to have another discussion ----- Message -----    From: Ilean China    Sent: 09/20/2014   9:09 AM      To: Barbaraann Cao, MD Subject: RE: pt appt                                    She says is not not feeling well, not eating, lost weight.   ----- Message -----    From: Heath Lark, MD    Sent: 09/20/2014   9:08 AM      To: Cathlean Cower, RN, Ilean China Subject: RE: pt appt                                    Cameo,  Can you find out why? It is not safe to continue chemo without labs monitoring and MD visit. ----- Message -----    From: Ilean China    Sent: 09/20/2014   9:06 AM      To: Barbaraann Cao, MD Subject: pt appt                                        Hello,  Mrs. Melone called and cx her husbands appts with no reason why. Your next available is not until 1.22.16 is that too far away?

## 2014-09-20 NOTE — Telephone Encounter (Signed)
Called wife regarding canceled appt today.  She states pt has had a "rough few days."  Says he has a lot of back pain and some periods of confusion.  He is still taking Ibrutinib.  Informed her pt needs to have labs and needs to be seen by Dr. Alvy Bimler.  If pt can't make it he needs to stop his Ibrutinib until he can get lab work.  He may need to go to hospital if his condition worsens.   Wife verbalized understanding.  She is going to try to bring him in this afternoon by 3 pm.  Instructed her to try to come sooner, but no later than 3 pm.  Call us if they can;t make it.. She verbalized understanding.

## 2014-09-20 NOTE — Telephone Encounter (Signed)
Gave avs & calendar for February. See referral notes.

## 2014-09-21 NOTE — Assessment & Plan Note (Signed)
This is medication related. I will reduce the dose of beta blocker and inform his cardiologist about medical changes.

## 2014-09-21 NOTE — Telephone Encounter (Signed)
Pt.s potassium was still just slightly high at  5.2  , pt.s wife wanted to get an earlier than April and wanted to see you next week due to pt.s declining health, appt. Being scheduled as I write this note

## 2014-09-21 NOTE — Assessment & Plan Note (Signed)
This is likely anemia of chronic disease, recent GI bleed and chronic kidney disease. The patient denies recent history of bleeding such as epistaxis, hematuria or hematochezia. He is asymptomatic from the anemia. We will observe for now.

## 2014-09-21 NOTE — Assessment & Plan Note (Signed)
His wife reported intermittent confusion and increased sedation home. I suspect this is metabolic related to worsening renal failure and mild hypo-tension. I recommend nephrology consultation and reduce the dose of his medications.

## 2014-09-21 NOTE — Progress Notes (Signed)
[facility] [place] OFFICE PROGRESS NOTE  Patient Care Team: Dione Housekeeper, MD as PCP - General (Family Medicine) Heath Lark, MD as Consulting Physician (Hematology and Oncology) Irene Shipper, MD as Consulting Physician (Gastroenterology)  SUMMARY OF ONCOLOGIC HISTORY:  I reviewed the patient's records extensive and collaborated the history with the patient. Summary of Martin Morris history is as follows: Martin Morris was diagnosed with Mantle cell lymphoma diagnosed in March 2008 with positive bone marrow initially on 12/03/2006 and then negative bone marrow after treatment in October 2009. The patient presented with obstructive liver abnormalities requiring ERCP and non metal stent placement by Dr. Erskine Emery. The stent was subsequently removed in October 2008.  The patient was treated with 8 cycles of Rituxan, Cytoxan, vincristine and Decadron in combination with Neulasta from 12/19/2006 through 05/15/2007. The patient then received maintenance Rituxan from July 16, 2007 through February 01, 2010. While on Rituxan, Martin Morris had a recurrence of Martin Morris disease by CT scan carried out on 01/24/2010. Martin Morris then received treatment with bendamustine and Rituxan in combination with Neulasta for 6 cycles from 02/19/2010 through 07/26/2010. PET scan from 08/22/2010 and CT scans of chest, abdomen, and pelvis from 01/01/2011 that showed no evidence of disease. Martin Morris had been off treatment since December 2011 and had been doing well without any symptoms until the CT scan of the abdomen and pelvis on 07/01/2011 showed signs of recurrence. CT scans of abdomen and pelvis with IVC on 09/02/11 showed further progression. Rituxan, subcutaneous Velcade, intravenous Cytoxan and Decadron were initiated on 10/15/2011. CT scans of the abdomen and pelvis carried out on 01/08/2012 showed a near complete response to therapy. CT scan of the abdomen and pelvis with IV contrast carried out on 06/19/2012 showed stable plaque-like soft tissue density  in the upper abdominal retroperitoneum compared with the CT scan of 01/08/2012. CT scan of the abdomen and pelvis with IV contrast carried out on 11/30/2012 showed no evidence of recurrent lymphoma.  As of 08/05/2013, treatment program will be as follows:  -Velcade 2.75 mg subcu.  -Cytoxan 400 mg IV.  -Decadron 20 mg IV.  The above drugs will be given every 3 weeks.  -Rituxan 800 mg IV every 6 weeks.  Previously Velcade, Cytoxan, and Decadron were being administered every 2 weeks, and Rituxan was being administered every 4 weeks. (from 12/04/2012 - 08/05/2013). Prior to that  beginning on 10/15/2011 - 12/04/2012, Velcade, Cytoxan, and Decadron were being administered weekly, and Rituxan was being administered every 2 weeks. In August of 2014, Martin Morris was scheduled  for chemotherapy but had chest pain and it was held. Martin Morris underwent a cardiac cath which revealed two of three native vessel ischemic disease on 04/13/2013 but Martin Morris was a poor surgical candidate for a re-do CABG. Martin Morris therapy was re-started in October 2014.   Overall Martin Morris's tolerated Martin Morris treatment without difficulty.  In April Martin Morris had an accidental fall resulting in a left hip fracture.  Martin Morris underwent L THR on 12/09/2013 by Dr. Paralee Cancel.  Martin Morris was discharged to rehab.  Prior to Martin Morris fall, Martin Morris had restaging scans including CT C/A/P on 04/09 which demonstrated absence of recurrent lymphoma in the chest, abdomen or pelvis.  Repeat PET/CT scan on 07/27/2014 show significant disease progression. Rituximab was discontinued  The patient was admitted to the hospital from 08/02/2014 to 08/12/2014 due to significant GI bleed from malignant tumor in Martin Morris stomach that was cauterized , biopsy proven to be recurrence of lymphoma.  Martin Morris was in the intensive care  unit due to hemorrhagic shock requiring significant blood transfusion. The patient also developed mild acute encephalopathy with minor neurological deficit which subsequently resolved. Between 08/28/2014 to 09/02/2014,  Martin Morris was admitted to the hospital with acute on chronic heart failure, chronic kidney disease and Escherichia coli sepsis  INTERVAL HISTORY: Please see below for problem oriented charting. Martin Morris is seen urgently because of reported history of intermittent confusion, excessive somnolence with sleeping more than 15 hours per day, reduced oral intake and reduced appetite. Martin Morris denies shortness of breath or chest pain. Martin Morris complained of profound weakness. Martin Morris wife reported intermittent mental confusion.  REVIEW OF SYSTEMS:   Constitutional: Denies fevers, chills Eyes: Denies blurriness of vision Ears, nose, mouth, throat, and face: Denies mucositis or sore throat Respiratory: Denies cough, dyspnea or wheezes Cardiovascular: Denies palpitation, chest discomfort or lower extremity swelling Gastrointestinal:  Denies nausea, heartburn or change in bowel habits Skin: Denies abnormal skin rashes Lymphatics: Denies new lymphadenopathy or easy bruising Neurological:Denies numbness, tingling  Behavioral/Psych: Mood is stable, no new changes  All other systems were reviewed with the patient and are negative.  I have reviewed the past medical history, past surgical history, social history and family history with the patient and they are unchanged from previous note.  ALLERGIES:  is allergic to atorvastatin.  MEDICATIONS:  Current Outpatient Prescriptions  Medication Sig Dispense Refill  . acyclovir (ZOVIRAX) 400 MG tablet TAKE ONE TABLET BY MOUTH TWICE DAILY (Patient taking differently: Take 400 mg by mouth daily. For 6 months then stop) 60 tablet 2  . amLODipine (NORVASC) 5 MG tablet Take 1 tablet (5 mg total) by mouth daily. 30 tablet 11  . aspirin EC 81 MG tablet Take 1 tablet (81 mg total) by mouth daily. 90 tablet 3  . atenolol (TENORMIN) 50 MG tablet Take 50 mg by mouth daily.    . citalopram (CELEXA) 20 MG tablet TAKE ONE TABLET BY MOUTH ONCE DAILY 30 tablet 2  . furosemide (LASIX) 40 MG tablet Take  40 mg by mouth daily.    Marland Kitchen HYDROcodone-acetaminophen (NORCO/VICODIN) 5-325 MG per tablet Take 1-2 tablets by mouth every 6 (six) hours as needed for moderate pain. 100 tablet 0  . ibrutinib (IMBRUVICA) 140 MG capsul Take 4 capsules (560 mg total) by mouth daily. 120 capsule 9  . isosorbide mononitrate (IMDUR) 30 MG 24 hr tablet Take 3 tablets (90 mg total) by mouth daily. 90 tablet 0  . lactose free nutrition (BOOST PLUS) LIQD Take 237 mLs by mouth 2 (two) times daily as needed (Allow w/pt or pt's wife request after diet advancement). 60 Can 0  . methocarbamol (ROBAXIN) 500 MG tablet Take 1 tablet (500 mg total) by mouth every 6 (six) hours as needed for muscle spasms. 50 tablet 0  . nitroGLYCERIN (NITROSTAT) 0.4 MG SL tablet Place 1 tablet (0.4 mg total) under the tongue every 5 (five) minutes as needed for chest pain. 25 tablet prn  . pantoprazole (PROTONIX) 40 MG tablet Take 1 tablet (40 mg total) by mouth 2 (two) times daily. 60 tablet 2  . potassium chloride SA (K-DUR,KLOR-CON) 20 MEQ tablet Take 40 mEq by mouth daily.    . pravastatin (PRAVACHOL) 80 MG tablet Take 1 tablet (80 mg total) by mouth daily. 30 tablet 11   No current facility-administered medications for this visit.    PHYSICAL EXAMINATION: ECOG PERFORMANCE STATUS: 3 - Symptomatic, >50% confined to bed  Filed Vitals:   09/20/14 1506  BP: 121/51  Pulse: 51  Temp:  97.6 F (36.4 C)  Resp: 18   Filed Weights   09/20/14 1506  Weight: 187 lb 6.4 oz (85.004 kg)    GENERAL:alert, no distress and comfortable SKIN: skin color, texture, turgor are normal, no rashes or significant lesions EYES: normal, Conjunctiva are pink and non-injected, sclera clear OROPHARYNX:no exudate, no erythema and lips, buccal mucosa, and tongue normal  NECK: supple, thyroid normal size, non-tender, without nodularity LYMPH:  no palpable lymphadenopathy in the cervical, axillary or inguinal LUNGS: clear to auscultation and percussion with normal  breathing effort HEART: regular rate & rhythm and no murmurs with moderate bilateral lower extremity edema ABDOMEN:abdomen soft, non-tender and normal bowel sounds Musculoskeletal:no cyanosis of digits and no clubbing  NEURO: alert & oriented x 3 with fluent speech, no focal motor/sensory deficits  LABORATORY DATA:  I have reviewed the data as listed    Component Value Date/Time   NA 138 09/20/2014 1433   NA 141 09/02/2014 0435   NA 142 09/02/2011 0936   K 5.2* 09/20/2014 1433   K 3.3* 09/02/2014 0435   K 4.1 09/02/2011 0936   CL 96 09/02/2014 0435   CL 106 01/29/2013 0900   CL 98 09/02/2011 0936   CO2 26 09/20/2014 1433   CO2 35* 09/02/2014 0435   CO2 29 09/02/2011 0936   GLUCOSE 84 09/20/2014 1433   GLUCOSE 119* 09/02/2014 0435   GLUCOSE 101* 01/29/2013 0900   GLUCOSE 96 09/02/2011 0936   BUN 33.9* 09/20/2014 1433   BUN 27* 09/02/2014 0435   BUN 17 09/02/2011 0936   CREATININE 2.1* 09/20/2014 1433   CREATININE 1.58* 09/02/2014 0435   CREATININE 0.8 09/02/2011 0936   CALCIUM 8.8 09/20/2014 1433   CALCIUM 8.7 09/02/2014 0435   CALCIUM 8.5 09/02/2011 0936   PROT 6.3* 09/20/2014 1433   PROT 5.9* 09/02/2014 0435   PROT 7.1 09/02/2011 0936   ALBUMIN 3.6 09/20/2014 1433   ALBUMIN 3.1* 09/02/2014 0435   AST 13 09/20/2014 1433   AST 24 09/02/2014 0435   AST 21 09/02/2011 0936   ALT 9 09/20/2014 1433   ALT 14 09/02/2014 0435   ALT 17 09/02/2011 0936   ALKPHOS 107 09/20/2014 1433   ALKPHOS 101 09/02/2014 0435   ALKPHOS 72 09/02/2011 0936   BILITOT 0.76 09/20/2014 1433   BILITOT 0.7 09/02/2014 0435   BILITOT 0.60 09/02/2011 0936   GFRNONAA 40* 09/02/2014 0435   GFRAA 46* 09/02/2014 0435    No results found for: SPEP, UPEP  Lab Results  Component Value Date   WBC 7.9 09/20/2014   NEUTROABS 4.0 09/20/2014   HGB 11.4* 09/20/2014   HCT 36.2* 09/20/2014   MCV 92.6 09/20/2014   PLT 157 09/20/2014      Chemistry      Component Value Date/Time   NA 138 09/20/2014  1433   NA 141 09/02/2014 0435   NA 142 09/02/2011 0936   K 5.2* 09/20/2014 1433   K 3.3* 09/02/2014 0435   K 4.1 09/02/2011 0936   CL 96 09/02/2014 0435   CL 106 01/29/2013 0900   CL 98 09/02/2011 0936   CO2 26 09/20/2014 1433   CO2 35* 09/02/2014 0435   CO2 29 09/02/2011 0936   BUN 33.9* 09/20/2014 1433   BUN 27* 09/02/2014 0435   BUN 17 09/02/2011 0936   CREATININE 2.1* 09/20/2014 1433   CREATININE 1.58* 09/02/2014 0435   CREATININE 0.8 09/02/2011 0936      Component Value Date/Time   CALCIUM 8.8 09/20/2014 1433  CALCIUM 8.7 09/02/2014 0435   CALCIUM 8.5 09/02/2011 0936   ALKPHOS 107 09/20/2014 1433   ALKPHOS 101 09/02/2014 0435   ALKPHOS 72 09/02/2011 0936   AST 13 09/20/2014 1433   AST 24 09/02/2014 0435   AST 21 09/02/2011 0936   ALT 9 09/20/2014 1433   ALT 14 09/02/2014 0435   ALT 17 09/02/2011 0936   BILITOT 0.76 09/20/2014 1433   BILITOT 0.7 09/02/2014 0435   BILITOT 0.60 09/02/2011 0936      ASSESSMENT & PLAN:  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 Continue same dose without adjustment.   Anemia in neoplastic disease This is likely anemia of chronic disease, recent GI bleed and chronic kidney disease. The patient denies recent history of bleeding such as epistaxis, hematuria or hematochezia. Martin Morris is asymptomatic from the anemia. We will observe for now.     Chronic kidney disease (CKD), stage II (mild) Martin Morris renal failure is worse, likely related to mild hypotension and dehydration. I will reduce the dose of Martin Morris Lasix and encourage oral fluid intake. I will refer him to nephrologist for further management.   Hyperkalemia I recommend Martin Morris reduces oral potassium intake. This was prescribed because Martin Morris was taking high-dose diuretic.   Confusion Martin Morris wife reported intermittent confusion and increased sedation home. I suspect this is metabolic related to worsening renal failure and mild  hypo-tension. I recommend nephrology consultation and reduce the dose of Martin Morris medications.   Bradycardia This is medication related. I will reduce the dose of beta blocker and inform Martin Morris cardiologist about medical changes.    Orders Placed This Encounter  Procedures  . Ambulatory referral to Nephrology    Referral Priority:  Urgent    Referral Type:  Consultation    Referral Reason:  Specialty Services Required    Requested Specialty:  Nephrology    Number of Visits Requested:  1   All questions were answered. The patient knows to call the clinic with any problems, questions or concerns. No barriers to learning was detected. I spent 30 minutes counseling the patient face to face. The total time spent in the appointment was 40 minutes and more than 50% was on counseling and review of test results     [facility], [place], MD 09/21/2014 7:58 AM

## 2014-09-21 NOTE — Assessment & Plan Note (Addendum)
His renal failure is worse, likely related to mild hypotension and dehydration. I will reduce the dose of his Lasix and encourage oral fluid intake. I will refer him to nephrologist for further management.

## 2014-09-21 NOTE — Assessment & Plan Note (Signed)
I recommend he reduces oral potassium intake. This was prescribed because he was taking high-dose diuretic.

## 2014-09-21 NOTE — Assessment & Plan Note (Addendum)
Clinically, he appears to be responding to iburtinib. Continue same dose without adjustment.

## 2014-09-22 ENCOUNTER — Telehealth: Payer: Self-pay | Admitting: *Deleted

## 2014-09-22 NOTE — Telephone Encounter (Signed)
PT. HAS HAD A SEVERE HEADACHE FOR TWO DAYS ON A PAIN SCALE OF TEN. PAIN MEDICATION HELPS A LITTLE. SINCE YESTERDAY AFTERNOON PT. HAS HAD NO SENSATION WHEN HE URINATES. HE DOES HAVE SENSATION IN HIS LEGS AND FEET BUT TOO WEAK TO WALK. NO FEVER. PT. HAS A COUGH WITH CLEAR MUCUS. NO FEVER. HE IS NAUSEATED AND HAS VOMITED A SMALL AMOUNT BUT NO BLOOD. IN THE PAST 24 HOURS PT. HAS HAD 52 OUNCES OF FLUID BUT HAS EATEN VERY LITTLE. "PT'S FACE IS VERY RED". NO CONFUSION THIS MORNING. PT.'S WIFE STATES "SOMETHING IS NOT RIGHT".

## 2014-09-22 NOTE — Telephone Encounter (Signed)
He needs to go to the ER 

## 2014-09-22 NOTE — Telephone Encounter (Signed)
Instructed wife ,per Dr. Alvy Bimler, to take pt to ED for symptoms below.  She states she will call ambulance to take pt to [facility] ED .Marland Kitchen  He is too weak for her to get him in her car.  She will call them now.

## 2014-09-23 ENCOUNTER — Telehealth: Payer: Self-pay | Admitting: Hematology and Oncology

## 2014-09-23 ENCOUNTER — Ambulatory Visit: Payer: Medicare Other | Admitting: Hematology and Oncology

## 2014-09-23 ENCOUNTER — Telehealth: Payer: Self-pay | Admitting: Cardiology

## 2014-09-23 ENCOUNTER — Telehealth: Payer: Self-pay | Admitting: *Deleted

## 2014-09-23 NOTE — Telephone Encounter (Signed)
Faxed pt medical records to [facility] °

## 2014-09-23 NOTE — Telephone Encounter (Signed)
Wife calling about referral to [facility].  She was under impression that it would only take a day or two to get an appt..  Informed wife it can take more than a few days,  Sometimes a few weeks to get appt and we do not have any control over how soon the [facility] will see pt..  We have made the referral and sent records for their review.  Hopefully they will agree referral is urgent and get pt an appt soon.   Wife said she is very worried because pt continues to do poorly.  He continues to have severe headaches,  Confusion, weakness and now has a rash from back of his knees all the way up to the middle of his back.  He also c/o earache today.   She says he would not let her call ambulance yesterday to take him to ED.  She says he got very upset and did not want to go to the hospital.  Urged wife to call 911 to take pt to hospital now.  Told wife it sounds like pt is very ill and needs to be in the hospital.  Told her that Dr. Alvy Bimler says pt needs to go to the hospital.  She verbalized understanding and says she will try to get pt's son involved as pt may agree to go if his son tells him.   Asked her to let us know what happens.

## 2014-09-26 NOTE — Telephone Encounter (Signed)
Close encounter 

## 2014-09-27 ENCOUNTER — Ambulatory Visit (INDEPENDENT_AMBULATORY_CARE_PROVIDER_SITE_OTHER): Payer: Medicare Other | Admitting: Cardiology

## 2014-09-27 ENCOUNTER — Encounter: Payer: Self-pay | Admitting: Cardiology

## 2014-09-27 VITALS — BP 120/70 | HR 72 | Ht 69.0 in | Wt 189.9 lb

## 2014-09-27 DIAGNOSIS — E878 Other disorders of electrolyte and fluid balance, not elsewhere classified: Secondary | ICD-10-CM

## 2014-09-27 NOTE — Progress Notes (Signed)
The patient presents for followup of his known coronary disease.  Most recent cardiac catheterization in August of 2014 demonstrated LAD proximal 90% stenosis, mid long severe diffuse disease. Ostial disease and a moderate first diagonal. Ostial disease in the second diagonal. The nondominant right coronary with 90% stenosis. LIMA to the LAD had 70% stenosis in the body of it. Vein graft to the OM and PDA was occluded. Vein graft to the diagonal is occluded. We managed him medically.  He has had a broken a hip and had problems with this. He's been treated for lymphoma.  In December he had upper GI bleeding with hemorrhagic shock and a resultant CVA. He had a non-STEMI with all of this. Most recently he's been admitted with some diastolic heart failure on 1/3.  I reviewed both of these hospitalizations and all of the records extensively. He unfortunately had a bleeding ulcer thought to be related to progression of his lymphoma. He is getting oral therapy for this lymphoma. He did have clipping of the ulcer and apparent resolution of the bleeding.   At the last visit we restarted low dose of aspirin. I reduced his diuretic and his potassium.   When he saw his oncologist his creatinine is elevated. She reduced his potassium and Lasix. His heart rate was low and she reduced his beta blocker. His last creatinine was 2.1. His potassium was very mildly elevated. His weights have been stable. His lower extremity edema seems to be somewhat improved. He's not having any chest pressure, neck or arm discomfort. He's very weak. However, he's not describing any acute shortness of breath, PND or orthopnea. He's had no palpitations, presyncope or syncope.  Allergies  Allergen Reactions  . Atorvastatin Other (See Comments)    Incredible dizziness    Current Outpatient Prescriptions  Medication Sig Dispense Refill  . amLODipine (NORVASC) 5 MG tablet Take 1 tablet (5 mg total) by mouth daily. 30 tablet 11  . aspirin  EC 81 MG tablet Take 1 tablet (81 mg total) by mouth daily. 90 tablet 3  . atenolol (TENORMIN) 50 MG tablet Take 50 mg by mouth daily. Pt takes 1/2 tablet (25 mg)    . citalopram (CELEXA) 20 MG tablet TAKE ONE TABLET BY MOUTH ONCE DAILY 30 tablet 2  . furosemide (LASIX) 40 MG tablet Take 40 mg by mouth daily. Pt takes 1/2 tablet (20 mg )    . HYDROcodone-acetaminophen (NORCO/VICODIN) 5-325 MG per tablet Take 1-2 tablets by mouth every 6 (six) hours as needed for moderate pain. (Patient taking differently: Take 1-2 tablets by mouth as needed for moderate pain. ) 100 tablet 0  . ibrutinib (IMBRUVICA) 140 MG capsul Take 4 capsules (560 mg total) by mouth daily. 120 capsule 9  . isosorbide mononitrate (IMDUR) 30 MG 24 hr tablet Take 3 tablets (90 mg total) by mouth daily. 90 tablet 0  . lactose free nutrition (BOOST PLUS) LIQD Take 237 mLs by mouth 2 (two) times daily as needed (Allow w/pt or pt's wife request after diet advancement). 60 Can 0  . methocarbamol (ROBAXIN) 500 MG tablet Take 1 tablet (500 mg total) by mouth every 6 (six) hours as needed for muscle spasms. (Patient taking differently: Take 500 mg by mouth as needed for muscle spasms. ) 50 tablet 0  . nitroGLYCERIN (NITROSTAT) 0.4 MG SL tablet Place 1 tablet (0.4 mg total) under the tongue every 5 (five) minutes as needed for chest pain. 25 tablet prn  . pantoprazole (PROTONIX) 40  MG tablet Take 1 tablet (40 mg total) by mouth 2 (two) times daily. 60 tablet 2  . potassium chloride SA (K-DUR,KLOR-CON) 20 MEQ tablet Take 20 mEq by mouth daily.     . pravastatin (PRAVACHOL) 80 MG tablet Take 1 tablet (80 mg total) by mouth daily. 30 tablet 11   No current facility-administered medications for this visit.    Past Medical History  Diagnosis Date  . Hypertension   . CAD 11/12/2007    a. s/p CABG;  b. Cath 7/11 Patent LIMA to the LAD. Previously known occlusion of a saphenous vein graft to diagonal and sequential to obtuse marginals. Severe  diffuse native obtuse marginal and diagonal branch vessel disease. Preserved ejection fraction.;  c. 03/2013 Lexi CL: mild inflat and inf ischemia, EF 54%->initial med rx.  Marland Kitchen HYPERLIPIDEMIA 11/12/2007  . HYPERTENSION 11/12/2007  . PERIPHERAL VASCULAR DISEASE 11/12/2007  . ARTHRITIS 11/12/2007  . BENIGN PROSTATIC HYPERTROPHY, HX OF 11/12/2007  . Edema 10/17/2010  . INGUINAL HERNIAS, BILATERAL 11/10/2006  . Obesity, unspecified 07/05/2009  . SLEEP APNEA 11/12/2007  . History of PTCA 2005  . Osteoarthritis   . Carotid stenosis, bilateral   . Nephrolithiasis   . thymus ca dx'd 2003    Radiation comp 2003  . NHL (non-Hodgkin's lymphoma) dx'd 2008    Chemo comp 10/2010; rituxin comp 10/2010  . 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 MANTLE CELL LYMPHOMA INTRA-ABDOMINAL LYMPH NODES 11/12/2007  . Tricuspid valve regurgitation   . Prerenal renal failure 09/20/2014    Past Surgical History  Procedure Laterality Date  . Coronary artery bypass graft    . Cardiac catheterization  08/2009, 02/2010  . Total hip arthroplasty Left 12/09/2013    Procedure: TOTAL HIP ARTHROPLASTY;  Surgeon: Mauri Pole, MD;  Location: [facility];  Service: Orthopedics;  Laterality: Left;  . Esophagogastroduodenoscopy N/A 08/02/2014    Procedure: ESOPHAGOGASTRODUODENOSCOPY (EGD) at bedside;  Surgeon: Irene Shipper, MD;  Location: [facility];  Service: Endoscopy;  Laterality: N/A;  . Esophagogastroduodenoscopy (egd) with propofol N/A 08/10/2014    Procedure: ESOPHAGOGASTRODUODENOSCOPY (EGD) WITH PROPOFOL;  Surgeon: Milus Banister, MD;  Location: [facility];  Service: Endoscopy;  Laterality: N/A;    ROS:  As stated in the HPI and negative for all other systems.  PHYSICAL EXAM BP 120/70 mmHg  Pulse 72  Ht 5\' 9"  (1.753 m)  Wt 189 lb 14.4 oz (86.138 kg)  BMI 28.03 kg/m2 PHYSICAL EXAM GEN:  No distress , mildly chronically ill-appearing NECK:  No jugular venous distention at 90 degrees, waveform within normal limits, carotid upstroke  brisk and symmetric, no bruits, no thyromegaly LYMPHATICS:  No cervical adenopathy LUNGS:   Diffuse bilateral coarse crackles BACK:  No CVA tenderness CHEST:  Unremarkable , Port-A-Cath in place HEART:  S1 and S2 within normal limits, no S3, no S4, no clicks, no rubs, no murmurs ABD:  Positive bowel sounds normal in frequency in pitch, no bruits, no rebound, no guarding, unable to assess midline mass or bruit with the patient seated. EXT:  2 plus pulses throughout,  Mild bilateral lower extremityedema, no cyanosis no clubbing SKIN:  No rashes no nodules NEURO:  Cranial nerves II through XII grossly intact, motor grossly intact throughout PSYCH:  Cognitively intact, oriented to person place and time    ASSESSMENT AND PLAN  CAD -  He has coronary anatomy as described. He has no new symptoms. No change in therapy or further intervention as needed.   He will continue the meds  as listed.  HYPERTENSION -  The blood pressure is at target. No change in medications is indicated. We will continue with therapeutic lifestyle changes (TLC).   HYPERLIPIDEMIA -  Although he is not on target statin according to current guidelines given his ongoing medical problems I will leave him on pravastatin.  EDEMA/DIASTOLIC HF - For now he will remain on his 20 mg of Lasix. I will stop his potassium. I will check another basic metabolic profile. I did discuss with his wife when necessary diuretic dosing it is or elevated and I will talk to her about this.

## 2014-09-27 NOTE — Patient Instructions (Signed)
Your physician recommends that you schedule a follow-up appointment in: one year with Dr. Percival Spanish  Stop taking your potassium  Have you lab done today

## 2014-10-05 ENCOUNTER — Telehealth: Payer: Self-pay | Admitting: *Deleted

## 2014-10-05 NOTE — Telephone Encounter (Signed)
Received notice from [facility] that pt has appt there on 2/12 w/ Dr. Justin Mend at 12:00 pm.   Faxed over copy of pt's insurance card per their request. Called wife and she is aware of this appt.. She states pt continues to have severe headaches w/ intermittent earaches and the only thing that seems to help is to give him his pain medication.   She says he has refused to go to the ED.  She confirmed pt's next appt here w/ Dr. Alvy Bimler.  Instructed wife to call if anything changes before his next appt.. Also reinforced for her to try to take pt to ED if his headaches get worse or his pain is unmanageable.  She verbalized understanding.

## 2014-10-07 ENCOUNTER — Other Ambulatory Visit: Payer: Self-pay | Admitting: Cardiology

## 2014-10-07 MED ORDER — ISOSORBIDE MONONITRATE ER 30 MG PO TB24: 90.0000 mg | ORAL_TABLET | Freq: Every day | ORAL | Status: AC

## 2014-10-07 NOTE — Telephone Encounter (Signed)
Rx(s) sent to pharmacy electronically. LM that Rx was sent in to pharmacy

## 2014-10-07 NOTE — Telephone Encounter (Signed)
°  1. Which medications need to be refilled? Isosorbide   2. Which pharmacy is medication to be sent to?[facility] in [place]  3. Do they need a 30 day or 90 day supply? 30  4. Would they like a call back once the medication has been sent to the pharmacy? Yes because he has been out for 2 days

## 2014-10-08 ENCOUNTER — Emergency Department (HOSPITAL_COMMUNITY): Payer: Medicare Other

## 2014-10-08 ENCOUNTER — Encounter (HOSPITAL_COMMUNITY): Payer: Self-pay | Admitting: *Deleted

## 2014-10-08 ENCOUNTER — Inpatient Hospital Stay (HOSPITAL_COMMUNITY)
Admission: EM | Admit: 2014-10-08 | Discharge: 2014-10-10 | DRG: 690 | Disposition: A | Discharge status: Home Health | Payer: Medicare Other | Attending: Internal Medicine | Admitting: Internal Medicine

## 2014-10-08 DIAGNOSIS — Z6826 Body mass index (BMI) 26.0-26.9, adult: Secondary | ICD-10-CM | POA: Diagnosis not present

## 2014-10-08 DIAGNOSIS — Z8673 Personal history of transient ischemic attack (TIA), and cerebral infarction without residual deficits: Secondary | ICD-10-CM | POA: Diagnosis not present

## 2014-10-08 DIAGNOSIS — E785 Hyperlipidemia, unspecified: Secondary | ICD-10-CM | POA: Diagnosis present

## 2014-10-08 DIAGNOSIS — I10 Essential (primary) hypertension: Secondary | ICD-10-CM | POA: Diagnosis present

## 2014-10-08 DIAGNOSIS — G4733 Obstructive sleep apnea (adult) (pediatric): Secondary | ICD-10-CM | POA: Diagnosis present

## 2014-10-08 DIAGNOSIS — R32 Unspecified urinary incontinence: Secondary | ICD-10-CM | POA: Diagnosis present

## 2014-10-08 DIAGNOSIS — N182 Chronic kidney disease, stage 2 (mild): Secondary | ICD-10-CM | POA: Diagnosis present

## 2014-10-08 DIAGNOSIS — D630 Anemia in neoplastic disease: Secondary | ICD-10-CM | POA: Diagnosis present

## 2014-10-08 DIAGNOSIS — M1990 Unspecified osteoarthritis, unspecified site: Secondary | ICD-10-CM | POA: Diagnosis present

## 2014-10-08 DIAGNOSIS — E669 Obesity, unspecified: Secondary | ICD-10-CM | POA: Diagnosis present

## 2014-10-08 DIAGNOSIS — Z79891 Long term (current) use of opiate analgesic: Secondary | ICD-10-CM | POA: Diagnosis not present

## 2014-10-08 DIAGNOSIS — E440 Moderate protein-calorie malnutrition: Secondary | ICD-10-CM | POA: Diagnosis present

## 2014-10-08 DIAGNOSIS — Z87442 Personal history of urinary calculi: Secondary | ICD-10-CM | POA: Diagnosis not present

## 2014-10-08 DIAGNOSIS — Z951 Presence of aortocoronary bypass graft: Secondary | ICD-10-CM

## 2014-10-08 DIAGNOSIS — C8313 Mantle cell lymphoma, intra-abdominal lymph nodes: Secondary | ICD-10-CM | POA: Diagnosis present

## 2014-10-08 DIAGNOSIS — N390 Urinary tract infection, site not specified: Principal | ICD-10-CM

## 2014-10-08 DIAGNOSIS — Z79899 Other long term (current) drug therapy: Secondary | ICD-10-CM | POA: Diagnosis not present

## 2014-10-08 DIAGNOSIS — Z8249 Family history of ischemic heart disease and other diseases of the circulatory system: Secondary | ICD-10-CM | POA: Diagnosis not present

## 2014-10-08 DIAGNOSIS — R627 Adult failure to thrive: Secondary | ICD-10-CM | POA: Diagnosis present

## 2014-10-08 DIAGNOSIS — Z888 Allergy status to other drugs, medicaments and biological substances status: Secondary | ICD-10-CM | POA: Diagnosis not present

## 2014-10-08 DIAGNOSIS — C8318 Mantle cell lymphoma, lymph nodes of multiple sites: Secondary | ICD-10-CM | POA: Diagnosis present

## 2014-10-08 DIAGNOSIS — K259 Gastric ulcer, unspecified as acute or chronic, without hemorrhage or perforation: Secondary | ICD-10-CM | POA: Diagnosis present

## 2014-10-08 DIAGNOSIS — I739 Peripheral vascular disease, unspecified: Secondary | ICD-10-CM | POA: Diagnosis present

## 2014-10-08 DIAGNOSIS — Z96642 Presence of left artificial hip joint: Secondary | ICD-10-CM | POA: Diagnosis present

## 2014-10-08 DIAGNOSIS — R51 Headache: Secondary | ICD-10-CM

## 2014-10-08 DIAGNOSIS — R112 Nausea with vomiting, unspecified: Secondary | ICD-10-CM | POA: Diagnosis not present

## 2014-10-08 DIAGNOSIS — Z923 Personal history of irradiation: Secondary | ICD-10-CM

## 2014-10-08 DIAGNOSIS — Z9221 Personal history of antineoplastic chemotherapy: Secondary | ICD-10-CM | POA: Diagnosis not present

## 2014-10-08 DIAGNOSIS — I5032 Chronic diastolic (congestive) heart failure: Secondary | ICD-10-CM | POA: Diagnosis present

## 2014-10-08 DIAGNOSIS — I252 Old myocardial infarction: Secondary | ICD-10-CM | POA: Diagnosis not present

## 2014-10-08 DIAGNOSIS — I129 Hypertensive chronic kidney disease with stage 1 through stage 4 chronic kidney disease, or unspecified chronic kidney disease: Secondary | ICD-10-CM | POA: Diagnosis present

## 2014-10-08 DIAGNOSIS — D72829 Elevated white blood cell count, unspecified: Secondary | ICD-10-CM | POA: Diagnosis present

## 2014-10-08 DIAGNOSIS — Z7982 Long term (current) use of aspirin: Secondary | ICD-10-CM | POA: Diagnosis not present

## 2014-10-08 DIAGNOSIS — R1110 Vomiting, unspecified: Secondary | ICD-10-CM

## 2014-10-08 DIAGNOSIS — N401 Enlarged prostate with lower urinary tract symptoms: Secondary | ICD-10-CM | POA: Diagnosis present

## 2014-10-08 DIAGNOSIS — C169 Malignant neoplasm of stomach, unspecified: Secondary | ICD-10-CM | POA: Diagnosis present

## 2014-10-08 DIAGNOSIS — R509 Fever, unspecified: Secondary | ICD-10-CM | POA: Diagnosis present

## 2014-10-08 DIAGNOSIS — Z85238 Personal history of other malignant neoplasm of thymus: Secondary | ICD-10-CM

## 2014-10-08 DIAGNOSIS — I2510 Atherosclerotic heart disease of native coronary artery without angina pectoris: Secondary | ICD-10-CM | POA: Diagnosis present

## 2014-10-08 HISTORY — DX: Fracture of unspecified part of neck of left femur, initial encounter for closed fracture: S72.002A

## 2014-10-08 HISTORY — DX: Acute posthemorrhagic anemia: D62

## 2014-10-08 HISTORY — DX: Acute on chronic diastolic (congestive) heart failure: I50.33

## 2014-10-08 HISTORY — DX: Syncope and collapse: R55

## 2014-10-08 HISTORY — DX: Urinary tract infection, site not specified: N39.0

## 2014-10-08 HISTORY — DX: Other shock: R57.8

## 2014-10-08 HISTORY — DX: Unspecified Escherichia coli (E. coli) as the cause of diseases classified elsewhere: B96.20

## 2014-10-08 LAB — CBC WITH DIFFERENTIAL/PLATELET
BASOS ABS: 0.1 10*3/uL (ref 0.0–0.1)
Basophils Relative: 0 % (ref 0–1)
EOS ABS: 0 10*3/uL (ref 0.0–0.7)
EOS PCT: 0 % (ref 0–5)
HEMATOCRIT: 31.6 % — AB (ref 39.0–52.0)
HEMOGLOBIN: 9.9 g/dL — AB (ref 13.0–17.0)
Lymphocytes Relative: 7 % — ABNORMAL LOW (ref 12–46)
Lymphs Abs: 1.2 10*3/uL (ref 0.7–4.0)
MCH: 28.9 pg (ref 26.0–34.0)
MCHC: 31.3 g/dL (ref 30.0–36.0)
MCV: 92.1 fL (ref 78.0–100.0)
MONOS PCT: 10 % (ref 3–12)
Monocytes Absolute: 1.7 10*3/uL — ABNORMAL HIGH (ref 0.1–1.0)
NEUTROS ABS: 14.4 10*3/uL — AB (ref 1.7–7.7)
Neutrophils Relative %: 83 % — ABNORMAL HIGH (ref 43–77)
Platelets: 251 10*3/uL (ref 150–400)
RBC: 3.43 MIL/uL — AB (ref 4.22–5.81)
RDW: 16.3 % — ABNORMAL HIGH (ref 11.5–15.5)
WBC: 17.4 10*3/uL — ABNORMAL HIGH (ref 4.0–10.5)

## 2014-10-08 LAB — URINALYSIS, ROUTINE W REFLEX MICROSCOPIC
Bilirubin Urine: NEGATIVE
GLUCOSE, UA: NEGATIVE mg/dL
Ketones, ur: NEGATIVE mg/dL
Nitrite: POSITIVE — AB
PH: 5.5 (ref 5.0–8.0)
PROTEIN: 100 mg/dL — AB
SPECIFIC GRAVITY, URINE: 1.011 (ref 1.005–1.030)
UROBILINOGEN UA: 0.2 mg/dL (ref 0.0–1.0)

## 2014-10-08 LAB — COMPREHENSIVE METABOLIC PANEL
ALT: 21 U/L (ref 0–53)
ANION GAP: 8 (ref 5–15)
AST: 25 U/L (ref 0–37)
Albumin: 2.7 g/dL — ABNORMAL LOW (ref 3.5–5.2)
Alkaline Phosphatase: 95 U/L (ref 39–117)
BUN: 31 mg/dL — ABNORMAL HIGH (ref 6–23)
CO2: 27 mmol/L (ref 19–32)
CREATININE: 1.78 mg/dL — AB (ref 0.50–1.35)
Calcium: 8.3 mg/dL — ABNORMAL LOW (ref 8.4–10.5)
Chloride: 105 mmol/L (ref 96–112)
GFR, EST AFRICAN AMERICAN: 40 mL/min — AB (ref >90)
GFR, EST NON AFRICAN AMERICAN: 35 mL/min — AB (ref >90)
GLUCOSE: 127 mg/dL — AB (ref 70–99)
Potassium: 4.5 mmol/L (ref 3.5–5.1)
Sodium: 140 mmol/L (ref 135–145)
TOTAL PROTEIN: 5.4 g/dL — AB (ref 6.0–8.3)
Total Bilirubin: 0.8 mg/dL (ref 0.3–1.2)

## 2014-10-08 LAB — URINE MICROSCOPIC-ADD ON

## 2014-10-08 LAB — LIPASE, BLOOD: LIPASE: 19 U/L (ref 11–59)

## 2014-10-08 LAB — I-STAT CG4 LACTIC ACID, ED: LACTIC ACID, VENOUS: 0.78 mmol/L (ref 0.5–2.0)

## 2014-10-08 MED ORDER — HYDROCODONE-ACETAMINOPHEN 5-325 MG PO TABS: 1.0000 | ORAL_TABLET | Freq: Four times a day (QID) | ORAL | Status: DC | PRN

## 2014-10-08 MED ORDER — ONDANSETRON HCL 4 MG PO TABS: 4.0000 mg | ORAL_TABLET | Freq: Four times a day (QID) | ORAL | Status: DC | PRN

## 2014-10-08 MED ORDER — PRAVASTATIN SODIUM 80 MG PO TABS
80.0000 mg | ORAL_TABLET | Freq: Every day | ORAL | Status: DC
  Administered 2014-10-09: 80 mg via ORAL
  Filled 2014-10-08 (×3): qty 1

## 2014-10-08 MED ORDER — ALUM & MAG HYDROXIDE-SIMETH 200-200-20 MG/5ML PO SUSP: 30.0000 mL | Freq: Four times a day (QID) | ORAL | Status: DC | PRN

## 2014-10-08 MED ORDER — ONDANSETRON HCL 4 MG/2ML IJ SOLN
4.0000 mg | Freq: Once | INTRAMUSCULAR | Status: AC
  Administered 2014-10-08: 4 mg via INTRAVENOUS
  Filled 2014-10-08: qty 2

## 2014-10-08 MED ORDER — CEFTRIAXONE SODIUM IN DEXTROSE 20 MG/ML IV SOLN
1.0000 g | INTRAVENOUS | Status: DC
  Administered 2014-10-08 – 2014-10-09 (×2): 1 g via INTRAVENOUS
  Filled 2014-10-08 (×4): qty 50

## 2014-10-08 MED ORDER — ACETAMINOPHEN 650 MG RE SUPP: 650.0000 mg | Freq: Four times a day (QID) | RECTAL | Status: DC | PRN

## 2014-10-08 MED ORDER — AMLODIPINE BESYLATE 5 MG PO TABS
5.0000 mg | ORAL_TABLET | Freq: Every day | ORAL | Status: DC
  Administered 2014-10-09 – 2014-10-10 (×2): 5 mg via ORAL
  Filled 2014-10-08 (×3): qty 1

## 2014-10-08 MED ORDER — CITALOPRAM HYDROBROMIDE 20 MG PO TABS
20.0000 mg | ORAL_TABLET | Freq: Every day | ORAL | Status: DC
  Administered 2014-10-09 – 2014-10-10 (×2): 20 mg via ORAL
  Filled 2014-10-08 (×3): qty 1

## 2014-10-08 MED ORDER — ATENOLOL 25 MG PO TABS
25.0000 mg | ORAL_TABLET | Freq: Every day | ORAL | Status: DC
  Administered 2014-10-09 – 2014-10-10 (×2): 25 mg via ORAL
  Filled 2014-10-08 (×3): qty 1

## 2014-10-08 MED ORDER — SODIUM CHLORIDE 0.9 % IV SOLN
INTRAVENOUS | Status: AC
Start: 2014-10-08 — End: 2014-10-09
  Administered 2014-10-08: 18:00:00 via INTRAVENOUS

## 2014-10-08 MED ORDER — NITROGLYCERIN 0.4 MG SL SUBL: 0.4000 mg | SUBLINGUAL_TABLET | SUBLINGUAL | Status: DC | PRN

## 2014-10-08 MED ORDER — SODIUM CHLORIDE 0.9 % IV SOLN
1000.0000 mL | Freq: Once | INTRAVENOUS | Status: AC
  Administered 2014-10-08: 1000 mL via INTRAVENOUS

## 2014-10-08 MED ORDER — SODIUM CHLORIDE 0.9 % IV SOLN
1000.0000 mL | INTRAVENOUS | Status: DC
  Administered 2014-10-08: 1000 mL via INTRAVENOUS

## 2014-10-08 MED ORDER — DEXTROSE 5 % IV SOLN
1.0000 g | Freq: Once | INTRAVENOUS | Status: AC
  Administered 2014-10-08: 1 g via INTRAVENOUS
  Filled 2014-10-08: qty 10

## 2014-10-08 MED ORDER — ONDANSETRON HCL 4 MG/2ML IJ SOLN: 4.0000 mg | Freq: Four times a day (QID) | INTRAMUSCULAR | Status: DC | PRN

## 2014-10-08 MED ORDER — BOOST PLUS PO LIQD
237.0000 mL | Freq: Two times a day (BID) | ORAL | Status: DC | PRN
  Filled 2014-10-08: qty 237

## 2014-10-08 MED ORDER — ACETAMINOPHEN 325 MG PO TABS
650.0000 mg | ORAL_TABLET | Freq: Four times a day (QID) | ORAL | Status: DC | PRN
  Administered 2014-10-08 – 2014-10-09 (×2): 650 mg via ORAL
  Filled 2014-10-08 (×2): qty 2

## 2014-10-08 MED ORDER — IBRUTINIB 140 MG PO CAPS: 560.0000 mg | ORAL_CAPSULE | Freq: Every day | ORAL | Status: DC

## 2014-10-08 MED ORDER — HEPARIN SODIUM (PORCINE) 5000 UNIT/ML IJ SOLN
5000.0000 [IU] | Freq: Three times a day (TID) | INTRAMUSCULAR | Status: DC
  Administered 2014-10-08 – 2014-10-10 (×6): 5000 [IU] via SUBCUTANEOUS
  Filled 2014-10-08 (×9): qty 1

## 2014-10-08 MED ORDER — SODIUM CHLORIDE 0.9 % IV SOLN
INTRAVENOUS | Status: DC
  Administered 2014-10-09: 03:00:00 via INTRAVENOUS

## 2014-10-08 MED ORDER — PANTOPRAZOLE SODIUM 40 MG PO TBEC
40.0000 mg | DELAYED_RELEASE_TABLET | Freq: Two times a day (BID) | ORAL | Status: DC
  Administered 2014-10-08 – 2014-10-10 (×4): 40 mg via ORAL
  Filled 2014-10-08 (×5): qty 1

## 2014-10-08 MED ORDER — ISOSORBIDE MONONITRATE ER 60 MG PO TB24
90.0000 mg | ORAL_TABLET | Freq: Every day | ORAL | Status: DC
  Administered 2014-10-09 – 2014-10-10 (×2): 90 mg via ORAL
  Filled 2014-10-08 (×3): qty 1

## 2014-10-08 NOTE — ED Notes (Signed)
Attempt made to call report 3rd floor will f/u x21min

## 2014-10-08 NOTE — ED Notes (Signed)
Bed: WA21 Expected date: 10/08/14 Expected time: 12:53 PM Means of arrival: Ambulance Comments: Migraine

## 2014-10-08 NOTE — Progress Notes (Signed)
Inbrutinib (Imbruvica)   ANC < 450   Platelet count < 50,000  SCr > 1.5x baseline (or >2 if baseline unknown)   Bili > 1.5x ULD  AST or ALT >3 ULN  NYHA Class 3 or 4 Heart failure   Active  Infection   Hemorrhage  3-7 days pre- or post-surgery  Imbruvica has been held b/c pt was admitted with active infection (UTI) and therefore met Hold criteria. Please review pt information and restart when appropriate.   Thanks, Dorrene German 10/08/2014 5:44 PM

## 2014-10-08 NOTE — ED Provider Notes (Signed)
CSN: [id number]     Arrival date & time 10/08/14  1302 History   First MD Initiated Contact with Patient 10/08/14 1320     Chief Complaint  Patient presents with  . Headache  . Urinary Frequency   HPI Pt woke up this morning and he started vomiting. He has been having trouble with white opaque looking urine. He also developed pain in his bilateral flank area.  He has felt feverish and had a temp to 100.3  He vomited twice today.  No diarrhea.    He has been coughing.     He has had a bad headache for several months.  It has been waxing and waning.  He has had brain scans that did not show signs of tumor but he did have small strokes back in December.  That continues to persist and is bothering him now  No trouble with speech or coordination.  He has been confused at times. Past Medical History  Diagnosis Date  . Hypertension   . CAD 11/12/2007    a. s/p CABG;  b. Cath 7/11 Patent LIMA to the LAD. Previously known occlusion of a saphenous vein graft to diagonal and sequential to obtuse marginals. Severe diffuse native obtuse marginal and diagonal branch vessel disease. Preserved ejection fraction.;  c. 03/2013 Lexi CL: mild inflat and inf ischemia, EF 54%->initial med rx.  Marland Kitchen HYPERLIPIDEMIA 11/12/2007  . HYPERTENSION 11/12/2007  . PERIPHERAL VASCULAR DISEASE 11/12/2007  . ARTHRITIS 11/12/2007  . BENIGN PROSTATIC HYPERTROPHY, HX OF 11/12/2007  . Edema 10/17/2010  . INGUINAL HERNIAS, BILATERAL 11/10/2006  . Obesity, unspecified 07/05/2009  . SLEEP APNEA 11/12/2007  . History of PTCA 2005  . Osteoarthritis   . Carotid stenosis, bilateral   . Nephrolithiasis   . thymus ca dx'd 2003    Radiation comp 2003  . NHL (non-Hodgkin's lymphoma) dx'd 2008    Chemo comp 10/2010; rituxin comp 10/2010  . 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 MANTLE CELL LYMPHOMA INTRA-ABDOMINAL LYMPH NODES 11/12/2007  . Tricuspid valve regurgitation   . Prerenal renal failure 09/20/2014   Past Surgical History  Procedure Laterality  Date  . Coronary artery bypass graft    . Cardiac catheterization  08/2009, 02/2010  . Total hip arthroplasty Left 12/09/2013    Procedure: TOTAL HIP ARTHROPLASTY;  Surgeon: Mauri Pole, MD;  Location: [facility];  Service: Orthopedics;  Laterality: Left;  . Esophagogastroduodenoscopy N/A 08/02/2014    Procedure: ESOPHAGOGASTRODUODENOSCOPY (EGD) at bedside;  Surgeon: Irene Shipper, MD;  Location: [facility];  Service: Endoscopy;  Laterality: N/A;  . Esophagogastroduodenoscopy (egd) with propofol N/A 08/10/2014    Procedure: ESOPHAGOGASTRODUODENOSCOPY (EGD) WITH PROPOFOL;  Surgeon: Milus Banister, MD;  Location: [facility];  Service: Endoscopy;  Laterality: N/A;   Family History  Problem Relation Age of Onset  . Coronary artery disease Mother     died at 41  . Heart attack Sister     died at age 73  . Heart attack Brother    History  Substance Use Topics  . Smoking status: Never Smoker   . Smokeless tobacco: Never Used  . Alcohol Use: No    Review of Systems  All other systems reviewed and are negative.     Allergies  Atorvastatin  Home Medications   Prior to Admission medications   Medication Sig Start Date End Date Taking? Authorizing Provider  amLODipine (NORVASC) 5 MG tablet Take 1 tablet (5 mg total) by mouth daily. 01/12/14  Yes Minus Breeding, MD  atenolol (TENORMIN) 25 MG tablet Take 25 mg by mouth daily.   Yes Historical Provider, MD  citalopram (CELEXA) 20 MG tablet TAKE ONE TABLET BY MOUTH ONCE DAILY 08/12/14  Yes Christina P Rama, MD  furosemide (LASIX) 40 MG tablet Take 40 mg by mouth daily. Pt takes 1/2 tablet (20 mg )   Yes Historical Provider, MD  HYDROcodone-acetaminophen (NORCO/VICODIN) 5-325 MG per tablet Take 1-2 tablets by mouth every 6 (six) hours as needed for moderate pain. Patient taking differently: Take 1-2 tablets by mouth as needed for moderate pain.  12/10/13  Yes Lucille Passy Babish, PA-C  ibrutinib (IMBRUVICA) 140 MG capsul Take 4 capsules  (560 mg total) by mouth daily. 08/23/14  Yes Heath Lark, MD  isosorbide mononitrate (IMDUR) 30 MG 24 hr tablet Take 3 tablets (90 mg total) by mouth daily. 10/07/14  Yes Minus Breeding, MD  lactose free nutrition (BOOST PLUS) LIQD Take 237 mLs by mouth 2 (two) times daily as needed (Allow w/pt or pt's wife request after diet advancement). 12/13/13  Yes Annita Brod, MD  nitroGLYCERIN (NITROSTAT) 0.4 MG SL tablet Place 1 tablet (0.4 mg total) under the tongue every 5 (five) minutes as needed for chest pain. 01/12/14  Yes Minus Breeding, MD  pantoprazole (PROTONIX) 40 MG tablet Take 1 tablet (40 mg total) by mouth 2 (two) times daily. 08/12/14  Yes Venetia Maxon Rama, MD  pravastatin (PRAVACHOL) 80 MG tablet Take 1 tablet (80 mg total) by mouth daily. 01/12/14  Yes Minus Breeding, MD  aspirin EC 81 MG tablet Take 1 tablet (81 mg total) by mouth daily. Patient not taking: Reported on 10/08/2014 09/15/14   Minus Breeding, MD  methocarbamol (ROBAXIN) 500 MG tablet Take 1 tablet (500 mg total) by mouth every 6 (six) hours as needed for muscle spasms. Patient not taking: Reported on 10/08/2014 12/10/13   Lucille Passy Babish, PA-C   BP 100/53 mmHg  Pulse 63  Temp(Src) 98.2 F (36.8 C) (Oral)  Resp 18  SpO2 99% Physical Exam  Constitutional: No distress.  HENT:  Head: Normocephalic and atraumatic.  Right Ear: External ear normal.  Left Ear: External ear normal.  Mouth/Throat: No oropharyngeal exudate.  Eyes: Conjunctivae are normal. Right eye exhibits no discharge. Left eye exhibits no discharge. No scleral icterus.  Neck: Neck supple. No tracheal deviation present.  Cardiovascular: Normal rate, regular rhythm and intact distal pulses.   Pulmonary/Chest: Effort normal. No stridor. No respiratory distress. He has no wheezes. He has rales (bilateral bases).  Abdominal: Soft. Bowel sounds are normal. He exhibits no distension. There is no tenderness. There is no rebound and no guarding.   Musculoskeletal: He exhibits edema (edema bilateral lower extremities). He exhibits no tenderness.  Neurological: He is alert. He has normal strength. No cranial nerve deficit (no facial droop, extraocular movements intact, no slurred speech) or sensory deficit. He exhibits normal muscle tone. He displays no seizure activity. Coordination normal.  Patient is able to lift both arms and both legs off the bed, no pronator drift, he is able to sit up with some assistance, normal sensations  Skin: Skin is warm. No rash noted. He is diaphoretic.  Psychiatric: He has a normal mood and affect.  Nursing note and vitals reviewed.   ED Course  Procedures (including critical care time) Labs Review Labs Reviewed  CBC WITH DIFFERENTIAL/PLATELET - Abnormal; Notable for the following:    WBC 17.4 (*)    RBC 3.43 (*)    Hemoglobin 9.9 (*)  HCT 31.6 (*)    RDW 16.3 (*)    Neutrophils Relative % 83 (*)    Neutro Abs 14.4 (*)    Lymphocytes Relative 7 (*)    Monocytes Absolute 1.7 (*)    All other components within normal limits  COMPREHENSIVE METABOLIC PANEL - Abnormal; Notable for the following:    Glucose, Bld 127 (*)    BUN 31 (*)    Creatinine, Ser 1.78 (*)    Calcium 8.3 (*)    Total Protein 5.4 (*)    Albumin 2.7 (*)    GFR calc non Af Amer 35 (*)    GFR calc Af Amer 40 (*)    All other components within normal limits  URINALYSIS, ROUTINE W REFLEX MICROSCOPIC - Abnormal; Notable for the following:    APPearance TURBID (*)    Hgb urine dipstick MODERATE (*)    Protein, ur 100 (*)    Nitrite POSITIVE (*)    Leukocytes, UA LARGE (*)    All other components within normal limits  URINE MICROSCOPIC-ADD ON - Abnormal; Notable for the following:    Bacteria, UA MANY (*)    All other components within normal limits  URINE CULTURE  LIPASE, BLOOD  I-STAT CG4 LACTIC ACID, ED    Imaging Review Dg Chest 2 View  10/08/2014   CLINICAL DATA:  Confusion, history of nausea and headache.  EXAM:  CHEST  2 VIEW  COMPARISON:  Chest radiograph 08/29/2014  FINDINGS: Right anterior chest wall Port-A-Cath is present. Stable enlarged cardiac and mediastinal contours status post median sternotomy and CABG procedure. Persistent elevation right hemidiaphragm. No large consolidative pulmonary opacities. No pleural effusion or pneumothorax. Mid thoracic spine degenerative changes.  IMPRESSION: Cardiomegaly.  No acute cardiopulmonary process.   Electronically Signed   By: Lovey Newcomer M.D.   On: 10/08/2014 15:04   Ct Head Wo Contrast  10/08/2014   CLINICAL DATA:  Headache, urinary frequency.  Nausea.  EXAM: CT HEAD WITHOUT CONTRAST  TECHNIQUE: Contiguous axial images were obtained from the base of the skull through the vertex without intravenous contrast.  COMPARISON:  08/02/2014  FINDINGS: Ventricles and sulci are prominent, compatible with atrophy. Periventricular and subcortical white matter hypodensity compatible with chronic small vessel ischemic change. Remote infarcts demonstrated within the right frontal lobe, right parietal lobe, right cerebellar hemisphere and right occipital lobe. No evidence for acute cortically based infarct, intracranial hemorrhage, mass lesion or mass effect. Orbits are unremarkable. Paranasal sinuses are unremarkable. Mastoid air cells are well aerated. Calvarium is intact.  IMPRESSION: Diffuse atrophy and chronic small vessel ischemic change.  No acute intracranial process.   Electronically Signed   By: Lovey Newcomer M.D.   On: 10/08/2014 15:12   Dg Abd 2 Views  10/08/2014   CLINICAL DATA:  Acute nausea, vomiting and abdominal pain. Initial encounter.  EXAM: ABDOMEN - 2 VIEW  COMPARISON:  07/27/2014 CT and prior studies  FINDINGS: The bowel gas pattern is unremarkable a small to moderate amount of stool in the transverse colon is.  There is no evidence of bowel obstruction or pneumoperitoneum.  One of the gastric endoclips is now noted overlying the lower left abdomen, presumably in  bowel.  IMPRESSION: Unremarkable bowel gas pattern.  No evidence of pneumoperitoneum.  One gastric endoclip overlying the lower left abdomen, presumably in bowel.   Electronically Signed   By: Margarette Canada M.D.   On: 10/08/2014 15:14    Medications  0.9 %  sodium chloride infusion (0  mLs Intravenous Stopped 10/08/14 1521)    Followed by  0.9 %  sodium chloride infusion (1,000 mLs Intravenous New Bag/Given 10/08/14 1520)  ondansetron (ZOFRAN) injection 4 mg (4 mg Intravenous Given 10/08/14 1521)  cefTRIAXone (ROCEPHIN) 1 g in dextrose 5 % 50 mL IVPB (1 g Intravenous New Bag/Given 10/08/14 1536)     MDM   Final diagnoses:  Vomiting  UTI (lower urinary tract infection)   Pt has a uti now with vomiting and some confusion per family.  Pt was alert and oriented during my exam.  Non toxic but listless.  IV abx started.  Admit for further treatment and monitoring.     Dorie Rank, MD 10/08/14 [phone]

## 2014-10-08 NOTE — ED Notes (Signed)
1x unsucessful blood draw, Matt RN attempting to get using ultrasound

## 2014-10-08 NOTE — ED Notes (Addendum)
Per EMS, pt's wife called EMS due to pt having headache, nausea since yesterday and possible UTI. Pt was seen at his doctor's office this week for possible UTI, pt has not received results of urinalysis. Pt has hx of heart failure, kidney failure, and lymphoma.

## 2014-10-08 NOTE — H&P (Signed)
History and Physical:    Martin Morris UKG:[id number] DOB: 10-Jul-1933 DOA: 10/08/2014  Referring physician: Dr. Dorie Rank PCP: [name], MD   Chief Complaint: Discolored urine, nausea/vomiting, fever, flank pain  History of Present Illness:   Martin Morris is an 81 y.o. male with a PMH of HTN, CAD, hyperlipidemia, PVD, BPH, OSA, NHL status post chemo/XRT who presented to [facility] ED with a chief complaint of nausea, vomiting, discolored urine, fever, bilateral flank pain.  He saw his nephrologist 10/07/14, Dr. Justin Mend, who drew blood and took a urine specimen but did not make any changes to his medications although he mentioned that he "might" have a UTI.  Dr. Justin Mend also was going to refer him to a urologist for his urinary incontinence and to a neurologist for his longstanding headaches.  The patient has had 2 recent hospitalizations.  The first was  08-22-15-08/12/14 where he was treated for hemorrhagic shock (required 4 units of PRBCs and a unit of platelets) secondary to a gastric ulcer hemorrhage requiring embolization of the right gastroepiploic artery.  Pathology of the ulcer showed mantle cell lymphoma, and he is now on treatment with Ibrutinib for this.  Because of the significance of his GI bleeding, ASA and Plavix were discontinued.    The second hospitalization was 08/28/14-09/02/14 where he was treated for acute on chronic CHF and NSTEMI (dry weight approximately 190 lbs) requiring diuresis.  He was not felt to be a candidate for anticoagulation during that hospital stay.  He now returns with the above noted symptoms in the setting of an obvious failure to thrive, with multiple co-morbidities and complications.  Despite this, his wife tells me that he would like to remain a full code.    On initial evaluation, the patient is noted to have a WBC of 17.4 with a left shift, a hemoglobin of 9.9, and a creatinine of 1.78 (baseline around 1.5).  T 98.2, BP 100/53, P 63,  respirations 18 with an oxygen saturation of 93-98%.  Lactic acid is 0.78.  Urinalysis shows +nitrites, and TNTC WBC with many bacteria.  ROS:   Constitutional: + fever, + chills;  Appetite diminished; + weight loss, no weight gain, + fatigue.  HEENT: No blurry vision, no diplopia, + pharyngitis, + dysphagia CV: No chest pain, no palpitations, no PND, no orthopnea, no edema.  Resp: + SOB, no cough, no pleuritic pain. GI: + nausea, + vomiting, + recent diarrhea, no melena, no hematochezia, no constipation, no abdominal pain.  GU: No dysuria, no hematuria, no frequency, no urgency, + flank pain. MSK: no myalgias, no arthralgias.  Neuro:  + headache, no focal neurological deficits, no history of seizures.  Psych: No depression, no anxiety.  Endo: No heat intolerance, no cold intolerance, no polyuria, no polydipsia  Skin: + recent rash, no skin lesions.  Heme: + easy bruising.  Travel history: No recent travel.   Past Medical History:   Past Medical History  Diagnosis Date  . Hypertension   . CAD 11/12/2007    a. s/p CABG;  b. Cath 7/11 Patent LIMA to the LAD. Previously known occlusion of a saphenous vein graft to diagonal and sequential to obtuse marginals. Severe diffuse native obtuse marginal and diagonal branch vessel disease. Preserved ejection fraction.;  c. 03/2013 Lexi CL: mild inflat and inf ischemia, EF 54%->initial med rx.  Marland Kitchen HYPERLIPIDEMIA 11/12/2007  . HYPERTENSION 11/12/2007  . PERIPHERAL VASCULAR DISEASE 11/12/2007  . ARTHRITIS 11/12/2007  . BENIGN PROSTATIC HYPERTROPHY, HX  OF 11/12/2007  . Edema 10/17/2010  . INGUINAL HERNIAS, BILATERAL 11/10/2006  . Obesity, unspecified 07/05/2009  . SLEEP APNEA 11/12/2007  . History of PTCA 2005  . Osteoarthritis   . Carotid stenosis, bilateral   . Nephrolithiasis   . thymus ca dx'd 2003    Radiation comp 2003  . NHL (non-Hodgkin's lymphoma) dx'd 2008    Chemo comp 10/2010; rituxin comp 10/2010  . 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 MANTLE CELL LYMPHOMA  INTRA-ABDOMINAL LYMPH NODES 11/12/2007  . Tricuspid valve regurgitation   . Prerenal renal failure 09/20/2014  . Acute on chronic diastolic CHF (congestive heart failure) 08/28/2014  . E. coli UTI 08/28/2014  . Fracture of femoral neck, left 12/09/2013  . Acute blood loss anemia 12/10/2013  . Hemorrhagic shock 08/05/2014  . Syncope 08/05/2014    Past Surgical History:   Past Surgical History  Procedure Laterality Date  . Coronary artery bypass graft    . Cardiac catheterization  08/2009, 02/2010  . Total hip arthroplasty Left 12/09/2013    Procedure: TOTAL HIP ARTHROPLASTY;  Surgeon: Mauri Pole, MD;  Location: [facility]S;  Service: Orthopedics;  Laterality: Left;  . Esophagogastroduodenoscopy N/A 08/02/2014    Procedure: ESOPHAGOGASTRODUODENOSCOPY (EGD) at bedside;  Surgeon: Irene Shipper, MD;  Location: [facility];  Service: Endoscopy;  Laterality: N/A;  . Esophagogastroduodenoscopy (egd) with propofol N/A 08/10/2014    Procedure: ESOPHAGOGASTRODUODENOSCOPY (EGD) WITH PROPOFOL;  Surgeon: Milus Banister, MD;  Location: [facility];  Service: Endoscopy;  Laterality: N/A;    Social History:   History   Social History  . Marital Status: Married    Spouse Name: Callan Norden  . Number of Children: 2  . Years of Education: N/A   Occupational History  . Retired Warehouse manager    Social History Main Topics  . Smoking status: Never Smoker   . Smokeless tobacco: Never Used  . Alcohol Use: No  . Drug Use: No  . Sexual Activity: Not on file   Other Topics Concern  . Not on file   Social History Narrative   The patient lives in [place] with his wife. He is a retired Engineer, agricultural. No tobacco or alcohol use.  Ambulates with a walker.      Family history:   Family History  Problem Relation Age of Onset  . Coronary artery disease Father     died at 29  . Heart attack Mother     died at age 66  . Heart attack Brother     Allergies   Atorvastatin  Current Medications:   Prior to  Admission medications   Medication Sig Start Date End Date Taking? Authorizing Provider  amLODipine (NORVASC) 5 MG tablet Take 1 tablet (5 mg total) by mouth daily. 01/12/14  Yes Minus Breeding, MD  atenolol (TENORMIN) 25 MG tablet Take 25 mg by mouth daily.   Yes Historical Provider, MD  citalopram (CELEXA) 20 MG tablet TAKE ONE TABLET BY MOUTH ONCE DAILY 08/12/14  Yes Nickia Boesen P Wassim Kirksey, MD  furosemide (LASIX) 40 MG tablet Take 40 mg by mouth daily. Pt takes 1/2 tablet (20 mg )   Yes Historical Provider, MD  HYDROcodone-acetaminophen (NORCO/VICODIN) 5-325 MG per tablet Take 1-2 tablets by mouth every 6 (six) hours as needed for moderate pain. Patient taking differently: Take 1-2 tablets by mouth as needed for moderate pain.  12/10/13  Yes Lucille Passy Babish, PA-C  ibrutinib (IMBRUVICA) 140 MG capsul Take 4 capsules (560 mg total) by mouth daily. 08/23/14  Yes Heath Lark, MD  isosorbide mononitrate (IMDUR) 30 MG 24 hr tablet Take 3 tablets (90 mg total) by mouth daily. 10/07/14  Yes Minus Breeding, MD  lactose free nutrition (BOOST PLUS) LIQD Take 237 mLs by mouth 2 (two) times daily as needed (Allow w/pt or pt's wife request after diet advancement). 12/13/13  Yes Annita Brod, MD  nitroGLYCERIN (NITROSTAT) 0.4 MG SL tablet Place 1 tablet (0.4 mg total) under the tongue every 5 (five) minutes as needed for chest pain. 01/12/14  Yes Minus Breeding, MD  pantoprazole (PROTONIX) 40 MG tablet Take 1 tablet (40 mg total) by mouth 2 (two) times daily. 08/12/14  Yes Venetia Maxon Chandy Tarman, MD  pravastatin (PRAVACHOL) 80 MG tablet Take 1 tablet (80 mg total) by mouth daily. 01/12/14  Yes Minus Breeding, MD  aspirin EC 81 MG tablet Take 1 tablet (81 mg total) by mouth daily. Patient not taking: Reported on 10/08/2014 09/15/14   Minus Breeding, MD  methocarbamol (ROBAXIN) 500 MG tablet Take 1 tablet (500 mg total) by mouth every 6 (six) hours as needed for muscle spasms. Patient not taking: Reported on 10/08/2014  12/10/13   Pricilla Loveless, PA-C    Physical Exam:   Filed Vitals:   10/08/14 1322 10/08/14 1520  BP: 113/52 100/53  Pulse: 79 63  Temp: 98.2 F (36.8 C)   TempSrc: Oral   Resp: 16 18  SpO2: 93% 99%     Physical Exam: Blood pressure 100/53, pulse 63, temperature 98.2 F (36.8 C), temperature source Oral, resp. rate 18, SpO2 99 %. Gen: No acute distress.  Lethargic Head: Normocephalic, atraumatic. Eyes: PERRL, EOMI, sclerae nonicteric. Mouth: Oropharynx with dry mucous membranes.  No posterior pharyngeal exudates. Neck: Supple, no thyromegaly, no lymphadenopathy, no jugular venous distention. Chest: Lungs CTAB. CV: Heart sounds are regular.  No M/R/G. Abdomen: Soft, nontender, nondistended with normal active bowel sounds. Extremities: Extremities show asymmetric edema, much greater on right. Skin: Warm and dry.  Petechie BLE, bruising. Neuro: Lethargic; cranial nerves II through XII grossly intact. Psych: Mood and affect normal.   Data Review:    Labs: Basic Metabolic Panel:  Recent Labs Lab 10/08/14 1431  NA 140  K 4.5  CL 105  CO2 27  GLUCOSE 127*  BUN 31*  CREATININE 1.78*  CALCIUM 8.3*   Liver Function Tests:  Recent Labs Lab 10/08/14 1431  AST 25  ALT 21  ALKPHOS 95  BILITOT 0.8  PROT 5.4*  ALBUMIN 2.7*    Recent Labs Lab 10/08/14 1431  LIPASE 19   CBC:  Recent Labs Lab 10/08/14 1431  WBC 17.4*  NEUTROABS 14.4*  HGB 9.9*  HCT 31.6*  MCV 92.1  PLT 251    BNP (last 3 results)  Recent Labs  12/09/13 0130  PROBNP 630.1*    Radiographic Studies: Dg Chest 2 View  10/08/2014   CLINICAL DATA:  Confusion, history of nausea and headache.  EXAM: CHEST  2 VIEW  COMPARISON:  Chest radiograph 08/29/2014  FINDINGS: Right anterior chest wall Port-A-Cath is present. Stable enlarged cardiac and mediastinal contours status post median sternotomy and CABG procedure. Persistent elevation right hemidiaphragm. No large consolidative  pulmonary opacities. No pleural effusion or pneumothorax. Mid thoracic spine degenerative changes.  IMPRESSION: Cardiomegaly.  No acute cardiopulmonary process.   Electronically Signed   By: Lovey Newcomer M.D.   On: 10/08/2014 15:04   Ct Head Wo Contrast  10/08/2014   CLINICAL DATA:  Headache, urinary frequency.  Nausea.  EXAM:  CT HEAD WITHOUT CONTRAST  TECHNIQUE: Contiguous axial images were obtained from the base of the skull through the vertex without intravenous contrast.  COMPARISON:  08/02/2014  FINDINGS: Ventricles and sulci are prominent, compatible with atrophy. Periventricular and subcortical white matter hypodensity compatible with chronic small vessel ischemic change. Remote infarcts demonstrated within the right frontal lobe, right parietal lobe, right cerebellar hemisphere and right occipital lobe. No evidence for acute cortically based infarct, intracranial hemorrhage, mass lesion or mass effect. Orbits are unremarkable. Paranasal sinuses are unremarkable. Mastoid air cells are well aerated. Calvarium is intact.  IMPRESSION: Diffuse atrophy and chronic small vessel ischemic change.  No acute intracranial process.   Electronically Signed   By: Lovey Newcomer M.D.   On: 10/08/2014 15:12   Dg Abd 2 Views  10/08/2014   CLINICAL DATA:  Acute nausea, vomiting and abdominal pain. Initial encounter.  EXAM: ABDOMEN - 2 VIEW  COMPARISON:  07/27/2014 CT and prior studies  FINDINGS: The bowel gas pattern is unremarkable a small to moderate amount of stool in the transverse colon is.  There is no evidence of bowel obstruction or pneumoperitoneum.  One of the gastric endoclips is now noted overlying the lower left abdomen, presumably in bowel.  IMPRESSION: Unremarkable bowel gas pattern.  No evidence of pneumoperitoneum.  One gastric endoclip overlying the lower left abdomen, presumably in bowel.   Electronically Signed   By: Margarette Canada M.D.   On: 10/08/2014 15:14     Assessment/Plan:   Principal Problem:    UTI (lower urinary tract infection) with fever and leukocytosis  Rocephin started.  F/U urine culture.  Narrow antibiotics based on culture/sensitivities.  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  Notify Dr. Alvy Bimler of patient's admission (via Epic).    Essential hypertension  Continue Imdur/Norvasc/Atenolol as BP tolerates.  Hold Lasix.    Coronary atherosclerosis  NTG PRN.  Continue Imdur.  Avoid aspirin secondary to recent history of GI hemorrhage.    Edema  Chronic RLE edema since CABG.  Monitor.    Obesity (BMI 30-39.9)  Monitor nutritional status.    Anemia in neoplastic disease  Hemoglobin stable with no current indication for transfusion.    Chronic kidney disease (CKD), stage II (mild)  Creatinine slightly elevated over usual baseline value of 1.5.  Gently hydrate x 12 hours.    Malignant gastric ulcer: proximal per EGD 08/02/14  Continue BID PPI.    Hyperlipidemia  Continue Pravachol.    Headache  CT head unremarkable.  DVT prophylaxis  SQ heparin ordered.  Code Status: Full.  Discussed with wife who confirms this. Family Communication: Pam [phone] (cell), wife. Disposition Plan: Home when stable.  Time spent: 70 minutes.  Imajean Mcdermid [facility] Pager [phone] Cell: [phone]   If 7PM-7AM, please contact night-coverage [email or web address] Password [facility] 10/08/2014, 5:01 PM

## 2014-10-09 DIAGNOSIS — D72829 Elevated white blood cell count, unspecified: Secondary | ICD-10-CM

## 2014-10-09 DIAGNOSIS — I10 Essential (primary) hypertension: Secondary | ICD-10-CM

## 2014-10-09 LAB — BASIC METABOLIC PANEL
ANION GAP: 7 (ref 5–15)
BUN: 30 mg/dL — ABNORMAL HIGH (ref 6–23)
CALCIUM: 8.3 mg/dL — AB (ref 8.4–10.5)
CO2: 27 mmol/L (ref 19–32)
CREATININE: 1.52 mg/dL — AB (ref 0.50–1.35)
Chloride: 109 mmol/L (ref 96–112)
GFR calc Af Amer: 48 mL/min — ABNORMAL LOW (ref >90)
GFR calc non Af Amer: 42 mL/min — ABNORMAL LOW (ref >90)
Glucose, Bld: 107 mg/dL — ABNORMAL HIGH (ref 70–99)
Potassium: 4.2 mmol/L (ref 3.5–5.1)
Sodium: 143 mmol/L (ref 135–145)

## 2014-10-09 LAB — CBC
HCT: 29.9 % — ABNORMAL LOW (ref 39.0–52.0)
Hemoglobin: 9.4 g/dL — ABNORMAL LOW (ref 13.0–17.0)
MCH: 29.4 pg (ref 26.0–34.0)
MCHC: 31.4 g/dL (ref 30.0–36.0)
MCV: 93.4 fL (ref 78.0–100.0)
Platelets: 229 10*3/uL (ref 150–400)
RBC: 3.2 MIL/uL — ABNORMAL LOW (ref 4.22–5.81)
RDW: 16.6 % — AB (ref 11.5–15.5)
WBC: 16.9 10*3/uL — ABNORMAL HIGH (ref 4.0–10.5)

## 2014-10-09 NOTE — Progress Notes (Signed)
Progress Note   [name] GXQ:[id number] DOB: [birth year] DOA: 10/08/2014 PCP: [name], MD   Brief Narrative:   Martin Morris is an 81 y.o. male with a PMH of HTN, CAD, hyperlipidemia, PVD, BPH, OSA, NHL status post chemo/XRT who presented to [facility] ED 10/08/14 with a chief complaint of nausea, vomiting, discolored urine, fever, bilateral flank pain.   The patient has had 2 recent hospitalizations. The first was 2015/08/17-12/18/15 where he was treated for hemorrhagic shock (required 4 units of PRBCs and a unit of platelets) secondary to a gastric ulcer hemorrhage requiring embolization of the right gastroepiploic artery. Pathology of the ulcer showed mantle cell lymphoma, and he is now on treatment with Ibrutinib for this. Because of the significance of his GI bleeding, ASA and Plavix were discontinued.   The second hospitalization was 08/28/14-09/02/14 where he was treated for acute on chronic CHF and NSTEMI (dry weight approximately 190 lbs) requiring diuresis. He was not felt to be a candidate for anticoagulation during that hospital stay.  On initial evaluation, the patient was noted to have a WBC of 17.4 with a left shift, a hemoglobin of 9.9, and a creatinine of 1.78 (baseline around 1.5). T 98.2, BP 100/53, P 63, respirations 18 with an oxygen saturation of 93-98%. Lactic acid is 0.78. Urinalysis shows +nitrites, and TNTC WBC with many bacteria.   Assessment/Plan:   Principal Problem:  UTI (lower urinary tract infection) with fever and leukocytosis  Continue Rocephin.  F/U urine culture.  Narrow antibiotics based on culture/sensitivities.  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  Dr. Alvy Bimler notified of patient's admission (via Epic).   Essential hypertension  Continue Imdur/Norvasc/Atenolol as BP tolerates.  Lasix on hold.   Coronary atherosclerosis  NTG  PRN. Continue Imdur.  Avoid aspirin secondary to recent history of GI hemorrhage.   Edema  Chronic RLE edema since CABG. Monitor.   Obesity (BMI 30-39.9)  Monitor nutritional status.   Anemia in neoplastic disease  Hemoglobin stable with no current indication for transfusion.   Chronic kidney disease (CKD), stage II (mild)  Creatinine now back to usual baseline value of 1.5.  Gently hydrated x 12 hours.   Malignant gastric ulcer: proximal per EGD 08/02/14  Continue BID PPI.   Hyperlipidemia  Continue Pravachol.   Headache  CT head unremarkable.  DVT prophylaxis  SQ heparin ordered.  Code Status: Full. Discussed with wife who confirms this. Family Communication: Pam [phone] (cell), wife. Disposition Plan: Home when stable and urine cultures back.  IV Access:    Porta-Cath   Procedures and diagnostic studies:   Dg Chest 2 View 10/08/2014: Cardiomegaly.  No acute cardiopulmonary process.    Ct Head Wo Contrast 10/08/2014: Diffuse atrophy and chronic small vessel ischemic change.  No acute intracranial process.    Dg Abd 2 Views 10/08/2014: Unremarkable bowel gas pattern.  No evidence of pneumoperitoneum.  One gastric endoclip overlying the lower left abdomen, presumably in bowel.     Medical Consultants:    None.  Anti-Infectives:    Rocephin 10/08/14--->  Subjective:   [name] denies chest pain, dyspnea, cough.  Denies pain.  Denies headache.  Denies nausea or vomiting.  Says his appetite is "fine".  Last BM was yesterday.  Wants to go home.  Objective:    Filed Vitals:   10/08/14 1725 10/08/14 1900 10/08/14 1921 10/09/14 0300  BP: 101/39   137/55  Pulse: 63   72  Temp: 98.3 F (36.8 C)   99.1 F (37.3 C)  TempSrc: Axillary   Axillary  Resp: 18   18  Height: 5\' 9"  (1.753 m)     Weight: 82.2 kg (181 lb 3.5 oz)     SpO2: 99% 99% 100% 95%    Intake/Output Summary (Last 24 hours) at 10/09/14 0716 Last data filed at  10/09/14 0457  Gross per 24 hour  Intake    100 ml  Output    600 ml  Net   -500 ml    Exam: Gen:  NAD Psych: Angry affect Cardiovascular:  RRR, No M/R/G Respiratory:  Lungs CTAB Gastrointestinal:  Abdomen soft, NT/ND, + BS Extremities:  RLE edema (chronic)   Data Reviewed:    Labs: Basic Metabolic Panel:  Recent Labs Lab 10/08/14 1431 10/09/14 0505  NA 140 143  K 4.5 4.2  CL 105 109  CO2 27 27  GLUCOSE 127* 107*  BUN 31* 30*  CREATININE 1.78* 1.52*  CALCIUM 8.3* 8.3*   GFR Estimated Creatinine Clearance: 39.4 mL/min (by C-G formula based on Cr of 1.52). Liver Function Tests:  Recent Labs Lab 10/08/14 1431  AST 25  ALT 21  ALKPHOS 95  BILITOT 0.8  PROT 5.4*  ALBUMIN 2.7*    Recent Labs Lab 10/08/14 1431  LIPASE 19   CBC:  Recent Labs Lab 10/08/14 1431 10/09/14 0505  WBC 17.4* 16.9*  NEUTROABS 14.4*  --   HGB 9.9* 9.4*  HCT 31.6* 29.9*  MCV 92.1 93.4  PLT 251 229   BNP (last 3 results)  Recent Labs  12/09/13 0130  PROBNP 630.1*   Sepsis Labs:  Recent Labs Lab 10/08/14 1431 10/08/14 1646 10/09/14 0505  WBC 17.4*  --  16.9*  LATICACIDVEN  --  0.78  --    Microbiology No results found for this or any previous visit (from the past 240 hour(s)).   Medications:   . amLODipine  5 mg Oral Daily  . atenolol  25 mg Oral Daily  . cefTRIAXone (ROCEPHIN)  IV  1 g Intravenous Q24H  . citalopram  20 mg Oral Daily  . heparin  5,000 Units Subcutaneous 3 times per day  . isosorbide mononitrate  90 mg Oral Daily  . pantoprazole  40 mg Oral BID  . pravastatin  80 mg Oral Daily   Continuous Infusions:   Time spent: 35 minutes with > 50% of time discussing current diagnostic test results, clinical impression and plan of care.    LOS: 1 day   RAMA,CHRISTINA  [facility] Pager [phone]. If unable to reach me by pager, please call my cell phone at [phone].  *Please refer to [email or web address], password [email or web address] to get updated schedule on  who will round on this patient, as hospitalists switch teams weekly. If 7PM-7AM, please contact night-coverage at [email or web address], password [email or web address] for any overnight needs.  10/09/2014, 7:16 AM

## 2014-10-10 ENCOUNTER — Other Ambulatory Visit: Payer: Self-pay | Admitting: Nurse Practitioner

## 2014-10-10 DIAGNOSIS — E669 Obesity, unspecified: Secondary | ICD-10-CM

## 2014-10-10 DIAGNOSIS — E440 Moderate protein-calorie malnutrition: Secondary | ICD-10-CM

## 2014-10-10 LAB — BASIC METABOLIC PANEL
ANION GAP: 7 (ref 5–15)
BUN: 28 mg/dL — ABNORMAL HIGH (ref 6–23)
CALCIUM: 7.6 mg/dL — AB (ref 8.4–10.5)
CHLORIDE: 101 mmol/L (ref 96–112)
CO2: 26 mmol/L (ref 19–32)
CREATININE: 1.6 mg/dL — AB (ref 0.50–1.35)
GFR calc Af Amer: 46 mL/min — ABNORMAL LOW (ref >90)
GFR, EST NON AFRICAN AMERICAN: 39 mL/min — AB (ref >90)
Glucose, Bld: 97 mg/dL (ref 70–99)
POTASSIUM: 3.5 mmol/L (ref 3.5–5.1)
Sodium: 134 mmol/L — ABNORMAL LOW (ref 135–145)

## 2014-10-10 LAB — CBC
HCT: 27.8 % — ABNORMAL LOW (ref 39.0–52.0)
HEMOGLOBIN: 8.7 g/dL — AB (ref 13.0–17.0)
MCH: 29.1 pg (ref 26.0–34.0)
MCHC: 31.3 g/dL (ref 30.0–36.0)
MCV: 93 fL (ref 78.0–100.0)
PLATELETS: 212 10*3/uL (ref 150–400)
RBC: 2.99 MIL/uL — ABNORMAL LOW (ref 4.22–5.81)
RDW: 16.5 % — AB (ref 11.5–15.5)
WBC: 12.8 10*3/uL — AB (ref 4.0–10.5)

## 2014-10-10 MED ORDER — HEPARIN SOD (PORK) LOCK FLUSH 100 UNIT/ML IV SOLN: 500.0000 [IU] | INTRAVENOUS | Status: DC | PRN

## 2014-10-10 MED ORDER — CIPROFLOXACIN HCL 500 MG PO TABS: 500.0000 mg | ORAL_TABLET | Freq: Two times a day (BID) | ORAL | Status: DC

## 2014-10-10 MED ORDER — BOOST PLUS PO LIQD
237.0000 mL | Freq: Two times a day (BID) | ORAL | Status: DC
  Administered 2014-10-10: 237 mL via ORAL
  Filled 2014-10-10: qty 237

## 2014-10-10 NOTE — Progress Notes (Signed)
Patient discharged home, all discharge medications and instructions reviewed and questions answered. Patient to be assisted to vehicle by wheelchair.  

## 2014-10-10 NOTE — Evaluation (Signed)
Physical Therapy Evaluation Patient Details Name: Martin Morris MRN: [id number] DOB: [birth year] Today's Date: 10/10/2014   History of Present Illness  Martin Morris is an 81 y.o. male with a PMH of L hip fx April 2015 (per pt report), HTN, CAD, hyperlipidemia, PVD, BPH, OSA, NHL status post chemo/XRT who presented to [facility] ED with a chief complaint of nausea, vomiting, discolored urine, fever, bilateral flank pain.  Clinical Impression  **Pt admitted with above diagnosis. Pt currently with functional limitations due to the deficits listed below (see PT Problem List). * Pt will benefit from skilled PT to increase their independence and safety with mobility to allow discharge to the venue listed below.    Pt ambulated 140' with RW and min/guard assist, verbal cues for positioning in walker, posture, and to increase step length. HHPT recommended for strengthening/balance training. Will follow during acute stay.  *    Follow Up Recommendations Home health PT    Equipment Recommendations  None recommended by PT    Recommendations for Other Services       Precautions / Restrictions Precautions Precautions: Fall Precaution Comments: 1 fall in past year in which he sustained L hip fx April 2015 Restrictions Weight Bearing Restrictions: No      Mobility  Bed Mobility Overal bed mobility: Needs Assistance Bed Mobility: Supine to Sit     Supine to sit: Mod assist     General bed mobility comments: assist to raise trunk  Transfers Overall transfer level: Needs assistance Equipment used: Rolling walker (2 wheeled) Transfers: Sit to/from Stand Sit to Stand: Min assist;From elevated surface         General transfer comment: assist to rise  Ambulation/Gait Ambulation/Gait assistance: Min guard Ambulation Distance (Feet): 140 Feet Assistive device: Rolling walker (2 wheeled) Gait Pattern/deviations: Step-through pattern;Decreased step length - right;Decreased step length  - left;Decreased stride length;Trunk flexed   Gait velocity interpretation: Below normal speed for age/gender General Gait Details: cues to increase step length and to lift head, steady with no LOB  Stairs            Wheelchair Mobility    Modified Rankin (Stroke Patients Only)       Balance Overall balance assessment: History of Falls;Modified Independent (steady with RW)                                           Pertinent Vitals/Pain Pain Assessment: No/denies pain    Home Living Family/patient expects to be discharged to:: Private residence Living Arrangements: Spouse/significant other   Type of Home: House Home Access: Stairs to enter Entrance Stairs-Rails: Right Entrance Stairs-Number of Steps: 3 Home Layout: One level;Laundry or work area in [place]: Environmental consultant - 2 wheels;Shower seat;Bedside commode      Prior Function Level of Independence: Independent with assistive device(s)         Comments: walks with RW     Hand Dominance   Dominant Hand: Right    Extremity/Trunk Assessment   Upper Extremity Assessment: Overall WFL for tasks assessed           Lower Extremity Assessment: Overall WFL for tasks assessed      Cervical / Trunk Assessment: Kyphotic  Communication   Communication: No difficulties  Cognition Arousal/Alertness: Awake/alert Behavior During Therapy: WFL for tasks assessed/performed;Flat affect Overall Cognitive Status: Within Functional Limits for tasks assessed  General Comments      Exercises        Assessment/Plan    PT Assessment Patient needs continued PT services  PT Diagnosis Difficulty walking;Generalized weakness   PT Problem List Decreased activity tolerance;Decreased knowledge of use of DME;Decreased mobility  PT Treatment Interventions DME instruction;Gait training;Stair training;Functional mobility training;Therapeutic  activities;Patient/family education;Therapeutic exercise   PT Goals (Current goals can be found in the Care Plan section) Acute Rehab PT Goals Patient Stated Goal: to go home PT Goal Formulation: With patient Time For Goal Achievement: 10/24/14 Potential to Achieve Goals: Good    Frequency Min 3X/week   Barriers to discharge        Co-evaluation               End of Session Equipment Utilized During Treatment: Gait belt Activity Tolerance: Patient tolerated treatment well Patient left: in chair;with call bell/phone within reach;with chair alarm set Nurse Communication: Mobility status         Time: 1610-9604 PT Time Calculation (min) (ACUTE ONLY): 24 min   Charges:   PT Evaluation $Initial PT Evaluation Tier I: 1 Procedure PT Treatments $Gait Training: 8-22 mins   PT G Codes:        Philomena Doheny 10/10/2014, 10:28 AM [phone]

## 2014-10-10 NOTE — Progress Notes (Signed)
INITIAL NUTRITION ASSESSMENT  DOCUMENTATION CODES Per approved criteria  -Non-severe (moderate) malnutrition in the context of chronic illness  Pt meets criteria for moderate MALNUTRITION in the context of chronic illness as evidenced by 13% weight loss x 2 months and mild fluid accumulation.  INTERVENTION: Continue Boost Plus BID, provides 360 kcal and 14g of protein Encourage PO intake  NUTRITION DIAGNOSIS: Unintentional weight loss related to N/V as evidenced by 13% weight loss x 2 months.   Goal: Pt to meet >/= 90% of their estimated nutrition needs   Monitor:  PO and supplemental intake, weight, labs, I/O's  Reason for Assessment: Pt identified as at nutrition risk on the Malnutrition Screen Tool  Admitting Dx: UTI (lower urinary tract infection)  ASSESSMENT: 81 y.o. male with a PMH of HTN, CAD, hyperlipidemia, PVD, BPH, OSA, NHL status post chemo/XRT who presented to [facility] ED with a chief complaint of nausea, vomiting, discolored urine, fever, bilateral flank pain.  Pt reports "fine" appetite and eating well. States he ate all of his breakfast this morning. PO intake: 65-100% Pt has been ordered Boost BID PRN, RD to order BID. Pt agreeable to this.  Per weight history, pt has lost 26 lb since December (13% weight loss x  2 months, significant for time frame). Per H&P, dry weight is 190 lb.   Nutrition focused physical exam shows no sign of depletion of muscle mass or body fat. Edema: non-pitting RLE & LLE   Labs reviewed: Low Na Elevated BUN & Creatinine  Height: Ht Readings from Last 1 Encounters:  10/08/14 5\' 9"  (1.753 m)    Weight: Wt Readings from Last 1 Encounters:  10/08/14 181 lb 3.5 oz (82.2 kg)    Ideal Body Weight: 160 lb  % Ideal Body Weight: 113%  Wt Readings from Last 10 Encounters:  10/08/14 181 lb 3.5 oz (82.2 kg)  09/27/14 189 lb 14.4 oz (86.138 kg)  09/20/14 187 lb 6.4 oz (85.004 kg)  09/09/14 189 lb 12.8 oz (86.093 kg)  09/02/14 187 lb  2.7 oz (84.9 kg)  08/23/14 209 lb 8 oz (95.029 kg)  08/10/14 207 lb (93.895 kg)  08/01/14 207 lb 14.4 oz (94.303 kg)  07/14/14 212 lb 3.2 oz (96.253 kg)  07/11/14 211 lb 4.8 oz (95.845 kg)    Usual Body Weight: 205-206 lb -per pt  % Usual Body Weight: 88%  BMI:  Body mass index is 26.75 kg/(m^2).  Estimated Nutritional Needs: Kcal: 2100-2300 Protein: 110-120g Fluid: 2.1L/day  Skin: intact  Diet Order: Diet Heart  EDUCATION NEEDS: -No education needs identified at this time   Intake/Output Summary (Last 24 hours) at 10/10/14 1113 Last data filed at 10/10/14 0614  Gross per 24 hour  Intake    880 ml  Output   1475 ml  Net   -595 ml    Last BM: PTA  Labs:   Recent Labs Lab 10/08/14 1431 10/09/14 0505 10/10/14 0505  NA 140 143 134*  K 4.5 4.2 3.5  CL 105 109 101  CO2 27 27 26   BUN 31* 30* 28*  CREATININE 1.78* 1.52* 1.60*  CALCIUM 8.3* 8.3* 7.6*  GLUCOSE 127* 107* 97    CBG (last 3)  No results for input(s): GLUCAP in the last 72 hours.  Scheduled Meds: . amLODipine  5 mg Oral Daily  . atenolol  25 mg Oral Daily  . cefTRIAXone (ROCEPHIN)  IV  1 g Intravenous Q24H  . citalopram  20 mg Oral Daily  . heparin  5,000 Units Subcutaneous 3 times per day  . isosorbide mononitrate  90 mg Oral Daily  . pantoprazole  40 mg Oral BID  . pravastatin  80 mg Oral Daily    Continuous Infusions:   Past Medical History  Diagnosis Date  . Hypertension   . CAD 11/12/2007    a. s/p CABG;  b. Cath 7/11 Patent LIMA to the LAD. Previously known occlusion of a saphenous vein graft to diagonal and sequential to obtuse marginals. Severe diffuse native obtuse marginal and diagonal branch vessel disease. Preserved ejection fraction.;  c. 03/2013 Lexi CL: mild inflat and inf ischemia, EF 54%->initial med rx.  Marland Kitchen HYPERLIPIDEMIA 11/12/2007  . HYPERTENSION 11/12/2007  . PERIPHERAL VASCULAR DISEASE 11/12/2007  . ARTHRITIS 11/12/2007  . BENIGN PROSTATIC HYPERTROPHY, HX OF 11/12/2007   . Edema 10/17/2010  . INGUINAL HERNIAS, BILATERAL 11/10/2006  . Obesity, unspecified 07/05/2009  . SLEEP APNEA 11/12/2007  . History of PTCA 2005  . Osteoarthritis   . Carotid stenosis, bilateral   . Nephrolithiasis   . thymus ca dx'd 2003    Radiation comp 2003  . NHL (non-Hodgkin's lymphoma) dx'd 2008    Chemo comp 10/2010; rituxin comp 10/2010  . 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 MANTLE CELL LYMPHOMA INTRA-ABDOMINAL LYMPH NODES 11/12/2007  . Tricuspid valve regurgitation   . Prerenal renal failure 09/20/2014  . Acute on chronic diastolic CHF (congestive heart failure) 08/28/2014  . E. coli UTI 08/28/2014  . Fracture of femoral neck, left 12/09/2013  . Acute blood loss anemia 12/10/2013  . Hemorrhagic shock 08/05/2014  . Syncope 08/05/2014    Past Surgical History  Procedure Laterality Date  . Coronary artery bypass graft    . Cardiac catheterization  08/2009, 02/2010  . Total hip arthroplasty Left 12/09/2013    Procedure: TOTAL HIP ARTHROPLASTY;  Surgeon: Mauri Pole, MD;  Location: [facility];  Service: Orthopedics;  Laterality: Left;  . Esophagogastroduodenoscopy N/A 08/02/2014    Procedure: ESOPHAGOGASTRODUODENOSCOPY (EGD) at bedside;  Surgeon: Irene Shipper, MD;  Location: [facility];  Service: Endoscopy;  Laterality: N/A;  . Esophagogastroduodenoscopy (egd) with propofol N/A 08/10/2014    Procedure: ESOPHAGOGASTRODUODENOSCOPY (EGD) WITH PROPOFOL;  Surgeon: Milus Banister, MD;  Location: [facility];  Service: Endoscopy;  Laterality: N/A;    Clayton Bibles, MS, RD, LDN Pager: [phone] After Hours Pager: [phone]

## 2014-10-10 NOTE — Discharge Instructions (Signed)

## 2014-10-10 NOTE — Progress Notes (Signed)
[facility]  Patient Status: Active (receiving services up to time of hospitalization)  [facility] is providing the following services: RN  If patient discharges after hours, please call [phone].   Martin Morris 10/10/2014, 10:57 AM

## 2014-10-10 NOTE — Care Management Note (Signed)
CARE MANAGEMENT NOTE 10/10/2014  Patient:  Martin Morris, Martin Morris   Account Number:  [id number]  Date Initiated:  10/10/2014  Documentation initiated by:  Marney Doctor  Subjective/Objective Assessment:   81 yo admitted with a UTI     Action/Plan:   From home with spouse and has [place] from [facility]   Anticipated DC Date:  10/10/2014   Anticipated DC Plan:  [place]  CM consult      [facility] Choice  Resumption Of Svcs/PTA Provider   Choice offered to / List presented to:          [facility] arranged  HH-1 RN  [place].   Status of service:  Completed, signed off Medicare Important Message given?   (If response is "NO", the following Medicare IM given date fields will be blank) Date Medicare IM given:   Medicare IM given by:   Date Additional Medicare IM given:   Additional Medicare IM given by:    Discharge Disposition:    Per UR Regulation:  Reviewed for med. necessity/level of care/duration of stay  If discussed at [place] of Stay Meetings, dates discussed:    Comments:  10/10/14 Marney Doctor RN,BSN,NCM [phone] Pt is active with [facility].  PT is recommending HHPT at DC as well. MD order for HHRN/PT. [facility] rep alerted to addition of PT. No other CM needs identified.

## 2014-10-11 ENCOUNTER — Telehealth: Payer: Self-pay | Admitting: *Deleted

## 2014-10-11 LAB — URINE CULTURE
Colony Count: >=100000
SPECIAL REQUESTS: NORMAL

## 2014-10-11 NOTE — Telephone Encounter (Signed)
Yes, he can have a lab appt here. Please ask his wife when she can bring him in and then schedule a lab draw. Thanks

## 2014-10-11 NOTE — Discharge Summary (Signed)
Physician Discharge Summary  ETAN VASUDEVAN HEN:[id number] DOB: [birth year] DOA: 10/08/2014  PCP: [name], MD  Admit date: 10/08/2014 Discharge date: 10/10/2014   Recommendations for Outpatient Follow-Up:    Home health physical therapy set up.  Home health RN will draw blood work & fax to PCP/oncologist for close follow-up of CBC/chemistries.   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  Diet recommendation: Low sodium, heart healthy.     History of Present Illness:   Martin Morris is an 81 y.o. male with a PMH of HTN, CAD, hyperlipidemia, PVD, BPH, OSA, NHL status post chemo/XRT who presented to [facility] ED 10/08/14 with a chief complaint of nausea, vomiting, discolored urine, fever, bilateral flank pain.   The patient has had 2 recent hospitalizations. The first was 2015/08/17-12/18/15 where he was treated for hemorrhagic shock (required 4 units of PRBCs and a unit of platelets) secondary to a gastric ulcer hemorrhage requiring embolization of the right gastroepiploic artery. Pathology of the ulcer showed mantle cell lymphoma, and he is now on treatment with Ibrutinib for this. Because of the significance of his GI bleeding, ASA and Plavix were discontinued.   The second hospitalization was 08/28/14-09/02/14 where he was treated for acute on chronic CHF and NSTEMI (dry weight approximately 190 lbs) requiring diuresis. He was not felt to be a candidate for anticoagulation during that hospital  stay.  On initial evaluation, the patient was noted to have a WBC of 17.4 with a left shift, a hemoglobin of 9.9, and a creatinine of 1.78 (baseline around 1.5). T 98.2, BP 100/53, P 63, respirations 18 with an oxygen saturation of 93-98%. Lactic acid is 0.78. Urinalysis shows +nitrites, and TNTC WBC with many bacteria.   Hospital Course by Problem:   Principal Problem:  UTI (lower urinary tract infection) with fever and leukocytosis  Treated with Rocephin.  Urine cultures grew Escherichia coli, Cipro sensitive. We'll discharge home on an additional 5 days of therapy with Cipro.  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 Can resume at discharge.  Dr. Alvy Bimler notified of patient's admission (via Epic).   Essential hypertension  Continue Imdur/Norvasc/Atenolol as BP tolerates.  Lasix held while in the hospital.   Coronary atherosclerosis  NTG PRN. Continue Imdur.  Avoid aspirin secondary to recent history of GI hemorrhage.   Edema  Chronic RLE edema since CABG. Monitor.   Obesity (BMI 30-39.9)  Monitor nutritional status.   Anemia in neoplastic disease  Hemoglobin stable with slight trend down over the course of hospital stay with no current indication for transfusion.  Close follow-up of hemoglobin recommended.   Chronic kidney disease (CKD), stage II (mild)  Creatinine now back to usual baseline value of 1.5.  Gently hydrated x 12 hours.   Malignant gastric ulcer: proximal per EGD 08/02/14  Continue BID PPI.   Hyperlipidemia  Continue Pravachol.   Headache  CT  head unremarkable.    Medical Consultants:    None.   Discharge Exam:   Filed Vitals:   10/10/14 0448  BP: 116/56  Pulse: 66  Temp: 98 F (36.7 C)  Resp: 16   Filed Vitals:   10/09/14 1317 10/09/14 1449 10/09/14 2045 10/10/14 0448  BP:  118/54 100/50 116/56  Pulse:  66 57 66  Temp:   98.7 F (37.1 C) 99.2 F (37.3 C) 98 F (36.7 C)  TempSrc:  Axillary Axillary Oral  Resp:  18 16 16   Height:      Weight:      SpO2: 94% 96% 93% 98%    Gen:  NAD Cardiovascular:  RRR, No M/R/G Respiratory: Lungs CTAB Gastrointestinal: Abdomen soft, NT/ND with normal active bowel sounds. Extremities: Chronic right lower extremity edema   The results of significant diagnostics from this hospitalization (including imaging, microbiology, ancillary and laboratory) are listed below for reference.     Procedures and Diagnostic Studies:   Dg Chest 2 View 10/08/2014: Cardiomegaly. No acute cardiopulmonary process.   Ct Head Wo Contrast 10/08/2014: Diffuse atrophy and chronic small vessel ischemic change. No acute intracranial process.   Dg Abd 2 Views 10/08/2014: Unremarkable bowel gas pattern. No evidence of pneumoperitoneum. One gastric endoclip overlying the lower left abdomen, presumably in bowel.    Labs:   Basic Metabolic Panel:  Recent Labs Lab 10/08/14 1431 10/09/14 0505 10/10/14 0505  NA 140 143 134*  K 4.5 4.2 3.5  CL 105 109 101  CO2 27 27 26   GLUCOSE 127* 107* 97  BUN 31* 30* 28*  CREATININE 1.78* 1.52* 1.60*  CALCIUM 8.3* 8.3* 7.6*   GFR Estimated Creatinine Clearance: 37.4 mL/min (by C-G formula based on Cr of 1.6). Liver Function Tests:  Recent Labs Lab 10/08/14 1431  AST 25  ALT 21  ALKPHOS 95  BILITOT 0.8  PROT 5.4*  ALBUMIN 2.7*    Recent Labs Lab 10/08/14 1431  LIPASE 19   CBC:  Recent Labs Lab 10/08/14 1431 10/09/14 0505 10/10/14 0505  WBC 17.4* 16.9* 12.8*  NEUTROABS 14.4*  --   --   HGB 9.9* 9.4* 8.7*  HCT 31.6* 29.9* 27.8*  MCV 92.1 93.4 93.0  PLT 251 229 212   Microbiology Recent Results (from the past 240 hour(s))  Urine culture     Status: None   Collection Time: 10/08/14  3:27 PM  Result Value Ref Range Status   Specimen Description URINE, CATHETERIZED  Final   Special Requests Normal  Final   Colony Count    Final    >=100,000 COLONIES/ML Performed at [facility]    Culture   Final    ESCHERICHIA COLI Performed at [facility]    Report Status 10/11/2014 FINAL  Final   Organism ID, Bacteria ESCHERICHIA COLI  Final      Susceptibility   Escherichia coli - MIC*    AMPICILLIN >=32 RESISTANT Resistant     CEFAZOLIN <=4 SENSITIVE Sensitive     CEFTRIAXONE <=1 SENSITIVE Sensitive     CIPROFLOXACIN 1 SENSITIVE Sensitive     GENTAMICIN >=16 RESISTANT Resistant     LEVOFLOXACIN 1 SENSITIVE Sensitive     NITROFURANTOIN <=16 SENSITIVE Sensitive     TOBRAMYCIN 8 INTERMEDIATE Intermediate     TRIMETH/SULFA >=320 RESISTANT Resistant     PIP/TAZO <=4 SENSITIVE Sensitive     * ESCHERICHIA COLI     Discharge Instructions:   Discharge Instructions    Call MD for:  persistant nausea and vomiting    Complete by:  As directed      Call MD for:  severe uncontrolled pain    Complete by:  As directed      Call MD for:  temperature >100.4    Complete by:  As directed      Diet - low sodium heart healthy    Complete by:  As directed      Discharge instructions    Complete by:  As directed   A home health nurse will check your blood counts in 3 days and fax the results to Dr. Alvy Bimler and your PCP.  You were cared for by Dr. Jacquelynn Cree  (a hospitalist) during your hospital stay. If you have any questions about your discharge medications or the care you received while you were in the hospital after you are discharged, you can call the unit and ask to speak with the hospitalist on call if the hospitalist that took care of you is not available. Once you are discharged, your primary care physician will handle any further medical issues. Please note that NO REFILLS for any discharge medications will be authorized once you are discharged, as it is imperative that you return to your primary care physician (or establish a relationship with a primary care physician if you do not have one) for  your aftercare needs so that they can reassess your need for medications and monitor your lab values.  Any outstanding tests can be reviewed by your PCP at your follow up visit.  It is also important to review any medicine changes with your PCP.  Please bring these d/c instructions with you to your next visit so your physician can review these changes with you.  If you do not have a primary care physician, you can call [phone] for a physician referral.  It is highly recommended that you obtain a PCP for hospital follow up.     Face-to-face encounter (required for Medicare/Medicaid patients)    Complete by:  As directed   I Saveah Bahar certify that this patient is under my care and that I, or a nurse practitioner or physician's assistant working with me, had a face-to-face encounter that meets the physician face-to-face encounter requirements with this patient on 10/10/2014. The encounter with the patient was in whole, or in part for the following medical condition(s) which is the primary reason for home health care (List medical condition): Please draw CBC in 3 days and fax to PCP and Dr. Alvy Bimler.  PT for strength training.  Monitor for deterioration of condition, high risk of rehospitalization.  The encounter with the patient was in whole, or in part, for the following medical condition, which is the primary reason for home health care:  UTI, deconditioning, Mantle cell lymphoma  I certify that, based on my findings, the following services are medically necessary home health services:  Nursing  Reason for Medically Necessary Home Health Services:   Skilled Nursing- Skilled Assessment/Observation Skilled Nursing- Teaching of Disease Process/Symptom Management Skilled Nursing- Change/Decline in Patient Status Therapy- Therapeutic Exercises to Increase Strength and Endurance    My clinical findings support the need for the above services:  Unable to leave home safely without assistance and/or assistive  device  Further, I certify that my clinical findings support that this patient is homebound due to:  Unable to leave home safely without assistance     Home Health    Complete by:  As directed   To provide the  following care/treatments:   PT RN       Increase activity slowly    Complete by:  As directed             Medication List    STOP taking these medications        aspirin EC 81 MG tablet      TAKE these medications        amLODipine 5 MG tablet  Commonly known as:  NORVASC  Take 1 tablet (5 mg total) by mouth daily.     atenolol 25 MG tablet  Commonly known as:  TENORMIN  Take 25 mg by mouth daily.     ciprofloxacin 500 MG tablet  Commonly known as:  CIPRO  Take 1 tablet (500 mg total) by mouth 2 (two) times daily.     citalopram 20 MG tablet  Commonly known as:  CELEXA  TAKE ONE TABLET BY MOUTH ONCE DAILY     furosemide 40 MG tablet  Commonly known as:  LASIX  Take 40 mg by mouth daily. Pt takes 1/2 tablet (20 mg )     HYDROcodone-acetaminophen 5-325 MG per tablet  Commonly known as:  NORCO/VICODIN  Take 1-2 tablets by mouth every 6 (six) hours as needed for moderate pain.     ibrutinib 140 MG capsul  Commonly known as:  IMBRUVICA  Take 4 capsules (560 mg total) by mouth daily.     isosorbide mononitrate 30 MG 24 hr tablet  Commonly known as:  IMDUR  Take 3 tablets (90 mg total) by mouth daily.     lactose free nutrition Liqd  Take 237 mLs by mouth 2 (two) times daily as needed (Allow w/pt or pt's wife request after diet advancement).     methocarbamol 500 MG tablet  Commonly known as:  ROBAXIN  Take 1 tablet (500 mg total) by mouth every 6 (six) hours as needed for muscle spasms.     nitroGLYCERIN 0.4 MG SL tablet  Commonly known as:  NITROSTAT  Place 1 tablet (0.4 mg total) under the tongue every 5 (five) minutes as needed for chest pain.     pantoprazole 40 MG tablet  Commonly known as:  PROTONIX  Take 1 tablet (40 mg total) by mouth 2  (two) times daily.     pravastatin 80 MG tablet  Commonly known as:  PRAVACHOL  Take 1 tablet (80 mg total) by mouth daily.           Follow-up Information    Follow up with [name], MD. Schedule an appointment as soon as possible for a visit in 1 week.   Specialty:  Family Medicine   Why:  If symptoms worsen   Contact information:   [place] [address] [phone]       Follow up with [facility], NI, MD.   Specialty:  Hematology and Oncology   Why:  At your appt times noted below.   Contact information:   [place] [address] [phone]       Follow up with [facility]   Why:  Call to schedule an appointment to evaluate cause of recurrent urinary tract infections.   Contact information:   [address]  [place] [address] [place] [address] [phone]        Time coordinating discharge: 35 minutes.  Signed:  Hisako Bugh  Pager [phone] [facility] 10/11/2014, 7:05 PM

## 2014-10-11 NOTE — Telephone Encounter (Signed)
Kathlee Nations, RN w/ [facility] says pt d/c'd home today and Hospitalist ordered CBC to be drawn by home care RN in 3 days.  She says pt is a very difficult peripheral stick and his PAC had not been giving good blood return.  She asks if pt can be scheduled to have labs done at [facility] instead?

## 2014-10-12 ENCOUNTER — Telehealth: Payer: Self-pay | Admitting: Hematology and Oncology

## 2014-10-12 ENCOUNTER — Telehealth: Payer: Self-pay | Admitting: *Deleted

## 2014-10-12 DIAGNOSIS — D630 Anemia in neoplastic disease: Secondary | ICD-10-CM

## 2014-10-12 DIAGNOSIS — C8313 Mantle cell lymphoma, intra-abdominal lymph nodes: Secondary | ICD-10-CM

## 2014-10-12 NOTE — Telephone Encounter (Signed)
CBC, hold, CMP

## 2014-10-12 NOTE — Telephone Encounter (Signed)
[facility] RN requests labs be drawn at our clinic as pt is difficult stick and his PAC has not been having good blood return.  Lab scheduled to be drawn by flush nurse tomorrow at 11:15 am.  Notified wife of time. She verbalized understanding.   Wife states pt referred to [facility] and has appt to see Dr. Jeffie Pollock on Tuesday 2/23.  She says Dr. Rockne Menghini thought pt may not be emptying his bladder fully.

## 2014-10-12 NOTE — Telephone Encounter (Signed)
, °

## 2014-10-13 ENCOUNTER — Ambulatory Visit (HOSPITAL_BASED_OUTPATIENT_CLINIC_OR_DEPARTMENT_OTHER): Payer: Medicare Other

## 2014-10-13 ENCOUNTER — Telehealth: Payer: Self-pay | Admitting: *Deleted

## 2014-10-13 ENCOUNTER — Other Ambulatory Visit (HOSPITAL_BASED_OUTPATIENT_CLINIC_OR_DEPARTMENT_OTHER): Payer: Medicare Other

## 2014-10-13 VITALS — BP 129/60 | HR 64 | Temp 97.5°F

## 2014-10-13 DIAGNOSIS — D630 Anemia in neoplastic disease: Secondary | ICD-10-CM

## 2014-10-13 DIAGNOSIS — Z95828 Presence of other vascular implants and grafts: Secondary | ICD-10-CM

## 2014-10-13 DIAGNOSIS — C8313 Mantle cell lymphoma, intra-abdominal lymph nodes: Secondary | ICD-10-CM

## 2014-10-13 DIAGNOSIS — Z452 Encounter for adjustment and management of vascular access device: Secondary | ICD-10-CM

## 2014-10-13 LAB — CBC WITH DIFFERENTIAL/PLATELET
BASO%: 0.9 % (ref 0.0–2.0)
Basophils Absolute: 0.1 10*3/uL (ref 0.0–0.1)
EOS%: 2.6 % (ref 0.0–7.0)
Eosinophils Absolute: 0.3 10*3/uL (ref 0.0–0.5)
HCT: 31.4 % — ABNORMAL LOW (ref 38.4–49.9)
HEMOGLOBIN: 10.2 g/dL — AB (ref 13.0–17.1)
LYMPH#: 3.2 10*3/uL (ref 0.9–3.3)
LYMPH%: 26.4 % (ref 14.0–49.0)
MCH: 29 pg (ref 27.2–33.4)
MCHC: 32.5 g/dL (ref 32.0–36.0)
MCV: 89.2 fL (ref 79.3–98.0)
MONO#: 1 10*3/uL — ABNORMAL HIGH (ref 0.1–0.9)
MONO%: 7.9 % (ref 0.0–14.0)
NEUT#: 7.5 10*3/uL — ABNORMAL HIGH (ref 1.5–6.5)
NEUT%: 62.2 % (ref 39.0–75.0)
NRBC: 0 % (ref 0–0)
Platelets: 314 10*3/uL (ref 140–400)
RBC: 3.52 10*6/uL — AB (ref 4.20–5.82)
RDW: 16.2 % — ABNORMAL HIGH (ref 11.0–14.6)
WBC: 12.1 10*3/uL — ABNORMAL HIGH (ref 4.0–10.3)

## 2014-10-13 LAB — COMPREHENSIVE METABOLIC PANEL (CC13)
ALT: 43 U/L (ref 0–55)
AST: 46 U/L — ABNORMAL HIGH (ref 5–34)
Albumin: 2.8 g/dL — ABNORMAL LOW (ref 3.5–5.0)
Alkaline Phosphatase: 109 U/L (ref 40–150)
Anion Gap: 11 mEq/L (ref 3–11)
BILIRUBIN TOTAL: 0.47 mg/dL (ref 0.20–1.20)
BUN: 24.8 mg/dL (ref 7.0–26.0)
CHLORIDE: 104 meq/L (ref 98–109)
CO2: 25 meq/L (ref 22–29)
Calcium: 9.1 mg/dL (ref 8.4–10.4)
Creatinine: 1.4 mg/dL — ABNORMAL HIGH (ref 0.7–1.3)
EGFR: 46 mL/min/{1.73_m2} — AB (ref >90)
Glucose: 99 mg/dl (ref 70–140)
POTASSIUM: 4.2 meq/L (ref 3.5–5.1)
Sodium: 140 mEq/L (ref 136–145)
Total Protein: 5.7 g/dL — ABNORMAL LOW (ref 6.4–8.3)

## 2014-10-13 LAB — HOLD TUBE, BLOOD BANK

## 2014-10-13 LAB — TECHNOLOGIST REVIEW

## 2014-10-13 MED ORDER — HEPARIN SOD (PORK) LOCK FLUSH 100 UNIT/ML IV SOLN
500.0000 [IU] | Freq: Once | INTRAVENOUS | Status: AC
  Administered 2014-10-13: 500 [IU] via INTRAVENOUS
  Filled 2014-10-13: qty 5

## 2014-10-13 MED ORDER — SODIUM CHLORIDE 0.9 % IJ SOLN
10.0000 mL | INTRAMUSCULAR | Status: DC | PRN
  Administered 2014-10-13: 10 mL via INTRAVENOUS
  Filled 2014-10-13: qty 10

## 2014-10-13 NOTE — Telephone Encounter (Signed)
-----   Message from Heath Lark, MD sent at 10/13/2014 12:58 PM EST ----- Regarding: CMET ok Tests ok. ----- Message -----    From: Lab in [facility] Interface    Sent: 10/13/2014  12:25 PM      To: Heath Lark, MD

## 2014-10-13 NOTE — Patient Instructions (Signed)

## 2014-10-13 NOTE — Telephone Encounter (Signed)
Left VM for pt and wife informing labs are ok today.  Keep next appt on 2/25 as scheduled.  Please call us back if any questions.

## 2014-10-13 NOTE — Progress Notes (Signed)
Progress Note   Martin Morris BDZ:[id number] DOB: Aug 12, 1933 DOA: 10/08/2014 PCP: [name], MD   Brief Narrative:   Martin Morris is an 81 y.o. male with a PMH of HTN, CAD, hyperlipidemia, PVD, BPH, OSA, NHL status post chemo/XRT who presented to [facility] ED 10/08/14 with a chief complaint of nausea, vomiting, discolored urine, fever, bilateral flank pain.   The patient has had 2 recent hospitalizations. The first was Aug 05, 2015-08/12/14 where he was treated for hemorrhagic shock (required 4 units of PRBCs and a unit of platelets) secondary to a gastric ulcer hemorrhage requiring embolization of the right gastroepiploic artery. Pathology of the ulcer showed mantle cell lymphoma, and he is now on treatment with Ibrutinib for this. Because of the significance of his GI bleeding, ASA and Plavix were discontinued.   The second hospitalization was 08/28/14-09/02/14 where he was treated for acute on chronic CHF and NSTEMI (dry weight approximately 190 lbs) requiring diuresis. He was not felt to be a candidate for anticoagulation during that hospital stay.  On initial evaluation, the patient was noted to have a WBC of 17.4 with a left shift, a hemoglobin of 9.9, and a creatinine of 1.78 (baseline around 1.5). T 98.2, BP 100/53, P 63, respirations 18 with an oxygen saturation of 93-98%. Lactic acid is 0.78. Urinalysis shows +nitrites, and TNTC WBC with many bacteria.   Assessment/Plan:   Principal Problem:  UTI (lower urinary tract infection) with fever and leukocytosis  Continue Rocephin while awaiting culture results.  F/U urine culture.  Narrow antibiotics based on culture/sensitivities.  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  Dr. Alvy Bimler notified of patient's admission (via Epic).   Essential hypertension  Continue Imdur/Norvasc/Atenolol as BP tolerates.  Lasix remains on  hold.   Coronary atherosclerosis  NTG PRN. Continue Imdur.  Avoid aspirin secondary to recent history of GI hemorrhage.   Edema  Chronic RLE edema since CABG. Monitor.   Obesity (BMI 30-39.9)  Monitor nutritional status.   Anemia in neoplastic disease  Hemoglobin stable with no current indication for transfusion.   Chronic kidney disease (CKD), stage II (mild)  Creatinine now back to usual baseline value of 1.5.  Gently hydrated x 12 hours.   Malignant gastric ulcer: proximal per EGD 08/02/14  Continue BID PPI.   Hyperlipidemia  Continue Pravachol.   Headache  CT head unremarkable.  DVT prophylaxis  SQ heparin ordered.  Code Status: Full.  Family Communication: Pam [phone] (cell), wife updated. Disposition Plan: Home when stable and urine cultures back.  IV Access:    Porta-Cath   Procedures and diagnostic studies:   Dg Chest 2 View 10/08/2014: Cardiomegaly.  No acute cardiopulmonary process.    Ct Head Wo Contrast 10/08/2014: Diffuse atrophy and chronic small vessel ischemic change.  No acute intracranial process.    Dg Abd 2 Views 10/08/2014: Unremarkable bowel gas pattern.  No evidence of pneumoperitoneum.  One gastric endoclip overlying the lower left abdomen, presumably in bowel.     Medical Consultants:    None.  Anti-Infectives:    Rocephin 10/08/14--->  Subjective:   Martin Morris is a bit irritable because he wants to go home.  Denies dyspnea, cough, nausea/vomiting, diarrhea.  Overall, feels well but appetite fair.  Objective:    Filed Vitals:   10/09/14 1317 10/09/14 1449 10/09/14 2045 10/10/14 0448  BP:  118/54 100/50 116/56  Pulse:  66 57 66  Temp:  98.7 F (37.1 C) 99.2 F (37.3 C) 98 F (36.7 C)  TempSrc:  Axillary Axillary Oral  Resp:  18 16 16   Height:      Weight:      SpO2: 94% 96% 93% 98%   No intake or output data in the 24 hours ending 10/13/14 1437  Exam: Gen:  NAD Psych: Angry  affect Cardiovascular:  RRR, No M/R/G Respiratory:  Lungs CTAB Gastrointestinal:  Abdomen soft, NT/ND, + BS Extremities:  RLE edema (chronic)   Data Reviewed:    Labs: Basic Metabolic Panel:  Recent Labs Lab 10/08/14 1431 10/09/14 0505 10/10/14 0505 10/13/14 1213  NA 140 143 134* 140  K 4.5 4.2 3.5 4.2  CL 105 109 101  --   CO2 27 27 26 25   GLUCOSE 127* 107* 97 99  BUN 31* 30* 28* 24.8  CREATININE 1.78* 1.52* 1.60* 1.4*  CALCIUM 8.3* 8.3* 7.6* 9.1   GFR Estimated Creatinine Clearance: 42.8 mL/min (by C-G formula based on Cr of 1.4). Liver Function Tests:  Recent Labs Lab 10/08/14 1431 10/13/14 1213  AST 25 46*  ALT 21 43  ALKPHOS 95 109  BILITOT 0.8 0.47  PROT 5.4* 5.7*  ALBUMIN 2.7* 2.8*    Recent Labs Lab 10/08/14 1431  LIPASE 19   CBC:  Recent Labs Lab 10/08/14 1431 10/09/14 0505 10/10/14 0505 10/13/14 1213  WBC 17.4* 16.9* 12.8* 12.1*  NEUTROABS 14.4*  --   --  7.5*  HGB 9.9* 9.4* 8.7* 10.2*  HCT 31.6* 29.9* 27.8* 31.4*  MCV 92.1 93.4 93.0 89.2  PLT 251 229 212 314   BNP (last 3 results)  Recent Labs  12/09/13 0130  PROBNP 630.1*   Sepsis Labs:  Recent Labs Lab 10/08/14 1431 10/08/14 1646 10/09/14 0505 10/10/14 0505 10/13/14 1213  WBC 17.4*  --  16.9* 12.8* 12.1*  LATICACIDVEN  --  0.78  --   --   --    Microbiology Recent Results (from the past 240 hour(s))  Urine culture     Status: None   Collection Time: 10/08/14  3:27 PM  Result Value Ref Range Status   Specimen Description URINE, CATHETERIZED  Final   Special Requests Normal  Final   Colony Count   Final    >=100,000 COLONIES/ML Performed at [facility]    Culture   Final    ESCHERICHIA COLI Performed at [facility]    Report Status 10/11/2014 FINAL  Final   Organism ID, Bacteria ESCHERICHIA COLI  Final      Susceptibility   Escherichia coli - MIC*    AMPICILLIN >=32 RESISTANT Resistant     CEFAZOLIN <=4 SENSITIVE Sensitive      CEFTRIAXONE <=1 SENSITIVE Sensitive     CIPROFLOXACIN 1 SENSITIVE Sensitive     GENTAMICIN >=16 RESISTANT Resistant     LEVOFLOXACIN 1 SENSITIVE Sensitive     NITROFURANTOIN <=16 SENSITIVE Sensitive     TOBRAMYCIN 8 INTERMEDIATE Intermediate     TRIMETH/SULFA >=320 RESISTANT Resistant     PIP/TAZO <=4 SENSITIVE Sensitive     * ESCHERICHIA COLI     Medications:    Continuous Infusions:  Time spent: 25 minutes.    LOS: 2 days   Vikki Gains  [facility] Pager [phone]. If unable to reach me by pager, please call my cell phone at [phone].  *Please refer to [email or web address], password [email or web address] to get updated schedule on who will round on this patient, as hospitalists switch teams weekly. If 7PM-7AM,  please contact night-coverage at [email or web address], password [email or web address] for any overnight needs.  10/13/2014, 2:37 PM

## 2014-10-14 ENCOUNTER — Telehealth: Payer: Self-pay | Admitting: Cardiology

## 2014-10-14 NOTE — Telephone Encounter (Signed)
Home care nurse assessed this patient. Found R leg swelling, left leg WNL.  Erythema to soles of feet. Patient denies leg tenderness.   Lung sounds diminished, patient denies dyspnea.  She notes he is on Furosemide 20mg  daily. He has had a recent 3 lb weight loss. Also has been recently discharged from hospital.  Nurse would like to know best advice.

## 2014-10-14 NOTE — Telephone Encounter (Signed)
Nurse called back. Advised no change at this time if pt not SOB and weight is down - will give indication directly to patient via phone call if Dr. Percival Spanish has any advice.

## 2014-10-20 ENCOUNTER — Other Ambulatory Visit (HOSPITAL_BASED_OUTPATIENT_CLINIC_OR_DEPARTMENT_OTHER): Payer: Medicare Other

## 2014-10-20 ENCOUNTER — Ambulatory Visit (HOSPITAL_BASED_OUTPATIENT_CLINIC_OR_DEPARTMENT_OTHER): Payer: Medicare Other

## 2014-10-20 ENCOUNTER — Ambulatory Visit (HOSPITAL_BASED_OUTPATIENT_CLINIC_OR_DEPARTMENT_OTHER): Payer: Medicare Other | Admitting: Hematology and Oncology

## 2014-10-20 ENCOUNTER — Telehealth: Payer: Self-pay | Admitting: Hematology and Oncology

## 2014-10-20 VITALS — Ht 69.0 in | Wt 189.8 lb

## 2014-10-20 VITALS — BP 120/63 | HR 59 | Temp 97.6°F | Wt 189.8 lb

## 2014-10-20 DIAGNOSIS — N182 Chronic kidney disease, stage 2 (mild): Secondary | ICD-10-CM

## 2014-10-20 DIAGNOSIS — D630 Anemia in neoplastic disease: Secondary | ICD-10-CM

## 2014-10-20 DIAGNOSIS — C8313 Mantle cell lymphoma, intra-abdominal lymph nodes: Secondary | ICD-10-CM

## 2014-10-20 DIAGNOSIS — Z95828 Presence of other vascular implants and grafts: Secondary | ICD-10-CM

## 2014-10-20 LAB — CBC WITH DIFFERENTIAL/PLATELET
BASO%: 0.7 % (ref 0.0–2.0)
Basophils Absolute: 0.1 10*3/uL (ref 0.0–0.1)
EOS%: 3.8 % (ref 0.0–7.0)
Eosinophils Absolute: 0.4 10*3/uL (ref 0.0–0.5)
HEMATOCRIT: 31.7 % — AB (ref 38.4–49.9)
HEMOGLOBIN: 9.9 g/dL — AB (ref 13.0–17.1)
LYMPH#: 3 10*3/uL (ref 0.9–3.3)
LYMPH%: 29.2 % (ref 14.0–49.0)
MCH: 28.6 pg (ref 27.2–33.4)
MCHC: 31.2 g/dL — ABNORMAL LOW (ref 32.0–36.0)
MCV: 91.6 fL (ref 79.3–98.0)
MONO#: 0.9 10*3/uL (ref 0.1–0.9)
MONO%: 9 % (ref 0.0–14.0)
NEUT#: 5.9 10*3/uL (ref 1.5–6.5)
NEUT%: 57.3 % (ref 39.0–75.0)
Platelets: 284 10*3/uL (ref 140–400)
RBC: 3.46 10*6/uL — ABNORMAL LOW (ref 4.20–5.82)
RDW: 16.9 % — AB (ref 11.0–14.6)
WBC: 10.4 10*3/uL — AB (ref 4.0–10.3)

## 2014-10-20 LAB — COMPREHENSIVE METABOLIC PANEL (CC13)
ALBUMIN: 2.9 g/dL — AB (ref 3.5–5.0)
ALT: 8 U/L (ref 0–55)
AST: 15 U/L (ref 5–34)
Alkaline Phosphatase: 106 U/L (ref 40–150)
Anion Gap: 10 mEq/L (ref 3–11)
BUN: 22.1 mg/dL (ref 7.0–26.0)
CO2: 26 meq/L (ref 22–29)
Calcium: 8.6 mg/dL (ref 8.4–10.4)
Chloride: 103 mEq/L (ref 98–109)
Creatinine: 1.5 mg/dL — ABNORMAL HIGH (ref 0.7–1.3)
EGFR: 42 mL/min/{1.73_m2} — AB (ref >90)
GLUCOSE: 104 mg/dL (ref 70–140)
Potassium: 4 mEq/L (ref 3.5–5.1)
SODIUM: 139 meq/L (ref 136–145)
Total Bilirubin: 0.59 mg/dL (ref 0.20–1.20)
Total Protein: 5.5 g/dL — ABNORMAL LOW (ref 6.4–8.3)

## 2014-10-20 LAB — HOLD TUBE, BLOOD BANK

## 2014-10-20 MED ORDER — SODIUM CHLORIDE 0.9 % IJ SOLN
10.0000 mL | INTRAMUSCULAR | Status: DC | PRN
  Administered 2014-10-20: 10 mL via INTRAVENOUS
  Filled 2014-10-20: qty 10

## 2014-10-20 MED ORDER — HEPARIN SOD (PORK) LOCK FLUSH 100 UNIT/ML IV SOLN
500.0000 [IU] | Freq: Once | INTRAVENOUS | Status: AC
  Administered 2014-10-20: 500 [IU] via INTRAVENOUS
  Filled 2014-10-20: qty 5

## 2014-10-20 NOTE — Patient Instructions (Signed)

## 2014-10-20 NOTE — Telephone Encounter (Signed)
Gave avs & calendar for March. °

## 2014-10-21 NOTE — Assessment & Plan Note (Signed)
This is likely anemia of chronic disease, recent GI bleed and chronic kidney disease. The patient denies recent history of bleeding such as epistaxis, hematuria or hematochezia. He is asymptomatic from the anemia. We will observe for now.

## 2014-10-21 NOTE — Progress Notes (Signed)
[facility] OFFICE PROGRESS NOTE  Patient Care Team: Dione Housekeeper, MD as PCP - General (Family Medicine) Heath Lark, MD as Consulting Physician (Hematology and Oncology) Irene Shipper, MD as Consulting Physician (Gastroenterology)  SUMMARY OF ONCOLOGIC HISTORY:  I reviewed the patient's records extensive and collaborated the history with the patient. Summary of his history is as follows: He was diagnosed with Mantle cell lymphoma diagnosed in March 2008 with positive bone marrow initially on 12/03/2006 and then negative bone marrow after treatment in October 2009. The patient presented with obstructive liver abnormalities requiring ERCP and non metal stent placement by Dr. Erskine Emery. The stent was subsequently removed in October 2008.  The patient was treated with 8 cycles of Rituxan, Cytoxan, vincristine and Decadron in combination with Neulasta from 12/19/2006 through 05/15/2007. The patient then received maintenance Rituxan from July 16, 2007 through February 01, 2010. While on Rituxan, he had a recurrence of his disease by CT scan carried out on 01/24/2010. Mr. Gilham then received treatment with bendamustine and Rituxan in combination with Neulasta for 6 cycles from 02/19/2010 through 07/26/2010. PET scan from 08/22/2010 and CT scans of chest, abdomen, and pelvis from 01/01/2011 that showed no evidence of disease. He had been off treatment since December 2011 and had been doing well without any symptoms until the CT scan of the abdomen and pelvis on 07/01/2011 showed signs of recurrence. CT scans of abdomen and pelvis with IVC on 09/02/11 showed further progression. Rituxan, subcutaneous Velcade, intravenous Cytoxan and Decadron were initiated on 10/15/2011. CT scans of the abdomen and pelvis carried out on 01/08/2012 showed a near complete response to therapy. CT scan of the abdomen and pelvis with IV contrast carried out on 06/19/2012 showed stable plaque-like soft tissue density  in the upper abdominal retroperitoneum compared with the CT scan of 01/08/2012. CT scan of the abdomen and pelvis with IV contrast carried out on 11/30/2012 showed no evidence of recurrent lymphoma.  As of 08/05/2013, treatment program will be as follows:  -Velcade 2.75 mg subcu.  -Cytoxan 400 mg IV.  -Decadron 20 mg IV.  The above drugs will be given every 3 weeks.  -Rituxan 800 mg IV every 6 weeks.  Previously Velcade, Cytoxan, and Decadron were being administered every 2 weeks, and Rituxan was being administered every 4 weeks. (from 12/04/2012 - 08/05/2013). Prior to that  beginning on 10/15/2011 - 12/04/2012, Velcade, Cytoxan, and Decadron were being administered weekly, and Rituxan was being administered every 2 weeks. In August of 2014, he was scheduled  for chemotherapy but had chest pain and it was held. He underwent a cardiac cath which revealed two of three native vessel ischemic disease on 04/13/2013 but he was a poor surgical candidate for a re-do CABG. His therapy was re-started in October 2014.   Overall he's tolerated his treatment without difficulty.  In April he had an accidental fall resulting in a left hip fracture.  He underwent L THR on 12/09/2013 by Dr. Paralee Cancel.  He was discharged to rehab.  Prior to his fall, he had restaging scans including CT C/A/P on 04/09 which demonstrated absence of recurrent lymphoma in the chest, abdomen or pelvis.  Repeat PET/CT scan on 07/27/2014 show significant disease progression. Rituximab was discontinued  The patient was admitted to the hospital from 08/02/2014 to 08/12/2014 due to significant GI bleed from malignant tumor in his stomach that was cauterized , biopsy proven to be recurrence of lymphoma.  He was in the intensive care  unit due to hemorrhagic shock requiring significant blood transfusion. The patient also developed mild acute encephalopathy with minor neurological deficit which subsequently resolved. Between 08/28/2014 to 09/02/2014,  he was admitted to the hospital with acute on chronic heart failure, chronic kidney disease and Escherichia coli sepsis On 09/14/2014, he was started on Ibrutinib.  INTERVAL HISTORY: Please see below for problem oriented charting. He feels well. His wife reported no confusion. He denies chest pain or shortness of breast. He tolerated treatment well without side effects.  REVIEW OF SYSTEMS:   Constitutional: Denies fevers, chills or abnormal weight loss Eyes: Denies blurriness of vision Ears, nose, mouth, throat, and face: Denies mucositis or sore throat Respiratory: Denies cough, dyspnea or wheezes Cardiovascular: Denies palpitation, chest discomfort or lower extremity swelling Gastrointestinal:  Denies nausea, heartburn or change in bowel habits Skin: Denies abnormal skin rashes Lymphatics: Denies new lymphadenopathy or easy bruising Neurological:Denies numbness, tingling or new weaknesses Behavioral/Psych: Mood is stable, no new changes  All other systems were reviewed with the patient and are negative.  I have reviewed the past medical history, past surgical history, social history and family history with the patient and they are unchanged from previous note.  ALLERGIES:  is allergic to atorvastatin.  MEDICATIONS:  Current Outpatient Prescriptions  Medication Sig Dispense Refill  . cephALEXin (KEFLEX) 500 MG capsule Take 500 mg by mouth daily.    Marland Kitchen amLODipine (NORVASC) 5 MG tablet Take 1 tablet (5 mg total) by mouth daily. 30 tablet 11  . atenolol (TENORMIN) 25 MG tablet Take 25 mg by mouth daily.    . citalopram (CELEXA) 20 MG tablet TAKE ONE TABLET BY MOUTH ONCE DAILY 30 tablet 2  . furosemide (LASIX) 40 MG tablet Take 40 mg by mouth daily. Pt takes 1/2 tablet (20 mg )    . HYDROcodone-acetaminophen (NORCO/VICODIN) 5-325 MG per tablet Take 1-2 tablets by mouth every 6 (six) hours as needed for moderate pain. (Patient taking differently: Take 1-2 tablets by mouth as needed for  moderate pain. ) 100 tablet 0  . ibrutinib (IMBRUVICA) 140 MG capsul Take 4 capsules (560 mg total) by mouth daily. 120 capsule 9  . isosorbide mononitrate (IMDUR) 30 MG 24 hr tablet Take 3 tablets (90 mg total) by mouth daily. 90 tablet 5  . lactose free nutrition (BOOST PLUS) LIQD Take 237 mLs by mouth 2 (two) times daily as needed (Allow w/pt or pt's wife request after diet advancement). 60 Can 0  . methocarbamol (ROBAXIN) 500 MG tablet Take 1 tablet (500 mg total) by mouth every 6 (six) hours as needed for muscle spasms. (Patient not taking: Reported on 10/08/2014) 50 tablet 0  . nitroGLYCERIN (NITROSTAT) 0.4 MG SL tablet Place 1 tablet (0.4 mg total) under the tongue every 5 (five) minutes as needed for chest pain. 25 tablet prn  . pantoprazole (PROTONIX) 40 MG tablet Take 1 tablet (40 mg total) by mouth 2 (two) times daily. 60 tablet 2  . pravastatin (PRAVACHOL) 80 MG tablet Take 1 tablet (80 mg total) by mouth daily. 30 tablet 11   No current facility-administered medications for this visit.    PHYSICAL EXAMINATION: ECOG PERFORMANCE STATUS: 1 - Symptomatic but completely ambulatory  There were no vitals filed for this visit. Filed Weights   10/20/14 1224  Weight: 189 lb 12.8 oz (86.093 kg)    GENERAL:alert, no distress and comfortable SKIN: skin color, texture, turgor are normal, no rashes or significant lesions EYES: normal, Conjunctiva are pink and non-injected, sclera  clear OROPHARYNX:no exudate, no erythema and lips, buccal mucosa, and tongue normal  NECK: supple, thyroid normal size, non-tender, without nodularity LYMPH:  no palpable lymphadenopathy in the cervical, axillary or inguinal LUNGS: clear to auscultation and percussion with normal breathing effort HEART: regular rate & rhythm and no murmurs and no lower extremity edema ABDOMEN:abdomen soft, non-tender and normal bowel sounds Musculoskeletal:no cyanosis of digits and no clubbing  NEURO: alert & oriented x 3 with  fluent speech, no focal motor/sensory deficits  LABORATORY DATA:  I have reviewed the data as listed    Component Value Date/Time   NA 139 10/20/2014 1202   NA 134* 10/10/2014 0505   NA 142 09/02/2011 0936   K 4.0 10/20/2014 1202   K 3.5 10/10/2014 0505   K 4.1 09/02/2011 0936   CL 101 10/10/2014 0505   CL 106 01/29/2013 0900   CL 98 09/02/2011 0936   CO2 26 10/20/2014 1202   CO2 26 10/10/2014 0505   CO2 29 09/02/2011 0936   GLUCOSE 104 10/20/2014 1202   GLUCOSE 97 10/10/2014 0505   GLUCOSE 101* 01/29/2013 0900   GLUCOSE 96 09/02/2011 0936   BUN 22.1 10/20/2014 1202   BUN 28* 10/10/2014 0505   BUN 17 09/02/2011 0936   CREATININE 1.5* 10/20/2014 1202   CREATININE 1.60* 10/10/2014 0505   CREATININE 0.8 09/02/2011 0936   CALCIUM 8.6 10/20/2014 1202   CALCIUM 7.6* 10/10/2014 0505   CALCIUM 8.5 09/02/2011 0936   PROT 5.5* 10/20/2014 1202   PROT 5.4* 10/08/2014 1431   PROT 7.1 09/02/2011 0936   ALBUMIN 2.9* 10/20/2014 1202   ALBUMIN 2.7* 10/08/2014 1431   AST 15 10/20/2014 1202   AST 25 10/08/2014 1431   AST 21 09/02/2011 0936   ALT 8 10/20/2014 1202   ALT 21 10/08/2014 1431   ALT 17 09/02/2011 0936   ALKPHOS 106 10/20/2014 1202   ALKPHOS 95 10/08/2014 1431   ALKPHOS 72 09/02/2011 0936   BILITOT 0.59 10/20/2014 1202   BILITOT 0.8 10/08/2014 1431   BILITOT 0.60 09/02/2011 0936   GFRNONAA 39* 10/10/2014 0505   GFRAA 46* 10/10/2014 0505    No results found for: SPEP, UPEP  Lab Results  Component Value Date   WBC 10.4* 10/20/2014   NEUTROABS 5.9 10/20/2014   HGB 9.9* 10/20/2014   HCT 31.7* 10/20/2014   MCV 91.6 10/20/2014   PLT 284 10/20/2014      Chemistry      Component Value Date/Time   NA 139 10/20/2014 1202   NA 134* 10/10/2014 0505   NA 142 09/02/2011 0936   K 4.0 10/20/2014 1202   K 3.5 10/10/2014 0505   K 4.1 09/02/2011 0936   CL 101 10/10/2014 0505   CL 106 01/29/2013 0900   CL 98 09/02/2011 0936   CO2 26 10/20/2014 1202   CO2 26 10/10/2014  0505   CO2 29 09/02/2011 0936   BUN 22.1 10/20/2014 1202   BUN 28* 10/10/2014 0505   BUN 17 09/02/2011 0936   CREATININE 1.5* 10/20/2014 1202   CREATININE 1.60* 10/10/2014 0505   CREATININE 0.8 09/02/2011 0936      Component Value Date/Time   CALCIUM 8.6 10/20/2014 1202   CALCIUM 7.6* 10/10/2014 0505   CALCIUM 8.5 09/02/2011 0936   ALKPHOS 106 10/20/2014 1202   ALKPHOS 95 10/08/2014 1431   ALKPHOS 72 09/02/2011 0936   AST 15 10/20/2014 1202   AST 25 10/08/2014 1431   AST 21 09/02/2011 0936   ALT 8  10/20/2014 1202   ALT 21 10/08/2014 1431   ALT 17 09/02/2011 0936   BILITOT 0.59 10/20/2014 1202   BILITOT 0.8 10/08/2014 1431   BILITOT 0.60 09/02/2011 0936      ASSESSMENT & PLAN:  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 Continue same dose without adjustment. I plan to arrange for repeat imaging study in one month to assess response to treatment.    Anemia in neoplastic disease This is likely anemia of chronic disease, recent GI bleed and chronic kidney disease. The patient denies recent history of bleeding such as epistaxis, hematuria or hematochezia. He is asymptomatic from the anemia. We will observe for now.     Chronic kidney disease (CKD), stage II (mild) His renal failure is stable. He follows with nephrologist closely.    Orders Placed This Encounter  Procedures  . NM PET Image Restag (PS) Skull Base To Thigh    Standing Status: Future     Number of Occurrences:      Standing Expiration Date: 12/20/2015    Order Specific Question:  Reason for Exam (SYMPTOM  OR DIAGNOSIS REQUIRED)    Answer:  staging lymphoma, assess response to Rx    Order Specific Question:  Preferred imaging location?    Answer:  [facility]   All questions were answered. The patient knows to call the clinic with any problems, questions or concerns. No barriers to learning was detected. I spent 15 minutes  counseling the patient face to face. The total time spent in the appointment was 20 minutes and more than 50% was on counseling and review of test results     [facility], [place], MD 10/21/2014 2:19 PM

## 2014-10-21 NOTE — Assessment & Plan Note (Signed)
His renal failure is stable. He follows with nephrologist closely.

## 2014-10-21 NOTE — Assessment & Plan Note (Signed)
Clinically, he appears to be responding to iburtinib. Continue same dose without adjustment. I plan to arrange for repeat imaging study in one month to assess response to treatment.

## 2014-11-04 ENCOUNTER — Telehealth: Payer: Self-pay | Admitting: Cardiology

## 2014-11-04 NOTE — Telephone Encounter (Signed)
Closed encounter °

## 2014-11-04 NOTE — Telephone Encounter (Signed)
Make appt. For next week to see Dr. Percival Spanish

## 2014-11-09 ENCOUNTER — Ambulatory Visit: Payer: Medicare Other | Admitting: Cardiology

## 2014-11-09 ENCOUNTER — Telehealth: Payer: Self-pay | Admitting: Cardiology

## 2014-11-09 NOTE — Telephone Encounter (Signed)
Pt just saw Dr Warren Lacy in [place] wants to know why he is seeing Dr Warren Lacy tomorrow?

## 2014-11-09 NOTE — Telephone Encounter (Signed)
[name] is going to look into it

## 2014-11-10 ENCOUNTER — Ambulatory Visit: Payer: Medicare Other | Admitting: Cardiology

## 2014-11-14 ENCOUNTER — Telehealth: Payer: Self-pay | Admitting: Cardiology

## 2014-11-14 ENCOUNTER — Other Ambulatory Visit: Payer: Self-pay

## 2014-11-14 MED ORDER — ATENOLOL 25 MG PO TABS: 25.0000 mg | ORAL_TABLET | Freq: Every day | ORAL | Status: AC

## 2014-11-14 NOTE — Telephone Encounter (Signed)
°  1. Which medications need to be refilled? Atenolol Which pharmacy is medication to be sent to?[facility]-[phone]  3. Do they need a 30 day or 90 day supply? 90 and refills  4. Would they like a call back once the medication has been sent to the pharmacy? yes

## 2014-11-16 ENCOUNTER — Telehealth: Payer: Self-pay | Admitting: *Deleted

## 2014-11-16 ENCOUNTER — Ambulatory Visit (HOSPITAL_COMMUNITY)
Admission: RE | Admit: 2014-11-16 | Discharge: 2014-11-16 | Disposition: A | Discharge status: Home / Self Care | Payer: Medicare Other | Source: Ambulatory Visit | Attending: Hematology and Oncology | Admitting: Hematology and Oncology

## 2014-11-16 ENCOUNTER — Other Ambulatory Visit (HOSPITAL_BASED_OUTPATIENT_CLINIC_OR_DEPARTMENT_OTHER): Payer: Medicare Other

## 2014-11-16 ENCOUNTER — Ambulatory Visit (HOSPITAL_BASED_OUTPATIENT_CLINIC_OR_DEPARTMENT_OTHER): Payer: Medicare Other

## 2014-11-16 VITALS — BP 134/76 | HR 65 | Temp 97.8°F | Resp 18

## 2014-11-16 DIAGNOSIS — C8313 Mantle cell lymphoma, intra-abdominal lymph nodes: Secondary | ICD-10-CM | POA: Insufficient documentation

## 2014-11-16 DIAGNOSIS — Z452 Encounter for adjustment and management of vascular access device: Secondary | ICD-10-CM | POA: Diagnosis not present

## 2014-11-16 DIAGNOSIS — Z95828 Presence of other vascular implants and grafts: Secondary | ICD-10-CM

## 2014-11-16 DIAGNOSIS — D630 Anemia in neoplastic disease: Secondary | ICD-10-CM

## 2014-11-16 LAB — CBC WITH DIFFERENTIAL/PLATELET
BASO%: 0.7 % (ref 0.0–2.0)
Basophils Absolute: 0.1 10*3/uL (ref 0.0–0.1)
EOS%: 1.9 % (ref 0.0–7.0)
Eosinophils Absolute: 0.2 10*3/uL (ref 0.0–0.5)
HCT: 32.3 % — ABNORMAL LOW (ref 38.4–49.9)
HGB: 10.3 g/dL — ABNORMAL LOW (ref 13.0–17.1)
LYMPH%: 33.7 % (ref 14.0–49.0)
MCH: 28.4 pg (ref 27.2–33.4)
MCHC: 31.9 g/dL — ABNORMAL LOW (ref 32.0–36.0)
MCV: 89 fL (ref 79.3–98.0)
MONO#: 1 10*3/uL — ABNORMAL HIGH (ref 0.1–0.9)
MONO%: 10.7 % (ref 0.0–14.0)
NEUT#: 4.8 10*3/uL (ref 1.5–6.5)
NEUT%: 53 % (ref 39.0–75.0)
Platelets: 195 10*3/uL (ref 140–400)
RBC: 3.63 10*6/uL — ABNORMAL LOW (ref 4.20–5.82)
RDW: 15.6 % — ABNORMAL HIGH (ref 11.0–14.6)
WBC: 9.1 10*3/uL (ref 4.0–10.3)
lymph#: 3.1 10*3/uL (ref 0.9–3.3)
nRBC: 0 % (ref 0–0)

## 2014-11-16 LAB — COMPREHENSIVE METABOLIC PANEL (CC13)
ALT: <6 U/L (ref 0–55)
AST: 13 U/L (ref 5–34)
Albumin: 3 g/dL — ABNORMAL LOW (ref 3.5–5.0)
Alkaline Phosphatase: 109 U/L (ref 40–150)
Anion Gap: 13 mEq/L — ABNORMAL HIGH (ref 3–11)
BUN: 19.9 mg/dL (ref 7.0–26.0)
CO2: 27 mEq/L (ref 22–29)
Calcium: 8.4 mg/dL (ref 8.4–10.4)
Chloride: 102 mEq/L (ref 98–109)
Creatinine: 1.3 mg/dL (ref 0.7–1.3)
EGFR: 51 mL/min/{1.73_m2} — ABNORMAL LOW (ref >90)
Glucose: 97 mg/dl (ref 70–140)
Potassium: 3.2 mEq/L — ABNORMAL LOW (ref 3.5–5.1)
Sodium: 142 mEq/L (ref 136–145)
Total Bilirubin: 0.9 mg/dL (ref 0.20–1.20)
Total Protein: 5.6 g/dL — ABNORMAL LOW (ref 6.4–8.3)

## 2014-11-16 LAB — HOLD TUBE, BLOOD BANK

## 2014-11-16 MED ORDER — SODIUM CHLORIDE 0.9 % IJ SOLN
10.0000 mL | INTRAMUSCULAR | Status: DC | PRN
  Administered 2014-11-16: 10 mL via INTRAVENOUS
  Filled 2014-11-16: qty 10

## 2014-11-16 MED ORDER — FLUDEOXYGLUCOSE F - 18 (FDG) INJECTION
9.5000 | Freq: Once | INTRAVENOUS | Status: AC | PRN
  Administered 2014-11-16: 9.5 via INTRAVENOUS

## 2014-11-16 NOTE — Patient Instructions (Signed)

## 2014-11-16 NOTE — Telephone Encounter (Signed)
-----   Message from Heath Lark, MD sent at 11/16/2014 12:05 PM EDT ----- Regarding: no need blood No need blood and tell wife to hold lasix due to low K ----- Message -----    From: [facility] Interface    Sent: 11/16/2014  11:23 AM      To: Heath Lark, MD

## 2014-11-16 NOTE — Telephone Encounter (Signed)
Informed wife of low K+ and to hold lasix per Dr. Alvy Bimler.  Advised her to discuss this on his visit w/ her tomorrrow at 11:15 am.  She verbalized understanding.

## 2014-11-17 ENCOUNTER — Encounter: Payer: Self-pay | Admitting: Hematology and Oncology

## 2014-11-17 ENCOUNTER — Ambulatory Visit (HOSPITAL_BASED_OUTPATIENT_CLINIC_OR_DEPARTMENT_OTHER): Payer: Medicare Other | Admitting: Hematology and Oncology

## 2014-11-17 ENCOUNTER — Telehealth: Payer: Self-pay | Admitting: Hematology and Oncology

## 2014-11-17 VITALS — BP 107/53 | HR 60 | Temp 97.5°F | Resp 21 | Ht 69.0 in | Wt 192.7 lb

## 2014-11-17 DIAGNOSIS — N182 Chronic kidney disease, stage 2 (mild): Secondary | ICD-10-CM | POA: Diagnosis not present

## 2014-11-17 DIAGNOSIS — C8313 Mantle cell lymphoma, intra-abdominal lymph nodes: Secondary | ICD-10-CM | POA: Diagnosis not present

## 2014-11-17 DIAGNOSIS — R05 Cough: Secondary | ICD-10-CM

## 2014-11-17 DIAGNOSIS — R600 Localized edema: Secondary | ICD-10-CM

## 2014-11-17 DIAGNOSIS — R059 Cough, unspecified: Secondary | ICD-10-CM

## 2014-11-17 DIAGNOSIS — D630 Anemia in neoplastic disease: Secondary | ICD-10-CM

## 2014-11-17 LAB — GLUCOSE, CAPILLARY: GLUCOSE-CAPILLARY: 101 mg/dL — AB (ref 70–99)

## 2014-11-17 MED ORDER — GUAIFENESIN-CODEINE 100-10 MG/5ML PO SOLN: 5.0000 mL | Freq: Three times a day (TID) | ORAL | Status: DC | PRN

## 2014-11-17 NOTE — Telephone Encounter (Signed)
gave and printed appt sched and avs fo rpt for May °

## 2014-11-17 NOTE — Assessment & Plan Note (Signed)
This is likely multifactorial from a reduce mobility, fluid retention, low albumin and chronic kidney disease. Overall, it is stable. He will continue leg elevation and diuretic therapy.

## 2014-11-17 NOTE — Assessment & Plan Note (Signed)
He has mild productive cough.  PET/CT scan imaging studies show possible recent pneumonia. Clinically, he does not appear to be infected. I gave him some cough syrup to take. I recommend close observation only at this point. He has received multiple courses of antibiotic treatment.

## 2014-11-17 NOTE — Assessment & Plan Note (Signed)
Clinically, he appears to be responding to iburtinib. Continue same dose without adjustment. I plan to arrange for repeat imaging study in 3 months to assess response to treatment.

## 2014-11-17 NOTE — Progress Notes (Signed)
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 Summary of his history is as follows: He was diagnosed with Mantle cell lymphoma diagnosed in March 2008 with positive bone marrow initially on 12/03/2006 and then negative bone marrow after treatment in October 2009. The patient presented with obstructive liver abnormalities requiring ERCP and non metal stent placement by Dr. Erskine Emery. The stent was subsequently removed in October 2008.  The patient was treated with 8 cycles of Rituxan, Cytoxan, vincristine and Decadron in combination with Neulasta from 12/19/2006 through 05/15/2007. The patient then received maintenance Rituxan from July 16, 2007 through February 01, 2010. While on Rituxan, he had a recurrence of his disease by CT scan carried out on 01/24/2010. Mr. Mindel then received treatment with bendamustine and Rituxan in combination with Neulasta for 6 cycles from 02/19/2010 through 07/26/2010. PET scan from 08/22/2010 and CT scans of chest, abdomen, and pelvis from 01/01/2011 that showed no evidence of disease. He had been off treatment since December 2011 and had been doing well without any symptoms until the CT scan of the abdomen and pelvis on 07/01/2011 showed signs of recurrence. CT scans of abdomen and pelvis with IVC on 09/02/11 showed further progression. Rituxan, subcutaneous Velcade, intravenous Cytoxan and Decadron were initiated on 10/15/2011. CT scans of the  abdomen and pelvis carried out on 01/08/2012 showed a near complete response to therapy. CT scan of the abdomen and pelvis with IV contrast carried out on 06/19/2012 showed stable plaque-like soft tissue density in the upper abdominal retroperitoneum compared with the CT scan of 01/08/2012. CT scan of the abdomen and pelvis with IV contrast carried out on 11/30/2012 showed no evidence of recurrent lymphoma.  As of 08/05/2013, treatment program will be as follows:  -Velcade 2.75 mg subcu.  -Cytoxan 400 mg IV.  -Decadron 20 mg IV.  The above drugs will be given every 3 weeks.  -Rituxan 800 mg IV every 6 weeks.  Previously Velcade, Cytoxan, and Decadron were being administered every 2 weeks, and Rituxan was being administered every 4 weeks. (from 12/04/2012 - 08/05/2013). Prior to that  beginning on 10/15/2011 - 12/04/2012, Velcade, Cytoxan, and Decadron were being administered weekly, and Rituxan was being administered every 2 weeks. In August of 2014, he was scheduled  for chemotherapy but had chest pain and it was held. He underwent a cardiac cath which revealed two of three native vessel ischemic disease on 04/13/2013 but he was a poor surgical candidate for a re-do CABG. His therapy was re-started in October 2014.   Overall he's tolerated his treatment without difficulty.  In April he had an accidental fall resulting in a left hip fracture.  He underwent L THR on 12/09/2013 by Dr. Paralee Cancel.  He was discharged to rehab.  Prior to his fall, he had restaging scans including CT C/A/P on 04/09 which demonstrated absence of recurrent lymphoma in the chest, abdomen or pelvis.  Repeat PET/CT scan on 07/27/2014 show significant disease progression. Rituximab was discontinued  The patient was admitted to  the hospital from 08/02/2014 to 08/12/2014 due to significant GI bleed from malignant tumor in his stomach that was cauterized , biopsy proven to be recurrence of lymphoma.  He was in the intensive care unit  due to hemorrhagic shock requiring significant blood transfusion. The patient also developed mild acute encephalopathy with minor neurological deficit which subsequently resolved. Between 08/28/2014 to 09/02/2014, he was admitted to the hospital with acute on chronic heart failure, chronic kidney disease and Escherichia coli sepsis On 09/14/2014, he was started on Ibrutinib.  INTERVAL HISTORY: Please see below for problem oriented charting.  his wife reported the patient continues to have weakness and intermittent confusion. He has productive cough but no fevers or chills. He has reduced mobility. He had persistent chronic bilateral lower extremity edema. Denies recent diarrhea. The patient denies any recent signs or symptoms of bleeding such as spontaneous epistaxis, hematuria or hematochezia.   REVIEW OF SYSTEMS:   Constitutional: Denies fevers, chills or abnormal weight loss Eyes: Denies blurriness of vision Ears, nose, mouth, throat, and face: Denies mucositis or sore throat Cardiovascular: Denies palpitation, chest discomfort  Gastrointestinal:  Denies nausea, heartburn or change in bowel habits Skin: Denies abnormal skin rashes Lymphatics: Denies new lymphadenopathy or easy bruising Neurological:Denies numbness, tingling or new weaknesses Behavioral/Psych: Mood is stable, no new changes  All other systems were reviewed with the patient and are negative.  I have reviewed the past medical history, past surgical history, social history and family history with the patient and they are unchanged from previous note.  ALLERGIES:  is allergic to atorvastatin.  MEDICATIONS:  Current Outpatient Prescriptions  Medication Sig Dispense Refill  . amLODipine (NORVASC) 5 MG tablet Take 1 tablet (5 mg total) by mouth daily. 30 tablet 11  . atenolol (TENORMIN) 25 MG tablet Take 1 tablet (25 mg total) by mouth daily. 30 tablet 11  . cephALEXin (KEFLEX) 500 MG capsule Take 500 mg by mouth daily.     . citalopram (CELEXA) 20 MG tablet TAKE ONE TABLET BY MOUTH ONCE DAILY 30 tablet 2  . furosemide (LASIX) 40 MG tablet Take 40 mg by mouth daily. Pt takes 1/2 tablet (20 mg )    . HYDROcodone-acetaminophen (NORCO/VICODIN) 5-325 MG per tablet Take 1-2 tablets by mouth every 6 (six) hours as needed for moderate pain. (Patient taking differently: Take 1-2 tablets by mouth as needed for moderate pain. ) 100 tablet 0  . ibrutinib (IMBRUVICA) 140 MG capsul Take 4 capsules (560 mg total) by mouth daily. 120 capsule 9  . isosorbide mononitrate (IMDUR) 30 MG 24 hr tablet Take 3 tablets (90 mg total) by mouth daily. 90 tablet 5  . lactose free nutrition (BOOST PLUS) LIQD Take 237 mLs by mouth 2 (two) times daily as needed (Allow w/pt or pt's wife request after diet advancement). 60 Can 0  . methocarbamol (ROBAXIN) 500 MG tablet Take 1 tablet (500 mg total) by mouth every 6 (six) hours as needed for muscle spasms. 50 tablet 0  . nitroGLYCERIN (NITROSTAT) 0.4 MG SL tablet Place 1 tablet (0.4 mg total) under the tongue every 5 (five) minutes as needed for chest pain. 25 tablet prn  . pantoprazole (PROTONIX) 40 MG tablet Take 1 tablet (40 mg total) by mouth 2 (two) times daily. 60 tablet 2  . pravastatin (PRAVACHOL) 80 MG tablet Take 1 tablet (80 mg total) by mouth daily. 30 tablet 11  . guaiFENesin-codeine 100-10 MG/5ML syrup Take 5 mLs by mouth 3 (three) times daily as needed for  cough. 180 mL 1   No current facility-administered medications for this visit.    PHYSICAL EXAMINATION: ECOG PERFORMANCE STATUS: 2 - Symptomatic, <50% confined to bed  Filed Vitals:   11/17/14 1104  BP: 107/53  Pulse: 60  Temp: 97.5 F (36.4 C)  Resp: 21   Filed Weights   11/17/14 1104  Weight: 192 lb 11.2 oz (87.408 kg)    GENERAL:alert, no distress and comfortable SKIN: skin color, texture, turgor are normal, no rashes or significant lesions EYES: normal, Conjunctiva are pink and non-injected, sclera  clear OROPHARYNX:no exudate, no erythema and lips, buccal mucosa, and tongue normal  NECK: supple, thyroid normal size, non-tender, without nodularity LYMPH:  no palpable lymphadenopathy in the cervical, axillary or inguinal LUNGS:  Bilateral lower basis crackles but no wheezes. HEART: regular rate & rhythm and no murmurs  With moderate bilateral lower extremity edema ABDOMEN:abdomen soft, non-tender and normal bowel sounds Musculoskeletal:no cyanosis of digits and no clubbing  NEURO: alert & oriented x 3 with fluent speech, no focal motor/sensory deficits  LABORATORY DATA:  I have reviewed the data as listed    Component Value Date/Time   NA 142 11/16/2014 1014   NA 134* 10/10/2014 0505   NA 142 09/02/2011 0936   K 3.2* 11/16/2014 1014   K 3.5 10/10/2014 0505   K 4.1 09/02/2011 0936   CL 101 10/10/2014 0505   CL 106 01/29/2013 0900   CL 98 09/02/2011 0936   CO2 27 11/16/2014 1014   CO2 26 10/10/2014 0505   CO2 29 09/02/2011 0936   GLUCOSE 97 11/16/2014 1014   GLUCOSE 97 10/10/2014 0505   GLUCOSE 101* 01/29/2013 0900   GLUCOSE 96 09/02/2011 0936   BUN 19.9 11/16/2014 1014   BUN 28* 10/10/2014 0505   BUN 17 09/02/2011 0936   CREATININE 1.3 11/16/2014 1014   CREATININE 1.60* 10/10/2014 0505   CREATININE 0.8 09/02/2011 0936   CALCIUM 8.4 11/16/2014 1014   CALCIUM 7.6* 10/10/2014 0505   CALCIUM 8.5 09/02/2011 0936   PROT 5.6* 11/16/2014 1014   PROT 5.4* 10/08/2014 1431   PROT 7.1 09/02/2011 0936   ALBUMIN 3.0* 11/16/2014 1014   ALBUMIN 2.7* 10/08/2014 1431   AST 13 11/16/2014 1014   AST 25 10/08/2014 1431   AST 21 09/02/2011 0936   ALT <6 11/16/2014 1014   ALT 21 10/08/2014 1431   ALT 17 09/02/2011 0936   ALKPHOS 109 11/16/2014 1014   ALKPHOS 95 10/08/2014 1431   ALKPHOS 72 09/02/2011 0936   BILITOT 0.90 11/16/2014 1014   BILITOT 0.8 10/08/2014 1431   BILITOT 0.60 09/02/2011 0936   GFRNONAA 39* 10/10/2014 0505   GFRAA 46* 10/10/2014 0505    No results found  for: SPEP, UPEP  Lab Results  Component Value Date   WBC 9.1 11/16/2014   NEUTROABS 4.8 11/16/2014   HGB 10.3* 11/16/2014   HCT 32.3* 11/16/2014   MCV 89.0 11/16/2014   PLT 195 11/16/2014      Chemistry      Component Value Date/Time   NA 142 11/16/2014 1014   NA 134* 10/10/2014 0505   NA 142 09/02/2011 0936   K 3.2* 11/16/2014 1014   K 3.5 10/10/2014 0505   K 4.1 09/02/2011 0936   CL 101 10/10/2014 0505   CL 106 01/29/2013 0900   CL 98 09/02/2011 0936   CO2 27 11/16/2014 1014   CO2 26 10/10/2014 0505   CO2 29 09/02/2011 0936   BUN 19.9 11/16/2014 1014  BUN 28* 10/10/2014 0505   BUN 17 09/02/2011 0936   CREATININE 1.3 11/16/2014 1014   CREATININE 1.60* 10/10/2014 0505   CREATININE 0.8 09/02/2011 0936      Component Value Date/Time   CALCIUM 8.4 11/16/2014 1014   CALCIUM 7.6* 10/10/2014 0505   CALCIUM 8.5 09/02/2011 0936   ALKPHOS 109 11/16/2014 1014   ALKPHOS 95 10/08/2014 1431   ALKPHOS 72 09/02/2011 0936   AST 13 11/16/2014 1014   AST 25 10/08/2014 1431   AST 21 09/02/2011 0936   ALT <6 11/16/2014 1014   ALT 21 10/08/2014 1431   ALT 17 09/02/2011 0936   BILITOT 0.90 11/16/2014 1014   BILITOT 0.8 10/08/2014 1431   BILITOT 0.60 09/02/2011 0936       RADIOGRAPHIC STUDIES: I have personally reviewed the radiological images as listed and agreed with the findings in the report. Nm Pet Image Restag (ps) Skull Base To Thigh  11/17/2014   CLINICAL DATA:  Initial treatment strategy for lymphoma.  EXAM: NUCLEAR MEDICINE PET SKULL BASE TO THIGH  TECHNIQUE: 9.5 mCi F-18 FDG was injected intravenously. Full-ring PET imaging was performed from the skull base to thigh after the radiotracer. CT data was obtained and used for attenuation correction and anatomic localization.  FASTING BLOOD GLUCOSE:  Value: 101 mg/dl  COMPARISON:  07/27/2014  FINDINGS: NECK  Intense metabolic activity within the floor the mouth and tongue. There is some artifact from amalgam at this level on  the CT portion. Activity primarily involves the anterior tongue.  There is foci of metabolic activity in soft tissues superficial to the right mandible (image 26). This activity is intense.  There is decreased activity in the posterior pharyngeal lymphoid tissue.  There is linear muscle activity in the left anterior neck  CHEST  Interval decrease in the nodular pleural metabolic thickening in the left lower lobe which is now near completely resolved. There is a focus of peripheral metabolic activity in the upper lobe (image 35) of the fused data set with SUV max equal 5.5. Adjacent focus on the same image which is smaller. There is mild pleural nodularity associated these lesions which are new from prior. Similar nodular focus in the peripheral anterior right upper lobe is new from prior (image 66 of fused data set  ABDOMEN/PELVIS  Interval decrease in the metabolic activity associated the stomach. There are decreased in metabolic activity of the gastrohepatic ligament lymph node.  No hypermetabolic nodes identified abdomen or pelvis  SKELETON  No focal hypermetabolic activity to suggest skeletal metastasis.  IMPRESSION: 1. Mixed response in the thorax. Several of the hypermetabolic pleural lesions have decreased in size and metabolic activity while there several new peripheral hypermetabolic pleural-based nodules. Suspected teh new lesion may be infectious or inflammatory. 2. Intense metabolic activity within the floor of the mouth and tongue as well as the soft tissues superficial to the right anterior mandible. This is indeterminate. Recommend direct visualization. 3. Decrease in metabolic activity in the posterior oral pharynx lymphoid tissue. 4. Decrease in metabolic activity associated the stomach and gastrohepatic ligament lymph node. These results will be called to the ordering clinician or representative by the Radiologist Assistant, and communication documented in the PACS or zVision Dashboard.    Electronically Signed   By: Suzy Bouchard M.D.   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 Continue same dose without adjustment. I plan  to arrange for repeat imaging study in 3 months to assess response to treatment.      Anemia in neoplastic disease This is likely anemia of chronic disease, recent GI bleed and chronic kidney disease. The patient denies recent history of bleeding such as epistaxis, hematuria or hematochezia. He is asymptomatic from the anemia. We will observe for now.     Chronic kidney disease (CKD), stage II (mild) His renal failure is stable. He follows with nephrologist closely.   Cough  He has mild productive cough.  PET/CT scan imaging studies show possible recent pneumonia. Clinically, he does not appear to be infected. I gave him some cough syrup to take. I recommend close observation only at this point. He has received multiple courses of antibiotic treatment.   Bilateral leg edema  This is likely multifactorial from a reduce mobility, fluid retention, low albumin and chronic kidney disease. Overall, it is stable. He will continue leg elevation and diuretic therapy.   The patient also has abnormal activity noted in the mouth and oropharynx. The patient is noted to have poor dentition. Overall, I do not believe this is compatible with lymphoma. I recommend observation only. All questions were answered. The patient knows to call the clinic with any problems, questions or concerns. No barriers to learning was detected. I spent 30 minutes counseling the patient face to face. The total time spent in the appointment was 40 minutes and more than 50% was on counseling and review of test results     [facility], [place], MD 11/17/2014 11:36 AM

## 2014-11-17 NOTE — Assessment & Plan Note (Signed)
This is likely anemia of chronic disease, recent GI bleed and chronic kidney disease. The patient denies recent history of bleeding such as epistaxis, hematuria or hematochezia. He is asymptomatic from the anemia. We will observe for now.

## 2014-11-17 NOTE — Assessment & Plan Note (Signed)
His renal failure is stable. He follows with nephrologist closely.

## 2014-11-18 ENCOUNTER — Telehealth: Payer: Self-pay | Admitting: *Deleted

## 2014-11-18 NOTE — Telephone Encounter (Signed)
Wife says pt has been holding his lasix since 3/23 as pt's potassium was low.  Wife forgot to ask Dr. Alvy Bimler on visit yesterday if ok for pt to resume lasix?  Pt was taking 1/2 tablet daily.  Wife says pt was on potassium pills in the past but then his potassium level went too high so they were d/c'd.   She is concerned about pt getting too swollen and getting "fluid around his heart" w/o the lasix.  Instructed wife to resume the lasix tomorrow and Sunday.  Nurse will call her back on Monday w/ Dr. Calton Dach response.   She verbalized understanding.

## 2014-11-21 ENCOUNTER — Telehealth: Payer: Self-pay | Admitting: *Deleted

## 2014-11-21 NOTE — Telephone Encounter (Signed)
Informed wife ok to resume lasix per Dr. Alvy Bimler. She verbalized understanding.

## 2014-11-21 NOTE — Telephone Encounter (Signed)
Yes, OK to resume

## 2014-11-23 ENCOUNTER — Other Ambulatory Visit: Payer: Self-pay | Admitting: *Deleted

## 2014-11-23 DIAGNOSIS — C169 Malignant neoplasm of stomach, unspecified: Secondary | ICD-10-CM

## 2014-11-23 DIAGNOSIS — C8313 Mantle cell lymphoma, intra-abdominal lymph nodes: Secondary | ICD-10-CM

## 2014-11-23 MED ORDER — PANTOPRAZOLE SODIUM 40 MG PO TBEC: 40.0000 mg | DELAYED_RELEASE_TABLET | Freq: Two times a day (BID) | ORAL | Status: DC

## 2014-12-12 ENCOUNTER — Telehealth: Payer: Self-pay | Admitting: *Deleted

## 2014-12-12 ENCOUNTER — Other Ambulatory Visit: Payer: Self-pay | Admitting: Hematology and Oncology

## 2014-12-12 DIAGNOSIS — C8313 Mantle cell lymphoma, intra-abdominal lymph nodes: Secondary | ICD-10-CM

## 2014-12-12 MED ORDER — HYDROCODONE-ACETAMINOPHEN 5-325 MG PO TABS: 1.0000 | ORAL_TABLET | Freq: Four times a day (QID) | ORAL | Status: DC | PRN

## 2014-12-12 NOTE — Telephone Encounter (Signed)
Informed wife of Rx ready to pick up.  She states she has been feeling sick lately and may have to send her daughter to pick up rx.   Informed her it will be ready.

## 2014-12-12 NOTE — Telephone Encounter (Signed)
Wife requests refill on pain meds for pt.  He has ongoing headaches for which he takes hydrocodone up to twice a day sometimes every day.  Wife says last Rx was written by Dr. Juliann Mule.  Pt has not seen his PCP in a very long time and wife asks if Dr. Alvy Bimler will refill?

## 2014-12-14 ENCOUNTER — Observation Stay (HOSPITAL_COMMUNITY)
Admission: EM | Admit: 2014-12-14 | Discharge: 2014-12-17 | Disposition: A | Discharge status: Home / Self Care | Payer: Medicare Other | Attending: Internal Medicine | Admitting: Internal Medicine

## 2014-12-14 ENCOUNTER — Emergency Department (HOSPITAL_COMMUNITY): Payer: Medicare Other

## 2014-12-14 ENCOUNTER — Encounter (HOSPITAL_COMMUNITY): Payer: Self-pay | Admitting: Emergency Medicine

## 2014-12-14 DIAGNOSIS — I252 Old myocardial infarction: Secondary | ICD-10-CM | POA: Diagnosis not present

## 2014-12-14 DIAGNOSIS — I739 Peripheral vascular disease, unspecified: Secondary | ICD-10-CM | POA: Insufficient documentation

## 2014-12-14 DIAGNOSIS — I129 Hypertensive chronic kidney disease with stage 1 through stage 4 chronic kidney disease, or unspecified chronic kidney disease: Secondary | ICD-10-CM | POA: Diagnosis not present

## 2014-12-14 DIAGNOSIS — Z87442 Personal history of urinary calculi: Secondary | ICD-10-CM | POA: Diagnosis not present

## 2014-12-14 DIAGNOSIS — I5033 Acute on chronic diastolic (congestive) heart failure: Principal | ICD-10-CM | POA: Insufficient documentation

## 2014-12-14 DIAGNOSIS — J9601 Acute respiratory failure with hypoxia: Secondary | ICD-10-CM | POA: Insufficient documentation

## 2014-12-14 DIAGNOSIS — E876 Hypokalemia: Secondary | ICD-10-CM | POA: Diagnosis not present

## 2014-12-14 DIAGNOSIS — Z85238 Personal history of other malignant neoplasm of thymus: Secondary | ICD-10-CM | POA: Insufficient documentation

## 2014-12-14 DIAGNOSIS — E785 Hyperlipidemia, unspecified: Secondary | ICD-10-CM | POA: Diagnosis not present

## 2014-12-14 DIAGNOSIS — Z8673 Personal history of transient ischemic attack (TIA), and cerebral infarction without residual deficits: Secondary | ICD-10-CM | POA: Insufficient documentation

## 2014-12-14 DIAGNOSIS — D630 Anemia in neoplastic disease: Secondary | ICD-10-CM | POA: Diagnosis not present

## 2014-12-14 DIAGNOSIS — J209 Acute bronchitis, unspecified: Secondary | ICD-10-CM | POA: Insufficient documentation

## 2014-12-14 DIAGNOSIS — Z66 Do not resuscitate: Secondary | ICD-10-CM | POA: Diagnosis not present

## 2014-12-14 DIAGNOSIS — E669 Obesity, unspecified: Secondary | ICD-10-CM | POA: Diagnosis not present

## 2014-12-14 DIAGNOSIS — I10 Essential (primary) hypertension: Secondary | ICD-10-CM | POA: Diagnosis present

## 2014-12-14 DIAGNOSIS — I509 Heart failure, unspecified: Secondary | ICD-10-CM | POA: Diagnosis not present

## 2014-12-14 DIAGNOSIS — C8318 Mantle cell lymphoma, lymph nodes of multiple sites: Secondary | ICD-10-CM | POA: Diagnosis present

## 2014-12-14 DIAGNOSIS — I2510 Atherosclerotic heart disease of native coronary artery without angina pectoris: Secondary | ICD-10-CM | POA: Diagnosis not present

## 2014-12-14 DIAGNOSIS — C8313 Mantle cell lymphoma, intra-abdominal lymph nodes: Secondary | ICD-10-CM | POA: Insufficient documentation

## 2014-12-14 DIAGNOSIS — R0602 Shortness of breath: Secondary | ICD-10-CM | POA: Diagnosis present

## 2014-12-14 DIAGNOSIS — Z951 Presence of aortocoronary bypass graft: Secondary | ICD-10-CM | POA: Insufficient documentation

## 2014-12-14 DIAGNOSIS — N182 Chronic kidney disease, stage 2 (mild): Secondary | ICD-10-CM | POA: Insufficient documentation

## 2014-12-14 LAB — PROCALCITONIN: Procalcitonin: <0.1 ng/mL

## 2014-12-14 LAB — CBC WITH DIFFERENTIAL/PLATELET
Basophils Absolute: 0.1 10*3/uL (ref 0.0–0.1)
Basophils Relative: 1 % (ref 0–1)
EOS ABS: 0.1 10*3/uL (ref 0.0–0.7)
Eosinophils Relative: 1 % (ref 0–5)
HCT: 32.2 % — ABNORMAL LOW (ref 39.0–52.0)
Hemoglobin: 10.1 g/dL — ABNORMAL LOW (ref 13.0–17.0)
LYMPHS PCT: 40 % (ref 12–46)
Lymphs Abs: 2 10*3/uL (ref 0.7–4.0)
MCH: 27.7 pg (ref 26.0–34.0)
MCHC: 31.4 g/dL (ref 30.0–36.0)
MCV: 88.2 fL (ref 78.0–100.0)
MONOS PCT: 33 % — AB (ref 3–12)
Monocytes Absolute: 1.8 10*3/uL — ABNORMAL HIGH (ref 0.1–1.0)
NEUTROS PCT: 25 % — AB (ref 43–77)
Neutro Abs: 1.3 10*3/uL — ABNORMAL LOW (ref 1.7–7.7)
Platelets: 171 10*3/uL (ref 150–400)
RBC: 3.65 MIL/uL — AB (ref 4.22–5.81)
RDW: 15.4 % (ref 11.5–15.5)
WBC: 5.3 10*3/uL (ref 4.0–10.5)

## 2014-12-14 LAB — BASIC METABOLIC PANEL
ANION GAP: 8 (ref 5–15)
BUN: 24 mg/dL — ABNORMAL HIGH (ref 6–23)
CO2: 28 mmol/L (ref 19–32)
Calcium: 8.3 mg/dL — ABNORMAL LOW (ref 8.4–10.5)
Chloride: 102 mmol/L (ref 96–112)
Creatinine, Ser: 1.73 mg/dL — ABNORMAL HIGH (ref 0.50–1.35)
GFR calc Af Amer: 41 mL/min — ABNORMAL LOW (ref >90)
GFR calc non Af Amer: 36 mL/min — ABNORMAL LOW (ref >90)
Glucose, Bld: 96 mg/dL (ref 70–99)
Potassium: 3.4 mmol/L — ABNORMAL LOW (ref 3.5–5.1)
SODIUM: 138 mmol/L (ref 135–145)

## 2014-12-14 LAB — BRAIN NATRIURETIC PEPTIDE: B Natriuretic Peptide: 125.3 pg/mL — ABNORMAL HIGH (ref 0.0–100.0)

## 2014-12-14 LAB — I-STAT CG4 LACTIC ACID, ED: Lactic Acid, Venous: 0.59 mmol/L (ref 0.5–2.0)

## 2014-12-14 LAB — TROPONIN I

## 2014-12-14 MED ORDER — SODIUM CHLORIDE 0.9 % IJ SOLN
3.0000 mL | Freq: Two times a day (BID) | INTRAMUSCULAR | Status: DC
  Administered 2014-12-14 – 2014-12-15 (×2): 3 mL via INTRAVENOUS

## 2014-12-14 MED ORDER — SODIUM CHLORIDE 0.9 % IJ SOLN: 3.0000 mL | INTRAMUSCULAR | Status: DC | PRN

## 2014-12-14 MED ORDER — ACETAMINOPHEN 325 MG PO TABS: 650.0000 mg | ORAL_TABLET | ORAL | Status: DC | PRN

## 2014-12-14 MED ORDER — PRAVASTATIN SODIUM 40 MG PO TABS
80.0000 mg | ORAL_TABLET | Freq: Every day | ORAL | Status: DC
Start: 2014-12-14 — End: 2014-12-17
  Administered 2014-12-15: 80 mg via ORAL
  Filled 2014-12-14 (×3): qty 2

## 2014-12-14 MED ORDER — BOOST PLUS PO LIQD
237.0000 mL | Freq: Two times a day (BID) | ORAL | Status: DC | PRN
  Filled 2014-12-14: qty 237

## 2014-12-14 MED ORDER — CITALOPRAM HYDROBROMIDE 20 MG PO TABS
20.0000 mg | ORAL_TABLET | Freq: Every day | ORAL | Status: DC
  Administered 2014-12-15 – 2014-12-17 (×3): 20 mg via ORAL
  Filled 2014-12-14 (×3): qty 1

## 2014-12-14 MED ORDER — ACETAMINOPHEN 325 MG PO TABS
650.0000 mg | ORAL_TABLET | Freq: Once | ORAL | Status: AC
  Administered 2014-12-14: 650 mg via ORAL
  Filled 2014-12-14: qty 2

## 2014-12-14 MED ORDER — IPRATROPIUM-ALBUTEROL 0.5-2.5 (3) MG/3ML IN SOLN
3.0000 mL | Freq: Once | RESPIRATORY_TRACT | Status: AC
  Administered 2014-12-14: 3 mL via RESPIRATORY_TRACT
  Filled 2014-12-14: qty 3

## 2014-12-14 MED ORDER — PANTOPRAZOLE SODIUM 40 MG PO TBEC
40.0000 mg | DELAYED_RELEASE_TABLET | Freq: Two times a day (BID) | ORAL | Status: DC
Start: 2014-12-14 — End: 2014-12-17
  Administered 2014-12-14 – 2014-12-17 (×6): 40 mg via ORAL
  Filled 2014-12-14 (×6): qty 1

## 2014-12-14 MED ORDER — NITROGLYCERIN 0.4 MG SL SUBL: 0.4000 mg | SUBLINGUAL_TABLET | SUBLINGUAL | Status: DC | PRN

## 2014-12-14 MED ORDER — ISOSORBIDE MONONITRATE ER 30 MG PO TB24
90.0000 mg | ORAL_TABLET | Freq: Every day | ORAL | Status: DC
  Administered 2014-12-15 – 2014-12-17 (×3): 90 mg via ORAL
  Filled 2014-12-14 (×4): qty 3

## 2014-12-14 MED ORDER — FUROSEMIDE 10 MG/ML IJ SOLN
40.0000 mg | Freq: Two times a day (BID) | INTRAMUSCULAR | Status: DC
  Administered 2014-12-15 – 2014-12-16 (×3): 40 mg via INTRAVENOUS
  Filled 2014-12-14 (×3): qty 4

## 2014-12-14 MED ORDER — ATENOLOL 25 MG PO TABS
25.0000 mg | ORAL_TABLET | Freq: Every day | ORAL | Status: DC
  Administered 2014-12-15 – 2014-12-16 (×2): 25 mg via ORAL
  Filled 2014-12-14 (×2): qty 1

## 2014-12-14 MED ORDER — CEPHALEXIN 500 MG PO CAPS: 500.0000 mg | ORAL_CAPSULE | Freq: Every day | ORAL | Status: DC

## 2014-12-14 MED ORDER — SODIUM CHLORIDE 0.9 % IV SOLN: 250.0000 mL | INTRAVENOUS | Status: DC | PRN

## 2014-12-14 MED ORDER — HYDROCODONE-ACETAMINOPHEN 5-325 MG PO TABS: 1.0000 | ORAL_TABLET | Freq: Four times a day (QID) | ORAL | Status: DC | PRN

## 2014-12-14 MED ORDER — ONDANSETRON HCL 4 MG/2ML IJ SOLN: 4.0000 mg | Freq: Four times a day (QID) | INTRAMUSCULAR | Status: DC | PRN

## 2014-12-14 MED ORDER — ENOXAPARIN SODIUM 30 MG/0.3ML ~~LOC~~ SOLN
30.0000 mg | Freq: Every day | SUBCUTANEOUS | Status: DC
  Administered 2014-12-14: 30 mg via SUBCUTANEOUS
  Filled 2014-12-14: qty 0.3

## 2014-12-14 MED ORDER — GUAIFENESIN-CODEINE 100-10 MG/5ML PO SOLN: 5.0000 mL | Freq: Three times a day (TID) | ORAL | Status: DC | PRN

## 2014-12-14 MED ORDER — FUROSEMIDE 10 MG/ML IJ SOLN
40.0000 mg | Freq: Once | INTRAMUSCULAR | Status: AC
  Administered 2014-12-14: 40 mg via INTRAVENOUS
  Filled 2014-12-14: qty 4

## 2014-12-14 MED ORDER — IBRUTINIB 140 MG PO CAPS
560.0000 mg | ORAL_CAPSULE | Freq: Every day | ORAL | Status: DC
  Administered 2014-12-16 – 2014-12-17 (×2): 560 mg via ORAL

## 2014-12-14 NOTE — ED Notes (Signed)
On initial assessment of change of shift, patient has right anterior chest port-a-cath that has been assessed by previous nurse.

## 2014-12-14 NOTE — ED Notes (Signed)
Bed: WA08 Expected date:  Expected time:  Means of arrival:  Comments: Hold ems

## 2014-12-14 NOTE — H&P (Addendum)
PCP:  [name], MD  Oncology [facility] [place]  Chief Complaint:  Shortness of breath and leg edema cough and sore throat He was brought in from his PCP office  HPI: Martin Morris is a 81 y.o. male   has a past medical history of Hypertension; CAD (11/12/2007); HYPERLIPIDEMIA (11/12/2007); HYPERTENSION (11/12/2007); PERIPHERAL VASCULAR DISEASE (11/12/2007); ARTHRITIS (11/12/2007); BENIGN PROSTATIC HYPERTROPHY, HX OF (11/12/2007); Edema (10/17/2010); INGUINAL HERNIAS, BILATERAL (11/10/2006); Obesity, unspecified (07/05/2009); SLEEP APNEA (11/12/2007); History of PTCA (2005); Osteoarthritis; Carotid stenosis, bilateral; Nephrolithiasis; thymus ca (dx'd 2003); NHL (non-Hodgkin's lymphoma) (dx'd 2008); CANCER, THYMUS (11/12/2007); MANTLE CELL LYMPHOMA INTRA-ABDOMINAL LYMPH NODES (11/12/2007); Tricuspid valve regurgitation; Prerenal renal failure (09/20/2014); Acute on chronic diastolic CHF (congestive heart failure) (08/28/2014); E. coli UTI (08/28/2014); Fracture of femoral neck, left (12/09/2013); Acute blood loss anemia (12/10/2013); Hemorrhagic shock (08/05/2014); Syncope (08/05/2014); and Cough (11/17/2014).   Patient apparently came in from his PCP office due to worsening shortness of breath and evidence of bilateral lower extremity edema. Wife reports sore throat, and a  Cough productive of dark sputum sometimes green. Denied any fevers or chills. Apparently lower extremity edema is chronic but has been worsening over past few weeks. Shortness of breath worse if lays flat. He is not on home oxygen. On arrival to ER oxygen sat was low 90's. Currently on 1 L of O2.  He has been having worsening shortness of breath and coughing for the past 1 week. Denies any chest pain he has chronic headache for which she takes pain medicines.  He has had decreased appetite.  Patient is chronically ill he has had decreased mobility and poor function.  In emergency department had plain chest x-ray done  showing vascular congestion and small pleural effusions worrisome for CHF. Patient's dry weight is 190 pounds he is up to 192lb Patient initially was diagnosed with frontal cell lymphoma since 2009 has been followed by oncology and undergoing chemotherapy is currently on iburtinib.  Last echogram was in February 2015 showing EF of 55% with grade 1 diastolic dysfunction  Patient have had a complex medical history recently. In December he was admitted for hemorrhagic shock secondary to gastric ulcer hemorrhage requiring embolization. Also was biopsied and diagnosed with mantle cell lymphoma recurrence. In January he was admitted for heart failure and was found to have non-ST elevation MI which was treated conservatively due to recent severe hemorrhage. In February he was admitted for urinary tract infection treated with Rocephin urine culture grew Escherichia coli which was Cipro sensitive he was discharged home on ciprofloxacin.     Hospitalist was called for admission for [place] with CHF exacerbation Review  of Systems:    Pertinent positives include:  shortness of breath at rest.Bilateral lower extremity swelling excess mucus,   productive cough,  GI:   Constitutional:  No weight loss, night sweats, Fevers, chills, fatigue, weight loss  HEENT:  No headaches, Difficulty swallowing,Tooth/dental problems,Sore throat,  No sneezing, itching, ear ache, nasal congestion, post nasal drip,  Cardio-vascular:  No chest pain, Orthopnea, PND, anasarca, dizziness, palpitations.no  No heartburn, indigestion, abdominal pain, nausea, vomiting, diarrhea, change in bowel habits, loss of appetite, melena, blood in stool, hematemesis Resp:  no  No dyspnea on exertion, No No non-productive cough, No coughing up of blood.No change in color of mucus.No wheezing. Skin:  no rash or lesions. No jaundice GU:  no dysuria, change in color of urine, no urgency or frequency. No straining to urinate.  No flank pain.    Musculoskeletal:  No joint pain or no joint swelling. No decreased range of motion. No back pain.  Psych:  No change in mood or affect. No depression or anxiety. No memory loss.  Neuro: no localizing neurological complaints, no tingling, no weakness, no double vision, no gait abnormality, no slurred speech, no confusion  Otherwise ROS are negative except for above, 10 systems were reviewed  Past Medical History: Past Medical History  Diagnosis Date  . Hypertension   . CAD 11/12/2007    a. s/p CABG;  b. Cath 7/11 Patent LIMA to the LAD. Previously known occlusion of a saphenous vein graft to diagonal and sequential to obtuse marginals. Severe diffuse native obtuse marginal and diagonal branch vessel disease. Preserved ejection fraction.;  c. 03/2013 Lexi CL: mild inflat and inf ischemia, EF 54%->initial med rx.  Marland Kitchen HYPERLIPIDEMIA 11/12/2007  . HYPERTENSION 11/12/2007  . PERIPHERAL VASCULAR DISEASE 11/12/2007  . ARTHRITIS 11/12/2007  . BENIGN PROSTATIC HYPERTROPHY, HX OF 11/12/2007  . Edema 10/17/2010  . INGUINAL HERNIAS, BILATERAL 11/10/2006  . Obesity, unspecified 07/05/2009  . SLEEP APNEA 11/12/2007  . History of PTCA 2005  . Osteoarthritis   . Carotid stenosis, bilateral   . Nephrolithiasis   . thymus ca dx'd 2003    Radiation comp 2003  . NHL (non-Hodgkin's lymphoma) dx'd 2008    Chemo comp 10/2010; rituxin comp 10/2010  . 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 MANTLE CELL LYMPHOMA INTRA-ABDOMINAL LYMPH NODES 11/12/2007  . Tricuspid valve regurgitation   . Prerenal renal failure 09/20/2014  . Acute on chronic diastolic CHF (congestive heart failure) 08/28/2014  . E. coli UTI 08/28/2014  . Fracture of femoral neck, left 12/09/2013  . Acute blood loss anemia 12/10/2013  . Hemorrhagic shock 08/05/2014  . Syncope 08/05/2014  . Cough 11/17/2014   Past Surgical History  Procedure Laterality Date  . Coronary artery bypass graft    . Cardiac catheterization  08/2009, 02/2010  . Total hip arthroplasty Left  12/09/2013    Procedure: TOTAL HIP ARTHROPLASTY;  Surgeon: Mauri Pole, MD;  Location: [facility];  Service: Orthopedics;  Laterality: Left;  . Esophagogastroduodenoscopy N/A 08/02/2014    Procedure: ESOPHAGOGASTRODUODENOSCOPY (EGD) at bedside;  Surgeon: Irene Shipper, MD;  Location: [facility] END[facility];  Service: Endoscopy;  Laterality: N/A;  . Esophagogastroduodenoscopy (egd) with propofol N/A 08/10/2014    Procedure: ESOPHAGOGASTRODUODENOSCOPY (EGD) WITH PROPOFOL;  Surgeon: Milus Banister, MD;  Location: [facility];  Service: Endoscopy;  Laterality: N/A;     Medications: Prior to Admission medications   Medication Sig Start Date End Date Taking? Authorizing Provider  amLODipine (NORVASC) 5 MG tablet Take 1 tablet (5 mg total) by mouth daily. 01/12/14  Yes Minus Breeding, MD  atenolol (TENORMIN) 25 MG tablet Take 1 tablet (25 mg total) by mouth daily. 11/14/14  Yes Minus Breeding, MD  cephALEXin (KEFLEX) 500 MG capsule Take 500 mg by mouth daily.   Yes Historical Provider, MD  citalopram (CELEXA) 20 MG tablet TAKE ONE TABLET BY MOUTH ONCE DAILY 08/12/14  Yes Christina P Rama, MD  furosemide (LASIX) 40 MG tablet Take 20 mg by mouth daily.    Yes Historical Provider, MD  HYDROcodone-acetaminophen (NORCO/VICODIN) 5-325 MG per tablet Take 1 tablet by mouth every 6 (six) hours as needed for moderate pain. 12/12/14  Yes Heath Lark, MD  ibrutinib (IMBRUVICA) 140 MG capsul Take 4 capsules (560 mg total) by mouth daily. 08/23/14  Yes Heath Lark, MD  isosorbide mononitrate (IMDUR) 30 MG 24 hr tablet Take 3 tablets (90  mg total) by mouth daily. 10/07/14  Yes Minus Breeding, MD  lactose free nutrition (BOOST PLUS) LIQD Take 237 mLs by mouth 2 (two) times daily as needed (Allow w/pt or pt's wife request after diet advancement). Patient taking differently: Take 237 mLs by mouth 2 (two) times daily as needed (nutritional supplement).  12/13/13  Yes Annita Brod, MD  nitroGLYCERIN (NITROSTAT) 0.4 MG SL tablet Place  1 tablet (0.4 mg total) under the tongue every 5 (five) minutes as needed for chest pain. 01/12/14  Yes Minus Breeding, MD  pantoprazole (PROTONIX) 40 MG tablet Take 1 tablet (40 mg total) by mouth 2 (two) times daily. 11/23/14  Yes Heath Lark, MD  pravastatin (PRAVACHOL) 80 MG tablet Take 1 tablet (80 mg total) by mouth daily. 01/12/14  Yes Minus Breeding, MD  guaiFENesin-codeine 100-10 MG/5ML syrup Take 5 mLs by mouth 3 (three) times daily as needed for cough. 11/17/14   Heath Lark, MD  methocarbamol (ROBAXIN) 500 MG tablet Take 1 tablet (500 mg total) by mouth every 6 (six) hours as needed for muscle spasms. 12/10/13   Danae Orleans, PA-C    Allergies:   Allergies  Allergen Reactions  . Atorvastatin Other (See Comments)    Incredible dizziness    Social History:  Ambulatory  Walker or  Kasandra Knudsen Lives at home   With family Wife Jeannene Patella [phone]   reports that he has never smoked. He has never used smokeless tobacco. He reports that he does not drink alcohol or use illicit drugs.    Family History: family history includes Coronary artery disease in his father; Heart attack in his brother and mother.    Physical Exam: Patient Vitals for the past 24 hrs:  BP Temp Temp src Pulse Resp SpO2  12/14/14 1953 135/69 mmHg 97.5 F (36.4 C) Oral 62 18 93 %  12/14/14 1721 126/63 mmHg - - 62 16 97 %  12/14/14 1628 111/73 mmHg - - 65 17 94 %  12/14/14 1529 100/72 mmHg 98.3 F (36.8 C) Oral 65 - 93 %    1. General:  in No Acute distress 2. Psychological: Alert and  Oriented 3. Head/ENT:    Dry Mucous Membranes                          Head Non traumatic, neck supple                           Poor Dentition 4. SKIN: slightly decreased Skin turgor,  Skin clean Dry and intact no rash 5. Heart: Regular rate and rhythm no Murmur, Rub or gallop 6. Lungs:   no wheezes extensive crackles worse at the bases  7. Abdomen: Soft, non-tender, slightlydistended 8. Lower extremities: no clubbing, cyanosis,  3+ edema the right and 2+ on the left edema 9. Neurologically Grossly intact, moving all 4 extremities equally 10. MSK: Normal range of motion  body mass index is unknown because there is no weight on file.   Labs on Admission:   Results for orders placed or performed during the hospital encounter of 12/14/14 (from the past 24 hour(s))  Basic metabolic panel     Status: Abnormal   Collection Time: 12/14/14  4:54 PM  Result Value Ref Range   Sodium 138 135 - 145 mmol/L   Potassium 3.4 (L) 3.5 - 5.1 mmol/L   Chloride 102 96 - 112 mmol/L   CO2 28 19 -  32 mmol/L   Glucose, Bld 96 70 - 99 mg/dL   BUN 24 (H) 6 - 23 mg/dL   Creatinine, Ser 1.73 (H) 0.50 - 1.35 mg/dL   Calcium 8.3 (L) 8.4 - 10.5 mg/dL   GFR calc non Af Amer 36 (L) >90 mL/min   GFR calc Af Amer 41 (L) >90 mL/min   Anion gap 8 5 - 15  Troponin I     Status: None   Collection Time: 12/14/14  4:54 PM  Result Value Ref Range   Troponin I <0.03 <0.031 ng/mL  CBC with Differential     Status: Abnormal   Collection Time: 12/14/14  4:54 PM  Result Value Ref Range   WBC 5.3 4.0 - 10.5 K/uL   RBC 3.65 (L) 4.22 - 5.81 MIL/uL   Hemoglobin 10.1 (L) 13.0 - 17.0 g/dL   HCT 32.2 (L) 39.0 - 52.0 %   MCV 88.2 78.0 - 100.0 fL   MCH 27.7 26.0 - 34.0 pg   MCHC 31.4 30.0 - 36.0 g/dL   RDW 15.4 11.5 - 15.5 %   Platelets 171 150 - 400 K/uL   Neutrophils Relative % 25 (L) 43 - 77 %   Lymphocytes Relative 40 12 - 46 %   Monocytes Relative 33 (H) 3 - 12 %   Eosinophils Relative 1 0 - 5 %   Basophils Relative 1 0 - 1 %   Neutro Abs 1.3 (L) 1.7 - 7.7 K/uL   Lymphs Abs 2.0 0.7 - 4.0 K/uL   Monocytes Absolute 1.8 (H) 0.1 - 1.0 K/uL   Eosinophils Absolute 0.1 0.0 - 0.7 K/uL   Basophils Absolute 0.1 0.0 - 0.1 K/uL   WBC Morphology WHITE COUNT CONFIRMED ON SMEAR    Smear Review PLATELET COUNT CONFIRMED BY SMEAR   Brain natriuretic peptide     Status: Abnormal   Collection Time: 12/14/14  4:54 PM  Result Value Ref Range   B Natriuretic  Peptide 125.3 (H) 0.0 - 100.0 pg/mL  I-Stat CG4 Lactic Acid, ED     Status: None   Collection Time: 12/14/14  5:05 PM  Result Value Ref Range   Lactic Acid, Venous 0.59 0.5 - 2.0 mmol/L    UA UA was not obtained  Lab Results  Component Value Date   HGBA1C 5.9* 08/10/2014    CrCl cannot be calculated (Unknown ideal weight.).  BNP (last 3 results) No results for input(s): PROBNP in the last 8760 hours.  Other results:  I have pearsonaly reviewed this: ECG REPORT  Rate: 58  Rhythm: Sinus rhythm  ST&T Change:  no ischemic changes improved from prior  QTC 461   There were no vitals filed for this visit.   Cultures:    Component Value Date/Time   SDES URINE, CATHETERIZED 10/08/2014 1527   SPECREQUEST Normal 10/08/2014 1527   CULT  10/08/2014 1527    ESCHERICHIA COLI Performed at [facility] 10/11/2014 FINAL 10/08/2014 1527     Radiological Exams on Admission: Dg Chest 2 View  12/14/2014   CLINICAL DATA:  Shortness of Breath  EXAM: CHEST  2 VIEW  COMPARISON:  10/08/2014  FINDINGS: Cardiac shadow is stable. A right-sided chest wall port is again seen. Lungs are well aerated bilaterally and demonstrate mild vascular congestion. No focal confluent infiltrate is seen. Small pleural effusions are noted bilaterally.  IMPRESSION: Vascular congestion small pleural effusions consistent with CHF.   Electronically Signed   By: Inez Catalina  M.D.   On: 12/14/2014 18:15    Chart has been reviewed  Assessment/Plan  81 year old gentleman with history of mantle cell lymphoma currently undergoing chemotherapy here with what appears to be acute on chronic heart failure exacerbation  Present on Admission:   acute on chronic diastolic heart failure exacerbation -- admit on telemetry, cycle cardiac enzymes, obtain serial ECG, to evaluate for ischemia as a cause of heart failure  monitor daily weight  diurese with IV lasix and monitor orthostatics and creatinine to  avoid over diuresis.  Order echogram to evaluate EF and valves  patient is not on  ACE/ARBi due to renal insufficiency Given some symptoms not typical for CHF will repeat chest x-ray tomorrow to see if there is any underlying infiltrate  . Chronic kidney disease (CKD), stage II (mild) continue to monitor creatinine while being diuresed  . Essential hypertension continue home medications but according Norvasc as this can worsen leg swelling  . Hypokalemia will replace been mindful of renal insufficiency . 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 We'll need to notify oncology in the morning the patient is here given asymmetrical edema will obtain Doppler of lower extremity given history of malignancy Prophylaxis:   Lovenox, Protonix  CODE STATUS:    DNR/DNI ar per patient  Other plan as per orders.  I have spent a total of 55 min on this admission  Calven Gilkes 12/14/2014, 8:47 PM  [facility]  Pager [phone]   after 2 AM please page floor coverage PA If 7AM-7PM, please contact the day team taking care of the patient  [email or web address]  Password [email or web address]

## 2014-12-14 NOTE — ED Notes (Signed)
Per EMS pt comes from Dr Murrell Redden office for [facility]. Pt also has swelling in lower extremities that has been getting worse over the several weeks.

## 2014-12-14 NOTE — ED Provider Notes (Signed)
CSN: [id number]     Arrival date & time 12/14/14  1528 History   First MD Initiated Contact with Patient 12/14/14 1603     Chief Complaint  Patient presents with  . Shortness of Breath     (Consider location/radiation/quality/duration/timing/severity/associated sxs/prior Treatment) HPI  81 year old male presents with shortness of breath and cough for the past 1 week. Went to his PCPs office and was sent here to rule out pneumonia. He's also had progressive leg swelling despite taking his Lasix. Patient is not had any chest pain. He's had a headache with cough as well as a progressive sore throat. No fevers. Cough is green and white sputum. Does not regularly wear oxygen, oxygen saturation 91% on arrival. Right leg is chronically more swollen than the left due to prior CABG, but now both legs are more swollen than normal.  Past Medical History  Diagnosis Date  . Hypertension   . CAD 11/12/2007    a. s/p CABG;  b. Cath 7/11 Patent LIMA to the LAD. Previously known occlusion of a saphenous vein graft to diagonal and sequential to obtuse marginals. Severe diffuse native obtuse marginal and diagonal branch vessel disease. Preserved ejection fraction.;  c. 03/2013 Lexi CL: mild inflat and inf ischemia, EF 54%->initial med rx.  Marland Kitchen HYPERLIPIDEMIA 11/12/2007  . HYPERTENSION 11/12/2007  . PERIPHERAL VASCULAR DISEASE 11/12/2007  . ARTHRITIS 11/12/2007  . BENIGN PROSTATIC HYPERTROPHY, HX OF 11/12/2007  . Edema 10/17/2010  . INGUINAL HERNIAS, BILATERAL 11/10/2006  . Obesity, unspecified 07/05/2009  . SLEEP APNEA 11/12/2007  . History of PTCA 2005  . Osteoarthritis   . Carotid stenosis, bilateral   . Nephrolithiasis   . thymus ca dx'd 2003    Radiation comp 2003  . NHL (non-Hodgkin's lymphoma) dx'd 2008    Chemo comp 10/2010; rituxin comp 10/2010  . 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 MANTLE CELL LYMPHOMA INTRA-ABDOMINAL LYMPH NODES 11/12/2007  . Tricuspid valve regurgitation   . Prerenal renal failure 09/20/2014   . Acute on chronic diastolic CHF (congestive heart failure) 08/28/2014  . E. coli UTI 08/28/2014  . Fracture of femoral neck, left 12/09/2013  . Acute blood loss anemia 12/10/2013  . Hemorrhagic shock 08/05/2014  . Syncope 08/05/2014  . Cough 11/17/2014   Past Surgical History  Procedure Laterality Date  . Coronary artery bypass graft    . Cardiac catheterization  08/2009, 02/2010  . Total hip arthroplasty Left 12/09/2013    Procedure: TOTAL HIP ARTHROPLASTY;  Surgeon: Mauri Pole, MD;  Location: [facility];  Service: Orthopedics;  Laterality: Left;  . Esophagogastroduodenoscopy N/A 08/02/2014    Procedure: ESOPHAGOGASTRODUODENOSCOPY (EGD) at bedside;  Surgeon: Irene Shipper, MD;  Location: [facility];  Service: Endoscopy;  Laterality: N/A;  . Esophagogastroduodenoscopy (egd) with propofol N/A 08/10/2014    Procedure: ESOPHAGOGASTRODUODENOSCOPY (EGD) WITH PROPOFOL;  Surgeon: Milus Banister, MD;  Location: [facility];  Service: Endoscopy;  Laterality: N/A;   Family History  Problem Relation Age of Onset  . Coronary artery disease Father     died at 43  . Heart attack Mother     died at age 6  . Heart attack Brother    History  Substance Use Topics  . Smoking status: Never Smoker   . Smokeless tobacco: Never Used  . Alcohol Use: No    Review of Systems  Constitutional: Negative for fever.  HENT: Positive for sore throat.   Respiratory: Positive for cough and shortness of breath.   Cardiovascular: Positive for leg swelling.  Negative for chest pain.  Neurological: Positive for headaches.  All other systems reviewed and are negative.     Allergies  Atorvastatin  Home Medications   Prior to Admission medications   Medication Sig Start Date End Date Taking? Authorizing Provider  amLODipine (NORVASC) 5 MG tablet Take 1 tablet (5 mg total) by mouth daily. 01/12/14  Yes Minus Breeding, MD  atenolol (TENORMIN) 25 MG tablet Take 1 tablet (25 mg total) by mouth daily. 11/14/14  Yes  Minus Breeding, MD  cephALEXin (KEFLEX) 500 MG capsule Take 500 mg by mouth daily.   Yes Historical Provider, MD  citalopram (CELEXA) 20 MG tablet TAKE ONE TABLET BY MOUTH ONCE DAILY 08/12/14  Yes Christina P Rama, MD  furosemide (LASIX) 40 MG tablet Take 20 mg by mouth daily.    Yes Historical Provider, MD  HYDROcodone-acetaminophen (NORCO/VICODIN) 5-325 MG per tablet Take 1 tablet by mouth every 6 (six) hours as needed for moderate pain. 12/12/14  Yes Heath Lark, MD  ibrutinib (IMBRUVICA) 140 MG capsul Take 4 capsules (560 mg total) by mouth daily. 08/23/14  Yes Heath Lark, MD  isosorbide mononitrate (IMDUR) 30 MG 24 hr tablet Take 3 tablets (90 mg total) by mouth daily. 10/07/14  Yes Minus Breeding, MD  lactose free nutrition (BOOST PLUS) LIQD Take 237 mLs by mouth 2 (two) times daily as needed (Allow w/pt or pt's wife request after diet advancement). Patient taking differently: Take 237 mLs by mouth 2 (two) times daily as needed (nutritional supplement).  12/13/13  Yes Annita Brod, MD  nitroGLYCERIN (NITROSTAT) 0.4 MG SL tablet Place 1 tablet (0.4 mg total) under the tongue every 5 (five) minutes as needed for chest pain. 01/12/14  Yes Minus Breeding, MD  pantoprazole (PROTONIX) 40 MG tablet Take 1 tablet (40 mg total) by mouth 2 (two) times daily. 11/23/14  Yes Heath Lark, MD  pravastatin (PRAVACHOL) 80 MG tablet Take 1 tablet (80 mg total) by mouth daily. 01/12/14  Yes Minus Breeding, MD  guaiFENesin-codeine 100-10 MG/5ML syrup Take 5 mLs by mouth 3 (three) times daily as needed for cough. 11/17/14   Heath Lark, MD  methocarbamol (ROBAXIN) 500 MG tablet Take 1 tablet (500 mg total) by mouth every 6 (six) hours as needed for muscle spasms. 12/10/13   Danae Orleans, PA-C   BP 100/72 mmHg  Pulse 65  Temp(Src) 98.3 F (36.8 C) (Oral)  SpO2 93% Physical Exam  Constitutional: He is oriented to person, place, and time. He appears well-developed and well-nourished.  HENT:  Head: Normocephalic and  atraumatic.  Right Ear: External ear normal.  Left Ear: External ear normal.  Nose: Nose normal.  Eyes: Right eye exhibits no discharge. Left eye exhibits no discharge.  Neck: Neck supple.  Cardiovascular: Normal rate, regular rhythm, normal heart sounds and intact distal pulses.   Pulmonary/Chest: Effort normal.  Intermittent expiratory wheezes and rhonchi  Abdominal: Soft. There is no tenderness.  Musculoskeletal: He exhibits no edema (bilateral pitting edema in lower extremities, R>L).  Neurological: He is alert and oriented to person, place, and time.  Skin: Skin is warm and dry.  Nursing note and vitals reviewed.   ED Course  Procedures (including critical care time) Labs Review Labs Reviewed  BASIC METABOLIC PANEL - Abnormal; Notable for the following:    Potassium 3.4 (*)    BUN 24 (*)    Creatinine, Ser 1.73 (*)    Calcium 8.3 (*)    GFR calc non Af Amer 36 (*)  GFR calc Af Amer 41 (*)    All other components within normal limits  CBC WITH DIFFERENTIAL/PLATELET - Abnormal; Notable for the following:    RBC 3.65 (*)    Hemoglobin 10.1 (*)    HCT 32.2 (*)    Neutrophils Relative % 25 (*)    Monocytes Relative 33 (*)    Neutro Abs 1.3 (*)    Monocytes Absolute 1.8 (*)    All other components within normal limits  BRAIN NATRIURETIC PEPTIDE - Abnormal; Notable for the following:    B Natriuretic Peptide 125.3 (*)    All other components within normal limits  CBC - Abnormal; Notable for the following:    RBC 3.73 (*)    Hemoglobin 10.3 (*)    HCT 32.7 (*)    All other components within normal limits  CREATININE, SERUM - Abnormal; Notable for the following:    Creatinine, Ser 1.70 (*)    GFR calc non Af Amer 36 (*)    GFR calc Af Amer 42 (*)    All other components within normal limits  HEPATIC FUNCTION PANEL - Abnormal; Notable for the following:    Total Protein 5.8 (*)    Albumin 3.0 (*)    Total Bilirubin 1.3 (*)    Indirect Bilirubin 1.0 (*)    All other  components within normal limits  TROPONIN I  PROCALCITONIN  TROPONIN I  COMPREHENSIVE METABOLIC PANEL  MAGNESIUM  BRAIN NATRIURETIC PEPTIDE  TSH  TROPONIN I  TROPONIN I  I-STAT CG4 LACTIC ACID, ED    Imaging Review Dg Chest 2 View  12/14/2014   CLINICAL DATA:  Shortness of Breath  EXAM: CHEST  2 VIEW  COMPARISON:  10/08/2014  FINDINGS: Cardiac shadow is stable. A right-sided chest wall port is again seen. Lungs are well aerated bilaterally and demonstrate mild vascular congestion. No focal confluent infiltrate is seen. Small pleural effusions are noted bilaterally.  IMPRESSION: Vascular congestion small pleural effusions consistent with CHF.   Electronically Signed   By: Inez Catalina M.D.   On: 12/14/2014 18:15     EKG Interpretation   Date/Time:  Wednesday December 14 2014 16:28:52 EDT Ventricular Rate:  58 PR Interval:  159 QRS Duration: 101 QT Interval:  470 QTC Calculation: 462 R Axis:   9 Text Interpretation:  Sinus rhythm Low voltage, precordial leads  Borderline repolarization abnormality ST/T wave changes from Jan 2016  improved Confirmed by Regenia Skeeter  MD, Damon [id number]) on 12/14/2014 6:28:54 PM      MDM   Final diagnoses:  Shortness of breath    Patient with increased dyspnea and cough.  No PNA on CXR but does have pulmonary edema, which combined with his peripheral leg swelling is clinically consistent with CHF. Will give IV lasix in ED. Oddly his BNP is not significantly elevated, but this appears to be the most likely cause. Requiring supplemental oxygen, thus will admit to medicine.     Sherwood Gambler, MD 12/15/14 [phone]

## 2014-12-14 NOTE — Clinical Social Work Note (Signed)
Clinical Social Work Assessment  Patient Details  Name: Martin Morris MRN: [id number] Date of Birth: 1933/03/15  Date of referral:  12/14/14               Reason for consult:  Other (Comment Required)                Permission sought to share information with:    Permission granted to share information::  No  Name::        Agency::     Relationship::     Contact Information:     Housing/Transportation Living arrangements for the past 2 months:  Single Family Home Source of Information:  Patient, Spouse Patient Interpreter Needed:  None Criminal Activity/Legal Involvement Pertinent to Current Situation/Hospitalization:  No - Comment as needed Significant Relationships:  Spouse Lives with:  Spouse Do you feel safe going back to the place where you live?  Yes Need for family participation in patient care:  Yes (Comment)  Care giving concerns:   The patient appears to have a great support system. He currently lives in [place] with his wife. Also, his son and daughter in law live in [place].   Social Worker assessment / plan:   CSW meat with pt at bedside. Family was present. Patient informed CSW that he comes from home and lives with his wife. Once CSW asked patient why he came to [facility] he stated " Donnald Garre been having a lot of trouble coughing and heart trouble." Family states that he was BIB ambulance.  Patient informed CSW that he completes his ADL's independently. However, he states that sometimes his wife assist him with putting on his clothes. Patient informed CSW that he has not fallen within the past 6 months.  Patient informed CSW that he is not interested in a facility. He also says that he would not be interested in home health.  Wife informed CSW that she is the patient's primary support and she and him live in [place].  Daughter in Sports coach and son also state that they live in [place].  Wife/Pam Iiams [phone] Daughter in law/ Gary-Anne [phone]   Employment  status:    Insurance information:  Medicare PT Recommendations:  No Follow Up Information / Referral to community resources:  Other (Comment Required) (Pt states he is not interested in facility at this time.)  Patient/Family's Response to care:  Family appears to be supportive of patient. Family stated that they did not have any questions and did not state any concerns. Wife states that she is his primary support.   Patient/Family's Understanding of and Emotional Response to Diagnosis, Current Treatment, and Prognosis:  Patient appears to be determined that he and wife will be able to manage his care at home. Patient states he is not interested in a facility.  Emotional Assessment Appearance:  Appears stated age Attitude/Demeanor/Rapport:   ( well mannered) Affect (typically observed):  Accepting, Appropriate Orientation:  Oriented to Self, Oriented to Place, Oriented to  Time, Oriented to Situation Alcohol / Substance use:  Not Applicable Psych involvement (Current and /or in the community):  No (Comment)  Discharge Needs  Concerns to be addressed:  No discharge needs identified Readmission within the last 30 days:    Current discharge risk:  None Barriers to Discharge:  No Barriers Identified   Bernita Buffy, LCSW 12/14/2014, 11:05 PM

## 2014-12-14 NOTE — ED Notes (Signed)
Patient has a PORT, unable to get blood at this time.

## 2014-12-14 NOTE — ED Notes (Signed)
Patient transported to X-ray 

## 2014-12-14 NOTE — ED Notes (Signed)
Patient states he has a really bad head ache the worst he has ever had

## 2014-12-14 NOTE — Progress Notes (Signed)
CSW meat with pt at bedside. Family was present. Patient informed CSW that he comes from home and lives with his wife. Once CSW asked patient why he came to [facility] he stated " Donnald Garre been having a lot of trouble coughing and heart trouble." Family states that he was BIB ambulance.  Patient informed CSW that he completes his ADL's independently. However, he states that sometimes his wife assist him with putting on his clothes. Patient informed CSW that he has not fallen within the past 6 months.  Patient informed CSW that he is not interested in a facility. He also says that he would not be interested in home health.  Wife informed CSW that she is the patient's primary support and she and him live in [place].  Daughter in Sports coach and son also state that they live in [place].  Wife/Pam Amison [phone] Daughter in law/ Gary-Anne [phone]  Willette Brace [phone] ED CSW 12/14/2014 8:53 PM

## 2014-12-15 ENCOUNTER — Inpatient Hospital Stay (HOSPITAL_COMMUNITY): Payer: Medicare Other

## 2014-12-15 DIAGNOSIS — J9601 Acute respiratory failure with hypoxia: Secondary | ICD-10-CM | POA: Diagnosis not present

## 2014-12-15 DIAGNOSIS — I10 Essential (primary) hypertension: Secondary | ICD-10-CM | POA: Diagnosis not present

## 2014-12-15 DIAGNOSIS — N182 Chronic kidney disease, stage 2 (mild): Secondary | ICD-10-CM | POA: Diagnosis not present

## 2014-12-15 DIAGNOSIS — E876 Hypokalemia: Secondary | ICD-10-CM | POA: Diagnosis not present

## 2014-12-15 DIAGNOSIS — J209 Acute bronchitis, unspecified: Secondary | ICD-10-CM | POA: Diagnosis not present

## 2014-12-15 DIAGNOSIS — I5033 Acute on chronic diastolic (congestive) heart failure: Secondary | ICD-10-CM | POA: Diagnosis not present

## 2014-12-15 LAB — COMPREHENSIVE METABOLIC PANEL
ALK PHOS: 90 U/L (ref 39–117)
ALT: 7 U/L (ref 0–53)
ANION GAP: 13 (ref 5–15)
AST: 17 U/L (ref 0–37)
Albumin: 3.2 g/dL — ABNORMAL LOW (ref 3.5–5.2)
BILIRUBIN TOTAL: 1.3 mg/dL — AB (ref 0.3–1.2)
BUN: 22 mg/dL (ref 6–23)
CHLORIDE: 100 mmol/L (ref 96–112)
CO2: 28 mmol/L (ref 19–32)
CREATININE: 1.56 mg/dL — AB (ref 0.50–1.35)
Calcium: 8.8 mg/dL (ref 8.4–10.5)
GFR calc Af Amer: 47 mL/min — ABNORMAL LOW (ref >90)
GFR calc non Af Amer: 40 mL/min — ABNORMAL LOW (ref >90)
Glucose, Bld: 79 mg/dL (ref 70–99)
Potassium: 2.9 mmol/L — ABNORMAL LOW (ref 3.5–5.1)
Sodium: 141 mmol/L (ref 135–145)
Total Protein: 5.9 g/dL — ABNORMAL LOW (ref 6.0–8.3)

## 2014-12-15 LAB — HEPATIC FUNCTION PANEL
ALK PHOS: 87 U/L (ref 39–117)
ALT: 8 U/L (ref 0–53)
AST: 17 U/L (ref 0–37)
Albumin: 3 g/dL — ABNORMAL LOW (ref 3.5–5.2)
BILIRUBIN TOTAL: 1.3 mg/dL — AB (ref 0.3–1.2)
Bilirubin, Direct: 0.3 mg/dL (ref 0.0–0.5)
Indirect Bilirubin: 1 mg/dL — ABNORMAL HIGH (ref 0.3–0.9)
TOTAL PROTEIN: 5.8 g/dL — AB (ref 6.0–8.3)

## 2014-12-15 LAB — CREATININE, SERUM
CREATININE: 1.7 mg/dL — AB (ref 0.50–1.35)
GFR calc Af Amer: 42 mL/min — ABNORMAL LOW (ref >90)
GFR calc non Af Amer: 36 mL/min — ABNORMAL LOW (ref >90)

## 2014-12-15 LAB — CBC
HCT: 32.7 % — ABNORMAL LOW (ref 39.0–52.0)
HEMOGLOBIN: 10.3 g/dL — AB (ref 13.0–17.0)
MCH: 27.6 pg (ref 26.0–34.0)
MCHC: 31.5 g/dL (ref 30.0–36.0)
MCV: 87.7 fL (ref 78.0–100.0)
Platelets: 185 10*3/uL (ref 150–400)
RBC: 3.73 MIL/uL — ABNORMAL LOW (ref 4.22–5.81)
RDW: 15.4 % (ref 11.5–15.5)
WBC: 5 10*3/uL (ref 4.0–10.5)

## 2014-12-15 LAB — TROPONIN I: Troponin I: <0.03 ng/mL (ref <0.031)

## 2014-12-15 LAB — MAGNESIUM: Magnesium: 1.9 mg/dL (ref 1.5–2.5)

## 2014-12-15 LAB — BRAIN NATRIURETIC PEPTIDE: B Natriuretic Peptide: 113.7 pg/mL — ABNORMAL HIGH (ref 0.0–100.0)

## 2014-12-15 LAB — TSH: TSH: 3.339 u[IU]/mL (ref 0.350–4.500)

## 2014-12-15 MED ORDER — AZITHROMYCIN 250 MG PO TABS
250.0000 mg | ORAL_TABLET | Freq: Every day | ORAL | Status: DC
  Administered 2014-12-16 – 2014-12-17 (×2): 250 mg via ORAL
  Filled 2014-12-15 (×2): qty 1

## 2014-12-15 MED ORDER — AZITHROMYCIN 500 MG PO TABS
500.0000 mg | ORAL_TABLET | Freq: Every day | ORAL | Status: AC
  Administered 2014-12-15: 500 mg via ORAL
  Filled 2014-12-15: qty 1

## 2014-12-15 MED ORDER — POTASSIUM CHLORIDE CRYS ER 20 MEQ PO TBCR
20.0000 meq | EXTENDED_RELEASE_TABLET | Freq: Once | ORAL | Status: AC
  Administered 2014-12-15: 20 meq via ORAL
  Filled 2014-12-15: qty 1

## 2014-12-15 MED ORDER — SODIUM CHLORIDE 0.9 % IJ SOLN
10.0000 mL | INTRAMUSCULAR | Status: DC | PRN
  Administered 2014-12-15: 10 mL
  Administered 2014-12-15: 20 mL
  Administered 2014-12-16 – 2014-12-17 (×3): 10 mL
  Filled 2014-12-15 (×5): qty 40

## 2014-12-15 MED ORDER — POTASSIUM CHLORIDE CRYS ER 20 MEQ PO TBCR
40.0000 meq | EXTENDED_RELEASE_TABLET | ORAL | Status: AC
  Administered 2014-12-15 (×2): 40 meq via ORAL
  Filled 2014-12-15 (×2): qty 2

## 2014-12-15 MED ORDER — ENOXAPARIN SODIUM 40 MG/0.4ML ~~LOC~~ SOLN
40.0000 mg | Freq: Every day | SUBCUTANEOUS | Status: DC
  Administered 2014-12-15 – 2014-12-16 (×2): 40 mg via SUBCUTANEOUS
  Filled 2014-12-15 (×2): qty 0.4

## 2014-12-15 MED ORDER — HYDROCOD POLST-CPM POLST ER 10-8 MG/5ML PO SUER
5.0000 mL | Freq: Two times a day (BID) | ORAL | Status: DC
  Administered 2014-12-15 – 2014-12-17 (×4): 5 mL via ORAL
  Filled 2014-12-15 (×4): qty 5

## 2014-12-15 MED ORDER — DM-GUAIFENESIN ER 30-600 MG PO TB12
1.0000 | ORAL_TABLET | Freq: Two times a day (BID) | ORAL | Status: DC
  Administered 2014-12-15 – 2014-12-17 (×5): 1 via ORAL
  Filled 2014-12-15 (×5): qty 1

## 2014-12-15 NOTE — Progress Notes (Addendum)
[facility] Progress Note   Martin Morris:[id number] DOB: 1933-04-16 DOA: 12/14/2014 PCP: [name], MD  Brief narrative: Martin Morris is a 81 y.o. male with a past medical history of mantle cell lymphoma, essential hypertension, CAD status post CABG. He was sent from Dr. Murrell Redden office for shortness of breath. He also had complaints of cough with greenish colored sputum. Noted to have an oxygen saturation of 91% on room air. Also complained of increased swelling in his legs and shortness of breath on laying flat.   Subjective: Cough continues.   Assessment/Plan: Principal Problem:   Acute on chronic diastolic heart failure -Was admitted for this in the past-[facility]Dr. Percival Spanish - Pulmonary edema noted on chest x-ray on admission with an elevated BNP with improvement in infiltrates noted on x-ray this morning -he was given furosemide 40 mg followed by 40 IV twice a day and is in negative balance by 4334 mL this morning - will hold Lasix today - Last echo performed in 08/14/2014 revealed an EF of 55% and grade 1 diastolic dysfunction.-  repeat echo shows the same  Active Problems: Cough with congestion- hypoxic respiratory failure - continues to have a pulse ox of 89-90% on room air - Likely acute bronchitis-continue Zithromax -follow- repeat chest x-ray after diuresis does not reveal a pneumonia    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 Alvy Bimler- appears to be responding to treatment with Ibrutinib per last note  Mildly elevated bilirubin -Not sure if this is related to his cancer-asymptomatic-follow    Essential hypertension -BP reasonably controlled continue Imdur and atenolol  Coronary artery disease -Was previously on aspirin but this has been discontinued due to GI bleed in 12/15    Chronic kidney disease (CKD), stage III   -Creatinine slightly improved with diuretics    Hypokalemia -Due to diuretics-replaced today-magnesium normal  Malignant gastric ulcer with bleeding- EGD 12/15   Appt with PCP: Code Status: DO NOT RESUSCITATE Disposition Plan: Return to SNF when stable DVT prophylaxis: Lovenox  Consultants: Procedures:  Antibiotics: Anti-infectives    Start     Dose/Rate Route Frequency Ordered Stop   12/16/14 1000  azithromycin (ZITHROMAX) tablet 250 mg     250 mg Oral Daily 12/15/14 0842 12/20/14 0959   12/15/14 1000  cephALEXin (KEFLEX) capsule 500 mg  Status:  Discontinued     500 mg Oral Daily 12/14/14 2239 12/15/14 0842   12/15/14 1000  azithromycin (ZITHROMAX) tablet 500 mg     500 mg Oral Daily 12/15/14 0842 12/15/14 1134      Objective: Filed Weights   12/14/14 2155 12/15/14 1100  Weight: 83.5 kg (184 lb 1.4 oz) 82.7 kg (182 lb 5.1 oz)    Intake/Output Summary (Last 24 hours) at 12/15/14 1159 Last data filed at 12/15/14 1022  Gross per 24 hour  Intake     10 ml  Output   1700 ml  Net  -1690 ml     Vitals Filed Vitals:   12/14/14 2149 12/14/14 2155 12/15/14 0559 12/15/14 1100  BP: 125/64  129/68   Pulse: 60  62   Temp: 97.7 F (36.5 C)  97.8 F (36.6 C)   TempSrc: Oral  Oral   Resp: 20  20   Height:  5\' 9"  (1.753 m)    Weight:  83.5 kg (184 lb 1.4 oz)  82.7 kg (182 lb 5.1 oz)  SpO2: 98%  91%     Exam:  General:  Pt is alert, not in acute distress  HEENT: No icterus, No thrush  Cardiovascular: regular rate and rhythm, S1/S2 No murmur  Respiratory: coughing constantly- crackles at bases  Abdomen: Soft, +Bowel sounds, non tender, non distended, no guarding  MSK: No LE edema, cyanosis or clubbing  Data Reviewed: Basic Metabolic Panel:  Recent Labs Lab 12/14/14 1654 12/14/14 2238 12/15/14 0442  NA 138  --  141  K 3.4*  --  2.9*  CL 102  --  100  CO2 28  --  28  GLUCOSE 96  --  79  BUN 24*  --  22  CREATININE 1.73* 1.70* 1.56*  CALCIUM 8.3*  --   8.8  MG  --   --  1.9   Liver Function Tests:  Recent Labs Lab 12/14/14 2238 12/15/14 0442  AST 17 17  ALT 8 7  ALKPHOS 87 90  BILITOT 1.3* 1.3*  PROT 5.8* 5.9*  ALBUMIN 3.0* 3.2*   No results for input(s): LIPASE, AMYLASE in the last 168 hours. No results for input(s): AMMONIA in the last 168 hours. CBC:  Recent Labs Lab 12/14/14 1654 12/14/14 2238  WBC 5.3 5.0  NEUTROABS 1.3*  --   HGB 10.1* 10.3*  HCT 32.2* 32.7*  MCV 88.2 87.7  PLT 171 185   Cardiac Enzymes:  Recent Labs Lab 12/14/14 1654 12/14/14 2238 12/15/14 0442  TROPONINI <0.03 <0.03 <0.03   BNP (last 3 results)  Recent Labs  08/31/14 0440 12/14/14 1654 12/15/14 0442  BNP 394.4* 125.3* 113.7*    ProBNP (last 3 results) No results for input(s): PROBNP in the last 8760 hours.  CBG: No results for input(s): GLUCAP in the last 168 hours.  No results found for this or any previous visit (from the past 240 hour(s)).   Studies:  Recent x-ray studies have been reviewed in detail by the Attending Physician  Scheduled Meds:  Scheduled Meds: . atenolol  25 mg Oral Daily  . [START ON 12/16/2014] azithromycin  250 mg Oral Daily  . citalopram  20 mg Oral Daily  . dextromethorphan-guaiFENesin  1 tablet Oral BID  . enoxaparin (LOVENOX) injection  30 mg Subcutaneous QHS  . furosemide  40 mg Intravenous BID  . ibrutinib  560 mg Oral Daily  . isosorbide mononitrate  90 mg Oral Daily  . pantoprazole  40 mg Oral BID  . pravastatin  80 mg Oral Daily  . sodium chloride  3 mL Intravenous Q12H   Continuous Infusions:   Time spent on care of this patient: 35 min   [facility], MD 12/15/2014, 11:59 AM  LOS: 1 day   [facility] Office  [phone] Pager - Text Page per [email or web address]  If 7PM-7AM, please contact night-coverage [email or web address]

## 2014-12-15 NOTE — Progress Notes (Signed)
INITIAL NUTRITION ASSESSMENT  DOCUMENTATION CODES Per approved criteria  -Not Applicable   INTERVENTION: - Continue Boost BID - Continue Heart Healthy diet  NUTRITION DIAGNOSIS: Inadequate protein-energy intake related to poor appetite with decreased appetite x2 weeks as evidenced by pt's wife's report.   Goal: Pt to meet >/= 90% estimated nutrition needs  Monitor:  Meal and supplement intakes, weight trends, labs, I/O's  Reason for Assessment: Per rounds this AM  81 y.o. male  Admitting Dx: Acute on chronic diastolic heart failure  ASSESSMENT: 81 y.o. male with very extensive medical hx which includes mantle cell lymphoma, essential hypertension, obstructive sleep apnea, CAD status post CABG. Per wife, pt with poor appetite x2 weeks PTA but previously eating very well.  In rounds this AM, RN reported that wife stated pt has been very weak and had a very poor appetite PTA. Pt's wife reports all information during discussion.  Pt typically has a very good appetite and eats well. For the past two weeks his appetite has declined and he has only been eating a few bites at most meals. He does not have problems with chewing or swallowing. He did not seem to have any abdominal pain or nausea PTA.   Pt sometimes drinks Boost at home. Informed wife that MD has placed order for Boost BID and she states she will make sure pt drinks some throughout each day. Visualized lunch tray with ~50% completion and wife reports that this is more than he has been eating recently.  Likely not meeting needs currently. No signs of malnutrition at this time. Wife reports UBW of 191 lbs. Will monitor weight trends for pt on Lasix. Labs reviewed; K: 2.9 mmol/L and trending down, creatinine now trending down, GFR: 40.  Height: Ht Readings from Last 1 Encounters:  12/14/14 5\' 9"  (1.753 m)    Weight: Wt Readings from Last 1 Encounters:  12/15/14 182 lb 5.1 oz (82.7 kg)    Ideal Body Weight: 145 lbs  (65.91 kg)  % Ideal Body Weight: 125.5%  Wt Readings from Last 10 Encounters:  12/15/14 182 lb 5.1 oz (82.7 kg)  11/17/14 192 lb 11.2 oz (87.408 kg)  10/20/14 189 lb 12.8 oz (86.093 kg)  10/20/14 189 lb 12.8 oz (86.093 kg)  09/27/14 189 lb 14.4 oz (86.138 kg)  09/20/14 187 lb 6.4 oz (85.004 kg)  09/09/14 189 lb 12.8 oz (86.093 kg)  09/02/14 187 lb 2.7 oz (84.9 kg)  08/23/14 209 lb 8 oz (95.029 kg)  08/10/14 207 lb (93.895 kg)    Usual Body Weight: 191 lbs (86.82 kg)  % Usual Body Weight: 95%  BMI:  Body mass index is 26.91 kg/(m^2).  Estimated Nutritional Needs: Kcal: 1600-1800 Protein: 80-100 grams Fluid: 1.5L/day  Skin: WDL  Diet Order: Diet Heart Room service appropriate?: Yes; Fluid consistency:: Thin  EDUCATION NEEDS: -No education needs identified at this time   Intake/Output Summary (Last 24 hours) at 12/15/14 1322 Last data filed at 12/15/14 1215  Gross per 24 hour  Intake     30 ml  Output   1700 ml  Net  -1670 ml    Last BM: PTA   Labs:   Recent Labs Lab 12/14/14 1654 12/14/14 2238 12/15/14 0442  NA 138  --  141  K 3.4*  --  2.9*  CL 102  --  100  CO2 28  --  28  BUN 24*  --  22  CREATININE 1.73* 1.70* 1.56*  CALCIUM 8.3*  --  8.8  MG  --   --  1.9  GLUCOSE 96  --  79    CBG (last 3)  No results for input(s): GLUCAP in the last 72 hours.  Scheduled Meds: . atenolol  25 mg Oral Daily  . [START ON 12/16/2014] azithromycin  250 mg Oral Daily  . citalopram  20 mg Oral Daily  . dextromethorphan-guaiFENesin  1 tablet Oral BID  . enoxaparin (LOVENOX) injection  30 mg Subcutaneous QHS  . furosemide  40 mg Intravenous BID  . ibrutinib  560 mg Oral Daily  . isosorbide mononitrate  90 mg Oral Daily  . pantoprazole  40 mg Oral BID  . pravastatin  80 mg Oral Daily  . sodium chloride  3 mL Intravenous Q12H    Continuous Infusions:   Past Medical History  Diagnosis Date  . Hypertension   . CAD 11/12/2007    a. s/p CABG;  b. Cath 7/11  Patent LIMA to the LAD. Previously known occlusion of a saphenous vein graft to diagonal and sequential to obtuse marginals. Severe diffuse native obtuse marginal and diagonal branch vessel disease. Preserved ejection fraction.;  c. 03/2013 Lexi CL: mild inflat and inf ischemia, EF 54%->initial med rx.  Marland Kitchen HYPERLIPIDEMIA 11/12/2007  . HYPERTENSION 11/12/2007  . PERIPHERAL VASCULAR DISEASE 11/12/2007  . ARTHRITIS 11/12/2007  . BENIGN PROSTATIC HYPERTROPHY, HX OF 11/12/2007  . Edema 10/17/2010  . INGUINAL HERNIAS, BILATERAL 11/10/2006  . Obesity, unspecified 07/05/2009  . SLEEP APNEA 11/12/2007  . History of PTCA 2005  . Osteoarthritis   . Carotid stenosis, bilateral   . Nephrolithiasis   . thymus ca dx'd 2003    Radiation comp 2003  . NHL (non-Hodgkin's lymphoma) dx'd 2008    Chemo comp 10/2010; rituxin comp 10/2010  . 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 MANTLE CELL LYMPHOMA INTRA-ABDOMINAL LYMPH NODES 11/12/2007  . Tricuspid valve regurgitation   . Prerenal renal failure 09/20/2014  . Acute on chronic diastolic CHF (congestive heart failure) 08/28/2014  . E. coli UTI 08/28/2014  . Fracture of femoral neck, left 12/09/2013  . Acute blood loss anemia 12/10/2013  . Hemorrhagic shock 08/05/2014  . Syncope 08/05/2014  . Cough 11/17/2014    Past Surgical History  Procedure Laterality Date  . Coronary artery bypass graft    . Cardiac catheterization  08/2009, 02/2010  . Total hip arthroplasty Left 12/09/2013    Procedure: TOTAL HIP ARTHROPLASTY;  Surgeon: Mauri Pole, MD;  Location: [facility];  Service: Orthopedics;  Laterality: Left;  . Esophagogastroduodenoscopy N/A 08/02/2014    Procedure: ESOPHAGOGASTRODUODENOSCOPY (EGD) at bedside;  Surgeon: Irene Shipper, MD;  Location: [facility];  Service: Endoscopy;  Laterality: N/A;  . Esophagogastroduodenoscopy (egd) with propofol N/A 08/10/2014    Procedure: ESOPHAGOGASTRODUODENOSCOPY (EGD) WITH PROPOFOL;  Surgeon: Milus Banister, MD;  Location: [facility];  Service:  Endoscopy;  Laterality: N/A;    Jarome Matin, RD, LDN Inpatient Clinical Dietitian Pager # [phone] After hours/weekend pager # [phone]

## 2014-12-15 NOTE — Progress Notes (Signed)
*  PRELIMINARY RESULTS* Vascular Ultrasound Lower extremity venous duplex has been completed.  Preliminary findings: No DVT noted in visualized veins.  Landry Mellow, RDMS, RVT  12/15/2014, 9:26 AM

## 2014-12-16 DIAGNOSIS — N182 Chronic kidney disease, stage 2 (mild): Secondary | ICD-10-CM | POA: Diagnosis not present

## 2014-12-16 DIAGNOSIS — I5033 Acute on chronic diastolic (congestive) heart failure: Secondary | ICD-10-CM

## 2014-12-16 DIAGNOSIS — J9601 Acute respiratory failure with hypoxia: Secondary | ICD-10-CM | POA: Diagnosis not present

## 2014-12-16 DIAGNOSIS — I129 Hypertensive chronic kidney disease with stage 1 through stage 4 chronic kidney disease, or unspecified chronic kidney disease: Secondary | ICD-10-CM | POA: Diagnosis not present

## 2014-12-16 DIAGNOSIS — E876 Hypokalemia: Secondary | ICD-10-CM | POA: Diagnosis not present

## 2014-12-16 DIAGNOSIS — I509 Heart failure, unspecified: Secondary | ICD-10-CM | POA: Diagnosis not present

## 2014-12-16 LAB — BASIC METABOLIC PANEL
ANION GAP: 9 (ref 5–15)
BUN: 24 mg/dL — AB (ref 6–23)
CHLORIDE: 99 mmol/L (ref 96–112)
CO2: 32 mmol/L (ref 19–32)
Calcium: 8.4 mg/dL (ref 8.4–10.5)
Creatinine, Ser: 1.52 mg/dL — ABNORMAL HIGH (ref 0.50–1.35)
GFR calc Af Amer: 48 mL/min — ABNORMAL LOW (ref >90)
GFR calc non Af Amer: 42 mL/min — ABNORMAL LOW (ref >90)
Glucose, Bld: 86 mg/dL (ref 70–99)
POTASSIUM: 3 mmol/L — AB (ref 3.5–5.1)
Sodium: 140 mmol/L (ref 135–145)

## 2014-12-16 LAB — BLOOD GAS, ARTERIAL
Acid-Base Excess: 7.6 mmol/L — ABNORMAL HIGH (ref 0.0–2.0)
Bicarbonate: 31.7 mEq/L — ABNORMAL HIGH (ref 20.0–24.0)
Drawn by: 307971
O2 Content: 2 L/min
O2 Saturation: 94.4 %
PATIENT TEMPERATURE: 37
PCO2 ART: 43.2 mmHg (ref 35.0–45.0)
PH ART: 7.479 — AB (ref 7.350–7.450)
TCO2: 28.2 mmol/L (ref 0–100)
pO2, Arterial: 73.6 mmHg — ABNORMAL LOW (ref 80.0–100.0)

## 2014-12-16 LAB — CLOSTRIDIUM DIFFICILE BY PCR: Toxigenic C. Difficile by PCR: NEGATIVE

## 2014-12-16 MED ORDER — POTASSIUM CHLORIDE CRYS ER 20 MEQ PO TBCR
40.0000 meq | EXTENDED_RELEASE_TABLET | Freq: Once | ORAL | Status: AC
  Administered 2014-12-16: 40 meq via ORAL
  Filled 2014-12-16: qty 2

## 2014-12-16 MED ORDER — POTASSIUM CHLORIDE CRYS ER 20 MEQ PO TBCR: 40.0000 meq | EXTENDED_RELEASE_TABLET | Freq: Once | ORAL | Status: DC

## 2014-12-16 NOTE — Evaluation (Signed)
Physical Therapy Evaluation Patient Details Name: Martin Morris MRN: [id number] DOB: [birth year] Today's Date: 12/16/2014   History of Present Illness  Martin Morris is an 81 y.o. male with a PMH of L hip fx April 2015 (per pt report), HTN, CAD, hyperlipidemia, PVD, BPH, OSA, NHL status post chemo/XRT who presented to [facility] ED with SOB and BLE edema. Dx of acute on chronic CHF.   Clinical Impression  Pt admitted with above diagnosis. Pt currently with functional limitations due to the deficits listed below (see PT Problem List). Pt required mod to max assist for getting OOB to recliner. He was very lethargic (possibly due to medication per wife)  which limited assessment of his mobility. SaO2 85% on RA at rest, 95% on 2L O2 [place] at rest. Pt will benefit from skilled PT to increase their independence and safety with mobility to allow discharge to the venue listed below.       Follow Up Recommendations Home health PT;Supervision/Assistance - 24 hour    Equipment Recommendations  Other (comment) (transfer tub bench)    Recommendations for Other Services OT consult     Precautions / Restrictions Precautions Precautions: Fall Precaution Comments: monitor O2 Restrictions Weight Bearing Restrictions: No      Mobility  Bed Mobility Overal bed mobility: Needs Assistance Bed Mobility: Supine to Sit     Supine to sit: Max assist     General bed mobility comments: assist to raise trunk, verbal cues for technique  Transfers Overall transfer level: Needs assistance Equipment used: Rolling walker (2 wheeled) Transfers: Sit to/from [facility] Sit to Stand: Mod assist Stand pivot transfers: Mod assist       General transfer comment: assist to rise, and steady; SaO2 85% on RA at rest, 95% on 2L O2 at rest  Ambulation/Gait             General Gait Details: NT- pt lethargic  Stairs            Wheelchair Mobility    Modified Rankin (Stroke Patients  Only)       Balance Overall balance assessment: Needs assistance   Sitting balance-Leahy Scale: Fair       Standing balance-Leahy Scale: Poor                               Pertinent Vitals/Pain Pain Assessment: No/denies pain    Home Living Family/patient expects to be discharged to:: Private residence Living Arrangements: Spouse/significant other Available Help at Discharge: Family;Available 24 hours/day Type of Home: House Home Access: Stairs to enter Entrance Stairs-Rails: Right Entrance Stairs-Number of Steps: 3 Home Layout: One level;Laundry or work area in [place]: Environmental consultant - 2 wheels      Prior Function Level of Independence: Independent with assistive device(s)         Comments: walks with RW, tub shower, wife supervises shower, would like transfer tub bench     Hand Dominance   Dominant Hand: Right    Extremity/Trunk Assessment   Upper Extremity Assessment: Overall WFL for tasks assessed           Lower Extremity Assessment: Generalized weakness      Cervical / Trunk Assessment: Kyphotic  Communication   Communication: No difficulties  Cognition Arousal/Alertness: Awake/alert Behavior During Therapy: WFL for tasks assessed/performed Overall Cognitive Status: Within Functional Limits for tasks assessed  General Comments      Exercises        Assessment/Plan    PT Assessment Patient needs continued PT services  PT Diagnosis Generalized weakness;Difficulty walking   PT Problem List Decreased activity tolerance;Decreased strength;Decreased balance;Cardiopulmonary status limiting activity;Decreased mobility  PT Treatment Interventions Gait training;Functional mobility training;Therapeutic activities;Patient/family education;Therapeutic exercise;Balance training   PT Goals (Current goals can be found in the Care Plan section) Acute Rehab PT Goals Patient Stated Goal: to see great  granddaughter PT Goal Formulation: With patient/family Time For Goal Achievement: 12/30/14 Potential to Achieve Goals: Fair    Frequency Min 3X/week   Barriers to discharge        Co-evaluation               End of Session Equipment Utilized During Treatment: Gait belt Activity Tolerance: Patient limited by lethargy Patient left: in chair;with call bell/phone within reach;with family/visitor present Nurse Communication: Mobility status    Functional Assessment Tool Used: clinical judgement Functional Limitation: Mobility: Walking and moving around Mobility: Walking and Moving Around Current Status [phone]): At least 60 percent but less than 80 percent impaired, limited or restricted Mobility: Walking and Moving Around Goal Status [phone]): At least 1 percent but less than 20 percent impaired, limited or restricted    Time: 1125-1150 PT Time Calculation (min) (ACUTE ONLY): 25 min   Charges:   PT Evaluation $Initial PT Evaluation Tier I: 1 Procedure PT Treatments $Therapeutic Activity: 8-22 mins   PT G Codes:   PT G-Codes **NOT FOR INPATIENT CLASS** Functional Assessment Tool Used: clinical judgement Functional Limitation: Mobility: Walking and moving around Mobility: Walking and Moving Around Current Status (K8003): At least 60 percent but less than 80 percent impaired, limited or restricted Mobility: Walking and Moving Around Goal Status [phone]): At least 1 percent but less than 20 percent impaired, limited or restricted    Martin Morris 12/16/2014, 1:22 PM [phone]

## 2014-12-16 NOTE — Progress Notes (Signed)
CARE MANAGEMENT NOTE 12/16/2014  Patient:  EATON, FOLMAR   Account Number:  [id number]  Date Initiated:  12/16/2014  Documentation initiated by:  Karl Bales  Subjective/Objective Assessment:   PT ADMITTED WITH CHF     Action/Plan:   FROM HOME   Anticipated DC Date:  12/16/2014   Anticipated DC Plan:  [place]  CM consult      [facility] Choice  HOME HEALTH   Choice offered to / List presented to:  C-3 Spouse        HH arranged  HH-1 RN  [place].   Status of service:  In process, will continue to follow Medicare Important Message given?   (If response is "NO", the following Medicare IM given date fields will be blank) Date Medicare IM given:   Medicare IM given by:   Date Additional Medicare IM given:   Additional Medicare IM given by:    Discharge Disposition:    Per UR Regulation:  Reviewed for med. necessity/level of care/duration of stay  If discussed at [place] of Stay Meetings, dates discussed:    Comments:  12/15/14 MMcGibboney, RN, BSN Chart reviewed. Spoke with pt and wife concerning HH needs. Wife selected [facility], referral given to in house rep.

## 2014-12-16 NOTE — Consult Note (Signed)
   [facility] CM Inpatient Consult   12/16/2014  HEZEKIAH VELTRE 1933-07-04 [id number]   Received referral from inpatient RNCM for [place] Management services. Patient not eligible for [facility] Care Management at this time as her insurance is not in a [facility] affiliated contract. Will make inpatient RNCM aware.   Marthenia Rolling, MSN-Ed, RN,BSN [facility] Liaison [phone]

## 2014-12-16 NOTE — Progress Notes (Addendum)
Primary Physician: Primary Cardiologist:  Hochrein  HPI: Martin Morris is an 81 yo who we are asked to see ree CHF  patinet followed by [name] in clinic  Last seen in Feb 2016. Known CAD  disease. Most recent cardiac catheterization in August of 2014 demonstrated LAD proximal 90% stenosis, mid long severe diffuse disease. Ostial disease and a moderate first diagonal. Ostial disease in the second diagonal. The nondominant right coronary with 90% stenosis. LIMA to the LAD had 70% stenosis in the body of it. Vein graft to the OM and PDA was occluded. Vein graft to the diagonal is occluded. He has a history of diastolic CHF In December/JAn had GI bleed with shock, CVA and NSTEMI  The patient was admitted on 4/20 wht worsening SOB and LE edema.  Had sore throat, cough with darker sputum.  Wife says that has been developiing for about 2 wks Wt up 4 lbs athome   No CP  On admit   CXR with vasc congestion and small effusions  Patinet was started on IV lasix with diuresis Wife says yesterday he coughed a lot This morning he was better  Alert  Cough resolved. Then became very sleepy  Didn't eat much because of this  Has had to be aroused for questioning all afernoon  Echo this admit showed LVEF 50 to 55%.       Past Medical History  Diagnosis Date  . Hypertension   . CAD 11/12/2007    a. s/p CABG;  b. Cath 7/11 Patent LIMA to the LAD. Previously known occlusion of a saphenous vein graft to diagonal and sequential to obtuse marginals. Severe diffuse native obtuse marginal and diagonal branch vessel disease. Preserved ejection fraction.;  c. 03/2013 Lexi CL: mild inflat and inf ischemia, EF 54%->initial med rx.  Marland Kitchen HYPERLIPIDEMIA 11/12/2007  . HYPERTENSION 11/12/2007  . PERIPHERAL VASCULAR DISEASE 11/12/2007  . ARTHRITIS 11/12/2007  . BENIGN PROSTATIC HYPERTROPHY, HX OF 11/12/2007  . Edema 10/17/2010  . INGUINAL HERNIAS, BILATERAL 11/10/2006  . Obesity, unspecified 07/05/2009  . SLEEP APNEA  11/12/2007  . History of PTCA 2005  . Osteoarthritis   . Carotid stenosis, bilateral   . Nephrolithiasis   . thymus ca dx'd 2003    Radiation comp 2003  . NHL (non-Hodgkin's lymphoma) dx'd 2008    Chemo comp 10/2010; rituxin comp 10/2010  . 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 MANTLE CELL LYMPHOMA INTRA-ABDOMINAL LYMPH NODES 11/12/2007  . Tricuspid valve regurgitation   . Prerenal renal failure 09/20/2014  . Acute on chronic diastolic CHF (congestive heart failure) 08/28/2014  . E. coli UTI 08/28/2014  . Fracture of femoral neck, left 12/09/2013  . Acute blood loss anemia 12/10/2013  . Hemorrhagic shock 08/05/2014  . Syncope 08/05/2014  . Cough 11/17/2014    Medications Prior to Admission  Medication Sig Dispense Refill  . amLODipine (NORVASC) 5 MG tablet Take 1 tablet (5 mg total) by mouth daily. 30 tablet 11  . atenolol (TENORMIN) 25 MG tablet Take 1 tablet (25 mg total) by mouth daily. 30 tablet 11  . cephALEXin (KEFLEX) 500 MG capsule Take 500 mg by mouth daily.    . citalopram (CELEXA) 20 MG tablet TAKE ONE TABLET BY MOUTH ONCE DAILY 30 tablet 2  . furosemide (LASIX) 40 MG tablet Take 20 mg by mouth daily.     Marland Kitchen HYDROcodone-acetaminophen (NORCO/VICODIN) 5-325 MG per tablet Take 1 tablet by mouth every 6 (six) hours as needed for moderate pain. 90 tablet 0  .  ibrutinib (IMBRUVICA) 140 MG capsul Take 4 capsules (560 mg total) by mouth daily. 120 capsule 9  . isosorbide mononitrate (IMDUR) 30 MG 24 hr tablet Take 3 tablets (90 mg total) by mouth daily. 90 tablet 5  . lactose free nutrition (BOOST PLUS) LIQD Take 237 mLs by mouth 2 (two) times daily as needed (Allow w/pt or pt's wife request after diet advancement). (Patient taking differently: Take 237 mLs by mouth 2 (two) times daily as needed (nutritional supplement). ) 60 Can 0  . nitroGLYCERIN (NITROSTAT) 0.4 MG SL tablet Place 1 tablet (0.4 mg total) under the tongue every 5 (five) minutes as needed for chest pain. 25 tablet prn  . pantoprazole  (PROTONIX) 40 MG tablet Take 1 tablet (40 mg total) by mouth 2 (two) times daily. 90 tablet 3  . pravastatin (PRAVACHOL) 80 MG tablet Take 1 tablet (80 mg total) by mouth daily. 30 tablet 11  . guaiFENesin-codeine 100-10 MG/5ML syrup Take 5 mLs by mouth 3 (three) times daily as needed for cough. 180 mL 1  . methocarbamol (ROBAXIN) 500 MG tablet Take 1 tablet (500 mg total) by mouth every 6 (six) hours as needed for muscle spasms. 50 tablet 0     . atenolol  25 mg Oral Daily  . azithromycin  250 mg Oral Daily  . chlorpheniramine-HYDROcodone  5 mL Oral Q12H  . citalopram  20 mg Oral Daily  . dextromethorphan-guaiFENesin  1 tablet Oral BID  . enoxaparin (LOVENOX) injection  40 mg Subcutaneous QHS  . ibrutinib  560 mg Oral Daily  . isosorbide mononitrate  90 mg Oral Daily  . pantoprazole  40 mg Oral BID  . potassium chloride  40 mEq Oral Once  . pravastatin  80 mg Oral Daily  . sodium chloride  3 mL Intravenous Q12H    Infusions:    Allergies  Allergen Reactions  . Atorvastatin Other (See Comments)    Incredible dizziness    History   Social History  . Marital Status: Married    Spouse Name: [name]  . Number of Children: 2  . Years of Education: N/A   Occupational History  . Retired Warehouse manager    Social History Main Topics  . Smoking status: Never Smoker   . Smokeless tobacco: Never Used  . Alcohol Use: No  . Drug Use: No  . Sexual Activity: Not on file   Other Topics Concern  . Not on file   Social History Narrative   The patient lives in [place] with his wife. He is a retired Engineer, agricultural. No tobacco or alcohol use.  Ambulates with a walker.      Family History  Problem Relation Age of Onset  . Coronary artery disease Father     died at 88  . Heart attack Mother     died at age 48  . Heart attack Brother     REVIEW OF SYSTEMS:  All systems reviewed  Negative to the above problem except as noted above.    PHYSICAL EXAM: Filed Vitals:   12/16/14 1420    BP: 96/55  Pulse: 55  Temp: 97.4 F (36.3 C)  Resp: 19   O2 sat 96% on 2L   Intake/Output Summary (Last 24 hours) at 12/16/14 1533 Last data filed at 12/16/14 1532  Gross per 24 hour  Intake    360 ml  Output   2628 ml  Net  -2268 ml     General:  Patinet is sleeping  Wakes  up and opens eyes  Goes back to sleep   HEENT: normal  R eye with secretions  Closed   Neck: supple. Neck full   Carotids 2+ bilat; no bruits. No lymphadenopathy or thryomegaly appreciated. Cor: PMI nondisplaced. Regular rate & rhythm. No rubs, gallops or murmurs. Lungs: MIld rhonchi Abdomen: soft, nontender, nondistended. No hepatosplenomegaly. No bruits or masses. Good bowel sounds. Extremities: no cyanosis, clubbing, rash, edema Neuro: Opens eyes when rub him then back to sleep  Says grandchild is "my baby"  Can't name wife  Nods no.   Can move all extremities    ECG:  Sinus bradycardia  58 bpm   Tele with 1 burst NSVT    Results for orders placed or performed during the hospital encounter of 12/14/14 (from the past 24 hour(s))  Basic metabolic panel     Status: Abnormal   Collection Time: 12/16/14  5:00 AM  Result Value Ref Range   Sodium 140 135 - 145 mmol/L   Potassium 3.0 (L) 3.5 - 5.1 mmol/L   Chloride 99 96 - 112 mmol/L   CO2 32 19 - 32 mmol/L   Glucose, Bld 86 70 - 99 mg/dL   BUN 24 (H) 6 - 23 mg/dL   Creatinine, Ser 1.52 (H) 0.50 - 1.35 mg/dL   Calcium 8.4 8.4 - 10.5 mg/dL   GFR calc non Af Amer 42 (L) >90 mL/min   GFR calc Af Amer 48 (L) >90 mL/min   Anion gap 9 5 - 15   Dg Chest 2 View  12/15/2014   CLINICAL DATA:  Shortness of Breath, lower extremity swelling, history of mantle cell lymphoma  EXAM: CHEST  2 VIEW  COMPARISON:  12/14/2014  FINDINGS: Right chest wall port is again identified. The cardiac shadow is stable. Postoperative changes are again seen.Vascular congestion has improved somewhat in the interval. Mild left basilar atelectasis is noted. No focal confluent infiltrate  is seen  IMPRESSION: Improvement in degree of vascular congestion. Left basilar atelectasis is noted.   Electronically Signed   By: Inez Catalina M.D.   On: 12/15/2014 10:28   Dg Chest 2 View  12/14/2014   CLINICAL DATA:  Shortness of Breath  EXAM: CHEST  2 VIEW  COMPARISON:  10/08/2014  FINDINGS: Cardiac shadow is stable. A right-sided chest wall port is again seen. Lungs are well aerated bilaterally and demonstrate mild vascular congestion. No focal confluent infiltrate is seen. Small pleural effusions are noted bilaterally.  IMPRESSION: Vascular congestion small pleural effusions consistent with CHF.   Electronically Signed   By: Inez Catalina M.D.   On: 12/14/2014 18:15     ASSESSMENT:  81 yo with distolic CHF  Admitted iwht productive cough, increased SOB and wt gain Has been treated with ABX and IV lasix  Diuresing. Today sats low on RA (88%) ON exam, BP is soft.  Sats on 2L OK  HIs breathing is periodic, apneic at times  JVP may be mildly increased   Most signfi if pt's degree of somnolence (only received Tussinex x 1 last night) CXR from yesterday not optimal but shows some vasc congestion  Impression  1.  Diastolic CHF  VOlume status may still be mildly increased but I do not think explains hypoxia  I Think this is still due to poor inspiration from MS change   No lasix given today  WOuld give 1 dose in AM  I will hold on writing order   Continue strict I/O  Check labs including  BNP in AM    WOuld get good CXR (PA/Lat ) again prior to D/c    2  Hypoxia. Sats improved on [place]  On observation he is apneic at times  May explain some of problem  I don't think volume explains.  2.  Neuro  Discussed with Dr Wynelle Cleveland.  Wife reports pt had headache for past 2 wks  Took pain meds  New for him .  WIll defer evaluation to IM  3.  Pulm  On ABX for URI  4.  CAD  I am not convinced of active angina  5.  HL COntinue statin.

## 2014-12-16 NOTE — Progress Notes (Signed)
Echocardiogram 2D Echocardiogram has been performed.  Martin Morris 12/16/2014, 1:27 PM

## 2014-12-17 DIAGNOSIS — J209 Acute bronchitis, unspecified: Secondary | ICD-10-CM

## 2014-12-17 DIAGNOSIS — I5033 Acute on chronic diastolic (congestive) heart failure: Secondary | ICD-10-CM | POA: Diagnosis not present

## 2014-12-17 DIAGNOSIS — N182 Chronic kidney disease, stage 2 (mild): Secondary | ICD-10-CM | POA: Diagnosis not present

## 2014-12-17 DIAGNOSIS — J9601 Acute respiratory failure with hypoxia: Secondary | ICD-10-CM | POA: Diagnosis not present

## 2014-12-17 LAB — BASIC METABOLIC PANEL
ANION GAP: 9 (ref 5–15)
BUN: 27 mg/dL — ABNORMAL HIGH (ref 6–23)
CHLORIDE: 99 mmol/L (ref 96–112)
CO2: 34 mmol/L — ABNORMAL HIGH (ref 19–32)
Calcium: 8.5 mg/dL (ref 8.4–10.5)
Creatinine, Ser: 1.56 mg/dL — ABNORMAL HIGH (ref 0.50–1.35)
GFR, EST AFRICAN AMERICAN: 47 mL/min — AB (ref >90)
GFR, EST NON AFRICAN AMERICAN: 40 mL/min — AB (ref >90)
Glucose, Bld: 74 mg/dL (ref 70–99)
POTASSIUM: 3.8 mmol/L (ref 3.5–5.1)
SODIUM: 142 mmol/L (ref 135–145)

## 2014-12-17 MED ORDER — HYDROCODONE-ACETAMINOPHEN 5-325 MG PO TABS: 1.0000 | ORAL_TABLET | Freq: Four times a day (QID) | ORAL | Status: AC | PRN

## 2014-12-17 MED ORDER — AZITHROMYCIN 250 MG PO TABS: 250.0000 mg | ORAL_TABLET | Freq: Every day | ORAL | Status: DC

## 2014-12-17 MED ORDER — HEPARIN SOD (PORK) LOCK FLUSH 100 UNIT/ML IV SOLN
500.0000 [IU] | INTRAVENOUS | Status: AC | PRN
  Administered 2014-12-17: 500 [IU]

## 2014-12-17 MED ORDER — FUROSEMIDE 20 MG PO TABS
20.0000 mg | ORAL_TABLET | Freq: Every day | ORAL | Status: DC
  Administered 2014-12-17: 20 mg via ORAL
  Filled 2014-12-17: qty 1

## 2014-12-17 MED ORDER — HYDROCOD POLST-CPM POLST ER 10-8 MG/5ML PO SUER: 5.0000 mL | Freq: Two times a day (BID) | ORAL | Status: AC | PRN

## 2014-12-17 NOTE — Progress Notes (Signed)
reivewed discharge instructions and heart failture education with patient and wife with teachback. Agreeable to discharge, dme delivered.  Barbee Shropshire. Brigitte Pulse, RN

## 2014-12-17 NOTE — Progress Notes (Signed)
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  He denies any chest pain.  He states that his shortness of breath has improved.  He is lying flat on his back with no significant dyspnea.  His rhythm is normal sinus rhythm  CURRENT MEDS . atenolol  25 mg Oral Daily  . azithromycin  250 mg Oral Daily  . chlorpheniramine-HYDROcodone  5 mL Oral Q12H  . citalopram  20 mg Oral Daily  . dextromethorphan-guaiFENesin  1 tablet Oral BID  . enoxaparin (LOVENOX) injection  40 mg Subcutaneous QHS  . furosemide  20 mg Oral Daily  . ibrutinib  560 mg Oral Daily  . isosorbide mononitrate  90 mg Oral Daily  . pantoprazole  40 mg Oral BID  . potassium chloride  40 mEq Oral Once  . pravastatin  80 mg Oral Daily  . sodium chloride  3 mL Intravenous Q12H    OBJECTIVE  Filed Vitals:   12/16/14 1309 12/16/14 1420 12/16/14 2143 12/17/14 0518  BP:  96/55 112/57 145/56  Pulse:  55 64 52  Temp:  97.4 F (36.3 C) 97.6 F (36.4 C) 97.5 F (36.4 C)  TempSrc:  Oral Oral Oral  Resp:  19 20 20   Height:      Weight:    169 lb 12.1 oz (77 kg)  SpO2: 85% 94% 93% 94%    Intake/Output Summary (Last 24 hours) at 12/17/14 0756 Last data filed at 12/17/14 0520  Gross per 24 hour  Intake    250 ml  Output   1075 ml  Net   -825 ml   Filed Weights   12/15/14 1100 12/16/14 0548 12/17/14 0518  Weight: 182 lb 5.1 oz (82.7 kg) 176 lb 2.4 oz (79.9 kg) 169 lb 12.1 oz (77 kg)    PHYSICAL EXAM  General: Pleasant, NAD. HEENT:  Normal  Neck: Supple without bruits or JVD. Lungs:  Resp regular and  unlabored, CTA. Heart: RRR no s3, s4, or murmurs. Abdomen: Soft, non-tender, non-distended, BS + x 4.  Extremities: No clubbing, cyanosis.  Skin changes of stasis dermatitis.  Trace edema.  DP/PT/Radials 2+ and equal bilaterally.  Accessory Clinical Findings  CBC  Recent Labs  12/14/14 1654 12/14/14 2238  WBC 5.3 5.0  NEUTROABS 1.3*  --   HGB 10.1* 10.3*  HCT 32.2* 32.7*  MCV 88.2 87.7  PLT 171 193   Basic Metabolic Panel  Recent Labs  12/15/14 0442 12/16/14 0500 12/17/14 0510  NA 141 140 142  K 2.9* 3.0* 3.8  CL 100 99 99  CO2 28 32 34*  GLUCOSE 79 86 74  BUN 22 24* 27*  CREATININE 1.56* 1.52* 1.56*  CALCIUM 8.8 8.4 8.5  MG 1.9  --   --    Liver Function Tests  Recent Labs  12/14/14 2238 12/15/14 0442  AST 17 17  ALT 8 7  ALKPHOS 87 90  BILITOT 1.3* 1.3*  PROT 5.8* 5.9*  ALBUMIN 3.0* 3.2*   No results for input(s): LIPASE, AMYLASE in the last 72 hours. Cardiac Enzymes  Recent Labs  12/14/14 2238 12/15/14 0442 12/15/14 1215  TROPONINI <0.03 <0.03 <0.03   BNP Invalid input(s): POCBNP D-Dimer No results for input(s): DDIMER in the last 72 hours. Hemoglobin A1C No results for input(s): HGBA1C in the last 72 hours. Fasting Lipid Panel No results for input(s): CHOL, HDL, LDLCALC, TRIG, CHOLHDL, LDLDIRECT in the last 72 hours. Thyroid Function Tests  Recent Labs  12/15/14 0442  TSH 3.339    TELE  Normal sinus rhythm    2-D echo: - Left ventricle: The cavity size was normal. Wall thickness was normal. Systolic function was normal. The estimated ejection fraction was in the range of 50% to 55%. Regional wall motion abnormalities cannot be excluded. Doppler parameters are consistent with abnormal left ventricular relaxation (grade 1 diastolic dysfunction). - Aortic valve: There was trivial regurgitation. - Mitral valve: Calcified annulus. Mildly thickened leaflets . - Left atrium: The atrium was mildly to moderately  dilated. Radiology/Studies  Dg Chest 2 View  12/15/2014   CLINICAL DATA:  Shortness of Breath, lower extremity swelling, history of mantle cell lymphoma  EXAM: CHEST  2 VIEW  COMPARISON:  12/14/2014  FINDINGS: Right chest wall port is again identified. The cardiac shadow is stable. Postoperative changes are again seen.Vascular congestion has improved somewhat in the interval. Mild left basilar atelectasis is noted. No focal confluent infiltrate is seen  IMPRESSION: Improvement in degree of vascular congestion. Left basilar atelectasis is noted.   Electronically Signed   By: Inez Catalina M.D.   On: 12/15/2014 10:28   Dg Chest 2 View  12/14/2014   CLINICAL DATA:  Shortness of Breath  EXAM: CHEST  2 VIEW  COMPARISON:  10/08/2014  FINDINGS: Cardiac shadow is stable. A right-sided chest wall port is again seen. Lungs are well aerated bilaterally and demonstrate mild vascular congestion. No focal confluent infiltrate is seen. Small pleural effusions are noted bilaterally.  IMPRESSION: Vascular congestion small pleural effusions consistent with CHF.   Electronically Signed   By: Inez Catalina M.D.   On: 12/14/2014 18:15    ASSESSMENT AND PLAN  1. Diastolic CHF he appears almost euvolemic today.  I will start him back on his home dose of Lasix 20 mg by mouth daily  2 Hypoxia. Sats improved on [place] On observation he is apneic at times May explain some of problem I don't think volume explains.  2. Neurohe is more alert this morning.  Yesterday's lethargy may have been secondary to medications as suspected by Dr. Harrington Challenger  3. Pulm On ABX for URI  4. CAD.  Denies chest pain  Signed, Darlin Coco MD

## 2014-12-17 NOTE — Progress Notes (Signed)
Utilization Review completed.  

## 2014-12-17 NOTE — Discharge Summary (Signed)
Physician Discharge Summary  Martin Morris Martin Morris DOB: Martin Morris DOA: 12/14/2014  PCP: [name], MD  Admit date: 12/14/2014 Discharge date: 12/17/2014  Time spent: 50 minutes  Recommendations for Outpatient Follow-up:  1. 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 male with a past medical history of mantle cell lymphoma, essential hypertension, CAD status post CABG. He was sent from Dr. Murrell Redden office for shortness of breath. He also had complaints of cough with greenish colored sputum. Noted to have an oxygen saturation of 91% on room air. Also complained of increased swelling in his legs and shortness of breath on laying flat.   Hospital Course:  Principal Problem:  Acute on chronic diastolic heart failure -Was admitted for this in the past-[facility]Dr. Percival Spanish - Pulmonary edema noted on chest x-ray on admission with an elevated BNP with improvement in infiltrates noted on x-ray  -he was given given IV Lasix and is in negative balance by 5000 mL this morning - Last echo performed in 08/14/2014 revealed an EF of 55% and grade 1 diastolic dysfunction.- repeat echo shows the same - Have resumed his home dose of Lasix and have discussed the importance of daily weights and a low-sodium diet  Active Problems: Cough with congestion-acute bronchitis secondary resulting in hypoxic respiratory failure - As previously  requiring 2 L but this morning oxygen saturations are 94% on room air -He had a severe cough when initially admitted that has improved significantly -continue Zithromax to complete course- repeat chest x-ray after diuresis does not reveal a pneumonia   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 Alvy Bimler- appears to be responding to treatment with Ibrutinib per last note  Mildly elevated bilirubin -Not sure if this is related to his cancer-asymptomatic-follow   Essential hypertension -BP reasonably controlled continue Imdur and atenolol  Coronary artery disease -Was previously on aspirin but this has been discontinued due to GI bleed in 12/15   Chronic kidney disease (CKD), stage III  -Creatinine slightly improved with diuretics   Hypokalemia -Due to diuretics-replaced -magnesium normal  Malignant gastric ulcer with bleeding- EGD 12/15   Consultations:  Cardiology  Discharge Exam: Filed Weights   12/15/14 1100 12/16/14 0548 12/17/14 0518  Weight: 82.7 kg (182 lb 5.1 oz) 79.9 kg (176 lb 2.4 oz) 77 kg (169 lb 12.1 oz)   Filed Vitals:   12/17/14 1111  BP:   Pulse: 60  Temp:   Resp:     General: AAO x 3, no distress Cardiovascular: RRR, no murmurs  Respiratory: clear to auscultation bilaterally GI: soft, non-tender, non-distended, bowel sound positive  Discharge Instructions You were cared for by a hospitalist during your hospital stay. If you have any questions about your discharge medications or the care you received while you were in the hospital after you are discharged, you can call the unit and asked to speak with the hospitalist  on call if the hospitalist that took care of you is not available. Once you are discharged, your primary care physician will handle any further medical issues. Please note that NO REFILLS for any discharge medications will be authorized once you are  discharged, as it is imperative that you return to your primary care physician (or establish a relationship with a primary care physician if you do not have one) for your aftercare needs so that they can reassess your need for medications and monitor your lab values.      Discharge Instructions    Diet - low sodium heart healthy    Complete by:  As directed      Increase activity slowly    Complete by:  As directed             Medication List    STOP taking these medications        cephALEXin 500 MG capsule  Commonly known as:  KEFLEX     guaiFENesin-codeine 100-10 MG/5ML syrup      TAKE these medications        amLODipine 5 MG tablet  Commonly known as:  NORVASC  Take 1 tablet (5 mg total) by mouth daily.     atenolol 25 MG tablet  Commonly known as:  TENORMIN  Take 1 tablet (25 mg total) by mouth daily.     azithromycin 250 MG tablet  Commonly known as:  ZITHROMAX  Take 1 tablet (250 mg total) by mouth daily.     chlorpheniramine-HYDROcodone 10-8 MG/5ML Suer  Commonly known as:  TUSSIONEX  Take 5 mLs by mouth every 12 (twelve) hours as needed for cough.     citalopram 20 MG tablet  Commonly known as:  CELEXA  TAKE ONE TABLET BY MOUTH ONCE DAILY     furosemide 40 MG tablet  Commonly known as:  LASIX  Take 20 mg by mouth daily.     HYDROcodone-acetaminophen 5-325 MG per tablet  Commonly known as:  NORCO/VICODIN  Take 1 tablet by mouth every 6 (six) hours as needed for moderate pain.     ibrutinib 140 MG capsul  Commonly known as:  IMBRUVICA  Take 4 capsules (560 mg total) by mouth daily.     isosorbide mononitrate 30 MG 24 hr tablet  Commonly known as:  IMDUR  Take 3 tablets (90 mg total) by mouth daily.     lactose free nutrition Liqd  Take 237 mLs by mouth 2 (two) times daily as needed (Allow w/pt or pt's wife request after diet advancement).     methocarbamol 500 MG tablet  Commonly known as:  ROBAXIN  Take 1 tablet (500 mg total) by mouth every 6  (six) hours as needed for muscle spasms.     nitroGLYCERIN 0.4 MG SL tablet  Commonly known as:  NITROSTAT  Place 1 tablet (0.4 mg total) under the tongue every 5 (five) minutes as needed for chest pain.     pantoprazole 40 MG tablet  Commonly known as:  PROTONIX  Take 1 tablet (40 mg total) by mouth 2 (two) times daily.     pravastatin 80 MG tablet  Commonly known as:  PRAVACHOL  Take 1 tablet (80 mg total) by mouth daily.       Allergies  Allergen Reactions  . Atorvastatin Other (See Comments)    Incredible dizziness   Follow-up Information    Follow up with .   Why:  Home Health Physical Therapy  Contact information:   [address] [place] [address] [phone]        The results of significant diagnostics from this hospitalization (including imaging, microbiology, ancillary and laboratory) are listed below for reference.    Significant Diagnostic Studies: Dg Chest 2 View  12/15/2014   CLINICAL DATA:  Shortness of Breath, lower extremity swelling, history of mantle cell lymphoma  EXAM: CHEST  2 VIEW  COMPARISON:  12/14/2014  FINDINGS: Right chest wall port is again identified. The cardiac shadow is stable. Postoperative changes are again seen.Vascular congestion has improved somewhat in the interval. Mild left basilar atelectasis is noted. No focal confluent infiltrate is seen  IMPRESSION: Improvement in degree of vascular congestion. Left basilar atelectasis is noted.   Electronically Signed   By: Inez Catalina M.D.   On: 12/15/2014 10:28   Dg Chest 2 View  12/14/2014   CLINICAL DATA:  Shortness of Breath  EXAM: CHEST  2 VIEW  COMPARISON:  10/08/2014  FINDINGS: Cardiac shadow is stable. A right-sided chest wall port is again seen. Lungs are well aerated bilaterally and demonstrate mild vascular congestion. No focal confluent infiltrate is seen. Small pleural effusions are noted bilaterally.  IMPRESSION: Vascular congestion small  pleural effusions consistent with CHF.   Electronically Signed   By: Inez Catalina M.D.   On: 12/14/2014 18:15    Microbiology: Recent Results (from the past 240 hour(s))  Clostridium Difficile by PCR     Status: None   Collection Time: 12/15/14  3:28 PM  Result Value Ref Range Status   C difficile by pcr NEGATIVE NEGATIVE Final     Labs: Basic Metabolic Panel:  Recent Labs Lab 12/14/14 1654 12/14/14 2238 12/15/14 0442 12/16/14 0500 12/17/14 0510  NA 138  --  141 140 142  K 3.4*  --  2.9* 3.0* 3.8  CL 102  --  100 99 99  CO2 28  --  28 32 34*  GLUCOSE 96  --  79 86 74  BUN 24*  --  22 24* 27*  CREATININE 1.73* 1.70* 1.56* 1.52* 1.56*  CALCIUM 8.3*  --  8.8 8.4 8.5  MG  --   --  1.9  --   --    Liver Function Tests:  Recent Labs Lab 12/14/14 2238 12/15/14 0442  AST 17 17  ALT 8 7  ALKPHOS 87 90  BILITOT 1.3* 1.3*  PROT 5.8* 5.9*  ALBUMIN 3.0* 3.2*   No results for input(s): LIPASE, AMYLASE in the last 168 hours. No results for input(s): AMMONIA in the last 168 hours. CBC:  Recent Labs Lab 12/14/14 1654 12/14/14 2238  WBC 5.3 5.0  NEUTROABS 1.3*  --   HGB 10.1* 10.3*  HCT 32.2* 32.7*  MCV 88.2 87.7  PLT 171 185   Cardiac Enzymes:  Recent Labs Lab 12/14/14 1654 12/14/14 2238 12/15/14 0442 12/15/14 1215  TROPONINI <0.03 <0.03 <0.03 <0.03   BNP: BNP (last 3 results)  Recent Labs  08/31/14 0440 12/14/14 1654 12/15/14 0442  BNP 394.4* 125.3* 113.7*    ProBNP (last 3 results) No results for input(s): PROBNP in the last 8760 hours.  CBG: No results for input(s): GLUCAP in the last 168 hours.     SignedDebbe Odea, MD [facility] 12/17/2014, 12:36 PM

## 2014-12-17 NOTE — Progress Notes (Addendum)
CARE MANAGEMENT NOTE 12/17/2014  Patient:  Martin Morris, Martin Morris   Account Number:  [id number]  Date Initiated:  12/16/2014  Documentation initiated by:  Karl Bales  Subjective/Objective Assessment:   PT ADMITTED WITH CHF     Action/Plan:   FROM HOME   Anticipated DC Date:  12/16/2014   Anticipated DC Plan:  [place]  CM consult      [facility] Choice  HOME HEALTH   Choice offered to / List presented to:  C-3 Spouse   DME arranged  TUB BENCH      DME agency  [place] arranged  HH-1 RN  [place].   Status of service:  Completed, signed off Medicare Important Message given?  YES (If response is "NO", the following Medicare IM given date fields will be blank) Date Medicare IM given:  12/17/2014 Medicare IM given by:  [facility] Date Additional Medicare IM given:   Additional Medicare IM given by:    Discharge Disposition:  [place]  Per UR Regulation:  Reviewed for med. necessity/level of care/duration of stay  If discussed at [place] of Stay Meetings, dates discussed:    Comments:   12/17/2014 10:08 AM  NCM spoke to pt and gave permission to speak to wife, Mrs Seese. Requesting tub bench for home. Will pay out of pocket for DME. Contacted [facility] for tub bench for home. And to make aware of dc home with HHPT. Jonnie Finner RN CCM Case Mgmt phone [phone]     12/15/14 MMcGibboney, RN, BSN Chart reviewed. Spoke with pt and wife concerning HH needs. Wife selected [facility], referral given to in house rep.

## 2014-12-17 NOTE — Progress Notes (Deleted)
Reviewed discharge instructions with patient and family present. Patient verbalizes understading of reasons to call MD and prescriptions.

## 2015-01-05 ENCOUNTER — Ambulatory Visit (HOSPITAL_BASED_OUTPATIENT_CLINIC_OR_DEPARTMENT_OTHER): Payer: Medicare Other

## 2015-01-05 ENCOUNTER — Other Ambulatory Visit (HOSPITAL_BASED_OUTPATIENT_CLINIC_OR_DEPARTMENT_OTHER): Payer: Medicare Other

## 2015-01-05 ENCOUNTER — Telehealth: Payer: Self-pay | Admitting: Hematology and Oncology

## 2015-01-05 ENCOUNTER — Ambulatory Visit (HOSPITAL_BASED_OUTPATIENT_CLINIC_OR_DEPARTMENT_OTHER): Payer: Medicare Other | Admitting: Hematology and Oncology

## 2015-01-05 ENCOUNTER — Encounter: Payer: Self-pay | Admitting: Hematology and Oncology

## 2015-01-05 VITALS — BP 119/63 | HR 60 | Temp 97.8°F | Resp 17 | Ht 69.0 in | Wt 187.9 lb

## 2015-01-05 DIAGNOSIS — Z95828 Presence of other vascular implants and grafts: Secondary | ICD-10-CM

## 2015-01-05 DIAGNOSIS — N182 Chronic kidney disease, stage 2 (mild): Secondary | ICD-10-CM | POA: Diagnosis not present

## 2015-01-05 DIAGNOSIS — C8313 Mantle cell lymphoma, intra-abdominal lymph nodes: Secondary | ICD-10-CM

## 2015-01-05 DIAGNOSIS — D631 Anemia in chronic kidney disease: Secondary | ICD-10-CM | POA: Diagnosis not present

## 2015-01-05 DIAGNOSIS — D630 Anemia in neoplastic disease: Secondary | ICD-10-CM

## 2015-01-05 DIAGNOSIS — D702 Other drug-induced agranulocytosis: Secondary | ICD-10-CM

## 2015-01-05 DIAGNOSIS — R600 Localized edema: Secondary | ICD-10-CM

## 2015-01-05 LAB — COMPREHENSIVE METABOLIC PANEL (CC13)
ALBUMIN: 3.1 g/dL — AB (ref 3.5–5.0)
ALT: 8 U/L (ref 0–55)
AST: 12 U/L (ref 5–34)
Alkaline Phosphatase: 103 U/L (ref 40–150)
Anion Gap: 11 mEq/L (ref 3–11)
BUN: 19.9 mg/dL (ref 7.0–26.0)
CO2: 26 meq/L (ref 22–29)
Calcium: 8.5 mg/dL (ref 8.4–10.4)
Chloride: 105 mEq/L (ref 98–109)
Creatinine: 1.4 mg/dL — ABNORMAL HIGH (ref 0.7–1.3)
EGFR: 46 mL/min/{1.73_m2} — AB (ref >90)
GLUCOSE: 116 mg/dL (ref 70–140)
Potassium: 3.7 mEq/L (ref 3.5–5.1)
SODIUM: 141 meq/L (ref 136–145)
Total Bilirubin: 0.93 mg/dL (ref 0.20–1.20)
Total Protein: 5.4 g/dL — ABNORMAL LOW (ref 6.4–8.3)

## 2015-01-05 LAB — CBC WITH DIFFERENTIAL/PLATELET
BASO%: 0.9 % (ref 0.0–2.0)
Basophils Absolute: 0 10*3/uL (ref 0.0–0.1)
EOS ABS: 0.2 10*3/uL (ref 0.0–0.5)
EOS%: 3.3 % (ref 0.0–7.0)
HCT: 33.6 % — ABNORMAL LOW (ref 38.4–49.9)
HGB: 10.7 g/dL — ABNORMAL LOW (ref 13.0–17.1)
LYMPH#: 3.1 10*3/uL (ref 0.9–3.3)
LYMPH%: 66.3 % — ABNORMAL HIGH (ref 14.0–49.0)
MCH: 27.3 pg (ref 27.2–33.4)
MCHC: 31.8 g/dL — ABNORMAL LOW (ref 32.0–36.0)
MCV: 85.7 fL (ref 79.3–98.0)
MONO#: 1.1 10*3/uL — AB (ref 0.1–0.9)
MONO%: 23.8 % — AB (ref 0.0–14.0)
NEUT%: 5.7 % — ABNORMAL LOW (ref 39.0–75.0)
NEUTROS ABS: 0.3 10*3/uL — AB (ref 1.5–6.5)
Platelets: 191 10*3/uL (ref 140–400)
RBC: 3.92 10*6/uL — ABNORMAL LOW (ref 4.20–5.82)
RDW: 16.9 % — AB (ref 11.0–14.6)
WBC: 4.6 10*3/uL (ref 4.0–10.3)
nRBC: 0 % (ref 0–0)

## 2015-01-05 LAB — HOLD TUBE, BLOOD BANK

## 2015-01-05 MED ORDER — HEPARIN SOD (PORK) LOCK FLUSH 100 UNIT/ML IV SOLN
500.0000 [IU] | Freq: Once | INTRAVENOUS | Status: AC
  Administered 2015-01-05: 500 [IU] via INTRAVENOUS
  Filled 2015-01-05: qty 5

## 2015-01-05 MED ORDER — SODIUM CHLORIDE 0.9 % IJ SOLN
10.0000 mL | INTRAMUSCULAR | Status: DC | PRN
  Administered 2015-01-05: 10 mL via INTRAVENOUS
  Filled 2015-01-05: qty 10

## 2015-01-05 NOTE — Telephone Encounter (Signed)
Gave adn printedj appt sched anda vs for pt for May

## 2015-01-05 NOTE — Patient Instructions (Signed)

## 2015-01-06 NOTE — Progress Notes (Signed)
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 He is seen for further follow-up He denies recent exacerbation of CHF. No recent new neurological deficit The patient denies any recent signs or symptoms of bleeding such as spontaneous epistaxis, hematuria or hematochezia. He has chronic lower leg edema  REVIEW OF SYSTEMS:   Constitutional: Denies fevers, chills or abnormal weight loss Eyes: Denies blurriness of vision Ears, nose, mouth, throat, and face: Denies mucositis or sore throat Respiratory: Denies cough, dyspnea or wheezes Cardiovascular: Denies palpitation, chest discomfort Gastrointestinal:  Denies nausea, heartburn or change in bowel habits Skin: Denies abnormal skin rashes Lymphatics: Denies new lymphadenopathy or easy bruising Neurological:Denies numbness, tingling or new weaknesses Behavioral/Psych: Mood is stable, no new changes  All other systems were reviewed with the patient and are negative.  I have reviewed the past medical history, past surgical history, social history and family history with the patient and they are unchanged from previous note.  ALLERGIES:  is allergic to atorvastatin.  MEDICATIONS:  Current Outpatient Prescriptions  Medication Sig Dispense Refill  . amLODipine (NORVASC) 5 MG tablet Take 1 tablet (5 mg total) by mouth daily. 30 tablet 11  . atenolol (TENORMIN) 25 MG tablet Take 1 tablet (25 mg total) by mouth daily. 30 tablet 11  . cephALEXin  (KEFLEX) 500 MG capsule Take 500 mg by mouth daily.    . chlorpheniramine-HYDROcodone (TUSSIONEX) 10-8 MG/5ML SUER Take 5 mLs by mouth every 12 (twelve) hours as needed for cough. 140 mL 0  . citalopram (CELEXA) 20 MG tablet TAKE ONE TABLET BY MOUTH ONCE DAILY 30 tablet 2  . furosemide (LASIX) 40 MG tablet Take 20 mg by mouth daily.     Marland Kitchen HYDROcodone-acetaminophen (NORCO/VICODIN) 5-325 MG per tablet Take 1 tablet by mouth every 6 (six) hours as needed for moderate pain. 30 tablet 0  . ibrutinib (IMBRUVICA) 140 MG capsul Take 4 capsules (560 mg total) by mouth daily. 120 capsule 9  . isosorbide mononitrate (IMDUR) 30 MG 24 hr tablet Take 3 tablets (90 mg total) by mouth daily. 90 tablet 5  . lactose free nutrition (BOOST PLUS) LIQD Take 237 mLs by mouth 2 (two) times daily as needed (Allow w/pt or pt's wife request after diet advancement). (Patient taking differently: Take 237 mLs by mouth 2 (two) times daily as needed (nutritional supplement). ) 60 Can 0  . methocarbamol (ROBAXIN) 500 MG tablet Take 1 tablet (500 mg total) by mouth every 6 (six) hours as needed for muscle spasms. 50 tablet 0  . nitroGLYCERIN (NITROSTAT) 0.4 MG SL tablet Place 1 tablet (0.4 mg total) under the tongue every 5 (five) minutes as needed for chest pain. 25 tablet prn  . pantoprazole (PROTONIX) 40 MG tablet Take 1 tablet (40 mg total) by mouth 2 (two) times daily. 90 tablet 3  . pravastatin (PRAVACHOL) 80 MG tablet Take 1 tablet (80 mg total) by mouth daily. 30 tablet 11   No current facility-administered medications for this visit.    PHYSICAL EXAMINATION: ECOG PERFORMANCE STATUS: 1 - Symptomatic  but completely ambulatory  Filed Vitals:   01/05/15 1152  BP: 119/63  Pulse: 60  Temp: 97.8 F (36.6 C)  Resp: 17   Filed Weights   01/05/15 1152  Weight: 187 lb 14.4 oz (85.231 kg)    GENERAL:alert, no distress and comfortable SKIN: skin color, texture, turgor are normal, no rashes or significant lesions EYES:  normal, Conjunctiva are pink and non-injected, sclera clear OROPHARYNX:no exudate, no erythema and lips, buccal mucosa, and tongue normal  NECK: supple, thyroid normal size, non-tender, without nodularity LYMPH:  no palpable lymphadenopathy in the cervical, axillary or inguinal LUNGS: clear to auscultation and percussion with normal breathing effort HEART: regular rate & rhythm and no murmurs with bilateral lower extremity edema ABDOMEN:abdomen soft, non-tender and normal bowel sounds Musculoskeletal:no cyanosis of digits and no clubbing  NEURO: alert & oriented x 3 with fluent speech, no focal motor/sensory deficits  LABORATORY DATA:  I have reviewed the data as listed    Component Value Date/Time   NA 141 01/05/2015 1124   NA 142 12/17/2014 0510   NA 142 09/02/2011 0936   K 3.7 01/05/2015 1124   K 3.8 12/17/2014 0510   K 4.1 09/02/2011 0936   CL 99 12/17/2014 0510   CL 106 01/29/2013 0900   CL 98 09/02/2011 0936   CO2 26 01/05/2015 1124   CO2 34* 12/17/2014 0510   CO2 29 09/02/2011 0936   GLUCOSE 116 01/05/2015 1124   GLUCOSE 74 12/17/2014 0510   GLUCOSE 101* 01/29/2013 0900   GLUCOSE 96 09/02/2011 0936   BUN 19.9 01/05/2015 1124   BUN 27* 12/17/2014 0510   BUN 17 09/02/2011 0936   CREATININE 1.4* 01/05/2015 1124   CREATININE 1.56* 12/17/2014 0510   CREATININE 0.8 09/02/2011 0936   CALCIUM 8.5 01/05/2015 1124   CALCIUM 8.5 12/17/2014 0510   CALCIUM 8.5 09/02/2011 0936   PROT 5.4* 01/05/2015 1124   PROT 5.9* 12/15/2014 0442   PROT 7.1 09/02/2011 0936   ALBUMIN 3.1* 01/05/2015 1124   ALBUMIN 3.2* 12/15/2014 0442   AST 12 01/05/2015 1124   AST 17 12/15/2014 0442   AST 21 09/02/2011 0936   ALT 8 01/05/2015 1124   ALT 7 12/15/2014 0442   ALT 17 09/02/2011 0936   ALKPHOS 103 01/05/2015 1124   ALKPHOS 90 12/15/2014 0442   ALKPHOS 72 09/02/2011 0936   BILITOT 0.93 01/05/2015 1124   BILITOT 1.3* 12/15/2014 0442   BILITOT 0.60 09/02/2011 0936   GFRNONAA 40* 12/17/2014  0510   GFRAA 47* 12/17/2014 0510    No results found for: SPEP, UPEP  Lab Results  Component Value Date   WBC 4.6 01/05/2015   NEUTROABS 0.3* 01/05/2015   HGB 10.7* 01/05/2015   HCT 33.6* 01/05/2015   MCV 85.7 01/05/2015   PLT 191 01/05/2015      Chemistry      Component Value Date/Time   NA 141 01/05/2015 1124   NA 142 12/17/2014 0510   NA 142 09/02/2011 0936   K 3.7 01/05/2015 1124   K 3.8 12/17/2014 0510   K 4.1 09/02/2011 0936   CL 99 12/17/2014 0510   CL 106 01/29/2013 0900   CL 98 09/02/2011 0936   CO2 26 01/05/2015 1124   CO2 34* 12/17/2014 0510   CO2 29 09/02/2011 0936   BUN 19.9 01/05/2015 1124   BUN 27* 12/17/2014 0510   BUN 17 09/02/2011 0936   CREATININE 1.4* 01/05/2015 1124   CREATININE 1.56* 12/17/2014 0510   CREATININE  0.8 09/02/2011 0936      Component Value Date/Time   CALCIUM 8.5 01/05/2015 1124   CALCIUM 8.5 12/17/2014 0510   CALCIUM 8.5 09/02/2011 0936   ALKPHOS 103 01/05/2015 1124   ALKPHOS 90 12/15/2014 0442   ALKPHOS 72 09/02/2011 0936   AST 12 01/05/2015 1124   AST 17 12/15/2014 0442   AST 21 09/02/2011 0936   ALT 8 01/05/2015 1124   ALT 7 12/15/2014 0442   ALT 17 09/02/2011 0936   BILITOT 0.93 01/05/2015 1124   BILITOT 1.3* 12/15/2014 0442   BILITOT 0.60 09/02/2011 0936      ASSESSMENT & PLAN:  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 The patient denies recent history of bleeding such as epistaxis, hematuria or hematochezia. He is asymptomatic from the anemia. We  will observe for now.    Chronic kidney disease (CKD), stage II (mild) His renal failure is stable. He follows with nephrologist closely.    Bilateral leg edema  This is likely multifactorial from a reduce mobility, fluid retention, low albumin and chronic kidney disease. Overall, it is stable.   Neutropenia, drug-induced This is likely due to recent treatment. The patient denies recent history of fevers, cough, chills, diarrhea or dysuria. He is asymptomatic from the neutropenia. I will observe for now.  I will hold his treatment for 4 days, restart next week at slightly lower dose    Orders Placed This Encounter  Procedures  . NM PET Image Restag (PS) Skull Base To Thigh    Standing Status: Future     Number of Occurrences:      Standing Expiration Date: 03/06/2016    Order Specific Question:  Reason for Exam (SYMPTOM  OR DIAGNOSIS REQUIRED)    Answer:  staging lymphoma    Order Specific Question:  Preferred imaging location?    Answer:  [facility]   All questions were answered. The patient knows to call the clinic with any problems, questions or concerns. No barriers to learning was detected. I spent 30 minutes counseling the patient face to face. The total time spent in the appointment was 40 minutes and more than 50% was on counseling and review of test results     [facility], [place], MD 01/06/2015 7:50 PM

## 2015-01-06 NOTE — Assessment & Plan Note (Addendum)
He tolerated treatment well except now for neutropenia Prior imaging studies indicated good response to therapy apart from non-specific changes suggestive of infections I recommend holding therapy for 4 days and restart next week at lower dose of 3 tablets daily I plan to repeat imaging study at the end of the month to restage his disease

## 2015-01-06 NOTE — Assessment & Plan Note (Signed)
His renal failure is stable. He follows with nephrologist closely.

## 2015-01-06 NOTE — Assessment & Plan Note (Signed)
This is likely due to recent treatment. The patient denies recent history of fevers, cough, chills, diarrhea or dysuria. He is asymptomatic from the neutropenia. I will observe for now.  I will hold his treatment for 4 days, restart next week at slightly lower dose

## 2015-01-06 NOTE — Assessment & Plan Note (Signed)
This is likely anemia of chronic disease, recent GI bleed and chronic kidney disease. The patient denies recent history of bleeding such as epistaxis, hematuria or hematochezia. He is asymptomatic from the anemia. We will observe for now.

## 2015-01-06 NOTE — Assessment & Plan Note (Signed)
This is likely multifactorial from a reduce mobility, fluid retention, low albumin and chronic kidney disease. Overall, it is stable.

## 2015-01-20 ENCOUNTER — Ambulatory Visit (HOSPITAL_COMMUNITY)
Admission: RE | Admit: 2015-01-20 | Discharge: 2015-01-20 | Disposition: A | Discharge status: Home / Self Care | Payer: Medicare Other | Source: Ambulatory Visit | Attending: Hematology and Oncology | Admitting: Hematology and Oncology

## 2015-01-20 ENCOUNTER — Other Ambulatory Visit (HOSPITAL_BASED_OUTPATIENT_CLINIC_OR_DEPARTMENT_OTHER): Payer: Medicare Other

## 2015-01-20 DIAGNOSIS — R918 Other nonspecific abnormal finding of lung field: Secondary | ICD-10-CM | POA: Diagnosis not present

## 2015-01-20 DIAGNOSIS — I517 Cardiomegaly: Secondary | ICD-10-CM | POA: Insufficient documentation

## 2015-01-20 DIAGNOSIS — K4020 Bilateral inguinal hernia, without obstruction or gangrene, not specified as recurrent: Secondary | ICD-10-CM | POA: Diagnosis not present

## 2015-01-20 DIAGNOSIS — D630 Anemia in neoplastic disease: Secondary | ICD-10-CM

## 2015-01-20 DIAGNOSIS — K449 Diaphragmatic hernia without obstruction or gangrene: Secondary | ICD-10-CM | POA: Insufficient documentation

## 2015-01-20 DIAGNOSIS — C8313 Mantle cell lymphoma, intra-abdominal lymph nodes: Secondary | ICD-10-CM | POA: Diagnosis not present

## 2015-01-20 DIAGNOSIS — Z96642 Presence of left artificial hip joint: Secondary | ICD-10-CM | POA: Diagnosis not present

## 2015-01-20 DIAGNOSIS — Z951 Presence of aortocoronary bypass graft: Secondary | ICD-10-CM | POA: Diagnosis not present

## 2015-01-20 DIAGNOSIS — I2510 Atherosclerotic heart disease of native coronary artery without angina pectoris: Secondary | ICD-10-CM | POA: Diagnosis not present

## 2015-01-20 LAB — GLUCOSE, CAPILLARY: Glucose-Capillary: 86 mg/dL (ref 65–99)

## 2015-01-20 LAB — COMPREHENSIVE METABOLIC PANEL (CC13)
ALK PHOS: 108 U/L (ref 40–150)
ALT: 6 U/L (ref 0–55)
AST: 13 U/L (ref 5–34)
Albumin: 3.1 g/dL — ABNORMAL LOW (ref 3.5–5.0)
Anion Gap: 9 mEq/L (ref 3–11)
BUN: 18.5 mg/dL (ref 7.0–26.0)
CALCIUM: 8.4 mg/dL (ref 8.4–10.4)
CHLORIDE: 106 meq/L (ref 98–109)
CO2: 27 mEq/L (ref 22–29)
Creatinine: 1.4 mg/dL — ABNORMAL HIGH (ref 0.7–1.3)
EGFR: 46 mL/min/{1.73_m2} — AB (ref >90)
GLUCOSE: 89 mg/dL (ref 70–140)
POTASSIUM: 4 meq/L (ref 3.5–5.1)
SODIUM: 142 meq/L (ref 136–145)
Total Bilirubin: 0.79 mg/dL (ref 0.20–1.20)
Total Protein: 5.5 g/dL — ABNORMAL LOW (ref 6.4–8.3)

## 2015-01-20 LAB — CBC WITH DIFFERENTIAL/PLATELET
BASO%: 1.2 % (ref 0.0–2.0)
Basophils Absolute: 0.1 10*3/uL (ref 0.0–0.1)
EOS%: 3.9 % (ref 0.0–7.0)
Eosinophils Absolute: 0.3 10*3/uL (ref 0.0–0.5)
HCT: 34.7 % — ABNORMAL LOW (ref 38.4–49.9)
HGB: 11.1 g/dL — ABNORMAL LOW (ref 13.0–17.1)
LYMPH#: 2.1 10*3/uL (ref 0.9–3.3)
LYMPH%: 31.8 % (ref 14.0–49.0)
MCH: 27.4 pg (ref 27.2–33.4)
MCHC: 31.9 g/dL — AB (ref 32.0–36.0)
MCV: 85.7 fL (ref 79.3–98.0)
MONO#: 0.8 10*3/uL (ref 0.1–0.9)
MONO%: 12.1 % (ref 0.0–14.0)
NEUT#: 3.4 10*3/uL (ref 1.5–6.5)
NEUT%: 51 % (ref 39.0–75.0)
Platelets: 156 10*3/uL (ref 140–400)
RBC: 4.04 10*6/uL — ABNORMAL LOW (ref 4.20–5.82)
RDW: 17.4 % — ABNORMAL HIGH (ref 11.0–14.6)
WBC: 6.7 10*3/uL (ref 4.0–10.3)

## 2015-01-20 LAB — HOLD TUBE, BLOOD BANK

## 2015-01-20 MED ORDER — FLUDEOXYGLUCOSE F - 18 (FDG) INJECTION
9.3000 | Freq: Once | INTRAVENOUS | Status: AC | PRN
  Administered 2015-01-20: 9.3 via INTRAVENOUS

## 2015-01-23 ENCOUNTER — Other Ambulatory Visit: Payer: Self-pay | Admitting: Cardiology

## 2015-01-24 ENCOUNTER — Ambulatory Visit (HOSPITAL_BASED_OUTPATIENT_CLINIC_OR_DEPARTMENT_OTHER): Payer: Medicare Other | Admitting: Hematology and Oncology

## 2015-01-24 ENCOUNTER — Encounter: Payer: Self-pay | Admitting: Hematology and Oncology

## 2015-01-24 VITALS — BP 120/60 | HR 58 | Temp 97.6°F | Resp 18 | Ht 69.0 in | Wt 190.9 lb

## 2015-01-24 DIAGNOSIS — C8313 Mantle cell lymphoma, intra-abdominal lymph nodes: Secondary | ICD-10-CM | POA: Diagnosis not present

## 2015-01-24 DIAGNOSIS — N182 Chronic kidney disease, stage 2 (mild): Secondary | ICD-10-CM

## 2015-01-24 DIAGNOSIS — R600 Localized edema: Secondary | ICD-10-CM | POA: Diagnosis not present

## 2015-01-24 DIAGNOSIS — D630 Anemia in neoplastic disease: Secondary | ICD-10-CM

## 2015-01-24 MED ORDER — CITALOPRAM HYDROBROMIDE 20 MG PO TABS: ORAL_TABLET | ORAL | Status: AC

## 2015-01-24 NOTE — Progress Notes (Signed)
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 Summary of his history is as follows: Martin Morris diagnosed with Mantle cell lymphoma diagnosed in March 2008 with positive bone marrow initially on 12/03/2006 and then negative bone marrow after treatment in October 2009. The patient presented with obstructive liver abnormalities requiring ERCP and non metal stent placement by Dr. Erskine Emery. The stent Morris subsequently removed in October 2008.  The patient Morris treated with 8 cycles of Rituxan, Cytoxan, vincristine and Decadron in combination with Neulasta from 12/19/2006 through 05/15/2007. The patient then received maintenance Rituxan from July 16, 2007 through February 01, 2010. While on Rituxan, Martin had a recurrence of his disease by CT scan carried out on 01/24/2010. Martin Morris then received treatment with bendamustine and Rituxan in combination with Neulasta for 6 cycles from 02/19/2010 through 07/26/2010. PET scan from 08/22/2010 and CT scans of chest, abdomen, and pelvis from 01/01/2011 that showed no evidence of disease. Martin had been off treatment since December 2011 and had been doing well without any symptoms until the CT scan of the abdomen and pelvis on 07/01/2011 showed signs of recurrence. CT scans of abdomen and pelvis with IVC on 09/02/11 showed further progression. Rituxan, subcutaneous Velcade, intravenous Cytoxan and Decadron were initiated on 10/15/2011. CT scans of the  abdomen and pelvis carried out on 01/08/2012 showed a near complete response to therapy. CT scan of the abdomen and pelvis with IV contrast carried out on 06/19/2012 showed stable plaque-like soft tissue density in the upper abdominal retroperitoneum compared with the CT scan of 01/08/2012. CT scan of the abdomen and pelvis with IV contrast carried out on 11/30/2012 showed no evidence of recurrent lymphoma.  As of 08/05/2013, treatment program will be as follows:  -Velcade 2.75 mg subcu.  -Cytoxan 400 mg IV.  -Decadron 20 mg IV.  The above drugs will be given every 3 weeks.  -Rituxan 800 mg IV every 6 weeks.  Previously Velcade, Cytoxan, and Decadron were being administered every 2 weeks, and Rituxan Morris being administered every 4 weeks. (from 12/04/2012 - 08/05/2013). Prior to that  beginning on 10/15/2011 - 12/04/2012, Velcade, Cytoxan, and Decadron were being administered weekly, and Rituxan Morris being administered every 2 weeks. In August of 2014, Martin Morris scheduled  for chemotherapy but had chest pain and it Morris held. Martin underwent a cardiac cath which revealed two of three native vessel ischemic disease on 04/13/2013 but Martin Morris a poor surgical candidate for a re-do CABG. His therapy Morris re-started in October 2014.   Overall Martin's tolerated his treatment without difficulty.  In April Martin had an accidental fall resulting in a left hip fracture.  Martin underwent L THR on 12/09/2013 by Dr. Paralee Cancel.  Martin Morris discharged to rehab.  Prior to his fall, Martin had restaging scans including CT C/A/P on 04/09 which demonstrated absence of recurrent lymphoma in the chest, abdomen or pelvis.  Repeat PET/CT scan on 07/27/2014 show significant disease progression. Rituximab Morris discontinued  The patient Morris admitted to  the hospital from 08/02/2014 to 08/12/2014 due to significant GI bleed from malignant tumor in his stomach that Morris cauterized , biopsy proven to be recurrence of lymphoma.  Martin Morris in the intensive care unit  due to hemorrhagic shock requiring significant blood transfusion. The patient also developed mild acute encephalopathy with minor neurological deficit which subsequently resolved. Between 08/28/2014 to 09/02/2014, Martin Morris admitted to the hospital with acute on chronic heart failure, chronic kidney disease and Escherichia coli sepsis On 09/14/2014, Martin Morris started on Ibrutinib.  INTERVAL HISTORY: Please see below for problem oriented charting. Martin feels well. Denies recent infection or cough. Martin continues to have chronic bilateral lower extremity edema with reduced mobility. His appetite is stable.  REVIEW OF SYSTEMS:   Constitutional: Denies fevers, chills or abnormal weight loss Eyes: Denies blurriness of vision Ears, nose, mouth, throat, and face: Denies mucositis or sore throat Respiratory: Denies cough, dyspnea or wheezes Cardiovascular: Denies palpitation, chest discomfort Gastrointestinal:  Denies nausea, heartburn or change in bowel habits Skin: Denies abnormal skin rashes Lymphatics: Denies new lymphadenopathy or easy bruising Neurological:Denies numbness, tingling or new weaknesses Behavioral/Psych: Mood is stable, no new changes  All other systems were reviewed with the patient and are negative.  I have reviewed the past medical history, past surgical history, social history and family history with the patient and they are unchanged from previous note.  ALLERGIES:  is allergic to atorvastatin.  MEDICATIONS:  Current Outpatient Prescriptions  Medication Sig Dispense Refill  . amLODipine (NORVASC) 5 MG tablet Take 1 tablet (5 mg total) by mouth daily. 30 tablet 11  . atenolol (TENORMIN) 25 MG tablet Take 1 tablet (25 mg total) by mouth daily. 30 tablet 11  . cephALEXin (KEFLEX) 500 MG capsule Take 500 mg by mouth daily.    . chlorpheniramine-HYDROcodone (TUSSIONEX) 10-8 MG/5ML SUER Take 5 mLs by mouth every 12 (twelve) hours as needed for cough. 140 mL 0  . citalopram (CELEXA)  20 MG tablet TAKE ONE TABLET BY MOUTH ONCE DAILY 90 tablet 2  . furosemide (LASIX) 40 MG tablet Take 20 mg by mouth daily.     Marland Kitchen HYDROcodone-acetaminophen (NORCO/VICODIN) 5-325 MG per tablet Take 1 tablet by mouth every 6 (six) hours as needed for moderate pain. 30 tablet 0  . IMBRUVICA 140 MG capsul Take 420 mg by mouth daily.    . isosorbide mononitrate (IMDUR) 30 MG 24 hr tablet Take 3 tablets (90 mg total) by mouth daily. 90 tablet 5  . lactose free nutrition (BOOST PLUS) LIQD Take 237 mLs by mouth 2 (two) times daily as needed (Allow w/pt or pt's wife request after diet advancement). (Patient taking differently: Take 237 mLs by mouth 2 (two) times daily as needed (nutritional supplement). ) 60 Can 0  . methocarbamol (ROBAXIN) 500 MG tablet Take 1 tablet (500 mg total) by mouth every 6 (six) hours as needed for muscle spasms. 50 tablet 0  . nitroGLYCERIN (NITROSTAT) 0.4 MG SL tablet Place 1 tablet (0.4 mg total) under the tongue every 5 (five) minutes as needed for chest pain. 25 tablet prn  . pantoprazole (PROTONIX) 40 MG tablet Take 1 tablet (40 mg total) by mouth 2 (two) times daily. 90 tablet 3  . pravastatin (PRAVACHOL) 80 MG tablet Take 1 tablet (80 mg total) by mouth daily. 30 tablet 11   No current facility-administered medications for this visit.    PHYSICAL EXAMINATION: ECOG PERFORMANCE STATUS: 1 - Symptomatic but completely ambulatory  Filed Vitals:   01/24/15  1214  BP: 120/60  Pulse: 58  Temp: 97.6 F (36.4 C)  Resp: 18   Filed Weights   01/24/15 1214  Weight: 190 lb 14.4 oz (86.592 kg)    GENERAL:alert, no distress and comfortable SKIN: skin color, texture, turgor are normal, no rashes or significant lesions EYES: normal, Conjunctiva are pink and non-injected, sclera clear HEART: regular rate & rhythm and no murmurs with moderate bilateral lower extremity edema ABDOMEN:abdomen soft, non-tender and normal bowel sounds Musculoskeletal:no cyanosis of digits and no  clubbing  NEURO: alert & oriented x 3 with fluent speech, no focal motor/sensory deficits  LABORATORY DATA:  I have reviewed the data as listed    Component Value Date/Time   NA 142 01/20/2015 0908   NA 142 12/17/2014 0510   NA 142 09/02/2011 0936   K 4.0 01/20/2015 0908   K 3.8 12/17/2014 0510   K 4.1 09/02/2011 0936   CL 99 12/17/2014 0510   CL 106 01/29/2013 0900   CL 98 09/02/2011 0936   CO2 27 01/20/2015 0908   CO2 34* 12/17/2014 0510   CO2 29 09/02/2011 0936   GLUCOSE 89 01/20/2015 0908   GLUCOSE 74 12/17/2014 0510   GLUCOSE 101* 01/29/2013 0900   GLUCOSE 96 09/02/2011 0936   BUN 18.5 01/20/2015 0908   BUN 27* 12/17/2014 0510   BUN 17 09/02/2011 0936   CREATININE 1.4* 01/20/2015 0908   CREATININE 1.56* 12/17/2014 0510   CREATININE 0.8 09/02/2011 0936   CALCIUM 8.4 01/20/2015 0908   CALCIUM 8.5 12/17/2014 0510   CALCIUM 8.5 09/02/2011 0936   PROT 5.5* 01/20/2015 0908   PROT 5.9* 12/15/2014 0442   PROT 7.1 09/02/2011 0936   ALBUMIN 3.1* 01/20/2015 0908   ALBUMIN 3.2* 12/15/2014 0442   AST 13 01/20/2015 0908   AST 17 12/15/2014 0442   AST 21 09/02/2011 0936   ALT 6 01/20/2015 0908   ALT 7 12/15/2014 0442   ALT 17 09/02/2011 0936   ALKPHOS 108 01/20/2015 0908   ALKPHOS 90 12/15/2014 0442   ALKPHOS 72 09/02/2011 0936   BILITOT 0.79 01/20/2015 0908   BILITOT 1.3* 12/15/2014 0442   BILITOT 0.60 09/02/2011 0936   GFRNONAA 40* 12/17/2014 0510   GFRAA 47* 12/17/2014 0510    No results found for: SPEP, UPEP  Lab Results  Component Value Date   WBC 6.7 01/20/2015   NEUTROABS 3.4 01/20/2015   HGB 11.1* 01/20/2015   HCT 34.7* 01/20/2015   MCV 85.7 01/20/2015   PLT 156 01/20/2015      Chemistry      Component Value Date/Time   NA 142 01/20/2015 0908   NA 142 12/17/2014 0510   NA 142 09/02/2011 0936   K 4.0 01/20/2015 0908   K 3.8 12/17/2014 0510   K 4.1 09/02/2011 0936   CL 99 12/17/2014 0510   CL 106 01/29/2013 0900   CL 98 09/02/2011 0936   CO2 27  01/20/2015 0908   CO2 34* 12/17/2014 0510   CO2 29 09/02/2011 0936   BUN 18.5 01/20/2015 0908   BUN 27* 12/17/2014 0510   BUN 17 09/02/2011 0936   CREATININE 1.4* 01/20/2015 0908   CREATININE 1.56* 12/17/2014 0510   CREATININE 0.8 09/02/2011 0936      Component Value Date/Time   CALCIUM 8.4 01/20/2015 0908   CALCIUM 8.5 12/17/2014 0510   CALCIUM 8.5 09/02/2011 0936   ALKPHOS 108 01/20/2015 0908   ALKPHOS 90 12/15/2014 0442   ALKPHOS 72 09/02/2011 0936  AST 13 01/20/2015 0908   AST 17 12/15/2014 0442   AST 21 09/02/2011 0936   ALT 6 01/20/2015 0908   ALT 7 12/15/2014 0442   ALT 17 09/02/2011 0936   BILITOT 0.79 01/20/2015 0908   BILITOT 1.3* 12/15/2014 0442   BILITOT 0.60 09/02/2011 0936       RADIOGRAPHIC STUDIES: I reviewed imaging study with him and his wife I have personally reviewed the radiological images as listed and agreed with the findings in the report.   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 I recommend we continue the same. I will see him back in 3 months we'll repeat history, physical examination and blood work and repeat imaging study in 6 months.    Anemia in neoplastic disease This is likely anemia of chronic disease, recent GI bleed and chronic kidney disease. The patient denies recent history of bleeding such as epistaxis, hematuria or hematochezia. Martin is asymptomatic from the anemia. We will observe for now.      Bilateral leg edema  This is likely multifactorial from a reduce mobility, fluid retention, low albumin and chronic kidney disease. Overall, it is stable.     Chronic kidney disease (CKD), stage II (mild) His renal failure is stable. Martin follows with nephrologist closely.     No orders of the defined types were placed in this encounter.   All questions were answered. The patient knows to call the clinic with any  problems, questions or concerns. No barriers to learning Morris detected. I spent 25 minutes counseling the patient face to face. The total time spent in the appointment Morris 30 minutes and more than 50% Morris on counseling and review of test results     [facility], [place], MD 01/24/2015 2:51 PM

## 2015-01-24 NOTE — Assessment & Plan Note (Signed)
This is likely anemia of chronic disease, recent GI bleed and chronic kidney disease. The patient denies recent history of bleeding such as epistaxis, hematuria or hematochezia. He is asymptomatic from the anemia. We will observe for now.

## 2015-01-24 NOTE — Assessment & Plan Note (Signed)
This is likely multifactorial from a reduce mobility, fluid retention, low albumin and chronic kidney disease. Overall, it is stable.

## 2015-01-24 NOTE — Assessment & Plan Note (Signed)
He tolerated treatment well PET/CT scan show complete response to treatment. I recommend we continue the same. I will see him back in 3 months we'll repeat history, physical examination and blood work and repeat imaging study in 6 months.

## 2015-01-24 NOTE — Assessment & Plan Note (Signed)
His renal failure is stable. He follows with nephrologist closely.

## 2015-01-25 ENCOUNTER — Telehealth: Payer: Self-pay | Admitting: Hematology and Oncology

## 2015-01-25 NOTE — Telephone Encounter (Signed)
s.w. pt wife and advised on Aug appt.....pt ok and aware °

## 2015-02-03 ENCOUNTER — Other Ambulatory Visit: Payer: Self-pay | Admitting: Cardiology

## 2015-02-23 ENCOUNTER — Other Ambulatory Visit: Payer: Self-pay

## 2015-02-23 MED ORDER — FUROSEMIDE 40 MG PO TABS: 20.0000 mg | ORAL_TABLET | Freq: Every day | ORAL | Status: AC

## 2015-03-08 ENCOUNTER — Other Ambulatory Visit: Payer: Self-pay | Admitting: *Deleted

## 2015-03-08 ENCOUNTER — Encounter: Payer: Self-pay | Admitting: Hematology and Oncology

## 2015-03-08 MED ORDER — PRAVASTATIN SODIUM 80 MG PO TABS
80.0000 mg | ORAL_TABLET | Freq: Every day | ORAL | Status: AC
Start: 2015-03-08 — End: ?

## 2015-03-08 NOTE — Progress Notes (Signed)
Received renewed approval letter from [employer]. Pt is approved for Rituxan, Velcade and Imbruvica from 11/25/14 to 11/24/15 or when the benefit cap has been met.  The amount of the grant is $12,500.

## 2015-04-25 ENCOUNTER — Ambulatory Visit (HOSPITAL_BASED_OUTPATIENT_CLINIC_OR_DEPARTMENT_OTHER): Payer: Medicare Other

## 2015-04-25 ENCOUNTER — Telehealth: Payer: Self-pay | Admitting: Hematology and Oncology

## 2015-04-25 ENCOUNTER — Encounter: Payer: Self-pay | Admitting: Hematology and Oncology

## 2015-04-25 ENCOUNTER — Ambulatory Visit (HOSPITAL_BASED_OUTPATIENT_CLINIC_OR_DEPARTMENT_OTHER): Payer: Medicare Other | Admitting: Hematology and Oncology

## 2015-04-25 ENCOUNTER — Other Ambulatory Visit (HOSPITAL_BASED_OUTPATIENT_CLINIC_OR_DEPARTMENT_OTHER): Payer: Medicare Other

## 2015-04-25 VITALS — BP 112/54 | HR 56 | Temp 98.1°F | Resp 18 | Ht 69.0 in | Wt 192.8 lb

## 2015-04-25 DIAGNOSIS — C8313 Mantle cell lymphoma, intra-abdominal lymph nodes: Secondary | ICD-10-CM

## 2015-04-25 DIAGNOSIS — Z95828 Presence of other vascular implants and grafts: Secondary | ICD-10-CM

## 2015-04-25 DIAGNOSIS — D702 Other drug-induced agranulocytosis: Secondary | ICD-10-CM

## 2015-04-25 DIAGNOSIS — D630 Anemia in neoplastic disease: Secondary | ICD-10-CM | POA: Diagnosis not present

## 2015-04-25 DIAGNOSIS — R600 Localized edema: Secondary | ICD-10-CM

## 2015-04-25 DIAGNOSIS — D701 Agranulocytosis secondary to cancer chemotherapy: Secondary | ICD-10-CM | POA: Diagnosis not present

## 2015-04-25 DIAGNOSIS — N182 Chronic kidney disease, stage 2 (mild): Secondary | ICD-10-CM

## 2015-04-25 LAB — CBC WITH DIFFERENTIAL/PLATELET
BASO%: 1.1 % (ref 0.0–2.0)
BASOS ABS: 0 10*3/uL (ref 0.0–0.1)
EOS%: 5.4 % (ref 0.0–7.0)
Eosinophils Absolute: 0.2 10*3/uL (ref 0.0–0.5)
HCT: 35.9 % — ABNORMAL LOW (ref 38.4–49.9)
HEMOGLOBIN: 11.8 g/dL — AB (ref 13.0–17.1)
LYMPH#: 1.5 10*3/uL (ref 0.9–3.3)
LYMPH%: 41.7 % (ref 14.0–49.0)
MCH: 29.3 pg (ref 27.2–33.4)
MCHC: 32.9 g/dL (ref 32.0–36.0)
MCV: 89.1 fL (ref 79.3–98.0)
MONO#: 0.7 10*3/uL (ref 0.1–0.9)
MONO%: 19.4 % — AB (ref 0.0–14.0)
NEUT%: 32.4 % — ABNORMAL LOW (ref 39.0–75.0)
NEUTROS ABS: 1.1 10*3/uL — AB (ref 1.5–6.5)
NRBC: 0 % (ref 0–0)
Platelets: 148 10*3/uL (ref 140–400)
RBC: 4.03 10*6/uL — AB (ref 4.20–5.82)
RDW: 16.5 % — AB (ref 11.0–14.6)
WBC: 3.5 10*3/uL — AB (ref 4.0–10.3)

## 2015-04-25 LAB — COMPREHENSIVE METABOLIC PANEL (CC13)
ALT: 8 U/L (ref 0–55)
AST: 17 U/L (ref 5–34)
Albumin: 3.3 g/dL — ABNORMAL LOW (ref 3.5–5.0)
Alkaline Phosphatase: 83 U/L (ref 40–150)
Anion Gap: 8 mEq/L (ref 3–11)
BUN: 22.6 mg/dL (ref 7.0–26.0)
CO2: 25 mEq/L (ref 22–29)
Calcium: 8.8 mg/dL (ref 8.4–10.4)
Chloride: 109 mEq/L (ref 98–109)
Creatinine: 1.5 mg/dL — ABNORMAL HIGH (ref 0.7–1.3)
EGFR: 44 mL/min/{1.73_m2} — AB (ref >90)
Glucose: 107 mg/dl (ref 70–140)
Potassium: 3.7 mEq/L (ref 3.5–5.1)
Sodium: 142 mEq/L (ref 136–145)
TOTAL PROTEIN: 5.8 g/dL — AB (ref 6.4–8.3)
Total Bilirubin: 0.78 mg/dL (ref 0.20–1.20)

## 2015-04-25 LAB — HOLD TUBE, BLOOD BANK

## 2015-04-25 MED ORDER — SODIUM CHLORIDE 0.9 % IJ SOLN
10.0000 mL | INTRAMUSCULAR | Status: DC | PRN
  Administered 2015-04-25: 10 mL via INTRAVENOUS
  Filled 2015-04-25: qty 10

## 2015-04-25 MED ORDER — HEPARIN SOD (PORK) LOCK FLUSH 100 UNIT/ML IV SOLN
500.0000 [IU] | Freq: Once | INTRAVENOUS | Status: AC
  Administered 2015-04-25: 500 [IU] via INTRAVENOUS
  Filled 2015-04-25: qty 5

## 2015-04-25 NOTE — Assessment & Plan Note (Signed)
This is likely due to recent treatment. The patient denies recent history of fevers, cough, chills, diarrhea or dysuria. He is asymptomatic from the neutropenia. I will observe for now.

## 2015-04-25 NOTE — Assessment & Plan Note (Signed)
He tolerated treatment well PET/CT scan in May showed near complete response to treatment. I recommend we continue the same. I will see him back in 3 months with repeat history, physical examination and blood work and repeat imaging study in 3 months.

## 2015-04-25 NOTE — Assessment & Plan Note (Signed)
His renal failure is stable. He follows with nephrologist closely.  

## 2015-04-25 NOTE — Assessment & Plan Note (Signed)
I recommend discontinuation of amlodipine. I recommend his wife to give him 20 mg Lasix daily until he reached target weight at around 188 pounds, which is 5 pounds less from today's weight

## 2015-04-25 NOTE — Patient Instructions (Signed)

## 2015-04-25 NOTE — Assessment & Plan Note (Signed)
This is likely anemia of chronic disease, recent GI bleed and chronic kidney disease. The patient denies recent history of bleeding such as epistaxis, hematuria or hematochezia. He is asymptomatic from the anemia. We will observe for now.    

## 2015-04-25 NOTE — Telephone Encounter (Signed)
Gave and printed appt sched and avs fo rpt for OCT and NOV  °

## 2015-04-25 NOTE — Progress Notes (Signed)
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 He returns for further follow-up. There were no new lymphadenopathy. No recent infection. Appetite is stable. Has mild bruising. Denies diarrhea. The patient denies any recent signs or symptoms of bleeding such as spontaneous epistaxis, hematuria or hematochezia. He has chronic bilateral lower extremity edema.  REVIEW OF SYSTEMS:   Constitutional: Denies fevers, chills or abnormal weight loss Eyes: Denies blurriness of vision Ears, nose, mouth, throat, and face: Denies mucositis or sore throat Respiratory: Denies cough, dyspnea or wheezes Cardiovascular: Denies palpitation, chest discomfort  Gastrointestinal:  Denies nausea, heartburn or change in bowel habits Skin: Denies abnormal skin rashes Lymphatics: Denies new lymphadenopathy  Neurological:Denies numbness, tingling or new weaknesses Behavioral/Psych: Mood is stable, no new changes  All other systems were reviewed with the patient and are negative.  I have reviewed the past medical history, past surgical history, social history and family history with the patient and they are unchanged from previous note.  ALLERGIES:  is allergic to atorvastatin.  MEDICATIONS:  Current Outpatient Prescriptions  Medication Sig Dispense Refill  . amLODipine (NORVASC) 5 MG tablet TAKE ONE TABLET BY MOUTH ONCE DAILY 30 tablet 6  . atenolol (TENORMIN) 25 MG tablet Take 1 tablet (25 mg total) by  mouth daily. 30 tablet 11  . cephALEXin (KEFLEX) 500 MG capsule Take 500 mg by mouth daily.    . chlorpheniramine-HYDROcodone (TUSSIONEX) 10-8 MG/5ML SUER Take 5 mLs by mouth every 12 (twelve) hours as needed for cough. 140 mL 0  . citalopram (CELEXA) 20 MG tablet TAKE ONE TABLET BY MOUTH ONCE DAILY 90 tablet 2  . furosemide (LASIX) 40 MG tablet Take 0.5 tablets (20 mg total) by mouth daily. 30 tablet 7  . HYDROcodone-acetaminophen (NORCO/VICODIN) 5-325 MG per tablet Take 1 tablet by mouth every 6 (six) hours as needed for moderate pain. 30 tablet 0  . IMBRUVICA 140 MG capsul Take 420 mg by mouth daily.    . isosorbide mononitrate (IMDUR) 30 MG 24 hr tablet Take 3 tablets (90 mg total) by mouth daily. 90 tablet 5  . lactose free nutrition (BOOST PLUS) LIQD Take 237 mLs by mouth 2 (two) times daily as needed (Allow w/pt or pt's wife request after diet advancement). (Patient taking differently: Take 237 mLs by mouth 2 (two) times daily as needed (nutritional supplement). ) 60 Can 0  . methocarbamol (ROBAXIN) 500 MG tablet Take 1 tablet (500 mg total) by mouth every 6 (six) hours as needed for muscle spasms. 50 tablet 0  . nitroGLYCERIN (NITROSTAT) 0.4 MG SL tablet Place 1 tablet (0.4 mg total) under the tongue every 5 (five) minutes as needed for chest pain. 25 tablet prn  . pantoprazole (PROTONIX) 40 MG tablet Take 1 tablet (40 mg total) by mouth 2 (two) times daily. 90 tablet 3  . pravastatin (PRAVACHOL) 80 MG tablet Take 1 tablet (80 mg total) by mouth daily. 90 tablet 1   No current facility-administered medications for this visit.    PHYSICAL EXAMINATION: ECOG PERFORMANCE STATUS: 2 - Symptomatic, <  50% confined to bed  Filed Vitals:   04/25/15 1018  BP: 112/54  Pulse: 56  Temp: 98.1 F (36.7 C)  Resp: 18   Filed Weights   04/25/15 1018  Weight: 192 lb 12.8 oz (87.454 kg)    GENERAL:alert, no distress and comfortable SKIN: skin color, texture, turgor are normal, no rashes or  significant lesions. Mild skin bruises are noted EYES: normal, Conjunctiva are pink and non-injected, sclera clear OROPHARYNX:no exudate, no erythema and lips, buccal mucosa, and tongue normal  NECK: supple, thyroid normal size, non-tender, without nodularity LYMPH:  no palpable lymphadenopathy in the cervical, axillary or inguinal LUNGS: clear to auscultation and percussion with normal breathing effort HEART: regular rate & rhythm and no murmurs with moderate bilateral lower extremity edema ABDOMEN:abdomen soft, non-tender and normal bowel sounds Musculoskeletal:no cyanosis of digits and no clubbing  NEURO: alert & oriented x 3 with fluent speech, no focal motor/sensory deficits  LABORATORY DATA:  I have reviewed the data as listed    Component Value Date/Time   NA 142 01/20/2015 0908   NA 142 12/17/2014 0510   NA 142 09/02/2011 0936   K 4.0 01/20/2015 0908   K 3.8 12/17/2014 0510   K 4.1 09/02/2011 0936   CL 99 12/17/2014 0510   CL 106 01/29/2013 0900   CL 98 09/02/2011 0936   CO2 27 01/20/2015 0908   CO2 34* 12/17/2014 0510   CO2 29 09/02/2011 0936   GLUCOSE 89 01/20/2015 0908   GLUCOSE 74 12/17/2014 0510   GLUCOSE 101* 01/29/2013 0900   GLUCOSE 96 09/02/2011 0936   BUN 18.5 01/20/2015 0908   BUN 27* 12/17/2014 0510   BUN 17 09/02/2011 0936   CREATININE 1.4* 01/20/2015 0908   CREATININE 1.56* 12/17/2014 0510   CREATININE 0.8 09/02/2011 0936   CALCIUM 8.4 01/20/2015 0908   CALCIUM 8.5 12/17/2014 0510   CALCIUM 8.5 09/02/2011 0936   PROT 5.5* 01/20/2015 0908   PROT 5.9* 12/15/2014 0442   PROT 7.1 09/02/2011 0936   ALBUMIN 3.1* 01/20/2015 0908   ALBUMIN 3.2* 12/15/2014 0442   AST 13 01/20/2015 0908   AST 17 12/15/2014 0442   AST 21 09/02/2011 0936   ALT 6 01/20/2015 0908   ALT 7 12/15/2014 0442   ALT 17 09/02/2011 0936   ALKPHOS 108 01/20/2015 0908   ALKPHOS 90 12/15/2014 0442   ALKPHOS 72 09/02/2011 0936   BILITOT 0.79 01/20/2015 0908   BILITOT 1.3* 12/15/2014  0442   BILITOT 0.60 09/02/2011 0936   GFRNONAA 40* 12/17/2014 0510   GFRAA 47* 12/17/2014 0510    No results found for: SPEP, UPEP  Lab Results  Component Value Date   WBC 3.5* 04/25/2015   NEUTROABS 1.1* 04/25/2015   HGB 11.8* 04/25/2015   HCT 35.9* 04/25/2015   MCV 89.1 04/25/2015   PLT 148 04/25/2015      Chemistry      Component Value Date/Time   NA 142 01/20/2015 0908   NA 142 12/17/2014 0510   NA 142 09/02/2011 0936   K 4.0 01/20/2015 0908   K 3.8 12/17/2014 0510   K 4.1 09/02/2011 0936   CL 99 12/17/2014 0510   CL 106 01/29/2013 0900   CL 98 09/02/2011 0936   CO2 27 01/20/2015 0908   CO2 34* 12/17/2014 0510   CO2 29 09/02/2011 0936   BUN 18.5 01/20/2015 0908   BUN 27* 12/17/2014 0510   BUN 17 09/02/2011 0936   CREATININE 1.4* 01/20/2015 2229  CREATININE 1.56* 12/17/2014 0510   CREATININE 0.8 09/02/2011 0936      Component Value Date/Time   CALCIUM 8.4 01/20/2015 0908   CALCIUM 8.5 12/17/2014 0510   CALCIUM 8.5 09/02/2011 0936   ALKPHOS 108 01/20/2015 0908   ALKPHOS 90 12/15/2014 0442   ALKPHOS 72 09/02/2011 0936   AST 13 01/20/2015 0908   AST 17 12/15/2014 0442   AST 21 09/02/2011 0936   ALT 6 01/20/2015 0908   ALT 7 12/15/2014 0442   ALT 17 09/02/2011 0936   BILITOT 0.79 01/20/2015 0908   BILITOT 1.3* 12/15/2014 0442   BILITOT 0.60 09/02/2011 0936      ASSESSMENT & PLAN:  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 I recommend we continue the same. I will see him back in 3 months with repeat history, physical examination and blood work and repeat imaging study in 3 months.     Anemia in neoplastic disease This is likely anemia of chronic disease, recent GI bleed and chronic kidney disease. The patient denies recent history of bleeding such as epistaxis, hematuria or hematochezia. He is asymptomatic from the anemia. We will observe for now.       Neutropenia, drug-induced This is likely due to recent treatment. The patient denies recent history of fevers, cough, chills, diarrhea or dysuria. He is asymptomatic from the neutropenia. I will observe for now.    Bilateral leg edema I recommend discontinuation of amlodipine. I recommend his wife to give him 20 mg Lasix daily until he reached target weight at around 188 pounds, which is 5 pounds less from today's weight  Chronic kidney disease (CKD), stage II (mild) His renal failure is stable. He follows with nephrologist closely.      Orders Placed This Encounter  Procedures  . NM PET Image Restag (PS) Skull Base To Thigh    Standing Status: Future     Number of Occurrences:      Standing Expiration Date: 06/24/2016    Order Specific Question:  Reason for Exam (SYMPTOM  OR DIAGNOSIS REQUIRED)    Answer:  staging lymphoma, assess response to Rx    Order Specific Question:  Preferred imaging location?    Answer:  [facility]   All questions were answered. The patient knows to call the clinic with any problems, questions or concerns. No barriers to learning was detected. I spent 25 minutes counseling the patient face to face. The total time spent in the appointment was 30 minutes and more than 50% was on counseling and review of test results     [facility], [place], MD 04/25/2015 10:48 AM

## 2015-05-15 ENCOUNTER — Telehealth: Payer: Self-pay | Admitting: *Deleted

## 2015-05-15 MED ORDER — CEPHALEXIN 500 MG PO CAPS: 500.0000 mg | ORAL_CAPSULE | Freq: Every day | ORAL | Status: DC

## 2015-05-15 NOTE — Telephone Encounter (Signed)
May refill 30 days only I do not support long term use of antibiotics. He needs another MD to fill his future prescriptions

## 2015-05-15 NOTE — Telephone Encounter (Signed)
Wife notified of below.

## 2015-05-15 NOTE — Telephone Encounter (Signed)
Patient's wife called asking for "refill on Cephalaxin 500 mg.  Reports this was last filled by a hospitalist but Dr. Alvy Bimler knows he takes this for years due to consistent UTI's and infections.  We use [facility] in [place]."

## 2015-06-20 ENCOUNTER — Ambulatory Visit (HOSPITAL_BASED_OUTPATIENT_CLINIC_OR_DEPARTMENT_OTHER): Payer: Medicare Other

## 2015-06-20 DIAGNOSIS — Z95828 Presence of other vascular implants and grafts: Secondary | ICD-10-CM

## 2015-06-20 DIAGNOSIS — C8313 Mantle cell lymphoma, intra-abdominal lymph nodes: Secondary | ICD-10-CM | POA: Diagnosis not present

## 2015-06-20 DIAGNOSIS — Z452 Encounter for adjustment and management of vascular access device: Secondary | ICD-10-CM | POA: Diagnosis not present

## 2015-06-20 MED ORDER — SODIUM CHLORIDE 0.9 % IJ SOLN
10.0000 mL | INTRAMUSCULAR | Status: DC | PRN
  Administered 2015-06-20: 10 mL via INTRAVENOUS
  Filled 2015-06-20: qty 10

## 2015-06-20 MED ORDER — HEPARIN SOD (PORK) LOCK FLUSH 100 UNIT/ML IV SOLN
500.0000 [IU] | Freq: Once | INTRAVENOUS | Status: AC
  Administered 2015-06-20: 500 [IU] via INTRAVENOUS
  Filled 2015-06-20: qty 5

## 2015-06-20 NOTE — Patient Instructions (Signed)

## 2015-06-28 ENCOUNTER — Telehealth: Payer: Self-pay | Admitting: Cardiology

## 2015-06-28 NOTE — Telephone Encounter (Signed)
New message      Calling to let the nurse know she is faxing an ejection fraction request form to you to verify diagnosis and get latest ejection fraction

## 2015-06-30 ENCOUNTER — Telehealth: Payer: Self-pay | Admitting: Cardiology

## 2015-06-30 NOTE — Telephone Encounter (Signed)
LM for Martin Morris w/ dx & last EF. Instructed to call if further needs.

## 2015-06-30 NOTE — Telephone Encounter (Signed)
Martin Morris called in stating that the pt is being evaluated for the CHF program but first she wanted to make sure that he had a diagnosis of heart failure. If he does have a diagnosis of heart failure she will also need the Ejection fraction. Pleas f/u with her  Thanks

## 2015-07-02 ENCOUNTER — Other Ambulatory Visit: Payer: Self-pay | Admitting: Hematology and Oncology

## 2015-07-24 ENCOUNTER — Ambulatory Visit (HOSPITAL_BASED_OUTPATIENT_CLINIC_OR_DEPARTMENT_OTHER): Payer: Medicare Other

## 2015-07-24 ENCOUNTER — Other Ambulatory Visit (HOSPITAL_BASED_OUTPATIENT_CLINIC_OR_DEPARTMENT_OTHER): Payer: Medicare Other

## 2015-07-24 ENCOUNTER — Ambulatory Visit (HOSPITAL_COMMUNITY)
Admission: RE | Admit: 2015-07-24 | Discharge: 2015-07-24 | Disposition: A | Discharge status: Home / Self Care | Payer: Medicare Other | Source: Ambulatory Visit | Attending: Hematology and Oncology | Admitting: Hematology and Oncology

## 2015-07-24 DIAGNOSIS — C8313 Mantle cell lymphoma, intra-abdominal lymph nodes: Secondary | ICD-10-CM | POA: Diagnosis present

## 2015-07-24 DIAGNOSIS — J90 Pleural effusion, not elsewhere classified: Secondary | ICD-10-CM | POA: Diagnosis not present

## 2015-07-24 DIAGNOSIS — Z95828 Presence of other vascular implants and grafts: Secondary | ICD-10-CM

## 2015-07-24 LAB — COMPREHENSIVE METABOLIC PANEL (CC13)
AST: 16 U/L (ref 5–34)
Albumin: 3.2 g/dL — ABNORMAL LOW (ref 3.5–5.0)
Alkaline Phosphatase: 88 U/L (ref 40–150)
Anion Gap: 7 mEq/L (ref 3–11)
BUN: 16.6 mg/dL (ref 7.0–26.0)
CALCIUM: 8.8 mg/dL (ref 8.4–10.4)
CHLORIDE: 107 meq/L (ref 98–109)
CO2: 26 meq/L (ref 22–29)
CREATININE: 1.3 mg/dL (ref 0.7–1.3)
EGFR: 53 mL/min/{1.73_m2} — ABNORMAL LOW (ref >90)
GLUCOSE: 77 mg/dL (ref 70–140)
POTASSIUM: 3.6 meq/L (ref 3.5–5.1)
SODIUM: 141 meq/L (ref 136–145)
Total Bilirubin: 1.08 mg/dL (ref 0.20–1.20)
Total Protein: 6.2 g/dL — ABNORMAL LOW (ref 6.4–8.3)

## 2015-07-24 LAB — CBC WITH DIFFERENTIAL/PLATELET
BASO%: 0.7 % (ref 0.0–2.0)
Basophils Absolute: 0 10*3/uL (ref 0.0–0.1)
EOS%: 6.8 % (ref 0.0–7.0)
Eosinophils Absolute: 0.2 10*3/uL (ref 0.0–0.5)
HCT: 37.2 % — ABNORMAL LOW (ref 38.4–49.9)
HEMOGLOBIN: 12.1 g/dL — AB (ref 13.0–17.1)
LYMPH#: 1.4 10*3/uL (ref 0.9–3.3)
LYMPH%: 48.3 % (ref 14.0–49.0)
MCH: 29.7 pg (ref 27.2–33.4)
MCHC: 32.5 g/dL (ref 32.0–36.0)
MCV: 91.2 fL (ref 79.3–98.0)
MONO#: 0.8 10*3/uL (ref 0.1–0.9)
MONO%: 25.7 % — AB (ref 0.0–14.0)
NEUT#: 0.5 10*3/uL — CL (ref 1.5–6.5)
NEUT%: 18.5 % — AB (ref 39.0–75.0)
Platelets: 160 10*3/uL (ref 140–400)
RBC: 4.08 10*6/uL — ABNORMAL LOW (ref 4.20–5.82)
RDW: 14.7 % — AB (ref 11.0–14.6)
WBC: 2.9 10*3/uL — ABNORMAL LOW (ref 4.0–10.3)
nRBC: 0 % (ref 0–0)

## 2015-07-24 LAB — GLUCOSE, CAPILLARY: GLUCOSE-CAPILLARY: 75 mg/dL (ref 65–99)

## 2015-07-24 MED ORDER — FLUDEOXYGLUCOSE F - 18 (FDG) INJECTION: 10.2600 | Freq: Once | INTRAVENOUS | Status: DC | PRN

## 2015-07-24 MED ORDER — HEPARIN SOD (PORK) LOCK FLUSH 100 UNIT/ML IV SOLN
500.0000 [IU] | Freq: Once | INTRAVENOUS | Status: AC
  Administered 2015-07-24: 500 [IU] via INTRAVENOUS
  Filled 2015-07-24: qty 5

## 2015-07-24 MED ORDER — SODIUM CHLORIDE 0.9 % IJ SOLN
10.0000 mL | INTRAMUSCULAR | Status: DC | PRN
  Administered 2015-07-24: 10 mL via INTRAVENOUS
  Filled 2015-07-24: qty 10

## 2015-07-24 NOTE — Patient Instructions (Signed)

## 2015-07-25 ENCOUNTER — Telehealth: Payer: Self-pay | Admitting: *Deleted

## 2015-07-25 ENCOUNTER — Other Ambulatory Visit: Payer: Medicare Other

## 2015-07-25 ENCOUNTER — Ambulatory Visit: Payer: Medicare Other | Admitting: Hematology and Oncology

## 2015-07-25 NOTE — Telephone Encounter (Signed)
Wife called- pt cannot make it to appt today.  Per Dr Alvy Bimler, reschedule to 12/5 @ 1400, labs @ 1315. Test results "bad"- stop Ibrutinab, will talk about options on Monday. Wife verbalized understanding

## 2015-07-26 ENCOUNTER — Other Ambulatory Visit: Payer: Self-pay | Admitting: Hematology and Oncology

## 2015-07-26 ENCOUNTER — Telehealth: Payer: Self-pay | Admitting: Hematology and Oncology

## 2015-07-26 NOTE — Telephone Encounter (Signed)
s.w. pt and advised on DEC appt....ok adn aware

## 2015-07-31 ENCOUNTER — Telehealth: Payer: Self-pay | Admitting: Hematology and Oncology

## 2015-07-31 ENCOUNTER — Ambulatory Visit (HOSPITAL_BASED_OUTPATIENT_CLINIC_OR_DEPARTMENT_OTHER): Payer: Medicare Other | Admitting: Hematology and Oncology

## 2015-07-31 ENCOUNTER — Other Ambulatory Visit (HOSPITAL_BASED_OUTPATIENT_CLINIC_OR_DEPARTMENT_OTHER): Payer: Medicare Other

## 2015-07-31 ENCOUNTER — Telehealth: Payer: Self-pay | Admitting: *Deleted

## 2015-07-31 ENCOUNTER — Encounter: Payer: Self-pay | Admitting: Hematology and Oncology

## 2015-07-31 VITALS — BP 115/58 | HR 78 | Temp 97.8°F | Resp 18 | Ht 69.0 in | Wt 197.4 lb

## 2015-07-31 DIAGNOSIS — C8313 Mantle cell lymphoma, intra-abdominal lymph nodes: Secondary | ICD-10-CM | POA: Diagnosis not present

## 2015-07-31 DIAGNOSIS — D702 Other drug-induced agranulocytosis: Secondary | ICD-10-CM | POA: Diagnosis not present

## 2015-07-31 DIAGNOSIS — N182 Chronic kidney disease, stage 2 (mild): Secondary | ICD-10-CM | POA: Diagnosis not present

## 2015-07-31 DIAGNOSIS — R600 Localized edema: Secondary | ICD-10-CM | POA: Diagnosis not present

## 2015-07-31 DIAGNOSIS — Z515 Encounter for palliative care: Secondary | ICD-10-CM

## 2015-07-31 DIAGNOSIS — D630 Anemia in neoplastic disease: Secondary | ICD-10-CM

## 2015-07-31 DIAGNOSIS — C8318 Mantle cell lymphoma, lymph nodes of multiple sites: Secondary | ICD-10-CM

## 2015-07-31 LAB — CBC WITH DIFFERENTIAL/PLATELET
BASO%: 0.4 % (ref 0.0–2.0)
Basophils Absolute: 0 10*3/uL (ref 0.0–0.1)
EOS%: 10.2 % — AB (ref 0.0–7.0)
Eosinophils Absolute: 0.2 10*3/uL (ref 0.0–0.5)
HEMATOCRIT: 39.4 % (ref 38.4–49.9)
HEMOGLOBIN: 12.6 g/dL — AB (ref 13.0–17.1)
LYMPH#: 0.8 10*3/uL — AB (ref 0.9–3.3)
LYMPH%: 38.1 % (ref 14.0–49.0)
MCH: 29.5 pg (ref 27.2–33.4)
MCHC: 32 g/dL (ref 32.0–36.0)
MCV: 92.1 fL (ref 79.3–98.0)
MONO#: 0.6 10*3/uL (ref 0.1–0.9)
MONO%: 27.9 % — AB (ref 0.0–14.0)
NEUT#: 0.5 10*3/uL — CL (ref 1.5–6.5)
NEUT%: 23.4 % — AB (ref 39.0–75.0)
Platelets: 174 10*3/uL (ref 140–400)
RBC: 4.27 10*6/uL (ref 4.20–5.82)
RDW: 14.7 % — AB (ref 11.0–14.6)
WBC: 2.1 10*3/uL — ABNORMAL LOW (ref 4.0–10.3)

## 2015-07-31 LAB — COMPREHENSIVE METABOLIC PANEL
ALT: <9 U/L (ref 0–55)
ANION GAP: 9 meq/L (ref 3–11)
AST: 16 U/L (ref 5–34)
Albumin: 3.4 g/dL — ABNORMAL LOW (ref 3.5–5.0)
Alkaline Phosphatase: 113 U/L (ref 40–150)
BUN: 20.5 mg/dL (ref 7.0–26.0)
CALCIUM: 9.1 mg/dL (ref 8.4–10.4)
CHLORIDE: 108 meq/L (ref 98–109)
CO2: 25 meq/L (ref 22–29)
Creatinine: 1.5 mg/dL — ABNORMAL HIGH (ref 0.7–1.3)
EGFR: 43 mL/min/{1.73_m2} — ABNORMAL LOW (ref >90)
Glucose: 148 mg/dl — ABNORMAL HIGH (ref 70–140)
POTASSIUM: 4 meq/L (ref 3.5–5.1)
Sodium: 142 mEq/L (ref 136–145)
Total Bilirubin: 1.1 mg/dL (ref 0.20–1.20)
Total Protein: 6.4 g/dL (ref 6.4–8.3)

## 2015-07-31 MED ORDER — ONDANSETRON HCL 8 MG PO TABS: 8.0000 mg | ORAL_TABLET | Freq: Three times a day (TID) | ORAL | Status: DC | PRN

## 2015-07-31 MED ORDER — PROCHLORPERAZINE MALEATE 10 MG PO TABS: 10.0000 mg | ORAL_TABLET | Freq: Four times a day (QID) | ORAL | Status: DC | PRN

## 2015-07-31 MED ORDER — LIDOCAINE-PRILOCAINE 2.5-2.5 % EX CREA: TOPICAL_CREAM | CUTANEOUS | Status: DC

## 2015-07-31 NOTE — Telephone Encounter (Signed)
Per staff message and POF I have scheduled appts. Advised scheduler of appts. JMW  

## 2015-07-31 NOTE — Telephone Encounter (Signed)
per pof to sch pt appt-gave pt copy of avs-sent MW email to sch trmt-pt to look @ [place] for updated copy

## 2015-08-01 NOTE — Assessment & Plan Note (Signed)
His renal failure is stable. He follows with nephrologist closely. We will proceed with treatment as long as creatinine is less than 2.

## 2015-08-01 NOTE — Assessment & Plan Note (Signed)
We have extensive discussion about his prognosis. He is getting weaker with poor performance status. Apparently he had an advanced directive at home but it has not been updated. He had gone through various different medical illness recently with GI bleed, recent stroke and altered mental status. Complication from chemotherapy is expected to be high due to his age and multiple comorbidities. We discussed about CODE STATUS and I recommended DO NOT RESUSCITATE The patient is not sure about this and I recommend he discuss this with family and we will revisit this issue in the next visit

## 2015-08-01 NOTE — Progress Notes (Signed)
. 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 Summary of his history is as follows: He was diagnosed with Mantle cell lymphoma diagnosed in March 2008 with positive bone marrow initially on 12/03/2006 and then negative bone marrow after treatment in October 2009. The patient presented with obstructive liver abnormalities requiring ERCP and non metal stent placement by Dr. Erskine Emery. The stent was subsequently removed in October 2008.  The patient was treated with 8 cycles of Rituxan, Cytoxan, vincristine and Decadron in combination with Neulasta from 12/19/2006 through 05/15/2007. The patient then received maintenance Rituxan from July 16, 2007 through February 01, 2010. While on Rituxan, he had a recurrence of his disease by CT scan carried out on 01/24/2010. Mr. Shafiq then received treatment with bendamustine and Rituxan in combination with Neulasta for 6 cycles from 02/19/2010 through 07/26/2010. PET scan from 08/22/2010 and CT scans of chest, abdomen, and pelvis from 01/01/2011 that showed no evidence of disease. He had been off treatment since December 2011 and had been doing well without any symptoms until the CT scan of the abdomen and pelvis on 07/01/2011 showed signs of recurrence. CT scans  of abdomen and pelvis with IVC on 09/02/11 showed further progression. Rituxan, subcutaneous Velcade, intravenous Cytoxan and Decadron were initiated on 10/15/2011. CT scans of the abdomen and pelvis carried out on 01/08/2012 showed a near complete response to therapy. CT scan of the abdomen and pelvis with IV contrast carried out on 06/19/2012 showed stable plaque-like soft tissue density in the upper abdominal retroperitoneum compared with the CT scan of 01/08/2012. CT scan of the abdomen and pelvis with IV contrast carried out on 11/30/2012 showed no evidence of recurrent lymphoma.  As of 08/05/2013, treatment program will be as follows:  -Velcade 2.75 mg subcu.  -Cytoxan 400 mg IV.  -Decadron 20 mg IV.  The above drugs will be given every 3 weeks.  -Rituxan 800 mg IV every 6 weeks.  Previously Velcade, Cytoxan, and Decadron were being administered every 2 weeks, and Rituxan was being administered every 4 weeks. (from 12/04/2012 - 08/05/2013). Prior to that beginning on 10/15/2011 - 12/04/2012, Velcade, Cytoxan, and Decadron were being administered weekly, and Rituxan was being administered every 2 weeks. In August of 2014, he was scheduled for chemotherapy but had chest pain and it was held. He underwent a cardiac cath which revealed two of three native vessel ischemic disease on 04/13/2013 but he was a poor surgical candidate for a re-do CABG. His therapy was re-started in October 2014. Overall he's tolerated his treatment without difficulty.  In April he had an accidental fall resulting in a left hip fracture. He underwent L THR on 12/09/2013 by Dr. Paralee Cancel. He was discharged to rehab. Prior to his fall, he had restaging scans including CT C/A/P on 04/09 which demonstrated absence of recurrent lymphoma in the  chest, abdomen or pelvis.  Repeat PET/CT scan on 07/27/2014 show significant disease progression. Rituximab was discontinued The patient was admitted to the hospital from  08/02/2014 to 08/12/2014 due to significant GI bleed from malignant tumor in his stomach that was cauterized , biopsy proven to be recurrence of lymphoma. He was in the intensive care unit due to hemorrhagic shock requiring significant blood transfusion. The patient also developed mild acute encephalopathy with minor neurological deficit which subsequently resolved. Ibrutinib was recently discontinued due to disease progression  INTERVAL HISTORY: Please see below for problem oriented charting. He complained of weakness. He has shortness of breath on minimal exertion. According to his wife, he is physically inactive. He has chronic bilateral leg swelling. Denies recent cough. No recent infection.  REVIEW OF SYSTEMS:   Constitutional: Denies fevers, chills or abnormal weight loss Eyes: Denies blurriness of vision Ears, nose, mouth, throat, and face: Denies mucositis or sore throat Cardiovascular: Denies palpitation, chest discomfort  Gastrointestinal:  Denies nausea, heartburn or change in bowel habits Skin: Denies abnormal skin rashes Lymphatics: Denies new lymphadenopathy or easy bruising Neurological:Denies numbness, tingling or new weaknesses Behavioral/Psych: Mood is stable, no new changes  All other systems were reviewed with the patient and are negative.  I have reviewed the past medical history, past surgical history, social history and family history with the patient and they are unchanged from previous note.  ALLERGIES:  is allergic to atorvastatin.  MEDICATIONS:  Current Outpatient Prescriptions  Medication Sig Dispense Refill  . amLODipine (NORVASC) 5 MG tablet TAKE ONE TABLET BY MOUTH ONCE DAILY 30 tablet 6  . atenolol (TENORMIN) 25 MG tablet Take 1 tablet (25 mg total) by mouth daily. 30 tablet 11  . chlorpheniramine-HYDROcodone (TUSSIONEX) 10-8 MG/5ML SUER Take 5 mLs by mouth every 12 (twelve) hours as needed for cough. 140 mL 0  . citalopram (CELEXA) 20 MG tablet TAKE  ONE TABLET BY MOUTH ONCE DAILY 90 tablet 2  . furosemide (LASIX) 40 MG tablet Take 0.5 tablets (20 mg total) by mouth daily. 30 tablet 7  . HYDROcodone-acetaminophen (NORCO/VICODIN) 5-325 MG per tablet Take 1 tablet by mouth every 6 (six) hours as needed for moderate pain. 30 tablet 0  . isosorbide mononitrate (IMDUR) 30 MG 24 hr tablet Take 3 tablets (90 mg total) by mouth daily. 90 tablet 5  . lactose free nutrition (BOOST PLUS) LIQD Take 237 mLs by mouth 2 (two) times daily as needed (Allow w/pt or pt's wife request after diet advancement). (Patient taking differently: Take 237 mLs by mouth 2 (two) times daily as needed (nutritional supplement). ) 60 Can 0  . methocarbamol (ROBAXIN) 500 MG tablet Take 1 tablet (500 mg total) by mouth every 6 (six) hours as needed for muscle spasms. 50 tablet 0  . nitroGLYCERIN (NITROSTAT) 0.4 MG SL tablet Place 1 tablet (0.4 mg total) under the tongue every 5 (five) minutes as needed for chest pain. 25 tablet prn  . pantoprazole (PROTONIX) 40 MG tablet TAKE ONE TABLET BY MOUTH TWICE DAILY 90 tablet 0  . pravastatin (PRAVACHOL) 80 MG tablet Take 1 tablet (80 mg total) by mouth daily. 90 tablet 1  . lidocaine-prilocaine (EMLA) cream Apply to affected area once 30 g 3  . ondansetron (ZOFRAN) 8 MG tablet Take 1 tablet (8 mg total) by mouth every 8 (eight) hours as needed. 30 tablet 1  . prochlorperazine (COMPAZINE) 10 MG tablet Take 1 tablet (10 mg total) by mouth every 6 (six) hours as needed (Nausea or  vomiting). 30 tablet 1   No current facility-administered medications for this visit.    PHYSICAL EXAMINATION: ECOG PERFORMANCE STATUS: 2 - Symptomatic, <50% confined to bed  Filed Vitals:   07/31/15 1358  BP: 115/58  Pulse: 78  Temp: 97.8 F (36.6 C)  Resp: 18   Filed Weights   07/31/15 1358  Weight: 197 lb 6.4 oz (89.54 kg)    GENERAL:alert, no distress and comfortable. He will elderly and frail SKIN: skin color, texture, turgor are normal, no  rashes or significant lesions EYES: normal, Conjunctiva are pink and non-injected, sclera clear OROPHARYNX:no exudate, no erythema and lips, buccal mucosa, and tongue normal  NECK: supple, thyroid normal size, non-tender, without nodularity LYMPH:  no palpable lymphadenopathy in the cervical, axillary or inguinal LUNGS: Normal breathing effort, reduced breath sounds on both lung bases  HEART: regular rate & rhythm and no murmurs with moderate bilateral lower extremity edema ABDOMEN:abdomen soft, non-tender and normal bowel sounds Musculoskeletal:no cyanosis of digits and no clubbing  NEURO: alert & oriented x 3 with fluent speech, no focal motor/sensory deficits  LABORATORY DATA:  I have reviewed the data as listed    Component Value Date/Time   NA 142 07/31/2015 1340   NA 142 12/17/2014 0510   NA 142 09/02/2011 0936   K 4.0 07/31/2015 1340   K 3.8 12/17/2014 0510   K 4.1 09/02/2011 0936   CL 99 12/17/2014 0510   CL 106 01/29/2013 0900   CL 98 09/02/2011 0936   CO2 25 07/31/2015 1340   CO2 34* 12/17/2014 0510   CO2 29 09/02/2011 0936   GLUCOSE 148* 07/31/2015 1340   GLUCOSE 74 12/17/2014 0510   GLUCOSE 101* 01/29/2013 0900   GLUCOSE 96 09/02/2011 0936   BUN 20.5 07/31/2015 1340   BUN 27* 12/17/2014 0510   BUN 17 09/02/2011 0936   CREATININE 1.5* 07/31/2015 1340   CREATININE 1.56* 12/17/2014 0510   CREATININE 0.8 09/02/2011 0936   CALCIUM 9.1 07/31/2015 1340   CALCIUM 8.5 12/17/2014 0510   CALCIUM 8.5 09/02/2011 0936   PROT 6.4 07/31/2015 1340   PROT 5.9* 12/15/2014 0442   PROT 7.1 09/02/2011 0936   ALBUMIN 3.4* 07/31/2015 1340   ALBUMIN 3.2* 12/15/2014 0442   ALBUMIN 3.4 09/02/2011 0936   AST 16 07/31/2015 1340   AST 17 12/15/2014 0442   AST 21 09/02/2011 0936   ALT <9 07/31/2015 1340   ALT 7 12/15/2014 0442   ALT 17 09/02/2011 0936   ALKPHOS 113 07/31/2015 1340   ALKPHOS 90 12/15/2014 0442   ALKPHOS 72 09/02/2011 0936   BILITOT 1.10 07/31/2015 1340   BILITOT  1.3* 12/15/2014 0442   BILITOT 0.60 09/02/2011 0936   GFRNONAA 40* 12/17/2014 0510   GFRAA 47* 12/17/2014 0510    No results found for: SPEP, UPEP  Lab Results  Component Value Date   WBC 2.1* 07/31/2015   NEUTROABS 0.5* 07/31/2015   HGB 12.6* 07/31/2015   HCT 39.4 07/31/2015   MCV 92.1 07/31/2015   PLT 174 07/31/2015      Chemistry      Component Value Date/Time   NA 142 07/31/2015 1340   NA 142 12/17/2014 0510   NA 142 09/02/2011 0936   K 4.0 07/31/2015 1340   K 3.8 12/17/2014 0510   K 4.1 09/02/2011 0936   CL 99 12/17/2014 0510   CL 106 01/29/2013 0900   CL 98 09/02/2011 0936   CO2 25 07/31/2015 1340   CO2 34* 12/17/2014 0510  CO2 29 09/02/2011 0936   BUN 20.5 07/31/2015 1340   BUN 27* 12/17/2014 0510   BUN 17 09/02/2011 0936   CREATININE 1.5* 07/31/2015 1340   CREATININE 1.56* 12/17/2014 0510   CREATININE 0.8 09/02/2011 0936      Component Value Date/Time   CALCIUM 9.1 07/31/2015 1340   CALCIUM 8.5 12/17/2014 0510   CALCIUM 8.5 09/02/2011 0936   ALKPHOS 113 07/31/2015 1340   ALKPHOS 90 12/15/2014 0442   ALKPHOS 72 09/02/2011 0936   AST 16 07/31/2015 1340   AST 17 12/15/2014 0442   AST 21 09/02/2011 0936   ALT <9 07/31/2015 1340   ALT 7 12/15/2014 0442   ALT 17 09/02/2011 0936   BILITOT 1.10 07/31/2015 1340   BILITOT 1.3* 12/15/2014 0442   BILITOT 0.60 09/02/2011 0936       RADIOGRAPHIC STUDIES: I reviewed the most recent PET CT scan with him and his wife I have personally reviewed the radiological images as listed and agreed with the findings in the report.    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 He had multiple region of abnormality is a multiple lymph nodes and bilateral pleural effusion which is suspect is due to relapsed lymphoma. He has discontinue the peritoneum due to disease progression. I suspect his neutropenia is also related to  that. We discussed the risks and benefits of repeat biopsy. He had various different biopsy sites had all came back positive for mantle cell lymphoma. We discussed treatment options and prognosis. Ultimately, we agreed not to pursue further biopsy. The patient had been treated with bendamustine back in 2008 with excellent response to treatment. He had multiple treatment with rituximab already and I do not feel strongly I want to add that back on as I suspect it would not be helpful and predisposing with risk of life-threatening allergic reaction. I discussed with him the risks, benefits, side effects of bendamustine including risks of pancytopenia, life-threatening infection, need for transfusion, etc. and he agreed to proceed. He is aware that response rate would be lower due to recurrent treatment. We will proceed with treatment starting next week. I plan to restage him after 2 cycles of treatment.  Chronic kidney disease (CKD), stage II (mild) His renal failure is stable. He follows with nephrologist closely. We will proceed with treatment as long as creatinine is less than 2.     Neutropenia, drug-induced This is likely due to recent treatment. The patient denies recent history of fevers, cough, chills, diarrhea or dysuria. He is asymptomatic from the neutropenia. I will observe for now.   He has discontinued Ibrutinib. it will take his white count to recover for a while but I will proceed with treatment as I also suspect part of the neutropenia is due to lingering, untreated lymphoma. We will cover him with G-CSF and agreed to try Onpro    Bilateral leg edema This is multifactorial, related to low protein state, chronic immobility, chronic kidney disease and refuses to take Lasix. I recommend he take Lasix scheduled and I will reassess the next visit  Palliative care encounter We have extensive discussion about his prognosis. He is getting weaker with poor performance  status. Apparently he had an advanced directive at home but it has not been updated. He had gone through various different medical illness recently with GI bleed, recent stroke and altered mental status. Complication from chemotherapy is expected  to be high due to his age and multiple comorbidities. We discussed about CODE STATUS and I recommended DO NOT RESUSCITATE The patient is not sure about this and I recommend he discuss this with family and we will revisit this issue in the next visit   Orders Placed This Encounter  Procedures  . CBC with Differential    Standing Status: Standing     Number of Occurrences: 20     Standing Expiration Date: 07/31/2016  . Comprehensive metabolic panel    Standing Status: Standing     Number of Occurrences: 20     Standing Expiration Date: 07/31/2016  . Hold Tube, Blood Bank    Standing Status: Future     Number of Occurrences:      Standing Expiration Date: 09/03/2016   All questions were answered. The patient knows to call the clinic with any problems, questions or concerns. No barriers to learning was detected. I spent 40 minutes counseling the patient face to face. The total time spent in the appointment was 55 minutes and more than 50% was on counseling and review of test results     [facility], [place], MD 08/01/2015 9:39 AM

## 2015-08-01 NOTE — Assessment & Plan Note (Signed)
I reviewed the imaging study with him and his wife. He had multiple region of abnormality is a multiple lymph nodes and bilateral pleural effusion which is suspect is due to relapsed lymphoma. He has discontinue the peritoneum due to disease progression. I suspect his neutropenia is also related to that. We discussed the risks and benefits of repeat biopsy. He had various different biopsy sites had all came back positive for mantle cell lymphoma. We discussed treatment options and prognosis. Ultimately, we agreed not to pursue further biopsy. The patient had been treated with bendamustine back in 2008 with excellent response to treatment. He had multiple treatment with rituximab already and I do not feel strongly I want to add that back on as I suspect it would not be helpful and predisposing with risk of life-threatening allergic reaction. I discussed with him the risks, benefits, side effects of bendamustine including risks of pancytopenia, life-threatening infection, need for transfusion, etc. and he agreed to proceed. He is aware that response rate would be lower due to recurrent treatment. We will proceed with treatment starting next week. I plan to restage him after 2 cycles of treatment.

## 2015-08-01 NOTE — Assessment & Plan Note (Signed)
This is multifactorial, related to low protein state, chronic immobility, chronic kidney disease and refuses to take Lasix. I recommend he take Lasix scheduled and I will reassess the next visit

## 2015-08-01 NOTE — Assessment & Plan Note (Signed)
This is likely due to recent treatment. The patient denies recent history of fevers, cough, chills, diarrhea or dysuria. He is asymptomatic from the neutropenia. I will observe for now.   He has discontinued Ibrutinib. it will take his white count to recover for a while but I will proceed with treatment as I also suspect part of the neutropenia is due to lingering, untreated lymphoma. We will cover him with G-CSF and agreed to try Onpro

## 2015-08-02 ENCOUNTER — Telehealth: Payer: Self-pay | Admitting: *Deleted

## 2015-08-02 NOTE — Telephone Encounter (Signed)
TC from wife stating that pt is having difficulty breathing to the point where he cannot lay down, has been sitting up in chair last night and all day. No oxygen at home.  Advised pt's wife that she needs to get him to ED for evaluation. Pt was seen by Dr. Alvy Bimler on 07/31/15 -declining performance status, bilateral pleural effusions.  Wife agreed and will take him to [facility] ED now.

## 2015-08-03 ENCOUNTER — Other Ambulatory Visit: Payer: Self-pay

## 2015-08-03 ENCOUNTER — Telehealth: Payer: Self-pay | Admitting: *Deleted

## 2015-08-03 ENCOUNTER — Other Ambulatory Visit (HOSPITAL_COMMUNITY): Payer: Self-pay

## 2015-08-03 ENCOUNTER — Emergency Department (HOSPITAL_COMMUNITY): Payer: Medicare Other

## 2015-08-03 ENCOUNTER — Inpatient Hospital Stay (HOSPITAL_COMMUNITY)
Admission: EM | Admit: 2015-08-03 | Discharge: 2015-08-27 | DRG: 291 | Disposition: E | Payer: Medicare Other | Attending: Internal Medicine | Admitting: Internal Medicine

## 2015-08-03 ENCOUNTER — Encounter (HOSPITAL_COMMUNITY): Payer: Self-pay

## 2015-08-03 ENCOUNTER — Inpatient Hospital Stay (HOSPITAL_COMMUNITY): Payer: Medicare Other | Admitting: Anesthesiology

## 2015-08-03 DIAGNOSIS — I2510 Atherosclerotic heart disease of native coronary artery without angina pectoris: Secondary | ICD-10-CM | POA: Diagnosis present

## 2015-08-03 DIAGNOSIS — R7989 Other specified abnormal findings of blood chemistry: Secondary | ICD-10-CM | POA: Diagnosis present

## 2015-08-03 DIAGNOSIS — J181 Lobar pneumonia, unspecified organism: Secondary | ICD-10-CM | POA: Diagnosis present

## 2015-08-03 DIAGNOSIS — I469 Cardiac arrest, cause unspecified: Secondary | ICD-10-CM | POA: Diagnosis not present

## 2015-08-03 DIAGNOSIS — Z9221 Personal history of antineoplastic chemotherapy: Secondary | ICD-10-CM | POA: Diagnosis not present

## 2015-08-03 DIAGNOSIS — T501X5A Adverse effect of loop [high-ceiling] diuretics, initial encounter: Secondary | ICD-10-CM | POA: Diagnosis present

## 2015-08-03 DIAGNOSIS — I5033 Acute on chronic diastolic (congestive) heart failure: Principal | ICD-10-CM | POA: Diagnosis present

## 2015-08-03 DIAGNOSIS — Z951 Presence of aortocoronary bypass graft: Secondary | ICD-10-CM

## 2015-08-03 DIAGNOSIS — R011 Cardiac murmur, unspecified: Secondary | ICD-10-CM | POA: Diagnosis present

## 2015-08-03 DIAGNOSIS — Z96642 Presence of left artificial hip joint: Secondary | ICD-10-CM | POA: Diagnosis present

## 2015-08-03 DIAGNOSIS — Z888 Allergy status to other drugs, medicaments and biological substances status: Secondary | ICD-10-CM | POA: Diagnosis not present

## 2015-08-03 DIAGNOSIS — E785 Hyperlipidemia, unspecified: Secondary | ICD-10-CM | POA: Diagnosis present

## 2015-08-03 DIAGNOSIS — D630 Anemia in neoplastic disease: Secondary | ICD-10-CM | POA: Diagnosis present

## 2015-08-03 DIAGNOSIS — T451X5A Adverse effect of antineoplastic and immunosuppressive drugs, initial encounter: Secondary | ICD-10-CM

## 2015-08-03 DIAGNOSIS — M25511 Pain in right shoulder: Secondary | ICD-10-CM | POA: Diagnosis not present

## 2015-08-03 DIAGNOSIS — I214 Non-ST elevation (NSTEMI) myocardial infarction: Secondary | ICD-10-CM | POA: Diagnosis present

## 2015-08-03 DIAGNOSIS — J9811 Atelectasis: Secondary | ICD-10-CM | POA: Diagnosis present

## 2015-08-03 DIAGNOSIS — Z79899 Other long term (current) drug therapy: Secondary | ICD-10-CM | POA: Diagnosis not present

## 2015-08-03 DIAGNOSIS — N183 Chronic kidney disease, stage 3 unspecified: Secondary | ICD-10-CM | POA: Diagnosis present

## 2015-08-03 DIAGNOSIS — N179 Acute kidney failure, unspecified: Secondary | ICD-10-CM

## 2015-08-03 DIAGNOSIS — I509 Heart failure, unspecified: Secondary | ICD-10-CM

## 2015-08-03 DIAGNOSIS — R778 Other specified abnormalities of plasma proteins: Secondary | ICD-10-CM | POA: Diagnosis present

## 2015-08-03 DIAGNOSIS — I10 Essential (primary) hypertension: Secondary | ICD-10-CM

## 2015-08-03 DIAGNOSIS — C8590 Non-Hodgkin lymphoma, unspecified, unspecified site: Secondary | ICD-10-CM | POA: Diagnosis present

## 2015-08-03 DIAGNOSIS — I130 Hypertensive heart and chronic kidney disease with heart failure and stage 1 through stage 4 chronic kidney disease, or unspecified chronic kidney disease: Secondary | ICD-10-CM | POA: Diagnosis present

## 2015-08-03 DIAGNOSIS — E440 Moderate protein-calorie malnutrition: Secondary | ICD-10-CM

## 2015-08-03 DIAGNOSIS — J9601 Acute respiratory failure with hypoxia: Secondary | ICD-10-CM

## 2015-08-03 DIAGNOSIS — C8310 Mantle cell lymphoma, unspecified site: Secondary | ICD-10-CM | POA: Diagnosis present

## 2015-08-03 DIAGNOSIS — I5031 Acute diastolic (congestive) heart failure: Secondary | ICD-10-CM | POA: Diagnosis present

## 2015-08-03 DIAGNOSIS — D72819 Decreased white blood cell count, unspecified: Secondary | ICD-10-CM

## 2015-08-03 DIAGNOSIS — J90 Pleural effusion, not elsewhere classified: Secondary | ICD-10-CM | POA: Diagnosis present

## 2015-08-03 DIAGNOSIS — D701 Agranulocytosis secondary to cancer chemotherapy: Secondary | ICD-10-CM | POA: Diagnosis present

## 2015-08-03 DIAGNOSIS — R0600 Dyspnea, unspecified: Secondary | ICD-10-CM

## 2015-08-03 DIAGNOSIS — I248 Other forms of acute ischemic heart disease: Secondary | ICD-10-CM | POA: Diagnosis present

## 2015-08-03 DIAGNOSIS — C8318 Mantle cell lymphoma, lymph nodes of multiple sites: Secondary | ICD-10-CM

## 2015-08-03 DIAGNOSIS — Z955 Presence of coronary angioplasty implant and graft: Secondary | ICD-10-CM | POA: Diagnosis not present

## 2015-08-03 DIAGNOSIS — Z8249 Family history of ischemic heart disease and other diseases of the circulatory system: Secondary | ICD-10-CM | POA: Diagnosis not present

## 2015-08-03 LAB — BLOOD GAS, ARTERIAL
Acid-base deficit: 12.1 mmol/L — ABNORMAL HIGH (ref 0.0–2.0)
BICARBONATE: 14.4 meq/L — AB (ref 20.0–24.0)
DRAWN BY: 308601
FIO2: 1
O2 SAT: 92.5 %
PATIENT TEMPERATURE: 98.6
PH ART: 7.229 — AB (ref 7.350–7.450)
TCO2: 13.4 mmol/L (ref 0–100)
pCO2 arterial: 35.8 mmHg (ref 35.0–45.0)
pO2, Arterial: 84.3 mmHg (ref 80.0–100.0)

## 2015-08-03 LAB — CBC WITH DIFFERENTIAL/PLATELET
BASOS ABS: 0 10*3/uL (ref 0.0–0.1)
Basophils Relative: 1 %
Eosinophils Absolute: 0.2 10*3/uL (ref 0.0–0.7)
Eosinophils Relative: 6 %
HCT: 40.2 % (ref 39.0–52.0)
HEMOGLOBIN: 12.6 g/dL — AB (ref 13.0–17.0)
LYMPHS ABS: 1.1 10*3/uL (ref 0.7–4.0)
Lymphocytes Relative: 38 %
MCH: 29.4 pg (ref 26.0–34.0)
MCHC: 31.3 g/dL (ref 30.0–36.0)
MCV: 93.9 fL (ref 78.0–100.0)
MONO ABS: 0.9 10*3/uL (ref 0.1–1.0)
MONOS PCT: 30 %
NEUTROS PCT: 25 %
Neutro Abs: 0.7 10*3/uL — ABNORMAL LOW (ref 1.7–7.7)
PLATELETS: 198 10*3/uL (ref 150–400)
RBC: 4.28 MIL/uL (ref 4.22–5.81)
RDW: 15 % (ref 11.5–15.5)
WBC: 2.9 10*3/uL — AB (ref 4.0–10.5)

## 2015-08-03 LAB — PROTIME-INR
INR: 1.19 (ref 0.00–1.49)
Prothrombin Time: 15.2 seconds (ref 11.6–15.2)

## 2015-08-03 LAB — COMPREHENSIVE METABOLIC PANEL
ALBUMIN: 3.8 g/dL (ref 3.5–5.0)
ALK PHOS: 104 U/L (ref 38–126)
ALT: 14 U/L — AB (ref 17–63)
AST: 25 U/L (ref 15–41)
Anion gap: 11 (ref 5–15)
BUN: 25 mg/dL — ABNORMAL HIGH (ref 6–20)
CALCIUM: 9.1 mg/dL (ref 8.9–10.3)
CHLORIDE: 105 mmol/L (ref 101–111)
CO2: 27 mmol/L (ref 22–32)
CREATININE: 1.64 mg/dL — AB (ref 0.61–1.24)
GFR calc non Af Amer: 38 mL/min — ABNORMAL LOW (ref >60)
GFR, EST AFRICAN AMERICAN: 44 mL/min — AB (ref >60)
GLUCOSE: 111 mg/dL — AB (ref 65–99)
Potassium: 4.5 mmol/L (ref 3.5–5.1)
SODIUM: 143 mmol/L (ref 135–145)
Total Bilirubin: 1.3 mg/dL — ABNORMAL HIGH (ref 0.3–1.2)
Total Protein: 6.6 g/dL (ref 6.5–8.1)

## 2015-08-03 LAB — MRSA PCR SCREENING: MRSA by PCR: NEGATIVE

## 2015-08-03 LAB — BRAIN NATRIURETIC PEPTIDE: B Natriuretic Peptide: 1757.9 pg/mL — ABNORMAL HIGH (ref 0.0–100.0)

## 2015-08-03 LAB — TROPONIN I
TROPONIN I: 1.33 ng/mL — AB (ref <0.031)
TROPONIN I: 1.07 ng/mL — AB (ref <0.031)

## 2015-08-03 LAB — APTT: aPTT: 29 seconds (ref 24–37)

## 2015-08-03 IMAGING — CT CT ABD-PELV W/ CM
2 of 5 series · 15 of 36 positions shown, 18 images · IV contrast (OMNIPAQUE)
Comparison: CT abdomen pelvis dated 11/30/2012.  CT chest abdomen
pelvis dated 01/01/2011.

CT CHEST

CLINICAL DATA: Mantle cell lymphoma, chemotherapy ongoing.
History of thymic cancer status post XRT.

CT CHEST, ABDOMEN AND PELVIS WITH CONTRAST
TECHNIQUE: Multidetector CT imaging of the chest, abdomen and
pelvis was performed following the standard protocol during bolus
administration of intravenous contrast.
Contrast: 100mL OMNIPAQUE IOHEXOL 300 MG/ML  SOLN

[Series 2: cap with st · axial · 0.76mm/px · z∈[-586,-66]mm · 12 of 120 slices shown, 15 images]
[im 8/120  mediastinal]
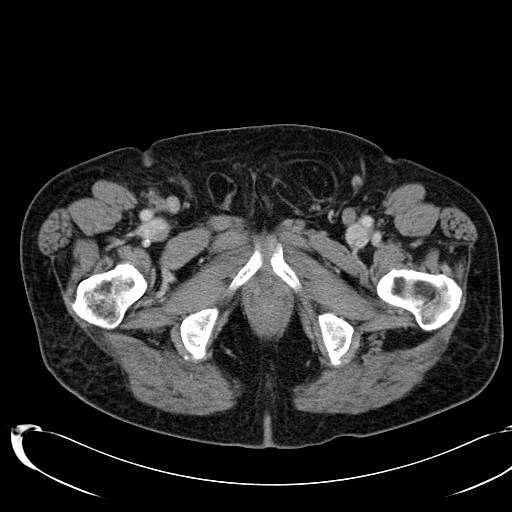
[im 8/120  lung]
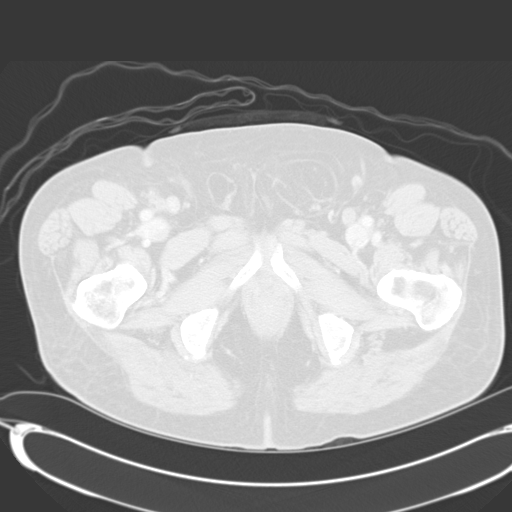
[im 16/120  lung]
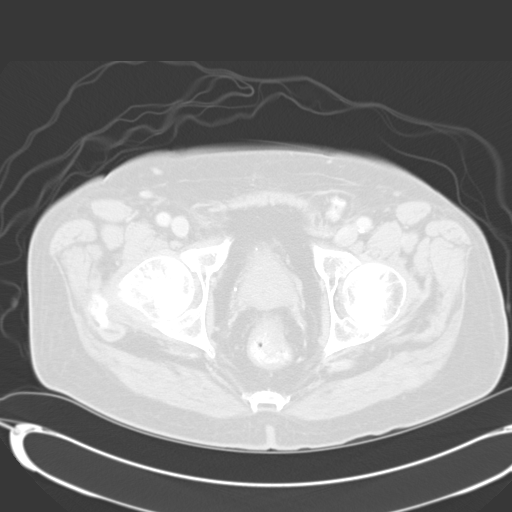
[im 24/120  lung]
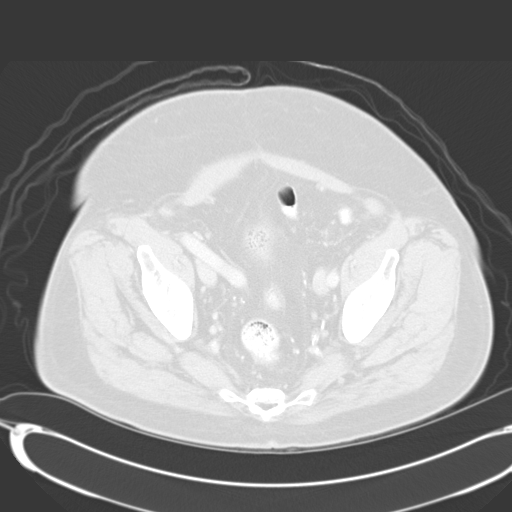
[im 40/120  lung]
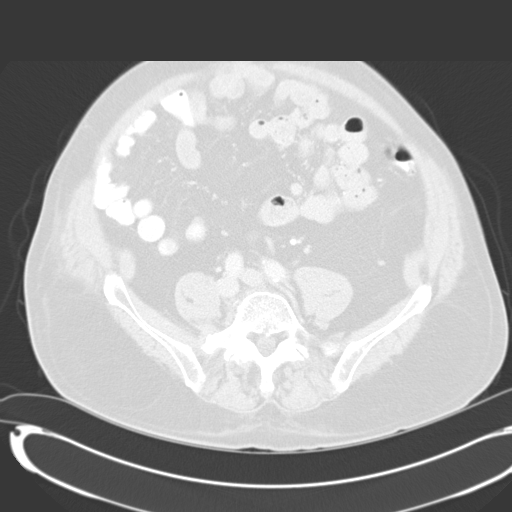
[im 48/120  mediastinal]
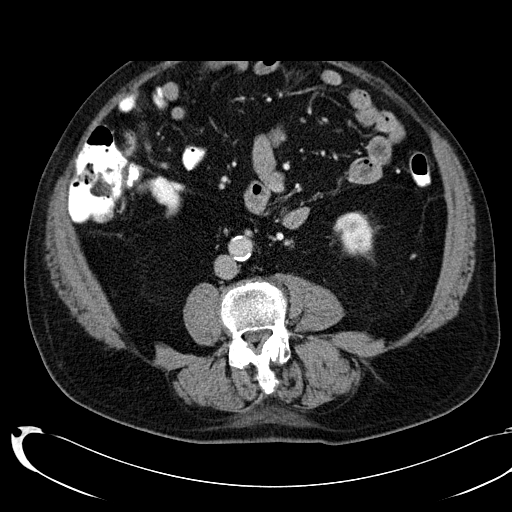
[im 48/120  lung]
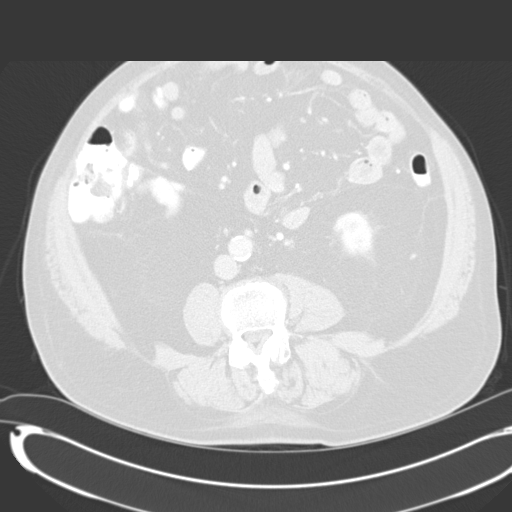
[im 56/120  lung]
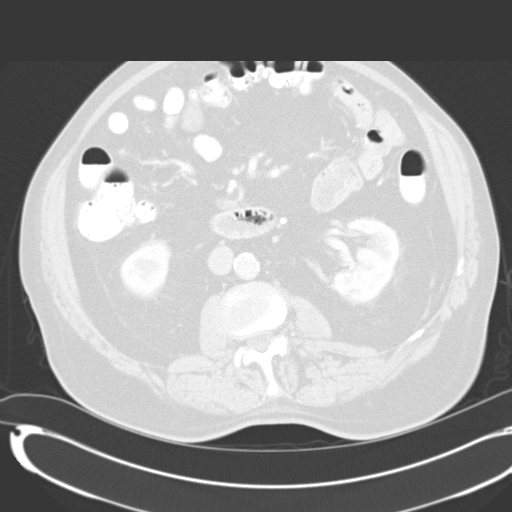
[im 64/120  lung]
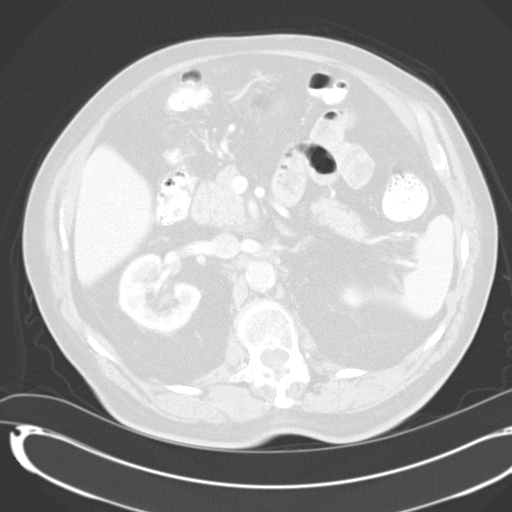
[im 72/120  lung]
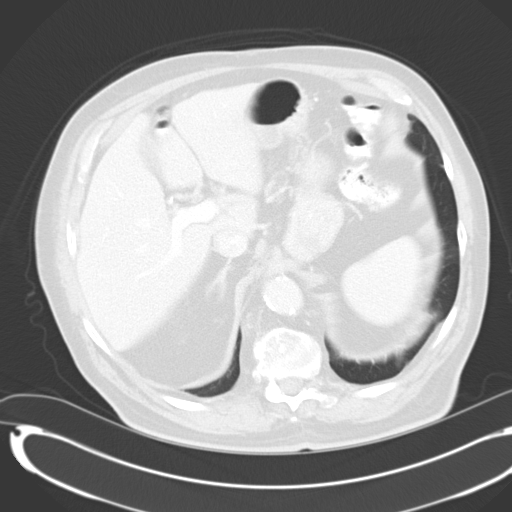
[im 80/120  mediastinal]
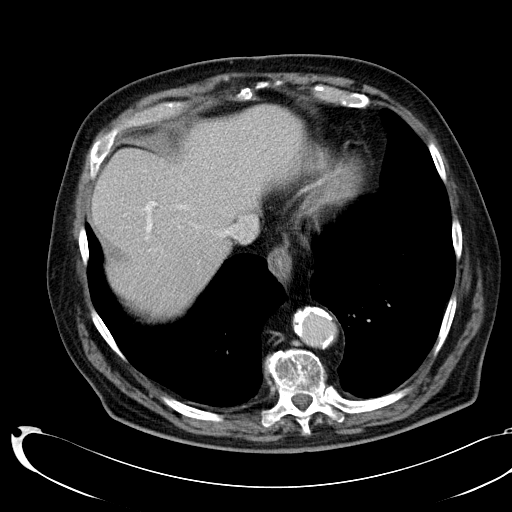
[im 80/120  lung]
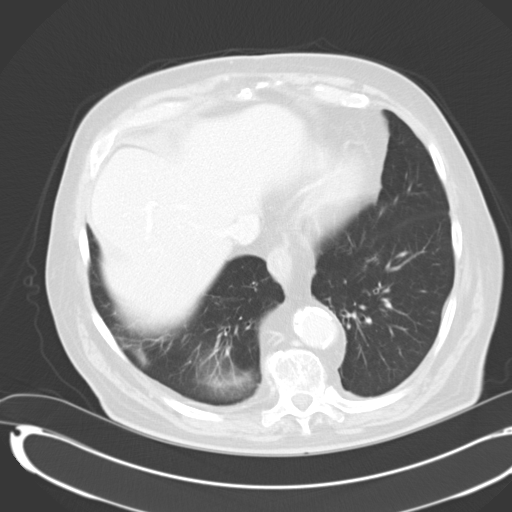
[im 96/120  lung]
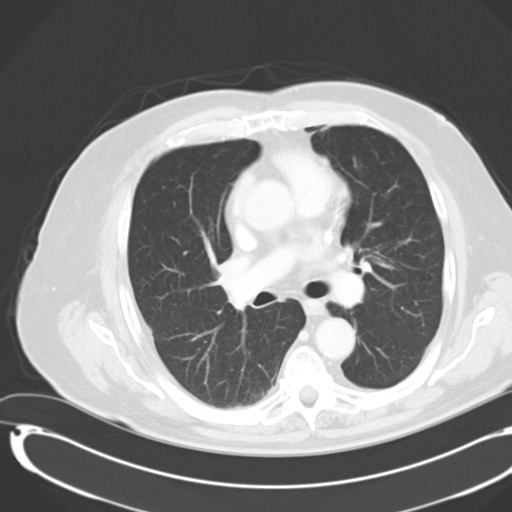
[im 104/120  lung]
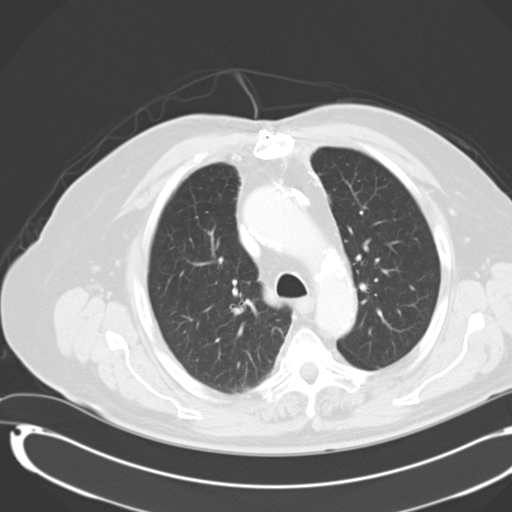
[im 112/120  lung]
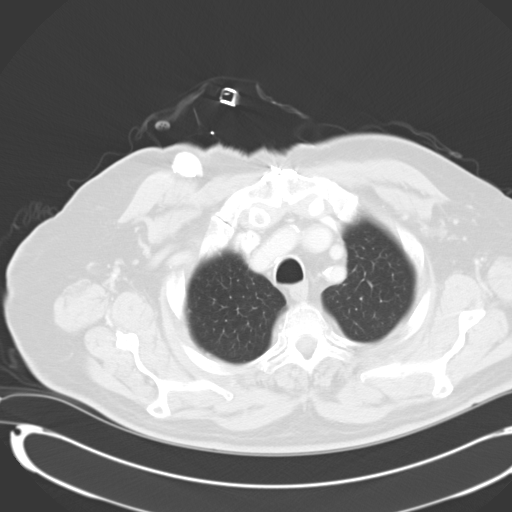

[Series 602: coronal imagesd · coronal · 1.21mm/px · 3 of 108 slices shown]
[im 22/108  lung]
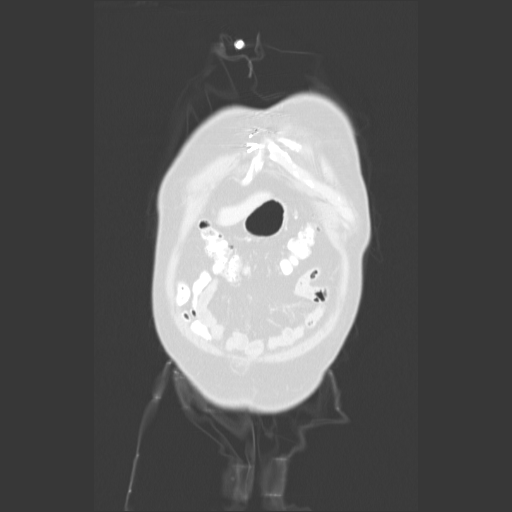
[im 43/108  lung]
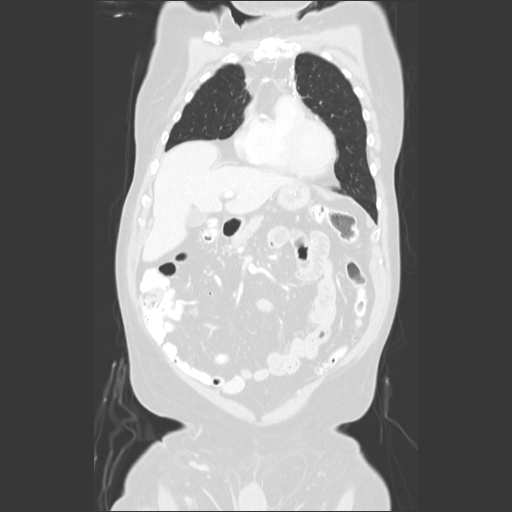
[im 65/108  lung]
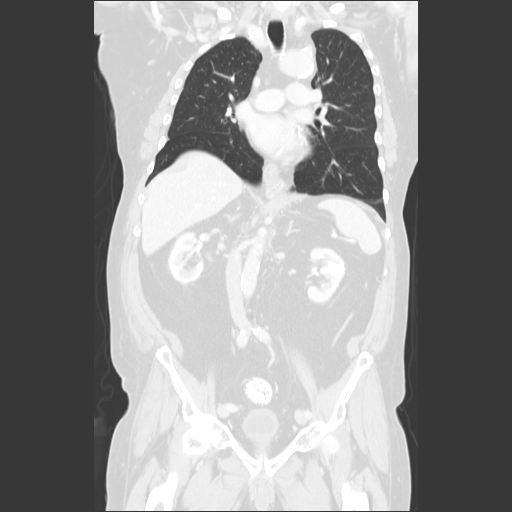

[15 of 36 positions shown; findings below may reference images not displayed]

FINDINGS: Mild scarring in the right upper and lower lobes.  No
suspicious pulmonary nodules.  No pleural effusion or pneumothorax.

Visualized thyroid is unremarkable.

The heart is normal in size.  No pericardial effusion.  Coronary
atherosclerosis. Postsurgical changes related to prior CABG.
Atherosclerotic calcifications of the aortic arch.

Surgical clips along the anterior mediastinum.

Right chest port.

6 mm short-axis right paratracheal node (series 2/image 19).  No
suspicious mediastinal, hilar, or axillary lymphadenopathy.

Degenerative changes of the thoracic spine.
IMPRESSION: No evidence of lymphomatous involvement in the chest.

CT ABDOMEN AND PELVIS
FINDINGS: Liver, spleen, pancreas, and adrenal glands are within
normal limits.

Gallbladder is unremarkable.  No intrahepatic or extrahepatic
ductal dilatation.

Kidneys are within normal limits.  No hydronephrosis.

No evidence of bowel obstruction.  Normal appendix.  Colonic
diverticulosis, without associated inflammatory changes.

Atherosclerotic calcifications of the abdominal aorta and branch
vessels.

Mild stranding along the celiac axis (series 2/image 53), likely
reflecting the site of prior/treated lymphoma.

No suspicious abdominopelvic lymphadenopathy.

Prostate is unremarkable.

Mildly thick-walled bladder, correlate for cystitis.

Moderate left inguinal hernia containing fat and a short segment of
sigmoid colon (series 2/image 105).

Mild degenerative changes of the lumbar spine.
IMPRESSION: No evidence of lymphomatous involvement in the abdomen/pelvis.

Mildly thick-walled bladder, correlate for cystitis.

## 2015-08-03 MED ORDER — SODIUM CHLORIDE 0.9 % IJ SOLN: 3.0000 mL | Freq: Two times a day (BID) | INTRAMUSCULAR | Status: DC

## 2015-08-03 MED ORDER — PROCHLORPERAZINE MALEATE 10 MG PO TABS: 10.0000 mg | ORAL_TABLET | Freq: Four times a day (QID) | ORAL | Status: DC | PRN

## 2015-08-03 MED ORDER — SODIUM CHLORIDE 0.9 % IV SOLN: 250.0000 mL | INTRAVENOUS | Status: DC | PRN

## 2015-08-03 MED ORDER — HYDROCOD POLST-CPM POLST ER 10-8 MG/5ML PO SUER: 5.0000 mL | Freq: Two times a day (BID) | ORAL | Status: DC | PRN

## 2015-08-03 MED ORDER — TENECTEPLASE 50 MG IV KIT
45.0000 mg | PACK | Freq: Once | INTRAVENOUS | Status: DC
  Filled 2015-08-03: qty 10

## 2015-08-03 MED ORDER — HEPARIN BOLUS VIA INFUSION
4000.0000 [IU] | INTRAVENOUS | Status: AC
  Administered 2015-08-03: 4000 [IU] via INTRAVENOUS
  Filled 2015-08-03: qty 4000

## 2015-08-03 MED ORDER — ISOSORBIDE MONONITRATE ER 60 MG PO TB24
90.0000 mg | ORAL_TABLET | Freq: Every day | ORAL | Status: DC
  Filled 2015-08-03: qty 1

## 2015-08-03 MED ORDER — SODIUM CHLORIDE 0.9 % IJ SOLN: 3.0000 mL | INTRAMUSCULAR | Status: DC | PRN

## 2015-08-03 MED ORDER — SODIUM CHLORIDE 0.9 % IJ SOLN
3.0000 mL | Freq: Two times a day (BID) | INTRAMUSCULAR | Status: DC
  Administered 2015-08-03: 3 mL via INTRAVENOUS

## 2015-08-03 MED ORDER — BOOST PLUS PO LIQD: 237.0000 mL | Freq: Every day | ORAL | Status: DC

## 2015-08-03 MED ORDER — TENECTEPLASE 50 MG IV KIT
45.0000 mg | PACK | Freq: Once | INTRAVENOUS | Status: AC
  Administered 2015-08-03: 45 mg via INTRAVENOUS
  Filled 2015-08-03: qty 10

## 2015-08-03 MED ORDER — PRAVASTATIN SODIUM 80 MG PO TABS
80.0000 mg | ORAL_TABLET | Freq: Every day | ORAL | Status: DC
  Administered 2015-08-03: 80 mg via ORAL
  Filled 2015-08-03 (×2): qty 1

## 2015-08-03 MED ORDER — LEVOFLOXACIN IN D5W 750 MG/150ML IV SOLN
750.0000 mg | INTRAVENOUS | Status: DC
  Administered 2015-08-03: 750 mg via INTRAVENOUS
  Filled 2015-08-03: qty 150

## 2015-08-03 MED ORDER — ACETAMINOPHEN 325 MG PO TABS: 650.0000 mg | ORAL_TABLET | ORAL | Status: DC | PRN

## 2015-08-03 MED ORDER — ONDANSETRON HCL 4 MG/2ML IJ SOLN: 4.0000 mg | Freq: Four times a day (QID) | INTRAMUSCULAR | Status: DC | PRN

## 2015-08-03 MED ORDER — EPINEPHRINE HCL 1 MG/ML IJ SOLN
50.0000 ug/min | INTRAVENOUS | Status: DC
  Administered 2015-08-03: 50 ug/min via INTRAVENOUS
  Filled 2015-08-03: qty 4

## 2015-08-03 MED ORDER — HYDROCODONE-ACETAMINOPHEN 5-325 MG PO TABS
1.0000 | ORAL_TABLET | Freq: Four times a day (QID) | ORAL | Status: DC | PRN
  Administered 2015-08-03: 1 via ORAL
  Filled 2015-08-03: qty 1

## 2015-08-03 MED ORDER — ATENOLOL 25 MG PO TABS
25.0000 mg | ORAL_TABLET | Freq: Every day | ORAL | Status: DC
  Filled 2015-08-03: qty 1

## 2015-08-03 MED ORDER — FUROSEMIDE 10 MG/ML IJ SOLN
40.0000 mg | INTRAMUSCULAR | Status: AC
  Administered 2015-08-03: 40 mg via INTRAVENOUS
  Filled 2015-08-03: qty 4

## 2015-08-03 MED ORDER — ALBUTEROL SULFATE (2.5 MG/3ML) 0.083% IN NEBU: 5.0000 mg | INHALATION_SOLUTION | Freq: Once | RESPIRATORY_TRACT | Status: DC

## 2015-08-03 MED ORDER — FUROSEMIDE 40 MG PO TABS: 20.0000 mg | ORAL_TABLET | Freq: Every day | ORAL | Status: DC

## 2015-08-03 MED ORDER — CITALOPRAM HYDROBROMIDE 20 MG PO TABS
20.0000 mg | ORAL_TABLET | Freq: Every day | ORAL | Status: DC
  Administered 2015-08-03: 20 mg via ORAL
  Filled 2015-08-03: qty 1

## 2015-08-03 MED ORDER — ASPIRIN 81 MG PO CHEW
324.0000 mg | CHEWABLE_TABLET | Freq: Once | ORAL | Status: AC
  Administered 2015-08-03: 324 mg via ORAL
  Filled 2015-08-03: qty 4

## 2015-08-03 MED ORDER — METHOCARBAMOL 500 MG PO TABS
500.0000 mg | ORAL_TABLET | Freq: Four times a day (QID) | ORAL | Status: DC | PRN
  Filled 2015-08-03: qty 1

## 2015-08-03 MED ORDER — SODIUM CHLORIDE 0.9 % IV SOLN
INTRAVENOUS | Status: DC
  Administered 2015-08-03: via INTRAVENOUS

## 2015-08-03 MED ORDER — NITROGLYCERIN 2 % TD OINT
1.0000 [in_us] | TOPICAL_OINTMENT | Freq: Once | TRANSDERMAL | Status: AC
  Administered 2015-08-03: 1 [in_us] via TOPICAL
  Filled 2015-08-03: qty 30

## 2015-08-03 MED ORDER — HEPARIN (PORCINE) IN NACL 100-0.45 UNIT/ML-% IJ SOLN
1200.0000 [IU]/h | INTRAMUSCULAR | Status: DC
  Filled 2015-08-03: qty 250

## 2015-08-03 MED ORDER — HEPARIN (PORCINE) IN NACL 100-0.45 UNIT/ML-% IJ SOLN
1200.0000 [IU]/h | INTRAMUSCULAR | Status: AC
  Administered 2015-08-03: 1200 [IU]/h via INTRAVENOUS
  Filled 2015-08-03: qty 250

## 2015-08-03 MED ORDER — PANTOPRAZOLE SODIUM 40 MG PO TBEC
40.0000 mg | DELAYED_RELEASE_TABLET | Freq: Two times a day (BID) | ORAL | Status: DC
  Administered 2015-08-03: 40 mg via ORAL
  Filled 2015-08-03: qty 1

## 2015-08-03 MED ORDER — NAPROXEN 250 MG PO TABS
250.0000 mg | ORAL_TABLET | Freq: Two times a day (BID) | ORAL | Status: DC | PRN
Start: 2015-08-03 — End: 2015-08-04
  Filled 2015-08-03: qty 1

## 2015-08-04 ENCOUNTER — Other Ambulatory Visit: Payer: Self-pay | Admitting: Hematology and Oncology

## 2015-08-04 ENCOUNTER — Ambulatory Visit (HOSPITAL_COMMUNITY): Payer: Medicare Other

## 2015-08-04 MED FILL — Medication: Qty: 1 | Status: AC

## 2015-08-04 NOTE — Anesthesia Procedure Notes (Signed)
Procedure Name: Intubation Date/Time: 08/22/15 10:31 PM Performed by: Alexis Frock Pre-anesthesia Checklist: Patient identified, Emergency Drugs available, Suction available, Patient being monitored and Timeout performed Oxygen Delivery Method: Ambu bag Preoxygenation: Pre-oxygenation with 100% oxygen Ventilation: Mask ventilation without difficulty Laryngoscope Size: Mac and 3 Grade View: Grade III Tube type: Oral Tube size: 7.5 mm Number of attempts: 1 Airway Equipment and Method: Stylet Placement Confirmation: positive ETCO2,  CO2 detector and breath sounds checked- equal and bilateral Secured at: 21 cm Tube secured with: Tape Comments: Intubated during CODE BLUE cardiac arrest during active Chest compressions in ICU

## 2015-08-04 NOTE — Progress Notes (Addendum)
Chaplain paged as Martin Morris was coding. Chaplain arrived shortly after Martin Morris was pronounced dead. Martin Morris's wife, Jeannene Patella and daughter were present for the code procedure and were initially traumatized by the experience. Chaplain stayed with family as they grieved the death of their loved one. Although he died during a code, family stories reveal that he was anticipating his death during the day, telling family that he was tired and did not feel he could go on longer. Family was deeply appreciative of the staff care of Martin Morris.  Martin Morris was a practicing Christian. Family indicates he was at peace spiritually and emotionally. His community of faith will be told in the morning of his death. He will be greatly missed by family and friends.   Sallee Lange. [place], [place]

## 2015-08-07 ENCOUNTER — Other Ambulatory Visit: Payer: Medicare Other

## 2015-08-07 ENCOUNTER — Ambulatory Visit: Payer: Medicare Other

## 2015-08-08 ENCOUNTER — Ambulatory Visit: Payer: Medicare Other

## 2015-08-17 ENCOUNTER — Other Ambulatory Visit: Payer: Medicare Other

## 2015-08-17 ENCOUNTER — Ambulatory Visit: Payer: Medicare Other | Admitting: Hematology and Oncology

## 2015-08-27 NOTE — Procedures (Signed)
Arrived after 1 hour acls from EDP and triad  Echo probe placed after Empiric TNK Subcostal and short parasternals done, poor images otherwise 1. Kinetic LV back EF less 5%, diffuse hypokinesis 2. RV dilated severe hypokinesis 3. Small to no pericardial effusion  Then lost contraction, declared dead  Lavon Paganini. Titus Mould, MD, [place] [facility]: [facility]

## 2015-08-27 NOTE — ED Provider Notes (Signed)
CSN: [id number]     Arrival date & time 2015-08-17  1125 History   First MD Initiated Contact with Patient August 17, 2015 1335     Chief Complaint  Patient presents with  . Shortness of Breath     (Consider location/radiation/quality/duration/timing/severity/associated sxs/prior Treatment) HPI Comments: 82 year old male with extensive past medical history including CAD status post CABG, CHF, mantle cell lymphoma, hypertension, PVD, OSA who presents with shortness of breath. Patient has had several weeks of shortness of breath that has been, much worse over the past 3 days. He had a scan last week which showed some fluid on his lungs but they did not perform a thoracentesis. His shortness of breath has continued to worsen and has been associated with random episodes of chest pain at rest. His last episode of chest pain was this morning and he denies any chest pain currently. He denies any fevers, cough/cold symptoms, vomiting, abdominal pain, or blood in his stool. He has chronic b/l LE edema that has been present for a long time.  Patient is a 82 y.o. male presenting with shortness of breath. The history is provided by the patient.  Shortness of Breath   Past Medical History  Diagnosis Date  . Hypertension   . CAD 11/12/2007    a. s/p CABG;  b. Cath 7/11 Patent LIMA to the LAD. Previously known occlusion of a saphenous vein graft to diagonal and sequential to obtuse marginals. Severe diffuse native obtuse marginal and diagonal branch vessel disease. Preserved ejection fraction.;  c. 03/2013 Lexi CL: mild inflat and inf ischemia, EF 54%->initial med rx.  Marland Kitchen HYPERLIPIDEMIA 11/12/2007  . HYPERTENSION 11/12/2007  . PERIPHERAL VASCULAR DISEASE 11/12/2007  . ARTHRITIS 11/12/2007  . BENIGN PROSTATIC HYPERTROPHY, HX OF 11/12/2007  . Edema 10/17/2010  . INGUINAL HERNIAS, BILATERAL 11/10/2006  . Obesity, unspecified 07/05/2009  . SLEEP APNEA 11/12/2007  . History of PTCA 2005  . Osteoarthritis   . Carotid  stenosis, bilateral   . Nephrolithiasis   . thymus ca dx'd 2003    Radiation comp 2003  . NHL (non-Hodgkin's lymphoma) ([place]) dx'd 2008    Chemo comp 10/2010; rituxin comp 10/2010  . 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 MANTLE CELL LYMPHOMA INTRA-ABDOMINAL LYMPH NODES 11/12/2007  . Tricuspid valve regurgitation   . Prerenal renal failure 09/20/2014  . Acute on chronic diastolic CHF (congestive heart failure) ([place]) 08/28/2014  . E. coli UTI 08/28/2014  . Fracture of femoral neck, left ([place]) 12/09/2013  . Acute blood loss anemia 12/10/2013  . Hemorrhagic shock 08/05/2014  . Syncope 08/05/2014  . Cough 11/17/2014   Past Surgical History  Procedure Laterality Date  . Coronary artery bypass graft    . Cardiac catheterization  08/2009, 02/2010  . Total hip arthroplasty Left 12/09/2013    Procedure: TOTAL HIP ARTHROPLASTY;  Surgeon: Mauri Pole, MD;  Location: [facility];  Service: Orthopedics;  Laterality: Left;  . Esophagogastroduodenoscopy N/A 08/02/2014    Procedure: ESOPHAGOGASTRODUODENOSCOPY (EGD) at bedside;  Surgeon: Irene Shipper, MD;  Location: [facility];  Service: Endoscopy;  Laterality: N/A;  . Esophagogastroduodenoscopy (egd) with propofol N/A 08/10/2014    Procedure: ESOPHAGOGASTRODUODENOSCOPY (EGD) WITH PROPOFOL;  Surgeon: Milus Banister, MD;  Location: [facility];  Service: Endoscopy;  Laterality: N/A;   Family History  Problem Relation Age of Onset  . Coronary artery disease Father     died at 33  . Heart attack Mother     died at age 38  . Heart attack Brother  Social History  Substance Use Topics  . Smoking status: Never Smoker   . Smokeless tobacco: Never Used  . Alcohol Use: No    Review of Systems  Respiratory: Positive for shortness of breath.    10 Systems reviewed and are negative for acute change except as noted in the HPI.    Allergies  Atorvastatin  Home Medications   Prior to Admission medications   Medication Sig Start Date End Date Taking? Authorizing  Provider  atenolol (TENORMIN) 25 MG tablet Take 1 tablet (25 mg total) by mouth daily. 11/14/14  Yes Minus Breeding, MD  chlorpheniramine-HYDROcodone (TUSSIONEX) 10-8 MG/5ML SUER Take 5 mLs by mouth every 12 (twelve) hours as needed for cough. 12/17/14  Yes Debbe Odea, MD  citalopram (CELEXA) 20 MG tablet TAKE ONE TABLET BY MOUTH ONCE DAILY 01/24/15  Yes Heath Lark, MD  furosemide (LASIX) 40 MG tablet Take 0.5 tablets (20 mg total) by mouth daily. 02/23/15  Yes Minus Breeding, MD  HYDROcodone-acetaminophen (NORCO/VICODIN) 5-325 MG per tablet Take 1 tablet by mouth every 6 (six) hours as needed for moderate pain. 12/17/14  Yes Debbe Odea, MD  isosorbide mononitrate (IMDUR) 30 MG 24 hr tablet Take 3 tablets (90 mg total) by mouth daily. 10/07/14  Yes Minus Breeding, MD  lactose free nutrition (BOOST PLUS) LIQD Take 237 mLs by mouth 2 (two) times daily as needed (Allow w/pt or pt's wife request after diet advancement). Patient taking differently: Take 237 mLs by mouth daily.  12/13/13  Yes Annita Brod, MD  lidocaine-prilocaine (EMLA) cream Apply to affected area once 07/31/15  Yes Heath Lark, MD  methocarbamol (ROBAXIN) 500 MG tablet Take 1 tablet (500 mg total) by mouth every 6 (six) hours as needed for muscle spasms. 12/10/13  Yes Danae Orleans, PA-C  naproxen sodium (ANAPROX) 220 MG tablet Take 220 mg by mouth 2 (two) times daily as needed.   Yes Historical Provider, MD  nitroGLYCERIN (NITROSTAT) 0.4 MG SL tablet Place 1 tablet (0.4 mg total) under the tongue every 5 (five) minutes as needed for chest pain. 01/12/14  Yes Minus Breeding, MD  ondansetron (ZOFRAN) 8 MG tablet Take 1 tablet (8 mg total) by mouth every 8 (eight) hours as needed. 07/31/15  Yes Heath Lark, MD  pantoprazole (PROTONIX) 40 MG tablet TAKE ONE TABLET BY MOUTH TWICE DAILY 07/03/15  Yes Heath Lark, MD  pravastatin (PRAVACHOL) 80 MG tablet Take 1 tablet (80 mg total) by mouth daily. 03/08/15  Yes Minus Breeding, MD  prochlorperazine  (COMPAZINE) 10 MG tablet Take 1 tablet (10 mg total) by mouth every 6 (six) hours as needed (Nausea or vomiting). 07/31/15  Yes Ni Gorsuch, MD   BP 142/99 mmHg  Pulse 76  Temp(Src) 97.7 F (36.5 C) (Oral)  Resp 20  SpO2 92% Physical Exam  Constitutional: He is oriented to person, place, and time. He appears well-developed and well-nourished. No distress.  HENT:  Head: Normocephalic and atraumatic.  Moist mucous membranes  Eyes: Conjunctivae are normal. Pupils are equal, round, and reactive to light.  Neck: Neck supple.  Cardiovascular: Normal rate, regular rhythm and normal heart sounds.   3/6 systolic murmur  Pulmonary/Chest: Effort normal and breath sounds normal.  Abdominal: Soft. Bowel sounds are normal. He exhibits no distension. There is no tenderness.  Musculoskeletal:  3+ pitting edema b/l LE  Neurological: He is alert and oriented to person, place, and time.  Fluent speech  Skin: Skin is warm and dry.  Psychiatric: He has a normal  mood and affect. Judgment normal.  Nursing note and vitals reviewed.   ED Course  .Critical Care Performed by: Sharlett Iles Authorized by: Sharlett Iles Total critical care time: 30 minutes Critical care time was exclusive of separately billable procedures and treating other patients. Critical care was necessary to treat or prevent imminent or life-threatening deterioration of the following conditions: cardiac failure. Critical care was time spent personally by me on the following activities: development of treatment plan with patient or surrogate, discussions with consultants, discussions with primary provider, examination of patient, obtaining history from patient or surrogate, ordering and performing treatments and interventions, ordering and review of radiographic studies, ordering and review of laboratory studies, pulse oximetry, re-evaluation of patient's condition and review of old charts.   (including critical care  time) Labs Review Labs Reviewed  CBC WITH DIFFERENTIAL/PLATELET - Abnormal; Notable for the following:    WBC 2.9 (*)    Hemoglobin 12.6 (*)    Neutro Abs 0.7 (*)    All other components within normal limits  COMPREHENSIVE METABOLIC PANEL - Abnormal; Notable for the following:    Glucose, Bld 111 (*)    BUN 25 (*)    Creatinine, Ser 1.64 (*)    ALT 14 (*)    Total Bilirubin 1.3 (*)    GFR calc non Af Amer 38 (*)    GFR calc Af Amer 44 (*)    All other components within normal limits  BRAIN NATRIURETIC PEPTIDE - Abnormal; Notable for the following:    B Natriuretic Peptide 1757.9 (*)    All other components within normal limits  TROPONIN I - Abnormal; Notable for the following:    Troponin I 1.33 (*)    All other components within normal limits  APTT  PROTIME-INR  HEPARIN LEVEL (UNFRACTIONATED)    Imaging Review Dg Chest 2 View  Aug 26, 2015  CLINICAL DATA:  Shortness of breath EXAM: CHEST  2 VIEW COMPARISON:  12/15/2014 chest x-ray.  07/24/2015 CT. FINDINGS: Small bilateral pleural effusion, similar to CT 07/24/2015. There is associated atelectasis, greatest along the minor fissure. No pulmonary edema. Porta catheter from the right, tip at the SVC level. Chronic mild cardiomegaly. The patient is status post CABG. Stable aortic and hilar contours. IMPRESSION: Chronic small pleural effusions with basilar atelectasis. Electronically Signed   By: Monte Fantasia M.D.   On: 08/26/2015 12:20   I have personally reviewed and evaluated these lab results as part of my medical decision-making.   EKG Interpretation None     Medications  aspirin chewable tablet 324 mg (324 mg Oral Given 08/26/15 1438)  heparin bolus via infusion 4,000 Units (4,000 Units Intravenous Given 2015-08-26 1513)  heparin ADULT infusion 100 units/mL (25000 units/250 mL) (1,200 Units/hr Intravenous New Bag/Given 2015-08-26 1513)    MDM   Final diagnoses:  NSTEMI (non-ST elevated myocardial infarction) (HCC)   Congestive heart failure, unspecified congestive heart failure chronicity, unspecified congestive heart failure type [facility])   82 year old male w/ h/o CHF, CAD, and mantle cell lymphoma presents with worsening shortness of breath. On exam, the patient was awake, alert, comfortable. Vital signs stable. Significant pitting edema bilateral lower extremities, systolic murmur noted. EKG showed ST depression in lateral leads but no ST elevation. Gave the patient aspirin and obtained above lab work as well as chest x-ray. Chest x-ray showed small bilateral pleural effusions but no significant change. Labs notable for troponin of 1.3, BNP is 1757. Creatinine 1.64  therefore I'm not able to obtain a  contrasted study to rule out PE as cause of heart strain. Patient denies any blood in his stool, therefore initiated heparin per ACS protocol. I considered a V/Q scan but feel that this may be nondiagnostic given the patient's cancer history. Consulted cardiology who will evaluate the patient. Discussed patient with [facility] hospitalist, Dr. Charlies Silvers, and patient admitted for further care.    Sharlett Iles, MD 08-04-2015 [phone]

## 2015-08-27 NOTE — Progress Notes (Signed)
No breath sounds auscultated an no pulse palpated. Time of death pronounced at 2345.

## 2015-08-27 NOTE — ED Notes (Signed)
Pt getting chemo for lymphoma and ? Lung.  Pt getting treatment.  Pt had scan on Friday and told he has "fluid on his lungs".  Did not want to "draw it off" at that time.  Pt states shortness of breath getting worse.

## 2015-08-27 NOTE — H&P (Addendum)
[facility] History and Physical  [facility] [id number] DOB: 1933-06-16 DOA: 08-10-2015  Referring physician: ER physician" Dr. Theotis [facility]  PCP: Martin Mustache, MD  Chief Complaint: shortness of breath   HPI:  82 year old male with past medical history of CAD s/p CABG, hypertension, chronic diastolic CHF, mantle cell lymphoma diagnosed in March 2008, obstructive liver abnormalities requiring ERCP and non metal stent placement by Dr. Erskine Morris (stent was subsequently removed in October 2008). Pt is s/p chemotherapy (under Dr. Calton Morris care). He has GI bleed from malignant tumor in the stomach and he underwent cauterization and biopsy at that time showed recurrence of lymphoma. Additionally, his PET scan from 07/2014 showed progression of the disease. Pt presented to [facility] with repots of worsening shortness of breath at rest and with exertion over past 3 days prior to this admission. His family says he keeps saying he hurts all over. He sounds very congested and get extremely short of breath even with slight exertion. He also reported associated chest pain with exertion and with rest in mid sternal area, intermittent, 6/10 in intensity. No associated fevers but family says he did have some chills. He also had cough productive of yellowish sputum. He has leg swelling which is chronic. No abdominal pain, nausea or vomiting. No blood in stool or urine. No lightheadedness or loss of consciousness.   In ED, pt was hemodynamically stable but appeared to be uncomfortable because of shortness of breath. Oxygen saturation was 92% with [facility] oxygen support. Blood work showed WBC count of 2.9, Cr 1.64 (baseline 1.5), the troponin level was 1.33. CXR showed small bilateral pleural effusion. He was started empirically on Levaquin for possible pneumonia. Due to respiratory distress he was admitted to SDU.  Assessment & Plan    Principal Problem:   Acute respiratory failure with  hypoxia (HCC) / Acute diastolic CHF (congestive heart failure) (HCC) - Likely due to acute diastolic CHF - CHF order set placed - BNP on the admission 1758 - 2 D ECHO in  11/2014 showed EF of 50% and grade 1 diastolic dysfunction - Given lasix 40 mg IV in ED - He is on atenolol  - Will follow up with cardio if plan is to continue diuresis (Cr is slightly above baseline) - Continue daily weight and strict intake and output - Replete electrolytes   Active Problems:   Bilateral pleural effusion - Likely due to CHF but will see if IR can do thoracentesis and if ok then will sent fluid for analysis including cytology    Possible lobar pneumonia - Started empiric Levaquin - He has WBC count of 2.9 and no fevers but family reported cough productive of sputum, chills so it is reasonable too treat with abx    Elevated troponin - Likely demand ischemia from acute decompensated CHF - Appreciate cardiology recommendations - Pt on heparin drip - Cycle cardiac enzymes - Continue pain management for chest pain    Essential hypertension - Continue atenolol, imdur     Mantle cell lymphoma of lymph nodes of multiple regions ([facility]) / NHL (non-Hodgkin's lymphoma) ([facility]) - Will let Dr. Alvy Morris know of patient's admission     Anemia in neoplastic disease / Leukopenia due to antineoplastic chemotherapy - Hemoglobin is stable - WBC count is 2.9 with abs neutrophils 0.7. WIll see with Dr. Alvy Morris if filgrastim indicated       Acute renal failure superimposed on stage 3 chronic kidney disease ([facility]) - Likely due to lasix -  He was given IV lasix in ED for decompensated CHF. Will see if cardio plans to continue lasix IV. - Will monitor renal function    Dyslipidemia - Resume Pravachol       Malnutrition of moderate degree (HCC) - In the context of chronic illness - Nutrition consulted   DVT prophylaxis:  - On heparin drip   Radiological Exams on Admission: Dg Chest 2 View 08-Aug-2015   Chronic  small pleural effusions with basilar atelectasis. Electronically Signed   By: [facility] M.D.   On: 08-Aug-2015 12:20    EKG: I have personally reviewed EKG. EKG shows sinus rhythm   Code Status: Full Family Communication: Plan of care discussed with the patient  Disposition Plan: Admit for further evaluation, telemetry   [facility], Martin Skeens, MD  [facility] Pager [phone]  Time spent in minutes: 75 minutes  Review of Systems:  Constitutional: Negative for fever, chills and positive for malaise/fatigue. Negative for diaphoresis.  HENT: Negative for hearing loss, ear pain, nosebleeds, congestion, sore throat, neck pain, tinnitus and ear discharge.   Eyes: Negative for blurred vision, double vision, photophobia, pain, discharge and redness.  Respiratory: per HPI.   Cardiovascular: positive for  for chest pain, no palpitations, orthopnea, claudication and positive for leg swelling.  Gastrointestinal: Negative for nausea, vomiting and abdominal pain. Negative for heartburn, constipation, blood in stool and melena.  Genitourinary: Negative for dysuria, urgency, frequency, hematuria and flank pain.  Musculoskeletal: Negative for myalgias, back pain, joint pain and falls.  Skin: Negative for itching and rash.  Neurological: Negative for dizziness and weakness. Negative for tingling, tremors, sensory change, speech change, focal weakness, loss of consciousness and headaches.  Endo/Heme/Allergies: Negative for environmental allergies and polydipsia. Does not bruise/bleed easily.  Psychiatric/Behavioral: Negative for suicidal ideas. The patient is not nervous/anxious.      History reviewed. No pertinent past medical history. Past Surgical History  Procedure Laterality Date  . Coronary artery bypass graft    . Cardiac catheterization  08/2009, 02/2010  . Total hip arthroplasty Left 12/09/2013    Procedure: TOTAL HIP ARTHROPLASTY;  Surgeon: Martin Pole, MD;  Location: [facility];  Service:  Orthopedics;  Laterality: Left;  . Esophagogastroduodenoscopy N/A 08/02/2014    Procedure: ESOPHAGOGASTRODUODENOSCOPY (EGD) at bedside;  Surgeon: Martin Shipper, MD;  [facility]: [facility];  Service: Endoscopy;  Laterality: N/A;  . Esophagogastroduodenoscopy (egd) with propofol N/A 08/10/2014    Procedure: ESOPHAGOGASTRODUODENOSCOPY (EGD) WITH PROPOFOL;  Surgeon: Milus Banister, MD;  Location: [facility];  Service: Endoscopy;  Laterality: N/A;   Social History:  reports that he has never smoked. He has never used smokeless tobacco. He reports that he does not drink alcohol or use illicit drugs.  Allergies  Allergen Reactions  . Atorvastatin Other (See Comments)    Incredible dizziness    Family History:  Family History  Problem Relation Age of Onset  . Coronary artery disease Father     died at 39  . Heart attack Mother     died at age 44  . Heart attack Brother      Prior to Admission medications   Medication Sig Start Date End Date Taking? Authorizing Provider  atenolol (TENORMIN) 25 MG tablet Take 1 tablet (25 mg total) by mouth daily. 11/14/14  Yes Minus Breeding, MD  chlorpheniramine-HYDROcodone (TUSSIONEX) 10-8 MG/5ML SUER Take 5 mLs by mouth every 12 (twelve) hours as needed for cough. 12/17/14  Yes Debbe Odea, MD  citalopram (CELEXA) 20 MG tablet TAKE  ONE TABLET BY MOUTH ONCE DAILY 01/24/15  Yes Heath Lark, MD  furosemide (LASIX) 40 MG tablet Take 0.5 tablets (20 mg total) by mouth daily. 02/23/15  Yes Minus Breeding, MD  HYDROcodone-acetaminophen (NORCO/VICODIN) 5-325 MG per tablet Take 1 tablet by mouth every 6 (six) hours as needed for moderate pain. 12/17/14  Yes Debbe Odea, MD  isosorbide mononitrate (IMDUR) 30 MG 24 hr tablet Take 3 tablets (90 mg total) by mouth daily. 10/07/14  Yes Minus Breeding, MD  lactose free nutrition (BOOST PLUS) LIQD Take 237 mLs by mouth 2 (two) times daily as needed (Allow w/pt or pt's wife request after diet advancement). Patient taking  differently: Take 237 mLs by mouth daily.  12/13/13  Yes Annita Brod, MD  lidocaine-prilocaine (EMLA) cream Apply to affected area once 07/31/15  Yes Heath Lark, MD  methocarbamol (ROBAXIN) 500 MG tablet Take 1 tablet (500 mg total) by mouth every 6 (six) hours as needed for muscle spasms. 12/10/13  Yes Danae Orleans, PA-C  naproxen sodium (ANAPROX) 220 MG tablet Take 220 mg by mouth 2 (two) times daily as needed.   Yes Historical Provider, MD  nitroGLYCERIN (NITROSTAT) 0.4 MG SL tablet Place 1 tablet (0.4 mg total) under the tongue every 5 (five) minutes as needed for chest pain. 01/12/14  Yes Minus Breeding, MD  ondansetron (ZOFRAN) 8 MG tablet Take 1 tablet (8 mg total) by mouth every 8 (eight) hours as needed. 07/31/15  Yes Heath Lark, MD  pantoprazole (PROTONIX) 40 MG tablet TAKE ONE TABLET BY MOUTH TWICE DAILY 07/03/15  Yes Heath Lark, MD  pravastatin (PRAVACHOL) 80 MG tablet Take 1 tablet (80 mg total) by mouth daily. 03/08/15  Yes Minus Breeding, MD  prochlorperazine (COMPAZINE) 10 MG tablet Take 1 tablet (10 mg total) by mouth every 6 (six) hours as needed (Nausea or vomiting). 07/31/15  Yes Heath Lark, MD   Physical Exam: Filed Vitals:   September 01, 2015 1132 September 01, 2015 1346 09-01-2015 1400  BP: 133/80 144/99 142/99  Pulse: 72 74 76  Temp: 97.7 F (36.5 C)    TempSrc: Oral    Resp: 22 16 20   SpO2: 93% 92% 92%    Physical Exam  Constitutional: Appears well-developed and well-nourished. No distress.  HENT: Normocephalic. No tonsillar erythema or exudates Eyes: Conjunctivae are normal. No scleral icterus.  Neck: Normal ROM. Neck supple. No JVD. No tracheal deviation. No thyromegaly.  CVS: RRR, S1/S2 appreciated  Pulmonary: very rhonchorous, wheezing in upper lung lobes, diminished breath sounds .  Abdominal: Soft. BS +,  no distension, tenderness, rebound or guarding.  Musculoskeletal: Normal range of motion. (+) LE edema but no tenderness.  Lymphadenopathy: No lymphadenopathy noted,  cervical, inguinal. Neuro: Alert. Normal reflexes, muscle tone coordination. No focal neurologic deficits. Skin: Skin is warm and dry. No rash noted.  No erythema. No pallor.  Psychiatric: Normal mood and affect. Behavior, judgment, thought content normal.   Labs on Admission:  Basic Metabolic Panel:  Recent Labs Lab 07/31/15 1340 09-01-2015 1151  NA 142 143  K 4.0 4.5  CL  --  105  CO2 25 27  GLUCOSE 148* 111*  BUN 20.5 25*  CREATININE 1.5* 1.64*  CALCIUM 9.1 9.1   Liver Function Tests:  Recent Labs Lab 07/31/15 1340 September 01, 2015 1151  AST 16 25  ALT <9 14*  ALKPHOS 113 104  BILITOT 1.10 1.3*  PROT 6.4 6.6  ALBUMIN 3.4* 3.8   No results for input(s): LIPASE, AMYLASE in the last 168 hours. No results  for input(s): AMMONIA in the last 168 hours. CBC:  Recent Labs Lab 07/31/15 1340 2015/08/28 1151  WBC 2.1* 2.9*  NEUTROABS 0.5* 0.7*  HGB 12.6* 12.6*  HCT 39.4 40.2  MCV 92.1 93.9  PLT 174 198   Cardiac Enzymes:  Recent Labs Lab 08/28/15 1151  TROPONINI 1.33*   BNP: Invalid input(s): POCBNP CBG: No results for input(s): GLUCAP in the last 168 hours.  If 7PM-7AM, please contact night-coverage [email or web address] Password [email or web address] 28-Aug-2015, 4:14 PM

## 2015-08-27 NOTE — Progress Notes (Signed)
At approximately 2220, pt started having complaints of stabbing right shoulder pain. Pt denies  pain at other sites on his body. Pt assisted to the side of the bed with his feet dangling to see if some of the pain would be relieved. Pt also assisted with using the urinal at this time. Pt given one vicodin for pain and was assisted back to bed so that a heating pack could be placed on the right shoulder to relieve discomfort. Once positioned back in the bed pt was able to turn a little on the left side and became unresponsive. Pt spit up water and then began having agonal breathing. Faint pulse was felt initially. Pt still remained unresponsive. Chest compressions initiated at 2242 and code was called at this time.

## 2015-08-27 NOTE — Telephone Encounter (Signed)
Wife reports pt refused to go to ED yesterday.  He is scared if he goes in he might not come back out.  He continues to have difficulty breathing,  Especially w/ lying down.  He had to sleep in recliner last night.  She asked about getting Oxygen in the home?   She also asked about Hospice.  We discussed oxygen is not going to help much if his SOB is from "fluid" around his lungs.  The fluid may need to be drained.  Also explained Hospice is an option if pt decides to NOT have any further chemotherapy.  Medicare will not pay for Hospice Care and Chemotherapy.   Wife verbalized understanding. Notified Dr. Alvy Bimler of pt's difficulty breathing and his not wanting to go to hospital.  Dr. Alvy Bimler instructed pt needs to go to ED,  He most likely needs Thoracentesis at this point.  This is not something that will get better on it's own and pt will not be able to get Chemo on Monday if he doesn't go to the hospital and have this taken care of..   Informed wife what Dr. Alvy Bimler said and strongly urged her to take pt to ED at [facility] would be he can be "drained" and will be ok for chemo on Monday.  IF the chemo works then we hope it may help control or stop the fluid from building up around his lungs,  But at this point he needs to go to ED.  Wife verbalized understanding,  States she will take pt to ED at [facility].

## 2015-08-27 NOTE — Progress Notes (Signed)
ANTIBIOTIC CONSULT NOTE - INITIAL  Pharmacy Consult for Levaquin Indication: CAP  Allergies  Allergen Reactions  . Atorvastatin Other (See Comments)    Incredible dizziness    Patient Measurements:   Weight 89.5 kg (07/31/15)  Vital Signs: Temp: 97.7 F (36.5 C) (12/08 1132) Temp Source: Oral (12/08 1132) BP: 103/81 mmHg (12/08 1816) Pulse Rate: 143 (12/08 1700)  Labs:  Recent Labs  2015-08-29 1151  WBC 2.9*  HGB 12.6*  PLT 198  CREATININE 1.64*   Estimated Creatinine Clearance: 39.7 mL/min (by C-G formula based on Cr of 1.64).  Microbiology: No results found for this or any previous visit (from the past 720 hour(s)).  Medical History: History reviewed. No pertinent past medical history.  Medications:  Anti-infectives    Start     Dose/Rate Route Frequency Ordered Stop   2015/08/29 2000  levofloxacin (LEVAQUIN) IVPB 750 mg     750 mg 100 mL/hr over 90 Minutes Intravenous Every 48 hours 08/29/15 1855       Assessment: 24 yoM presents to ED with SOB and elevated troponin. PMH includes lymphoma (Ibrutinib recently discontinued d/t disease progression), CKD-II, and PET CT showed disease progression and new bilateral pleural effusions on 07/24/15.  CXR today again shows pleural effusions with associated atelectasis.  Pharmacy is consulted to dose Levaquin for CAP.  Today, 2015/08/29: Afebrile WBC 2.9, ANC 0.7 SCr 1.64 with CrCl ~ 39 ml/min  Antimicrobials this admission: 12/8 >> Levaquin >>  Microbiology Results: 12/8 MRSA PCR: sent  Goal of Therapy:  Appropriate abx dosing, eradication of infection.   Plan:  Levaquin 750mg  IV q48h. Follow up renal fxn, culture results, and clinical course.  Gretta Arab PharmD, BCPS Pager [phone] 2015/08/29 7:01 PM

## 2015-08-27 NOTE — Progress Notes (Signed)
ANTICOAGULATION CONSULT NOTE - Initial Consult  Pharmacy Consult for Heparin Indication: chest pain/ACS  Allergies  Allergen Reactions  . Atorvastatin Other (See Comments)    Incredible dizziness    Patient Measurements:    Weight 89.5 kg (07/31/15) Heparin Dosing Weight:   Vital Signs: Temp: 97.7 F (36.5 C) (12/08 1132) Temp Source: Oral (12/08 1132) BP: 144/99 mmHg (12/08 1346) Pulse Rate: 74 (12/08 1346)  Labs:  Recent Labs  08-20-15 1151  HGB 12.6*  HCT 40.2  PLT 198  CREATININE 1.64*  TROPONINI 1.33*    Estimated Creatinine Clearance: 39.7 mL/min (by C-G formula based on Cr of 1.64).   Medical History: Past Medical History  Diagnosis Date  . Hypertension   . CAD 11/12/2007    a. s/p CABG;  b. Cath 7/11 Patent LIMA to the LAD. Previously known occlusion of a saphenous vein graft to diagonal and sequential to obtuse marginals. Severe diffuse native obtuse marginal and diagonal branch vessel disease. Preserved ejection fraction.;  c. 03/2013 Lexi CL: mild inflat and inf ischemia, EF 54%->initial med rx.  Marland Kitchen HYPERLIPIDEMIA 11/12/2007  . HYPERTENSION 11/12/2007  . PERIPHERAL VASCULAR DISEASE 11/12/2007  . ARTHRITIS 11/12/2007  . BENIGN PROSTATIC HYPERTROPHY, HX OF 11/12/2007  . Edema 10/17/2010  . INGUINAL HERNIAS, BILATERAL 11/10/2006  . Obesity, unspecified 07/05/2009  . SLEEP APNEA 11/12/2007  . History of PTCA 2005  . Osteoarthritis   . Carotid stenosis, bilateral   . Nephrolithiasis   . thymus ca dx'd 2003    Radiation comp 2003  . NHL (non-Hodgkin's lymphoma) ([place]) dx'd 2008    Chemo comp 10/2010; rituxin comp 10/2010  . 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 MANTLE CELL LYMPHOMA INTRA-ABDOMINAL LYMPH NODES 11/12/2007  . Tricuspid valve regurgitation   . Prerenal renal failure 09/20/2014  . Acute on chronic diastolic CHF (congestive heart failure) ([place]) 08/28/2014  . E. coli UTI 08/28/2014  . Fracture of femoral neck, left ([place]) 12/09/2013  . Acute blood loss anemia  12/10/2013  . Hemorrhagic shock 08/05/2014  . Syncope 08/05/2014  . Cough 11/17/2014    Medications:  Scheduled:  . aspirin  324 mg Oral Once   Infusions:    Assessment: 42 yoM presents to ED with SOB and elevated troponin.  PMH includes lymphoma on chemotherapy, and CKD-II.  No anticoagulants PTA.  Pharmacy is consulted to dose Heparin for ACS.  Today, 08-20-15: SCr 1.64 with CrCl ~ 40 m/min Troponin: 1.33 CBC: Hgb 12.6, Plt 198  Goal of Therapy:  Heparin level 0.3-0.7 units/ml Monitor platelets by anticoagulation protocol: Yes   Plan:  Give 4000 units bolus x 1 Start heparin infusion at 1200 units/hr Check anti-Xa level in 8 hours and daily while on heparin Continue to monitor H&H and platelets  Gretta Arab PharmD, BCPS Pager [phone] 2015-08-20 2:41 PM

## 2015-08-27 NOTE — ED Provider Notes (Addendum)
[facility]  Department of Emergency Medicine   Code Blue CONSULT NOTE  Chief Complaint: Cardiac arrest/unresponsive   Level V Caveat: Unresponsive  History of present illness: I was contacted by the hospital for a CODE BLUE cardiac arrest upstairs and presented to the patient's bedside.  Patient was admitted earlier today for CHF exacerbation vs NSTEMI vs pna.  Patient had sudden R shoulder pain and then became unresponsive.  By the time I saw the patient he had two cycles of CPR, initially per report   ROS: Unable to obtain, Level V caveat  Scheduled Meds: . atenolol  25 mg Oral Daily  . citalopram  20 mg Oral Daily  . [START ON 08/04/2015] isosorbide mononitrate  90 mg Oral Daily  . [START ON 08/04/2015] lactose free nutrition  237 mL Oral Daily  . levofloxacin (LEVAQUIN) IV  750 mg Intravenous Q48H  . pantoprazole  40 mg Oral BID  . pravastatin  80 mg Oral Daily  . sodium chloride  3 mL Intravenous Q12H  . sodium chloride  3 mL Intravenous Q12H  . tenecteplase  45 mg Intravenous Once   Continuous Infusions: . sodium chloride    . epinephrine    . heparin     PRN Meds:.sodium chloride, sodium chloride, acetaminophen, chlorpheniramine-HYDROcodone, HYDROcodone-acetaminophen, methocarbamol, naproxen, ondansetron (ZOFRAN) IV, prochlorperazine, sodium chloride, sodium chloride History reviewed. No pertinent past medical history. Past Surgical History  Procedure Laterality Date  . Coronary artery bypass graft    . Cardiac catheterization  08/2009, 02/2010  . Total hip arthroplasty Left 12/09/2013    Procedure: TOTAL HIP ARTHROPLASTY;  Surgeon: Mauri Pole, MD;  Location: [facility] ORS;  Service: Orthopedics;  Laterality: Left;  . Esophagogastroduodenoscopy N/A 08/02/2014    Procedure: ESOPHAGOGASTRODUODENOSCOPY (EGD) at bedside;  Surgeon: Irene Shipper, MD;  Location: [facility] ENDOSCOPY;  Service: Endoscopy;  Laterality: N/A;  . Esophagogastroduodenoscopy (egd) with propofol N/A  08/10/2014    Procedure: ESOPHAGOGASTRODUODENOSCOPY (EGD) WITH PROPOFOL;  Surgeon: Milus Banister, MD;  Location: [facility] ENDOSCOPY;  Service: Endoscopy;  Laterality: N/A;   Social History   Social History  . Marital Status: Married    Spouse Name: Kayler Kosky  . Number of Children: 2  . Years of Education: N/A   Occupational History  . Retired Warehouse manager    Social History Main Topics  . Smoking status: Never Smoker   . Smokeless tobacco: Never Used  . Alcohol Use: No  . Drug Use: No  . Sexual Activity: Not on file   Other Topics Concern  . Not on file   Social History Narrative   The patient lives in [place] with his wife. He is a retired Engineer, agricultural. No tobacco or alcohol use.  Ambulates with a walker.     Allergies  Allergen Reactions  . Atorvastatin Other (See Comments)    Incredible dizziness    Last set of Vital Signs (not current) Filed Vitals:   08/10/15 1816 08-10-15 2000  BP: 103/81   Pulse:    Temp:  99.7 F (37.6 C)  Resp: 19       Physical Exam Gen: unresponsive Cardiovascular: pulseless  Resp: apneic. Breath sounds equal bilaterally with bagging  Abd: nondistended  Neuro: GCS 3, unresponsive to pain  HEENT: No blood in posterior pharynx, gag reflex absent  Neck: No crepitus  Musculoskeletal: No deformity  Skin: warm    EMERGENCY DEPARTMENT [place] CARDIAC EXAM "Study: Limited Ultrasound of the heart and pericardium"  INDICATIONS:Cardiac arrest Multiple views  of the heart and pericardium are obtained with a multi-frequency probe.  PERFORMED [id number]  IMAGES ARCHIVED?: No  FINDINGS: No pericardial effusion, No cardiac activity and No coordinated contractionis  LIMITATIONS:  Emergent procedure  VIEWS USED: Subcostal 4 chamber  INTERPRETATION: Cardiac activity absent  COMMENT:  Done during CPR, difficult with compressions, patient with no noted cardiac activity   CRITICAL CARE Performed by: Cecilio Asper Total critical care time: 75  min Critical care time was exclusive of separately billable procedures and treating other patients. Critical care was necessary to treat or prevent imminent or life-threatening deterioration. Critical care was time spent personally by me on the following activities: development of treatment plan with patient and/or surrogate as well as nursing, discussions with consultants, evaluation of patient's response to treatment, examination of patient, obtaining history from patient or surrogate, ordering and performing treatments and interventions, ordering and review of laboratory studies, ordering and review of radiographic studies, pulse oximetry and re-evaluation of patient's condition.  Cardiopulmonary Resuscitation (CPR) Procedure Note Directed/Performed by: Cecilio Asper I personally directed ancillary staff and/or performed CPR in an effort to regain return of spontaneous circulation and to maintain cardiac, neuro and systemic perfusion.    Medical Decision making  Patient was unresponsive with pos trop concern for possible acute MI.   Intubate by anesthesia.Having active ROSC every time he received sufficient chest compressions.  Due to this prolonged CPR was performed.  Patient continued to localize to the compressions, but would lose pulses whenever they were stopped. Started on epi gtt, iv fluids for possible hypovolemia or profound hypotension.  obtained ROSC x2, for about 3 min or less.  ECG with no overt signs of STEMI.  Bedside US with global hypo activity with dilated RV.   Given Bicarb and ca 2+ due to prolonged CPR. Decision was made with discussion with the wife to try thrombolytic therapy.  Check list without contraindication.  Given thrombolytics.   Assessment and Plan Turned over care to Dr. Titus Mould with poor likelihood of survival with prolonged CPR.  Awaiting circulation of thrombolytic therapy to eval for possible ROSC.     Deno Etienne, DO 08/04/15 [place],  DO 08/17/15 [phone]

## 2015-08-27 NOTE — Procedures (Signed)
Called to Code Blue.  Patient in Full Arrest, CPR in progress, Ambu and mask ventilation in process.  Took over mask ventilation as equipment for intubation was being obtained.  Intubated during CPR with MAC 3 blade and 7.5 ET tube.  Taped at 21cm at teeth.  Positive CO2 indicated.  BS heard bilateraly.  CXR pending

## 2015-08-27 DEATH — deceased

## 2015-09-27 NOTE — Discharge Summary (Signed)
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 [facility])   Essential hypertension   Anemia in neoplastic disease   Malnutrition of moderate degree (HCC)   Acute renal failure superimposed on stage 3 chronic kidney disease (HCC)   Leukopenia due to antineoplastic chemotherapy   NHL (non-Hodgkin's lymphoma) (HCC)   Dyslipidemia   Bilateral pleural effusion   Lobar pneumonia, unspecified organism [facility])  History of present illness:    Hospital Course:  Per HPI "82 year old male with past medical history of CAD s/p CABG, hypertension, chronic diastolic CHF, mantle cell lymphoma diagnosed in March 2008, obstructive liver abnormalities requiring ERCP and non metal stent placement by Martin Morris (stent was subsequently removed in October 2008). Pt is s/p chemotherapy (under Martin Morris care). He has GI bleed from malignant tumor in the stomach and he underwent cauterization and biopsy at that time showed recurrence of lymphoma. Additionally, his PET scan from 07/2014 showed progression of the disease. Pt presented to [facility] ED with repots of worsening shortness of breath at rest and with exertion over past 3 days prior to this admission. His family says he keeps saying he hurts all over. He sounds very congested and get extremely short of breath even with slight exertion. He also reported associated chest pain with exertion and with rest in mid sternal area, intermittent, 6/10 in intensity. No associated fevers but family says he did have some chills. He also had cough productive of yellowish sputum. He has leg swelling which is  chronic. No abdominal pain, nausea or vomiting. No blood in stool or urine. No lightheadedness or loss of consciousness.   In ED, pt was hemodynamically stable but appeared to be uncomfortable because of shortness of breath. Oxygen saturation was 92% with [place] oxygen support. Blood work showed WBC count of 2.9, Cr 1.64 (baseline 1.5), the troponin level was 1.33. CXR showed small bilateral pleural effusion. He was started empirically on Levaquin for possible pneumonia. Due to respiratory distress he was admitted to SDU."  Pt coded around 10-11 pm and pronounced expired 23:45pm. I was not present at the time of the expiration.   Assessment & Plan    Principal Problem:  Acute respiratory failure with hypoxia (HCC) / Acute diastolic CHF (congestive heart failure) (HCC) - Likely due to acute diastolic CHF - BNP on the admission 1758 - 2 D ECHO in 11/2014 showed EF of 50% and grade 1 diastolic dysfunction - Given lasix 40 mg IV in ED  Active Problems:  Bilateral pleural effusion - Likely due to CHF but plan was for IR to do thoracentesis in am   Possible lobar pneumonia - Started empiric Levaquin - He has WBC count of 2.9 and no fevers but family reported cough productive of sputum, chills so it was reasonable too treat with abx   Elevated troponin - Likely demand ischemia from acute decompensated CHF - Pt started on heparin drip - Cycled cardiac enzymes   Essential hypertension - Was on atenolol, imdur    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 Alvy Morris aware of patient's admission    Anemia  in neoplastic disease / Leukopenia due to antineoplastic chemotherapy - Hemoglobin was stable     Acute renal failure superimposed on stage 3 chronic kidney disease (HCC) - Likely due to lasix - He was given IV lasix in ED for decompensated CHF.    Dyslipidemia - On Pravachol     Malnutrition of moderate degree  (HCC) - In the context of chronic illness  DVT prophylaxis:  - On heparin drip   Radiological Exams on Admission: Dg Chest 2 View 2015-08-22 Chronic small pleural effusions with basilar atelectasis. Electronically Signed By: Martin Morris M.D. On: 08/22/15 12:20    Time: 23:45  2015/08/22  Signed:  Leisa Morris  [facility] 08/31/2015, 10:11 PM

## 2016-11-28 IMAGING — DX DG CHEST 1V PORT
1 series · 1 of 1 positions shown · non-contrast
Comparison: 08/28/2014 chest x-ray.  07/27/2014 PET-CT.

CLINICAL DATA: 81-year-old male with shortness of breath. History
of hypertension, coronary artery disease, and lymphoma. Subsequent
encounter.

EXAM:
PORTABLE CHEST - 1 VIEW

[chest ap]
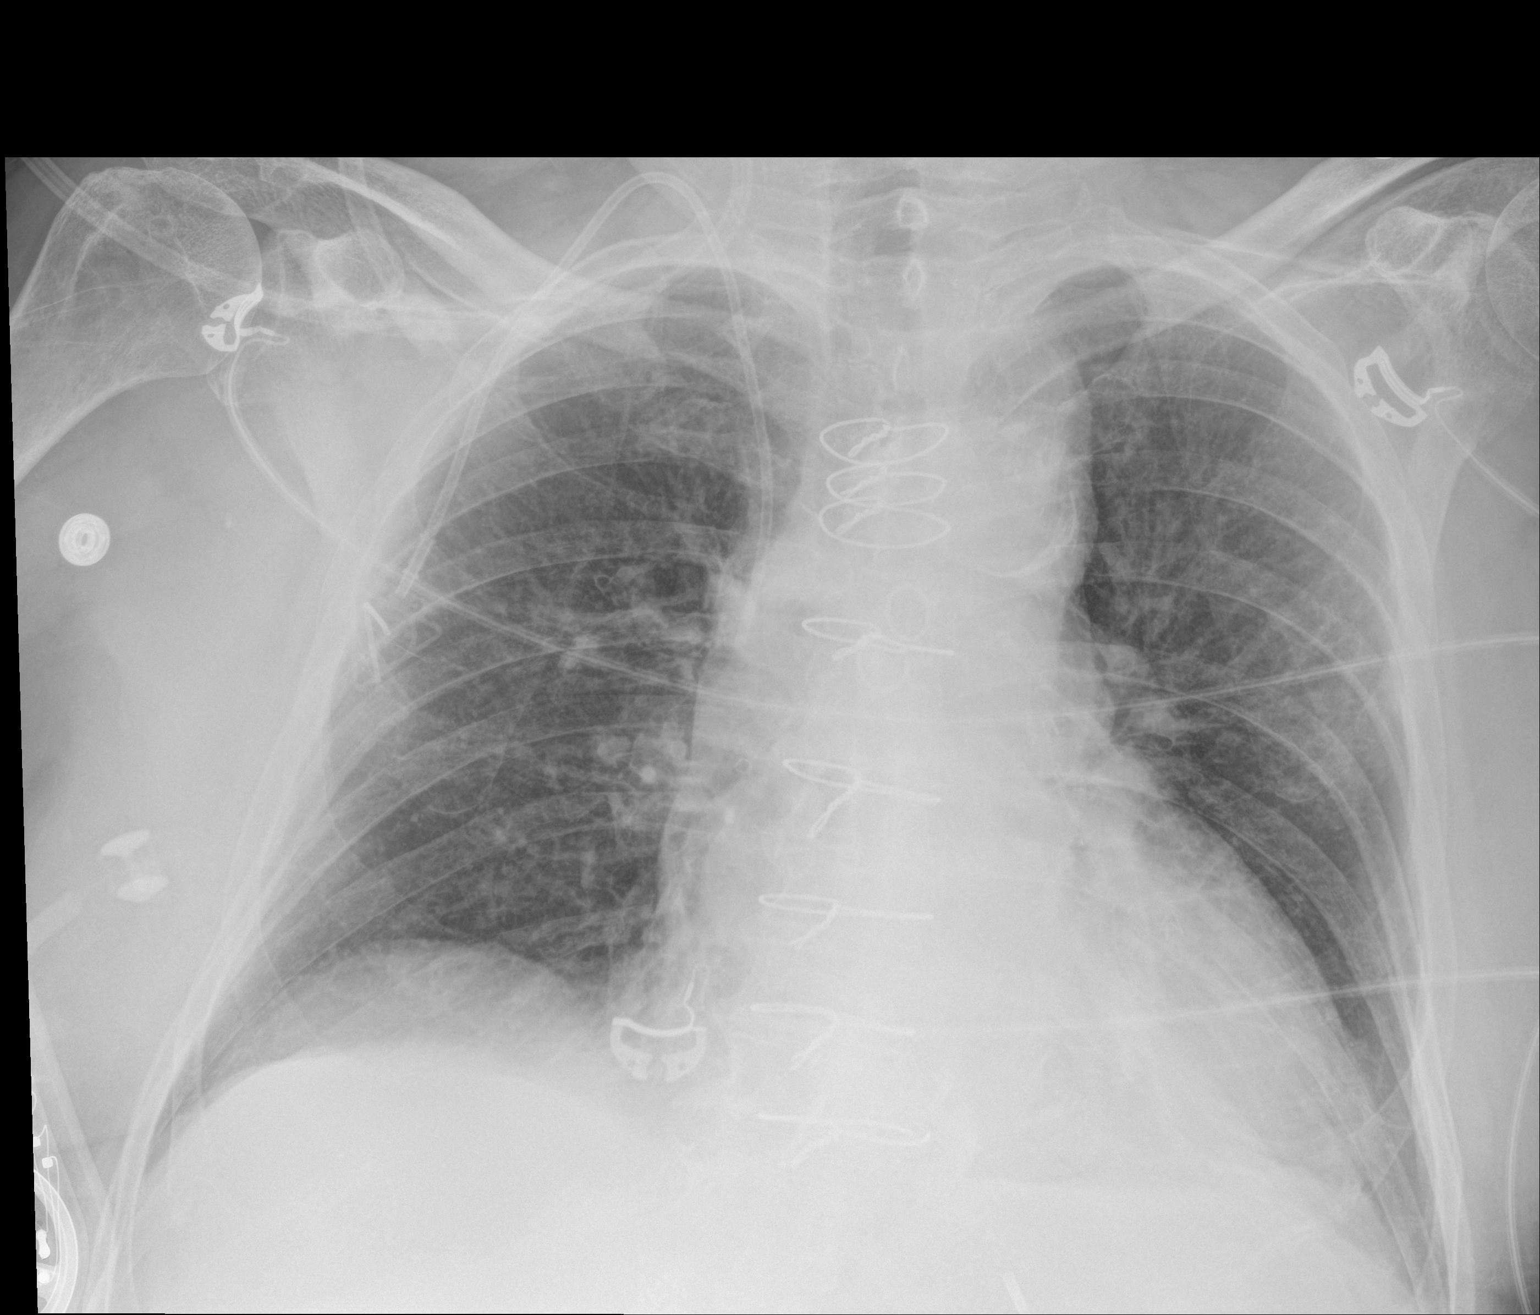

[1 of 1 positions shown; findings below may reference images not displayed]

FINDINGS: Right-sided MediPort catheter tip proximal superior vena cava level.

Post CABG.  Cardiomegaly.

Mild pulmonary vascular prominence.

No gross pneumothorax.

PET CT detected pulmonary parenchymal changes which demonstrated FDG
uptake not as well delineated on the present plain film examination.
Left base parenchymal changes stable.

Calcified tortuous aorta.  Coronary artery calcifications.
IMPRESSION: Post CABG.  Cardiomegaly.

Mild pulmonary vascular prominence.

PET CT detected pulmonary parenchymal changes which demonstrated FDG
uptake not as well delineated on the present plain film examination.
Left base parenchymal changes appear stable.
